# Patient Record
Sex: Male | Born: 1940 | Race: White | Hispanic: No | Marital: Married | Smoking: Never smoker
Health system: Southern US, Community
[De-identification: ages and names within clinical notes are randomized; demographics above are authoritative.]

## PROBLEM LIST (undated history)

## (undated) DIAGNOSIS — I428 Other cardiomyopathies: Secondary | ICD-10-CM

## (undated) DIAGNOSIS — C801 Malignant (primary) neoplasm, unspecified: Secondary | ICD-10-CM

## (undated) DIAGNOSIS — K429 Umbilical hernia without obstruction or gangrene: Secondary | ICD-10-CM

## (undated) DIAGNOSIS — Z923 Personal history of irradiation: Secondary | ICD-10-CM

## (undated) DIAGNOSIS — F40240 Claustrophobia: Secondary | ICD-10-CM

## (undated) DIAGNOSIS — G4733 Obstructive sleep apnea (adult) (pediatric): Secondary | ICD-10-CM

## (undated) DIAGNOSIS — I499 Cardiac arrhythmia, unspecified: Secondary | ICD-10-CM

## (undated) DIAGNOSIS — Z9289 Personal history of other medical treatment: Secondary | ICD-10-CM

## (undated) DIAGNOSIS — B029 Zoster without complications: Secondary | ICD-10-CM

## (undated) DIAGNOSIS — I509 Heart failure, unspecified: Secondary | ICD-10-CM

## (undated) DIAGNOSIS — I4891 Unspecified atrial fibrillation: Secondary | ICD-10-CM

## (undated) DIAGNOSIS — I879 Disorder of vein, unspecified: Secondary | ICD-10-CM

## (undated) DIAGNOSIS — E785 Hyperlipidemia, unspecified: Secondary | ICD-10-CM

## (undated) DIAGNOSIS — I519 Heart disease, unspecified: Secondary | ICD-10-CM

## (undated) DIAGNOSIS — R413 Other amnesia: Secondary | ICD-10-CM

## (undated) DIAGNOSIS — Z95810 Presence of automatic (implantable) cardiac defibrillator: Secondary | ICD-10-CM

## (undated) HISTORY — DX: Other cardiomyopathies: I42.8

## (undated) HISTORY — PX: CYST EXCISION: SHX5701

## (undated) HISTORY — DX: Unspecified atrial fibrillation: I48.91

## (undated) HISTORY — DX: Obstructive sleep apnea (adult) (pediatric): G47.33

## (undated) HISTORY — PX: CATARACT EXTRACTION: SUR2

## (undated) HISTORY — DX: Hyperlipidemia, unspecified: E78.5

## (undated) HISTORY — DX: Heart disease, unspecified: I51.9

## (undated) HISTORY — PX: HERNIA REPAIR: SHX51

## (undated) HISTORY — PX: EYE SURGERY: SHX253

## (undated) HISTORY — DX: Personal history of other medical treatment: Z92.89

## (undated) HISTORY — PX: MOUTH SURGERY: SHX715

---

## 1996-09-06 DIAGNOSIS — I428 Other cardiomyopathies: Secondary | ICD-10-CM

## 1996-09-06 HISTORY — DX: Other cardiomyopathies: I42.8

## 2003-09-15 LAB — HM COLONOSCOPY

## 2004-09-06 HISTORY — PX: CARDIAC DEFIBRILLATOR PLACEMENT: SHX171

## 2010-12-14 ENCOUNTER — Ambulatory Visit (INDEPENDENT_AMBULATORY_CARE_PROVIDER_SITE_OTHER): Payer: Medicare PPO | Admitting: Family Medicine

## 2010-12-14 ENCOUNTER — Encounter: Payer: Self-pay | Admitting: Family Medicine

## 2010-12-14 DIAGNOSIS — Z299 Encounter for prophylactic measures, unspecified: Secondary | ICD-10-CM

## 2010-12-14 DIAGNOSIS — I4891 Unspecified atrial fibrillation: Secondary | ICD-10-CM

## 2010-12-14 DIAGNOSIS — E782 Mixed hyperlipidemia: Secondary | ICD-10-CM | POA: Insufficient documentation

## 2010-12-14 DIAGNOSIS — I428 Other cardiomyopathies: Secondary | ICD-10-CM

## 2010-12-14 DIAGNOSIS — I4819 Other persistent atrial fibrillation: Secondary | ICD-10-CM | POA: Insufficient documentation

## 2010-12-14 DIAGNOSIS — I480 Paroxysmal atrial fibrillation: Secondary | ICD-10-CM | POA: Insufficient documentation

## 2010-12-14 DIAGNOSIS — E785 Hyperlipidemia, unspecified: Secondary | ICD-10-CM

## 2010-12-14 DIAGNOSIS — E119 Type 2 diabetes mellitus without complications: Secondary | ICD-10-CM

## 2010-12-14 DIAGNOSIS — Z23 Encounter for immunization: Secondary | ICD-10-CM

## 2010-12-14 LAB — HEPATIC FUNCTION PANEL
ALT: 34 U/L (ref 0–53)
AST: 23 U/L (ref 0–37)
Albumin: 3.8 g/dL (ref 3.5–5.2)
Alkaline Phosphatase: 88 U/L (ref 39–117)
Bilirubin, Direct: 0.1 mg/dL (ref 0.0–0.3)
Total Bilirubin: 0.9 mg/dL (ref 0.3–1.2)
Total Protein: 6.6 g/dL (ref 6.0–8.3)

## 2010-12-14 LAB — LIPID PANEL
Cholesterol: 141 mg/dL (ref 0–200)
HDL: 50.2 mg/dL (ref >39.00)
LDL Cholesterol: 60 mg/dL (ref 0–99)
Total CHOL/HDL Ratio: 3
Triglycerides: 154 mg/dL — ABNORMAL HIGH (ref 0.0–149.0)
VLDL: 30.8 mg/dL (ref 0.0–40.0)

## 2010-12-14 LAB — BASIC METABOLIC PANEL
BUN: 16 mg/dL (ref 6–23)
CO2: 29 mEq/L (ref 19–32)
Calcium: 9 mg/dL (ref 8.4–10.5)
Chloride: 99 mEq/L (ref 96–112)
Creatinine, Ser: 0.9 mg/dL (ref 0.4–1.5)
GFR: 91.04 mL/min (ref >60.00)
Glucose, Bld: 90 mg/dL (ref 70–99)
Potassium: 4.5 mEq/L (ref 3.5–5.1)
Sodium: 136 mEq/L (ref 135–145)

## 2010-12-14 LAB — POCT INR: INR: 1.5

## 2010-12-14 LAB — HEMOGLOBIN A1C: Hgb A1c MFr Bld: 6.6 % — ABNORMAL HIGH (ref 4.6–6.5)

## 2010-12-14 MED ORDER — PNEUMOCOCCAL VAC POLYVALENT 25 MCG/0.5ML IJ INJ: 0.5000 mL | INJECTION | Freq: Once | INTRAMUSCULAR | Status: DC

## 2010-12-14 MED ORDER — DIGOXIN 250 MCG PO TABS: 250.0000 ug | ORAL_TABLET | Freq: Every day | ORAL | Status: DC

## 2010-12-14 MED ORDER — METFORMIN HCL 500 MG PO TABS: 500.0000 mg | ORAL_TABLET | Freq: Two times a day (BID) | ORAL | Status: DC

## 2010-12-14 MED ORDER — CARVEDILOL 25 MG PO TABS: 25.0000 mg | ORAL_TABLET | Freq: Two times a day (BID) | ORAL | Status: DC

## 2010-12-14 MED ORDER — ATORVASTATIN CALCIUM 80 MG PO TABS: 80.0000 mg | ORAL_TABLET | Freq: Every day | ORAL | Status: DC

## 2010-12-14 MED ORDER — GEMFIBROZIL 600 MG PO TABS: 600.0000 mg | ORAL_TABLET | Freq: Two times a day (BID) | ORAL | Status: DC

## 2010-12-14 NOTE — Patient Instructions (Signed)
Coumadin dose-7.5mg . Monday and Friday. All other days 5mg .

## 2010-12-14 NOTE — Progress Notes (Signed)
Subjective:    Patient ID: Collin [name], male    DOB: 1941-05-15, 70 y.o.   MRN: [id number]  HPI The patient seen to establish care. He and wife just moved here from [place]. Past medical history reviewed. He has what sounds like idiopathic nonischemic cardiomyopathy diagnosed about 15 years ago and now well compensated on medical regimen. Also chronic atrial fibrillation rate controlled on Coumadin. According to wife he has not had INR since November of 2011. No recent bleeding complications. Medications are all reviewed and as recorded.  Had one episode a few months ago with what sounds like orthostasis. Recommendation to reduce his spirinolactone which never did. No dizziness since then. Denies any chest pains. Occasional dyspnea with exertion. No history of CAD.  Type 2 diabetes. Takes metformin. Last A1c 7.1% but not checked recently. No symptoms of hyper or hypoglycemia. No recent lab work. No history of recorded Pneumovax. Has not yet established with local cardiologist.   Review of Systems  Constitutional: Negative for fever, chills, activity change and fatigue.  Respiratory: Negative for cough, shortness of breath and wheezing.   Cardiovascular: Negative for chest pain and palpitations.  Gastrointestinal: Negative for abdominal pain.  Genitourinary: Negative for dysuria and hematuria.  Hematological: Negative for adenopathy. Does not bruise/bleed easily.  Psychiatric/Behavioral: Negative for dysphoric mood.       Objective:   Physical Exam  Constitutional: He is oriented to person, place, and time. He appears well-developed and well-nourished. No distress.  HENT:  Head: Normocephalic and atraumatic.  Right Ear: External ear normal.  Left Ear: External ear normal.  Neck: No thyromegaly present.  Cardiovascular: Normal rate, regular rhythm and normal heart sounds.  Exam reveals no gallop.   Pulmonary/Chest: Effort normal and breath sounds normal. He has no wheezes. He has  no rales.  Abdominal: Soft. Bowel sounds are normal. There is no rebound.  Musculoskeletal: He exhibits edema.  Lymphadenopathy:    He has no cervical adenopathy.  Neurological: He is alert and oriented to person, place, and time. No cranial nerve deficit.  Skin: No rash noted.  Psychiatric: He has a normal mood and affect. His behavior is normal.          Assessment & Plan:  #1 history of nonischemic cardiomyopathy symptomatically stable #2 type 2 diabetes #3 dyslipidemia #4 chronic atrial fibrillation #5 health maintenance.  No hx of PVX and after counseling he consents and given.  Patient will need to establish with local cardiologist and referral will be made. Check labs today including A1c, lipid panel, hepatic panel, basic metabolic panel, and INR. Reduce spirinolactone to one tablet daily.

## 2010-12-15 ENCOUNTER — Telehealth: Payer: Self-pay | Admitting: *Deleted

## 2010-12-15 NOTE — Telephone Encounter (Signed)
Received PC from pt wife to report the generic Lipitor was going to cost them $400.00, no income, no insurance, only medicare.  Requesting Pravastatin as it should be a lot less expensive per her research.  If OK send to [facility]

## 2010-12-16 MED ORDER — PRAVASTATIN SODIUM 40 MG PO TABS: 40.0000 mg | ORAL_TABLET | Freq: Every evening | ORAL | Status: DC

## 2010-12-16 NOTE — Progress Notes (Signed)
Quick Note:  Pt informed ______ 

## 2010-12-16 NOTE — Telephone Encounter (Signed)
Rx sent, pt wife informed of need for repeat labs in 2-3 months

## 2010-12-16 NOTE — Telephone Encounter (Signed)
We can switch to Pravastatin 40 mg po daily, but we will see less efficacy.  We would need to check lipid and liver panel in 2-3 months after change.

## 2010-12-25 ENCOUNTER — Encounter: Payer: Self-pay | Admitting: Family Medicine

## 2010-12-28 ENCOUNTER — Ambulatory Visit (INDEPENDENT_AMBULATORY_CARE_PROVIDER_SITE_OTHER): Payer: Medicare PPO | Admitting: Family Medicine

## 2010-12-28 DIAGNOSIS — I4891 Unspecified atrial fibrillation: Secondary | ICD-10-CM

## 2010-12-28 DIAGNOSIS — Z0289 Encounter for other administrative examinations: Secondary | ICD-10-CM

## 2010-12-28 LAB — POCT INR: INR: 2

## 2010-12-28 NOTE — Patient Instructions (Signed)
Same dose,7.5 mg on mondays and fridays, check in 4 weeks

## 2011-01-04 ENCOUNTER — Ambulatory Visit (INDEPENDENT_AMBULATORY_CARE_PROVIDER_SITE_OTHER): Payer: Medicare PPO | Admitting: *Deleted

## 2011-01-04 ENCOUNTER — Ambulatory Visit (INDEPENDENT_AMBULATORY_CARE_PROVIDER_SITE_OTHER): Payer: Medicare PPO | Admitting: Internal Medicine

## 2011-01-04 DIAGNOSIS — E785 Hyperlipidemia, unspecified: Secondary | ICD-10-CM

## 2011-01-04 DIAGNOSIS — I428 Other cardiomyopathies: Secondary | ICD-10-CM

## 2011-01-04 DIAGNOSIS — I509 Heart failure, unspecified: Secondary | ICD-10-CM

## 2011-01-04 DIAGNOSIS — I4891 Unspecified atrial fibrillation: Secondary | ICD-10-CM

## 2011-01-04 DIAGNOSIS — E7800 Pure hypercholesterolemia, unspecified: Secondary | ICD-10-CM

## 2011-01-04 MED ORDER — NIACIN ER (ANTIHYPERLIPIDEMIC) 500 MG PO TBCR: 500.0000 mg | EXTENDED_RELEASE_TABLET | Freq: Every day | ORAL | Status: DC

## 2011-01-04 MED ORDER — PRAVASTATIN SODIUM 40 MG PO TABS: 40.0000 mg | ORAL_TABLET | Freq: Every evening | ORAL | Status: DC

## 2011-01-04 NOTE — Progress Notes (Signed)
icd check

## 2011-01-04 NOTE — Patient Instructions (Signed)
Schedule appointment with Dr.Taylor in 3 months for AICD check  Fasting lab work in 3 months  Decrease Niaspan to 500 mg every day  Schedule appointment with Dr.Ross in 6 months

## 2011-01-05 NOTE — Progress Notes (Signed)
HPI Patient is a 70 year old who presents for continued cardiac care.  He moved to [place] in 2011 from [place]. The patient has a history of CHF.  He was diagnosed in 2002.  Catheterization at that time revealed normal coronary arteries.  LVEF was 28%.  He had in AICD place.  He was managed medically.  Per report he had normalization of his LVEF though I do not have record of this. He also has a history of sleep apnea.  He has not used CPAP. On talking to the patient he denies chest pains.  Breathing is OK.  He does have problems with his L ankle turning in when he walks.  No Known Allergies  Current Outpatient Prescriptions  Medication Sig Dispense Refill  . carvedilol (COREG) 25 MG tablet Take 1 tablet (25 mg total) by mouth 2 (two) times daily with a meal.  180 tablet  3  . digoxin (LANOXIN) 0.25 MG tablet Take 1 tablet (250 mcg total) by mouth daily.  90 tablet  3  . gemfibrozil (LOPID) 600 MG tablet Take 1 tablet (600 mg total) by mouth 2 (two) times daily before a meal.  180 tablet  3  . metFORMIN (GLUCOPHAGE) 500 MG tablet Take 1 tablet (500 mg total) by mouth 2 (two) times daily with a meal.  180 tablet  3  . pravastatin (PRAVACHOL) 40 MG tablet Take 1 tablet (40 mg total) by mouth every evening.  90 tablet  3  . ramipril (ALTACE) 5 MG capsule Take 5 mg by mouth daily.        Marland Kitchen spironolactone (ALDACTONE) 25 MG tablet Take 25 mg by mouth daily.       Marland Kitchen warfarin (COUMADIN) 5 MG tablet Take 5 mg by mouth daily.        . niacin (NIASPAN) 500 MG CR tablet Take 1 tablet (500 mg total) by mouth at bedtime.  90 tablet  3   Current Facility-Administered Medications  Medication Dose Route Frequency Provider Last Rate Last Dose  . pneumococcal 23 valent vaccine (PNU-IMMUNE) injection 0.5 mL  0.5 mL Intramuscular Once [name]        Past Medical History  Diagnosis Date  . Diabetes mellitus   . Heart disease   . Hyperlipidemia   . Cardiomyopathy, nonischemic 1998  . Atrial  fibrillation   . OSA (obstructive sleep apnea)     Past Surgical History  Procedure Date  . Cardiac defibrillator placement 2006    Family History  Problem Relation Age of Onset  . Heart disease Mother   . Sudden death Mother     under age 9  . Lupus Mother   . Hypertension Father   . Stroke Father     History   Social History  . Marital Status: Married    Spouse Name: N/A    Number of Children: N/A  . Years of Education: N/A   Occupational History  . Not on file.   Social History Main Topics  . Smoking status: Never Smoker   . Smokeless tobacco: Not on file  . Alcohol Use: Not on file  . Drug Use: Not on file  . Sexually Active: Not on file   Other Topics Concern  . Not on file   Social History Narrative  . No narrative on file    Review of Systems:  All systems reviewed.  They are negative to the above problem except as previously stated.  Vital Signs: BP 107/68  Pulse 76  Ht 6' (1.829 m)  Wt 294 lb (133.358 kg)  BMI 39.87 kg/m2 Patient is an obese 70 year old in NAD   Physical Exam  HEENT:  Normocephalic, atraumatic. EOMI, PERRLA.  Neck: JVP is normal. No thyromegaly. No bruits.  Lungs: clear to auscultation. No rales no wheezes.  Heart: Irregular rate and rhythm. Normal S1, S2. No S3.   No significant murmurs. PMI not displaced.  Abdomen:  Supple, nontender. Normal bowel sounds. No masses. No hepatomegaly.  Extremities:   Good distal pulses throughout. Tr to 1+ lower extremity edema (L > R)  Musculoskeletal :moving all extremities.  Neuro:   alert and oriented x3.  CN II-XII grossly intact.  EKG:  Atrial fibrillation   77 bpm. Pssible inf MI.   Assessment and Plan:

## 2011-01-05 NOTE — Assessment & Plan Note (Signed)
LDL is 60, HDL is 50.  Trig are 154.  I would back down on Niaspan to 500 mg per day.  F/U lipids in 4 months.

## 2011-01-05 NOTE — Assessment & Plan Note (Signed)
Volume status looks good.  I would keep him on the same regimen.  Schedule echo.

## 2011-01-05 NOTE — Assessment & Plan Note (Signed)
Continue on rate control and coumadin.  INR is checked at Dr. Mar Daring office.

## 2011-01-14 NOTE — Progress Notes (Signed)
Lipids are due in 4 months.

## 2011-01-26 ENCOUNTER — Ambulatory Visit (HOSPITAL_COMMUNITY): Payer: Medicare PPO | Attending: Internal Medicine | Admitting: Radiology

## 2011-01-26 DIAGNOSIS — I079 Rheumatic tricuspid valve disease, unspecified: Secondary | ICD-10-CM | POA: Insufficient documentation

## 2011-01-26 DIAGNOSIS — I509 Heart failure, unspecified: Secondary | ICD-10-CM | POA: Insufficient documentation

## 2011-01-26 DIAGNOSIS — E785 Hyperlipidemia, unspecified: Secondary | ICD-10-CM | POA: Insufficient documentation

## 2011-01-26 DIAGNOSIS — I059 Rheumatic mitral valve disease, unspecified: Secondary | ICD-10-CM | POA: Insufficient documentation

## 2011-01-26 DIAGNOSIS — E119 Type 2 diabetes mellitus without complications: Secondary | ICD-10-CM | POA: Insufficient documentation

## 2011-01-27 ENCOUNTER — Ambulatory Visit (INDEPENDENT_AMBULATORY_CARE_PROVIDER_SITE_OTHER): Payer: Medicare PPO | Admitting: Family Medicine

## 2011-01-27 ENCOUNTER — Other Ambulatory Visit: Payer: Self-pay | Admitting: *Deleted

## 2011-01-27 ENCOUNTER — Other Ambulatory Visit: Payer: Self-pay | Admitting: Internal Medicine

## 2011-01-27 DIAGNOSIS — I428 Other cardiomyopathies: Secondary | ICD-10-CM

## 2011-01-27 DIAGNOSIS — I4891 Unspecified atrial fibrillation: Secondary | ICD-10-CM

## 2011-01-27 DIAGNOSIS — Z0289 Encounter for other administrative examinations: Secondary | ICD-10-CM

## 2011-01-27 LAB — POCT INR: INR: 4.3

## 2011-01-27 MED ORDER — DIGOXIN 250 MCG PO TABS: 250.0000 ug | ORAL_TABLET | Freq: Every day | ORAL | Status: DC

## 2011-01-27 MED ORDER — CARVEDILOL 25 MG PO TABS: 25.0000 mg | ORAL_TABLET | Freq: Two times a day (BID) | ORAL | Status: DC

## 2011-01-27 NOTE — Patient Instructions (Signed)
Hold tomorrow only. Then take 7.5mg . Mon. Only. All other days 5mg .

## 2011-01-27 NOTE — Telephone Encounter (Signed)
Rf carvedilol 25mg  bid, digoxin .025mg  1qd to [facility].

## 2011-01-28 ENCOUNTER — Other Ambulatory Visit: Payer: Self-pay | Admitting: *Deleted

## 2011-01-28 DIAGNOSIS — E785 Hyperlipidemia, unspecified: Secondary | ICD-10-CM

## 2011-01-28 DIAGNOSIS — I428 Other cardiomyopathies: Secondary | ICD-10-CM

## 2011-01-28 MED ORDER — CARVEDILOL 25 MG PO TABS: 25.0000 mg | ORAL_TABLET | Freq: Two times a day (BID) | ORAL | Status: DC

## 2011-01-28 MED ORDER — DIGOXIN 250 MCG PO TABS: 250.0000 ug | ORAL_TABLET | Freq: Every day | ORAL | Status: DC

## 2011-01-28 NOTE — Telephone Encounter (Signed)
Opened in error

## 2011-01-28 NOTE — Telephone Encounter (Signed)
Rx filled

## 2011-02-10 ENCOUNTER — Ambulatory Visit (INDEPENDENT_AMBULATORY_CARE_PROVIDER_SITE_OTHER): Payer: Medicare PPO | Admitting: Family Medicine

## 2011-02-10 DIAGNOSIS — I4891 Unspecified atrial fibrillation: Secondary | ICD-10-CM

## 2011-02-10 LAB — POCT INR: INR: 3.7

## 2011-02-10 NOTE — Patient Instructions (Signed)
Hold today only. Then take 5mg . Every day Check 4 weeks

## 2011-03-12 ENCOUNTER — Ambulatory Visit (INDEPENDENT_AMBULATORY_CARE_PROVIDER_SITE_OTHER): Payer: Medicare PPO | Admitting: Family Medicine

## 2011-03-12 DIAGNOSIS — I4891 Unspecified atrial fibrillation: Secondary | ICD-10-CM

## 2011-03-12 LAB — POCT INR: INR: 3.3

## 2011-03-16 ENCOUNTER — Other Ambulatory Visit: Payer: Self-pay | Admitting: Family Medicine

## 2011-03-16 DIAGNOSIS — E785 Hyperlipidemia, unspecified: Secondary | ICD-10-CM

## 2011-03-16 DIAGNOSIS — E119 Type 2 diabetes mellitus without complications: Secondary | ICD-10-CM

## 2011-03-16 DIAGNOSIS — E7800 Pure hypercholesterolemia, unspecified: Secondary | ICD-10-CM

## 2011-03-16 MED ORDER — GEMFIBROZIL 600 MG PO TABS: 600.0000 mg | ORAL_TABLET | Freq: Two times a day (BID) | ORAL | Status: DC

## 2011-03-16 MED ORDER — METFORMIN HCL 500 MG PO TABS: 500.0000 mg | ORAL_TABLET | Freq: Two times a day (BID) | ORAL | Status: DC

## 2011-03-16 MED ORDER — RAMIPRIL 5 MG PO CAPS: 5.0000 mg | ORAL_CAPSULE | Freq: Every day | ORAL | Status: DC

## 2011-03-16 NOTE — Telephone Encounter (Signed)
Refill metformin 500mg  bid, gemfibrozil 600mg  bid, Ramipril 5mg  qd. Please send to [facility] mail order.

## 2011-03-30 ENCOUNTER — Other Ambulatory Visit (INDEPENDENT_AMBULATORY_CARE_PROVIDER_SITE_OTHER): Payer: Medicare PPO | Admitting: *Deleted

## 2011-03-30 ENCOUNTER — Encounter: Payer: Self-pay | Admitting: Internal Medicine

## 2011-03-30 ENCOUNTER — Ambulatory Visit (INDEPENDENT_AMBULATORY_CARE_PROVIDER_SITE_OTHER): Payer: Medicare PPO | Admitting: Internal Medicine

## 2011-03-30 DIAGNOSIS — I428 Other cardiomyopathies: Secondary | ICD-10-CM

## 2011-03-30 DIAGNOSIS — Z95810 Presence of automatic (implantable) cardiac defibrillator: Secondary | ICD-10-CM

## 2011-03-30 DIAGNOSIS — I4891 Unspecified atrial fibrillation: Secondary | ICD-10-CM

## 2011-03-30 DIAGNOSIS — E7800 Pure hypercholesterolemia, unspecified: Secondary | ICD-10-CM

## 2011-03-30 DIAGNOSIS — I509 Heart failure, unspecified: Secondary | ICD-10-CM

## 2011-03-30 DIAGNOSIS — I5032 Chronic diastolic (congestive) heart failure: Secondary | ICD-10-CM | POA: Insufficient documentation

## 2011-03-30 DIAGNOSIS — E785 Hyperlipidemia, unspecified: Secondary | ICD-10-CM

## 2011-03-30 LAB — LIPID PANEL
Cholesterol: 171 mg/dL (ref 0–200)
HDL: 44 mg/dL (ref >39.00)
Total CHOL/HDL Ratio: 4
Triglycerides: 243 mg/dL — ABNORMAL HIGH (ref 0.0–149.0)
VLDL: 48.6 mg/dL — ABNORMAL HIGH (ref 0.0–40.0)

## 2011-03-30 LAB — LDL CHOLESTEROL, DIRECT: Direct LDL: 90.2 mg/dL

## 2011-03-30 LAB — ICD DEVICE OBSERVATION
AL IMPEDENCE ICD: 3000 Ohm
ATRIAL PACING ICD: 0 pct
BATTERY VOLTAGE: 2.58 V
CHARGE TIME: 12.8 s
DEV-0020ICD: NEGATIVE
DEVICE MODEL ICD: 202844
FVT: 0
HV IMPEDENCE: 43 Ohm
LV LEAD IMPEDENCE ICD: 0 Ohm
PACEART VT: 0
RV LEAD AMPLITUDE: 12.5 mv
RV LEAD IMPEDENCE ICD: 549 Ohm
RV LEAD THRESHOLD: 0.6 V
RV LEAD THRESHOLD: 0.6 V
RV LEAD THRESHOLD: 0.8 V
RV LEAD THRESHOLD: 1.6 V
TOT-0001: 0
TOT-0002: 0
TOT-0006: 20120430000000
VENTRICULAR PACING ICD: 10 pct
VF: 0

## 2011-03-30 LAB — AST: AST: 31 U/L (ref 0–37)

## 2011-03-30 NOTE — Assessment & Plan Note (Signed)
He is currently asymptomatic. He will continue his current medications.

## 2011-03-30 NOTE — Progress Notes (Signed)
HPI Collin [name] is referred today by Dr. Tenny Craw for ongoing ICD evaluation and management. 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 At that time he underwent ICD implantation. He has a history of sleep apnea. He has a history of chronic systolic heart failure. Repeat echocardiography demonstrated near normalization of his left ventricular function. The patient denies chest pain or shortness of breath. He does note peripheral edema. He admits to drinking lots of liquids during the day. He admits to some dietary indiscretion with sodium. He denies medical noncompliance. No Known Allergies   Current Outpatient Prescriptions  Medication Sig Dispense Refill  . carvedilol (COREG) 25 MG tablet Take 1 tablet (25 mg total) by mouth 2 (two) times daily with a meal.  180 tablet  3  . digoxin (LANOXIN) 0.25 MG tablet Take 1 tablet (250 mcg total) by mouth daily.  90 tablet  3  . gemfibrozil (LOPID) 600 MG tablet Take 1 tablet (600 mg total) by mouth 2 (two) times daily before a meal.  180 tablet  3  . metFORMIN (GLUCOPHAGE) 500 MG tablet Take 1 tablet (500 mg total) by mouth 2 (two) times daily with a meal.  180 tablet  3  . niacin (NIASPAN) 500 MG CR tablet Take 1 tablet (500 mg total) by mouth at bedtime.  90 tablet  3  . pravastatin (PRAVACHOL) 40 MG tablet Take 1 tablet (40 mg total) by mouth every evening.  90 tablet  3  . ramipril (ALTACE) 5 MG capsule Take 1 capsule (5 mg total) by mouth daily.  90 capsule  3  . spironolactone (ALDACTONE) 25 MG tablet Take 25 mg by mouth daily.       Marland Kitchen warfarin (COUMADIN) 5 MG tablet Take 5 mg by mouth daily.         Current Facility-Administered Medications  Medication Dose Route Frequency Provider Last Rate Last Dose  . pneumococcal 23 valent vaccine (PNU-IMMUNE) injection 0.5 mL  0.5 mL Intramuscular Once [name]         Past Medical History  Diagnosis Date  . Diabetes mellitus   . Heart disease   . Hyperlipidemia   .  Cardiomyopathy, nonischemic 1998  . Atrial fibrillation   . OSA (obstructive sleep apnea)     ROS:   All systems reviewed and negative except as noted in the HPI.   Past Surgical History  Procedure Date  . Cardiac defibrillator placement 2006     Family History  Problem Relation Age of Onset  . Heart disease Mother   . Sudden death Mother     under age 56  . Lupus Mother   . Hypertension Father   . Stroke Father      History   Social History  . Marital Status: Married    Spouse Name: N/A    Number of Children: N/A  . Years of Education: N/A   Occupational History  . Not on file.   Social History Main Topics  . Smoking status: Never Smoker   . Smokeless tobacco: Not on file  . Alcohol Use: Not on file  . Drug Use: Not on file  . Sexually Active: Not on file   Other Topics Concern  . Not on file   Social History Narrative  . No narrative on file     BP 122/76  Pulse 76  Resp 14  Ht 6' (1.829 m)  Wt 291 lb (131.997 kg)  BMI 39.47 kg/m2  Physical Exam:  Obese, well appearing man, NAD HEENT: Unremarkable Neck:  No JVD, no thyromegally Lymphatics:  No adenopathy Back:  No CVA tenderness Lungs:  Clear, well-healed ICD incision. HEART:  Regular rate rhythm, no murmurs, no rubs, no clicks Abd:  Soft, obese, positive bowel sounds, no organomegally, no rebound, no guarding Ext:  2 plus pulses, no edema, no cyanosis, no clubbing Skin:  No rashes no nodules Neuro:  CN II through XII intact, motor grossly intact   DEVICE  Normal device function.  See PaceArt for details.    Assess/Plan:

## 2011-03-30 NOTE — Patient Instructions (Signed)
Your physician recommends that you schedule a follow-up appointment in: 3 months with device clinica nd 6 months with Dr [name]  Your physician recommends that you continue on your current medications as directed. Please refer to the Current Medication list given to you today.

## 2011-03-30 NOTE — Assessment & Plan Note (Signed)
He has class 1-2 symptoms. He will continue to improve his compliance with diet and sodium intake. He admits to drinking water liberally. I've encouraged him to drink only when he is thirsty and really maintain a low-sodium diet. He will continue his current medical therapy

## 2011-03-30 NOTE — Assessment & Plan Note (Signed)
His device is working normally built he is approaching elective replacement. With normalization or near normalization of his left ventricular function, the decision will need to be made as to whether to replace his device or not. We'll discuss more when he returns for followup.

## 2011-03-30 NOTE — Assessment & Plan Note (Signed)
He is instructed to maintain a low-fat diet and continue his cholesterol lowering medications.

## 2011-04-09 ENCOUNTER — Ambulatory Visit (INDEPENDENT_AMBULATORY_CARE_PROVIDER_SITE_OTHER): Payer: Medicare PPO | Admitting: Family Medicine

## 2011-04-09 DIAGNOSIS — I4891 Unspecified atrial fibrillation: Secondary | ICD-10-CM

## 2011-04-09 LAB — POCT INR: INR: 3.3

## 2011-04-09 NOTE — Patient Instructions (Signed)
Take 2.5 mg mondays and wednesdays Then take 5mg . every day Check in 4 weeks

## 2011-04-15 ENCOUNTER — Ambulatory Visit (INDEPENDENT_AMBULATORY_CARE_PROVIDER_SITE_OTHER): Payer: Medicare PPO | Admitting: Family Medicine

## 2011-04-15 ENCOUNTER — Encounter: Payer: Self-pay | Admitting: Family Medicine

## 2011-04-15 DIAGNOSIS — R5381 Other malaise: Secondary | ICD-10-CM

## 2011-04-15 DIAGNOSIS — G4733 Obstructive sleep apnea (adult) (pediatric): Secondary | ICD-10-CM

## 2011-04-15 DIAGNOSIS — R5383 Other fatigue: Secondary | ICD-10-CM

## 2011-04-15 DIAGNOSIS — E119 Type 2 diabetes mellitus without complications: Secondary | ICD-10-CM

## 2011-04-15 DIAGNOSIS — I4891 Unspecified atrial fibrillation: Secondary | ICD-10-CM

## 2011-04-15 DIAGNOSIS — M722 Plantar fascial fibromatosis: Secondary | ICD-10-CM

## 2011-04-15 LAB — TSH: TSH: 1.22 u[IU]/mL (ref 0.35–5.50)

## 2011-04-15 LAB — HEMOGLOBIN A1C: Hgb A1c MFr Bld: 6.6 % — ABNORMAL HIGH (ref 4.6–6.5)

## 2011-04-15 NOTE — Progress Notes (Signed)
Subjective:    Patient ID: Collin [name], male    DOB: 03-23-1941, 70 y.o.   MRN: [id number]  HPI Chronic problems of atrial fibrillation, nonischemic cardiomyopathy, type 2 diabetes, hyperlipidemia, and history of CHF.  Type 2 diabetes. Recent A1c 6.6%. Patient rarely checks blood sugars. No symptoms of hyperglycemia. No dilated eye exam about 2 years now. No history of retinopathy. No neuropathy symptoms.  New symptom of left plantar fascia pain for about 5 weeks. No injury. No change of shoe wear. Pain worse after prolonged periods of sitting. In some mild stretching and also insole support which has helped somewhat.  Chronic Coumadin. Followed by Coumadin clinic. No bleeding complications.   Review of Systems  Respiratory: Negative for cough, chest tightness and shortness of breath.   Cardiovascular: Negative for chest pain and palpitations.  Gastrointestinal: Negative for abdominal pain.  Genitourinary: Negative for dysuria and decreased urine volume.  Musculoskeletal: Negative for myalgias.  Neurological: Negative for headaches.  Hematological: Does not bruise/bleed easily.  Psychiatric/Behavioral: Negative for dysphoric mood.       Objective:   Physical Exam  Constitutional: He appears well-developed and well-nourished.  Cardiovascular: Normal rate.        Irregularly irregular rhythm  Pulmonary/Chest: Effort normal and breath sounds normal. No respiratory distress. He has no wheezes. He has no rales.  Musculoskeletal: He exhibits no edema.       Tender left plantar fascia. No Achilles tenderness. Good distal foot pulses  Feet reveal no skin lesions. Good distal foot pulses. Good capillary refill. No calluses. Normal sensation with monofilament testing           Assessment & Plan:  #1 left plantar fasciitis. Discussed judicious use of icing and stretches. Continued insole support.  Would try to avoid injections with coumadin use. #2 type 2 diabetes. Recheck A1c.  If showing good control this time we'll plan six-month follow up.

## 2011-04-15 NOTE — Patient Instructions (Signed)
Set up eye exam  Plantar Fasciitis Plantar fasciitis is a common condition that causes foot pain. It is soreness (inflammation) of the band of tough fibrous tissue on the bottom of the foot that runs from the heel bone (calcaneus) to the ball of the foot. The cause of this soreness may be from excessive standing, poor fitting shoes, running on hard surfaces, being overweight, having an abnormal walk, or overuse (this is common in runners) of the painful foot or feet. It is also common in aerobic exercise dancers and ballet dancers.  SYMPTOMS Most people with plantar fasciitis complain of:  Severe pain in the morning on the bottom of their foot especially when taking the first steps out of bed. This pain recedes after a few minutes of walking.   Severe pain is experienced also during walking following a long period of inactivity.   Pain is worse when walking barefoot or up stairs  DIAGNOSIS  Your caregiver will diagnose this condition by examining and feeling your foot.   Special tests such as x-rays of your foot, are usually not needed.  PREVENTION  Consult a sports medicine professional before beginning a new exercise program.   Walking programs offer a good workout. With walking there is a lower chance of overuse injuries common to runners. There is less impact and less jarring of the joints.   Begin all new exercise programs slowly. If problems or pain develop, decrease the amount of time or distance until you are at a comfortable level.   Wear good shoes and replace them regularly.   Stretch your foot and the heel cords at the back of the ankle (Achilles tendon) both before and after exercise.   Run or exercise on even surfaces that are not hard. For example, asphalt is better than pavement.   Do not run barefoot on hard surfaces.   If using a treadmill, vary the incline.   Do not continue to workout if you have foot or joint problems. Seek professional help if they do not  improve.  HOME CARE INSTRUCTIONS  Avoid activities that cause you pain until you recover.   Use ice or cold packs on the problem or painful areas after working out.   Only take over-the-counter or prescription medicines for pain, discomfort, or fever as directed by your caregiver.   Soft shoe inserts or athletic shoes with air or gel sole cushions may be helpful.   If problems continue or become more severe, consult a sports medicine caregiver or your own health care provider. Cortisone is a potent anti-inflammatory medication that may be injected into the painful area. You can discuss this treatment with your caregiver.  MAKE SURE YOU:  Understand these instructions.   Will watch your condition.   Will get help right away if you are not doing well or get worse.  Document Released: 05/18/2001 Document Re-Released: 11/17/2009 [facility] Patient Information 2011 [place], [place].

## 2011-04-16 NOTE — Progress Notes (Signed)
Quick Note:  Pt informed ______ 

## 2011-05-07 ENCOUNTER — Ambulatory Visit (INDEPENDENT_AMBULATORY_CARE_PROVIDER_SITE_OTHER): Payer: Medicare PPO | Admitting: Family Medicine

## 2011-05-07 DIAGNOSIS — I4891 Unspecified atrial fibrillation: Secondary | ICD-10-CM

## 2011-05-07 LAB — POCT INR: INR: 2.3

## 2011-05-07 NOTE — Patient Instructions (Signed)
Same dose 

## 2011-06-03 ENCOUNTER — Telehealth: Payer: Self-pay | Admitting: Family Medicine

## 2011-06-03 MED ORDER — SPIRONOLACTONE 25 MG PO TABS: 25.0000 mg | ORAL_TABLET | Freq: Every day | ORAL | Status: DC

## 2011-06-03 NOTE — Telephone Encounter (Signed)
Pt requesting refill on    spironolactone (ALDACTONE) 25 MG tablet    Send to [facility]

## 2011-06-04 ENCOUNTER — Ambulatory Visit: Payer: Medicare PPO

## 2011-06-04 DIAGNOSIS — I4891 Unspecified atrial fibrillation: Secondary | ICD-10-CM

## 2011-06-04 LAB — POCT INR: INR: 2.1

## 2011-06-04 NOTE — Patient Instructions (Signed)
  Latest dosing instructions   Total Sun Mon Tue Wed Thu Fri Sat   30 5 mg 2.5 mg 5 mg 2.5 mg 5 mg 5 mg 5 mg    (5 mg1) (5 mg0.5) (5 mg1) (5 mg0.5) (5 mg1) (5 mg1) (5 mg1)        

## 2011-06-30 ENCOUNTER — Encounter: Payer: Medicare PPO | Admitting: *Deleted

## 2011-07-26 ENCOUNTER — Ambulatory Visit: Payer: Medicare PPO

## 2011-07-26 DIAGNOSIS — I4891 Unspecified atrial fibrillation: Secondary | ICD-10-CM

## 2011-07-26 LAB — POCT INR: INR: 1.9

## 2011-07-26 NOTE — Patient Instructions (Signed)
  Latest dosing instructions   Total Collin [name] Tue Wed Thu Fri Sat   30 5 mg 2.5 mg 5 mg 2.5 mg 5 mg 5 mg 5 mg    (5 mg1) (5 mg0.5) (5 mg1) (5 mg0.5) (5 mg1) (5 mg1) (5 mg1)

## 2011-08-18 ENCOUNTER — Encounter: Payer: Self-pay | Admitting: Family Medicine

## 2011-08-18 ENCOUNTER — Ambulatory Visit (INDEPENDENT_AMBULATORY_CARE_PROVIDER_SITE_OTHER): Payer: Medicare PPO | Admitting: Family Medicine

## 2011-08-18 VITALS — BP 124/70 | HR 81 | Temp 98.4°F | Wt 285.0 lb

## 2011-08-18 DIAGNOSIS — J329 Chronic sinusitis, unspecified: Secondary | ICD-10-CM

## 2011-08-18 MED ORDER — AMOXICILLIN 500 MG PO CAPS: 1000.0000 mg | ORAL_CAPSULE | Freq: Two times a day (BID) | ORAL | Status: DC

## 2011-08-18 NOTE — Progress Notes (Signed)
Subjective:    Patient ID: Collin [name], male    DOB: 1941-01-14, 70 y.o.   MRN: [id number]  HPI A 70 year old-year-old white male, in with complaints of cough congestion headache sinus drainage sinus pressure and pain has been born on 4 week, and worsening. He reports have been exposure to upper respiratory infection about a week ago through his grandchildren. He denies any fever muscle aches or pain.   Review of Systems    as stated above Objective:   Physical Exam The patient is alert and oriented in no acute distress. HEENT pupils are equal and reactive the ears are clear bilaterally nares moderately rate and maxillary sinus tenderness to palpation. Neck: No lymphadenopathy. Lungs are clear. Heart is regular rate and rhythm. No murmurs rubs or gallops. Skin warm and dry. No cyanosis.       Assessment & Plan:  Assessment acute sinusitis  Plan: Over-the-counter symptomatic treatment for relief to include Delsym and Zyrtec or Claritin. Amoxicillin 500 mg 2 capsules by mouth twice a day x10 days. Call if symptoms worsen or persist. Recheck as needed

## 2011-08-18 NOTE — Patient Instructions (Signed)

## 2011-08-20 ENCOUNTER — Inpatient Hospital Stay (HOSPITAL_COMMUNITY)
Admission: EM | Admit: 2011-08-20 | Discharge: 2011-08-22 | DRG: 683 | Disposition: A | Discharge status: Home / Self Care | Payer: Medicare PPO | Attending: Internal Medicine | Admitting: Internal Medicine

## 2011-08-20 ENCOUNTER — Encounter (HOSPITAL_COMMUNITY): Payer: Self-pay | Admitting: Emergency Medicine

## 2011-08-20 ENCOUNTER — Telehealth: Payer: Self-pay | Admitting: Family Medicine

## 2011-08-20 ENCOUNTER — Other Ambulatory Visit: Payer: Self-pay

## 2011-08-20 ENCOUNTER — Emergency Department (HOSPITAL_COMMUNITY): Payer: Medicare PPO

## 2011-08-20 DIAGNOSIS — E119 Type 2 diabetes mellitus without complications: Secondary | ICD-10-CM | POA: Diagnosis present

## 2011-08-20 DIAGNOSIS — E7800 Pure hypercholesterolemia, unspecified: Secondary | ICD-10-CM

## 2011-08-20 DIAGNOSIS — J069 Acute upper respiratory infection, unspecified: Secondary | ICD-10-CM | POA: Diagnosis present

## 2011-08-20 DIAGNOSIS — I959 Hypotension, unspecified: Secondary | ICD-10-CM | POA: Diagnosis present

## 2011-08-20 DIAGNOSIS — I480 Paroxysmal atrial fibrillation: Secondary | ICD-10-CM | POA: Diagnosis present

## 2011-08-20 DIAGNOSIS — E860 Dehydration: Secondary | ICD-10-CM | POA: Diagnosis present

## 2011-08-20 DIAGNOSIS — E785 Hyperlipidemia, unspecified: Secondary | ICD-10-CM

## 2011-08-20 DIAGNOSIS — I4891 Unspecified atrial fibrillation: Secondary | ICD-10-CM | POA: Diagnosis present

## 2011-08-20 DIAGNOSIS — I4819 Other persistent atrial fibrillation: Secondary | ICD-10-CM | POA: Diagnosis present

## 2011-08-20 DIAGNOSIS — I428 Other cardiomyopathies: Secondary | ICD-10-CM

## 2011-08-20 DIAGNOSIS — N179 Acute kidney failure, unspecified: Principal | ICD-10-CM

## 2011-08-20 LAB — CBC
HCT: 42.8 % (ref 39.0–52.0)
Hemoglobin: 14.7 g/dL (ref 13.0–17.0)
MCH: 31 pg (ref 26.0–34.0)
MCHC: 34.3 g/dL (ref 30.0–36.0)
MCV: 90.3 fL (ref 78.0–100.0)
Platelets: 196 10*3/uL (ref 150–400)
RBC: 4.74 MIL/uL (ref 4.22–5.81)
RDW: 13.2 % (ref 11.5–15.5)
WBC: 5 10*3/uL (ref 4.0–10.5)

## 2011-08-20 LAB — COMPREHENSIVE METABOLIC PANEL
ALT: 24 U/L (ref 0–53)
AST: 26 U/L (ref 0–37)
Albumin: 3.8 g/dL (ref 3.5–5.2)
Alkaline Phosphatase: 82 U/L (ref 39–117)
BUN: 26 mg/dL — ABNORMAL HIGH (ref 6–23)
CO2: 28 mEq/L (ref 19–32)
Calcium: 9.9 mg/dL (ref 8.4–10.5)
Chloride: 96 mEq/L (ref 96–112)
Creatinine, Ser: 1.55 mg/dL — ABNORMAL HIGH (ref 0.50–1.35)
GFR calc Af Amer: 51 mL/min — ABNORMAL LOW (ref >90)
GFR calc non Af Amer: 44 mL/min — ABNORMAL LOW (ref >90)
Glucose, Bld: 99 mg/dL (ref 70–99)
Potassium: 4.1 mEq/L (ref 3.5–5.1)
Sodium: 135 mEq/L (ref 135–145)
Total Bilirubin: 0.3 mg/dL (ref 0.3–1.2)
Total Protein: 7.8 g/dL (ref 6.0–8.3)

## 2011-08-20 LAB — URINALYSIS, ROUTINE W REFLEX MICROSCOPIC
Bilirubin Urine: NEGATIVE
Glucose, UA: NEGATIVE mg/dL
Ketones, ur: NEGATIVE mg/dL
Leukocytes, UA: NEGATIVE
Nitrite: NEGATIVE
Protein, ur: 100 mg/dL — AB
Specific Gravity, Urine: 1.016 (ref 1.005–1.030)
Urobilinogen, UA: 0.2 mg/dL (ref 0.0–1.0)
pH: 5.5 (ref 5.0–8.0)

## 2011-08-20 LAB — DIFFERENTIAL
Basophils Absolute: 0 10*3/uL (ref 0.0–0.1)
Basophils Relative: 0 % (ref 0–1)
Eosinophils Absolute: 0.1 10*3/uL (ref 0.0–0.7)
Eosinophils Relative: 1 % (ref 0–5)
Lymphocytes Relative: 19 % (ref 12–46)
Lymphs Abs: 0.9 10*3/uL (ref 0.7–4.0)
Monocytes Absolute: 0.5 10*3/uL (ref 0.1–1.0)
Monocytes Relative: 10 % (ref 3–12)
Neutro Abs: 3.5 10*3/uL (ref 1.7–7.7)
Neutrophils Relative %: 70 % (ref 43–77)

## 2011-08-20 LAB — URINE MICROSCOPIC-ADD ON

## 2011-08-20 LAB — POCT I-STAT TROPONIN I: Troponin i, poc: 0.01 ng/mL (ref 0.00–0.08)

## 2011-08-20 LAB — PROTIME-INR
INR: 1.63 — ABNORMAL HIGH (ref 0.00–1.49)
Prothrombin Time: 19.6 seconds — ABNORMAL HIGH (ref 11.6–15.2)

## 2011-08-20 LAB — DIGOXIN LEVEL: Digoxin Level: 0.6 ng/mL — ABNORMAL LOW (ref 0.8–2.0)

## 2011-08-20 LAB — APTT: aPTT: 38 seconds — ABNORMAL HIGH (ref 24–37)

## 2011-08-20 MED ORDER — SODIUM CHLORIDE 0.9 % IV BOLUS (SEPSIS)
500.0000 mL | Freq: Once | INTRAVENOUS | Status: AC
  Administered 2011-08-20: 500 mL via INTRAVENOUS

## 2011-08-20 MED ORDER — SODIUM CHLORIDE 0.9 % IV BOLUS (SEPSIS)
1000.0000 mL | Freq: Once | INTRAVENOUS | Status: AC
  Administered 2011-08-20: 1000 mL via INTRAVENOUS

## 2011-08-20 NOTE — Telephone Encounter (Signed)
Spoke to pt's wife- pt is bp dropping to 70/48. Sleeping on and off. He is on bp medication. Unsure when his bp started dropping. No fever. Eating. No chest pains. No chest pain. Pulse 81. Looks a little pale. Nail beds pale but light pink. Per Dr. Artist Pais, have wife call 911 and get them to take him to the ED.

## 2011-08-20 NOTE — ED Notes (Signed)
-   c/o low b/p about 70 palp & elevated fsbs= 167.    Coughing as well since Wednesday but without the funky sputum.  Feeling light-headed while standing up today.   Pt has a defibrilator.   C/o generalized weakness.   Denies any pain at present.  EMS gave 0.9% approx ..   #18g in (L) hand.    12 lead  Shows afib.       B/p= 80 / 50;  Hr= 70;  Rr= 18;  Fsbs=  167.   nkda

## 2011-08-20 NOTE — ED Notes (Signed)
Patient denies pain and is resting comfortably.  

## 2011-08-20 NOTE — ED Notes (Signed)
Vital signs stable. 

## 2011-08-20 NOTE — ED Notes (Signed)
BJY:[id number]<FA> Expected date:08/20/11<BR> Expected time: 3:49 PM<BR> Means of arrival:Ambulance<BR> Comments:<BR> M10 - 70yoM flu s/s hypotensive

## 2011-08-20 NOTE — ED Provider Notes (Signed)
History     CSN: [id number] Arrival date & time: 08/20/2011  4:04 PM   First MD Initiated Contact with Patient 08/20/11 1622      Chief Complaint  Patient presents with  . Hypotension   Level V caveat patient is a poor historian (Consider location/radiation/quality/duration/timing/severity/associated sxs/prior treatment) HPI Patient relates his grandchildren have all been sick. He states about a week ago he starting had a cough with clear mucus production, stuffy nose, without nausea vomiting diarrhea or fever. He states he's been dizzy at times and been sleeping a lot. He states his wife checked his blood pressure today and called EMS. He states he doesn't know why. He states at that time he felt hungry. Per EMS patient was wobbly on arrival and his blood pressure had been obtained by his wife was 80 which is why she called EMS. EMS report his blood pressure 80/50 with a CBG of 167.  The fluid also 150 cc normal saline  Primary care physician Dr. Caryl Never Cardiologist Dr. Tenny Craw  Past Medical History  Diagnosis Date  . Diabetes mellitus   . Heart disease   . Hyperlipidemia   . Cardiomyopathy, nonischemic 1998  . Atrial fibrillation   . OSA (obstructive sleep apnea)     Past Surgical History  Procedure Date  . Cardiac defibrillator placement 2006   patient states it  has never fired  Family History  Problem Relation Age of Onset  . Heart disease Mother   . Sudden death Mother     under age 48  . Lupus Mother   . Hypertension Father   . Stroke Father     History  Substance Use Topics  . Smoking status: Never Smoker   . Smokeless tobacco: Not on file  . Alcohol Use: No   Patient lives with wife   Review of Systems  All other systems reviewed and are negative.    Allergies  Review of patient's allergies indicates no known allergies.  Home Medications   Current Outpatient Rx  Name Route Sig Dispense Refill  . AMOXICILLIN 500 MG PO CAPS Oral Take 2  capsules (1,000 mg total) by mouth 2 (two) times daily. 40 capsule 0  . CARVEDILOL 25 MG PO TABS Oral Take 1 tablet (25 mg total) by mouth 2 (two) times daily with a meal. 180 tablet 3  . DIGOXIN 0.25 MG PO TABS Oral Take 1 tablet (250 mcg total) by mouth daily. 90 tablet 3  . GEMFIBROZIL 600 MG PO TABS Oral Take 1 tablet (600 mg total) by mouth 2 (two) times daily before a meal. 180 tablet 3  . LORATADINE 10 MG PO TABS Oral Take 10 mg by mouth daily.      Marland Kitchen METFORMIN HCL 500 MG PO TABS Oral Take 1 tablet (500 mg total) by mouth 2 (two) times daily with a meal. 180 tablet 3  . NIACIN (ANTIHYPERLIPIDEMIC) 500 MG PO TBCR Oral Take 1 tablet (500 mg total) by mouth at bedtime. 90 tablet 3  . PRAVASTATIN SODIUM 40 MG PO TABS Oral Take 1 tablet (40 mg total) by mouth every evening. 90 tablet 3  . RAMIPRIL 5 MG PO CAPS Oral Take 1 capsule (5 mg total) by mouth daily. 90 capsule 3  . SPIRONOLACTONE 25 MG PO TABS Oral Take 1 tablet (25 mg total) by mouth daily. 90 tablet 3  . WARFARIN SODIUM 5 MG PO TABS Oral Take 5 mg by mouth daily.  BP 91/59  Pulse 75  Temp(Src) 99.1 F (37.3 C) (Oral)  Resp 18  SpO2 97% Hypotension without tachycardia otherwise normal  Physical Exam  Nursing note and vitals reviewed. Constitutional: He is oriented to person, place, and time. He appears well-developed and well-nourished.  Non-toxic appearance. He does not appear ill. No distress.  HENT:  Head: Normocephalic and atraumatic.  Right Ear: External ear normal.  Left Ear: External ear normal.  Nose: Nose normal. No mucosal edema or rhinorrhea.  Mouth/Throat: Oropharynx is clear and moist and mucous membranes are normal. No dental abscesses or uvula swelling.  Eyes: Conjunctivae and EOM are normal. Pupils are equal, round, and reactive to light.  Neck: Normal range of motion and full passive range of motion without pain. Neck supple.  Cardiovascular: Normal rate, regular rhythm and normal heart sounds.  Exam  reveals no gallop and no friction rub.   No murmur heard. Pulmonary/Chest: Effort normal and breath sounds normal. No respiratory distress. He has no wheezes. He has no rhonchi. He has no rales. He exhibits no tenderness and no crepitus.  Abdominal: Soft. Normal appearance and bowel sounds are normal. He exhibits no distension. There is no tenderness. There is no rebound and no guarding.  Musculoskeletal: Normal range of motion. He exhibits no edema and no tenderness.       Moves all extremities well.   Neurological: He is alert and oriented to person, place, and time. He has normal strength. No cranial nerve deficit.  Skin: Skin is warm, dry and intact. No rash noted. No erythema. No pallor.  Psychiatric: He has a normal mood and affect. His speech is normal and behavior is normal. His mood appears not anxious.    ED Course  Procedures (including critical care time)  Patient given 1 L of normal saline. At 1720 when I rechecked him his blood pressure is 117/75. He was also noted to be eating without difficulty. His wife called I was in the room. She states he's been eating normally, she denies any nausea vomiting or diarrhea. She states he's been having sinus infection.  She became hypotensive again and was given another 500 cc bolus of fluid  20:20 Dr Adela Glimpse will admit to step down team 2  Results for orders placed during the hospital encounter of 08/20/11  CBC      Component Value Range   WBC 5.0  4.0 - 10.5 (K/uL)   RBC 4.74  4.22 - 5.81 (MIL/uL)   Hemoglobin 14.7  13.0 - 17.0 (g/dL)   HCT 60.6  30.1 - 60.1 (%)   MCV 90.3  78.0 - 100.0 (fL)   MCH 31.0  26.0 - 34.0 (pg)   MCHC 34.3  30.0 - 36.0 (g/dL)   RDW 09.3  23.5 - 57.3 (%)   Platelets 196  150 - 400 (K/uL)  DIFFERENTIAL      Component Value Range   Neutrophils Relative 70  43 - 77 (%)   Neutro Abs 3.5  1.7 - 7.7 (K/uL)   Lymphocytes Relative 19  12 - 46 (%)   Lymphs Abs 0.9  0.7 - 4.0 (K/uL)   Monocytes Relative 10  3 -  12 (%)   Monocytes Absolute 0.5  0.1 - 1.0 (K/uL)   Eosinophils Relative 1  0 - 5 (%)   Eosinophils Absolute 0.1  0.0 - 0.7 (K/uL)   Basophils Relative 0  0 - 1 (%)   Basophils Absolute 0.0  0.0 - 0.1 (K/uL)  COMPREHENSIVE  METABOLIC PANEL      Component Value Range   Sodium 135  135 - 145 (mEq/L)   Potassium 4.1  3.5 - 5.1 (mEq/L)   Chloride 96  96 - 112 (mEq/L)   CO2 28  19 - 32 (mEq/L)   Glucose, Bld 99  70 - 99 (mg/dL)   BUN 26 (*) 6 - 23 (mg/dL)   Creatinine, Ser 0.86 (*) 0.50 - 1.35 (mg/dL)   Calcium 9.9  8.4 - 57.8 (mg/dL)   Total Protein 7.8  6.0 - 8.3 (g/dL)   Albumin 3.8  3.5 - 5.2 (g/dL)   AST 26  0 - 37 (U/L)   ALT 24  0 - 53 (U/L)   Alkaline Phosphatase 82  39 - 117 (U/L)   Total Bilirubin 0.3  0.3 - 1.2 (mg/dL)   GFR calc non Af Amer 44 (*) >90 (mL/min)   GFR calc Af Amer 51 (*) >90 (mL/min)  APTT      Component Value Range   aPTT 38 (*) 24 - 37 (seconds)  PROTIME-INR      Component Value Range   Prothrombin Time 19.6 (*) 11.6 - 15.2 (seconds)   INR 1.63 (*) 0.00 - 1.49   DIGOXIN LEVEL      Component Value Range   Digoxin Level 0.6 (*) 0.8 - 2.0 (ng/mL)   Laboratory interpretation subtherapeutic Coumadin, subtherapeutic digoxin level, renal insufficiency, otherwise normal  Dg Chest 2 View  08/20/2011  *RADIOLOGY REPORT*  Clinical Data: Cough and weakness.  CHEST - 2 VIEW  Comparison: None.  Findings: AICD is in place.  There is cardiomegaly but no pulmonary edema.  Lungs are clear.  No pneumothorax or effusion.  IMPRESSION: Cardiomegaly without acute disease.  Original Report Authenticated By: Bernadene Bell. Maricela Curet, M.D.      Date: 08/20/2011  Rate: 95  Rhythm: atrial fibrillation  QRS Axis: normal  Intervals: normal  ST/T Wave abnormalities: normal  Conduction Disutrbances:none  Narrative Interpretation: Q waves in inf and ant leads  Old EKG Reviewed: unchanged    Diagnoses that have been ruled out:  Diagnoses that are still under consideration:    Final diagnoses:  Hypotension     Plan admit  CRITICAL CARE Performed by: Devoria Albe L   Total critical care time:30 minutes  Critical care time was exclusive of separately billable procedures and treating other patients.  Critical care was necessary to treat or prevent imminent or life-threatening deterioration.  Critical care was time spent personally by me on the following activities: development of treatment plan with patient and/or surrogate as well as nursing, discussions with consultants, evaluation of patient's response to treatment, examination of patient, obtaining history from patient or surrogate, ordering and performing treatments and interventions, ordering and review of laboratory studies, ordering and review of radiographic studies, pulse oximetry and re-evaluation of patient's condition.  MDM          Ward Givens, MD 08/20/11 2026

## 2011-08-20 NOTE — ED Notes (Signed)
Patient transported to X-ray 

## 2011-08-20 NOTE — Telephone Encounter (Signed)
Pt was in earlier this week for a sinus infection and was given amoxicillin    Pt is still coughing sleeping a lot and is having very low blood pressure 70/48 Pt requesting to be contacted

## 2011-08-20 NOTE — ED Notes (Signed)
Patient is resting comfortably. 

## 2011-08-20 NOTE — ED Notes (Signed)
MD at bedside. 

## 2011-08-20 NOTE — H&P (Signed)
PCP:   [name], MD   Chief Complaint:  hypotensive  HPI: Collin [name] is a 70 y.o. male   has a past medical history of Diabetes mellitus; Heart disease; Hyperlipidemia; Cardiomyopathy, nonischemic (1998); Atrial fibrillation; and OSA (obstructive sleep apnea).   Presented with  Episode of hypotension down to 70's while at home. Felt slightly lightheaded, no nausea, decreased PO intake. Mild cough for past 4 days. Wife is sick with possible flu. He never had any fever. Now he is feeling much better. Had similar episode last year that thought to be secondary to viral illness.  Review of Systems:    Pertinent Positives:cough,   Pertinent Negatives: No fever. No chest pain. No SOB, no nausea vomiting or diarrhea no neurological complaints no syncope no falls Otherwise ROS are negative except for above, 10 systems were reviewed  Past Medical History: Past Medical History  Diagnosis Date  . Diabetes mellitus   . Heart disease   . Hyperlipidemia   . Cardiomyopathy, nonischemic 1998  . Atrial fibrillation   . OSA (obstructive sleep apnea)    Past Surgical History  Procedure Date  . Cardiac defibrillator placement 2006     Medications: Prior to Admission medications   Medication Sig Start Date End Date Taking? Authorizing Provider  amoxicillin (AMOXIL) 500 MG capsule Take 2 capsules (1,000 mg total) by mouth 2 (two) times daily. 08/18/11 09/08/11 Yes Janell Quiet, NP  carvedilol (COREG) 25 MG tablet Take 1 tablet (25 mg total) by mouth 2 (two) times daily with a meal. 01/28/11  Yes Bruce W Burchette  digoxin (LANOXIN) 0.25 MG tablet Take 1 tablet (250 mcg total) by mouth daily. 01/28/11  Yes Bruce W Burchette  gemfibrozil (LOPID) 600 MG tablet Take 1 tablet (600 mg total) by mouth 2 (two) times daily before a meal. 03/16/11  Yes Bruce W Burchette  loratadine (CLARITIN) 10 MG tablet Take 10 mg by mouth daily.     Yes Historical Provider, MD  metFORMIN (GLUCOPHAGE)  500 MG tablet Take 1 tablet (500 mg total) by mouth 2 (two) times daily with a meal. 03/16/11  Yes Bruce W Burchette  niacin (NIASPAN) 500 MG CR tablet Take 1 tablet (500 mg total) by mouth at bedtime. 01/04/11 01/04/12 Yes Pricilla Riffle, MD  pravastatin (PRAVACHOL) 40 MG tablet Take 1 tablet (40 mg total) by mouth every evening. 01/04/11 01/04/12 Yes Pricilla Riffle, MD  ramipril (ALTACE) 5 MG capsule Take 1 capsule (5 mg total) by mouth daily. 03/16/11  Yes Bruce W Burchette  spironolactone (ALDACTONE) 25 MG tablet Take 1 tablet (25 mg total) by mouth daily. 06/03/11  Yes Bruce W Burchette  warfarin (COUMADIN) 5 MG tablet Take 5 mg by mouth daily.     Yes Historical Provider, MD    Allergies:   Allergies  Allergen Reactions  . Niaspan (Niacin (Antihyperlipidemic)) Other (See Comments)    flushes    Social History:  reports that he has never smoked. He does not have any smokeless tobacco history on file. He reports that he does not drink alcohol.   Family History: family history includes Heart disease in his mother; Hypertension in his father; Lupus in his mother; Stroke in his father; and Sudden death in his mother.    Physical Exam: Patient Vitals for the past 24 hrs:  BP Temp Temp src Pulse Resp SpO2  08/20/11 2132 97/61 mmHg - - 94  - -  08/20/11 2132 97/57 mmHg - - 93  - -  08/20/11 2132 103/62 mmHg - - 87  - -  08/20/11 2001 95/52 mmHg 98.5 F (36.9 C) Oral 95  18  97 %  08/20/11 1853 117/75 mmHg 98.8 F (37.1 C) Oral 99  18  96 %  08/20/11 1654 91/59 mmHg 99.1 F (37.3 C) Oral 75  18  97 %     Alert and Oriented in No Acute distress Mucous Membranes and Skin: dry, diminished skin turger Head Non traumatic, neck supple Heart: Irregular irregular no murmurs appreciated Lungs: Somewhat distant but no crackles appreciated, has very mild wheezes Abdomen: Obese but nontender nondistended Lower extremities: She is out clubbing cyanosis or edema Neurologically Grossly intact  strength Skin clean Dry and intact no rash body mass index is unknown because there is no height or weight on file. 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  Labs on Admission:   [facility] 08/20/11 1651  NA 135  K 4.1  CL 96  CO2 28  GLUCOSE 99  BUN 26*  CREATININE 1.55*  CALCIUM 9.9  MG --  PHOS --    Basename 08/20/11 1651  AST 26  ALT 24  ALKPHOS 82  BILITOT 0.3  PROT 7.8  ALBUMIN 3.8   No results found for this basename: LIPASE:2,AMYLASE:2 in the last 72 hours  Basename 08/20/11 1651  WBC 5.0  NEUTROABS 3.5  HGB 14.7  HCT 42.8  MCV 90.3  PLT 196   No results found for this basename: CKTOTAL:3,CKMB:3,CKMBINDEX:3,TROPONINI:3 in the last 72 hours No results found for this basename: TSH,T4TOTAL,FREET3,T3FREE,THYROIDAB in the last 72 hours No results found for this basename: VITAMINB12:2,FOLATE:2,FERRITIN:2,TIBC:2,IRON:2,RETICCTPCT:2 in the last 72 hours Lab Results  Component Value Date   HGBA1C 6.6* 04/15/2011    The CrCl is unknown because both a height and weight (above a minimum accepted value) are required for this calculation. ABG No results found for this basename: phart, pco2, po2, hco3, tco2, acidbasedef, o2sat     No results found for this basename: DDIMER     Other results: Reviewed personally by me ED ECG REPORT  Rate: 90  Rhythm: Atrial fibrilation ST&T Change: No ischemic changes  UA no evidence of UTI Digoxin Level 0.6 ng/mL L    Blood Culture No results found for this basename: sdes, specrequest, cult, reptstatus    Echogram done in May 2005 showed EF of 50% with slight dilatation and evidence of moderate LVH   Radiological Exams on Admission: Dg Chest 2 View  08/20/2011  *RADIOLOGY REPORT*  Clinical Data: Cough and weakness.  CHEST - 2 VIEW  Comparison: None.  Findings: AICD is in place.  There is cardiomegaly but no pulmonary edema.  Lungs are clear.  No pneumothorax or effusion.  IMPRESSION:  Cardiomegaly without acute disease.  Original Report Authenticated By: Bernadene Bell. Maricela Curet, M.D.    Assessment/Plan  Present on Admission:  .Hypotension - likely multifactorial suspect he slightly dehydrated. He does not appear to be toxic nonfebrile. No evidence of infection. Will hold off on his home blood pressure medications. Will watch carefully and step down to evaluate for possible cardiac etiology. We'll cycle cardiac enzymes. Obtain serial EKG  .Atrial fibrillation - rate controlled at this point as soon as blood pressure allows would need to restart his beta blocker for now continued digoxin.  Digoxin level is non-toxic. Continue warfarin.   .Nonischemic cardiomyopathy - his last echogram was in May he had not had any worsening symptoms recently.  .Type 2  diabetes mellitus - will put on sliding scale   .Acute renal failure - likely secondary to mild dehydration will give gentle IV fluids hold ACE inhibitors for recheck creatinine in a.m. if no significant improvement consider father workup   Prophylaxis: On Coumadin  CODE STATUS: Full code as discussed with him   Selma Mink 08/20/2011, 9:06 PM

## 2011-08-21 ENCOUNTER — Other Ambulatory Visit: Payer: Self-pay

## 2011-08-21 LAB — TSH: TSH: 1.113 u[IU]/mL (ref 0.350–4.500)

## 2011-08-21 LAB — PHOSPHORUS: Phosphorus: 3.8 mg/dL (ref 2.3–4.6)

## 2011-08-21 LAB — COMPREHENSIVE METABOLIC PANEL
ALT: 23 U/L (ref 0–53)
AST: 24 U/L (ref 0–37)
Albumin: 3.6 g/dL (ref 3.5–5.2)
Alkaline Phosphatase: 81 U/L (ref 39–117)
BUN: 22 mg/dL (ref 6–23)
CO2: 26 mEq/L (ref 19–32)
Calcium: 10.1 mg/dL (ref 8.4–10.5)
Chloride: 99 mEq/L (ref 96–112)
Creatinine, Ser: 1.02 mg/dL (ref 0.50–1.35)
GFR calc Af Amer: 84 mL/min — ABNORMAL LOW (ref >90)
GFR calc non Af Amer: 72 mL/min — ABNORMAL LOW (ref >90)
Glucose, Bld: 113 mg/dL — ABNORMAL HIGH (ref 70–99)
Potassium: 3.9 mEq/L (ref 3.5–5.1)
Sodium: 135 mEq/L (ref 135–145)
Total Bilirubin: 0.2 mg/dL — ABNORMAL LOW (ref 0.3–1.2)
Total Protein: 7.5 g/dL (ref 6.0–8.3)

## 2011-08-21 LAB — CARDIAC PANEL(CRET KIN+CKTOT+MB+TROPI)
CK, MB: 1.9 ng/mL (ref 0.3–4.0)
CK, MB: 2.4 ng/mL (ref 0.3–4.0)
CK, MB: 2.6 ng/mL (ref 0.3–4.0)
Relative Index: INVALID (ref 0.0–2.5)
Relative Index: 2.3 (ref 0.0–2.5)
Relative Index: 2.2 (ref 0.0–2.5)
Total CK: 89 U/L (ref 7–232)
Total CK: 104 U/L (ref 7–232)
Total CK: 117 U/L (ref 7–232)
Troponin I: <0.3 ng/mL (ref <0.30)
Troponin I: <0.3 ng/mL (ref <0.30)
Troponin I: <0.3 ng/mL (ref <0.30)

## 2011-08-21 LAB — CBC
HCT: 41.2 % (ref 39.0–52.0)
Hemoglobin: 14 g/dL (ref 13.0–17.0)
MCH: 30.6 pg (ref 26.0–34.0)
MCHC: 34 g/dL (ref 30.0–36.0)
MCV: 90.2 fL (ref 78.0–100.0)
Platelets: 182 10*3/uL (ref 150–400)
RBC: 4.57 MIL/uL (ref 4.22–5.81)
RDW: 13.4 % (ref 11.5–15.5)
WBC: 3.2 10*3/uL — ABNORMAL LOW (ref 4.0–10.5)

## 2011-08-21 LAB — PROTIME-INR
INR: 1.73 — ABNORMAL HIGH (ref 0.00–1.49)
Prothrombin Time: 20.6 seconds — ABNORMAL HIGH (ref 11.6–15.2)

## 2011-08-21 LAB — GLUCOSE, CAPILLARY
Glucose-Capillary: 115 mg/dL — ABNORMAL HIGH (ref 70–99)
Glucose-Capillary: 113 mg/dL — ABNORMAL HIGH (ref 70–99)
Glucose-Capillary: 128 mg/dL — ABNORMAL HIGH (ref 70–99)
Glucose-Capillary: 124 mg/dL — ABNORMAL HIGH (ref 70–99)
Glucose-Capillary: 169 mg/dL — ABNORMAL HIGH (ref 70–99)

## 2011-08-21 LAB — MAGNESIUM: Magnesium: 1.7 mg/dL (ref 1.5–2.5)

## 2011-08-21 MED ORDER — LORATADINE 10 MG PO TABS
10.0000 mg | ORAL_TABLET | Freq: Every day | ORAL | Status: DC
  Administered 2011-08-21 – 2011-08-22 (×2): 10 mg via ORAL
  Filled 2011-08-21 (×3): qty 1

## 2011-08-21 MED ORDER — AMOXICILLIN-POT CLAVULANATE 875-125 MG PO TABS
1.0000 | ORAL_TABLET | Freq: Two times a day (BID) | ORAL | Status: DC
  Administered 2011-08-21 – 2011-08-22 (×3): 1 via ORAL
  Filled 2011-08-21 (×7): qty 1

## 2011-08-21 MED ORDER — WARFARIN SODIUM 7.5 MG PO TABS
7.5000 mg | ORAL_TABLET | Freq: Once | ORAL | Status: AC
  Administered 2011-08-21: 7.5 mg via ORAL
  Filled 2011-08-21: qty 1

## 2011-08-21 MED ORDER — ALBUTEROL SULFATE (5 MG/ML) 0.5% IN NEBU: 2.5000 mg | INHALATION_SOLUTION | RESPIRATORY_TRACT | Status: DC | PRN

## 2011-08-21 MED ORDER — CARVEDILOL 12.5 MG PO TABS
12.5000 mg | ORAL_TABLET | Freq: Two times a day (BID) | ORAL | Status: DC
  Administered 2011-08-21 – 2011-08-22 (×2): 12.5 mg via ORAL
  Filled 2011-08-21 (×5): qty 1

## 2011-08-21 MED ORDER — ACETAMINOPHEN 650 MG RE SUPP: 650.0000 mg | Freq: Four times a day (QID) | RECTAL | Status: DC | PRN

## 2011-08-21 MED ORDER — ACETAMINOPHEN 325 MG PO TABS: 650.0000 mg | ORAL_TABLET | Freq: Four times a day (QID) | ORAL | Status: DC | PRN

## 2011-08-21 MED ORDER — INSULIN ASPART 100 UNIT/ML ~~LOC~~ SOLN: 0.0000 [IU] | Freq: Every day | SUBCUTANEOUS | Status: DC

## 2011-08-21 MED ORDER — ASPIRIN EC 81 MG PO TBEC
81.0000 mg | DELAYED_RELEASE_TABLET | Freq: Every day | ORAL | Status: DC
  Administered 2011-08-21 – 2011-08-22 (×2): 81 mg via ORAL
  Filled 2011-08-21 (×3): qty 1

## 2011-08-21 MED ORDER — ALUM & MAG HYDROXIDE-SIMETH 200-200-20 MG/5ML PO SUSP: 30.0000 mL | Freq: Four times a day (QID) | ORAL | Status: DC | PRN

## 2011-08-21 MED ORDER — SODIUM CHLORIDE 0.9 % IV SOLN: INTRAVENOUS | Status: AC

## 2011-08-21 MED ORDER — GUAIFENESIN-DM 100-10 MG/5ML PO SYRP: 5.0000 mL | ORAL_SOLUTION | ORAL | Status: DC | PRN

## 2011-08-21 MED ORDER — SIMVASTATIN 20 MG PO TABS
20.0000 mg | ORAL_TABLET | Freq: Every day | ORAL | Status: DC
  Administered 2011-08-21: 20 mg via ORAL
  Filled 2011-08-21 (×3): qty 1

## 2011-08-21 MED ORDER — GUAIFENESIN ER 600 MG PO TB12
600.0000 mg | ORAL_TABLET | Freq: Two times a day (BID) | ORAL | Status: DC
  Administered 2011-08-21 – 2011-08-22 (×3): 600 mg via ORAL
  Filled 2011-08-21 (×6): qty 1

## 2011-08-21 MED ORDER — GEMFIBROZIL 600 MG PO TABS
600.0000 mg | ORAL_TABLET | Freq: Two times a day (BID) | ORAL | Status: DC
  Administered 2011-08-21 – 2011-08-22 (×3): 600 mg via ORAL
  Filled 2011-08-21 (×6): qty 1

## 2011-08-21 MED ORDER — INSULIN ASPART 100 UNIT/ML ~~LOC~~ SOLN
0.0000 [IU] | Freq: Three times a day (TID) | SUBCUTANEOUS | Status: DC
  Administered 2011-08-21: 3 [IU] via SUBCUTANEOUS
  Filled 2011-08-21: qty 3

## 2011-08-21 MED ORDER — ONDANSETRON HCL 4 MG/2ML IJ SOLN: 4.0000 mg | Freq: Four times a day (QID) | INTRAMUSCULAR | Status: DC | PRN

## 2011-08-21 MED ORDER — ONDANSETRON HCL 4 MG PO TABS: 4.0000 mg | ORAL_TABLET | Freq: Four times a day (QID) | ORAL | Status: DC | PRN

## 2011-08-21 MED ORDER — DIGOXIN 250 MCG PO TABS
250.0000 ug | ORAL_TABLET | Freq: Every day | ORAL | Status: DC
  Administered 2011-08-21 – 2011-08-22 (×2): 250 ug via ORAL
  Filled 2011-08-21 (×3): qty 1

## 2011-08-21 MED ORDER — NIACIN ER (ANTIHYPERLIPIDEMIC) 500 MG PO TBCR
500.0000 mg | EXTENDED_RELEASE_TABLET | Freq: Every day | ORAL | Status: DC
  Administered 2011-08-21: 500 mg via ORAL
  Filled 2011-08-21 (×3): qty 1

## 2011-08-21 MED ORDER — WARFARIN SODIUM 5 MG PO TABS
5.0000 mg | ORAL_TABLET | Freq: Every day | ORAL | Status: DC
  Filled 2011-08-21: qty 1

## 2011-08-21 NOTE — Progress Notes (Deleted)
Paged PT/OT to verify if they will be evaluating the patient today.

## 2011-08-21 NOTE — ED Notes (Signed)
Report given to TCU.  

## 2011-08-21 NOTE — Progress Notes (Signed)
ANTICOAGULATION CONSULT NOTE - Initial Consult  Pharmacy Consult for Warfarin Indication: atrial fibrillation  Allergies  Allergen Reactions  . Niaspan (Niacin (Antihyperlipidemic)) Other (See Comments)    flushes    Patient Measurements:   Adjusted Body Weight:   Vital Signs: Temp: 98.3 F (36.8 C) (12/14 2339) Temp src: Oral (12/14 2339) BP: 107/61 mmHg (12/14 2339) Pulse Rate: 82  (12/14 2339)  Labs:  Basename 08/20/11 1651  HGB 14.7  HCT 42.8  PLT 196  APTT 38*  LABPROT 19.6*  INR 1.63*  HEPARINUNFRC --  CREATININE 1.55*  CKTOTAL --  CKMB --  TROPONINI --   The CrCl is unknown because both a height and weight (above a minimum accepted value) are required for this calculation.  Medical History: Past Medical History  Diagnosis Date  . Diabetes mellitus   . Heart disease   . Hyperlipidemia   . Cardiomyopathy, nonischemic 1998  . Atrial fibrillation   . OSA (obstructive sleep apnea)       Assessment: Patient on chronic warfarin for atrial fibrillation.  INR below goal. Goal of Therapy:  INR 2-3   Plan:  Continue home dose 5mg  po warfarin daily, Daily PT/INR  Darlina Guys, Lelah Rennaker Crowford 08/21/2011,1:47 AM

## 2011-08-21 NOTE — Progress Notes (Addendum)
ANTICOAGULATION CONSULT NOTE - Follow Up  Pharmacy Consult for Warfarin Indication: atrial fibrillation  Allergies  Allergen Reactions  . Niaspan (Niacin (Antihyperlipidemic)) Other (See Comments)    flushes    Patient Measurements:   Adjusted Body Weight:   Vital Signs: Temp: 98.2 F (36.8 C) (12/15 0413) Temp src: Oral (12/15 0413) BP: 110/60 mmHg (12/15 0415) Pulse Rate: 106  (12/15 0430)  Labs:  Basename 08/21/11 0540 08/21/11 0200 08/20/11 1651  HGB 14.0 -- 14.7  HCT 41.2 -- 42.8  PLT 182 -- 196  APTT -- -- 38*  LABPROT -- -- 19.6*  INR -- -- 1.63*  HEPARINUNFRC -- -- --  CREATININE 1.02 -- 1.55*  CKTOTAL 104 89 --  CKMB 2.4 1.9 --  TROPONINI <0.30 <0.30 --   The CrCl is unknown because both a height and weight (above a minimum accepted value) are required for this calculation.  Medical History: Past Medical History  Diagnosis Date  . Diabetes mellitus   . Heart disease   . Hyperlipidemia   . Cardiomyopathy, nonischemic 1998  . Atrial fibrillation   . OSA (obstructive sleep apnea)    Assessment:  70 YOM on chronic warfarin PTA for h/o atrial fibrillation  Home dose = 5 mg po daily  Admitted with subtherapeutic INR (1.63) and coumadin 5 mg po daily was resumed - first dose to start today.  Last dose of coumadin 12/13 so pt missed 12/14 dose.  INR 1.73 this AM  No bleeding/complications reported   CBC stable  Goal of Therapy:  INR 2-3   Plan:   D/C scheduled coumadin regimen  Coumadin 7.5 mg po x 1 today per protocol, will reassess INR tomorrow and continue coumadin dosing.   Geoffry Paradise Thi 08/21/2011,8:25 AM

## 2011-08-21 NOTE — Progress Notes (Signed)
Subjective: Pt is comfortable. Denies any new complaints. Has cough.   Objective: Weight change:   Intake/Output Summary (Last 24 hours) at 08/21/11 1203 Last data filed at 08/21/11 0908  Gross per 24 hour  Intake   1480 ml  Output      0 ml  Net   1480 ml   On exam  Pt is alert afebrile and comfortable CVS S1 and S2 heard Lungs clear Abdomen: obese, small umbilical hernia, non tender bowel sounds heard Extremities: trace edema  Lab Results: Results for orders placed during the hospital encounter of 08/20/11 (from the past 24 hour(s))  CBC     Status: Normal   Collection Time   08/20/11  4:51 PM      Component Value Range   WBC 5.0  4.0 - 10.5 (K/uL)   RBC 4.74  4.22 - 5.81 (MIL/uL)   Hemoglobin 14.7  13.0 - 17.0 (g/dL)   HCT 40.9  81.1 - 91.4 (%)   MCV 90.3  78.0 - 100.0 (fL)   MCH 31.0  26.0 - 34.0 (pg)   MCHC 34.3  30.0 - 36.0 (g/dL)   RDW 78.2  95.6 - 21.3 (%)   Platelets 196  150 - 400 (K/uL)  DIFFERENTIAL     Status: Normal   Collection Time   08/20/11  4:51 PM      Component Value Range   Neutrophils Relative 70  43 - 77 (%)   Neutro Abs 3.5  1.7 - 7.7 (K/uL)   Lymphocytes Relative 19  12 - 46 (%)   Lymphs Abs 0.9  0.7 - 4.0 (K/uL)   Monocytes Relative 10  3 - 12 (%)   Monocytes Absolute 0.5  0.1 - 1.0 (K/uL)   Eosinophils Relative 1  0 - 5 (%)   Eosinophils Absolute 0.1  0.0 - 0.7 (K/uL)   Basophils Relative 0  0 - 1 (%)   Basophils Absolute 0.0  0.0 - 0.1 (K/uL)  COMPREHENSIVE METABOLIC PANEL     Status: Abnormal   Collection Time   08/20/11  4:51 PM      Component Value Range   Sodium 135  135 - 145 (mEq/L)   Potassium 4.1  3.5 - 5.1 (mEq/L)   Chloride 96  96 - 112 (mEq/L)   CO2 28  19 - 32 (mEq/L)   Glucose, Bld 99  70 - 99 (mg/dL)   BUN 26 (*) 6 - 23 (mg/dL)   Creatinine, Ser 0.86 (*) 0.50 - 1.35 (mg/dL)   Calcium 9.9  8.4 - 57.8 (mg/dL)   Total Protein 7.8  6.0 - 8.3 (g/dL)   Albumin 3.8  3.5 - 5.2 (g/dL)   AST 26  0 - 37 (U/L)   ALT 24  0  - 53 (U/L)   Alkaline Phosphatase 82  39 - 117 (U/L)   Total Bilirubin 0.3  0.3 - 1.2 (mg/dL)   GFR calc non Af Amer 44 (*) >90 (mL/min)   GFR calc Af Amer 51 (*) >90 (mL/min)  APTT     Status: Abnormal   Collection Time   08/20/11  4:51 PM      Component Value Range   aPTT 38 (*) 24 - 37 (seconds)  PROTIME-INR     Status: Abnormal   Collection Time   08/20/11  4:51 PM      Component Value Range   Prothrombin Time 19.6 (*) 11.6 - 15.2 (seconds)   INR 1.63 (*) 0.00 - 1.49  DIGOXIN LEVEL     Status: Abnormal   Collection Time   08/20/11  4:51 PM      Component Value Range   Digoxin Level 0.6 (*) 0.8 - 2.0 (ng/mL)  URINALYSIS, ROUTINE W REFLEX MICROSCOPIC     Status: Abnormal   Collection Time   08/20/11  5:11 PM      Component Value Range   Color, Urine YELLOW  YELLOW    APPearance CLOUDY (*) CLEAR    Specific Gravity, Urine 1.016  1.005 - 1.030    pH 5.5  5.0 - 8.0    Glucose, UA NEGATIVE  NEGATIVE (mg/dL)   Hgb urine dipstick SMALL (*) NEGATIVE    Bilirubin Urine NEGATIVE  NEGATIVE    Ketones, ur NEGATIVE  NEGATIVE (mg/dL)   Protein, ur 914 (*) NEGATIVE (mg/dL)   Urobilinogen, UA 0.2  0.0 - 1.0 (mg/dL)   Nitrite NEGATIVE  NEGATIVE    Leukocytes, UA NEGATIVE  NEGATIVE   URINE MICROSCOPIC-ADD ON     Status: Abnormal   Collection Time   08/20/11  5:11 PM      Component Value Range   Squamous Epithelial / LPF FEW (*) RARE    WBC, UA 0-2  <3 (WBC/hpf)   RBC / HPF 3-6  <3 (RBC/hpf)   Bacteria, UA RARE  RARE    Casts HYALINE CASTS (*) NEGATIVE   POCT I-STAT TROPONIN I     Status: Normal   Collection Time   08/20/11  8:59 PM      Component Value Range   Troponin i, poc 0.01  0.00 - 0.08 (ng/mL)   Comment 3           CARDIAC PANEL(CRET KIN+CKTOT+MB+TROPI)     Status: Normal   Collection Time   08/21/11  2:00 AM      Component Value Range   Total CK 89  7 - 232 (U/L)   CK, MB 1.9  0.3 - 4.0 (ng/mL)   Troponin I <0.30  <0.30 (ng/mL)   Relative Index RELATIVE INDEX IS  INVALID  0.0 - 2.5   MAGNESIUM     Status: Normal   Collection Time   08/21/11  5:40 AM      Component Value Range   Magnesium 1.7  1.5 - 2.5 (mg/dL)  PHOSPHORUS     Status: Normal   Collection Time   08/21/11  5:40 AM      Component Value Range   Phosphorus 3.8  2.3 - 4.6 (mg/dL)  COMPREHENSIVE METABOLIC PANEL     Status: Abnormal   Collection Time   08/21/11  5:40 AM      Component Value Range   Sodium 135  135 - 145 (mEq/L)   Potassium 3.9  3.5 - 5.1 (mEq/L)   Chloride 99  96 - 112 (mEq/L)   CO2 26  19 - 32 (mEq/L)   Glucose, Bld 113 (*) 70 - 99 (mg/dL)   BUN 22  6 - 23 (mg/dL)   Creatinine, Ser 7.82  0.50 - 1.35 (mg/dL)   Calcium 95.6  8.4 - 10.5 (mg/dL)   Total Protein 7.5  6.0 - 8.3 (g/dL)   Albumin 3.6  3.5 - 5.2 (g/dL)   AST 24  0 - 37 (U/L)   ALT 23  0 - 53 (U/L)   Alkaline Phosphatase 81  39 - 117 (U/L)   Total Bilirubin 0.2 (*) 0.3 - 1.2 (mg/dL)   GFR calc non Af Amer 72 (*) >90 (  mL/min)   GFR calc Af Amer 84 (*) >90 (mL/min)  CBC     Status: Abnormal   Collection Time   08/21/11  5:40 AM      Component Value Range   WBC 3.2 (*) 4.0 - 10.5 (K/uL)   RBC 4.57  4.22 - 5.81 (MIL/uL)   Hemoglobin 14.0  13.0 - 17.0 (g/dL)   HCT 16.1  09.6 - 04.5 (%)   MCV 90.2  78.0 - 100.0 (fL)   MCH 30.6  26.0 - 34.0 (pg)   MCHC 34.0  30.0 - 36.0 (g/dL)   RDW 40.9  81.1 - 91.4 (%)   Platelets 182  150 - 400 (K/uL)  CARDIAC PANEL(CRET KIN+CKTOT+MB+TROPI)     Status: Normal   Collection Time   08/21/11  5:40 AM      Component Value Range   Total CK 104  7 - 232 (U/L)   CK, MB 2.4  0.3 - 4.0 (ng/mL)   Troponin I <0.30  <0.30 (ng/mL)   Relative Index 2.3  0.0 - 2.5   PROTIME-INR     Status: Abnormal   Collection Time   08/21/11  5:40 AM      Component Value Range   Prothrombin Time 20.6 (*) 11.6 - 15.2 (seconds)   INR 1.73 (*) 0.00 - 1.49   GLUCOSE, CAPILLARY     Status: Abnormal   Collection Time   08/21/11  6:44 AM      Component Value Range   Glucose-Capillary 115  (*) 70 - 99 (mg/dL)  GLUCOSE, CAPILLARY     Status: Abnormal   Collection Time   08/21/11  7:55 AM      Component Value Range   Glucose-Capillary 113 (*) 70 - 99 (mg/dL)  CARDIAC PANEL(CRET KIN+CKTOT+MB+TROPI)     Status: Normal   Collection Time   08/21/11 11:30 AM      Component Value Range   Total CK 117  7 - 232 (U/L)   CK, MB 2.6  0.3 - 4.0 (ng/mL)   Troponin I <0.30  <0.30 (ng/mL)   Relative Index 2.2  0.0 - 2.5      Micro Results: No results found for this or any previous visit (from the past 240 hour(s)).  Studies/Results: Dg Chest 2 View  08/20/2011  *RADIOLOGY REPORT*  Clinical Data: Cough and weakness.  CHEST - 2 VIEW  Comparison: None.  Findings: AICD is in place.  There is cardiomegaly but no pulmonary edema.  Lungs are clear.  No pneumothorax or effusion.  IMPRESSION: Cardiomegaly without acute disease.  Original Report Authenticated By: Bernadene Bell. Maricela Curet, M.D.   Medications: Scheduled Meds:   . aspirin EC  81 mg Oral Daily  . digoxin  250 mcg Oral Daily  . gemfibrozil  600 mg Oral BID AC  . guaiFENesin  600 mg Oral BID  . insulin aspart  0-15 Units Subcutaneous TID WC  . insulin aspart  0-5 Units Subcutaneous QHS  . loratadine  10 mg Oral Daily  . niacin  500 mg Oral QHS  . pneumococcal 23 valent vaccine  0.5 mL Intramuscular Once  . simvastatin  20 mg Oral q1800  . sodium chloride  1,000 mL Intravenous Once  . sodium chloride  500 mL Intravenous Once  . warfarin  7.5 mg Oral ONCE-1800  . DISCONTD: warfarin  5 mg Oral q1800   Continuous Infusions:   . sodium chloride Stopped (08/21/11 0546)   PRN Meds:.acetaminophen, acetaminophen, albuterol, alum & mag  hydroxide-simeth, guaiFENesin-dextromethorphan, ondansetron (ZOFRAN) IV, ondansetron  Assessment/Plan: Patient Active Hospital Problem List: Hypotension (08/20/2011)   Probably secondary to dehydration in the setting of URI . Pt is appropriately hydrated and his BP improved.  His cardiac enzymes  are negative. Will repeat EKG today. Pt denies any chest pain, sob or palpitations.  Atrial fibrillation (12/14/2010) Rate controlled. Will resume his home meds.  Nonischemic cardiomyopathy (12/14/2010) Continue with his home meds.  Type 2 diabetes mellitus (12/14/2010) Metformin held for worsened creatinine.  Acute renal failure (08/20/2011) Probably  Prerenal. Will check his creatinine later today.  URI: Start the pt on augmentin.   LOS: 1 day   Myrtha Tonkovich 08/21/2011, 12:03 PM

## 2011-08-21 NOTE — Progress Notes (Deleted)
At patients request, paged PT/OT for patient evaluation.  Was told by the answering service at [phone], that PT would be up today, they are aware the patient needs to be evaluated and they will call if they weren't able to make it today.

## 2011-08-21 NOTE — ED Notes (Signed)
Pt admitted to TCU.  In no acute distress. Alert and oriented.  Resting quietly in bed watching television. No complaints at this time.

## 2011-08-21 NOTE — ED Notes (Signed)
Pt placed in hospital bed anticipating further holding for telemetry bed.

## 2011-08-21 NOTE — Progress Notes (Signed)
Patient glucose = 169.  Patient refused NovoLog coverage.

## 2011-08-22 LAB — BASIC METABOLIC PANEL
BUN: 19 mg/dL (ref 6–23)
CO2: 26 mEq/L (ref 19–32)
Calcium: 10.1 mg/dL (ref 8.4–10.5)
Chloride: 100 mEq/L (ref 96–112)
Creatinine, Ser: 0.86 mg/dL (ref 0.50–1.35)
GFR calc Af Amer: >90 mL/min (ref >90)
GFR calc non Af Amer: 86 mL/min — ABNORMAL LOW (ref >90)
Glucose, Bld: 154 mg/dL — ABNORMAL HIGH (ref 70–99)
Potassium: 4.2 mEq/L (ref 3.5–5.1)
Sodium: 137 mEq/L (ref 135–145)

## 2011-08-22 LAB — GLUCOSE, CAPILLARY
Glucose-Capillary: 99 mg/dL (ref 70–99)
Glucose-Capillary: 115 mg/dL — ABNORMAL HIGH (ref 70–99)
Glucose-Capillary: 142 mg/dL — ABNORMAL HIGH (ref 70–99)

## 2011-08-22 LAB — CBC
HCT: 45.6 % (ref 39.0–52.0)
Hemoglobin: 15.2 g/dL (ref 13.0–17.0)
MCH: 30.2 pg (ref 26.0–34.0)
MCHC: 33.3 g/dL (ref 30.0–36.0)
MCV: 90.5 fL (ref 78.0–100.0)
Platelets: 203 10*3/uL (ref 150–400)
RBC: 5.04 MIL/uL (ref 4.22–5.81)
RDW: 13.3 % (ref 11.5–15.5)
WBC: 3.6 10*3/uL — ABNORMAL LOW (ref 4.0–10.5)

## 2011-08-22 LAB — PROTIME-INR
INR: 1.38 (ref 0.00–1.49)
Prothrombin Time: 17.2 seconds — ABNORMAL HIGH (ref 11.6–15.2)

## 2011-08-22 MED ORDER — GUAIFENESIN ER 600 MG PO TB12: 600.0000 mg | ORAL_TABLET | Freq: Two times a day (BID) | ORAL | Status: DC

## 2011-08-22 MED ORDER — WARFARIN SODIUM 10 MG PO TABS
10.0000 mg | ORAL_TABLET | Freq: Once | ORAL | Status: DC
  Filled 2011-08-22: qty 1

## 2011-08-22 MED ORDER — AMOXICILLIN-POT CLAVULANATE 875-125 MG PO TABS: 1.0000 | ORAL_TABLET | Freq: Two times a day (BID) | ORAL | Status: AC

## 2011-08-22 NOTE — Discharge Summary (Signed)
DISCHARGE SUMMARY  Collin [name]  MR#: [id number]  DOB:04/08/1941  Date of Admission: 08/20/2011 Date of Discharge: 08/22/2011  Attending Physician:Chauntel Windsor  Patient's WUJ:WJXBJYNWG,NFAOZ W, MD  Consults: none  Discharge Diagnoses: Present on Admission:  .Atrial fibrillation .Nonischemic cardiomyopathy .Type 2 diabetes mellitus .Hypotension secondary to dehydration .Acute renal failure Upper respiratory Infection    Current Discharge Medication List    START taking these medications   Details  amoxicillin-clavulanate (AUGMENTIN) 875-125 MG per tablet Take 1 tablet by mouth every 12 (twelve) hours. Qty: 12 tablet, Refills: 0    guaiFENesin (MUCINEX) 600 MG 12 hr tablet Take 1 tablet (600 mg total) by mouth 2 (two) times daily. Qty: 14 tablet, Refills: 0      CONTINUE these medications which have NOT CHANGED   Details  carvedilol (COREG) 25 MG tablet Take 1 tablet (25 mg total) by mouth 2 (two) times daily with a meal. Qty: 180 tablet, Refills: 3   Associated Diagnoses: Nonischemic cardiomyopathy    digoxin (LANOXIN) 0.25 MG tablet Take 1 tablet (250 mcg total) by mouth daily. Qty: 90 tablet, Refills: 3   Associated Diagnoses: Nonischemic cardiomyopathy    gemfibrozil (LOPID) 600 MG tablet Take 1 tablet (600 mg total) by mouth 2 (two) times daily before a meal. Qty: 180 tablet, Refills: 3   Associated Diagnoses: Hyperlipidemia    loratadine (CLARITIN) 10 MG tablet Take 10 mg by mouth daily.      metFORMIN (GLUCOPHAGE) 500 MG tablet Take 1 tablet (500 mg total) by mouth 2 (two) times daily with a meal. Qty: 180 tablet, Refills: 3   Associated Diagnoses: Type 2 diabetes mellitus    niacin (NIASPAN) 500 MG CR tablet Take 1 tablet (500 mg total) by mouth at bedtime. Qty: 90 tablet, Refills: 3   Associated Diagnoses: Pure hypercholesterolemia    pravastatin (PRAVACHOL) 40 MG tablet Take 1 tablet (40 mg total) by mouth every evening. Qty: 90 tablet, Refills:  3   Associated Diagnoses: Pure hypercholesterolemia    ramipril (ALTACE) 5 MG capsule Take 1 capsule (5 mg total) by mouth daily. Qty: 90 capsule, Refills: 3    spironolactone (ALDACTONE) 25 MG tablet Take 1 tablet (25 mg total) by mouth daily. Qty: 90 tablet, Refills: 3    warfarin (COUMADIN) 5 MG tablet Take 5 mg by mouth daily.       STOP taking these medications     amoxicillin (AMOXIL) 500 MG capsule           Hospital Course: Hypotension (08/20/2011) Probably secondary to dehydration in the setting of URI . Pt is appropriately hydrated and his BP improved.  His cardiac enzymes are negative. EKG unchanged. His symptoms improved and he is discharged home .  Atrial fibrillation (12/14/2010) Rate controlled. resume his home meds.  Nonischemic cardiomyopathy (12/14/2010) Continue with his home meds.  Type 2 diabetes mellitus (12/14/2010) Initially metformin was held for worsened renal function, and was put on SSI. As his renal function improved his metformin was resumed. Acute renal failure (08/20/2011) On admission pt was found to have acute renal failure, probably secondary to dehydration. He was rehydrated appropriately and his renal function recovered to normal. URI: complete the course of antibiotics.     Day of Discharge BP 112/71  Pulse 90  Temp(Src) 98.4 F (36.9 C) (Oral)  Resp 20  Ht 6\' 1"  (1.854 m)  Wt 125.1 kg (275 lb 12.7 oz)  BMI 36.39 kg/m2  SpO2 96%  Physical Exam:  On exam  Pt is  alert afebrile and comfortable  CVS S1 and S2 heard  Lungs clear  Abdomen: obese, small umbilical hernia, non tender bowel sounds heard  Extremities: trace edema   Results for orders placed during the hospital encounter of 08/20/11 (from the past 24 hour(s))  GLUCOSE, CAPILLARY     Status: Abnormal   Collection Time   08/21/11  4:57 PM      Component Value Range   Glucose-Capillary 124 (*) 70 - 99 (mg/dL)  GLUCOSE, CAPILLARY     Status: Abnormal   Collection Time     08/21/11  7:05 PM      Component Value Range   Glucose-Capillary 169 (*) 70 - 99 (mg/dL)   Comment 1 Notify RN    GLUCOSE, CAPILLARY     Status: Normal   Collection Time   08/21/11 10:44 PM      Component Value Range   Glucose-Capillary 99  70 - 99 (mg/dL)  PROTIME-INR     Status: Abnormal   Collection Time   08/22/11  5:23 AM      Component Value Range   Prothrombin Time 17.2 (*) 11.6 - 15.2 (seconds)   INR 1.38  0.00 - 1.49   GLUCOSE, CAPILLARY     Status: Abnormal   Collection Time   08/22/11  7:17 AM      Component Value Range   Glucose-Capillary 115 (*) 70 - 99 (mg/dL)  CBC     Status: Abnormal   Collection Time   08/22/11 11:15 AM      Component Value Range   WBC 3.6 (*) 4.0 - 10.5 (K/uL)   RBC 5.04  4.22 - 5.81 (MIL/uL)   Hemoglobin 15.2  13.0 - 17.0 (g/dL)   HCT 23.5  57.3 - 22.0 (%)   MCV 90.5  78.0 - 100.0 (fL)   MCH 30.2  26.0 - 34.0 (pg)   MCHC 33.3  30.0 - 36.0 (g/dL)   RDW 25.4  27.0 - 62.3 (%)   Platelets 203  150 - 400 (K/uL)  BASIC METABOLIC PANEL     Status: Abnormal   Collection Time   08/22/11 11:15 AM      Component Value Range   Sodium 137  135 - 145 (mEq/L)   Potassium 4.2  3.5 - 5.1 (mEq/L)   Chloride 100  96 - 112 (mEq/L)   CO2 26  19 - 32 (mEq/L)   Glucose, Bld 154 (*) 70 - 99 (mg/dL)   BUN 19  6 - 23 (mg/dL)   Creatinine, Ser 7.62  0.50 - 1.35 (mg/dL)   Calcium 83.1  8.4 - 10.5 (mg/dL)   GFR calc non Af Amer 86 (*) >90 (mL/min)   GFR calc Af Amer >90  >90 (mL/min)  GLUCOSE, CAPILLARY     Status: Abnormal   Collection Time   08/22/11 11:25 AM      Component Value Range   Glucose-Capillary 142 (*) 70 - 99 (mg/dL)   Comment 1 Notify RN      Disposition: home   Follow-up Appts: Discharge Orders    Future Appointments: Provider: Department: Dept Phone: Center:   08/23/2011 2:00 PM [facility] [phone] [facility]   10/14/2011 10:00 AM Bruce W Burchette [facility] [phone] [facility]     Future Orders  Please Complete By Expires   Diet - low sodium heart healthy      Increase activity slowly      Discharge instructions      Comments:   Follow up  with cardiology in 2 weeks.      SignedKathlen Mody 08/22/2011, 2:46 PM

## 2011-08-22 NOTE — Progress Notes (Signed)
Nurse paged me to visit with pt.  Pt's daughter-in-law's father died suddenly.  Pt was mourning loss of this death and was not sure how he could help his young grandchildren, son, and daughter-in-law deal with this tragic loss.  I provided presence, emotional support, spiritual support and grief support.  We ended the visit with prayer.  Please page if needed or requested.  08/22/11 1237  Clinical Encounter Type  Visited With Patient  Visit Type Spiritual support;Psychological support  Referral From Nurse  Spiritual Encounters  Spiritual Needs Prayer;Emotional;Grief support  Stress Factors  Patient Stress Factors Family relationships;Loss;Major life changes  Family Stress Factors Loss    Lena Ruthann Angulo  [phone]

## 2011-08-22 NOTE — Progress Notes (Signed)
ANTICOAGULATION CONSULT NOTE - Follow Up  Pharmacy Consult for Warfarin Indication: atrial fibrillation  Allergies  Allergen Reactions  . Niaspan (Niacin (Antihyperlipidemic)) Other (See Comments)    flushes    Patient Measurements: Height: 6\' 1"  (185.4 cm) Weight: 275 lb 12.7 oz (125.1 kg) IBW/kg (Calculated) : 79.9  Adjusted Body Weight:   Vital Signs: Temp: 97.4 F (36.3 C) (12/16 0544) Temp src: Oral (12/16 0544) BP: 133/83 mmHg (12/16 0544) Pulse Rate: 97  (12/16 0544)  Labs:  Basename 08/22/11 0523 08/21/11 1130 08/21/11 0540 08/21/11 0200 08/20/11 1651  HGB -- -- 14.0 -- 14.7  HCT -- -- 41.2 -- 42.8  PLT -- -- 182 -- 196  APTT -- -- -- -- 38*  LABPROT 17.2* -- 20.6* -- 19.6*  INR 1.38 -- 1.73* -- 1.63*  HEPARINUNFRC -- -- -- -- --  CREATININE -- -- 1.02 -- 1.55*  CKTOTAL -- 117 104 89 --  CKMB -- 2.6 2.4 1.9 --  TROPONINI -- <0.30 <0.30 <0.30 --   Estimated Creatinine Clearance: 93.4 ml/min (by C-G formula based on Cr of 1.02).  Medical History: Past Medical History  Diagnosis Date  . Diabetes mellitus   . Heart disease   . Hyperlipidemia   . Cardiomyopathy, nonischemic 1998  . Atrial fibrillation   . OSA (obstructive sleep apnea)    Assessment:  70 YOM on chronic warfarin PTA for h/o atrial fibrillation  Home dose = 5 mg po daily  Admitted with subtherapeutic INR (1.63) and coumadin 5 mg po daily was resumed - first dose to start 12/15, dose boosted 7.5mg  due to subtherapeutic INR  Last dose of coumadin 12/13 so pt missed 12/14 dose.  INR falling, 1.38 this AM  No bleeding/complications reported   Augmentin started 12/15 - very mild potential to interact  Goal of Therapy:  INR 2-3   Plan:   Increase Coumadin 10mg  po x 1 today  MD to advise if lovenox bridge needed  Daily PT/INR   Loralee Pacas R 08/22/2011,8:48 AM Pager: [phone]

## 2011-08-23 ENCOUNTER — Ambulatory Visit: Payer: Medicare PPO

## 2011-08-23 DIAGNOSIS — I4891 Unspecified atrial fibrillation: Secondary | ICD-10-CM

## 2011-08-23 LAB — POCT INR: INR: 1.5

## 2011-08-23 NOTE — Patient Instructions (Signed)
  Latest dosing instructions   Total Sun Mon Tue Wed Thu Fri Sat   30 5 mg 2.5 mg 5 mg 2.5 mg 5 mg 5 mg 5 mg    (5 mg1) (5 mg0.5) (5 mg1) (5 mg0.5) (5 mg1) (5 mg1) (5 mg1)        

## 2011-08-25 ENCOUNTER — Ambulatory Visit (INDEPENDENT_AMBULATORY_CARE_PROVIDER_SITE_OTHER): Payer: Medicare PPO | Admitting: Family Medicine

## 2011-08-25 ENCOUNTER — Encounter: Payer: Self-pay | Admitting: Family Medicine

## 2011-08-25 DIAGNOSIS — I959 Hypotension, unspecified: Secondary | ICD-10-CM

## 2011-08-25 DIAGNOSIS — I4891 Unspecified atrial fibrillation: Secondary | ICD-10-CM

## 2011-08-25 DIAGNOSIS — I428 Other cardiomyopathies: Secondary | ICD-10-CM

## 2011-08-25 NOTE — Progress Notes (Signed)
Subjective:    Patient ID: Collin [name], male    DOB: [birth year], 70 y.o.   MRN: [id number]  HPI  Patient seen for hospital followup. He had recent upper respiratory infection and was placed on antibiotic. He been around several grandchildren who have been sick. On 08/20/2011 patient became dizzy and lightheaded. No syncope. Wife checked blood pressure and was 70 systolic. EMS was called and by their blood pressure 80/50. Capillary blood glucose 167. Patient was admitted and given IV fluids and blood pressure improved. Lab work revealed no evidence for anemia. Electrolytes significant for creatinine 1.5 with BUN of 26 and this improved with IV fluids. Normal digoxin level. Chest x-ray revealed cardiomegaly without any acute pulmonary edema.  Patient has chronic atrial fibrillation and history of nonischemic cardiomyopathy. He is maintained on Coumadin. He takes Lanoxin, Coreg 25 mg twice a day , Altace 5 mg daily, and Aldactone 25 mg daily in addition to other medications. Blood sugars have been stable. Dizziness is exacerbated by position change frequently though symptoms are inconsistent.  Drinking fluids fairly well since discharge. No recent nausea, vomiting, or diarrhea.  Recent thyroid normal. Diabetes has been well-controlled.  Past Medical History  Diagnosis Date  . Diabetes mellitus   . Heart disease   . Hyperlipidemia   . Cardiomyopathy, nonischemic 1998  . Atrial fibrillation   . OSA (obstructive sleep apnea)    Past Surgical History  Procedure Date  . Cardiac defibrillator placement 2006    reports that he has never smoked. He does not have any smokeless tobacco history on file. He reports that he does not drink alcohol. His drug history not on file. family history includes Heart disease in his mother; Hypertension in his father; Lupus in his mother; Stroke in his father; and Sudden death in his mother. Allergies  Allergen Reactions  . Niaspan (Niacin (Antihyperlipidemic))  Other (See Comments)    flushes      Review of Systems  Constitutional: Negative for fever, chills, appetite change and unexpected weight change.  Respiratory: Negative for cough and shortness of breath.   Cardiovascular: Negative for chest pain.  Gastrointestinal: Negative for abdominal pain.  Genitourinary: Negative for dysuria and frequency.  Neurological: Positive for dizziness. Negative for seizures, syncope and weakness.  Psychiatric/Behavioral: Negative for confusion.       Objective:   Physical Exam  Constitutional: He is oriented to person, place, and time. He appears well-developed and well-nourished. No distress.  HENT:  Mouth/Throat: Oropharynx is clear and moist.  Neck: Neck supple. No thyromegaly present.  Cardiovascular: Normal rate and regular rhythm.   Pulmonary/Chest: Effort normal and breath sounds normal. No respiratory distress. He has no wheezes. He has no rales.  Musculoskeletal: He exhibits no edema.  Lymphadenopathy:    He has no cervical adenopathy.  Neurological: He is alert and oriented to person, place, and time. No cranial nerve deficit.  Psychiatric: He has a normal mood and affect. His behavior is normal.          Assessment & Plan:  #1 hypotension. Patient might have had some recent dehydration contributing. Question component of autonomic instability with his diabetes. Hold Aldactone for now. Increase hydration. Continue Coreg and Ramipril.  Schedule followup with cardiology soon #2 type 2 diabetes which has been well controlled #3 atrial fibrillation rate controlled

## 2011-08-25 NOTE — Patient Instructions (Signed)
Hold spironolactone for now. Make sure you stay well hydrated. Make sure you keep follow up with cardiology as scheduled.

## 2011-08-30 ENCOUNTER — Ambulatory Visit: Payer: Medicare PPO

## 2011-09-08 ENCOUNTER — Encounter: Payer: Self-pay | Admitting: Physician Assistant

## 2011-09-09 ENCOUNTER — Encounter: Payer: Self-pay | Admitting: Physician Assistant

## 2011-09-09 ENCOUNTER — Ambulatory Visit (INDEPENDENT_AMBULATORY_CARE_PROVIDER_SITE_OTHER): Payer: Medicare PPO | Admitting: Physician Assistant

## 2011-09-09 VITALS — BP 136/78 | Resp 18 | Ht 73.0 in | Wt 295.0 lb

## 2011-09-09 DIAGNOSIS — I4891 Unspecified atrial fibrillation: Secondary | ICD-10-CM

## 2011-09-09 DIAGNOSIS — I428 Other cardiomyopathies: Secondary | ICD-10-CM

## 2011-09-09 DIAGNOSIS — Z95810 Presence of automatic (implantable) cardiac defibrillator: Secondary | ICD-10-CM

## 2011-09-09 DIAGNOSIS — I959 Hypotension, unspecified: Secondary | ICD-10-CM

## 2011-09-09 DIAGNOSIS — I5022 Chronic systolic (congestive) heart failure: Secondary | ICD-10-CM

## 2011-09-09 NOTE — Assessment & Plan Note (Signed)
Resolved.  I advised him that we do restart his spironolactone, he should hold this in the future if he develops any type of illness.  He seems to be at risk for dehydration.

## 2011-09-09 NOTE — Assessment & Plan Note (Signed)
Volume stable.  He is on good medical regimen.  Recent echocardiogram demonstrated normal LV function.  Spironolactone has been held.  I will check a basic metabolic panel today.  If his renal function and potassium are now normal, I will try to reinitiate spironolactone at one half tablet daily.  Followup with Dr. Tenny Craw in 2-3 months.

## 2011-09-09 NOTE — Progress Notes (Signed)
[address] [place], [place]  [address] Phone: [phone] Fax:  [phone]  Date:  09/09/2011   Name:  Collin [name]       DOB:  05-27-1941 MRN:  [id number]  PCP:  Dr. Caryl Never Primary Cardiologist:  [name]  Primary Electrophysiologist:  [name]    History of Present Illness: Collin [name] is a 71 y.o. male who presents for follow up.  He has a history of NICM, systolic heart failure, atrial fibrillation, diabetes, hyperlipidemia, sleep apnea.  He was established with [name] in 4/12.  He moved to [place] from [place] in 2011.  He has a history of normal coronary arteries by cardiac catheterization in 2002.  Last echo 5/12: mod LVH, EF 50-55%, moderate LAE, mild RAE.  He was recently admitted for hypotension and acute renal insufficiency.  This was in the context of a recent URI and he was felt to be dehydrated.  His medications were adjusted and he was hydrated.  His spironolactone was held.  Chest x-ray was negative.  Labs: Potassium 4.2, creatinine 0.86 (initial creatinine 1.55), cardiac markers negative x3, hemoglobin 15.2.  Doing better now.  Cough is almost resolved.  Denies chest pain, significant DOE, orthopnea, PND or significant edema.  No syncope or near syncope.    Past Medical History  Diagnosis Date  . Diabetes mellitus   . Heart disease   . Hyperlipidemia   . Cardiomyopathy, nonischemic 1998  . Campath-induced atrial fibrillation   . OSA (obstructive sleep apnea)     Current Outpatient Prescriptions  Medication Sig Dispense Refill  . carvedilol (COREG) 25 MG tablet Take 1 tablet (25 mg total) by mouth 2 (two) times daily with a meal.  180 tablet  3  . digoxin (LANOXIN) 0.25 MG tablet Take 1 tablet (250 mcg total) by mouth daily.  90 tablet  3  . gemfibrozil (LOPID) 600 MG tablet Take 1 tablet (600 mg total) by mouth 2 (two) times daily before a meal.  180 tablet  3  . metFORMIN (GLUCOPHAGE) 500 MG tablet  Take 1 tablet (500 mg total) by mouth 2 (two) times daily with a meal.  180 tablet  3  . niacin (NIASPAN) 500 MG CR tablet Take 1 tablet (500 mg total) by mouth at bedtime.  90 tablet  3  . pravastatin (PRAVACHOL) 40 MG tablet Take 1 tablet (40 mg total) by mouth every evening.  90 tablet  3  . ramipril (ALTACE) 5 MG capsule Take 1 capsule (5 mg total) by mouth daily.  90 capsule  3  . warfarin (COUMADIN) 5 MG tablet Take 5 mg by mouth daily.         Allergies: Allergies  Allergen Reactions  . Niaspan (Niacin (Antihyperlipidemic)) Other (See Comments)    flushes    History  Substance Use Topics  . Smoking status: Never Smoker   . Smokeless tobacco: Not on file  . Alcohol Use: No     ROS:  Please see the history of present illness.   All other systems reviewed and negative.   PHYSICAL EXAM: VS:  BP 136/78  Resp 18  Ht 6\' 1"  (1.854 m)  Wt 295 lb (133.811 kg)  BMI 38.92 kg/m2 Well nourished, well developed, in no acute distress HEENT: normal Neck: no JVD at 90 degrees Cardiac:  normal S1, S2; irreg irreg rhythm; no murmur Lungs:  clear to auscultation bilaterally, no wheezing, rhonchi or rales Abd: soft, nontender, no  hepatomegaly Ext: trace bilateral ankle edema Skin: warm and dry Neuro:  CNs 2-12 intact, no focal abnormalities noted  EKG:  Atrial fibrillation, heart rate 83, left axis deviation, nonspecific ST-T wave changes  ASSESSMENT AND PLAN:

## 2011-09-09 NOTE — Patient Instructions (Signed)
Your physician recommends that you have lab work today: [facility]  Your physician recommends that you schedule a follow-up appointment in: 1-2 MONTHS with Dr [name], 2-3 MONTHS with Dr Tenny Craw  Your physician recommends that you continue on your current medications as directed. Please refer to the Current Medication list given to you today.

## 2011-09-09 NOTE — Assessment & Plan Note (Signed)
Followup with Dr. [name] in one to 2 months.

## 2011-09-09 NOTE — Assessment & Plan Note (Signed)
Rate controlled.  Coumadin managed by his PCP.

## 2011-09-10 ENCOUNTER — Telehealth: Payer: Self-pay | Admitting: *Deleted

## 2011-09-10 DIAGNOSIS — I509 Heart failure, unspecified: Secondary | ICD-10-CM

## 2011-09-10 LAB — BASIC METABOLIC PANEL
BUN: 22 mg/dL (ref 6–23)
CO2: 27 mEq/L (ref 19–32)
Calcium: 9.3 mg/dL (ref 8.4–10.5)
Chloride: 106 mEq/L (ref 96–112)
Creatinine, Ser: 1 mg/dL (ref 0.4–1.5)
GFR: 83.17 mL/min (ref >60.00)
Glucose, Bld: 125 mg/dL — ABNORMAL HIGH (ref 70–99)
Potassium: 4.6 mEq/L (ref 3.5–5.1)
Sodium: 141 mEq/L (ref 135–145)

## 2011-09-10 MED ORDER — SPIRONOLACTONE 25 MG PO TABS: 0.1250 mg | ORAL_TABLET | Freq: Every day | ORAL | Status: DC

## 2011-09-10 NOTE — Telephone Encounter (Signed)
Message copied by Antony Odea on Fri Sep 10, 2011  1:39 PM ------      Message from: [place], [place] T      Created: Fri Sep 10, 2011  1:34 PM       Ok to restart Spironolactone 25 mg take 1/2 tab daily      Repeat BMET one week later.      Tereso Newcomer, PA-C  1:34 PM 09/10/2011

## 2011-09-10 NOTE — Telephone Encounter (Signed)
Labs given, told him to restart spironolactone at half dose, pt had meds avail and would cut in half. Lab draw set.

## 2011-09-17 ENCOUNTER — Ambulatory Visit (INDEPENDENT_AMBULATORY_CARE_PROVIDER_SITE_OTHER): Payer: Medicare PPO | Admitting: *Deleted

## 2011-09-17 DIAGNOSIS — I428 Other cardiomyopathies: Secondary | ICD-10-CM

## 2011-09-17 DIAGNOSIS — E785 Hyperlipidemia, unspecified: Secondary | ICD-10-CM

## 2011-09-17 DIAGNOSIS — I4891 Unspecified atrial fibrillation: Secondary | ICD-10-CM

## 2011-09-17 LAB — BASIC METABOLIC PANEL
BUN: 17 mg/dL (ref 6–23)
CO2: 26 mEq/L (ref 19–32)
Calcium: 9.2 mg/dL (ref 8.4–10.5)
Chloride: 103 mEq/L (ref 96–112)
Creatinine, Ser: 1 mg/dL (ref 0.4–1.5)
GFR: 76.61 mL/min (ref >60.00)
Glucose, Bld: 174 mg/dL — ABNORMAL HIGH (ref 70–99)
Potassium: 4.1 mEq/L (ref 3.5–5.1)
Sodium: 140 mEq/L (ref 135–145)

## 2011-09-30 ENCOUNTER — Ambulatory Visit (INDEPENDENT_AMBULATORY_CARE_PROVIDER_SITE_OTHER): Payer: Medicare PPO

## 2011-09-30 DIAGNOSIS — Z7901 Long term (current) use of anticoagulants: Secondary | ICD-10-CM

## 2011-09-30 DIAGNOSIS — I4891 Unspecified atrial fibrillation: Secondary | ICD-10-CM

## 2011-09-30 LAB — POCT INR: INR: 2.1

## 2011-09-30 NOTE — Patient Instructions (Signed)
  Latest dosing instructions   Total Sun Mon Tue Wed Thu Fri Sat   30 5 mg 2.5 mg 5 mg 2.5 mg 5 mg 5 mg 5 mg    (5 mg1) (5 mg0.5) (5 mg1) (5 mg0.5) (5 mg1) (5 mg1) (5 mg1)        

## 2011-10-14 ENCOUNTER — Ambulatory Visit (INDEPENDENT_AMBULATORY_CARE_PROVIDER_SITE_OTHER): Payer: Medicare PPO | Admitting: Family Medicine

## 2011-10-14 ENCOUNTER — Encounter: Payer: Self-pay | Admitting: Family Medicine

## 2011-10-14 VITALS — BP 100/70 | Temp 98.5°F | Wt 292.0 lb

## 2011-10-14 DIAGNOSIS — E119 Type 2 diabetes mellitus without complications: Secondary | ICD-10-CM

## 2011-10-14 DIAGNOSIS — E1149 Type 2 diabetes mellitus with other diabetic neurological complication: Secondary | ICD-10-CM

## 2011-10-14 DIAGNOSIS — G609 Hereditary and idiopathic neuropathy, unspecified: Secondary | ICD-10-CM

## 2011-10-14 DIAGNOSIS — E1142 Type 2 diabetes mellitus with diabetic polyneuropathy: Secondary | ICD-10-CM | POA: Insufficient documentation

## 2011-10-14 LAB — HEMOGLOBIN A1C: Hgb A1c MFr Bld: 6.7 % — ABNORMAL HIGH (ref 4.6–6.5)

## 2011-10-14 NOTE — Progress Notes (Signed)
Subjective:    Patient ID: Collin [name], male    DOB: April 09, 1941, 71 y.o.   MRN: [id number]  HPI  Medical followup. Patient has history of type 2 diabetes, congestive heart failure, atrial fibrillation, hyperlipidemia, and implantable cardiac defibrillator secondary to nonischemic cardiomyopathy and obstructive sleep apnea. Has hx of some problems intermittently with orthostasis. Wife held spirinolactone recently and plans to start back. He remains on Coumadin for atrial fibrillation with no recent bleeding complications. Recent INR at goal.  Diabetes not checked regularly. Fasting blood sugars fairly well controlled. Last A1c 6.6%. No eye exam in over one year. Occasional numbness great toe. Patient has declined flu vaccines. He denies any orthopnea or chest pains  Past Medical History  Diagnosis Date  . Diabetes mellitus   . Heart disease   . Hyperlipidemia   . Cardiomyopathy, nonischemic 1998  . Atrial fibrillation   . OSA (obstructive sleep apnea)    Past Surgical History  Procedure Date  . Cardiac defibrillator placement 2006    reports that he has never smoked. He does not have any smokeless tobacco history on file. He reports that he does not drink alcohol. His drug history not on file. family history includes Heart disease in his mother; Hypertension in his father; Lupus in his mother; Stroke in his father; and Sudden death in his mother. Allergies  Allergen Reactions  . Niaspan (Niacin (Antihyperlipidemic)) Other (See Comments)    flushes      Review of Systems  Constitutional: Negative for fatigue.  Eyes: Negative for visual disturbance.  Respiratory: Negative for cough, chest tightness and shortness of breath.   Cardiovascular: Negative for chest pain, palpitations and leg swelling.  Neurological: Negative for dizziness, syncope, weakness, light-headedness and headaches.       Objective:   Physical Exam  Constitutional: He is oriented to person, place, and  time. He appears well-developed and well-nourished.  HENT:  Mouth/Throat: Oropharynx is clear and moist.  Neck: Neck supple.  Cardiovascular: Normal rate and normal heart sounds.        Irregular rhythm  Pulmonary/Chest: Effort normal and breath sounds normal. No respiratory distress. He has no wheezes. He has no rales.  Musculoskeletal:       Feet reveal no skin lesions. Good distal foot pulses. Good capillary refill. No calluses. Impaired sensation right and left great toe monofilament testing.   Neurological: He is alert and oriented to person, place, and time. No cranial nerve deficit.  Psychiatric: He has a normal mood and affect. His behavior is normal.          Assessment & Plan:  #1 type 2 diabetes. History of good control. Needs to set up eye exam soon.  needs to work on weight loss. Recheck A1c  #2 chronic atrial fibrillation rate controlled. Continue close INR monitoring  #3 nonischemic cardiomyopathy currently stable. Patient will try to reinitiate low-dose spirinolactone. Recent basic metabolic panel stable

## 2011-10-14 NOTE — Patient Instructions (Signed)
Set up eye exam. Work on weight loss

## 2011-11-01 ENCOUNTER — Ambulatory Visit (INDEPENDENT_AMBULATORY_CARE_PROVIDER_SITE_OTHER): Payer: Medicare PPO | Admitting: Family Medicine

## 2011-11-01 DIAGNOSIS — I4891 Unspecified atrial fibrillation: Secondary | ICD-10-CM

## 2011-11-01 LAB — POCT INR: INR: 2.2

## 2011-11-01 NOTE — Patient Instructions (Signed)
  Latest dosing instructions   Total Sun Mon Tue Wed Thu Fri Sat   30 5 mg 2.5 mg 5 mg 2.5 mg 5 mg 5 mg 5 mg    (5 mg1) (5 mg0.5) (5 mg1) (5 mg0.5) (5 mg1) (5 mg1) (5 mg1)        

## 2011-11-10 ENCOUNTER — Encounter: Payer: Medicare PPO | Admitting: Internal Medicine

## 2011-11-17 ENCOUNTER — Encounter: Payer: Self-pay | Admitting: Family Medicine

## 2011-11-17 ENCOUNTER — Ambulatory Visit (INDEPENDENT_AMBULATORY_CARE_PROVIDER_SITE_OTHER): Payer: Medicare PPO | Admitting: Family Medicine

## 2011-11-17 ENCOUNTER — Ambulatory Visit (INDEPENDENT_AMBULATORY_CARE_PROVIDER_SITE_OTHER)
Admission: RE | Admit: 2011-11-17 | Discharge: 2011-11-17 | Disposition: A | Discharge status: Home / Self Care | Payer: Medicare PPO | Source: Ambulatory Visit | Attending: Family Medicine | Admitting: Family Medicine

## 2011-11-17 VITALS — BP 130/88 | HR 100 | Temp 102.8°F | Resp 15 | Wt 288.0 lb

## 2011-11-17 DIAGNOSIS — R0609 Other forms of dyspnea: Secondary | ICD-10-CM

## 2011-11-17 DIAGNOSIS — R509 Fever, unspecified: Secondary | ICD-10-CM

## 2011-11-17 DIAGNOSIS — R0600 Dyspnea, unspecified: Secondary | ICD-10-CM

## 2011-11-17 DIAGNOSIS — R0989 Other specified symptoms and signs involving the circulatory and respiratory systems: Secondary | ICD-10-CM

## 2011-11-17 DIAGNOSIS — R05 Cough: Secondary | ICD-10-CM

## 2011-11-17 DIAGNOSIS — E119 Type 2 diabetes mellitus without complications: Secondary | ICD-10-CM

## 2011-11-17 DIAGNOSIS — R059 Cough, unspecified: Secondary | ICD-10-CM

## 2011-11-17 DIAGNOSIS — I428 Other cardiomyopathies: Secondary | ICD-10-CM

## 2011-11-17 MED ORDER — ALBUTEROL SULFATE HFA 108 (90 BASE) MCG/ACT IN AERS: 2.0000 | INHALATION_SPRAY | RESPIRATORY_TRACT | Status: DC | PRN

## 2011-11-17 MED ORDER — METHYLPREDNISOLONE ACETATE 80 MG/ML IJ SUSP
80.0000 mg | Freq: Once | INTRAMUSCULAR | Status: AC
  Administered 2011-11-17: 80 mg via INTRAMUSCULAR

## 2011-11-17 MED ORDER — LEVOFLOXACIN 500 MG PO TABS: 500.0000 mg | ORAL_TABLET | Freq: Every day | ORAL | Status: AC

## 2011-11-17 NOTE — Progress Notes (Signed)
Subjective:    Patient ID: Collin [name], male    DOB: Nov 06, 1940, 71 y.o.   MRN: [id number]  HPI  Acute visit. Onset of cough about one week ago. Progressive over the past week. He had difficulty sleeping secondary cough and dyspnea with activity but also at rest. Cough productive of yellow sputum. Intermittent chills. Temperature not taken. Wheezing off and on. No hemoptysis. No history of asthma. No history of smoking.  Chronic problems include obesity, obstructive sleep apnea, history of nonischemic cardio myopathy, atrial fibrillation, type 2 diabetes, chronic Coumadin therapy  Past Medical History  Diagnosis Date  . Diabetes mellitus   . Heart disease   . Hyperlipidemia   . Cardiomyopathy, nonischemic 1998  . Atrial fibrillation   . OSA (obstructive sleep apnea)    Past Surgical History  Procedure Date  . Cardiac defibrillator placement 2006    reports that he has never smoked. He does not have any smokeless tobacco history on file. He reports that he does not drink alcohol. His drug history not on file. family history includes Heart disease in his mother; Hypertension in his father; Lupus in his mother; Stroke in his father; and Sudden death in his mother. Allergies  Allergen Reactions  . Niaspan (Niacin (Antihyperlipidemic)) Other (See Comments)    flushes     Review of Systems  Constitutional: Positive for fever, chills and fatigue. Negative for unexpected weight change.  HENT: Negative for congestion, sore throat and sinus pressure.   Respiratory: Positive for cough, shortness of breath and wheezing.   Gastrointestinal: Negative for abdominal pain.  Genitourinary: Negative for dysuria.  Neurological: Positive for weakness and headaches. Negative for syncope.  Hematological: Negative for adenopathy.       Objective:   Physical Exam  Constitutional:       Coughing frequently during exam. In no distress otherwise  HENT:  Right Ear: External ear normal.  Left  Ear: External ear normal.  Mouth/Throat: Oropharynx is clear and moist.  Neck: Neck supple. No thyromegaly present.  Cardiovascular:       Irregular rhythm rate 100  Pulmonary/Chest:       Patient has diffuse wheezes. Slightly decreased breath sounds left base. No rales  Abdominal: Soft. There is no tenderness.  Musculoskeletal: He exhibits no edema.  Lymphadenopathy:    He has no cervical adenopathy.  Skin: No rash noted.          Assessment & Plan:  Cough, fever, and mild dyspnea. Pulse oximetry 93% at rest. Rule out left lower lobe pneumonia. Chest x-ray obtained. Patient has reactive airway component. Proventil 2 puffs every 6 hours when necessary. Depo-Medrol 80 mg given. Start Levaquin 500 milligrams once daily for 10 days. Reassess one week and sooner as needed Chronic atrial fibrillation with rate up slightly probably related to fever.

## 2011-11-17 NOTE — Patient Instructions (Signed)
Follow up promptly for any increased shortness of breath, vomiting, or any other worsening sympoms

## 2011-11-18 NOTE — Progress Notes (Signed)
Quick Note:  Pt wife informed ______ 

## 2011-11-24 ENCOUNTER — Encounter: Payer: Self-pay | Admitting: Family Medicine

## 2011-11-24 ENCOUNTER — Ambulatory Visit (INDEPENDENT_AMBULATORY_CARE_PROVIDER_SITE_OTHER): Payer: Medicare PPO | Admitting: Family Medicine

## 2011-11-24 VITALS — BP 96/68 | HR 90 | Temp 98.0°F | Wt 290.0 lb

## 2011-11-24 DIAGNOSIS — R059 Cough, unspecified: Secondary | ICD-10-CM

## 2011-11-24 DIAGNOSIS — R05 Cough: Secondary | ICD-10-CM

## 2011-11-24 MED ORDER — HYDROCODONE-HOMATROPINE 5-1.5 MG/5ML PO SYRP: 5.0000 mL | ORAL_SOLUTION | Freq: Four times a day (QID) | ORAL | Status: AC | PRN

## 2011-11-24 NOTE — Progress Notes (Signed)
Subjective:    Patient ID: Conwell Vito, male    DOB: [birth year], 71 y.o.   MRN: [id number]  HPI  One-week followup. Patient presented with reactive airway disease and high fever. We considered possible pneumonia though chest x-ray did not confirm this. He was treated with steroids, and albuterol inhaler, and Levaquin.  Fever resolved after just one day. Overall feels much better. No wheezing. Still has nighttime cough which is bothersome. No relief with over-the-counter medications. No orthostasis. No dyspnea with exertion   Review of Systems  Constitutional: Negative for fever, chills, appetite change and unexpected weight change.  Respiratory: Positive for cough. Negative for shortness of breath and wheezing.   Cardiovascular: Negative for chest pain.       Objective:   Physical Exam  Constitutional: He appears well-developed and well-nourished.  Cardiovascular: Normal rate and regular rhythm.   Pulmonary/Chest: Effort normal and breath sounds normal. No respiratory distress. He has no wheezes. He has no rales.  Musculoskeletal: He exhibits no edema.          Assessment & Plan:  Cough with reactive airway component. Improved. Pulse oximetry 96%. Finish out antibiotic. Prescription for Hycodan cough syrup 1 teaspoon each bedtime when necessary

## 2011-11-29 ENCOUNTER — Ambulatory Visit (INDEPENDENT_AMBULATORY_CARE_PROVIDER_SITE_OTHER): Payer: Medicare PPO | Admitting: Family Medicine

## 2011-11-29 DIAGNOSIS — I4891 Unspecified atrial fibrillation: Secondary | ICD-10-CM

## 2011-11-29 LAB — POCT INR: INR: 2.1

## 2011-11-29 NOTE — Patient Instructions (Signed)
  Latest dosing instructions   Total Sun Mon Tue Wed Thu Fri Sat   30 5 mg 2.5 mg 5 mg 2.5 mg 5 mg 5 mg 5 mg    (5 mg1) (5 mg0.5) (5 mg1) (5 mg0.5) (5 mg1) (5 mg1) (5 mg1)        

## 2011-11-30 ENCOUNTER — Encounter (INDEPENDENT_AMBULATORY_CARE_PROVIDER_SITE_OTHER): Payer: Medicare PPO | Admitting: Ophthalmology

## 2011-11-30 DIAGNOSIS — H2510 Age-related nuclear cataract, unspecified eye: Secondary | ICD-10-CM

## 2011-11-30 DIAGNOSIS — H43819 Vitreous degeneration, unspecified eye: Secondary | ICD-10-CM

## 2011-11-30 DIAGNOSIS — I10 Essential (primary) hypertension: Secondary | ICD-10-CM

## 2011-11-30 DIAGNOSIS — H35039 Hypertensive retinopathy, unspecified eye: Secondary | ICD-10-CM

## 2011-11-30 DIAGNOSIS — H35379 Puckering of macula, unspecified eye: Secondary | ICD-10-CM

## 2011-12-13 ENCOUNTER — Ambulatory Visit: Payer: Medicare PPO | Admitting: Internal Medicine

## 2011-12-21 ENCOUNTER — Encounter: Payer: Self-pay | Admitting: Internal Medicine

## 2011-12-21 ENCOUNTER — Ambulatory Visit (INDEPENDENT_AMBULATORY_CARE_PROVIDER_SITE_OTHER): Payer: Medicare PPO | Admitting: Internal Medicine

## 2011-12-21 VITALS — BP 110/76 | HR 83 | Ht 72.0 in | Wt 284.0 lb

## 2011-12-21 DIAGNOSIS — Z95810 Presence of automatic (implantable) cardiac defibrillator: Secondary | ICD-10-CM

## 2011-12-21 DIAGNOSIS — I4891 Unspecified atrial fibrillation: Secondary | ICD-10-CM

## 2011-12-21 DIAGNOSIS — I509 Heart failure, unspecified: Secondary | ICD-10-CM

## 2011-12-21 DIAGNOSIS — I428 Other cardiomyopathies: Secondary | ICD-10-CM

## 2011-12-21 LAB — ICD DEVICE OBSERVATION
BATTERY VOLTAGE: 2.57 V
BRDY-0002RV: 60 {beats}/min
DEV-0020ICD: NEGATIVE
DEVICE MODEL ICD: 202844
HV IMPEDENCE: 43 Ohm
RV LEAD AMPLITUDE: 9.3 mv
RV LEAD IMPEDENCE ICD: 530 Ohm
RV LEAD THRESHOLD: 0.6 V
VENTRICULAR PACING ICD: 7 pct

## 2011-12-21 NOTE — Patient Instructions (Signed)
Your physician recommends that you schedule a follow-up appointment in: 3 months with device clinic  

## 2011-12-22 ENCOUNTER — Encounter: Payer: Self-pay | Admitting: Internal Medicine

## 2011-12-22 NOTE — Progress Notes (Signed)
HPI [name] returns today for followup. He is a pleasant 71 yo man with a h/o chronic systolic CHF, non-ischemic CM, s/p ICD implant. He denies chest pain, sob, or peripheral edema. No ICD shocks. Allergies  Allergen Reactions  . Niaspan (Niacin (Antihyperlipidemic)) Other (See Comments)    flushes     Current Outpatient Prescriptions  Medication Sig Dispense Refill  . albuterol (PROVENTIL HFA;VENTOLIN HFA) 108 (90 BASE) MCG/ACT inhaler Inhale 2 puffs into the lungs every 4 (four) hours as needed for wheezing.  1 Inhaler  2  . carvedilol (COREG) 25 MG tablet Take 1 tablet (25 mg total) by mouth 2 (two) times daily with a meal.  180 tablet  3  . digoxin (LANOXIN) 0.25 MG tablet Take 1 tablet (250 mcg total) by mouth daily.  90 tablet  3  . gemfibrozil (LOPID) 600 MG tablet Take 1 tablet (600 mg total) by mouth 2 (two) times daily before a meal.  180 tablet  3  . metFORMIN (GLUCOPHAGE) 500 MG tablet Take 1 tablet (500 mg total) by mouth 2 (two) times daily with a meal.  180 tablet  3  . niacin (NIASPAN) 500 MG CR tablet Take 1 tablet (500 mg total) by mouth at bedtime.  90 tablet  3  . pravastatin (PRAVACHOL) 40 MG tablet Take 1 tablet (40 mg total) by mouth every evening.  90 tablet  3  . ramipril (ALTACE) 5 MG capsule Take 1 capsule (5 mg total) by mouth daily.  90 capsule  3  . warfarin (COUMADIN) 5 MG tablet Take 5 mg by mouth daily.          Past Medical History  Diagnosis Date  . Diabetes mellitus   . Heart disease   . Hyperlipidemia   . Cardiomyopathy, nonischemic 1998  . Atrial fibrillation   . OSA (obstructive sleep apnea)     ROS:   All systems reviewed and negative except as noted in the HPI.   Past Surgical History  Procedure Date  . Cardiac defibrillator placement 2006     Family History  Problem Relation Age of Onset  . Heart disease Mother   . Sudden death Mother     under age 40  . Lupus Mother   . Hypertension Father   . Stroke Father       History   Social History  . Marital Status: Married    Spouse Name: N/A    Number of Children: N/A  . Years of Education: N/A   Occupational History  . Not on file.   Social History Main Topics  . Smoking status: Never Smoker   . Smokeless tobacco: Not on file  . Alcohol Use: No  . Drug Use: Not on file  . Sexually Active: Not on file   Other Topics Concern  . Not on file   Social History Narrative  . No narrative on file     BP 110/76  Pulse 83  Ht 6' (1.829 m)  Wt 128.822 kg (284 lb)  BMI 38.52 kg/m2  SpO2 96%  Physical Exam:  Well appearing 71 yo man, NAD HEENT: Unremarkable Neck:  No JVD, no thyromegally Lungs:  Clear with no wheezes, rales, or rhonchi HEART:  Regular rate rhythm, no murmurs, no rubs, no clicks Abd:  soft, positive bowel sounds, no organomegally, no rebound, no guarding Ext:  2 plus pulses, no edema, no cyanosis, no clubbing Skin:  No rashes no nodules Neuro:  CN II through XII  intact, motor grossly intact  DEVICE  Normal device function.  See PaceArt for details.   Assess/Plan:

## 2011-12-22 NOTE — Assessment & Plan Note (Signed)
His ventricular rates are well controlled. Will follow.

## 2011-12-22 NOTE — Assessment & Plan Note (Signed)
His symptoms remain class 2. He will continue his current meds and maintain a low sodium diet. 

## 2011-12-22 NOTE — Assessment & Plan Note (Signed)
His device is working normally. Will recheck in several months. 

## 2011-12-30 ENCOUNTER — Ambulatory Visit (INDEPENDENT_AMBULATORY_CARE_PROVIDER_SITE_OTHER): Payer: Medicare PPO | Admitting: Family Medicine

## 2011-12-30 DIAGNOSIS — I4891 Unspecified atrial fibrillation: Secondary | ICD-10-CM

## 2011-12-30 LAB — POCT INR: INR: 1.9

## 2011-12-30 NOTE — Patient Instructions (Signed)
  Latest dosing instructions   Total Sun Mon Tue Wed Thu Fri Sat   30 5 mg 2.5 mg 5 mg 2.5 mg 5 mg 5 mg 5 mg    (5 mg1) (5 mg0.5) (5 mg1) (5 mg0.5) (5 mg1) (5 mg1) (5 mg1)        

## 2012-01-17 ENCOUNTER — Ambulatory Visit (INDEPENDENT_AMBULATORY_CARE_PROVIDER_SITE_OTHER): Payer: Medicare PPO | Admitting: Internal Medicine

## 2012-01-17 ENCOUNTER — Encounter: Payer: Self-pay | Admitting: Internal Medicine

## 2012-01-17 VITALS — BP 123/88 | HR 81 | Ht 73.0 in | Wt 289.4 lb

## 2012-01-17 DIAGNOSIS — I428 Other cardiomyopathies: Secondary | ICD-10-CM

## 2012-01-17 DIAGNOSIS — E785 Hyperlipidemia, unspecified: Secondary | ICD-10-CM

## 2012-01-17 DIAGNOSIS — I4891 Unspecified atrial fibrillation: Secondary | ICD-10-CM

## 2012-01-17 MED ORDER — DIGOXIN 250 MCG PO TABS: 250.0000 ug | ORAL_TABLET | Freq: Every day | ORAL | Status: DC

## 2012-01-17 MED ORDER — GEMFIBROZIL 600 MG PO TABS: 600.0000 mg | ORAL_TABLET | Freq: Two times a day (BID) | ORAL | Status: DC

## 2012-01-17 MED ORDER — RAMIPRIL 5 MG PO CAPS: 5.0000 mg | ORAL_CAPSULE | Freq: Every day | ORAL | Status: DC

## 2012-01-17 MED ORDER — PRAVASTATIN SODIUM 40 MG PO TABS: 40.0000 mg | ORAL_TABLET | Freq: Every evening | ORAL | Status: DC

## 2012-01-17 NOTE — Patient Instructions (Signed)
Your physician wants you to follow-up in:  November 2013 You will receive a reminder letter in the mail two months in advance. If you don't receive a letter, please call our office to schedule the follow-up appointment.  

## 2012-01-17 NOTE — Progress Notes (Signed)
HPI Patient is a 71 year old with NICM, s/p ICD.  Also a history of afib, DM, HL and sleep apnea  I saw him once in April 2012 (Patient moved her from [place]) Echo after showed LVEF 50 to 55% Last seen in cardiology  by [name] in April 2013 for ICD check Since I saw him he has done well from a cardiac standpoint.  No edema.  No CP.  Breathing is good.   He did have several URIs this winter which he attribues to being exposed to grandchildren.  One required admit for dehydration.    Allergies  Allergen Reactions  . Niaspan (Niacin Er (Antihyperlipidemic)) Other (See Comments)    flushes    Current Outpatient Prescriptions  Medication Sig Dispense Refill  . carvedilol (COREG) 25 MG tablet Take 1 tablet (25 mg total) by mouth 2 (two) times daily with a meal.  180 tablet  3  . digoxin (LANOXIN) 0.25 MG tablet Take 1 tablet (250 mcg total) by mouth daily.  90 tablet  3  . gemfibrozil (LOPID) 600 MG tablet Take 1 tablet (600 mg total) by mouth 2 (two) times daily before a meal.  180 tablet  3  . metFORMIN (GLUCOPHAGE) 500 MG tablet Take 1 tablet (500 mg total) by mouth 2 (two) times daily with a meal.  180 tablet  3  . ramipril (ALTACE) 5 MG capsule Take 1 capsule (5 mg total) by mouth daily.  90 capsule  3  . warfarin (COUMADIN) 5 MG tablet Take 5 mg by mouth daily.       Marland Kitchen albuterol (PROVENTIL HFA;VENTOLIN HFA) 108 (90 BASE) MCG/ACT inhaler Inhale 2 puffs into the lungs every 4 (four) hours as needed for wheezing.  1 Inhaler  2  . pravastatin (PRAVACHOL) 40 MG tablet Take 1 tablet (40 mg total) by mouth every evening.  90 tablet  3    Past Medical History  Diagnosis Date  . Diabetes mellitus   . Heart disease   . Hyperlipidemia   . Cardiomyopathy, nonischemic 1998  . Atrial fibrillation   . OSA (obstructive sleep apnea)     Past Surgical History  Procedure Date  . Cardiac defibrillator placement 2006    Family History  Problem Relation Age of Onset  . Heart disease Mother   .  Sudden death Mother     under age 42  . Lupus Mother   . Hypertension Father   . Stroke Father     History   Social History  . Marital Status: Married    Spouse Name: N/A    Number of Children: N/A  . Years of Education: N/A   Occupational History  . Not on file.   Social History Main Topics  . Smoking status: Never Smoker   . Smokeless tobacco: Not on file  . Alcohol Use: No  . Drug Use: Not on file  . Sexually Active: Not on file   Other Topics Concern  . Not on file   Social History Narrative  . No narrative on file    Review of Systems:  All systems reviewed.  They are negative to the above problem except as previously stated.  Vital Signs: BP 123/88  Pulse 81  Ht 6\' 1"  (1.854 m)  Wt 289 lb 6.4 oz (131.271 kg)  BMI 38.18 kg/m2  Physical Exam Patient is in NAD HEENT:  Normocephalic, atraumatic. EOMI, PERRLA.  Neck: JVP is normal. No thyromegaly. No bruits.  Lungs: clear to  auscultation. No rales no wheezes.  Heart: Regular rate and rhythm. Normal S1, S2. No S3.   No significant murmurs. PMI not displaced.  Abdomen:  Supple, nontender. Normal bowel sounds. No masses. No hepatomegaly.  Extremities:   Good distal pulses throughout. No lower extremity edema.  Musculoskeletal :moving all extremities.  Neuro:   alert and oriented x3.  CN II-XII grossly intact.  EKG:  Afib with occasional paced beat  81 bpm.  IWMI Assessment and Plan:  1.  NICM.  LVEF WNL on echo.  Volume status looks god.  I would keep on same regimen. Continue f/u with [name]. 2.  Afib. Keep on current regmen.  COntinue coumadin. Will check DIg level and BMET 3.  Dyslipidemia  Will check lipids on pravasatin. Encouraged him to stay acitve F/U in falll  Alternate f/u with [name].

## 2012-02-01 ENCOUNTER — Ambulatory Visit: Payer: Medicare PPO

## 2012-02-01 ENCOUNTER — Ambulatory Visit (INDEPENDENT_AMBULATORY_CARE_PROVIDER_SITE_OTHER): Payer: Medicare PPO | Admitting: Family

## 2012-02-01 DIAGNOSIS — I4891 Unspecified atrial fibrillation: Secondary | ICD-10-CM

## 2012-02-01 LAB — POCT INR: INR: 1.9

## 2012-02-01 NOTE — Patient Instructions (Addendum)
  Latest dosing instructions   Total Glynis Smiles Tue Wed Thu Fri Sat   30 5 mg 2.5 mg 5 mg 2.5 mg 5 mg 5 mg 5 mg    (5 mg1) (5 mg0.5) (5 mg1) (5 mg0.5) (5 mg1) (5 mg1) (5 mg1)      Take 5 mg tomorrow, Wednesday. Then same dose - 2.5 mg Monday and Wednesday and 5 mg every other day. Recheck in 3 weeks.

## 2012-02-18 ENCOUNTER — Other Ambulatory Visit: Payer: Self-pay | Admitting: Family Medicine

## 2012-02-22 ENCOUNTER — Other Ambulatory Visit (INDEPENDENT_AMBULATORY_CARE_PROVIDER_SITE_OTHER): Payer: Medicare PPO

## 2012-02-22 DIAGNOSIS — E782 Mixed hyperlipidemia: Secondary | ICD-10-CM

## 2012-02-22 LAB — BASIC METABOLIC PANEL
BUN: 19 mg/dL (ref 6–23)
CO2: 26 mEq/L (ref 19–32)
Calcium: 8.9 mg/dL (ref 8.4–10.5)
Chloride: 105 mEq/L (ref 96–112)
Creatinine, Ser: 0.9 mg/dL (ref 0.4–1.5)
GFR: 94.43 mL/min (ref >60.00)
Glucose, Bld: 105 mg/dL — ABNORMAL HIGH (ref 70–99)
Potassium: 4.3 mEq/L (ref 3.5–5.1)
Sodium: 141 mEq/L (ref 135–145)

## 2012-02-22 LAB — LIPID PANEL
Cholesterol: 170 mg/dL (ref 0–200)
HDL: 44.5 mg/dL (ref >39.00)
LDL Cholesterol: 91 mg/dL (ref 0–99)
Total CHOL/HDL Ratio: 4
Triglycerides: 171 mg/dL — ABNORMAL HIGH (ref 0.0–149.0)
VLDL: 34.2 mg/dL (ref 0.0–40.0)

## 2012-02-22 LAB — POCT INR: INR: 1.8

## 2012-02-22 NOTE — Patient Instructions (Signed)
5mg  everyday except Wednesdays, 2.5mg . Recheck 2 weeks    Latest dosing instructions   Total Sun Mon Tue Wed Thu Fri Sat   32.5 5 mg 5 mg 5 mg 2.5 mg 5 mg 5 mg 5 mg    (5 mg1) (5 mg1) (5 mg1) (5 mg0.5) (5 mg1) (5 mg1) (5 mg1)

## 2012-02-22 NOTE — Addendum Note (Signed)
Addended by: Rita Ohara R on: 02/22/2012 12:59 PM   Modules accepted: Orders

## 2012-02-22 NOTE — Patient Instructions (Addendum)
  Latest dosing instructions   Total Sun Mon Tue Wed Thu Fri Sat   32.5 5 mg 5 mg 5 mg 2.5 mg 5 mg 5 mg 5 mg    (5 mg1) (5 mg1) (5 mg1) (5 mg0.5) (5 mg1) (5 mg1) (5 mg1)       5mg  everyday except Wednesdays, 2.5mg . Recheck 2 weeks

## 2012-02-23 LAB — DIGOXIN LEVEL: Digoxin Level: 0.6 ng/mL — ABNORMAL LOW (ref 0.8–2.0)

## 2012-02-23 NOTE — Progress Notes (Signed)
Quick Note:  Pt informed on VM ______ 

## 2012-03-06 ENCOUNTER — Other Ambulatory Visit: Payer: Self-pay | Admitting: Family Medicine

## 2012-03-07 ENCOUNTER — Ambulatory Visit (INDEPENDENT_AMBULATORY_CARE_PROVIDER_SITE_OTHER): Payer: Medicare PPO | Admitting: Family

## 2012-03-07 DIAGNOSIS — I4891 Unspecified atrial fibrillation: Secondary | ICD-10-CM

## 2012-03-07 LAB — POCT INR: INR: 1.9

## 2012-03-07 NOTE — Patient Instructions (Signed)
5mg  everyday. Recheck 3 weeks.    Latest dosing instructions   Total Sun Mon Tue Wed Thu Fri Sat   35 5 mg 5 mg 5 mg 5 mg 5 mg 5 mg 5 mg    (5 mg1) (5 mg1) (5 mg1) (5 mg1) (5 mg1) (5 mg1) (5 mg1)

## 2012-03-20 ENCOUNTER — Ambulatory Visit (INDEPENDENT_AMBULATORY_CARE_PROVIDER_SITE_OTHER): Payer: Medicare PPO | Admitting: *Deleted

## 2012-03-20 ENCOUNTER — Encounter: Payer: Self-pay | Admitting: Internal Medicine

## 2012-03-20 DIAGNOSIS — I509 Heart failure, unspecified: Secondary | ICD-10-CM

## 2012-03-20 DIAGNOSIS — I428 Other cardiomyopathies: Secondary | ICD-10-CM

## 2012-03-20 LAB — ICD DEVICE OBSERVATION
BATTERY VOLTAGE: 2.57 V
BRDY-0002RV: 60 {beats}/min
DEV-0020ICD: NEGATIVE
DEVICE MODEL ICD: 202844
HV IMPEDENCE: 41 Ohm
RV LEAD AMPLITUDE: 8.8 mv
RV LEAD IMPEDENCE ICD: 500 Ohm
RV LEAD THRESHOLD: 0.8 V
VENTRICULAR PACING ICD: 5 pct

## 2012-03-20 NOTE — Progress Notes (Signed)
ICD check 

## 2012-03-28 ENCOUNTER — Ambulatory Visit (INDEPENDENT_AMBULATORY_CARE_PROVIDER_SITE_OTHER): Payer: Medicare PPO | Admitting: Family

## 2012-03-28 DIAGNOSIS — I4891 Unspecified atrial fibrillation: Secondary | ICD-10-CM

## 2012-03-28 LAB — POCT INR: INR: 2.3

## 2012-03-28 MED ORDER — WARFARIN SODIUM 5 MG PO TABS: 5.0000 mg | ORAL_TABLET | Freq: Every day | ORAL | Status: DC

## 2012-03-28 NOTE — Patient Instructions (Addendum)
  Latest dosing instructions   Total Sun Mon Tue Wed Thu Fri Sat   35 5 mg 5 mg 5 mg 5 mg 5 mg 5 mg 5 mg    (5 mg1) (5 mg1) (5 mg1) (5 mg1) (5 mg1) (5 mg1) (5 mg1)        5mg  (1tab) DAILY. RECHECK IN 4 WEEKS

## 2012-04-12 ENCOUNTER — Ambulatory Visit (INDEPENDENT_AMBULATORY_CARE_PROVIDER_SITE_OTHER): Payer: Medicare PPO | Admitting: Family Medicine

## 2012-04-12 ENCOUNTER — Encounter: Payer: Self-pay | Admitting: Family Medicine

## 2012-04-12 VITALS — BP 108/72 | Temp 98.2°F | Wt 298.0 lb

## 2012-04-12 DIAGNOSIS — E785 Hyperlipidemia, unspecified: Secondary | ICD-10-CM

## 2012-04-12 DIAGNOSIS — E119 Type 2 diabetes mellitus without complications: Secondary | ICD-10-CM

## 2012-04-12 DIAGNOSIS — R600 Localized edema: Secondary | ICD-10-CM

## 2012-04-12 DIAGNOSIS — R609 Edema, unspecified: Secondary | ICD-10-CM

## 2012-04-12 DIAGNOSIS — N179 Acute kidney failure, unspecified: Secondary | ICD-10-CM

## 2012-04-12 LAB — HEMOGLOBIN A1C: Hgb A1c MFr Bld: 6.7 % — ABNORMAL HIGH (ref 4.6–6.5)

## 2012-04-12 MED ORDER — FUROSEMIDE 20 MG PO TABS: ORAL_TABLET | ORAL | Status: DC

## 2012-04-12 NOTE — Patient Instructions (Signed)
Furosemide 20 mg once daily for 3 days as needed for edema

## 2012-04-12 NOTE — Progress Notes (Signed)
Subjective:    Patient ID: Collin [name], male    DOB: Aug 29, 1941, 71 y.o.   MRN: [id number]  HPI  Medical followup. Patient has history of obesity, OSA, nonischemic cardiomyopathy, type 2 diabetes, hyperlipidemia, ICD implant, and history of atrial fibrillation . Not checking blood sugars. Last A1c 6.7%. No symptoms of hyperglycemia.  He's had some bilateral leg and foot edema. Poor compliance with diet times. Increased sodium intake. Edema worse late in the day. No increase in dyspnea. No orthopnea.  Recent renal profile and lipids were done and stable. Coumadin followed in the Coumadin clinic and stable. No recent bleeding complications.  Past Medical History  Diagnosis Date  . Diabetes mellitus   . Heart disease   . Hyperlipidemia   . Cardiomyopathy, nonischemic 1998  . Atrial fibrillation   . OSA (obstructive sleep apnea)    Past Surgical History  Procedure Date  . Cardiac defibrillator placement 2006    reports that he has never smoked. He does not have any smokeless tobacco history on file. He reports that he does not drink alcohol. His drug history not on file. family history includes Heart disease in his mother; Hypertension in his father; Lupus in his mother; Stroke in his father; and Sudden death in his mother. Allergies  Allergen Reactions  . Niaspan (Niacin Er) Other (See Comments)    flushes      Review of Systems  Constitutional: Negative for fatigue.  Eyes: Negative for visual disturbance.  Respiratory: Negative for cough, chest tightness and shortness of breath.   Cardiovascular: Positive for leg swelling. Negative for chest pain and palpitations.  Neurological: Negative for dizziness, syncope, weakness, light-headedness and headaches.       Objective:   Physical Exam  Constitutional: He appears well-developed and well-nourished. No distress.  Neck: Neck supple. No thyromegaly present.  Cardiovascular: Normal rate.   Pulmonary/Chest: Effort normal  and breath sounds normal. No respiratory distress. He has no wheezes. He has no rales.  Musculoskeletal: He exhibits edema.       Patient has 1+ edema feet ankles lower legs bilaterally.  Lymphadenopathy:    He has no cervical adenopathy.          Assessment & Plan:  #1 peripheral edema. Likely venous stasis. Decrease sodium. Frequent elevation. Short-term use only Lasix 20 mg daily for 3 days. #2 hypertension stable continue current medication #3 type 2 diabetes. Recheck hemoglobin A1c. Routine followup 6 months  #4 history of nonischemic cardiomyopathy symptomatically stable

## 2012-04-20 ENCOUNTER — Other Ambulatory Visit: Payer: Self-pay | Admitting: Family Medicine

## 2012-04-20 ENCOUNTER — Ambulatory Visit (INDEPENDENT_AMBULATORY_CARE_PROVIDER_SITE_OTHER): Payer: Medicare PPO | Admitting: Family

## 2012-04-20 DIAGNOSIS — I4891 Unspecified atrial fibrillation: Secondary | ICD-10-CM

## 2012-04-20 LAB — POCT INR: INR: 2.2

## 2012-04-20 NOTE — Patient Instructions (Signed)
Continue 5mg  daily. Recheck in 4 weeks.    Latest dosing instructions   Total Sun Mon Tue Wed Thu Fri Sat   35 5 mg 5 mg 5 mg 5 mg 5 mg 5 mg 5 mg    (5 mg1) (5 mg1) (5 mg1) (5 mg1) (5 mg1) (5 mg1) (5 mg1)

## 2012-04-26 ENCOUNTER — Encounter: Payer: Self-pay | Admitting: Internal Medicine

## 2012-05-12 ENCOUNTER — Ambulatory Visit (INDEPENDENT_AMBULATORY_CARE_PROVIDER_SITE_OTHER): Payer: Medicare PPO | Admitting: Family

## 2012-05-12 DIAGNOSIS — I4891 Unspecified atrial fibrillation: Secondary | ICD-10-CM

## 2012-05-12 LAB — POCT INR: INR: 2.3

## 2012-05-12 NOTE — Patient Instructions (Signed)
Continue 5mg daily. Recheck in 6 weeks.    Latest dosing instructions   Total Sun Mon Tue Wed Thu Fri Sat   35 5 mg 5 mg 5 mg 5 mg 5 mg 5 mg 5 mg    (5 mg1) (5 mg1) (5 mg1) (5 mg1) (5 mg1) (5 mg1) (5 mg1)        

## 2012-06-01 ENCOUNTER — Ambulatory Visit (INDEPENDENT_AMBULATORY_CARE_PROVIDER_SITE_OTHER): Payer: Medicare PPO | Admitting: Ophthalmology

## 2012-06-01 DIAGNOSIS — H35379 Puckering of macula, unspecified eye: Secondary | ICD-10-CM

## 2012-06-01 DIAGNOSIS — H35039 Hypertensive retinopathy, unspecified eye: Secondary | ICD-10-CM

## 2012-06-01 DIAGNOSIS — E1139 Type 2 diabetes mellitus with other diabetic ophthalmic complication: Secondary | ICD-10-CM

## 2012-06-01 DIAGNOSIS — E11319 Type 2 diabetes mellitus with unspecified diabetic retinopathy without macular edema: Secondary | ICD-10-CM

## 2012-06-01 DIAGNOSIS — E1165 Type 2 diabetes mellitus with hyperglycemia: Secondary | ICD-10-CM

## 2012-06-01 DIAGNOSIS — H43819 Vitreous degeneration, unspecified eye: Secondary | ICD-10-CM

## 2012-06-01 DIAGNOSIS — I10 Essential (primary) hypertension: Secondary | ICD-10-CM

## 2012-06-13 ENCOUNTER — Encounter: Payer: Self-pay | Admitting: *Deleted

## 2012-06-22 ENCOUNTER — Ambulatory Visit (INDEPENDENT_AMBULATORY_CARE_PROVIDER_SITE_OTHER): Payer: Medicare PPO | Admitting: *Deleted

## 2012-06-22 ENCOUNTER — Encounter: Payer: Self-pay | Admitting: Internal Medicine

## 2012-06-22 DIAGNOSIS — I509 Heart failure, unspecified: Secondary | ICD-10-CM

## 2012-06-22 DIAGNOSIS — I428 Other cardiomyopathies: Secondary | ICD-10-CM

## 2012-06-22 LAB — ICD DEVICE OBSERVATION
BATTERY VOLTAGE: 2.57 V
BRDY-0002RV: 60 {beats}/min
CHARGE TIME: 13.1 s
DEV-0020ICD: NEGATIVE
DEVICE MODEL ICD: 202844
HV IMPEDENCE: 41 Ohm
RV LEAD AMPLITUDE: 10.1 mv
RV LEAD IMPEDENCE ICD: 524 Ohm
RV LEAD THRESHOLD: 0.7 V
VENTRICULAR PACING ICD: 0 pct

## 2012-06-22 NOTE — Progress Notes (Signed)
ICD check 

## 2012-06-22 NOTE — Patient Instructions (Addendum)
Return office visit 09/11/12 @ 11:30am.

## 2012-06-23 ENCOUNTER — Ambulatory Visit (INDEPENDENT_AMBULATORY_CARE_PROVIDER_SITE_OTHER): Payer: Medicare PPO | Admitting: Family

## 2012-06-23 DIAGNOSIS — I4891 Unspecified atrial fibrillation: Secondary | ICD-10-CM

## 2012-06-23 LAB — POCT INR: INR: 2.3

## 2012-06-23 NOTE — Patient Instructions (Addendum)
Continue 5mg  daily. Recheck in 6 weeks.    Latest dosing instructions   Total Sun Mon Tue Wed Thu Fri Sat   35 5 mg 5 mg 5 mg 5 mg 5 mg 5 mg 5 mg    (5 mg1) (5 mg1) (5 mg1) (5 mg1) (5 mg1) (5 mg1) (5 mg1)

## 2012-07-03 ENCOUNTER — Telehealth: Payer: Self-pay | Admitting: Family Medicine

## 2012-07-03 MED ORDER — WARFARIN SODIUM 5 MG PO TABS: 5.0000 mg | ORAL_TABLET | Freq: Every day | ORAL | Status: DC

## 2012-07-03 NOTE — Telephone Encounter (Signed)
Pt needs 90 day rx refill of warfarin .5mg  sent to [facility] order pharmacy

## 2012-08-07 ENCOUNTER — Ambulatory Visit (INDEPENDENT_AMBULATORY_CARE_PROVIDER_SITE_OTHER): Payer: Medicare PPO | Admitting: Family

## 2012-08-07 DIAGNOSIS — I4891 Unspecified atrial fibrillation: Secondary | ICD-10-CM

## 2012-08-07 LAB — POCT INR: INR: 2.6

## 2012-08-07 NOTE — Patient Instructions (Addendum)
Continue 5mg daily. Recheck in 6 weeks.    Latest dosing instructions   Total Sun Mon Tue Wed Thu Fri Sat   35 5 mg 5 mg 5 mg 5 mg 5 mg 5 mg 5 mg    (5 mg1) (5 mg1) (5 mg1) (5 mg1) (5 mg1) (5 mg1) (5 mg1)        

## 2012-09-15 ENCOUNTER — Encounter: Payer: Self-pay | Admitting: Cardiology

## 2012-09-15 ENCOUNTER — Ambulatory Visit (INDEPENDENT_AMBULATORY_CARE_PROVIDER_SITE_OTHER): Payer: Medicare PPO | Admitting: Cardiology

## 2012-09-15 DIAGNOSIS — I428 Other cardiomyopathies: Secondary | ICD-10-CM

## 2012-09-15 DIAGNOSIS — Z95810 Presence of automatic (implantable) cardiac defibrillator: Secondary | ICD-10-CM

## 2012-09-15 LAB — ICD DEVICE OBSERVATION
BRDY-0002RV: 60 {beats}/min
DEV-0020ICD: NEGATIVE
DEVICE MODEL ICD: 202844
FVT: 0
HV IMPEDENCE: 40 Ohm
PACEART VT: 0
RV LEAD AMPLITUDE: 10.9 mv
RV LEAD IMPEDENCE ICD: 506 Ohm
RV LEAD THRESHOLD: 0.8 V
VENTRICULAR PACING ICD: 8 pct
VF: 0

## 2012-09-15 NOTE — Patient Instructions (Addendum)
Your physician wants you to follow-up in: April with Dr [name]. You will receive a reminder letter in the mail two months in advance. If you don't receive a letter, please call our office to schedule the follow-up appointment.

## 2012-09-17 ENCOUNTER — Other Ambulatory Visit: Payer: Self-pay | Admitting: Family

## 2012-09-18 ENCOUNTER — Ambulatory Visit (INDEPENDENT_AMBULATORY_CARE_PROVIDER_SITE_OTHER): Payer: Medicare PPO | Admitting: Family

## 2012-09-18 DIAGNOSIS — I4891 Unspecified atrial fibrillation: Secondary | ICD-10-CM

## 2012-09-18 LAB — POCT INR: INR: 1.9

## 2012-09-18 NOTE — Patient Instructions (Addendum)
Take an extra 1/2 tab today only. Continue 5mg  daily. Recheck in 6 weeks.    Latest dosing instructions   Total Sun Mon Tue Wed Thu Fri Sat   35 5 mg 5 mg 5 mg 5 mg 5 mg 5 mg 5 mg    (5 mg1) (5 mg1) (5 mg1) (5 mg1) (5 mg1) (5 mg1) (5 mg1)

## 2012-09-19 ENCOUNTER — Encounter: Payer: Self-pay | Admitting: Cardiology

## 2012-09-19 NOTE — Progress Notes (Signed)
ICD check/device clinic only. See PaceArt report.  

## 2012-09-25 ENCOUNTER — Ambulatory Visit (INDEPENDENT_AMBULATORY_CARE_PROVIDER_SITE_OTHER): Payer: Medicare PPO | Admitting: Internal Medicine

## 2012-09-25 ENCOUNTER — Encounter: Payer: Self-pay | Admitting: Internal Medicine

## 2012-09-25 VITALS — BP 106/72 | HR 76 | Ht 73.0 in | Wt 294.0 lb

## 2012-09-25 DIAGNOSIS — R0602 Shortness of breath: Secondary | ICD-10-CM

## 2012-09-25 DIAGNOSIS — I4891 Unspecified atrial fibrillation: Secondary | ICD-10-CM

## 2012-09-25 LAB — BRAIN NATRIURETIC PEPTIDE: Pro B Natriuretic peptide (BNP): 107 pg/mL — ABNORMAL HIGH (ref 0.0–100.0)

## 2012-09-25 LAB — CBC WITH DIFFERENTIAL/PLATELET
Basophils Absolute: 0 10*3/uL (ref 0.0–0.1)
Basophils Relative: 0.2 % (ref 0.0–3.0)
Eosinophils Absolute: 0.1 10*3/uL (ref 0.0–0.7)
Eosinophils Relative: 1.9 % (ref 0.0–5.0)
HCT: 43.7 % (ref 39.0–52.0)
Hemoglobin: 14.5 g/dL (ref 13.0–17.0)
Lymphocytes Relative: 16.5 % (ref 12.0–46.0)
Lymphs Abs: 0.7 10*3/uL (ref 0.7–4.0)
MCHC: 33.3 g/dL (ref 30.0–36.0)
MCV: 90.1 fl (ref 78.0–100.0)
Monocytes Absolute: 0.5 10*3/uL (ref 0.1–1.0)
Monocytes Relative: 10.2 % (ref 3.0–12.0)
Neutro Abs: 3.1 10*3/uL (ref 1.4–7.7)
Neutrophils Relative %: 71.2 % (ref 43.0–77.0)
Platelets: 199 10*3/uL (ref 150.0–400.0)
RBC: 4.84 Mil/uL (ref 4.22–5.81)
RDW: 14.4 % (ref 11.5–14.6)
WBC: 4.4 10*3/uL — ABNORMAL LOW (ref 4.5–10.5)

## 2012-09-25 LAB — BASIC METABOLIC PANEL
BUN: 21 mg/dL (ref 6–23)
CO2: 27 mEq/L (ref 19–32)
Calcium: 9.1 mg/dL (ref 8.4–10.5)
Chloride: 103 mEq/L (ref 96–112)
Creatinine, Ser: 0.9 mg/dL (ref 0.4–1.5)
GFR: 89.4 mL/min (ref >60.00)
Glucose, Bld: 107 mg/dL — ABNORMAL HIGH (ref 70–99)
Potassium: 4.2 mEq/L (ref 3.5–5.1)
Sodium: 139 mEq/L (ref 135–145)

## 2012-09-25 NOTE — Progress Notes (Signed)
HPI Patient is a 72 year old with NICM, s/p ICD. Also a history of afib, DM, HL and sleep apnea I saw him once in April 2012 (Patient moved her from [place]) Echo after showed LVEF 50 to 55% Last seen in cardiology by [name] in April 2013 for ICD check Lipid panel in June LDL was 91, HDL 44, Trig 171 SInce I saw him he notes some increased SOB  His weight is up from eating more.  Denies CP  No signif edema  Allergies  Allergen Reactions  . Niaspan (Niacin Er) Other (See Comments)    flushes    Current Outpatient Prescriptions  Medication Sig Dispense Refill  . carvedilol (COREG) 25 MG tablet TAKE 1 TABLET TWICE DAILY WITH MEALS  180 tablet  PRN  . digoxin (LANOXIN) 0.25 MG tablet Take 1 tablet (250 mcg total) by mouth daily.  90 tablet  3  . furosemide (LASIX) 20 MG tablet One daily as needed for leg edema  30 tablet  3  . gemfibrozil (LOPID) 600 MG tablet TAKE 1 TABLET TWICE DAILY BEFORE A MEAL  180 tablet  PRN  . metFORMIN (GLUCOPHAGE) 500 MG tablet TAKE 1 TABLET TWICE DAILY WITH MEALS  180 tablet  PRN  . niacin 500 MG tablet Take 500 mg by mouth daily.      . pravastatin (PRAVACHOL) 40 MG tablet Take 40 mg by mouth every evening.      . ramipril (ALTACE) 5 MG capsule TAKE 1 CAPSULE DAILY  90 capsule  PRN  . warfarin (COUMADIN) 5 MG tablet TAKE 1 TABLET EVERY DAY  90 tablet  PRN    Past Medical History  Diagnosis Date  . Diabetes mellitus   . Heart disease   . Hyperlipidemia   . Cardiomyopathy, nonischemic 1998  . Atrial fibrillation   . OSA (obstructive sleep apnea)     Past Surgical History  Procedure Date  . Cardiac defibrillator placement 2006    Family History  Problem Relation Age of Onset  . Heart disease Mother   . Sudden death Mother     under age 67  . Lupus Mother   . Hypertension Father   . Stroke Father     History   Social History  . Marital Status: Married    Spouse Name: N/A    Number of Children: N/A  . Years of Education: N/A   Occupational  History  . Not on file.   Social History Main Topics  . Smoking status: Never Smoker   . Smokeless tobacco: Not on file  . Alcohol Use: No  . Drug Use: Not on file  . Sexually Active: Not on file   Other Topics Concern  . Not on file   Social History Narrative  . No narrative on file    Review of Systems:  All systems reviewed.  They are negative to the above problem except as previously stated.  Vital Signs: BP 106/72  Pulse 76  Ht 6\' 1"  (1.854 m)  Wt 294 lb (133.358 kg)  BMI 38.79 kg/m2  Physical Exam Patient is in NAD HEENT:  Normocephalic, atraumatic. EOMI, PERRLA.  Neck: JVP is normal.  No bruits.  Lungs: clear to auscultation. No rales no wheezes.  Heart: Regular rate and rhythm. Normal S1, S2. No S3.   No significant murmurs. PMI not displaced.  Abdomen:  Supple, nontender. Normal bowel sounds. No masses. No hepatomegaly.  Extremities:   Good distal pulses throughout. No lower extremity  edema.  Musculoskeletal :moving all extremities.  Neuro:   alert and oriented x3.  CN II-XII grossly intact.  EKG  Atrial fibrillation 75 bpm.  Occasional paced beat. IWMI Assessment and Plan: 1.  CM  Volume status looks good.  I am not convinced that his dyspnea is related to worsening heart failure  May be related to wt gain I encouraged him to stay on same medical regimen.  Cut back on diet portions.  Stay active.  If symptoms do not respond or if they worsen he should call back.  2.  Atrial fibrillation  Continue rate control and anticoag  3.  HL  Continue on statin. LDL in 02/2012 was 91; HDL was 45.

## 2012-09-25 NOTE — Patient Instructions (Signed)
Lab work today Your physician wants you to follow-up in: NOV 2014 You will receive a reminder letter in the mail two months in advance. If you don't receive a letter, please call our office to schedule the follow-up appointment.

## 2012-09-27 ENCOUNTER — Encounter: Payer: Self-pay | Admitting: Internal Medicine

## 2012-10-13 ENCOUNTER — Ambulatory Visit: Payer: Medicare PPO | Admitting: Family Medicine

## 2012-10-19 ENCOUNTER — Ambulatory Visit: Payer: Medicare PPO | Admitting: Family Medicine

## 2012-10-26 ENCOUNTER — Ambulatory Visit (INDEPENDENT_AMBULATORY_CARE_PROVIDER_SITE_OTHER): Payer: Medicare PPO | Admitting: Family Medicine

## 2012-10-26 ENCOUNTER — Encounter: Payer: Self-pay | Admitting: Family Medicine

## 2012-10-26 VITALS — BP 104/72 | Temp 98.4°F | Wt 289.0 lb

## 2012-10-26 DIAGNOSIS — E119 Type 2 diabetes mellitus without complications: Secondary | ICD-10-CM

## 2012-10-26 DIAGNOSIS — I4891 Unspecified atrial fibrillation: Secondary | ICD-10-CM

## 2012-10-26 DIAGNOSIS — E785 Hyperlipidemia, unspecified: Secondary | ICD-10-CM

## 2012-10-26 DIAGNOSIS — G4733 Obstructive sleep apnea (adult) (pediatric): Secondary | ICD-10-CM

## 2012-10-26 NOTE — Progress Notes (Signed)
Subjective:    Patient ID: Collin [name], male    DOB: 31-Mar-1941, 72 y.o.   MRN: [id number]  HPI  Medical followup. Patient has history of obesity, obstructive sleep apnea, atrial fibrillation, nonischemic cardiomyopathy, type 2 diabetes, dyslipidemia. Recently saw cardiologist. Basic metabolic panel unremarkable. Not monitoring blood sugars. No hypoglycemia. Most recent A1c 6.7%. Remains on Coumadin for atrial fibrillation. No bleeding complications. Only rare lightheadedness when bending over. No chest pain.  Patient gets yearly eye exams.  Obstructive sleep apnea not treated with CPAP. Patient states he is very claustrophobic. He has had great difficulties in the past even with testing. He does not wish to consult with anyone regarding other options at this time. He is aware that weight loss may help his obstructive apnea  Past Medical History  Diagnosis Date  . Diabetes mellitus   . Heart disease   . Hyperlipidemia   . Cardiomyopathy, nonischemic 1998  . Atrial fibrillation   . OSA (obstructive sleep apnea)    Past Surgical History  Procedure Laterality Date  . Cardiac defibrillator placement  2006    reports that he has never smoked. He does not have any smokeless tobacco history on file. He reports that he does not drink alcohol. His drug history is not on file. family history includes Heart disease in his mother; Hypertension in his father; Lupus in his mother; Stroke in his father; and Sudden death in his mother. Allergies  Allergen Reactions  . Niaspan (Niacin Er) Other (See Comments)    flushes      Review of Systems  Constitutional: Positive for fatigue. Negative for appetite change and unexpected weight change.  Respiratory: Negative for cough and shortness of breath.   Cardiovascular: Positive for leg swelling (chronic and unchanged). Negative for chest pain and palpitations.  Gastrointestinal: Negative for abdominal pain.       Objective:   Physical Exam   Constitutional: He appears well-developed and well-nourished.  HENT:  Mouth/Throat: Oropharynx is clear and moist.  Neck: Neck supple.  Cardiovascular: Normal rate.   Irregular rhythm  Pulmonary/Chest: Effort normal and breath sounds normal. No respiratory distress. He has no wheezes. He has no rales.  Musculoskeletal: He exhibits edema.  Trace to 1+ leg edema bilaterally. Feet reveal no lesions. He has small callus left foot metatarsophalangeal joint  Neurological: He is alert.          Assessment & Plan:  #1 type 2 diabetes. History of good control. Add A1c to labs for next week and when scheduled for INR #2 obstructive sleep apnea. Work on weight loss. Intolerant to CPAP in the past  #3 hypertension stable  #4 atrial fibrillation rate controlled. Continue Coumadin.

## 2012-10-30 ENCOUNTER — Ambulatory Visit (INDEPENDENT_AMBULATORY_CARE_PROVIDER_SITE_OTHER): Payer: Medicare PPO | Admitting: Family

## 2012-10-30 DIAGNOSIS — E119 Type 2 diabetes mellitus without complications: Secondary | ICD-10-CM

## 2012-10-30 DIAGNOSIS — I4891 Unspecified atrial fibrillation: Secondary | ICD-10-CM

## 2012-10-30 LAB — POCT INR: INR: 1.8

## 2012-10-30 LAB — HEMOGLOBIN A1C: Hgb A1c MFr Bld: 6.6 % — ABNORMAL HIGH (ref 4.6–6.5)

## 2012-10-30 NOTE — Patient Instructions (Addendum)
Take 7.5mg  on mondays. Continue 5mg  daily. Recheck in 3 weeks.  Anticoagulation Dose Instructions as of 10/30/2012     Glynis Smiles Tue Wed Thu Fri Sat   New Dose 5 mg 7.5 mg 5 mg 5 mg 5 mg 5 mg 5 mg    Description       Continue 5mg  daily. Recheck in 3 weeks.

## 2012-10-31 NOTE — Progress Notes (Signed)
Quick Note:  Pt informed on home VM ______ 

## 2012-11-20 ENCOUNTER — Ambulatory Visit (INDEPENDENT_AMBULATORY_CARE_PROVIDER_SITE_OTHER): Payer: Medicare PPO | Admitting: Family

## 2012-11-20 DIAGNOSIS — I4891 Unspecified atrial fibrillation: Secondary | ICD-10-CM

## 2012-11-20 LAB — POCT INR: INR: 2.2

## 2012-11-20 NOTE — Patient Instructions (Addendum)
7.5mg  every monday. All other days Continue 5mg  daily. Recheck in 4 weeks.  Anticoagulation Dose Instructions as of 11/20/2012     Collin [name] Tue Wed Thu Fri Sat   New Dose 5 mg 7.5 mg 5 mg 5 mg 5 mg 5 mg 5 mg    Description       7.5mg  every monday. All other days Continue 5mg  daily. Recheck in 4 weeks.

## 2012-11-29 ENCOUNTER — Ambulatory Visit (INDEPENDENT_AMBULATORY_CARE_PROVIDER_SITE_OTHER): Payer: Medicare HMO | Admitting: Ophthalmology

## 2012-11-29 DIAGNOSIS — H2510 Age-related nuclear cataract, unspecified eye: Secondary | ICD-10-CM

## 2012-11-29 DIAGNOSIS — I10 Essential (primary) hypertension: Secondary | ICD-10-CM

## 2012-11-29 DIAGNOSIS — H43819 Vitreous degeneration, unspecified eye: Secondary | ICD-10-CM

## 2012-11-29 DIAGNOSIS — E11359 Type 2 diabetes mellitus with proliferative diabetic retinopathy without macular edema: Secondary | ICD-10-CM

## 2012-11-29 DIAGNOSIS — H35039 Hypertensive retinopathy, unspecified eye: Secondary | ICD-10-CM

## 2012-11-29 DIAGNOSIS — H35379 Puckering of macula, unspecified eye: Secondary | ICD-10-CM

## 2012-11-29 DIAGNOSIS — E1139 Type 2 diabetes mellitus with other diabetic ophthalmic complication: Secondary | ICD-10-CM

## 2012-12-18 ENCOUNTER — Ambulatory Visit (INDEPENDENT_AMBULATORY_CARE_PROVIDER_SITE_OTHER): Payer: Medicare PPO | Admitting: Family

## 2012-12-18 DIAGNOSIS — I4891 Unspecified atrial fibrillation: Secondary | ICD-10-CM

## 2012-12-18 LAB — PROTIME-INR: INR: 2.3 — AB (ref <=1.1)

## 2012-12-18 NOTE — Patient Instructions (Addendum)
7.5mg  every monday. All other days Continue 5mg  daily. Recheck in 4 weeks.  Anticoagulation Dose Instructions as of 12/18/2012     Glynis Smiles Tue Wed Thu Fri Sat   New Dose 5 mg 7.5 mg 5 mg 5 mg 5 mg 5 mg 5 mg    Description       7.5mg  every monday. All other days Continue 5mg  daily. Recheck in 4 weeks.

## 2012-12-30 ENCOUNTER — Other Ambulatory Visit: Payer: Self-pay | Admitting: Internal Medicine

## 2013-01-15 ENCOUNTER — Ambulatory Visit (INDEPENDENT_AMBULATORY_CARE_PROVIDER_SITE_OTHER): Payer: Medicare PPO | Admitting: Family

## 2013-01-15 DIAGNOSIS — I4891 Unspecified atrial fibrillation: Secondary | ICD-10-CM

## 2013-01-15 LAB — POCT INR: INR: 2.2

## 2013-01-15 NOTE — Patient Instructions (Addendum)
7.5mg  every monday. All other days Continue 5mg  daily. Recheck in 6 weeks.  Anticoagulation Dose Instructions as of 01/15/2013     Glynis Smiles Tue Wed Thu Fri Sat   New Dose 5 mg 7.5 mg 5 mg 5 mg 5 mg 5 mg 5 mg    Description       7.5mg  every monday. All other days Continue 5mg  daily. Recheck in 6 weeks.

## 2013-02-26 ENCOUNTER — Encounter: Payer: Medicare PPO | Admitting: Family

## 2013-02-27 ENCOUNTER — Ambulatory Visit (INDEPENDENT_AMBULATORY_CARE_PROVIDER_SITE_OTHER): Payer: Medicare PPO | Admitting: Family

## 2013-02-27 DIAGNOSIS — I4891 Unspecified atrial fibrillation: Secondary | ICD-10-CM

## 2013-02-27 LAB — POCT INR: INR: 2.2

## 2013-02-27 NOTE — Patient Instructions (Addendum)
7.5mg  every monday. All other days Continue 5mg  daily. Recheck in 6 weeks. Anticoagulation Dose Instructions as of 02/27/2013     Collin [name] Tue Wed Thu Fri Sat   New Dose 5 mg 7.5 mg 5 mg 5 mg 5 mg 5 mg 5 mg    Description       7.5mg  every monday. All other days Continue 5mg  daily. Recheck in 6 weeks.

## 2013-03-28 ENCOUNTER — Encounter: Payer: Self-pay | Admitting: *Deleted

## 2013-04-06 ENCOUNTER — Other Ambulatory Visit: Payer: Self-pay | Admitting: Family Medicine

## 2013-04-10 ENCOUNTER — Encounter: Payer: Medicare PPO | Admitting: Family

## 2013-04-11 ENCOUNTER — Encounter: Payer: Self-pay | Admitting: Internal Medicine

## 2013-04-11 ENCOUNTER — Ambulatory Visit (INDEPENDENT_AMBULATORY_CARE_PROVIDER_SITE_OTHER): Payer: Medicare HMO | Admitting: Internal Medicine

## 2013-04-11 DIAGNOSIS — I428 Other cardiomyopathies: Secondary | ICD-10-CM

## 2013-04-11 DIAGNOSIS — Z95810 Presence of automatic (implantable) cardiac defibrillator: Secondary | ICD-10-CM

## 2013-04-11 LAB — ICD DEVICE OBSERVATION
BATTERY VOLTAGE: 2.55 V
DEV-0020ICD: NEGATIVE
DEVICE MODEL ICD: 202844
VENTRICULAR PACING ICD: 9 pct

## 2013-04-11 NOTE — Patient Instructions (Signed)
Your physician has requested that you have an echocardiogram. Echocardiography is a painless test that uses sound waves to create images of your heart. It provides your doctor with information about the size and shape of your heart and how well your heart's chambers and valves are working. This procedure takes approximately one hour. There are no restrictions for this procedure.   

## 2013-04-12 ENCOUNTER — Encounter: Payer: Self-pay | Admitting: Internal Medicine

## 2013-04-12 ENCOUNTER — Ambulatory Visit (INDEPENDENT_AMBULATORY_CARE_PROVIDER_SITE_OTHER): Payer: Medicare PPO | Admitting: General Practice

## 2013-04-12 DIAGNOSIS — I4891 Unspecified atrial fibrillation: Secondary | ICD-10-CM

## 2013-04-12 DIAGNOSIS — Z7901 Long term (current) use of anticoagulants: Secondary | ICD-10-CM

## 2013-04-12 LAB — POCT INR: INR: 1.9

## 2013-04-12 NOTE — Progress Notes (Signed)
HPI [name] returns today for followup. He is a very pleasant 72 year old man with a nonischemic cardiomyopathy and chronic systolic heart failure, status post ICD implantation. He has reached elective replacement on his device. He has received no ICD shocks since he has had his device. Repeat 2-D echo 2 years ago demonstrated near normalization of his left ventricular function with an ejection fraction which previously been 20% having risen to 50%. In the interim he has had no syncope. Allergies  Allergen Reactions  . Niaspan (Niacin Er) Other (See Comments)    flushes     Current Outpatient Prescriptions  Medication Sig Dispense Refill  . carvedilol (COREG) 25 MG tablet TAKE 1 TABLET TWICE DAILY WITH MEALS  180 tablet  PRN  . digoxin (LANOXIN) 0.25 MG tablet Take 1 tablet (250 mcg total) by mouth daily.  90 tablet  3  . furosemide (LASIX) 20 MG tablet One daily as needed for leg edema  30 tablet  3  . gemfibrozil (LOPID) 600 MG tablet TAKE 1 TABLET TWICE DAILY BEFORE A MEAL  180 tablet  3  . metFORMIN (GLUCOPHAGE) 500 MG tablet TAKE 1 TABLET TWICE DAILY WITH MEALS  180 tablet  PRN  . niacin 500 MG tablet Take 500 mg by mouth daily.      . pravastatin (PRAVACHOL) 40 MG tablet TAKE 1 TABLET EVERY EVENING  90 tablet  PRN  . ramipril (ALTACE) 5 MG capsule TAKE 1 CAPSULE DAILY  90 capsule  PRN  . warfarin (COUMADIN) 5 MG tablet TAKE 1 TABLET EVERY DAY  90 tablet  PRN   No current facility-administered medications for this visit.     Past Medical History  Diagnosis Date  . Diabetes mellitus   . Heart disease   . Hyperlipidemia   . Cardiomyopathy, nonischemic 1998  . Atrial fibrillation   . OSA (obstructive sleep apnea)     ROS:   All systems reviewed and negative except as noted in the HPI.   Past Surgical History  Procedure Laterality Date  . Cardiac defibrillator placement  2006     Family History  Problem Relation Age of Onset  . Heart disease Mother   . Sudden  death Mother     under age 64  . Lupus Mother   . Hypertension Father   . Stroke Father      History   Social History  . Marital Status: Married    Spouse Name: N/A    Number of Children: N/A  . Years of Education: N/A   Occupational History  . Not on file.   Social History Main Topics  . Smoking status: Never Smoker   . Smokeless tobacco: Not on file  . Alcohol Use: No  . Drug Use: Not on file  . Sexually Active: Not on file   Other Topics Concern  . Not on file   Social History Narrative  . No narrative on file     BP 109/71  Pulse 95  Ht 6\' 1"  (1.854 m)  Wt 279 lb (126.554 kg)  BMI 36.82 kg/m2  SpO2 94%  Physical Exam:  Well appearing 72 year old man, NAD HEENT: Unremarkable Neck:  7 cm JVD, no thyromegally Lungs:  Clear with no wheezes, rales, or rhonchi. HEART:  Regular rate rhythm, no murmurs, no rubs, no clicks Abd:  soft, positive bowel sounds, no organomegally, no rebound, no guarding Ext:  2 plus pulses, no edema, no cyanosis, no clubbing Skin:  No rashes no nodules Neuro:  CN II through XII intact, motor grossly intact   DEVICE  Normal device function.  See PaceArt for details.  Device at elective replacement Assess/Plan:

## 2013-04-12 NOTE — Assessment & Plan Note (Signed)
His device is at elective replacement. It is unclear to me that he needs another device placed as his previous left ventricular function had improved. My plan is to repeat his 2-D echo. If his ejection fraction remains improved, then we would no recommend ICD generator removal and insertion of a new device. If his left ventricular function remains low, then generator change would be recommended in the setting of left ventricular dysfunction and congestive heart failure.

## 2013-04-16 ENCOUNTER — Encounter: Payer: Self-pay | Admitting: Family Medicine

## 2013-04-16 ENCOUNTER — Ambulatory Visit (INDEPENDENT_AMBULATORY_CARE_PROVIDER_SITE_OTHER): Payer: Medicare PPO | Admitting: Family Medicine

## 2013-04-16 VITALS — BP 100/68 | HR 106 | Temp 99.7°F | Wt 277.0 lb

## 2013-04-16 DIAGNOSIS — J209 Acute bronchitis, unspecified: Secondary | ICD-10-CM

## 2013-04-16 DIAGNOSIS — J069 Acute upper respiratory infection, unspecified: Secondary | ICD-10-CM

## 2013-04-16 DIAGNOSIS — Z7901 Long term (current) use of anticoagulants: Secondary | ICD-10-CM

## 2013-04-16 DIAGNOSIS — Z9229 Personal history of other drug therapy: Secondary | ICD-10-CM

## 2013-04-16 MED ORDER — DOXYCYCLINE HYCLATE 50 MG PO CAPS: 50.0000 mg | ORAL_CAPSULE | Freq: Two times a day (BID) | ORAL | Status: DC

## 2013-04-16 MED ORDER — HYDROCODONE-HOMATROPINE 5-1.5 MG/5ML PO SYRP: 5.0000 mL | ORAL_SOLUTION | Freq: Every evening | ORAL | Status: DC | PRN

## 2013-04-16 MED ORDER — ALBUTEROL SULFATE HFA 108 (90 BASE) MCG/ACT IN AERS: 2.0000 | INHALATION_SPRAY | Freq: Four times a day (QID) | RESPIRATORY_TRACT | Status: DC | PRN

## 2013-04-16 NOTE — Patient Instructions (Signed)
-  follow up with coumadin clinic in 5-7 days as the antibiotic can impact your coumadin levels  -take medications as prescribed

## 2013-04-16 NOTE — Progress Notes (Signed)
Chief Complaint  Patient presents with  . Cough    fever started this weekend;     HPI:  Collin [name] is a 72 yo M pt of Dr. Caryl Never here for acute visit for cough: -started 3 days ago and getting worse -symptoms: nasal congestion, cough, sore throat, subjective fever, wheezing -has tried nettie pot, dayquil, nightquil -denies: documented fevers, SOB, HA -grandkids sick too  ROS: See pertinent positives and negatives per HPI.  Past Medical History  Diagnosis Date  . Diabetes mellitus   . Heart disease   . Hyperlipidemia   . Cardiomyopathy, nonischemic 1998  . Atrial fibrillation   . OSA (obstructive sleep apnea)     Family History  Problem Relation Age of Onset  . Heart disease Mother   . Sudden death Mother     under age 38  . Lupus Mother   . Hypertension Father   . Stroke Father     History   Social History  . Marital Status: Married    Spouse Name: N/A    Number of Children: N/A  . Years of Education: N/A   Social History Main Topics  . Smoking status: Never Smoker   . Smokeless tobacco: None  . Alcohol Use: No  . Drug Use: None  . Sexually Active: None   Other Topics Concern  . None   Social History Narrative  . None    Current outpatient prescriptions:carvedilol (COREG) 25 MG tablet, TAKE 1 TABLET TWICE DAILY WITH MEALS, Disp: 180 tablet, Rfl: PRN;  digoxin (LANOXIN) 0.25 MG tablet, Take 1 tablet (250 mcg total) by mouth daily., Disp: 90 tablet, Rfl: 3;  furosemide (LASIX) 20 MG tablet, One daily as needed for leg edema, Disp: 30 tablet, Rfl: 3;  gemfibrozil (LOPID) 600 MG tablet, TAKE 1 TABLET TWICE DAILY BEFORE A MEAL, Disp: 180 tablet, Rfl: 3 metFORMIN (GLUCOPHAGE) 500 MG tablet, TAKE 1 TABLET TWICE DAILY WITH MEALS, Disp: 180 tablet, Rfl: PRN;  niacin 500 MG tablet, Take 500 mg by mouth daily., Disp: , Rfl: ;  pravastatin (PRAVACHOL) 40 MG tablet, TAKE 1 TABLET EVERY EVENING, Disp: 90 tablet, Rfl: PRN;  ramipril (ALTACE) 5 MG capsule, TAKE 1  CAPSULE DAILY, Disp: 90 capsule, Rfl: PRN;  warfarin (COUMADIN) 5 MG tablet, TAKE 1 TABLET EVERY DAY, Disp: 90 tablet, Rfl: PRN albuterol (PROVENTIL HFA;VENTOLIN HFA) 108 (90 BASE) MCG/ACT inhaler, Inhale 2 puffs into the lungs every 6 (six) hours as needed for wheezing., Disp: 1 Inhaler, Rfl: 0;  doxycycline (VIBRAMYCIN) 50 MG capsule, Take 1 capsule (50 mg total) by mouth 2 (two) times daily., Disp: 20 capsule, Rfl: 0;  HYDROcodone-homatropine (HYCODAN) 5-1.5 MG/5ML syrup, Take 5 mLs by mouth at bedtime as needed for cough., Disp: 120 mL, Rfl: 0  EXAM:  Filed Vitals:   04/16/13 1324  BP: 100/68  Pulse: 106  Temp: 99.7 F (37.6 C)    Body mass index is 36.55 kg/(m^2).  GENERAL: vitals reviewed and listed above, alert, oriented, appears well hydrated and in no acute distress  HEENT: atraumatic, conjunttiva clear, no obvious abnormalities on inspection of external nose and ears, normal appearance of ear canals and TMs, clear nasal congestion, mild post oropharyngeal erythema with PND, no tonsillar edema or exudate, no sinus TTP  NECK: no obvious masses on inspection  LUNGS: few scattered exp wheezes, no rales or rhonchi, good air movement  CV: HRRR, no peripheral edema  MS: moves all extremities without noticeable abnormality  PSYCH: pleasant and cooperative, no obvious  depression or anxiety  ASSESSMENT AND PLAN:  Discussed the following assessment and plan:  Acute bronchitis - Plan: HYDROcodone-homatropine (HYCODAN) 5-1.5 MG/5ML syrup, albuterol (PROVENTIL HFA;VENTOLIN HFA) 108 (90 BASE) MCG/ACT inhaler, doxycycline (VIBRAMYCIN) 50 MG capsule  Upper respiratory infection  Hx of long term use of blood thinners  -discussed risks/interactions, return precuations -Patient advised to return or notify a doctor immediately if symptoms worsen or persist or new concerns arise.  Patient Instructions  -follow up with coumadin clinic in 5-7 days as the antibiotic can impact your  coumadin levels  -take medications as prescribed     Allisha Harter R.

## 2013-04-17 ENCOUNTER — Ambulatory Visit (HOSPITAL_COMMUNITY): Payer: Medicare PPO | Attending: Cardiology | Admitting: Radiology

## 2013-04-17 DIAGNOSIS — I428 Other cardiomyopathies: Secondary | ICD-10-CM | POA: Insufficient documentation

## 2013-04-17 DIAGNOSIS — I4891 Unspecified atrial fibrillation: Secondary | ICD-10-CM | POA: Insufficient documentation

## 2013-04-17 DIAGNOSIS — I509 Heart failure, unspecified: Secondary | ICD-10-CM | POA: Insufficient documentation

## 2013-04-17 DIAGNOSIS — E669 Obesity, unspecified: Secondary | ICD-10-CM | POA: Insufficient documentation

## 2013-04-17 DIAGNOSIS — E785 Hyperlipidemia, unspecified: Secondary | ICD-10-CM | POA: Insufficient documentation

## 2013-04-17 DIAGNOSIS — N179 Acute kidney failure, unspecified: Secondary | ICD-10-CM | POA: Insufficient documentation

## 2013-04-17 DIAGNOSIS — G4733 Obstructive sleep apnea (adult) (pediatric): Secondary | ICD-10-CM | POA: Insufficient documentation

## 2013-04-17 DIAGNOSIS — E119 Type 2 diabetes mellitus without complications: Secondary | ICD-10-CM | POA: Insufficient documentation

## 2013-04-17 DIAGNOSIS — E1149 Type 2 diabetes mellitus with other diabetic neurological complication: Secondary | ICD-10-CM | POA: Insufficient documentation

## 2013-04-17 NOTE — Progress Notes (Signed)
Echocardiogram performed.  

## 2013-04-18 ENCOUNTER — Other Ambulatory Visit: Payer: Self-pay | Admitting: Internal Medicine

## 2013-04-18 ENCOUNTER — Other Ambulatory Visit: Payer: Self-pay | Admitting: Family Medicine

## 2013-04-23 ENCOUNTER — Ambulatory Visit (INDEPENDENT_AMBULATORY_CARE_PROVIDER_SITE_OTHER): Payer: Medicare PPO | Admitting: General Practice

## 2013-04-23 DIAGNOSIS — I4891 Unspecified atrial fibrillation: Secondary | ICD-10-CM

## 2013-04-23 DIAGNOSIS — Z7901 Long term (current) use of anticoagulants: Secondary | ICD-10-CM

## 2013-04-23 LAB — POCT INR: INR: 3.2

## 2013-05-03 ENCOUNTER — Ambulatory Visit (INDEPENDENT_AMBULATORY_CARE_PROVIDER_SITE_OTHER): Payer: Medicare PPO | Admitting: Internal Medicine

## 2013-05-03 ENCOUNTER — Encounter: Payer: Self-pay | Admitting: Internal Medicine

## 2013-05-03 VITALS — BP 120/82 | HR 79 | Ht 73.0 in | Wt 273.4 lb

## 2013-05-03 DIAGNOSIS — R0602 Shortness of breath: Secondary | ICD-10-CM

## 2013-05-03 DIAGNOSIS — Z79899 Other long term (current) drug therapy: Secondary | ICD-10-CM

## 2013-05-03 DIAGNOSIS — I5022 Chronic systolic (congestive) heart failure: Secondary | ICD-10-CM

## 2013-05-03 DIAGNOSIS — E785 Hyperlipidemia, unspecified: Secondary | ICD-10-CM

## 2013-05-03 DIAGNOSIS — I4891 Unspecified atrial fibrillation: Secondary | ICD-10-CM

## 2013-05-03 DIAGNOSIS — I509 Heart failure, unspecified: Secondary | ICD-10-CM

## 2013-05-03 NOTE — Progress Notes (Signed)
HPI Patient is a 72 year old with NICM, s/p ICD. Also a history of afib, DM, HL and sleep apnea I saw him once in Jan 2014  Echo at one point showed LVEF 50 to 55% Last seen in cardiology by [name] in  for ICD check earlier this month.  Had echo done after to reeval LVEF.   The patient denies CP  Breathing is stable  No PND Tr edema.  No dizziness.  Allergies  Allergen Reactions  . Niaspan [Niacin Er] Other (See Comments)    flushes    Current Outpatient Prescriptions  Medication Sig Dispense Refill  . albuterol (PROVENTIL HFA;VENTOLIN HFA) 108 (90 BASE) MCG/ACT inhaler Inhale 2 puffs into the lungs every 6 (six) hours as needed for wheezing.  1 Inhaler  0  . carvedilol (COREG) 25 MG tablet TAKE 1 TABLET TWICE DAILY WITH MEALS  180 tablet  PRN  . digoxin (LANOXIN) 0.25 MG tablet TAKE 1 TABLET DAILY  90 tablet  3  . doxycycline (VIBRAMYCIN) 50 MG capsule Take 1 capsule (50 mg total) by mouth 2 (two) times daily.  20 capsule  0  . furosemide (LASIX) 20 MG tablet One daily as needed for leg edema  30 tablet  3  . gemfibrozil (LOPID) 600 MG tablet TAKE 1 TABLET TWICE DAILY BEFORE A MEAL  180 tablet  3  . HYDROcodone-homatropine (HYCODAN) 5-1.5 MG/5ML syrup Take 5 mLs by mouth at bedtime as needed for cough.  120 mL  0  . metFORMIN (GLUCOPHAGE) 500 MG tablet TAKE 1 TABLET TWICE DAILY WITH MEALS  180 tablet  3  . niacin 500 MG tablet Take 500 mg by mouth daily.      . pravastatin (PRAVACHOL) 40 MG tablet TAKE 1 TABLET EVERY EVENING  90 tablet  PRN  . ramipril (ALTACE) 5 MG capsule TAKE 1 CAPSULE DAILY  90 capsule  3  . warfarin (COUMADIN) 5 MG tablet TAKE 1 TABLET EVERY DAY  90 tablet  PRN   No current facility-administered medications for this visit.    Past Medical History  Diagnosis Date  . Diabetes mellitus   . Heart disease   . Hyperlipidemia   . Cardiomyopathy, nonischemic 1998  . Atrial fibrillation   . OSA (obstructive sleep apnea)     Past Surgical History  Procedure  Laterality Date  . Cardiac defibrillator placement  2006    Family History  Problem Relation Age of Onset  . Heart disease Mother   . Sudden death Mother     under age 39  . Lupus Mother   . Hypertension Father   . Stroke Father     History   Social History  . Marital Status: Married    Spouse Name: N/A    Number of Children: N/A  . Years of Education: N/A   Occupational History  . Not on file.   Social History Main Topics  . Smoking status: Never Smoker   . Smokeless tobacco: Not on file  . Alcohol Use: No  . Drug Use: No  . Sexual Activity: Not on file   Other Topics Concern  . Not on file   Social History Narrative  . No narrative on file    Review of Systems:  All systems reviewed.  They are negative to the above problem except as previously stated.  Vital Signs: BP 120/82  Pulse 79  Ht 6\' 1"  (1.854 m)  Wt 273 lb 6.4 oz (124.013 kg)  BMI 36.08  kg/m2  Physical Exam  HEENT:  Normocephalic, atraumatic. EOMI, PERRLA.  Neck: JVP is normal.  No bruits.  Lungs: clear to auscultation. No rales no wheezes.  Heart: Regular rate and rhythm. Normal S1, S2. No S3.   No significant murmurs. PMI not displaced.  Abdomen:  Supple, nontender. Normal bowel sounds. No masses. No hepatomegaly.  Extremities:   Good distal pulses throughout. No lower extremity edema.  Musculoskeletal :moving all extremities.  Neuro:   alert and oriented x3.  CN II-XII grossly intact.  EKG  Atrial fibrillation.  79 bpm  Demand pacer activity.    Assessment and Plan:  1.  Atrial fibrillation  Continue rate control and coumadin  2. CM  Echo with LVEF 35 to 40%  Volume status looks goodk  Would check labs.  Will forward to [employer] re device  3.  HTN  Continue meds  4.  HL  Check labs.

## 2013-05-03 NOTE — Patient Instructions (Addendum)
Your physician recommends that you return for a FASTING lipid profile:  Return 05/21/13 for labs  Your physician wants you to follow-up in: 8 months You will receive a reminder letter in the mail two months in advance. If you don't receive a letter, please call our office to schedule the follow-up appointment.

## 2013-05-21 ENCOUNTER — Ambulatory Visit (INDEPENDENT_AMBULATORY_CARE_PROVIDER_SITE_OTHER): Payer: Medicare PPO | Admitting: General Practice

## 2013-05-21 ENCOUNTER — Other Ambulatory Visit (INDEPENDENT_AMBULATORY_CARE_PROVIDER_SITE_OTHER): Payer: Medicare PPO

## 2013-05-21 ENCOUNTER — Other Ambulatory Visit: Payer: Medicare PPO

## 2013-05-21 DIAGNOSIS — Z7901 Long term (current) use of anticoagulants: Secondary | ICD-10-CM

## 2013-05-21 DIAGNOSIS — Z79899 Other long term (current) drug therapy: Secondary | ICD-10-CM

## 2013-05-21 DIAGNOSIS — I4891 Unspecified atrial fibrillation: Secondary | ICD-10-CM

## 2013-05-21 DIAGNOSIS — E785 Hyperlipidemia, unspecified: Secondary | ICD-10-CM

## 2013-05-21 DIAGNOSIS — R0602 Shortness of breath: Secondary | ICD-10-CM

## 2013-05-21 LAB — BRAIN NATRIURETIC PEPTIDE: Pro B Natriuretic peptide (BNP): 119 pg/mL — ABNORMAL HIGH (ref 0.0–100.0)

## 2013-05-21 LAB — CBC
HCT: 43.7 % (ref 39.0–52.0)
Hemoglobin: 14.7 g/dL (ref 13.0–17.0)
MCHC: 33.7 g/dL (ref 30.0–36.0)
MCV: 90.7 fl (ref 78.0–100.0)
Platelets: 210 10*3/uL (ref 150.0–400.0)
RBC: 4.81 Mil/uL (ref 4.22–5.81)
RDW: 14.3 % (ref 11.5–14.6)
WBC: 3.8 10*3/uL — ABNORMAL LOW (ref 4.5–10.5)

## 2013-05-21 LAB — LIPID PANEL
Cholesterol: 168 mg/dL (ref 0–200)
HDL: 33.3 mg/dL — ABNORMAL LOW (ref >39.00)
LDL Cholesterol: 95 mg/dL (ref 0–99)
Total CHOL/HDL Ratio: 5
Triglycerides: 200 mg/dL — ABNORMAL HIGH (ref 0.0–149.0)
VLDL: 40 mg/dL (ref 0.0–40.0)

## 2013-05-21 LAB — BASIC METABOLIC PANEL
BUN: 18 mg/dL (ref 6–23)
CO2: 27 mEq/L (ref 19–32)
Calcium: 9 mg/dL (ref 8.4–10.5)
Chloride: 105 mEq/L (ref 96–112)
Creatinine, Ser: 0.9 mg/dL (ref 0.4–1.5)
GFR: 92.84 mL/min (ref >60.00)
Glucose, Bld: 118 mg/dL — ABNORMAL HIGH (ref 70–99)
Potassium: 4.2 mEq/L (ref 3.5–5.1)
Sodium: 139 mEq/L (ref 135–145)

## 2013-05-21 LAB — TSH: TSH: 1.17 u[IU]/mL (ref 0.35–5.50)

## 2013-05-21 LAB — POCT INR: INR: 2.6

## 2013-05-22 LAB — DIGOXIN LEVEL: Digoxin Level: 0.6 ng/mL — ABNORMAL LOW (ref 0.8–2.0)

## 2013-05-23 ENCOUNTER — Other Ambulatory Visit: Payer: Self-pay | Admitting: *Deleted

## 2013-05-23 DIAGNOSIS — E7800 Pure hypercholesterolemia, unspecified: Secondary | ICD-10-CM

## 2013-05-24 ENCOUNTER — Ambulatory Visit: Payer: Medicare PPO

## 2013-06-01 ENCOUNTER — Other Ambulatory Visit: Payer: Self-pay | Admitting: Family Medicine

## 2013-06-18 ENCOUNTER — Ambulatory Visit (INDEPENDENT_AMBULATORY_CARE_PROVIDER_SITE_OTHER): Payer: Medicare PPO | Admitting: General Practice

## 2013-06-18 DIAGNOSIS — I4891 Unspecified atrial fibrillation: Secondary | ICD-10-CM

## 2013-06-18 DIAGNOSIS — Z7901 Long term (current) use of anticoagulants: Secondary | ICD-10-CM

## 2013-06-18 LAB — POCT INR: INR: 1.9

## 2013-07-12 ENCOUNTER — Other Ambulatory Visit: Payer: Self-pay

## 2013-07-16 ENCOUNTER — Ambulatory Visit (INDEPENDENT_AMBULATORY_CARE_PROVIDER_SITE_OTHER): Payer: Commercial Managed Care - HMO | Admitting: Family

## 2013-07-16 DIAGNOSIS — Z7901 Long term (current) use of anticoagulants: Secondary | ICD-10-CM

## 2013-07-16 DIAGNOSIS — I4891 Unspecified atrial fibrillation: Secondary | ICD-10-CM

## 2013-07-16 LAB — POCT INR: INR: 2.2

## 2013-07-16 NOTE — Patient Instructions (Signed)
continue to take 1 tablet all days except 1 1/2 tablet on Monday.  Re-check in 4 weeks.  Anticoagulation Dose Instructions as of 07/16/2013     Collin [name] Tue Wed Thu Fri Sat   New Dose 5 mg 7.5 mg 5 mg 5 mg 5 mg 5 mg 5 mg    Description       continue to take 1 tablet all days except 1 1/2 tablet on Monday.  Re-check in 4 weeks.

## 2013-07-24 ENCOUNTER — Other Ambulatory Visit: Payer: Commercial Managed Care - HMO

## 2013-07-25 ENCOUNTER — Ambulatory Visit (INDEPENDENT_AMBULATORY_CARE_PROVIDER_SITE_OTHER): Payer: Medicare HMO | Admitting: *Deleted

## 2013-07-25 DIAGNOSIS — E785 Hyperlipidemia, unspecified: Secondary | ICD-10-CM

## 2013-07-25 LAB — LIPID PANEL
Cholesterol: 165 mg/dL (ref 0–200)
HDL: 33.9 mg/dL — ABNORMAL LOW (ref >39.00)
Total CHOL/HDL Ratio: 5
Triglycerides: 255 mg/dL — ABNORMAL HIGH (ref 0.0–149.0)
VLDL: 51 mg/dL — ABNORMAL HIGH (ref 0.0–40.0)

## 2013-07-25 LAB — LDL CHOLESTEROL, DIRECT: Direct LDL: 95.5 mg/dL

## 2013-08-13 ENCOUNTER — Ambulatory Visit (INDEPENDENT_AMBULATORY_CARE_PROVIDER_SITE_OTHER): Payer: Medicare HMO | Admitting: General Practice

## 2013-08-13 DIAGNOSIS — Z7901 Long term (current) use of anticoagulants: Secondary | ICD-10-CM

## 2013-08-13 LAB — POCT INR: INR: 2.1

## 2013-08-13 NOTE — Progress Notes (Signed)
Pre-visit discussion using our clinic review tool. No additional management support is needed unless otherwise documented below in the visit note.  

## 2013-09-08 ENCOUNTER — Encounter: Payer: Self-pay | Admitting: Family Medicine

## 2013-09-08 ENCOUNTER — Ambulatory Visit (INDEPENDENT_AMBULATORY_CARE_PROVIDER_SITE_OTHER): Payer: Medicare HMO | Admitting: Family Medicine

## 2013-09-08 VITALS — BP 120/72 | HR 90 | Temp 97.7°F | Resp 20 | Wt 270.0 lb

## 2013-09-08 DIAGNOSIS — J069 Acute upper respiratory infection, unspecified: Secondary | ICD-10-CM

## 2013-09-08 DIAGNOSIS — J029 Acute pharyngitis, unspecified: Secondary | ICD-10-CM

## 2013-09-08 DIAGNOSIS — I889 Nonspecific lymphadenitis, unspecified: Secondary | ICD-10-CM

## 2013-09-08 MED ORDER — HYDROCODONE-HOMATROPINE 5-1.5 MG/5ML PO SYRP: 5.0000 mL | ORAL_SOLUTION | Freq: Three times a day (TID) | ORAL | Status: DC | PRN

## 2013-09-08 NOTE — Progress Notes (Signed)
Pre-visit discussion using our clinic review tool. No additional management support is needed unless otherwise documented below in the visit note.  

## 2013-09-08 NOTE — Patient Instructions (Signed)
-  use a humidifier  -complete course of antibiotic  -follow up with your doctor in 1-2 weeks or see a doctor immediately if worsening

## 2013-09-08 NOTE — Progress Notes (Signed)
Chief Complaint  Patient presents with  . Cough  . chest congestion    HPI:  -started:1.5 weeks ago -symptoms:nasal congestion, sore throat, cough, drainage, globus sensation that was making swallowing uncomfortable initially that is now resolving, swollen glands in neck  -denies:fever, SOB, NVD, tooth pain -has tried: seen in [facility] and started on ceftin 36 hours ago and has improved over last 12 hours -sick contacts/travel/risks: denies flu exposure or Ebola risks, has had strep exposure -Hx of: allergies ROS: See pertinent positives and negatives per HPI.  Past Medical History  Diagnosis Date  . Diabetes mellitus   . Heart disease   . Hyperlipidemia   . Cardiomyopathy, nonischemic 1998  . Atrial fibrillation   . OSA (obstructive sleep apnea)     Past Surgical History  Procedure Laterality Date  . Cardiac defibrillator placement  2006    Family History  Problem Relation Age of Onset  . Heart disease Mother   . Sudden death Mother     under age 34  . Lupus Mother   . Hypertension Father   . Stroke Father     History   Social History  . Marital Status: Married    Spouse Name: N/A    Number of Children: N/A  . Years of Education: N/A   Social History Main Topics  . Smoking status: Never Smoker   . Smokeless tobacco: None  . Alcohol Use: No  . Drug Use: No  . Sexual Activity: None   Other Topics Concern  . None   Social History Narrative  . None    Current outpatient prescriptions:carvedilol (COREG) 25 MG tablet, TAKE 1 TABLET TWICE DAILY WITH MEALS, Disp: 180 tablet, Rfl: 3;  cefUROXime (CEFTIN) 250 MG tablet, Take 250 mg by mouth 2 (two) times daily with a meal. , Disp: , Rfl: ;  digoxin (LANOXIN) 0.25 MG tablet, TAKE 1 TABLET DAILY, Disp: 90 tablet, Rfl: 3;  metFORMIN (GLUCOPHAGE) 500 MG tablet, TAKE 1 TABLET TWICE DAILY WITH MEALS, Disp: 180 tablet, Rfl: 3 pravastatin (PRAVACHOL) 40 MG tablet, TAKE 1 TABLET EVERY EVENING, Disp: 90 tablet, Rfl: PRN;   ramipril (ALTACE) 5 MG capsule, TAKE 1 CAPSULE DAILY, Disp: 90 capsule, Rfl: 3;  warfarin (COUMADIN) 5 MG tablet, TAKE 1 TABLET EVERY DAY, Disp: 90 tablet, Rfl: PRN;  HYDROcodone-homatropine (HYCODAN) 5-1.5 MG/5ML syrup, Take 5 mLs by mouth every 8 (eight) hours as needed for cough., Disp: 120 mL, Rfl: 0  EXAM:  Filed Vitals:   09/08/13 0936  BP: 120/72  Pulse: 90  Temp: 97.7 F (36.5 C)  Resp: 20    Body mass index is 35.63 kg/(m^2).  GENERAL: vitals reviewed and listed above, alert, oriented, appears well hydrated and in no acute distress  HEENT: atraumatic, conjunttiva clear, no obvious abnormalities on inspection of external nose and ears, normal appearance of ear canals and TMs, clear nasal congestion, mild post oropharyngeal erythema with PND, no tonsillar edema or exudate, no sinus TTP  NECK: R LAD with swollen ant cervical nodes x 2 on R  LUNGS: clear to auscultation bilaterally, no wheezes, rales or rhonchi, good air movement  CV: HRRR, no peripheral edema  MS: moves all extremities without noticeable abnormality  PSYCH: pleasant and cooperative, no obvious depression or anxiety  ASSESSMENT AND PLAN:  Discussed the following assessment and plan:  Lymphadenitis - Plan: HYDROcodone-homatropine (HYCODAN) 5-1.5 MG/5ML syrup  Acute pharyngitis - Plan: HYDROcodone-homatropine (HYCODAN) 5-1.5 MG/5ML syrup  Acute upper respiratory infections of unspecified site - Plan:  HYDROcodone-homatropine (HYCODAN) 5-1.5 MG/5ML syrup  -we discussed possible serious and likely etiologies, workup and treatment, treatment risks and return precautions -after this discussion, since improving on antibiotic Lankford opted for continuation of abx, cough medication and close follow up -follow up advised with PCP in 1-2 weeks after completion of abx to ensure LAD resolving -of course, we advised [name]  to return or notify a doctor immediately if symptoms worsen or persist or new concerns  arise.  .     Patient Instructions  -use a humidifier  -complete course of antibiotic  -follow up with your doctor in 1-2 weeks or see a doctor immediately if worsening        Collin [name], Collin R.

## 2013-09-10 ENCOUNTER — Ambulatory Visit: Payer: Medicare HMO

## 2013-09-12 ENCOUNTER — Encounter: Payer: Self-pay | Admitting: *Deleted

## 2013-09-13 ENCOUNTER — Other Ambulatory Visit: Payer: Self-pay | Admitting: General Practice

## 2013-09-13 ENCOUNTER — Telehealth: Payer: Self-pay | Admitting: Family Medicine

## 2013-09-13 ENCOUNTER — Ambulatory Visit (INDEPENDENT_AMBULATORY_CARE_PROVIDER_SITE_OTHER): Payer: Medicare HMO | Admitting: General Practice

## 2013-09-13 DIAGNOSIS — I4891 Unspecified atrial fibrillation: Secondary | ICD-10-CM

## 2013-09-13 LAB — POCT INR: INR: 2.6

## 2013-09-13 MED ORDER — WARFARIN SODIUM 5 MG PO TABS: ORAL_TABLET | ORAL | Status: DC

## 2013-09-13 NOTE — Telephone Encounter (Signed)
[facility] requesting new script for warfarin (COUMADIN) 5 MG tablet #90

## 2013-09-13 NOTE — Progress Notes (Signed)
Pre-visit discussion using our clinic review tool. No additional management support is needed unless otherwise documented below in the visit note.  

## 2013-09-15 ENCOUNTER — Telehealth: Payer: Self-pay | Admitting: Family Medicine

## 2013-09-17 ENCOUNTER — Telehealth: Payer: Self-pay | Admitting: Family Medicine

## 2013-09-17 NOTE — Telephone Encounter (Signed)
[facility] Triage Call Report Triage Record Num: [id number] Operator: Jeanett Schlein Patient Name: Collin [name] Call Date & Time: 09/15/2013 11:46:43AM Patient Phone: [phone] PCP: Patient Gender: Male PCP Fax : Patient DOB: 11/28/1940 Practice Name: [facility] Reason for Call: Caller: Kennyth Lose; PCP: Carolann Littler (Family Practice); CB#: [phone]; Call regarding Coumadin 5mg  tabs. His mail order script has not arrived and needs medication. Order given for #7 tabs. Protocol(s) Used: Office Note Recommended Outcome per Protocol: Information Noted and Sent to Office Reason for Outcome: Caller information to office Care Advice: ~ 01/

## 2013-09-20 NOTE — Telephone Encounter (Signed)
[facility] Triage Call Report Triage Record Num: [id number] Operator: Jeanett Schlein Patient Name: Collin [name] Call Date & Time: 09/15/2013 11:46:43AM Patient Phone: [phone] PCP: Patient Gender: Male PCP Fax : Patient DOB: 1940/12/19 Practice Name: [facility]  Reason for Call: Caller: Kennyth Lose; PCP: Carolann Littler (Family Practice); CB#: [phone]; Call regarding Coumadin 5mg  tabs. His mail order script has not arrived and needs medication. Order given for #7 tabs.

## 2013-10-25 ENCOUNTER — Ambulatory Visit (INDEPENDENT_AMBULATORY_CARE_PROVIDER_SITE_OTHER): Payer: Medicare HMO | Admitting: General Practice

## 2013-10-25 DIAGNOSIS — I4891 Unspecified atrial fibrillation: Secondary | ICD-10-CM

## 2013-10-25 DIAGNOSIS — Z7901 Long term (current) use of anticoagulants: Secondary | ICD-10-CM

## 2013-10-25 DIAGNOSIS — Z5181 Encounter for therapeutic drug level monitoring: Secondary | ICD-10-CM

## 2013-10-25 LAB — POCT INR: INR: 2

## 2013-10-25 NOTE — Progress Notes (Signed)
Pre visit review using our clinic review tool, if applicable. No additional management support is needed unless otherwise documented below in the visit note. 

## 2013-11-29 ENCOUNTER — Ambulatory Visit (INDEPENDENT_AMBULATORY_CARE_PROVIDER_SITE_OTHER): Payer: Medicare HMO | Admitting: Ophthalmology

## 2013-11-29 DIAGNOSIS — H2510 Age-related nuclear cataract, unspecified eye: Secondary | ICD-10-CM

## 2013-11-29 DIAGNOSIS — E11319 Type 2 diabetes mellitus with unspecified diabetic retinopathy without macular edema: Secondary | ICD-10-CM

## 2013-11-29 DIAGNOSIS — H35039 Hypertensive retinopathy, unspecified eye: Secondary | ICD-10-CM

## 2013-11-29 DIAGNOSIS — E1139 Type 2 diabetes mellitus with other diabetic ophthalmic complication: Secondary | ICD-10-CM

## 2013-11-29 DIAGNOSIS — H35379 Puckering of macula, unspecified eye: Secondary | ICD-10-CM

## 2013-11-29 DIAGNOSIS — E1165 Type 2 diabetes mellitus with hyperglycemia: Secondary | ICD-10-CM

## 2013-11-29 DIAGNOSIS — I10 Essential (primary) hypertension: Secondary | ICD-10-CM

## 2013-11-29 DIAGNOSIS — H3530 Unspecified macular degeneration: Secondary | ICD-10-CM

## 2013-11-29 DIAGNOSIS — H43819 Vitreous degeneration, unspecified eye: Secondary | ICD-10-CM

## 2013-12-04 ENCOUNTER — Encounter: Payer: Self-pay | Admitting: Internal Medicine

## 2013-12-06 ENCOUNTER — Ambulatory Visit: Payer: Medicare HMO

## 2013-12-13 ENCOUNTER — Ambulatory Visit (INDEPENDENT_AMBULATORY_CARE_PROVIDER_SITE_OTHER): Payer: Commercial Managed Care - HMO | Admitting: General Practice

## 2013-12-13 DIAGNOSIS — I4891 Unspecified atrial fibrillation: Secondary | ICD-10-CM | POA: Diagnosis not present

## 2013-12-13 DIAGNOSIS — Z5181 Encounter for therapeutic drug level monitoring: Secondary | ICD-10-CM

## 2013-12-13 LAB — POCT INR: INR: 1.9

## 2013-12-13 NOTE — Progress Notes (Signed)
Pre visit review using our clinic review tool, if applicable. No additional management support is needed unless otherwise documented below in the visit note. 

## 2013-12-26 ENCOUNTER — Other Ambulatory Visit: Payer: Self-pay | Admitting: Internal Medicine

## 2013-12-26 ENCOUNTER — Other Ambulatory Visit: Payer: Self-pay | Admitting: Family Medicine

## 2013-12-26 ENCOUNTER — Other Ambulatory Visit: Payer: Self-pay | Admitting: General Practice

## 2013-12-26 MED ORDER — WARFARIN SODIUM 5 MG PO TABS: ORAL_TABLET | ORAL | Status: DC

## 2014-01-11 ENCOUNTER — Ambulatory Visit (INDEPENDENT_AMBULATORY_CARE_PROVIDER_SITE_OTHER): Payer: Medicare HMO | Admitting: Internal Medicine

## 2014-01-11 ENCOUNTER — Encounter: Payer: Self-pay | Admitting: Internal Medicine

## 2014-01-11 VITALS — BP 118/88 | HR 68 | Ht 72.0 in | Wt 231.0 lb

## 2014-01-11 DIAGNOSIS — I509 Heart failure, unspecified: Secondary | ICD-10-CM

## 2014-01-11 DIAGNOSIS — I4891 Unspecified atrial fibrillation: Secondary | ICD-10-CM

## 2014-01-11 DIAGNOSIS — R0602 Shortness of breath: Secondary | ICD-10-CM

## 2014-01-11 LAB — BASIC METABOLIC PANEL
BUN: 18 mg/dL (ref 6–23)
CO2: 25 mEq/L (ref 19–32)
Calcium: 9.4 mg/dL (ref 8.4–10.5)
Chloride: 105 mEq/L (ref 96–112)
Creatinine, Ser: 1 mg/dL (ref 0.4–1.5)
GFR: 80.65 mL/min (ref >60.00)
Glucose, Bld: 110 mg/dL — ABNORMAL HIGH (ref 70–99)
Potassium: 4.3 mEq/L (ref 3.5–5.1)
Sodium: 139 mEq/L (ref 135–145)

## 2014-01-11 LAB — CBC
HCT: 44.4 % (ref 39.0–52.0)
Hemoglobin: 14.8 g/dL (ref 13.0–17.0)
MCHC: 33.4 g/dL (ref 30.0–36.0)
MCV: 90.1 fl (ref 78.0–100.0)
Platelets: 183 10*3/uL (ref 150.0–400.0)
RBC: 4.93 Mil/uL (ref 4.22–5.81)
RDW: 14.8 % (ref 11.5–15.5)
WBC: 6.1 10*3/uL (ref 4.0–10.5)

## 2014-01-11 LAB — PROTIME-INR
INR: 2 ratio — ABNORMAL HIGH (ref 0.8–1.0)
Prothrombin Time: 22.2 s — ABNORMAL HIGH (ref 9.6–13.1)

## 2014-01-11 LAB — BRAIN NATRIURETIC PEPTIDE: Pro B Natriuretic peptide (BNP): 119 pg/mL — ABNORMAL HIGH (ref 0.0–100.0)

## 2014-01-11 NOTE — Patient Instructions (Signed)
Your physician recommends that you return for lab work TODAY (CBC, BMET, BNP, INR)  Your physician wants you to follow-up in: January, 2016 WITH [name].   You will receive a reminder letter in the mail two months in advance. If you don't receive a letter, please call our office to schedule the follow-up appointment.

## 2014-01-11 NOTE — Progress Notes (Signed)
HPI Patient is a 73 year old with NICM, s/p ICD. Also a history of afib, DM, HL and sleep apnea I saw him in [place] 2014.  He is also followd by Beckie Salts  Had echo done r to reeval LVEF.  LVEF was 35 to 40%  (8.2014) ICD battery has reached end of life  Sound turned off. Since seen, the patient denies CP  Breathing is stable  No PND Tr edema.  No dizziness.  Allergies  Allergen Reactions  . Niaspan [Niacin Er] Other (See Comments)    flushes    Current Outpatient Prescriptions  Medication Sig Dispense Refill  . carvedilol (COREG) 25 MG tablet TAKE 1 TABLET TWICE DAILY WITH MEALS  180 tablet  3  . digoxin (LANOXIN) 0.25 MG tablet TAKE 1 TABLET DAILY  90 tablet  3  . ibuprofen (ADVIL,MOTRIN) 200 MG tablet       . metFORMIN (GLUCOPHAGE) 500 MG tablet TAKE 1 TABLET TWICE DAILY WITH MEALS  180 tablet  3  . pravastatin (PRAVACHOL) 40 MG tablet TAKE 1 TABLET EVERY EVENING  90 tablet  0  . TYLENOL 500 MG tablet       . warfarin (COUMADIN) 5 MG tablet Take as directed by anticoagulation clinic  105 tablet  1  . ramipril (ALTACE) 5 MG capsule TAKE 1 CAPSULE DAILY  90 capsule  3   No current facility-administered medications for this visit.    Past Medical History  Diagnosis Date  . Diabetes mellitus   . Heart disease   . Hyperlipidemia   . Cardiomyopathy, nonischemic 1998  . Atrial fibrillation   . OSA (obstructive sleep apnea)     Past Surgical History  Procedure Laterality Date  . Cardiac defibrillator placement  2006    Family History  Problem Relation Age of Onset  . Heart disease Mother   . Sudden death Mother     under age 84  . Lupus Mother   . Hypertension Father   . Stroke Father     History   Social History  . Marital Status: Married    Spouse Name: N/A    Number of Children: N/A  . Years of Education: N/A   Occupational History  . Not on file.   Social History Main Topics  . Smoking status: Never Smoker   . Smokeless tobacco: Not on file  . Alcohol Use: No   . Drug Use: No  . Sexual Activity: Not on file   Other Topics Concern  . Not on file   Social History Narrative  . No narrative on file    Review of Systems:  All systems reviewed.  They are negative to the above problem except as previously stated.  Vital Signs: BP 118/88  Pulse 68  Ht 6' (1.829 m)  Wt 231 lb (104.781 kg)  BMI 31.32 kg/m2  Physical Exam Pat[place] is in AND  HEENT:  Normocephalic, atraumatic. EOMI, PERRLA.  Neck: JVP is normal.  No bruits.  Lungs: clear to auscultation. No rales no wheezes.  Heart: Regular rate and rhythm. Normal S1, S2. No S3.   No significant murmurs. PMI not displaced.  Abdomen:  Supple, nontender. Normal bowel sounds. No masses. No hepatomegaly.  Extremities:   Good distal pulses throughout. No lower extremity edema.  Musculoskeletal :moving all extremities.  Neuro:   alert and oriented x3.  CN II-XII grossly intact.   Assessment and Plan:  1.  Atrial fibrillation  Continue rate control and coumadin  Check INR  May be able to change check    2. CM  Echo with LVEF 35 to 40%  I have reviewed echo from august.  I think LVEF is better than reported.  I would keep on same regimn  BP on my check is 100/70  I would not push meds further.    3.  HTN  Continue meds  As noted above.    4.  HL  Continue meds.

## 2014-01-24 ENCOUNTER — Ambulatory Visit: Payer: Medicare HMO

## 2014-02-14 ENCOUNTER — Ambulatory Visit (INDEPENDENT_AMBULATORY_CARE_PROVIDER_SITE_OTHER): Payer: Commercial Managed Care - HMO | Admitting: General Practice

## 2014-02-14 DIAGNOSIS — Z5181 Encounter for therapeutic drug level monitoring: Secondary | ICD-10-CM

## 2014-02-14 DIAGNOSIS — I4891 Unspecified atrial fibrillation: Secondary | ICD-10-CM

## 2014-02-14 LAB — POCT INR: INR: 3

## 2014-02-14 NOTE — Progress Notes (Signed)
Pre visit review using our clinic review tool, if applicable. No additional management support is needed unless otherwise documented below in the visit note. 

## 2014-03-27 ENCOUNTER — Telehealth: Payer: Self-pay | Admitting: *Deleted

## 2014-03-27 DIAGNOSIS — E119 Type 2 diabetes mellitus without complications: Secondary | ICD-10-CM

## 2014-03-27 NOTE — Telephone Encounter (Signed)
Left message on machine for patient to schedule a  Lab/ office visit to follow up with diabetes. Message sent to mychart A1c, bmet, micro albumin Diabetic bundle

## 2014-03-28 ENCOUNTER — Ambulatory Visit (INDEPENDENT_AMBULATORY_CARE_PROVIDER_SITE_OTHER): Payer: Commercial Managed Care - HMO | Admitting: General Practice

## 2014-03-28 DIAGNOSIS — I4891 Unspecified atrial fibrillation: Secondary | ICD-10-CM

## 2014-03-28 DIAGNOSIS — Z5181 Encounter for therapeutic drug level monitoring: Secondary | ICD-10-CM

## 2014-03-28 LAB — POCT INR: INR: 2.2

## 2014-03-28 NOTE — Progress Notes (Signed)
Pre visit review using our clinic review tool, if applicable. No additional management support is needed unless otherwise documented below in the visit note. 

## 2014-04-01 ENCOUNTER — Other Ambulatory Visit: Payer: Self-pay | Admitting: Internal Medicine

## 2014-04-02 ENCOUNTER — Encounter: Payer: Self-pay | Admitting: Internal Medicine

## 2014-04-02 NOTE — Telephone Encounter (Signed)
Pt's device beeping every 4hrs. ERI notifier was turned off Aug/2014. ROV w/ device clinic 04/03/14.

## 2014-04-03 ENCOUNTER — Ambulatory Visit (INDEPENDENT_AMBULATORY_CARE_PROVIDER_SITE_OTHER): Payer: Commercial Managed Care - HMO | Admitting: *Deleted

## 2014-04-03 DIAGNOSIS — I428 Other cardiomyopathies: Secondary | ICD-10-CM

## 2014-04-03 LAB — MDC_IDC_ENUM_SESS_TYPE_INCLINIC
Battery Voltage: 2.45 V
Brady Statistic RA Percent Paced: 0 %
Brady Statistic RV Percent Paced: 9 %
Date Time Interrogation Session: 20150729040000
HighPow Impedance: 40 Ohm
Implantable Pulse Generator Serial Number: 202844
Lead Channel Impedance Value: 3000 Ohm
Lead Channel Impedance Value: 506 Ohm
Lead Channel Pacing Threshold Amplitude: 0.4 V
Lead Channel Pacing Threshold Amplitude: 0.6 V
Lead Channel Pacing Threshold Amplitude: 1.2 V
Lead Channel Pacing Threshold Amplitude: 1.6 V
Lead Channel Pacing Threshold Pulse Width: 0.5 ms
Lead Channel Pacing Threshold Pulse Width: 0.5 ms
Lead Channel Pacing Threshold Pulse Width: 0.5 ms
Lead Channel Pacing Threshold Pulse Width: 0.5 ms
Lead Channel Sensing Intrinsic Amplitude: 10.9 mV
Lead Channel Setting Pacing Amplitude: 2.4 V
Lead Channel Setting Pacing Pulse Width: 0.5 ms
Zone Setting Detection Interval: 300 ms

## 2014-04-03 NOTE — Progress Notes (Signed)
ICD check in clinic. Normal device function. 1 VF epidsode shock diverted for spontaneous conversion in March.   Histogram distribution appropriate for patient and level of activity. No changes made this session. Device programmed at appropriate safety margins. Device programmed to optimize intrinsic conduction.   Device reached ERI 7/14 and alerts were programmed off.  Per Dr. Lovena Le there was a question of whether to change the device out.  His last echo 8/14 his EF was 35-40%.  ROV 8/13 with Dr. Lovena Le to discuss change out.

## 2014-04-04 ENCOUNTER — Ambulatory Visit (INDEPENDENT_AMBULATORY_CARE_PROVIDER_SITE_OTHER): Payer: Commercial Managed Care - HMO | Admitting: Family Medicine

## 2014-04-04 ENCOUNTER — Encounter: Payer: Self-pay | Admitting: Family Medicine

## 2014-04-04 VITALS — BP 124/80 | HR 71 | Temp 97.8°F | Wt 288.0 lb

## 2014-04-04 DIAGNOSIS — I509 Heart failure, unspecified: Secondary | ICD-10-CM

## 2014-04-04 DIAGNOSIS — E119 Type 2 diabetes mellitus without complications: Secondary | ICD-10-CM

## 2014-04-04 DIAGNOSIS — I5022 Chronic systolic (congestive) heart failure: Secondary | ICD-10-CM

## 2014-04-04 DIAGNOSIS — I4891 Unspecified atrial fibrillation: Secondary | ICD-10-CM

## 2014-04-04 LAB — HEMOGLOBIN A1C: Hgb A1c MFr Bld: 6.7 % — ABNORMAL HIGH (ref 4.6–6.5)

## 2014-04-04 NOTE — Progress Notes (Signed)
Subjective:    Patient ID: Collin [name], male    DOB: 05/15/1941, 73 y.o.   MRN: [id number]  Diabetes Pertinent negatives for hypoglycemia include no dizziness or headaches. Pertinent negatives for diabetes include no chest pain, no fatigue, no polydipsia, no polyuria and no weakness.   Medical followup. Chronic problems include obesity, type 2 diabetes, systolic heart failure, history of atrial fibrillation, hyperlipidemia, obstructive sleep apnea. He has not had close followup with diabetes. Last A1c was stable. He does not monitor blood sugars regularly. No symptoms of hyperglycemia. Diabetes treated with metformin. No side effects.  History of heart failure. No orthopnea or dyspnea with exertion. He did have some mild foot edema late in day. He remains on carvedilol, Altace, and digoxin. Most recent echo reviewed August 2014 ejection fraction 35-40%.  Atrial fibrillation on Coumadin. No bleeding complications. INRs have been stable.  Last tetanus unknown. No history of shingles vaccine. Is unsure if he has coverage for these.  Past Medical History  Diagnosis Date  . Diabetes mellitus   . Heart disease   . Hyperlipidemia   . Cardiomyopathy, nonischemic 1998  . Atrial fibrillation   . OSA (obstructive sleep apnea)    Past Surgical History  Procedure Laterality Date  . Cardiac defibrillator placement  2006    reports that he has never smoked. He does not have any smokeless tobacco history on file. He reports that he does not drink alcohol or use illicit drugs. family history includes Heart disease in his mother; Hypertension in his father; Lupus in his mother; Stroke in his father; Sudden death in his mother. Allergies  Allergen Reactions  . Niaspan [Niacin Er] Other (See Comments)    flushes        Review of Systems  Constitutional: Negative for fatigue and unexpected weight change.  Eyes: Negative for visual disturbance.  Respiratory: Negative for cough, chest  tightness and shortness of breath.   Cardiovascular: Negative for chest pain and palpitations.  Endocrine: Negative for polydipsia and polyuria.  Genitourinary: Negative for dysuria.  Neurological: Negative for dizziness, syncope, weakness, light-headedness and headaches.       Objective:   Physical Exam  Constitutional: He is oriented to person, place, and time. He appears well-developed and well-nourished.  HENT:  Right Ear: External ear normal.  Left Ear: External ear normal.  Mouth/Throat: Oropharynx is clear and moist.  Eyes: Pupils are equal, round, and reactive to light.  Neck: Neck supple. No thyromegaly present.  Cardiovascular: Normal rate and regular rhythm.   Pulmonary/Chest: Effort normal and breath sounds normal. No respiratory distress. He has no wheezes. He has no rales.  Musculoskeletal: He exhibits edema.  He has trace nonpitting edema feet bilaterally  Neurological: He is alert and oriented to person, place, and time.  Skin:  He has fairly large callus left great toe otherwise no foot lesions. Feet are warm intact with good dorsalis pedis pulses. Normal sensory with monofilament testing          Assessment & Plan:  #1 type 2 diabetes. History of good control. Overdue for A1c. Repeat A1c. Continue regular eye exams #2 history of systolic heart failure symptomatically stable. Continue current medications #3 history of atrial fibrillation. Continue Coumadin. Continue followup with Coumadin clinic #4 health maintenance. Recommend yearly flu vaccine. Check on coverage for tetanus and shingles vaccine.

## 2014-04-04 NOTE — Progress Notes (Signed)
Pre visit review using our clinic review tool, if applicable. No additional management support is needed unless otherwise documented below in the visit note. 

## 2014-04-12 ENCOUNTER — Encounter: Payer: Self-pay | Admitting: Physician Assistant

## 2014-04-12 ENCOUNTER — Ambulatory Visit (INDEPENDENT_AMBULATORY_CARE_PROVIDER_SITE_OTHER): Payer: Commercial Managed Care - HMO | Admitting: Physician Assistant

## 2014-04-12 VITALS — BP 120/78 | HR 66 | Temp 98.4°F | Resp 18 | Wt 284.0 lb

## 2014-04-12 DIAGNOSIS — B029 Zoster without complications: Secondary | ICD-10-CM

## 2014-04-12 MED ORDER — VALACYCLOVIR HCL 1 G PO TABS: 1000.0000 mg | ORAL_TABLET | Freq: Three times a day (TID) | ORAL | Status: DC

## 2014-04-12 NOTE — Progress Notes (Signed)
Subjective:    Patient ID: Collin [name], male    DOB: 1940/12/24, 73 y.o.   MRN: [id number]  Rash This is a new problem. The current episode started in the past 7 days (2 days). The problem has been gradually worsening since onset. The affected locations include the chest and back. The rash is characterized by blistering, redness and itchiness. He was exposed to nothing. Pertinent negatives include no anorexia, congestion, cough, diarrhea, eye pain, facial edema, fatigue, fever, joint pain, nail changes, rhinorrhea, shortness of breath, sore throat or vomiting.      Review of Systems  Constitutional: Negative for fever, chills and fatigue.  HENT: Negative for congestion, rhinorrhea and sore throat.   Eyes: Negative for pain.  Respiratory: Negative for cough and shortness of breath.   Cardiovascular: Negative for chest pain.  Gastrointestinal: Negative for nausea, vomiting, abdominal pain, diarrhea and anorexia.  Musculoskeletal: Negative for arthralgias, back pain, gait problem, joint pain, joint swelling, myalgias, neck pain and neck stiffness.  Skin: Positive for rash. Negative for nail changes.  Neurological: Positive for weakness (he also noticed some weakness in his left leg that started at the same time, however he has no other leg symptoms. No injury.). Negative for syncope, numbness and headaches.  All other systems reviewed and are negative.    Past Medical History  Diagnosis Date  . Diabetes mellitus   . Heart disease   . Hyperlipidemia   . Cardiomyopathy, nonischemic 1998  . Atrial fibrillation   . OSA (obstructive sleep apnea)     History   Social History  . Marital Status: Married    Spouse Name: N/A    Number of Children: N/A  . Years of Education: N/A   Occupational History  . Not on file.   Social History Main Topics  . Smoking status: Never Smoker   . Smokeless tobacco: Not on file  . Alcohol Use: No  . Drug Use: No  . Sexual Activity: Not on  file   Other Topics Concern  . Not on file   Social History Narrative  . No narrative on file    Past Surgical History  Procedure Laterality Date  . Cardiac defibrillator placement  2006    Family History  Problem Relation Age of Onset  . Heart disease Mother   . Sudden death Mother     under age 33  . Lupus Mother   . Hypertension Father   . Stroke Father     Allergies  Allergen Reactions  . Niaspan [Niacin Er] Other (See Comments)    flushes    Current Outpatient Prescriptions on File Prior to Visit  Medication Sig Dispense Refill  . carvedilol (COREG) 25 MG tablet TAKE 1 TABLET TWICE DAILY WITH MEALS  180 tablet  3  . digoxin (LANOXIN) 0.25 MG tablet TAKE 1 TABLET DAILY  90 tablet  3  . metFORMIN (GLUCOPHAGE) 500 MG tablet TAKE 1 TABLET TWICE DAILY WITH MEALS  180 tablet  3  . pravastatin (PRAVACHOL) 40 MG tablet TAKE 1 TABLET EVERY EVENING  90 tablet  3  . ramipril (ALTACE) 5 MG capsule TAKE 1 CAPSULE DAILY  90 capsule  3  . TYLENOL 500 MG tablet       . warfarin (COUMADIN) 5 MG tablet Take as directed by anticoagulation clinic  105 tablet  1   No current facility-administered medications on file prior to visit.    EXAM: BP 120/78  Pulse 66  Temp(Src) 98.4 F (  36.9 C) (Oral)  Resp 18  Wt 284 lb (128.822 kg)     Objective:   Physical Exam  Nursing note and vitals reviewed. Constitutional: He is oriented to person, place, and time. He appears well-developed and well-nourished. No distress.  HENT:  Head: Normocephalic and atraumatic.  Eyes: Conjunctivae and EOM are normal. Pupils are equal, round, and reactive to light.  Cardiovascular: Normal rate, regular rhythm and intact distal pulses.   Pulmonary/Chest: Effort normal and breath sounds normal. No respiratory distress.  Musculoskeletal: Normal range of motion. He exhibits no edema and no tenderness.  Gait normal.  Neurological: He is alert and oriented to person, place, and time.  Sensation,  strength and reflexes all grossly intact.  Skin: Skin is warm and dry. Rash noted. He is not diaphoretic. No erythema. No pallor.  Vesicular rash in band from center of chest around the left chest to the back.  Psychiatric: He has a normal mood and affect. His behavior is normal. Judgment and thought content normal.     Lab Results  Component Value Date   WBC 6.1 01/11/2014   HGB 14.8 01/11/2014   HCT 44.4 01/11/2014   PLT 183.0 01/11/2014   GLUCOSE 110* 01/11/2014   CHOL 165 07/25/2013   TRIG 255.0* 07/25/2013   HDL 33.90* 07/25/2013   LDLDIRECT 95.5 07/25/2013   LDLCALC 95 05/21/2013   ALT 23 08/21/2011   AST 24 08/21/2011   NA 139 01/11/2014   K 4.3 01/11/2014   CL 105 01/11/2014   CREATININE 1.0 01/11/2014   BUN 18 01/11/2014   CO2 25 01/11/2014   TSH 1.17 05/21/2013   INR 2.2 03/28/2014   HGBA1C 6.7* 04/04/2014        Assessment & Plan:  Collin [name] was seen today for herpes zoster.  Diagnoses and associated orders for this visit:  Shingles Comments: Classic rash. Will treat with Valtrex. - valACYclovir (VALTREX) 1000 MG tablet; Take 1 tablet (1,000 mg total) by mouth 3 (three) times daily.    Uncertain as to leg weakness, non-specific complaints, will use watchful waiting. Pt amenable to this.  Return precautions provided, and patient handout on shingles.  Plan to follow up as needed, or for worsening or persistent symptoms despite treatment.  Patient Instructions  Valtrex three times daily for 7 days.  Continue to monitor your symptoms for improvement.  If emergency symptoms discussed during visit developed, seek medical attention immediately.  Followup as needed, or for worsening or persistent symptoms despite treatment.

## 2014-04-12 NOTE — Progress Notes (Signed)
Pre visit review using our clinic review tool, if applicable. No additional management support is needed unless otherwise documented below in the visit note. 

## 2014-04-12 NOTE — Patient Instructions (Addendum)
Valtrex three times daily for 7 days.  Continue to monitor your symptoms for improvement.  If emergency symptoms discussed during visit developed, seek medical attention immediately.  Followup as needed, or for worsening or persistent symptoms despite treatment.    Shingles Shingles is caused by the same virus that causes chickenpox. The first feelings may be pain or tingling. A rash will follow in a couple days. The rash may occur on any area of the body. Long-lasting pain is more likely in an elderly person. It can last months to years. There are medicines that can help prevent pain if you start taking them early. HOME CARE   Take cool baths or place cool cloths on the rash as told by your doctor.  Take medicine only as told by your doctor.  Rest as told by your doctor.  Keep your rash clean with mild soap and cool water or as told by your doctor.  Do not scratch your rash. You may use calamine lotion to relieve itchy skin as told by your doctor.  Keep your rash covered with a loose bandage (dressing).  Avoid touching:  Babies.  Pregnant women.  Children with inflamed skin (eczema).  People who have gotten organ transplants.  People with chronic illnesses, such as leukemia or AIDS.  Wear loose-fitting clothing.  If the rash is on the face, you may need to see a specialist. Keep all appointments. Shingles must be kept away from the eyes, if possible.  Keep all follow-up visits as told by your doctor. GET HELP RIGHT AWAY IF:   You have any pain on the face or eye.  You lose feeling on one side of your face.  You have ear pain or ringing in your ear.  You cannot taste as well.  Your medicines do not help the pain.  Your redness or puffiness (swelling) spreads.  You feel like you are getting worse.  You have a fever. MAKE SURE YOU:   Understand these instructions.  Will watch your condition.  Will get help right away if you are not doing well or get  worse. Document Released: 02/09/2008 Document Revised: 01/07/2014 Document Reviewed: 02/09/2008 [facility] Patient Information 2015 [place], [place]. This information is not intended to replace advice given to you by your health care provider. Make sure you discuss any questions you have with your health care provider.

## 2014-04-14 ENCOUNTER — Other Ambulatory Visit: Payer: Self-pay | Admitting: Internal Medicine

## 2014-04-14 ENCOUNTER — Other Ambulatory Visit: Payer: Self-pay | Admitting: Family Medicine

## 2014-04-18 ENCOUNTER — Encounter: Payer: Self-pay | Admitting: Internal Medicine

## 2014-04-18 ENCOUNTER — Ambulatory Visit (INDEPENDENT_AMBULATORY_CARE_PROVIDER_SITE_OTHER): Payer: Commercial Managed Care - HMO | Admitting: Internal Medicine

## 2014-04-18 VITALS — BP 131/81 | HR 71 | Ht 73.0 in | Wt 283.8 lb

## 2014-04-18 DIAGNOSIS — I509 Heart failure, unspecified: Secondary | ICD-10-CM

## 2014-04-18 DIAGNOSIS — I4891 Unspecified atrial fibrillation: Secondary | ICD-10-CM

## 2014-04-18 DIAGNOSIS — I5022 Chronic systolic (congestive) heart failure: Secondary | ICD-10-CM

## 2014-04-18 DIAGNOSIS — I4819 Other persistent atrial fibrillation: Secondary | ICD-10-CM

## 2014-04-18 NOTE — Assessment & Plan Note (Signed)
His ventricular rate appears to be well controlled. He will continue systemic anti-coagulation.

## 2014-04-18 NOTE — Assessment & Plan Note (Signed)
His [employer] ICD is at [employer]. Will plan to schedule device generator change out if his EF remains down.

## 2014-04-18 NOTE — Assessment & Plan Note (Signed)
His symptoms are class 2. I have recommended he continue his current meds. He will undergo repeat 2D echo as it has been over a year since his last echo.

## 2014-04-18 NOTE — Progress Notes (Signed)
HPI Collin [name] returns today for followup. He is a very pleasant 73 year old man with a nonischemic cardiomyopathy and chronic systolic heart failure, status post ICD implantation. He has reached elective replacement on his device. He has received no ICD shocks since he has had his device. Repeat 2D echo last year demonstrated an EF 35-40%. Previously it had been higher.  In the interim he has had no syncope. ICD interogation several months ago did demonstrate an episode of very fast VT in the VF zone which spontaneously terminated just prior to being shocked. He remains in atrial fib. Allergies  Allergen Reactions  . Niaspan [Niacin Er] Other (See Comments)    flushes     Current Outpatient Prescriptions  Medication Sig Dispense Refill  . carvedilol (COREG) 25 MG tablet TAKE 1 TABLET TWICE DAILY WITH MEALS  180 tablet  3  . digoxin (LANOXIN) 0.25 MG tablet TAKE 1 TABLET EVERY DAY  90 tablet  0  . metFORMIN (GLUCOPHAGE) 500 MG tablet TAKE 1 TABLET TWICE DAILY WITH MEALS  180 tablet  3  . pravastatin (PRAVACHOL) 40 MG tablet TAKE 1 TABLET EVERY EVENING  90 tablet  3  . ramipril (ALTACE) 5 MG capsule TAKE 1 CAPSULE DAILY  90 capsule  3  . TYLENOL 500 MG tablet Take 1,000 mg by mouth every 6 (six) hours as needed.       . valACYclovir (VALTREX) 1000 MG tablet Take 1 tablet (1,000 mg total) by mouth 3 (three) times daily.  21 tablet  0  . warfarin (COUMADIN) 5 MG tablet Take as directed by anticoagulation clinic  105 tablet  1   No current facility-administered medications for this visit.     Past Medical History  Diagnosis Date  . Diabetes mellitus   . Heart disease   . Hyperlipidemia   . Cardiomyopathy, nonischemic 1998  . Atrial fibrillation   . OSA (obstructive sleep apnea)     ROS:   All systems reviewed and negative except as noted in the HPI.   Past Surgical History  Procedure Laterality Date  . Cardiac defibrillator placement  2006     Family History  Problem  Relation Age of Onset  . Heart disease Mother   . Sudden death Mother     under age 42  . Lupus Mother   . Hypertension Father   . Stroke Father      History   Social History  . Marital Status: Married    Spouse Name: N/A    Number of Children: N/A  . Years of Education: N/A   Occupational History  . Not on file.   Social History Main Topics  . Smoking status: Never Smoker   . Smokeless tobacco: Not on file  . Alcohol Use: No  . Drug Use: No  . Sexual Activity: Not on file   Other Topics Concern  . Not on file   Social History Narrative  . No narrative on file     BP 131/81  Pulse 71  Ht 6\' 1"  (1.854 m)  Wt 283 lb 12.8 oz (128.731 kg)  BMI 37.45 kg/m2  Physical Exam:  Well appearing 73 year old man, NAD HEENT: Unremarkable Neck:  7 cm JVD, no thyromegally Lungs:  Clear with no wheezes, rales, or rhonchi. HEART:  IRegular rate rhythm, no murmurs, no rubs, no clicks Abd:  soft, positive bowel sounds, no organomegally, no rebound, no guarding Ext:  2 plus pulses, no edema, no cyanosis, no clubbing Skin:  No  rashes no nodules Neuro:  CN II through XII intact, motor grossly intact   DEVICE  Normal device function.  See PaceArt for details.  Device at elective replacement Assess/Plan:

## 2014-04-18 NOTE — Patient Instructions (Addendum)
Your physician recommends that you continue on your current medications as directed. Please refer to the Current Medication list given to you today.  Your physician has requested that you have an echocardiogram. Echocardiography is a painless test that uses sound waves to create images of your heart. It provides your doctor with information about the size and shape of your heart and how well your heart's chambers and valves are working. This procedure takes approximately one hour. There are no restrictions for this procedure.( To be scheduled next week)  Your physician recommends that you return for lab work on 05/08/14 between 7:30am-5;15pm    Your ICD generator is scheduled for 05/10/14 @ 9:30am. You have been written instructions today

## 2014-04-22 ENCOUNTER — Encounter: Payer: Self-pay | Admitting: Internal Medicine

## 2014-04-22 ENCOUNTER — Ambulatory Visit (HOSPITAL_COMMUNITY): Payer: Medicare HMO | Attending: Cardiology | Admitting: Radiology

## 2014-04-22 DIAGNOSIS — I428 Other cardiomyopathies: Secondary | ICD-10-CM

## 2014-04-22 DIAGNOSIS — I5022 Chronic systolic (congestive) heart failure: Secondary | ICD-10-CM

## 2014-04-22 NOTE — Progress Notes (Signed)
Echocardiogram performed.  

## 2014-05-01 ENCOUNTER — Telehealth: Payer: Self-pay | Admitting: Internal Medicine

## 2014-05-01 NOTE — Telephone Encounter (Signed)
New message ° ° ° ° ° °Want echo results °

## 2014-05-01 NOTE — Telephone Encounter (Signed)
lmom for patient with results of his echo.  Will show Dr Lovena Le tomorrow and call patient back with plan

## 2014-05-02 NOTE — Telephone Encounter (Signed)
Will proceed with generator change 9/4

## 2014-05-08 ENCOUNTER — Ambulatory Visit (INDEPENDENT_AMBULATORY_CARE_PROVIDER_SITE_OTHER): Payer: Commercial Managed Care - HMO

## 2014-05-08 ENCOUNTER — Encounter (HOSPITAL_COMMUNITY): Payer: Self-pay | Admitting: Pharmacy Technician

## 2014-05-08 DIAGNOSIS — I5022 Chronic systolic (congestive) heart failure: Secondary | ICD-10-CM

## 2014-05-08 LAB — CBC WITH DIFFERENTIAL/PLATELET
Basophils Absolute: 0 10*3/uL (ref 0.0–0.1)
Basophils Relative: 0.2 % (ref 0.0–3.0)
Eosinophils Absolute: 0.1 10*3/uL (ref 0.0–0.7)
Eosinophils Relative: 2.1 % (ref 0.0–5.0)
HCT: 44.3 % (ref 39.0–52.0)
Hemoglobin: 14.9 g/dL (ref 13.0–17.0)
Lymphocytes Relative: 16.8 % (ref 12.0–46.0)
Lymphs Abs: 0.7 10*3/uL (ref 0.7–4.0)
MCHC: 33.7 g/dL (ref 30.0–36.0)
MCV: 89.4 fl (ref 78.0–100.0)
Monocytes Absolute: 0.4 10*3/uL (ref 0.1–1.0)
Monocytes Relative: 8.7 % (ref 3.0–12.0)
Neutro Abs: 3.1 10*3/uL (ref 1.4–7.7)
Neutrophils Relative %: 72.2 % (ref 43.0–77.0)
Platelets: 183 10*3/uL (ref 150.0–400.0)
RBC: 4.96 Mil/uL (ref 4.22–5.81)
RDW: 15.2 % (ref 11.5–15.5)
WBC: 4.3 10*3/uL (ref 4.0–10.5)

## 2014-05-08 LAB — PROTIME-INR
INR: 3 ratio — ABNORMAL HIGH (ref 0.8–1.0)
Prothrombin Time: 31.9 s — ABNORMAL HIGH (ref 9.6–13.1)

## 2014-05-09 ENCOUNTER — Ambulatory Visit: Payer: Commercial Managed Care - HMO | Admitting: Family

## 2014-05-09 ENCOUNTER — Ambulatory Visit: Payer: Commercial Managed Care - HMO

## 2014-05-09 DIAGNOSIS — I4891 Unspecified atrial fibrillation: Secondary | ICD-10-CM | POA: Diagnosis not present

## 2014-05-09 DIAGNOSIS — I4729 Other ventricular tachycardia: Secondary | ICD-10-CM | POA: Diagnosis not present

## 2014-05-09 DIAGNOSIS — G4733 Obstructive sleep apnea (adult) (pediatric): Secondary | ICD-10-CM | POA: Diagnosis not present

## 2014-05-09 DIAGNOSIS — I428 Other cardiomyopathies: Secondary | ICD-10-CM | POA: Diagnosis not present

## 2014-05-09 DIAGNOSIS — I472 Ventricular tachycardia: Secondary | ICD-10-CM | POA: Diagnosis not present

## 2014-05-09 DIAGNOSIS — Z7901 Long term (current) use of anticoagulants: Secondary | ICD-10-CM | POA: Diagnosis not present

## 2014-05-09 DIAGNOSIS — I5022 Chronic systolic (congestive) heart failure: Secondary | ICD-10-CM | POA: Diagnosis not present

## 2014-05-09 DIAGNOSIS — E119 Type 2 diabetes mellitus without complications: Secondary | ICD-10-CM | POA: Diagnosis not present

## 2014-05-09 DIAGNOSIS — E785 Hyperlipidemia, unspecified: Secondary | ICD-10-CM | POA: Diagnosis not present

## 2014-05-09 DIAGNOSIS — Z4502 Encounter for adjustment and management of automatic implantable cardiac defibrillator: Secondary | ICD-10-CM | POA: Diagnosis present

## 2014-05-09 LAB — BASIC METABOLIC PANEL
BUN: 16 mg/dL (ref 6–23)
CO2: 26 mEq/L (ref 19–32)
Calcium: 9.2 mg/dL (ref 8.4–10.5)
Chloride: 103 mEq/L (ref 96–112)
Creatinine, Ser: 0.9 mg/dL (ref 0.4–1.5)
GFR: 87.86 mL/min (ref >60.00)
Glucose, Bld: 126 mg/dL — ABNORMAL HIGH (ref 70–99)
Potassium: 4.4 mEq/L (ref 3.5–5.1)
Sodium: 137 mEq/L (ref 135–145)

## 2014-05-09 MED ORDER — DEXTROSE 5 % IV SOLN
3.0000 g | INTRAVENOUS | Status: DC
  Filled 2014-05-09: qty 3000

## 2014-05-09 MED ORDER — SODIUM CHLORIDE 0.9 % IR SOLN
80.0000 mg | Status: DC
  Filled 2014-05-09: qty 2

## 2014-05-10 ENCOUNTER — Encounter (HOSPITAL_COMMUNITY)
Admission: RE | Disposition: A | Discharge status: Home / Self Care | Payer: Self-pay | Source: Ambulatory Visit | Attending: Internal Medicine

## 2014-05-10 ENCOUNTER — Ambulatory Visit (HOSPITAL_COMMUNITY)
Admission: RE | Admit: 2014-05-10 | Discharge: 2014-05-10 | Disposition: A | Discharge status: Home / Self Care | Payer: Medicare HMO | Source: Ambulatory Visit | Attending: Internal Medicine | Admitting: Internal Medicine

## 2014-05-10 DIAGNOSIS — I5022 Chronic systolic (congestive) heart failure: Secondary | ICD-10-CM | POA: Diagnosis not present

## 2014-05-10 DIAGNOSIS — Z4502 Encounter for adjustment and management of automatic implantable cardiac defibrillator: Secondary | ICD-10-CM | POA: Diagnosis not present

## 2014-05-10 DIAGNOSIS — E785 Hyperlipidemia, unspecified: Secondary | ICD-10-CM | POA: Insufficient documentation

## 2014-05-10 DIAGNOSIS — Z7901 Long term (current) use of anticoagulants: Secondary | ICD-10-CM | POA: Insufficient documentation

## 2014-05-10 DIAGNOSIS — I472 Ventricular tachycardia, unspecified: Secondary | ICD-10-CM | POA: Insufficient documentation

## 2014-05-10 DIAGNOSIS — I4729 Other ventricular tachycardia: Secondary | ICD-10-CM

## 2014-05-10 DIAGNOSIS — G4733 Obstructive sleep apnea (adult) (pediatric): Secondary | ICD-10-CM | POA: Insufficient documentation

## 2014-05-10 DIAGNOSIS — I4891 Unspecified atrial fibrillation: Secondary | ICD-10-CM | POA: Insufficient documentation

## 2014-05-10 DIAGNOSIS — I428 Other cardiomyopathies: Secondary | ICD-10-CM | POA: Insufficient documentation

## 2014-05-10 DIAGNOSIS — E119 Type 2 diabetes mellitus without complications: Secondary | ICD-10-CM | POA: Insufficient documentation

## 2014-05-10 HISTORY — PX: IMPLANTABLE CARDIOVERTER DEFIBRILLATOR (ICD) GENERATOR CHANGE: SHX5469

## 2014-05-10 LAB — PROTIME-INR
INR: 2.55 — ABNORMAL HIGH (ref 0.00–1.49)
Prothrombin Time: 27.4 seconds — ABNORMAL HIGH (ref 11.6–15.2)

## 2014-05-10 LAB — GLUCOSE, CAPILLARY: Glucose-Capillary: 111 mg/dL — ABNORMAL HIGH (ref 70–99)

## 2014-05-10 LAB — SURGICAL PCR SCREEN
MRSA, PCR: NEGATIVE
Staphylococcus aureus: POSITIVE — AB

## 2014-05-10 SURGERY — ICD GENERATOR CHANGE
Anesthesia: LOCAL

## 2014-05-10 MED ORDER — SODIUM CHLORIDE 0.9 % IV SOLN
INTRAVENOUS | Status: DC
  Administered 2014-05-10: 08:00:00 via INTRAVENOUS

## 2014-05-10 MED ORDER — LIDOCAINE HCL (PF) 1 % IJ SOLN
INTRAMUSCULAR | Status: AC
Start: 2014-05-10 — End: 2014-05-10
  Filled 2014-05-10: qty 60

## 2014-05-10 MED ORDER — MIDAZOLAM HCL 5 MG/5ML IJ SOLN
INTRAMUSCULAR | Status: AC
  Filled 2014-05-10: qty 5

## 2014-05-10 MED ORDER — MUPIROCIN 2 % EX OINT
TOPICAL_OINTMENT | CUTANEOUS | Status: AC
  Filled 2014-05-10: qty 22

## 2014-05-10 MED ORDER — FENTANYL CITRATE 0.05 MG/ML IJ SOLN
INTRAMUSCULAR | Status: AC
  Filled 2014-05-10: qty 2

## 2014-05-10 MED ORDER — MUPIROCIN 2 % EX OINT
TOPICAL_OINTMENT | Freq: Two times a day (BID) | CUTANEOUS | Status: DC
  Administered 2014-05-10: 08:00:00 via NASAL
  Filled 2014-05-10: qty 22

## 2014-05-10 MED ORDER — ACETAMINOPHEN 325 MG PO TABS: 325.0000 mg | ORAL_TABLET | ORAL | Status: DC | PRN

## 2014-05-10 MED ORDER — ONDANSETRON HCL 4 MG/2ML IJ SOLN: 4.0000 mg | Freq: Four times a day (QID) | INTRAMUSCULAR | Status: DC | PRN

## 2014-05-10 MED ORDER — CHLORHEXIDINE GLUCONATE 4 % EX LIQD: 60.0000 mL | Freq: Once | CUTANEOUS | Status: DC

## 2014-05-10 NOTE — H&P (Signed)
  ICD Criteria  Current LVEF:55% ;Obtained > or = 1 month ago and < or = 3 months ago.  NYHA Functional Classification: Class II  Heart Failure History:  Yes, Duration of heart failure since onset is > 9 months  Non-Ischemic Dilated Cardiomyopathy History:  No.  Atrial Fibrillation/Atrial Flutter:  Yes, A-Fib/A-Flutter type: Persistent (>7 days).  Ventricular Tachycardia History:  Yes, Hemodynamic instability present, VT Type:  SVT - Monomorphic.  Cardiac Arrest History:  no  History of Syndromes with Risk of Sudden Death:  No.  Previous ICD: yes  Electrophysiology Study: No.  Prior MI: No.  PPM: No.  OSA:  Yes   Patient Life Expectancy of >=1 year: Yes.  Anticoagulation Therapy:  Patient is on anticoagulation therapy, anticoagulation was NOT held prior to procedure.   Beta Blocker Therapy:  Yes.   Ace Inhibitor/ARB Therapy:  Yes.

## 2014-05-10 NOTE — Discharge Instructions (Signed)
Pacemaker Battery Change, Care After Refer to this sheet in the next few weeks. These instructions provide you with information on caring for yourself after your procedure. Your health care provider may also give you more specific instructions. Your treatment has been planned according to current medical practices, but problems sometimes occur. Call your health care provider if you have any problems or questions after your procedure. WHAT TO EXPECT AFTER THE PROCEDURE After your procedure, it is typical to have the following sensations:  Soreness at the pacemaker site. HOME CARE INSTRUCTIONS   Keep the incision clean and dry.  Unless advised otherwise, you may shower beginning 48 hours after your procedure.  For the first week after the replacement, avoid stretching motions that pull at the incision site, and avoid heavy exercise with the arm that is on the same side as the incision.  Take medicines only as directed by your health care provider.  Keep all follow-up visits as directed by your health care provider. SEEK MEDICAL CARE IF:   You have pain at the incision site that is not relieved by over-the-counter or prescription medicine.  There is drainage or pus from the incision site.  There is swelling larger than a lime at the incision site.  You develop red streaking that extends above or below the incision site.  You feel brief, intermittent palpitations, light-headedness, or any symptoms that you feel might be related to your heart. SEEK IMMEDIATE MEDICAL CARE IF:   You experience chest pain that is different than the pain at the pacemaker site.  You experience shortness of breath.  You have palpitations or irregular heartbeat.  You have light-headedness that does not go away quickly.  You faint.  You have pain that gets worse and is not relieved by medicine. Document Released: 06/13/2013 Document Revised: 01/07/2014 Document Reviewed: 06/13/2013 [facility] Patient  Information 2015 [place], [place]. This information is not intended to replace advice given to you by your health care provider. Make sure you discuss any questions you have with your health care provider. Angiogram, Care After Refer to this sheet in the next few weeks. These instructions provide you with information on caring for yourself after your procedure. Your health care provider may also give you more specific instructions. Your treatment has been planned according to current medical practices, but problems sometimes occur. Call your health care provider if you have any problems or questions after your procedure.  WHAT TO EXPECT AFTER THE PROCEDURE After your procedure, it is typical to have the following sensations:  Minor discomfort or tenderness and a small bump at the catheter insertion site. The bump should usually decrease in size and tenderness within 1 to 2 weeks.  Any bruising will usually fade within 2 to 4 weeks. HOME CARE INSTRUCTIONS   You may need to keep taking blood thinners if they were prescribed for you. Take medicines only as directed by your health care provider.  Do not apply powder or lotion to the site.  Do not take baths, swim, or use a hot tub until your health care provider approves.  You may shower 24 hours after the procedure. Remove the bandage (dressing) and gently wash the site with plain soap and water. Gently pat the site dry.  Inspect the site at least twice daily.  Limit your activity for the first 48 hours. Do not bend, squat, or lift anything over 20 lb (9 kg) or as directed by your health care provider.  Plan to have someone take  you home after the procedure. Follow instructions about when you can drive or return to work. SEEK MEDICAL CARE IF:  You get light-headed when standing up.  You have drainage (other than a small amount of blood on the dressing).  You have chills.  You have a fever.  You have redness, warmth, swelling, or pain at  the insertion site. SEEK IMMEDIATE MEDICAL CARE IF:   You develop chest pain or shortness of breath, feel faint, or pass out.  You have bleeding, swelling larger than a walnut, or drainage from the catheter insertion site.  You develop pain, discoloration, coldness, or severe bruising in the leg or arm that held the catheter.  You develop bleeding from any other place, such as the bowels. You may see bright red blood in your urine or stools, or your stools may appear black and tarry.  You have heavy bleeding from the site. If this happens, hold pressure on the site. MAKE SURE YOU:  Understand these instructions.  Will watch your condition.  Will get help right away if you are not doing well or get worse. Document Released: 03/11/2005 Document Revised: 01/07/2014 Document Reviewed: 01/15/2013 [facility] Patient Information 2015 [place], [place]. This information is not intended to replace advice given to you by your health care provider. Make sure you discuss any questions you have with your health care provider.

## 2014-05-10 NOTE — Interval H&P Note (Signed)
History and Physical Interval Note:  05/10/2014 8:57 AM  Collin [name]  has presented today for surgery, with the diagnosis of battery depletion  The various methods of treatment have been discussed with the patient and family. After consideration of risks, benefits and other options for treatment, the patient has consented to  Procedure(s): ICD GENERATOR CHANGE (N/A) as a surgical intervention .  The patient's history has been reviewed, patient examined, no change in status, stable for surgery.  I have reviewed the patient's chart and labs.  Questions were answered to the patient's satisfaction.     Mikle Bosworth.D.

## 2014-05-10 NOTE — Progress Notes (Signed)
Pt PCR screen positive for staph/ pt instructed on how to do mupiricin ointment at home/ verbally and on pamphlet.  Demonstrated also.  Pt given ointment to take home.  Pt verbalizes understanding.

## 2014-05-10 NOTE — CV Procedure (Signed)
EP Procedure Note  Procedure: Removal of an old ICD which reached ERI, and insertion of a new ICD  Preoperative Dx: VT, non-ischemic CM with variable EF, chronic systolic heart failure, chronic atrial fib Postoperative Dx: same as pre-procedure  Description of the procedure: after informed consent was obtained, the patient was taken to the diagnostic EP lab in the fasting state. After the usual preparation and draping, IV versed and fentanyl was given for sedation. 30 cc of lidocaine was infiltrated into the left infraclavicular region. A 6 cm incision was carried out. Electrocautery was used to dissect down to the ICD pocket and electrocautery was used to assure hemostasis. The old Guidant ICD was removed. The new [facility] ICD, Serial [id number] was connected to the old ICD lead and placed in the subcutaneous pocket. The pocket was irrigated with anti-biotic irrigation. The incision was closed with 2 layers of vicryl suture. Benzoin and sterstrips were painted on the skin, and a dressing was placed and the patient was returned to his room in satisfactory condition.  Complications: none immediately  Conclusion: successful removal of an old Guidant ICD and insertion of a new ICD without immediate complication.  Mikle Bosworth.D.

## 2014-05-10 NOTE — H&P (View-Only) (Signed)
HPI [name] returns today for followup. He is a very pleasant 73 year old man with a nonischemic cardiomyopathy and chronic systolic heart failure, status post ICD implantation. He has reached elective replacement on his device. He has received no ICD shocks since he has had his device. Repeat 2D echo last year demonstrated an EF 35-40%. Previously it had been higher.  In the interim he has had no syncope. ICD interogation several months ago did demonstrate an episode of very fast VT in the VF zone which spontaneously terminated just prior to being shocked. He remains in atrial fib. Allergies  Allergen Reactions  . Niaspan [Niacin Er] Other (See Comments)    flushes     Current Outpatient Prescriptions  Medication Sig Dispense Refill  . carvedilol (COREG) 25 MG tablet TAKE 1 TABLET TWICE DAILY WITH MEALS  180 tablet  3  . digoxin (LANOXIN) 0.25 MG tablet TAKE 1 TABLET EVERY DAY  90 tablet  0  . metFORMIN (GLUCOPHAGE) 500 MG tablet TAKE 1 TABLET TWICE DAILY WITH MEALS  180 tablet  3  . pravastatin (PRAVACHOL) 40 MG tablet TAKE 1 TABLET EVERY EVENING  90 tablet  3  . ramipril (ALTACE) 5 MG capsule TAKE 1 CAPSULE DAILY  90 capsule  3  . TYLENOL 500 MG tablet Take 1,000 mg by mouth every 6 (six) hours as needed.       . valACYclovir (VALTREX) 1000 MG tablet Take 1 tablet (1,000 mg total) by mouth 3 (three) times daily.  21 tablet  0  . warfarin (COUMADIN) 5 MG tablet Take as directed by anticoagulation clinic  105 tablet  1   No current facility-administered medications for this visit.     Past Medical History  Diagnosis Date  . Diabetes mellitus   . Heart disease   . Hyperlipidemia   . Cardiomyopathy, nonischemic 1998  . Atrial fibrillation   . OSA (obstructive sleep apnea)     ROS:   All systems reviewed and negative except as noted in the HPI.   Past Surgical History  Procedure Laterality Date  . Cardiac defibrillator placement  2006     Family History  Problem  Relation Age of Onset  . Heart disease Mother   . Sudden death Mother     under age 22  . Lupus Mother   . Hypertension Father   . Stroke Father      History   Social History  . Marital Status: Married    Spouse Name: N/A    Number of Children: N/A  . Years of Education: N/A   Occupational History  . Not on file.   Social History Main Topics  . Smoking status: Never Smoker   . Smokeless tobacco: Not on file  . Alcohol Use: No  . Drug Use: No  . Sexual Activity: Not on file   Other Topics Concern  . Not on file   Social History Narrative  . No narrative on file     BP 131/81  Pulse 71  Ht 6\' 1"  (1.854 m)  Wt 283 lb 12.8 oz (128.731 kg)  BMI 37.45 kg/m2  Physical Exam:  Well appearing 73 year old man, NAD HEENT: Unremarkable Neck:  7 cm JVD, no thyromegally Lungs:  Clear with no wheezes, rales, or rhonchi. HEART:  IRegular rate rhythm, no murmurs, no rubs, no clicks Abd:  soft, positive bowel sounds, no organomegally, no rebound, no guarding Ext:  2 plus pulses, no edema, no cyanosis, no clubbing Skin:  No  rashes no nodules Neuro:  CN II through XII intact, motor grossly intact   DEVICE  Normal device function.  See PaceArt for details.  Device at elective replacement Assess/Plan:

## 2014-05-11 ENCOUNTER — Other Ambulatory Visit: Payer: Self-pay | Admitting: Family Medicine

## 2014-05-28 ENCOUNTER — Telehealth: Payer: Self-pay | Admitting: *Deleted

## 2014-05-28 NOTE — Telephone Encounter (Signed)
Pt's spouse states their Latitude has doctor icon illuminated. After trouble shooting without success, I gave them tech svcs number to discern if unit needs replacing.

## 2014-06-08 ENCOUNTER — Other Ambulatory Visit: Payer: Self-pay | Admitting: Family Medicine

## 2014-06-10 ENCOUNTER — Ambulatory Visit (INDEPENDENT_AMBULATORY_CARE_PROVIDER_SITE_OTHER)
Admission: RE | Admit: 2014-06-10 | Discharge: 2014-06-10 | Disposition: A | Discharge status: Home / Self Care | Payer: Commercial Managed Care - HMO | Source: Ambulatory Visit | Attending: Family Medicine | Admitting: Family Medicine

## 2014-06-10 ENCOUNTER — Telehealth: Payer: Self-pay | Admitting: Family

## 2014-06-10 ENCOUNTER — Ambulatory Visit (INDEPENDENT_AMBULATORY_CARE_PROVIDER_SITE_OTHER): Payer: Commercial Managed Care - HMO | Admitting: Family

## 2014-06-10 ENCOUNTER — Encounter: Payer: Self-pay | Admitting: Family Medicine

## 2014-06-10 ENCOUNTER — Ambulatory Visit (INDEPENDENT_AMBULATORY_CARE_PROVIDER_SITE_OTHER): Payer: Commercial Managed Care - HMO | Admitting: Family Medicine

## 2014-06-10 VITALS — BP 130/80 | HR 70 | Temp 97.9°F | Wt 281.0 lb

## 2014-06-10 DIAGNOSIS — R112 Nausea with vomiting, unspecified: Secondary | ICD-10-CM

## 2014-06-10 DIAGNOSIS — K429 Umbilical hernia without obstruction or gangrene: Secondary | ICD-10-CM

## 2014-06-10 DIAGNOSIS — K5900 Constipation, unspecified: Secondary | ICD-10-CM

## 2014-06-10 DIAGNOSIS — I4891 Unspecified atrial fibrillation: Secondary | ICD-10-CM

## 2014-06-10 DIAGNOSIS — Z5181 Encounter for therapeutic drug level monitoring: Secondary | ICD-10-CM

## 2014-06-10 LAB — HEPATIC FUNCTION PANEL
ALT: 25 U/L (ref 0–53)
AST: 22 U/L (ref 0–37)
Albumin: 4.4 g/dL (ref 3.5–5.2)
Alkaline Phosphatase: 85 U/L (ref 39–117)
Bilirubin, Direct: 0.2 mg/dL (ref 0.0–0.3)
Total Bilirubin: 1.1 mg/dL (ref 0.2–1.2)
Total Protein: 8.7 g/dL — ABNORMAL HIGH (ref 6.0–8.3)

## 2014-06-10 LAB — POCT INR
INR: 1.8
INR: 1.8

## 2014-06-10 LAB — BASIC METABOLIC PANEL
BUN: 19 mg/dL (ref 6–23)
CO2: 28 mEq/L (ref 19–32)
Calcium: 9.4 mg/dL (ref 8.4–10.5)
Chloride: 96 mEq/L (ref 96–112)
Creatinine, Ser: 1 mg/dL (ref 0.4–1.5)
GFR: 81.53 mL/min (ref >60.00)
Glucose, Bld: 139 mg/dL — ABNORMAL HIGH (ref 70–99)
Potassium: 3.9 mEq/L (ref 3.5–5.1)
Sodium: 135 mEq/L (ref 135–145)

## 2014-06-10 LAB — CBC WITH DIFFERENTIAL/PLATELET
Basophils Absolute: 0 10*3/uL (ref 0.0–0.1)
Basophils Relative: 0.4 % (ref 0.0–3.0)
Eosinophils Absolute: 0.1 10*3/uL (ref 0.0–0.7)
Eosinophils Relative: 1 % (ref 0.0–5.0)
HCT: 50.4 % (ref 39.0–52.0)
Hemoglobin: 16.9 g/dL (ref 13.0–17.0)
Lymphocytes Relative: 11 % — ABNORMAL LOW (ref 12.0–46.0)
Lymphs Abs: 0.7 10*3/uL (ref 0.7–4.0)
MCHC: 33.6 g/dL (ref 30.0–36.0)
MCV: 90.9 fl (ref 78.0–100.0)
Monocytes Absolute: 0.6 10*3/uL (ref 0.1–1.0)
Monocytes Relative: 10.7 % (ref 3.0–12.0)
Neutro Abs: 4.6 10*3/uL (ref 1.4–7.7)
Neutrophils Relative %: 76.9 % (ref 43.0–77.0)
Platelets: 220 10*3/uL (ref 150.0–400.0)
RBC: 5.54 Mil/uL (ref 4.22–5.81)
RDW: 16.1 % — ABNORMAL HIGH (ref 11.5–15.5)
WBC: 6 10*3/uL (ref 4.0–10.5)

## 2014-06-10 LAB — LIPASE: Lipase: 24 U/L (ref 11.0–59.0)

## 2014-06-10 MED ORDER — ONDANSETRON 8 MG PO TBDP: 8.0000 mg | ORAL_TABLET | Freq: Three times a day (TID) | ORAL | Status: DC | PRN

## 2014-06-10 NOTE — Patient Instructions (Addendum)
Continue to take 1 tablet all days except 1 1/2 tablet on Monday.  Re-check 3 weeks.   Anticoagulation Dose Instructions as of 06/10/2014     Dorene Grebe Tue Wed Thu Fri Sat   New Dose 5 mg 7.5 mg 5 mg 5 mg 5 mg 5 mg 5 mg    Description       Continue to take 1 tablet all days except 1 1/2 tablet on Monday.  Re-check 3 weeks.

## 2014-06-10 NOTE — Addendum Note (Signed)
Addended by: Eulas Post on: 06/10/2014 05:41 PM   Modules accepted: Orders

## 2014-06-10 NOTE — Progress Notes (Addendum)
Subjective:    Patient ID: Collin [name], male    DOB: 1941-04-23, 73 y.o.   MRN: [id number]  Emesis  Pertinent negatives include no abdominal pain, chest pain, chills, dizziness or fever.   Acute visit for vomiting. Onset yesterday. About 3 episodes of vomiting since then. Green bilious emesis. No hematemesis. Patient has underlying history of constipation for at least a year. He still has bowel movements and they're very small pellet-like stools. Most recent bowel movement was the day before yesterday.  He denies any localizing abdominal pain. He has felt more distended. He has umbilical hernia which seems to be pushed out more but has been soft. He has not had any localized tenderness around this region. He denies any chest pains. No fevers or chills. No dysuria.  Kept down dinner last night but no food today. No history of small bowel obstruction.  Chronic problems include history of obesity, type 2 diabetes, CHF, heart failure, atrial fibrillation on chronic Coumadin, obstructive sleep apnea.  Past Medical History  Diagnosis Date  . Diabetes mellitus   . Heart disease   . Hyperlipidemia   . Cardiomyopathy, nonischemic 1998  . Atrial fibrillation   . OSA (obstructive sleep apnea)    Past Surgical History  Procedure Laterality Date  . Cardiac defibrillator placement  2006    reports that he has never smoked. He does not have any smokeless tobacco history on file. He reports that he does not drink alcohol or use illicit drugs. family history includes Heart disease in his mother; Hypertension in his father; Lupus in his mother; Stroke in his father; Sudden death in his mother. Allergies  Allergen Reactions  . Niaspan [Niacin Er] Other (See Comments)    flushes      Review of Systems  Constitutional: Positive for fatigue. Negative for fever and chills.  HENT: Negative for trouble swallowing.   Respiratory: Negative for shortness of breath.   Cardiovascular: Negative for  chest pain, palpitations and leg swelling.  Gastrointestinal: Positive for nausea, vomiting, constipation and abdominal distention. Negative for abdominal pain.  Endocrine: Negative for polydipsia and polyuria.  Genitourinary: Negative for dysuria.  Neurological: Negative for dizziness.  Psychiatric/Behavioral: Negative for confusion.       Objective:   Physical Exam  Constitutional: He appears well-developed and well-nourished.  Cardiovascular: Normal rate and regular rhythm.   Pulmonary/Chest: Effort normal and breath sounds normal. No respiratory distress. He has no wheezes. He has no rales.  Abdominal: Soft. He exhibits distension. He exhibits no mass. There is no tenderness. There is no rebound and no guarding.  He has prominent umbilical hernia which is soft and nontender. He does appear somewhat distended but has somewhat hyperactive bowel sounds. No localizing tenderness. No guarding.  Genitourinary:  . He does have some firm stool in rectal vault but no impaction. No mass          Assessment & Plan:  Patient has long history of constipation and presents with acute vomiting since last night. He does not have any evidence clinically to suggest small bowel obstruction with good bowel sounds. No pain to suggest likely pancreatitis. In a diabetic consider gallstones but again he has no associated pain. He has prominent umbilical hernia but this is soft and easily reducible no evidence for strangulation. Start with labs with comprehensive metabolic panel, CBC, lipase. Plain film of abdomen. Consider ultrasound abdomen to further assess. Zofran 8 mg every 8 hours as needed for nausea and vomiting. Followup promptly for  any abdominal pain, persistent vomiting, fever, or other changes  Pt eating taking clear liquids.  No vomiting. Appetite improving.

## 2014-06-10 NOTE — Telephone Encounter (Signed)
Pt's wife aware and appt scheduled

## 2014-06-10 NOTE — Patient Instructions (Signed)
Constipation  Constipation is when a person has fewer than three bowel movements a week, has difficulty having a bowel movement, or has stools that are dry, hard, or larger than normal. As people grow older, constipation is more common. If you try to fix constipation with medicines that make you have a bowel movement (laxatives), the problem may get worse. Long-term laxative use may cause the muscles of the colon to become weak. A low-fiber diet, not taking in enough fluids, and taking certain medicines may make constipation worse.   CAUSES   · Certain medicines, such as antidepressants, pain medicine, iron supplements, antacids, and water pills.    · Certain diseases, such as diabetes, irritable bowel syndrome (IBS), thyroid disease, or depression.    · Not drinking enough water.    · Not eating enough fiber-rich foods.    · Stress or travel.    · Lack of physical activity or exercise.    · Ignoring the urge to have a bowel movement.    · Using laxatives too much.    SIGNS AND SYMPTOMS   · Having fewer than three bowel movements a week.    · Straining to have a bowel movement.    · Having stools that are hard, dry, or larger than normal.    · Feeling full or bloated.    · Pain in the lower abdomen.    · Not feeling relief after having a bowel movement.    DIAGNOSIS   Your health care provider will take a medical history and perform a physical exam. Further testing may be done for severe constipation. Some tests may include:  · A barium enema X-ray to examine your rectum, colon, and, sometimes, your small intestine.    · A sigmoidoscopy to examine your lower colon.    · A colonoscopy to examine your entire colon.  TREATMENT   Treatment will depend on the severity of your constipation and what is causing it. Some dietary treatments include drinking more fluids and eating more fiber-rich foods. Lifestyle treatments may include regular exercise. If these diet and lifestyle recommendations do not help, your health care  provider may recommend taking over-the-counter laxative medicines to help you have bowel movements. Prescription medicines may be prescribed if over-the-counter medicines do not work.   HOME CARE INSTRUCTIONS   · Eat foods that have a lot of fiber, such as fruits, vegetables, whole grains, and beans.  · Limit foods high in fat and processed sugars, such as french fries, hamburgers, cookies, candies, and soda.    · A fiber supplement may be added to your diet if you cannot get enough fiber from foods.    · Drink enough fluids to keep your urine clear or pale yellow.    · Exercise regularly or as directed by your health care provider.    · Go to the restroom when you have the urge to go. Do not hold it.    · Only take over-the-counter or prescription medicines as directed by your health care provider. Do not take other medicines for constipation without talking to your health care provider first.    SEEK IMMEDIATE MEDICAL CARE IF:   · You have bright red blood in your stool.    · Your constipation lasts for more than 4 days or gets worse.    · You have abdominal or rectal pain.    · You have thin, pencil-like stools.    · You have unexplained weight loss.  MAKE SURE YOU:   · Understand these instructions.  · Will watch your condition.  · Will get help right away if you are not   you have with your health care provider.  Follow up for any fever, abdominal pain, or persistent vomiting.

## 2014-06-10 NOTE — Telephone Encounter (Signed)
Coumadin: Continue to take 1 tablet all days except 1 1/2 tablet on Monday.  Re-check 3 weeks.

## 2014-06-10 NOTE — Progress Notes (Signed)
Pre visit review using our clinic review tool, if applicable. No additional management support is needed unless otherwise documented below in the visit note. 

## 2014-07-01 ENCOUNTER — Ambulatory Visit: Payer: Commercial Managed Care - HMO | Admitting: Family

## 2014-07-04 ENCOUNTER — Ambulatory Visit (INDEPENDENT_AMBULATORY_CARE_PROVIDER_SITE_OTHER): Payer: Medicare HMO | Admitting: Family

## 2014-07-04 ENCOUNTER — Ambulatory Visit (INDEPENDENT_AMBULATORY_CARE_PROVIDER_SITE_OTHER): Payer: Medicare HMO

## 2014-07-04 DIAGNOSIS — Z5181 Encounter for therapeutic drug level monitoring: Secondary | ICD-10-CM

## 2014-07-04 DIAGNOSIS — I4819 Other persistent atrial fibrillation: Secondary | ICD-10-CM

## 2014-07-04 DIAGNOSIS — Z23 Encounter for immunization: Secondary | ICD-10-CM

## 2014-07-04 DIAGNOSIS — I481 Persistent atrial fibrillation: Secondary | ICD-10-CM

## 2014-07-04 LAB — POCT INR: INR: 2.9

## 2014-07-04 NOTE — Patient Instructions (Signed)
Continue to take 1 tablet all days except 1 1/2 tablet on Monday.  Re-check 4 weeks.   Anticoagulation Dose Instructions as of 07/04/2014     Dorene Grebe Tue Wed Thu Fri Sat   New Dose 5 mg 7.5 mg 5 mg 5 mg 5 mg 5 mg 5 mg    Description       Continue to take 1 tablet all days except 1 1/2 tablet on Monday.  Re-check 4 weeks.

## 2014-07-15 ENCOUNTER — Other Ambulatory Visit: Payer: Self-pay | Admitting: Family Medicine

## 2014-07-17 ENCOUNTER — Encounter: Payer: Self-pay | Admitting: *Deleted

## 2014-07-22 ENCOUNTER — Other Ambulatory Visit: Payer: Self-pay | Admitting: Internal Medicine

## 2014-07-31 ENCOUNTER — Ambulatory Visit (INDEPENDENT_AMBULATORY_CARE_PROVIDER_SITE_OTHER): Payer: Commercial Managed Care - HMO | Admitting: Family

## 2014-07-31 DIAGNOSIS — I4819 Other persistent atrial fibrillation: Secondary | ICD-10-CM

## 2014-07-31 DIAGNOSIS — I481 Persistent atrial fibrillation: Secondary | ICD-10-CM

## 2014-07-31 DIAGNOSIS — Z5181 Encounter for therapeutic drug level monitoring: Secondary | ICD-10-CM

## 2014-07-31 LAB — POCT INR: INR: 2.9

## 2014-07-31 NOTE — Patient Instructions (Signed)
Continue to take 1 tablet all days except 1 1/2 tablet on Monday.  Re-check 4 weeks.   Anticoagulation Dose Instructions as of 07/31/2014      Dorene Grebe Tue Wed Thu Fri Sat   New Dose 5 mg 7.5 mg 5 mg 5 mg 5 mg 5 mg 5 mg    Description        Continue to take 1 tablet all days except 1 1/2 tablet on Monday.  Re-check 4 weeks.

## 2014-08-15 ENCOUNTER — Encounter (HOSPITAL_COMMUNITY): Payer: Self-pay | Admitting: Internal Medicine

## 2014-08-22 ENCOUNTER — Ambulatory Visit (INDEPENDENT_AMBULATORY_CARE_PROVIDER_SITE_OTHER): Payer: Commercial Managed Care - HMO | Admitting: *Deleted

## 2014-08-22 DIAGNOSIS — I5022 Chronic systolic (congestive) heart failure: Secondary | ICD-10-CM

## 2014-08-22 LAB — MDC_IDC_ENUM_SESS_TYPE_INCLINIC
Battery Remaining Longevity: 144 mo
Date Time Interrogation Session: 20151217050000
HighPow Impedance: 46 Ohm
Implantable Pulse Generator Serial Number: 191056
Lead Channel Impedance Value: 480 Ohm
Lead Channel Pacing Threshold Amplitude: 0.7 V
Lead Channel Pacing Threshold Pulse Width: 0.4 ms
Lead Channel Sensing Intrinsic Amplitude: 15.5 mV
Lead Channel Setting Pacing Amplitude: 2.4 V
Lead Channel Setting Pacing Pulse Width: 0.4 ms
Lead Channel Setting Sensing Sensitivity: 0.6 mV
Zone Setting Detection Interval: 300 ms

## 2014-08-22 NOTE — Progress Notes (Signed)
ICD check in clinic. Normal device function. Thresholds and sensing consistent with previous device measurements. Impedance trends stable over time. No evidence of any ventricular arrhythmias. A-fib, + coumadin. Histogram distribution appropriate for patient and level of activity. No changes made this session. Device programmed at appropriate safety margins. Device programmed to optimize intrinsic conduction. Estimated longevity 12 years. Pt enrolled in remote follow-up. Plan to check device every 3 months remotely and in office annually. Patient education completed including shock plan. Alert tones/vibration demonstrated for patient.  [place]itude 11/21/14.

## 2014-08-28 ENCOUNTER — Ambulatory Visit (INDEPENDENT_AMBULATORY_CARE_PROVIDER_SITE_OTHER): Payer: Commercial Managed Care - HMO | Admitting: Family

## 2014-08-28 DIAGNOSIS — I482 Chronic atrial fibrillation, unspecified: Secondary | ICD-10-CM

## 2014-08-28 DIAGNOSIS — Z5181 Encounter for therapeutic drug level monitoring: Secondary | ICD-10-CM

## 2014-08-28 LAB — POCT INR: INR: 2.5

## 2014-08-28 NOTE — Patient Instructions (Signed)
Continue to take 1 tablet all days except 1 1/2 tablet on Monday.  Re-check 6 weeks.   Anticoagulation Dose Instructions as of 08/28/2014      Dorene Grebe Tue Wed Thu Fri Sat   New Dose 5 mg 7.5 mg 5 mg 5 mg 5 mg 5 mg 5 mg    Description        Continue to take 1 tablet all days except 1 1/2 tablet on Monday.  Re-check 6 weeks.

## 2014-09-10 ENCOUNTER — Encounter: Payer: Self-pay | Admitting: Internal Medicine

## 2014-09-19 NOTE — Progress Notes (Signed)
HPI    Cardiology Office Note   Date:  09/20/2014   ID:  Collin [name], DOB [birth year], MRN [id number]  PCP:  [name], MD       History of Present Illness:  Patient iNICM, s/p ICD. Also a history of afib, DM, HL and sleep apnea I saw him in AUg 2014.  He is also followd by [name]  Had echo done r to reeval LVEF.  LVEF was 35 to 40%  (8.2014 I saw the patinet in May 2015  She was seen by [name] in the fall    His previous ICD was end of life and he had it replaced in the fall    Since seen his breathing is OK  No CP  No singif palpitations.   Remembers having syncope on aldactone Still with edema in feet.  Eats some salt      Past Medical History  Diagnosis Date  . Diabetes mellitus   . Heart disease   . Hyperlipidemia   . Cardiomyopathy, nonischemic 1998  . Atrial fibrillation   . OSA (obstructive sleep apnea)     Past Surgical History  Procedure Laterality Date  . Cardiac defibrillator placement  2006  . Implantable cardioverter defibrillator (icd) generator change N/A 05/10/2014    Procedure: ICD GENERATOR CHANGE;  Surgeon: [name], MD;  Location: [facility] CATH LAB;  Service: Cardiovascular;  Laterality: N/A;     Current Outpatient Prescriptions  Medication Sig Dispense Refill  . acetaminophen (TYLENOL) 500 MG tablet Take 1,000 mg by mouth every 6 (six) hours as needed for moderate pain.    . carvedilol (COREG) 25 MG tablet TAKE 1 TABLET TWICE DAILY WITH MEALS 180 tablet 3  . digoxin (LANOXIN) 0.25 MG tablet TAKE 1 TABLET EVERY DAY. (PATIENT NEEDS TO SCHEDULE APPOINTMENT FOR FURTHER REFILLS) 90 tablet 0  . metFORMIN (GLUCOPHAGE) 500 MG tablet Take 500 mg by mouth 2 (two) times daily with a meal.    . pravastatin (PRAVACHOL) 40 MG tablet Take 40 mg by mouth daily.    . ramipril (ALTACE) 5 MG capsule TAKE 1 CAPSULE DAILY 90 capsule 2  . warfarin (COUMADIN) 5 MG tablet TAKE AS DIRECTED BY ANTICOAGULATION CLINIC 105 tablet 1   No current  facility-administered medications for this visit.    Allergies:   Niaspan    Social History:  The patient  reports that he has never smoked. He does not have any smokeless tobacco history on file. He reports that he does not drink alcohol or use illicit drugs.   Family History:  The patient's family history includes Heart disease in his mother; Hypertension in his father; Lupus in his mother; Stroke in his father; Sudden death in his mother.    ROS:  All  other systems are reviewed,  Negative to the above problem excpet as noted     PHYSICAL EXAM: VS:  BP 120/86 mmHg  Pulse 62  Ht 6\' 1"  (1.854 m)  Wt 292 lb (132.45 kg)  BMI 38.53 kg/m2 , BMI Body mass index is 38.53 kg/(m^2). GEN: Well nourished, well developed, in no acute distress HEENT: normal Neck: no JVD, carotid bruits, or masses Cardiac: RRR; no murmurs, rubs, or gallops,1-2+ edema feet Respiratory:  clear to auscultation bilaterally, normal work of breathing GI: soft, nontender, nondistended, + BS MS: no deformity or atrophy Skin: warm and dry, no rash Neuro:  Strength and sensation are intact Psych: euthymic mood, full affect     Recent Labs:  01/11/2014: Pro B Natriuretic peptide (BNP) 119.0* 06/10/2014: ALT 25; BUN 19; Creatinine 1.0; Hemoglobin 16.9; Platelets 220.0; Potassium 3.9; Sodium 135    Lipid Panel    Component Value Date/Time   CHOL 165 07/25/2013 0859   TRIG 255.0* 07/25/2013 0859   HDL 33.90* 07/25/2013 0859   CHOLHDL 5 07/25/2013 0859   VLDL 51.0* 07/25/2013 0859   LDLCALC 95 05/21/2013 1016   LDLDIRECT 95.5 07/25/2013 0859      Wt Readings from Last 3 Encounters:  09/20/14 292 lb (132.45 kg)  06/10/14 281 lb (127.461 kg)  05/10/14 285 lb (129.275 kg)        ASSESSMENT AND PLAN:  1.  Atirial fib  Denies palpitations.  Keep on same reigmen  2.  Nonischemic cardiomyopathy  Volume is up some with edema in legs  Admits to eating some salt.  I think he can take lasix a couple times per  week  Eat banana Check labs  I won't push meds furhter given syncope  3  HL  Check lpids    Signed, Dietrich Pates, MD  09/20/2014 10:52 PM    [facility] [address] [place], [place], [place]  [address] Phone: [phone]; Fax: [phone]

## 2014-09-20 ENCOUNTER — Encounter: Payer: Self-pay | Admitting: Internal Medicine

## 2014-09-20 ENCOUNTER — Ambulatory Visit (INDEPENDENT_AMBULATORY_CARE_PROVIDER_SITE_OTHER): Payer: Commercial Managed Care - HMO | Admitting: Internal Medicine

## 2014-09-20 VITALS — BP 120/86 | HR 62 | Ht 73.0 in | Wt 292.0 lb

## 2014-09-20 DIAGNOSIS — I5022 Chronic systolic (congestive) heart failure: Secondary | ICD-10-CM

## 2014-09-20 DIAGNOSIS — I4819 Other persistent atrial fibrillation: Secondary | ICD-10-CM

## 2014-09-20 DIAGNOSIS — I481 Persistent atrial fibrillation: Secondary | ICD-10-CM | POA: Diagnosis not present

## 2014-09-20 NOTE — Patient Instructions (Signed)
Your physician recommends that you continue on your current medications as directed. Please refer to the Current Medication list given to you today.  Your physician wants you to follow-up in: 10 months. You will receive a reminder letter in the mail two months in advance. If you don't receive a letter, please call our office to schedule the follow-up appointment.  When you have lab work in a few weeks please add a BMET, CBCD, BNP and Lipids (Remember not to eat or drink after midnight the night before labs are drawn) and fax to Dr Harrington Challenger at [phone].

## 2014-10-04 ENCOUNTER — Ambulatory Visit (INDEPENDENT_AMBULATORY_CARE_PROVIDER_SITE_OTHER): Payer: Commercial Managed Care - HMO | Admitting: Family Medicine

## 2014-10-04 ENCOUNTER — Encounter: Payer: Self-pay | Admitting: Internal Medicine

## 2014-10-04 ENCOUNTER — Encounter: Payer: Self-pay | Admitting: Family Medicine

## 2014-10-04 VITALS — BP 120/76 | HR 76 | Temp 97.2°F | Wt 290.0 lb

## 2014-10-04 DIAGNOSIS — E119 Type 2 diabetes mellitus without complications: Secondary | ICD-10-CM

## 2014-10-04 DIAGNOSIS — I4891 Unspecified atrial fibrillation: Secondary | ICD-10-CM | POA: Diagnosis not present

## 2014-10-04 DIAGNOSIS — I5022 Chronic systolic (congestive) heart failure: Secondary | ICD-10-CM | POA: Diagnosis not present

## 2014-10-04 DIAGNOSIS — L989 Disorder of the skin and subcutaneous tissue, unspecified: Secondary | ICD-10-CM

## 2014-10-04 DIAGNOSIS — I428 Other cardiomyopathies: Secondary | ICD-10-CM

## 2014-10-04 DIAGNOSIS — E785 Hyperlipidemia, unspecified: Secondary | ICD-10-CM | POA: Diagnosis not present

## 2014-10-04 DIAGNOSIS — I429 Cardiomyopathy, unspecified: Secondary | ICD-10-CM

## 2014-10-04 LAB — POCT INR: INR: 2.7

## 2014-10-04 LAB — HEPATIC FUNCTION PANEL
ALT: 25 U/L (ref 0–53)
AST: 20 U/L (ref 0–37)
Albumin: 4.1 g/dL (ref 3.5–5.2)
Alkaline Phosphatase: 82 U/L (ref 39–117)
Bilirubin, Direct: 0.1 mg/dL (ref 0.0–0.3)
Total Bilirubin: 0.6 mg/dL (ref 0.2–1.2)
Total Protein: 7.4 g/dL (ref 6.0–8.3)

## 2014-10-04 LAB — LIPID PANEL
Cholesterol: 166 mg/dL (ref 0–200)
HDL: 35.9 mg/dL — ABNORMAL LOW (ref >39.00)
NonHDL: 130.1
Total CHOL/HDL Ratio: 5
Triglycerides: 365 mg/dL — ABNORMAL HIGH (ref 0.0–149.0)
VLDL: 73 mg/dL — ABNORMAL HIGH (ref 0.0–40.0)

## 2014-10-04 LAB — BASIC METABOLIC PANEL
BUN: 19 mg/dL (ref 6–23)
CO2: 27 mEq/L (ref 19–32)
Calcium: 9.2 mg/dL (ref 8.4–10.5)
Chloride: 104 mEq/L (ref 96–112)
Creatinine, Ser: 0.92 mg/dL (ref 0.40–1.50)
GFR: 85.56 mL/min (ref >60.00)
Glucose, Bld: 126 mg/dL — ABNORMAL HIGH (ref 70–99)
Potassium: 4.4 mEq/L (ref 3.5–5.1)
Sodium: 139 mEq/L (ref 135–145)

## 2014-10-04 LAB — CBC WITH DIFFERENTIAL/PLATELET
Basophils Absolute: 0 10*3/uL (ref 0.0–0.1)
Basophils Relative: 0.4 % (ref 0.0–3.0)
Eosinophils Absolute: 0.1 10*3/uL (ref 0.0–0.7)
Eosinophils Relative: 1.8 % (ref 0.0–5.0)
HCT: 45.5 % (ref 39.0–52.0)
Hemoglobin: 15.6 g/dL (ref 13.0–17.0)
Lymphocytes Relative: 19.4 % (ref 12.0–46.0)
Lymphs Abs: 0.8 10*3/uL (ref 0.7–4.0)
MCHC: 34.2 g/dL (ref 30.0–36.0)
MCV: 88.6 fl (ref 78.0–100.0)
Monocytes Absolute: 0.4 10*3/uL (ref 0.1–1.0)
Monocytes Relative: 9.2 % (ref 3.0–12.0)
Neutro Abs: 2.8 10*3/uL (ref 1.4–7.7)
Neutrophils Relative %: 69.2 % (ref 43.0–77.0)
Platelets: 200 10*3/uL (ref 150.0–400.0)
RBC: 5.14 Mil/uL (ref 4.22–5.81)
RDW: 14.2 % (ref 11.5–15.5)
WBC: 4.1 10*3/uL (ref 4.0–10.5)

## 2014-10-04 LAB — LDL CHOLESTEROL, DIRECT: Direct LDL: 79 mg/dL

## 2014-10-04 LAB — HEMOGLOBIN A1C: Hgb A1c MFr Bld: 7.1 % — ABNORMAL HIGH (ref 4.6–6.5)

## 2014-10-04 LAB — BRAIN NATRIURETIC PEPTIDE: Pro B Natriuretic peptide (BNP): 108 pg/mL — ABNORMAL HIGH (ref 0.0–100.0)

## 2014-10-04 NOTE — Progress Notes (Signed)
Subjective:    Patient ID: Collin [name], male    DOB: 1941/01/13, 74 y.o.   MRN: [id number]  HPI Here for follow-up multiple issues. He has history of morbid obesity, obstructive sleep apnea, type 2 diabetes, hyperlipidemia, nonischemic cardiomyopathy, hyperlipidemia, atrial fibrillation, history of chronic systolic heart failure. He states he is claustrophobic and does not use CPAP for obstructive sleep apnea. Medications reviewed. No recent changes. No side effects. He has some chronic leg edema. Poor compliance with diet. Very sedentary. No recent chest pains. No orthopnea.  Patient complains of skin lesion scalp area. He describes what he thought was a "pimple" in this region several weeks ago. He expressed a hard core out of the center and now has a crater in this region. Nontender. No drainage.  Past Medical History  Diagnosis Date  . Diabetes mellitus   . Heart disease   . Hyperlipidemia   . Cardiomyopathy, nonischemic 1998  . Atrial fibrillation   . OSA (obstructive sleep apnea)    Past Surgical History  Procedure Laterality Date  . Cardiac defibrillator placement  2006  . Implantable cardioverter defibrillator (icd) generator change N/A 05/10/2014    Procedure: ICD GENERATOR CHANGE;  Surgeon: [name], MD;  Location: [facility] CATH LAB;  Service: Cardiovascular;  Laterality: N/A;    reports that he has never smoked. He does not have any smokeless tobacco history on file. He reports that he does not drink alcohol or use illicit drugs. family history includes Heart disease in his mother; Hypertension in his father; Lupus in his mother; Stroke in his father; Sudden death in his mother. Allergies  Allergen Reactions  . Niaspan [Niacin Er] Other (See Comments)    flushes      Review of Systems  Constitutional: Positive for fatigue.  Eyes: Negative for visual disturbance.  Respiratory: Negative for cough, chest tightness and shortness of breath.   Cardiovascular: Positive  for leg swelling. Negative for chest pain and palpitations.  Gastrointestinal: Negative for abdominal pain.  Endocrine: Negative for polydipsia and polyuria.  Genitourinary: Negative for dysuria.  Neurological: Negative for dizziness, syncope, weakness, light-headedness and headaches.       Objective:   Physical Exam  Constitutional: He appears well-developed and well-nourished.  Cardiovascular: Normal rate and regular rhythm.  Exam reveals no gallop.   Pulmonary/Chest: Effort normal and breath sounds normal. No respiratory distress. He has no wheezes. He has no rales.  Musculoskeletal: He exhibits edema.  Has trace pitting edema legs bilaterally  Skin:  Mid parietal area reveals proximally 1 cm superficial crater with hyperkeratotic type center. No purulence and no surrounding erythema          Assessment & Plan:  #1 type 2 diabetes. History of good control. Recheck A1c #2 history of chronic systolic heart failure. Symptomatically stable. Check digoxin level along with BNP and bmet. #3 hyperlipidemia. Check lipid and hepatic panel #4 probable epidermal cyst scalp. This has opened up recently and is not currently draining or infected. Set up dermatology referral with [facility].

## 2014-10-04 NOTE — Progress Notes (Signed)
Pre visit review using our clinic review tool, if applicable. No additional management support is needed unless otherwise documented below in the visit note. 

## 2014-10-05 LAB — DIGOXIN LEVEL: Digoxin Level: 0.9 ng/mL (ref 0.8–2.0)

## 2014-10-10 ENCOUNTER — Ambulatory Visit: Payer: Commercial Managed Care - HMO | Admitting: Family

## 2014-11-14 ENCOUNTER — Ambulatory Visit (INDEPENDENT_AMBULATORY_CARE_PROVIDER_SITE_OTHER): Payer: Commercial Managed Care - HMO | Admitting: Family

## 2014-11-14 ENCOUNTER — Ambulatory Visit: Payer: Commercial Managed Care - HMO

## 2014-11-14 DIAGNOSIS — I481 Persistent atrial fibrillation: Secondary | ICD-10-CM

## 2014-11-14 DIAGNOSIS — Z5181 Encounter for therapeutic drug level monitoring: Secondary | ICD-10-CM | POA: Diagnosis not present

## 2014-11-14 DIAGNOSIS — I4819 Other persistent atrial fibrillation: Secondary | ICD-10-CM

## 2014-11-14 LAB — POCT INR: INR: 3.6

## 2014-11-14 NOTE — Patient Instructions (Signed)
Hold Coumadin today. Continue to take 1 tablet all days except 1 1/2 tablet on Monday.  Re-check 4 weeks.   Anticoagulation Dose Instructions as of 11/14/2014      Dorene Grebe Tue Wed Thu Fri Sat   New Dose 5 mg 7.5 mg 5 mg 5 mg 5 mg 5 mg 5 mg    Description        Hold Coumadin today. Continue to take 1 tablet all days except 1 1/2 tablet on Monday.  Re-check 4 weeks.

## 2014-11-18 ENCOUNTER — Other Ambulatory Visit: Payer: Self-pay

## 2014-11-18 MED ORDER — DIGOXIN 250 MCG PO TABS: ORAL_TABLET | ORAL | Status: DC

## 2014-11-21 ENCOUNTER — Telehealth: Payer: Self-pay | Admitting: Cardiology

## 2014-11-21 ENCOUNTER — Other Ambulatory Visit: Payer: Self-pay

## 2014-11-21 ENCOUNTER — Telehealth: Payer: Self-pay | Admitting: Family Medicine

## 2014-11-21 ENCOUNTER — Encounter: Payer: Commercial Managed Care - HMO | Admitting: *Deleted

## 2014-11-21 MED ORDER — DIGOXIN 250 MCG PO TABS: ORAL_TABLET | ORAL | Status: DC

## 2014-11-21 MED ORDER — RAMIPRIL 5 MG PO CAPS: 5.0000 mg | ORAL_CAPSULE | Freq: Every day | ORAL | Status: DC

## 2014-11-21 NOTE — Telephone Encounter (Signed)
LMOVM reminding pt to send remote transmission.   

## 2014-11-21 NOTE — Telephone Encounter (Signed)
Pt needs refill ramipril 5 mg #90 with refills sent to  [facility]

## 2014-11-21 NOTE — Telephone Encounter (Signed)
Rx sent to mail order

## 2014-11-22 ENCOUNTER — Encounter: Payer: Self-pay | Admitting: Cardiology

## 2014-11-27 ENCOUNTER — Telehealth: Payer: Self-pay | Admitting: Internal Medicine

## 2014-11-27 MED ORDER — DIGOXIN 250 MCG PO TABS: ORAL_TABLET | ORAL | Status: DC

## 2014-11-27 NOTE — Telephone Encounter (Signed)
New message      Pt c/o medication issue:  1. Name of Medication: digoxin 2. How are you currently taking this medication (dosage and times per day)? 0.25 daily  3. Are you having a reaction (difficulty breathing--STAT)? no 4. What is your medication issue? Pt took last pill Monday.  It will still be another 5-7 days before mail order rx will arrive.  Can he stay off this medication this long or should he get a few pills from the local pharmacy?

## 2014-11-27 NOTE — Telephone Encounter (Signed)
Left message for patient to call back tomorrow. [facility], pt did not have any refills on file there, so I reordered 30 tablets to [facility]. Asked pharmacy to contact patient to pick up medicine so he is not without it.

## 2014-11-28 MED ORDER — DIGOXIN 250 MCG PO TABS: 0.2500 mg | ORAL_TABLET | Freq: Every day | ORAL | Status: DC

## 2014-11-28 NOTE — Telephone Encounter (Signed)
Patients wife called.  They are still waiting for digoxin to come from [facility] mail order.   She is going to pick up only partial prescription at [facility] due to cost.  I re ordered digoxin through [facility], since it cancelled out when I sent refill to [facility].  Advised patient's wife to contact [facility] for update on the 15 day supply coming.

## 2014-12-02 ENCOUNTER — Ambulatory Visit (INDEPENDENT_AMBULATORY_CARE_PROVIDER_SITE_OTHER): Payer: Medicare HMO | Admitting: Ophthalmology

## 2014-12-10 ENCOUNTER — Telehealth: Payer: Self-pay | Admitting: Internal Medicine

## 2014-12-10 NOTE — Telephone Encounter (Signed)
Instructed pt wife how to send manual. Pt wife verbalized understanding.

## 2014-12-10 NOTE — Telephone Encounter (Signed)
New message       1. Has your device fired? no 2. Is you device beeping? no  3. Are you experiencing draining or swelling at device site? no  4. Are you calling to see if we received your device transmission? no 5. Have you passed out? No Need help sending a transmission

## 2014-12-12 ENCOUNTER — Ambulatory Visit (INDEPENDENT_AMBULATORY_CARE_PROVIDER_SITE_OTHER): Payer: Commercial Managed Care - HMO | Admitting: General Practice

## 2014-12-12 DIAGNOSIS — Z5181 Encounter for therapeutic drug level monitoring: Secondary | ICD-10-CM

## 2014-12-12 LAB — POCT INR: INR: 2.9

## 2014-12-12 NOTE — Progress Notes (Signed)
Pre visit review using our clinic review tool, if applicable. No additional management support is needed unless otherwise documented below in the visit note. 

## 2014-12-13 DIAGNOSIS — L729 Follicular cyst of the skin and subcutaneous tissue, unspecified: Secondary | ICD-10-CM | POA: Diagnosis not present

## 2014-12-13 DIAGNOSIS — C4442 Squamous cell carcinoma of skin of scalp and neck: Secondary | ICD-10-CM | POA: Diagnosis not present

## 2014-12-16 ENCOUNTER — Ambulatory Visit (INDEPENDENT_AMBULATORY_CARE_PROVIDER_SITE_OTHER): Payer: Commercial Managed Care - HMO | Admitting: Ophthalmology

## 2014-12-16 ENCOUNTER — Telehealth: Payer: Self-pay | Admitting: *Deleted

## 2014-12-16 DIAGNOSIS — I10 Essential (primary) hypertension: Secondary | ICD-10-CM

## 2014-12-16 DIAGNOSIS — E11319 Type 2 diabetes mellitus with unspecified diabetic retinopathy without macular edema: Secondary | ICD-10-CM | POA: Diagnosis not present

## 2014-12-16 DIAGNOSIS — H35373 Puckering of macula, bilateral: Secondary | ICD-10-CM | POA: Diagnosis not present

## 2014-12-16 DIAGNOSIS — H43813 Vitreous degeneration, bilateral: Secondary | ICD-10-CM | POA: Diagnosis not present

## 2014-12-16 DIAGNOSIS — H35033 Hypertensive retinopathy, bilateral: Secondary | ICD-10-CM

## 2014-12-16 DIAGNOSIS — E11329 Type 2 diabetes mellitus with mild nonproliferative diabetic retinopathy without macular edema: Secondary | ICD-10-CM

## 2014-12-16 NOTE — Telephone Encounter (Signed)
S/w Alecia @ [phone] with [employer] for prior auth will fax over form.

## 2014-12-24 DIAGNOSIS — E11319 Type 2 diabetes mellitus with unspecified diabetic retinopathy without macular edema: Secondary | ICD-10-CM | POA: Diagnosis not present

## 2014-12-24 DIAGNOSIS — H40013 Open angle with borderline findings, low risk, bilateral: Secondary | ICD-10-CM | POA: Diagnosis not present

## 2014-12-24 DIAGNOSIS — H35033 Hypertensive retinopathy, bilateral: Secondary | ICD-10-CM | POA: Diagnosis not present

## 2014-12-24 DIAGNOSIS — H2513 Age-related nuclear cataract, bilateral: Secondary | ICD-10-CM | POA: Diagnosis not present

## 2014-12-31 ENCOUNTER — Encounter: Payer: Self-pay | Admitting: Family Medicine

## 2014-12-31 ENCOUNTER — Ambulatory Visit (INDEPENDENT_AMBULATORY_CARE_PROVIDER_SITE_OTHER): Payer: Commercial Managed Care - HMO | Admitting: Family Medicine

## 2014-12-31 VITALS — BP 120/80 | HR 81 | Temp 98.1°F | Wt 298.0 lb

## 2014-12-31 DIAGNOSIS — R220 Localized swelling, mass and lump, head: Secondary | ICD-10-CM | POA: Diagnosis not present

## 2014-12-31 NOTE — Progress Notes (Signed)
Subjective:    Patient ID: Collin [name], male    DOB: [birth year], 74 y.o.   MRN: [id number]  HPI Patient seen with right submandibular mass. Apparently first noticed couple of months ago but over the past couple weeks increasing in size and much firmer to palpation. No associated pain. He thinks this was larger yesterday. No clear correlation with eating. Did not noticed any surface redness or warmth. No recent appetite or weight changes. He had lab work back in January that was basically unremarkable.  He was recently seen by [facility] for ulcerative lesion scalp. 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 He has appointment tomorrow to have larger excision with Mohs surgery  He denies any recent cough, dyspnea, abdominal pain, nausea, vomiting fever, chills, or night sweats  Past Medical History  Diagnosis Date  . Diabetes mellitus   . Heart disease   . Hyperlipidemia   . Cardiomyopathy, nonischemic 1998  . Atrial fibrillation   . OSA (obstructive sleep apnea)    Past Surgical History  Procedure Laterality Date  . Cardiac defibrillator placement  2006  . Implantable cardioverter defibrillator (icd) generator change N/A 05/10/2014    Procedure: ICD GENERATOR CHANGE;  Surgeon: [name], MD;  Location: [facility] CATH LAB;  Service: Cardiovascular;  Laterality: N/A;    reports that he has never smoked. He does not have any smokeless tobacco history on file. He reports that he does not drink alcohol or use illicit drugs. family history includes Heart disease in his mother; Hypertension in his father; Lupus in his mother; Stroke in his father; Sudden death in his mother. Allergies  Allergen Reactions  . Niaspan [Niacin Er] Other (See Comments)    flushes      Review of Systems  Constitutional: Negative for fever, chills and unexpected weight change.  HENT: Negative for sore throat, trouble swallowing and voice change.   Respiratory: Negative for cough and  shortness of breath.   Cardiovascular: Negative for chest pain.  Gastrointestinal: Negative for abdominal pain.  Hematological: Does not bruise/bleed easily.       Objective:   Physical Exam  Constitutional: He appears well-developed and well-nourished. No distress.  HENT:  Mouth/Throat: Oropharynx is clear and moist.  Neck: Neck supple. No thyromegaly present.  Patient has large approximately 4 x 4.5 cm right submandibular mass. This is firm to palpation and mostly non-mobile. Nontender to palpation no overlying erythema or warmth  Cardiovascular: Normal rate and regular rhythm.   Pulmonary/Chest: Effort normal and breath sounds normal. No respiratory distress. He has no wheezes. He has no rales.          Assessment & Plan:  Large right submandibular mass. This is somewhat worrisome because of firmness to palpation and limited mobility. We need to rule out Lymphoma, among other things. No red flags such as appetite or weight change.  No other adenopathy noted.  He does not have any signs of submandibular gland infection. Set up ENT referral

## 2014-12-31 NOTE — Progress Notes (Signed)
Pre visit review using our clinic review tool, if applicable. No additional management support is needed unless otherwise documented below in the visit note. 

## 2014-12-31 NOTE — Patient Instructions (Signed)
We will call you regarding ENT referral.

## 2015-01-01 DIAGNOSIS — C4442 Squamous cell carcinoma of skin of scalp and neck: Secondary | ICD-10-CM | POA: Diagnosis not present

## 2015-01-01 HISTORY — PX: MOHS SURGERY: SUR867

## 2015-01-02 ENCOUNTER — Other Ambulatory Visit: Payer: Self-pay | Admitting: Otolaryngology

## 2015-01-02 ENCOUNTER — Other Ambulatory Visit (HOSPITAL_COMMUNITY)
Admission: RE | Admit: 2015-01-02 | Discharge: 2015-01-02 | Disposition: A | Discharge status: Home / Self Care | Payer: Commercial Managed Care - HMO | Source: Ambulatory Visit | Attending: Otolaryngology | Admitting: Otolaryngology

## 2015-01-02 DIAGNOSIS — R221 Localized swelling, mass and lump, neck: Secondary | ICD-10-CM | POA: Diagnosis not present

## 2015-01-02 DIAGNOSIS — D37032 Neoplasm of uncertain behavior of the submandibular salivary glands: Secondary | ICD-10-CM | POA: Diagnosis not present

## 2015-01-02 DIAGNOSIS — L729 Follicular cyst of the skin and subcutaneous tissue, unspecified: Secondary | ICD-10-CM | POA: Diagnosis not present

## 2015-01-09 ENCOUNTER — Ambulatory Visit (INDEPENDENT_AMBULATORY_CARE_PROVIDER_SITE_OTHER): Payer: Commercial Managed Care - HMO | Admitting: General Practice

## 2015-01-09 DIAGNOSIS — Z5181 Encounter for therapeutic drug level monitoring: Secondary | ICD-10-CM

## 2015-01-09 LAB — POCT INR: INR: 2

## 2015-01-09 NOTE — Progress Notes (Signed)
Pre visit review using our clinic review tool, if applicable. No additional management support is needed unless otherwise documented below in the visit note. 

## 2015-02-01 ENCOUNTER — Other Ambulatory Visit: Payer: Self-pay | Admitting: Family

## 2015-02-04 ENCOUNTER — Other Ambulatory Visit: Payer: Self-pay | Admitting: General Practice

## 2015-02-04 MED ORDER — WARFARIN SODIUM 5 MG PO TABS: ORAL_TABLET | ORAL | Status: DC

## 2015-02-13 ENCOUNTER — Ambulatory Visit (INDEPENDENT_AMBULATORY_CARE_PROVIDER_SITE_OTHER): Payer: Commercial Managed Care - HMO | Admitting: General Practice

## 2015-02-13 DIAGNOSIS — Z5181 Encounter for therapeutic drug level monitoring: Secondary | ICD-10-CM | POA: Diagnosis not present

## 2015-02-13 DIAGNOSIS — I4891 Unspecified atrial fibrillation: Secondary | ICD-10-CM

## 2015-02-13 LAB — POCT INR: INR: 2.2

## 2015-02-13 NOTE — Progress Notes (Signed)
Pre visit review using our clinic review tool, if applicable. No additional management support is needed unless otherwise documented below in the visit note. 

## 2015-02-23 ENCOUNTER — Other Ambulatory Visit: Payer: Self-pay | Admitting: Family Medicine

## 2015-03-27 ENCOUNTER — Ambulatory Visit (INDEPENDENT_AMBULATORY_CARE_PROVIDER_SITE_OTHER): Payer: Commercial Managed Care - HMO | Admitting: General Practice

## 2015-03-27 DIAGNOSIS — Z5181 Encounter for therapeutic drug level monitoring: Secondary | ICD-10-CM | POA: Diagnosis not present

## 2015-03-27 DIAGNOSIS — I4891 Unspecified atrial fibrillation: Secondary | ICD-10-CM | POA: Diagnosis not present

## 2015-03-27 LAB — POCT INR: INR: 1.8

## 2015-03-27 NOTE — Progress Notes (Signed)
Agree with assessment and plan 

## 2015-03-27 NOTE — Progress Notes (Signed)
Pre visit review using our clinic review tool, if applicable. No additional management support is needed unless otherwise documented below in the visit note. 

## 2015-04-20 ENCOUNTER — Other Ambulatory Visit: Payer: Self-pay | Admitting: Family Medicine

## 2015-04-20 ENCOUNTER — Other Ambulatory Visit: Payer: Self-pay | Admitting: Internal Medicine

## 2015-04-22 NOTE — Telephone Encounter (Signed)
I do not see where Dr Harrington Challenger has ever refilled this. Lipids checked by pcp. Ok to refill or defer to pcp? Please advise. Thanks, MI

## 2015-04-23 NOTE — Telephone Encounter (Signed)
I would go ahead a defer to PCP.- Thanks!

## 2015-04-30 ENCOUNTER — Telehealth: Payer: Self-pay | Admitting: Family Medicine

## 2015-04-30 MED ORDER — PRAVASTATIN SODIUM 40 MG PO TABS
40.0000 mg | ORAL_TABLET | Freq: Every day | ORAL | Status: DC
Start: 2015-04-30 — End: 2015-10-06

## 2015-04-30 NOTE — Telephone Encounter (Signed)
Refill for 6 months. 

## 2015-04-30 NOTE — Telephone Encounter (Signed)
Rx sent to mail order

## 2015-04-30 NOTE — Telephone Encounter (Signed)
Is it okay to refill?

## 2015-04-30 NOTE — Telephone Encounter (Signed)
Pt request refill of the following: pravastatin (PRAVACHOL) 40 MG tablet  This was rx by his cardiologist and is asking if Dr Sinclair Ship will    Phamacy: [facility] mailorder

## 2015-05-02 ENCOUNTER — Encounter: Payer: Self-pay | Admitting: *Deleted

## 2015-05-08 ENCOUNTER — Other Ambulatory Visit: Payer: Self-pay | Admitting: General Practice

## 2015-05-08 ENCOUNTER — Ambulatory Visit (INDEPENDENT_AMBULATORY_CARE_PROVIDER_SITE_OTHER): Payer: Commercial Managed Care - HMO | Admitting: General Practice

## 2015-05-08 DIAGNOSIS — I4891 Unspecified atrial fibrillation: Secondary | ICD-10-CM

## 2015-05-08 DIAGNOSIS — Z5181 Encounter for therapeutic drug level monitoring: Secondary | ICD-10-CM

## 2015-05-08 LAB — POCT INR: INR: 2.4

## 2015-05-08 MED ORDER — WARFARIN SODIUM 5 MG PO TABS: ORAL_TABLET | ORAL | Status: DC

## 2015-05-08 NOTE — Progress Notes (Signed)
Pre visit review using our clinic review tool, if applicable. No additional management support is needed unless otherwise documented below in the visit note. 

## 2015-05-08 NOTE — Progress Notes (Signed)
Agree with Coumadin management recommendations

## 2015-05-27 ENCOUNTER — Telehealth: Payer: Self-pay | Admitting: Family Medicine

## 2015-05-27 NOTE — Telephone Encounter (Signed)
Pt call to say he had a knot drained from his throat and now it has returned and need to have it drained again. Pt is requesting a referral to see Dr Radene Journey again

## 2015-05-28 NOTE — Telephone Encounter (Signed)
Okay to refer? 

## 2015-05-28 NOTE — Telephone Encounter (Signed)
OK to refer.

## 2015-05-28 NOTE — Telephone Encounter (Signed)
Dr Elease Hashimoto has to put a referral in epic

## 2015-05-29 ENCOUNTER — Other Ambulatory Visit: Payer: Self-pay | Admitting: Family Medicine

## 2015-05-29 DIAGNOSIS — R221 Localized swelling, mass and lump, neck: Principal | ICD-10-CM

## 2015-05-29 DIAGNOSIS — J392 Other diseases of pharynx: Secondary | ICD-10-CM

## 2015-05-29 NOTE — Telephone Encounter (Signed)
Referral made 

## 2015-05-30 DIAGNOSIS — K116 Mucocele of salivary gland: Secondary | ICD-10-CM | POA: Diagnosis not present

## 2015-06-10 ENCOUNTER — Encounter: Payer: Self-pay | Admitting: Internal Medicine

## 2015-06-10 ENCOUNTER — Ambulatory Visit (INDEPENDENT_AMBULATORY_CARE_PROVIDER_SITE_OTHER): Payer: Commercial Managed Care - HMO | Admitting: Internal Medicine

## 2015-06-10 VITALS — BP 138/90 | HR 72 | Ht 73.0 in | Wt 281.6 lb

## 2015-06-10 DIAGNOSIS — I482 Chronic atrial fibrillation, unspecified: Secondary | ICD-10-CM

## 2015-06-10 DIAGNOSIS — I429 Cardiomyopathy, unspecified: Secondary | ICD-10-CM

## 2015-06-10 DIAGNOSIS — I428 Other cardiomyopathies: Secondary | ICD-10-CM

## 2015-06-10 DIAGNOSIS — I5022 Chronic systolic (congestive) heart failure: Secondary | ICD-10-CM | POA: Diagnosis not present

## 2015-06-10 LAB — CUP PACEART INCLINIC DEVICE CHECK
Date Time Interrogation Session: 20161004040000
HighPow Impedance: 44 Ohm
Lead Channel Impedance Value: 478 Ohm
Lead Channel Pacing Threshold Amplitude: 0.7 V
Lead Channel Pacing Threshold Pulse Width: 0.4 ms
Lead Channel Sensing Intrinsic Amplitude: 19 mV
Lead Channel Setting Pacing Amplitude: 2.4 V
Lead Channel Setting Pacing Pulse Width: 0.4 ms
Lead Channel Setting Sensing Sensitivity: 0.6 mV
Pulse Gen Serial Number: 191056
Zone Setting Detection Interval: 300 ms

## 2015-06-10 NOTE — Assessment & Plan Note (Signed)
His [place] device is working normally. He has had several fairly long non-standing episodes of VT. No AA drug therapy at this point.

## 2015-06-10 NOTE — Progress Notes (Signed)
HPI Collin [name] returns today for followup. He is a very pleasant 74 year old man with a nonischemic cardiomyopathy and chronic systolic heart failure, status post ICD implantation with recent generator change. Repeat 2D echo last year demonstrated an EF 35-40%. Previously it had been higher.  In the interim he has had no syncope. He remains in atrial fib. Allergies  Allergen Reactions  . Niaspan [Niacin Er] Other (See Comments)    flushes     Current Outpatient Prescriptions  Medication Sig Dispense Refill  . acetaminophen (TYLENOL) 500 MG tablet Take 1,000 mg by mouth every 6 (six) hours as needed for moderate pain.    . carvedilol (COREG) 25 MG tablet Take 25 mg by mouth 2 (two) times daily with a meal.    . digoxin (LANOXIN) 0.25 MG tablet Take 1 tablet (0.25 mg total) by mouth daily. 90 tablet 3  . metFORMIN (GLUCOPHAGE) 500 MG tablet Take 500 mg by mouth 2 (two) times daily with a meal.    . pravastatin (PRAVACHOL) 40 MG tablet Take 1 tablet (40 mg total) by mouth daily. 90 tablet 1  . ramipril (ALTACE) 5 MG capsule Take 5 mg by mouth daily.    Marland Kitchen warfarin (COUMADIN) 5 MG tablet Take as directed by anticoagulation clinic. 105 tablet 3   No current facility-administered medications for this visit.     Past Medical History  Diagnosis Date  . Diabetes mellitus   . Heart disease   . Hyperlipidemia   . Cardiomyopathy, nonischemic ([place]) 1998  . Atrial fibrillation ([place])   . OSA (obstructive sleep apnea)     ROS:   All systems reviewed and negative except as noted in the HPI.   Past Surgical History  Procedure Laterality Date  . Cardiac defibrillator placement  2006  . Implantable cardioverter defibrillator (icd) generator change N/A 05/10/2014    Procedure: ICD GENERATOR CHANGE;  Surgeon: Collin Lance, MD;  Location: [facility] CATH LAB;  Service: Cardiovascular;  Laterality: N/A;     Family History  Problem Relation Age of Onset  . Heart disease Mother   . Sudden death  Mother     under age 4  . Lupus Mother   . Hypertension Father   . Stroke Father      Social History   Social History  . Marital Status: Married    Spouse Name: N/A  . Number of Children: N/A  . Years of Education: N/A   Occupational History  . Not on file.   Social History Main Topics  . Smoking status: Never Smoker   . Smokeless tobacco: Not on file  . Alcohol Use: No  . Drug Use: No  . Sexual Activity: Not on file   Other Topics Concern  . Not on file   Social History Narrative     BP 138/90 mmHg  Pulse 72  Ht 6\' 1"  (1.854 m)  Wt 281 lb 9.6 oz (127.733 kg)  BMI 37.16 kg/m2  SpO2 92%  Physical Exam:  Well appearing 74 year old man, NAD HEENT: Unremarkable Neck:  7 cm JVD, no thyromegally Lungs:  Clear with no wheezes, rales, or rhonchi. HEART:  IRegular rate rhythm, no murmurs, no rubs, no clicks Abd:  soft, positive bowel sounds, no organomegally, no rebound, no guarding Ext:  2 plus pulses, no edema, no cyanosis, no clubbing Skin:  No rashes no nodules Neuro:  CN II through XII intact, motor grossly intact   DEVICE  Normal device function.  See PaceArt for details.  Assess/Plan:

## 2015-06-10 NOTE — Patient Instructions (Signed)
Medication Instructions:  Your physician recommends that you continue on your current medications as directed. Please refer to the Current Medication list given to you today.   Labwork: None ordered   Testing/Procedures: None ordered   Follow-Up: Remote monitoring is used to monitor your ICD from home. This monitoring reduces the number of office visits required to check your device to one time per year. It allows [place] to keep an eye on the functioning of your device to ensure it is working properly. You are scheduled for a device check from home on 09/09/15. You may send your transmission at any time that day. If you have a wireless device, the transmission will be sent automatically. After your physician reviews your transmission, you will receive a postcard with your next transmission date.    Your physician wants you to follow-up in: 12 months with Dr Knox Saliva will receive a reminder letter in the mail two months in advance. If you don't receive a letter, please call our office to schedule the follow-up appointment.   Any Other Special Instructions Will Be Listed Below (If Applicable).

## 2015-06-10 NOTE — Assessment & Plan Note (Signed)
His ventricular rate is controlled. He will remain on systemic anti-coagulation.

## 2015-06-10 NOTE — Assessment & Plan Note (Signed)
His symptoms are class 2. He continues to have sodium indiscretion. I have asked him to reduce his salt intake. He has some edema but previously developed hypotension with diuretic therapy. Will hold on this for now.

## 2015-06-16 ENCOUNTER — Other Ambulatory Visit: Payer: Self-pay | Admitting: Family Medicine

## 2015-06-19 ENCOUNTER — Ambulatory Visit (INDEPENDENT_AMBULATORY_CARE_PROVIDER_SITE_OTHER): Payer: Commercial Managed Care - HMO | Admitting: General Practice

## 2015-06-19 DIAGNOSIS — Z5181 Encounter for therapeutic drug level monitoring: Secondary | ICD-10-CM | POA: Diagnosis not present

## 2015-06-19 LAB — POCT INR: INR: 2.2

## 2015-06-19 NOTE — Progress Notes (Signed)
Pre visit review using our clinic review tool, if applicable. No additional management support is needed unless otherwise documented below in the visit note. 

## 2015-06-25 DIAGNOSIS — H40013 Open angle with borderline findings, low risk, bilateral: Secondary | ICD-10-CM | POA: Diagnosis not present

## 2015-06-25 DIAGNOSIS — H40033 Anatomical narrow angle, bilateral: Secondary | ICD-10-CM | POA: Diagnosis not present

## 2015-08-07 ENCOUNTER — Ambulatory Visit (INDEPENDENT_AMBULATORY_CARE_PROVIDER_SITE_OTHER): Payer: Commercial Managed Care - HMO | Admitting: General Practice

## 2015-08-07 DIAGNOSIS — Z5181 Encounter for therapeutic drug level monitoring: Secondary | ICD-10-CM | POA: Diagnosis not present

## 2015-08-07 LAB — POCT INR: INR: 3.3

## 2015-08-07 NOTE — Progress Notes (Signed)
Agree with coumadin management. 

## 2015-08-07 NOTE — Progress Notes (Signed)
Pre visit review using our clinic review tool, if applicable. No additional management support is needed unless otherwise documented below in the visit note. 

## 2015-08-08 NOTE — Progress Notes (Signed)
I am ok with this plan  

## 2015-09-04 ENCOUNTER — Ambulatory Visit: Payer: Commercial Managed Care - HMO

## 2015-09-09 ENCOUNTER — Ambulatory Visit (INDEPENDENT_AMBULATORY_CARE_PROVIDER_SITE_OTHER): Payer: Commercial Managed Care - HMO | Admitting: *Deleted

## 2015-09-09 DIAGNOSIS — I429 Cardiomyopathy, unspecified: Secondary | ICD-10-CM

## 2015-09-09 DIAGNOSIS — I428 Other cardiomyopathies: Secondary | ICD-10-CM

## 2015-09-10 NOTE — Progress Notes (Signed)
Remote ICD transmission.   

## 2015-09-11 ENCOUNTER — Ambulatory Visit (INDEPENDENT_AMBULATORY_CARE_PROVIDER_SITE_OTHER): Payer: Commercial Managed Care - HMO | Admitting: General Practice

## 2015-09-11 DIAGNOSIS — Z5181 Encounter for therapeutic drug level monitoring: Secondary | ICD-10-CM

## 2015-09-11 LAB — POCT INR: INR: 2.9

## 2015-09-11 NOTE — Progress Notes (Signed)
Pre visit review using our clinic review tool, if applicable. No additional management support is needed unless otherwise documented below in the visit note. 

## 2015-09-15 NOTE — Progress Notes (Signed)
I have reviewed and agree with the plan. 

## 2015-09-19 LAB — CUP PACEART REMOTE DEVICE CHECK
Battery Remaining Longevity: 144 mo
Battery Remaining Percentage: 100 %
Brady Statistic RV Percent Paced: 12 %
Date Time Interrogation Session: 20170103105300
HighPow Impedance: 44 Ohm
Implantable Lead Implant Date: 20060209
Implantable Lead Location: 753860
Implantable Lead Model: 158
Implantable Lead Serial Number: 156422
Lead Channel Impedance Value: 487 Ohm
Lead Channel Pacing Threshold Amplitude: 0.7 V
Lead Channel Pacing Threshold Pulse Width: 0.4 ms
Lead Channel Setting Pacing Amplitude: 2.4 V
Lead Channel Setting Pacing Pulse Width: 0.4 ms
Lead Channel Setting Sensing Sensitivity: 0.6 mV
Pulse Gen Serial Number: 191056

## 2015-09-24 ENCOUNTER — Encounter: Payer: Self-pay | Admitting: Cardiology

## 2015-09-26 DIAGNOSIS — H40013 Open angle with borderline findings, low risk, bilateral: Secondary | ICD-10-CM | POA: Diagnosis not present

## 2015-09-26 DIAGNOSIS — H40033 Anatomical narrow angle, bilateral: Secondary | ICD-10-CM | POA: Diagnosis not present

## 2015-10-06 ENCOUNTER — Other Ambulatory Visit: Payer: Self-pay | Admitting: Family Medicine

## 2015-10-08 ENCOUNTER — Ambulatory Visit (INDEPENDENT_AMBULATORY_CARE_PROVIDER_SITE_OTHER): Payer: Commercial Managed Care - HMO | Admitting: General Practice

## 2015-10-08 DIAGNOSIS — Z5181 Encounter for therapeutic drug level monitoring: Secondary | ICD-10-CM | POA: Diagnosis not present

## 2015-10-08 DIAGNOSIS — I4891 Unspecified atrial fibrillation: Secondary | ICD-10-CM | POA: Diagnosis not present

## 2015-10-08 LAB — POCT INR: INR: 3.1

## 2015-10-08 NOTE — Progress Notes (Signed)
Pre visit review using our clinic review tool, if applicable. No additional management support is needed unless otherwise documented below in the visit note. 

## 2015-11-05 ENCOUNTER — Ambulatory Visit (INDEPENDENT_AMBULATORY_CARE_PROVIDER_SITE_OTHER): Payer: Commercial Managed Care - HMO | Admitting: General Practice

## 2015-11-05 DIAGNOSIS — I4891 Unspecified atrial fibrillation: Secondary | ICD-10-CM | POA: Diagnosis not present

## 2015-11-05 DIAGNOSIS — Z5181 Encounter for therapeutic drug level monitoring: Secondary | ICD-10-CM | POA: Diagnosis not present

## 2015-11-05 LAB — POCT INR: INR: 2.7

## 2015-11-05 NOTE — Progress Notes (Signed)
Pre visit review using our clinic review tool, if applicable. No additional management support is needed unless otherwise documented below in the visit note. 

## 2015-11-13 ENCOUNTER — Encounter: Payer: Self-pay | Admitting: Family Medicine

## 2015-11-13 ENCOUNTER — Ambulatory Visit (INDEPENDENT_AMBULATORY_CARE_PROVIDER_SITE_OTHER): Payer: Commercial Managed Care - HMO | Admitting: Family Medicine

## 2015-11-13 VITALS — BP 120/80 | HR 82 | Temp 98.6°F | Ht 73.0 in | Wt 287.1 lb

## 2015-11-13 DIAGNOSIS — Z79899 Other long term (current) drug therapy: Secondary | ICD-10-CM

## 2015-11-13 DIAGNOSIS — I5022 Chronic systolic (congestive) heart failure: Secondary | ICD-10-CM

## 2015-11-13 DIAGNOSIS — E118 Type 2 diabetes mellitus with unspecified complications: Secondary | ICD-10-CM

## 2015-11-13 DIAGNOSIS — R600 Localized edema: Secondary | ICD-10-CM

## 2015-11-13 LAB — BASIC METABOLIC PANEL
BUN: 21 mg/dL (ref 6–23)
CO2: 28 mEq/L (ref 19–32)
Calcium: 9.4 mg/dL (ref 8.4–10.5)
Chloride: 104 mEq/L (ref 96–112)
Creatinine, Ser: 0.74 mg/dL (ref 0.40–1.50)
GFR: 109.67 mL/min (ref >60.00)
Glucose, Bld: 137 mg/dL — ABNORMAL HIGH (ref 70–99)
Potassium: 4.2 mEq/L (ref 3.5–5.1)
Sodium: 142 mEq/L (ref 135–145)

## 2015-11-13 LAB — LIPID PANEL
Cholesterol: 160 mg/dL (ref 0–200)
HDL: 34.3 mg/dL — ABNORMAL LOW (ref >39.00)
Total CHOL/HDL Ratio: 5
Triglycerides: 421 mg/dL — ABNORMAL HIGH (ref 0.0–149.0)

## 2015-11-13 LAB — HEMOGLOBIN A1C: Hgb A1c MFr Bld: 6.9 % — ABNORMAL HIGH (ref 4.6–6.5)

## 2015-11-13 LAB — LDL CHOLESTEROL, DIRECT: Direct LDL: 72 mg/dL

## 2015-11-13 LAB — HEPATIC FUNCTION PANEL
ALT: 30 U/L (ref 0–53)
AST: 19 U/L (ref 0–37)
Albumin: 4.2 g/dL (ref 3.5–5.2)
Alkaline Phosphatase: 87 U/L (ref 39–117)
Bilirubin, Direct: 0.1 mg/dL (ref 0.0–0.3)
Total Bilirubin: 0.4 mg/dL (ref 0.2–1.2)
Total Protein: 7.2 g/dL (ref 6.0–8.3)

## 2015-11-13 LAB — TSH: TSH: 0.97 u[IU]/mL (ref 0.35–4.50)

## 2015-11-13 MED ORDER — FUROSEMIDE 20 MG PO TABS: 20.0000 mg | ORAL_TABLET | Freq: Every day | ORAL | Status: DC

## 2015-11-13 NOTE — Progress Notes (Signed)
Subjective:    Patient ID: Collin [name], male    DOB: 12/07/1940, 75 y.o.   MRN: [id number]  HPI Bilateral lower leg and ankle swelling. Duration past couple weeks. Denies any increased orthopnea or exertional dyspnea. Very sedentary. Wife states that he sits around much of the day with his feet dependent. He has history of type 2 diabetes, obstructive sleep apnea, nonischemic cardiomyopathy, hyperlipidemia, chronic systolic heart failure, atrial fibrillation. He is anticoagulated with Coumadin.  Other medications include carvedilol, digoxin, metformin, pravastatin, Altace Previously took spirinolactone but had hypotension. Denies any leg pain. No leg ulcers.    Past Medical History  Diagnosis Date  . Diabetes mellitus   . Heart disease   . Hyperlipidemia   . Cardiomyopathy, nonischemic (HCC) 1998  . Atrial fibrillation (HCC)   . OSA (obstructive sleep apnea)    Past Surgical History  Procedure Laterality Date  . Cardiac defibrillator placement  2006  . Implantable cardioverter defibrillator (icd) generator change N/A 05/10/2014    Procedure: ICD GENERATOR CHANGE;  Surgeon: [name], MD;  Location: [facility] CATH LAB;  Service: Cardiovascular;  Laterality: N/A;    reports that he has never smoked. He does not have any smokeless tobacco history on file. He reports that he does not drink alcohol or use illicit drugs. family history includes Heart disease in his mother; Hypertension in his father; Lupus in his mother; Stroke in his father; Sudden death in his mother. Allergies  Allergen Reactions  . Niaspan [Niacin Er] Other (See Comments)    flushes  \     Review of Systems  Constitutional: Positive for fatigue.  Eyes: Negative for visual disturbance.  Respiratory: Negative for cough, chest tightness and shortness of breath.   Cardiovascular: Positive for leg swelling. Negative for chest pain and palpitations.  Gastrointestinal: Negative for abdominal pain.    Neurological: Negative for dizziness, syncope, weakness, light-headedness and headaches.       Objective:   Physical Exam  Constitutional: He appears well-developed and well-nourished.  Cardiovascular: Normal rate.   Pulmonary/Chest: Effort normal and breath sounds normal. No respiratory distress. He has no wheezes. He has no rales.  Musculoskeletal: He exhibits edema.  Trace to 1+ pitting edema feet ankles bilaterally. Left ankle slightly more edematous than right. No warmth. No bony tenderness. No Achilles tenderness. No bruising.  Skin:  Good capillary refill in both feet. He has calluses on both feet involving great toe. No ulceration. Palpable dorsalis pedis pulse bilaterally          Assessment & Plan:  Bilateral leg and ankle edema. Suspect multifactorial. He has history of nonischemic cardiomyopathy but no evidence for overt heart failure. Suspect good part of this is venous stasis edema. He is reluctant to consider compression hose. Needs multiple labs followed up including lipid, hepatic, basic metabolic panel, digoxin level, hemoglobin A1c  Start low-dose furosemide 20 mg once daily. Elevate legs frequently. Reassess one week. Need to consider diabetic shoes

## 2015-11-13 NOTE — Patient Instructions (Signed)
Elevate legs frequently Watch your sodium intake.

## 2015-11-14 ENCOUNTER — Encounter: Payer: Self-pay | Admitting: Family Medicine

## 2015-11-14 LAB — DIGOXIN LEVEL: Digoxin Level: 0.6 ug/L — ABNORMAL LOW (ref 0.8–2.0)

## 2015-11-20 ENCOUNTER — Other Ambulatory Visit: Payer: Self-pay | Admitting: Family Medicine

## 2015-11-20 ENCOUNTER — Ambulatory Visit (INDEPENDENT_AMBULATORY_CARE_PROVIDER_SITE_OTHER): Payer: Commercial Managed Care - HMO | Admitting: Family Medicine

## 2015-11-20 ENCOUNTER — Encounter: Payer: Self-pay | Admitting: Family Medicine

## 2015-11-20 VITALS — BP 100/70 | HR 65 | Temp 97.7°F | Ht 73.0 in | Wt 280.0 lb

## 2015-11-20 DIAGNOSIS — R600 Localized edema: Secondary | ICD-10-CM | POA: Diagnosis not present

## 2015-11-20 NOTE — Progress Notes (Signed)
Pre visit review using our clinic review tool, if applicable. No additional management support is needed unless otherwise documented below in the visit note. 

## 2015-11-20 NOTE — Patient Instructions (Signed)

## 2015-11-20 NOTE — Progress Notes (Signed)
Subjective:    Patient ID: Collin [name], male    DOB: 1941-07-11, 75 y.o.   MRN: [id number]  HPI  Follow-up regarding recent increased peripheral edema. Refer to prior note. We obtained several labs which were basically unremarkable. We started low-dose furosemide 20 mg daily. His weight is down 7 pounds and his edema has essentially resolved. Feels much better overall.  Past Medical History  Diagnosis Date  . Diabetes mellitus   . Heart disease   . Hyperlipidemia   . Cardiomyopathy, nonischemic (HCC) 1998  . Atrial fibrillation (HCC)   . OSA (obstructive sleep apnea)    Past Surgical History  Procedure Laterality Date  . Cardiac defibrillator placement  2006  . Implantable cardioverter defibrillator (icd) generator change N/A 05/10/2014    Procedure: ICD GENERATOR CHANGE;  Surgeon: [name], MD;  Location: [facility] CATH LAB;  Service: Cardiovascular;  Laterality: N/A;    reports that he has never smoked. He does not have any smokeless tobacco history on file. He reports that he does not drink alcohol or use illicit drugs. family history includes Heart disease in his mother; Hypertension in his father; Lupus in his mother; Stroke in his father; Sudden death in his mother. Allergies  Allergen Reactions  . Niaspan [Niacin Er] Other (See Comments)    flushes     Review of Systems  Constitutional: Negative for appetite change and fatigue.  Eyes: Negative for visual disturbance.  Respiratory: Negative for cough, chest tightness and shortness of breath.   Cardiovascular: Negative for chest pain, palpitations and leg swelling.  Endocrine: Negative for polydipsia and polyuria.  Neurological: Negative for dizziness, syncope, weakness, light-headedness and headaches.       Objective:   Physical Exam  Constitutional: He is oriented to person, place, and time. He appears well-developed and well-nourished.  HENT:  Right Ear: External ear normal.  Left Ear: External ear normal.    Mouth/Throat: Oropharynx is clear and moist.  Eyes: Pupils are equal, round, and reactive to light.  Neck: Neck supple. No thyromegaly present.  Cardiovascular: Normal rate and regular rhythm.   Pulmonary/Chest: Effort normal and breath sounds normal. No respiratory distress. He has no wheezes. He has no rales.  Musculoskeletal: He exhibits no edema.  His pitting edema from last week has fully resolved.  Neurological: He is alert and oriented to person, place, and time.          Assessment & Plan:  Bilateral leg edema. Suspect multifactorial. Tremendously improved with low-dose furosemide. Try reducing furosemide to 20 mg every Monday, Wednesday, and Friday. Monitor daily weights. Information on high potassium diet given. Reassess 3 months and check basic metabolic panel then

## 2015-12-03 ENCOUNTER — Ambulatory Visit (INDEPENDENT_AMBULATORY_CARE_PROVIDER_SITE_OTHER): Payer: Commercial Managed Care - HMO | Admitting: General Practice

## 2015-12-03 DIAGNOSIS — Z5181 Encounter for therapeutic drug level monitoring: Secondary | ICD-10-CM | POA: Diagnosis not present

## 2015-12-03 DIAGNOSIS — I4891 Unspecified atrial fibrillation: Secondary | ICD-10-CM

## 2015-12-03 LAB — POCT INR: INR: 2.7

## 2015-12-03 NOTE — Progress Notes (Signed)
Pre visit review using our clinic review tool, if applicable. No additional management support is needed unless otherwise documented below in the visit note. 

## 2015-12-09 ENCOUNTER — Ambulatory Visit (INDEPENDENT_AMBULATORY_CARE_PROVIDER_SITE_OTHER): Payer: Commercial Managed Care - HMO | Admitting: *Deleted

## 2015-12-09 ENCOUNTER — Telehealth: Payer: Self-pay | Admitting: Cardiology

## 2015-12-09 DIAGNOSIS — I429 Cardiomyopathy, unspecified: Secondary | ICD-10-CM | POA: Diagnosis not present

## 2015-12-09 DIAGNOSIS — I428 Other cardiomyopathies: Secondary | ICD-10-CM

## 2015-12-09 NOTE — Telephone Encounter (Signed)
LMOVM reminding pt to send remote transmission.   

## 2015-12-10 ENCOUNTER — Telehealth: Payer: Self-pay | Admitting: Internal Medicine

## 2015-12-10 NOTE — Telephone Encounter (Signed)
Returned [employer].  Advised that transmission was received.  EC is appreciative of call and denies additional questions at this time.

## 2015-12-10 NOTE — Telephone Encounter (Signed)
New message   4. Are you calling to see if we received your device transmission? Yes

## 2015-12-11 NOTE — Progress Notes (Signed)
Remote ICD transmission.   

## 2015-12-17 ENCOUNTER — Ambulatory Visit (INDEPENDENT_AMBULATORY_CARE_PROVIDER_SITE_OTHER): Payer: Medicare HMO | Admitting: Ophthalmology

## 2015-12-24 ENCOUNTER — Telehealth: Payer: Self-pay | Admitting: Family Medicine

## 2015-12-24 NOTE — Telephone Encounter (Signed)
Pt is calling a referral to [facility]  [id number])

## 2015-12-26 DIAGNOSIS — H35371 Puckering of macula, right eye: Secondary | ICD-10-CM | POA: Diagnosis not present

## 2015-12-26 DIAGNOSIS — H35372 Puckering of macula, left eye: Secondary | ICD-10-CM | POA: Diagnosis not present

## 2015-12-26 DIAGNOSIS — H35033 Hypertensive retinopathy, bilateral: Secondary | ICD-10-CM | POA: Diagnosis not present

## 2015-12-26 DIAGNOSIS — H2513 Age-related nuclear cataract, bilateral: Secondary | ICD-10-CM | POA: Diagnosis not present

## 2015-12-26 DIAGNOSIS — H40013 Open angle with borderline findings, low risk, bilateral: Secondary | ICD-10-CM | POA: Diagnosis not present

## 2015-12-26 DIAGNOSIS — H40033 Anatomical narrow angle, bilateral: Secondary | ICD-10-CM | POA: Diagnosis not present

## 2015-12-26 DIAGNOSIS — H25013 Cortical age-related cataract, bilateral: Secondary | ICD-10-CM | POA: Diagnosis not present

## 2015-12-31 ENCOUNTER — Ambulatory Visit (INDEPENDENT_AMBULATORY_CARE_PROVIDER_SITE_OTHER): Payer: Commercial Managed Care - HMO | Admitting: General Practice

## 2015-12-31 DIAGNOSIS — I4891 Unspecified atrial fibrillation: Secondary | ICD-10-CM | POA: Diagnosis not present

## 2015-12-31 DIAGNOSIS — Z5181 Encounter for therapeutic drug level monitoring: Secondary | ICD-10-CM

## 2015-12-31 LAB — POCT INR: INR: 2.7

## 2015-12-31 NOTE — Progress Notes (Signed)
Pre visit review using our clinic review tool, if applicable. No additional management support is needed unless otherwise documented below in the visit note. 

## 2016-01-01 ENCOUNTER — Ambulatory Visit (INDEPENDENT_AMBULATORY_CARE_PROVIDER_SITE_OTHER): Payer: Medicare HMO | Admitting: Ophthalmology

## 2016-01-05 ENCOUNTER — Ambulatory Visit (INDEPENDENT_AMBULATORY_CARE_PROVIDER_SITE_OTHER): Payer: Commercial Managed Care - HMO | Admitting: Ophthalmology

## 2016-01-05 DIAGNOSIS — E11319 Type 2 diabetes mellitus with unspecified diabetic retinopathy without macular edema: Secondary | ICD-10-CM

## 2016-01-05 DIAGNOSIS — I10 Essential (primary) hypertension: Secondary | ICD-10-CM

## 2016-01-05 DIAGNOSIS — E113293 Type 2 diabetes mellitus with mild nonproliferative diabetic retinopathy without macular edema, bilateral: Secondary | ICD-10-CM | POA: Diagnosis not present

## 2016-01-05 DIAGNOSIS — H43813 Vitreous degeneration, bilateral: Secondary | ICD-10-CM

## 2016-01-05 DIAGNOSIS — H35033 Hypertensive retinopathy, bilateral: Secondary | ICD-10-CM | POA: Diagnosis not present

## 2016-01-07 DIAGNOSIS — H219 Unspecified disorder of iris and ciliary body: Secondary | ICD-10-CM | POA: Diagnosis not present

## 2016-01-07 DIAGNOSIS — H2512 Age-related nuclear cataract, left eye: Secondary | ICD-10-CM | POA: Diagnosis not present

## 2016-01-07 DIAGNOSIS — H5703 Miosis: Secondary | ICD-10-CM | POA: Diagnosis not present

## 2016-01-07 DIAGNOSIS — H21562 Pupillary abnormality, left eye: Secondary | ICD-10-CM | POA: Diagnosis not present

## 2016-01-14 DIAGNOSIS — H2511 Age-related nuclear cataract, right eye: Secondary | ICD-10-CM | POA: Diagnosis not present

## 2016-01-14 DIAGNOSIS — H25011 Cortical age-related cataract, right eye: Secondary | ICD-10-CM | POA: Diagnosis not present

## 2016-01-21 DIAGNOSIS — H2511 Age-related nuclear cataract, right eye: Secondary | ICD-10-CM | POA: Diagnosis not present

## 2016-01-26 ENCOUNTER — Other Ambulatory Visit: Payer: Self-pay | Admitting: Family Medicine

## 2016-01-29 ENCOUNTER — Encounter: Payer: Self-pay | Admitting: Cardiology

## 2016-01-29 LAB — CUP PACEART REMOTE DEVICE CHECK
Battery Remaining Longevity: 144 mo
Battery Remaining Percentage: 100 %
Brady Statistic RV Percent Paced: 13 %
Date Time Interrogation Session: 20170404210100
HighPow Impedance: 42 Ohm
Implantable Lead Implant Date: 20060209
Implantable Lead Location: 753860
Implantable Lead Model: 158
Implantable Lead Serial Number: 156422
Lead Channel Impedance Value: 438 Ohm
Lead Channel Pacing Threshold Amplitude: 0.7 V
Lead Channel Pacing Threshold Pulse Width: 0.4 ms
Lead Channel Setting Pacing Amplitude: 2.4 V
Lead Channel Setting Pacing Pulse Width: 0.4 ms
Lead Channel Setting Sensing Sensitivity: 0.6 mV
Pulse Gen Serial Number: 191056

## 2016-02-06 LAB — COLOGUARD: Cologuard: NEGATIVE

## 2016-02-09 ENCOUNTER — Other Ambulatory Visit: Payer: Self-pay | Admitting: Internal Medicine

## 2016-02-11 ENCOUNTER — Ambulatory Visit (INDEPENDENT_AMBULATORY_CARE_PROVIDER_SITE_OTHER): Payer: Commercial Managed Care - HMO | Admitting: General Practice

## 2016-02-11 DIAGNOSIS — I4891 Unspecified atrial fibrillation: Secondary | ICD-10-CM

## 2016-02-11 DIAGNOSIS — Z5181 Encounter for therapeutic drug level monitoring: Secondary | ICD-10-CM

## 2016-02-11 LAB — POCT INR: INR: 2.3

## 2016-02-11 NOTE — Progress Notes (Signed)
Pre visit review using our clinic review tool, if applicable. No additional management support is needed unless otherwise documented below in the visit note. 

## 2016-02-26 ENCOUNTER — Encounter: Payer: Self-pay | Admitting: Family Medicine

## 2016-02-27 ENCOUNTER — Other Ambulatory Visit: Payer: Self-pay | Admitting: Internal Medicine

## 2016-03-10 ENCOUNTER — Ambulatory Visit (INDEPENDENT_AMBULATORY_CARE_PROVIDER_SITE_OTHER): Payer: Commercial Managed Care - HMO | Admitting: *Deleted

## 2016-03-10 DIAGNOSIS — I429 Cardiomyopathy, unspecified: Secondary | ICD-10-CM | POA: Diagnosis not present

## 2016-03-10 DIAGNOSIS — I428 Other cardiomyopathies: Secondary | ICD-10-CM

## 2016-03-10 LAB — CUP PACEART REMOTE DEVICE CHECK
Battery Remaining Longevity: 144 mo
Battery Remaining Percentage: 100 %
Brady Statistic RV Percent Paced: 13 %
Date Time Interrogation Session: 20170705094200
HighPow Impedance: 43 Ohm
Implantable Lead Implant Date: 20060209
Implantable Lead Location: 753860
Implantable Lead Model: 158
Implantable Lead Serial Number: 156422
Lead Channel Impedance Value: 456 Ohm
Lead Channel Setting Pacing Amplitude: 2.4 V
Lead Channel Setting Pacing Pulse Width: 0.4 ms
Lead Channel Setting Sensing Sensitivity: 0.6 mV
Pulse Gen Serial Number: 191056

## 2016-03-10 NOTE — Progress Notes (Signed)
Remote ICD transmission.   

## 2016-03-12 ENCOUNTER — Encounter: Payer: Self-pay | Admitting: Cardiology

## 2016-03-17 ENCOUNTER — Encounter: Payer: Self-pay | Admitting: Cardiology

## 2016-03-24 ENCOUNTER — Ambulatory Visit (INDEPENDENT_AMBULATORY_CARE_PROVIDER_SITE_OTHER): Payer: Commercial Managed Care - HMO | Admitting: General Practice

## 2016-03-24 DIAGNOSIS — I4891 Unspecified atrial fibrillation: Secondary | ICD-10-CM | POA: Diagnosis not present

## 2016-03-24 DIAGNOSIS — Z5181 Encounter for therapeutic drug level monitoring: Secondary | ICD-10-CM

## 2016-03-24 LAB — POCT INR: INR: 3.4

## 2016-03-25 NOTE — Progress Notes (Signed)
Pre visit review using our clinic review tool, if applicable. No additional management support is needed unless otherwise documented below in the visit note. 

## 2016-04-12 ENCOUNTER — Other Ambulatory Visit: Payer: Self-pay | Admitting: Family Medicine

## 2016-04-12 ENCOUNTER — Telehealth: Payer: Self-pay

## 2016-04-12 DIAGNOSIS — H00026 Hordeolum internum left eye, unspecified eyelid: Secondary | ICD-10-CM | POA: Diagnosis not present

## 2016-04-12 DIAGNOSIS — R600 Localized edema: Secondary | ICD-10-CM | POA: Diagnosis not present

## 2016-04-12 NOTE — Telephone Encounter (Signed)
Spoke with Clarise Cruz at Ophthalmology and MD wants pt to be on Keflex 500mg  bid for 5 days due to surgical procedure that has been done. Okay for this since he is warfarin?

## 2016-04-12 NOTE — Telephone Encounter (Signed)
Yes- but would advise repeat INR near end of Keflex dosing.

## 2016-04-12 NOTE — Telephone Encounter (Signed)
Tried calling and left a message. "He needs to start antibiotic tomorrow 04/13/16, and come in at 10am to see Jenny Reichmann at Coumadin clinic.

## 2016-04-12 NOTE — Telephone Encounter (Signed)
After speaking with pt, he was told by the Opthalmologist not to start Antibiotic until later this week if the redness/swelling did not start to improve. So he will not be following up Thursday. I did let him know either me or you will reach out to him Monday to see if he started the medication and have him follow up then. What are your thoughts? Thanks.

## 2016-04-13 NOTE — Telephone Encounter (Signed)
That's fine

## 2016-04-15 ENCOUNTER — Ambulatory Visit: Payer: Commercial Managed Care - HMO

## 2016-04-15 NOTE — Telephone Encounter (Signed)
He will call back if he decides to take it so we can check his INR.

## 2016-04-15 NOTE — Telephone Encounter (Signed)
Spoke with pt this morning. He has not started the antibiotic and does not plan too at this time. The redness and swelling is improving.

## 2016-04-21 ENCOUNTER — Ambulatory Visit (INDEPENDENT_AMBULATORY_CARE_PROVIDER_SITE_OTHER): Payer: Commercial Managed Care - HMO | Admitting: General Practice

## 2016-04-21 DIAGNOSIS — Z5181 Encounter for therapeutic drug level monitoring: Secondary | ICD-10-CM

## 2016-04-21 DIAGNOSIS — I4891 Unspecified atrial fibrillation: Secondary | ICD-10-CM

## 2016-04-21 LAB — POCT INR: INR: 2.8

## 2016-05-13 DIAGNOSIS — H40013 Open angle with borderline findings, low risk, bilateral: Secondary | ICD-10-CM | POA: Diagnosis not present

## 2016-05-13 DIAGNOSIS — H00025 Hordeolum internum left lower eyelid: Secondary | ICD-10-CM | POA: Diagnosis not present

## 2016-05-19 ENCOUNTER — Ambulatory Visit (INDEPENDENT_AMBULATORY_CARE_PROVIDER_SITE_OTHER): Payer: Commercial Managed Care - HMO | Admitting: General Practice

## 2016-05-19 DIAGNOSIS — Z5181 Encounter for therapeutic drug level monitoring: Secondary | ICD-10-CM | POA: Diagnosis not present

## 2016-05-19 DIAGNOSIS — I4891 Unspecified atrial fibrillation: Secondary | ICD-10-CM

## 2016-05-19 LAB — POCT INR: INR: 3

## 2016-06-07 ENCOUNTER — Other Ambulatory Visit: Payer: Self-pay | Admitting: Family Medicine

## 2016-06-07 ENCOUNTER — Other Ambulatory Visit: Payer: Self-pay | Admitting: Internal Medicine

## 2016-06-08 ENCOUNTER — Other Ambulatory Visit: Payer: Self-pay | Admitting: General Practice

## 2016-06-08 MED ORDER — WARFARIN SODIUM 5 MG PO TABS: ORAL_TABLET | ORAL | 1 refills | Status: DC

## 2016-06-16 ENCOUNTER — Ambulatory Visit (INDEPENDENT_AMBULATORY_CARE_PROVIDER_SITE_OTHER): Payer: Commercial Managed Care - HMO | Admitting: General Practice

## 2016-06-16 DIAGNOSIS — I4891 Unspecified atrial fibrillation: Secondary | ICD-10-CM

## 2016-06-16 DIAGNOSIS — Z5181 Encounter for therapeutic drug level monitoring: Secondary | ICD-10-CM

## 2016-06-16 LAB — POCT INR: INR: 2.5

## 2016-06-20 ENCOUNTER — Other Ambulatory Visit: Payer: Self-pay | Admitting: Internal Medicine

## 2016-06-28 ENCOUNTER — Other Ambulatory Visit: Payer: Self-pay | Admitting: Family Medicine

## 2016-07-12 ENCOUNTER — Telehealth: Payer: Self-pay | Admitting: Internal Medicine

## 2016-07-12 MED ORDER — DIGOXIN 250 MCG PO TABS: 0.2500 mg | ORAL_TABLET | Freq: Every day | ORAL | 0 refills | Status: DC

## 2016-07-12 NOTE — Telephone Encounter (Signed)
Patient would like to receive a 90 day supply of Digoxin .25 mg instead of 30 day supply.  He only has about 10 pills left.

## 2016-07-13 ENCOUNTER — Other Ambulatory Visit: Payer: Self-pay | Admitting: *Deleted

## 2016-07-13 MED ORDER — DIGOXIN 250 MCG PO TABS: 0.2500 mg | ORAL_TABLET | Freq: Every day | ORAL | 0 refills | Status: DC

## 2016-07-28 ENCOUNTER — Ambulatory Visit: Payer: Commercial Managed Care - HMO

## 2016-08-12 DIAGNOSIS — Z961 Presence of intraocular lens: Secondary | ICD-10-CM | POA: Diagnosis not present

## 2016-08-12 DIAGNOSIS — E119 Type 2 diabetes mellitus without complications: Secondary | ICD-10-CM | POA: Diagnosis not present

## 2016-08-12 DIAGNOSIS — H35373 Puckering of macula, bilateral: Secondary | ICD-10-CM | POA: Diagnosis not present

## 2016-08-12 DIAGNOSIS — H00025 Hordeolum internum left lower eyelid: Secondary | ICD-10-CM | POA: Diagnosis not present

## 2016-08-12 LAB — HM DIABETES EYE EXAM

## 2016-08-13 ENCOUNTER — Encounter: Payer: Self-pay | Admitting: Family Medicine

## 2016-08-17 ENCOUNTER — Encounter: Payer: Commercial Managed Care - HMO | Admitting: Internal Medicine

## 2016-08-23 ENCOUNTER — Ambulatory Visit (INDEPENDENT_AMBULATORY_CARE_PROVIDER_SITE_OTHER): Payer: Commercial Managed Care - HMO | Admitting: General Practice

## 2016-08-23 DIAGNOSIS — Z5181 Encounter for therapeutic drug level monitoring: Secondary | ICD-10-CM | POA: Diagnosis not present

## 2016-08-23 DIAGNOSIS — I4891 Unspecified atrial fibrillation: Secondary | ICD-10-CM

## 2016-08-23 LAB — POCT INR: INR: 3.2

## 2016-08-23 NOTE — Patient Instructions (Signed)
Pre visit review using our clinic review tool, if applicable. No additional management support is needed unless otherwise documented below in the visit note. 

## 2016-09-07 ENCOUNTER — Ambulatory Visit (INDEPENDENT_AMBULATORY_CARE_PROVIDER_SITE_OTHER): Payer: Commercial Managed Care - HMO | Admitting: Family Medicine

## 2016-09-07 ENCOUNTER — Encounter: Payer: Self-pay | Admitting: Family Medicine

## 2016-09-07 ENCOUNTER — Ambulatory Visit: Payer: Commercial Managed Care - HMO | Admitting: Internal Medicine

## 2016-09-07 VITALS — BP 100/60 | HR 76 | Temp 97.5°F | Ht 73.0 in | Wt 276.9 lb

## 2016-09-07 DIAGNOSIS — R05 Cough: Secondary | ICD-10-CM | POA: Diagnosis not present

## 2016-09-07 DIAGNOSIS — R062 Wheezing: Secondary | ICD-10-CM | POA: Diagnosis not present

## 2016-09-07 DIAGNOSIS — R059 Cough, unspecified: Secondary | ICD-10-CM

## 2016-09-07 MED ORDER — DOXYCYCLINE HYCLATE 100 MG PO CAPS: 100.0000 mg | ORAL_CAPSULE | Freq: Two times a day (BID) | ORAL | 0 refills | Status: DC

## 2016-09-07 MED ORDER — PREDNISONE 20 MG PO TABS: ORAL_TABLET | ORAL | 0 refills | Status: DC

## 2016-09-07 NOTE — Patient Instructions (Signed)
Follow up for any fever or increased shortness of breath. 

## 2016-09-07 NOTE — Progress Notes (Signed)
Pre visit review using our clinic review tool, if applicable. No additional management support is needed unless otherwise documented below in the visit note. 

## 2016-09-07 NOTE — Progress Notes (Signed)
Subjective:     Patient ID: Collin [name], male   DOB: 1941/08/20, 76 y.o.   MRN: [id number]  HPI Acute visit for upper respiratory symptoms with cough. Onset last Wednesday. He's had cough productive of thick green sputum. No dyspnea. Possibly some mild wheezing intermittently. No fevers or chills. No nausea or vomiting. Cough is been fairly severe at times. Patient has never smoked. Does have obstructive sleep apnea history and also type 2 diabetes as well as history of heart failure and atrial fibrillation. He is on chronic Coumadin. Recent INR stable  Past Medical History:  Diagnosis Date  . Atrial fibrillation ([place])   . Cardiomyopathy, nonischemic ([place]) 1998  . Diabetes mellitus   . Heart disease   . Hyperlipidemia   . OSA (obstructive sleep apnea)    Past Surgical History:  Procedure Laterality Date  . CARDIAC DEFIBRILLATOR PLACEMENT  2006  . IMPLANTABLE CARDIOVERTER DEFIBRILLATOR (ICD) GENERATOR CHANGE N/A 05/10/2014   Procedure: ICD GENERATOR CHANGE;  Surgeon: [name], MD;  Location: [facility] CATH LAB;  Service: Cardiovascular;  Laterality: N/A;    reports that he has never smoked. He does not have any smokeless tobacco history on file. He reports that he does not drink alcohol or use drugs. family history includes Heart disease in his mother; Hypertension in his father; Lupus in his mother; Stroke in his father; Sudden death in his mother. Allergies  Allergen Reactions  . Niaspan [Niacin Er] Other (See Comments)    flushes     Review of Systems  Constitutional: Positive for fatigue. Negative for chills and fever.  HENT: Negative for sore throat.   Respiratory: Positive for cough and wheezing. Negative for shortness of breath.   Cardiovascular: Negative for chest pain.       Objective:   Physical Exam  Constitutional: He appears well-developed and well-nourished. No distress.  HENT:  Right Ear: External ear normal.  Left Ear: External ear normal.  Mouth/Throat:  Oropharynx is clear and moist.  Neck: Neck supple.  Cardiovascular: Normal rate.   Pulmonary/Chest: Effort normal. No respiratory distress. He has wheezes. He has no rales.  Mild expiratory wheezes. Normal respiratory rate. No retractions.  Musculoskeletal: He exhibits no edema.  Lymphadenopathy:    He has no cervical adenopathy.       Assessment:     Cough with mild reactive airway component. Patient is in no respiratory distress     Plan:     -Start doxycycline 100 mg twice daily for 10 days -Stay well-hydrated -Consider over-the-counter plain Mucinex twice daily -Prednisone 20 mg 2 tablets daily for 6 days -Follow-up immediately for any fever or increasing shortness of breath  Eulas Post MD [facility] at [facility]

## 2016-09-16 ENCOUNTER — Encounter: Payer: Self-pay | Admitting: Internal Medicine

## 2016-09-16 ENCOUNTER — Ambulatory Visit (INDEPENDENT_AMBULATORY_CARE_PROVIDER_SITE_OTHER): Payer: Medicare HMO | Admitting: Internal Medicine

## 2016-09-16 VITALS — BP 132/81 | HR 76 | Ht 73.0 in | Wt 285.1 lb

## 2016-09-16 DIAGNOSIS — I4891 Unspecified atrial fibrillation: Secondary | ICD-10-CM | POA: Diagnosis not present

## 2016-09-16 DIAGNOSIS — E785 Hyperlipidemia, unspecified: Secondary | ICD-10-CM

## 2016-09-16 DIAGNOSIS — I5022 Chronic systolic (congestive) heart failure: Secondary | ICD-10-CM | POA: Diagnosis not present

## 2016-09-16 NOTE — Patient Instructions (Addendum)
Your physician recommends that you continue on your current medications as directed. Please refer to the Current Medication list given to you today.  Your physician recommends that you return for lab work today (CBC, BNP, BMET, TSH, LIPIDS, DIGOXIN LEVEL)  Your physician wants you to follow-up in: [place].  You will receive a reminder letter in the mail two months in advance. If you don't receive a letter, please call our office to schedule the follow-up appointment.

## 2016-09-16 NOTE — Progress Notes (Signed)
Cardiology Office Note   Date:  09/16/2016   ID:  Collin [name], DOB [birth year], MRN [id number]  PCP:  [name], MD  Cardiologist:   Dietrich Pates, MD   F/U of CHF and atrial fib      History of Present Illness: Collin [name] is a 76 y.o. male with a history of DM, atrial fib, HL, sleep apnea and NICM   Hx of ICD  I saw him in Jan 2016  He is also followed by [name]    He is currently recovering from URI  Still with cough  Occasionally prod of sputum which has been green but is now clearing  Dnies fevers He denies CP  Chronic edema      Current Meds  Medication Sig  . acetaminophen (TYLENOL) 500 MG tablet Take 1,000 mg by mouth every 6 (six) hours as needed for moderate pain.  . carvedilol (COREG) 25 MG tablet TAKE 1 TABLET TWICE DAILY WITH MEALS  . digoxin (LANOXIN) 0.25 MG tablet Take 1 tablet (0.25 mg total) by mouth daily.  . furosemide (LASIX) 20 MG tablet Take 1 tablet (20 mg total) by mouth daily.  . metFORMIN (GLUCOPHAGE) 500 MG tablet TAKE 1 TABLET TWICE DAILY WITH MEALS  . pravastatin (PRAVACHOL) 40 MG tablet TAKE 1 TABLET EVERY DAY  . predniSONE (DELTASONE) 20 MG tablet Take two tablets once daily for 6 days.  . ramipril (ALTACE) 5 MG capsule TAKE 1 CAPSULE DAILY  . warfarin (COUMADIN) 5 MG tablet Take as directed by anticoagulation clinic.     Allergies:   Niaspan [niacin er]   Past Medical History:  Diagnosis Date  . Atrial fibrillation (HCC)   . Cardiomyopathy, nonischemic (HCC) 1998  . Diabetes mellitus   . Heart disease   . Hyperlipidemia   . OSA (obstructive sleep apnea)     Past Surgical History:  Procedure Laterality Date  . CARDIAC DEFIBRILLATOR PLACEMENT  2006  . IMPLANTABLE CARDIOVERTER DEFIBRILLATOR (ICD) GENERATOR CHANGE N/A 05/10/2014   Procedure: ICD GENERATOR CHANGE;  Surgeon: [name], MD;  Location: [facility] CATH LAB;  Service: Cardiovascular;  Laterality: N/A;     Social History:  The patient  reports that he has  never smoked. He has never used smokeless tobacco. He reports that he does not drink alcohol or use drugs.   Family History:  The patient's family history includes Heart disease in his mother; Hypertension in his father; Lupus in his mother; Stroke in his father; Sudden death in his mother.    ROS:  Please see the history of present illness. All other systems are reviewed and  Negative to the above problem except as noted.    PHYSICAL EXAM: VS:  There were no vitals taken for this visit.  GEN: Well nourished, well developed, in no acute distress  HEENT: normal  Neck: JVP is increased  , carotid bruits, or masses Cardiac: Irreg rate /rhythm  ; no murmurs, rubs, or gallops,`1+ edema  Respiratory:  clear to auscultation bilaterally, normal work of breathing GI: soft, nontender, nondistended, + BS  No hepatomegaly  MS: no deformity Moving all extremities   Skin: warm and dry, no rash Neuro:  Strength and sensation are intact Psych: euthymic mood, full affect   EKG:  EKG is ordered today.  Atrail fib  76 bpm  IWMI     Lipid Panel    Component Value Date/Time   CHOL 160 11/13/2015 1525   TRIG (H) 11/13/2015 1525  421.0 Triglyceride is over 400; calculations on Lipids are invalid.   HDL 34.30 (L) 11/13/2015 1525   CHOLHDL 5 11/13/2015 1525   VLDL 73.0 (H) 10/04/2014 0931   LDLCALC 95 05/21/2013 1016   LDLDIRECT 72.0 11/13/2015 1525      Wt Readings from Last 3 Encounters:  09/07/16 276 lb 14.4 oz (125.6 kg)  11/20/15 280 lb (127 kg)  11/13/15 287 lb 1.6 oz (130.2 kg)      ASSESSMENT AND PLAN:  1  Chronic systolic CHF   Will get labs today  Volume does appear to be up Has appt with G taylor tomorrow  2.  Permanent atrial fib   Keep on same regimen    3  HL   Will get lipids   4  Pulm  I am not convinced of active bacterial infecton  I would keep on current regimen      Current medicines are reviewed at length with the patient today.  The patient does not have  concerns regarding medicines.  Signed, Dietrich Pates, MD  09/16/2016 2:23 PM    [facility] [address] [place], [place], [place]  [address] Phone: [phone]; Fax: [phone]

## 2016-09-17 ENCOUNTER — Ambulatory Visit (INDEPENDENT_AMBULATORY_CARE_PROVIDER_SITE_OTHER): Payer: Medicare HMO | Admitting: Internal Medicine

## 2016-09-17 ENCOUNTER — Encounter: Payer: Self-pay | Admitting: Internal Medicine

## 2016-09-17 ENCOUNTER — Other Ambulatory Visit: Payer: Self-pay | Admitting: *Deleted

## 2016-09-17 VITALS — BP 100/68 | HR 82 | Ht 73.0 in | Wt 282.8 lb

## 2016-09-17 DIAGNOSIS — I428 Other cardiomyopathies: Secondary | ICD-10-CM

## 2016-09-17 DIAGNOSIS — Z95810 Presence of automatic (implantable) cardiac defibrillator: Secondary | ICD-10-CM

## 2016-09-17 LAB — CUP PACEART INCLINIC DEVICE CHECK
Brady Statistic RV Percent Paced: 14 %
Date Time Interrogation Session: 20180112050000
HighPow Impedance: 45 Ohm
Implantable Lead Implant Date: 20060209
Implantable Lead Location: 753860
Implantable Lead Model: 158
Implantable Lead Serial Number: 156422
Implantable Pulse Generator Implant Date: 20150904
Lead Channel Impedance Value: 452 Ohm
Lead Channel Pacing Threshold Amplitude: 0.7 V
Lead Channel Pacing Threshold Pulse Width: 0.4 ms
Lead Channel Sensing Intrinsic Amplitude: 17.8 mV
Lead Channel Setting Pacing Amplitude: 2.4 V
Lead Channel Setting Pacing Pulse Width: 0.4 ms
Lead Channel Setting Sensing Sensitivity: 0.6 mV
Pulse Gen Serial Number: 191056

## 2016-09-17 LAB — BASIC METABOLIC PANEL
BUN/Creatinine Ratio: 18 (ref 10–24)
BUN: 16 mg/dL (ref 8–27)
CO2: 24 mmol/L (ref 18–29)
Calcium: 9.2 mg/dL (ref 8.6–10.2)
Chloride: 100 mmol/L (ref 96–106)
Creatinine, Ser: 0.88 mg/dL (ref 0.76–1.27)
GFR calc Af Amer: 97 mL/min/{1.73_m2} (ref >59)
GFR calc non Af Amer: 84 mL/min/{1.73_m2} (ref >59)
Glucose: 124 mg/dL — ABNORMAL HIGH (ref 65–99)
Potassium: 4.7 mmol/L (ref 3.5–5.2)
Sodium: 144 mmol/L (ref 134–144)

## 2016-09-17 LAB — LIPID PANEL
Chol/HDL Ratio: 3.7 ratio units (ref 0.0–5.0)
Cholesterol, Total: 156 mg/dL (ref 100–199)
HDL: 42 mg/dL (ref >39)
LDL Calculated: 49 mg/dL (ref 0–99)
Triglycerides: 324 mg/dL — ABNORMAL HIGH (ref 0–149)
VLDL Cholesterol Cal: 65 mg/dL — ABNORMAL HIGH (ref 5–40)

## 2016-09-17 LAB — CBC
Hematocrit: 42.9 % (ref 37.5–51.0)
Hemoglobin: 14.2 g/dL (ref 13.0–17.7)
MCH: 29.2 pg (ref 26.6–33.0)
MCHC: 33.1 g/dL (ref 31.5–35.7)
MCV: 88 fL (ref 79–97)
Platelets: 175 10*3/uL (ref 150–379)
RBC: 4.86 x10E6/uL (ref 4.14–5.80)
RDW: 15.1 % (ref 12.3–15.4)
WBC: 5.3 10*3/uL (ref 3.4–10.8)

## 2016-09-17 LAB — DIGOXIN LEVEL: Digoxin, Serum: 1 ng/mL — ABNORMAL HIGH (ref 0.5–0.9)

## 2016-09-17 LAB — TSH: TSH: 1.16 u[IU]/mL (ref 0.450–4.500)

## 2016-09-17 LAB — BRAIN NATRIURETIC PEPTIDE

## 2016-09-17 MED ORDER — DIGOXIN 125 MCG PO TABS: 0.1250 mg | ORAL_TABLET | Freq: Every day | ORAL | 3 refills | Status: DC

## 2016-09-17 NOTE — Patient Instructions (Addendum)
Medication Instructions:  Your physician recommends that you continue on your current medications as directed. Please refer to the Current Medication list given to you today.   Labwork: None Ordered  Testing/Procedures: None Ordered  Follow-Up: Remote monitoring is used to monitor your  ICD from home. This monitoring reduces the number of office visits required to check your device to one time per year. It allows [place] to keep an eye on the functioning of your device to ensure it is working properly. You are scheduled for a device check from home on 12/20/16. You may send your transmission at any time that day. If you have a wireless device, the transmission will be sent automatically. After your physician reviews your transmission, you will receive a postcard with your next transmission date.   Your physician wants you to follow-up in: 1 YEAR with Dr. Lovena Le. You will receive a reminder letter in the mail two months in advance. If you don't receive a letter, please call our office to schedule the follow-up appointment.  If you need a refill on your cardiac medications before your next appointment, please call your pharmacy.

## 2016-09-17 NOTE — Progress Notes (Signed)
HPI [name] returns today for followup. He is a very pleasant 76 year old man with a nonischemic cardiomyopathy and chronic systolic heart failure, status post ICD implantation with recent generator change. Repeat 2D echo last year demonstrated an EF 35-40%. Previously it had been higher.  In the interim he has had no syncope. He remains in atrial fib. He notes some peripheral edema. Allergies  Allergen Reactions  . Niaspan [Niacin Er] Other (See Comments)    flushes     Current Outpatient Prescriptions  Medication Sig Dispense Refill  . acetaminophen (TYLENOL) 500 MG tablet Take 1,000 mg by mouth every 6 (six) hours as needed for moderate pain.    . carvedilol (COREG) 25 MG tablet TAKE 1 TABLET TWICE DAILY WITH MEALS 180 tablet 1  . digoxin (LANOXIN) 0.25 MG tablet Take 1 tablet (0.25 mg total) by mouth daily. 90 tablet 0  . furosemide (LASIX) 20 MG tablet Take 1 tablet (20 mg total) by mouth daily. 30 tablet 3  . metFORMIN (GLUCOPHAGE) 500 MG tablet TAKE 1 TABLET TWICE DAILY WITH MEALS 180 tablet 2  . pravastatin (PRAVACHOL) 40 MG tablet TAKE 1 TABLET EVERY DAY 90 tablet 1  . predniSONE (DELTASONE) 20 MG tablet Take two tablets once daily for 6 days. 12 tablet 0  . ramipril (ALTACE) 5 MG capsule TAKE 1 CAPSULE DAILY 90 capsule 2  . warfarin (COUMADIN) 5 MG tablet Take as directed by anticoagulation clinic. 105 tablet 1   No current facility-administered medications for this visit.      Past Medical History:  Diagnosis Date  . Atrial fibrillation ([place])   . Cardiomyopathy, nonischemic ([place]) 1998  . Diabetes mellitus   . Heart disease   . Hyperlipidemia   . OSA (obstructive sleep apnea)     ROS:   All systems reviewed and negative except as noted in the HPI.   Past Surgical History:  Procedure Laterality Date  . CARDIAC DEFIBRILLATOR PLACEMENT  2006  . IMPLANTABLE CARDIOVERTER DEFIBRILLATOR (ICD) GENERATOR CHANGE N/A 05/10/2014   Procedure: ICD GENERATOR CHANGE;   Surgeon: Collin Lance, MD;  Location: [facility] CATH LAB;  Service: Cardiovascular;  Laterality: N/A;     Family History  Problem Relation Age of Onset  . Heart disease Mother   . Sudden death Mother     under age 12  . Lupus Mother   . Hypertension Father   . Stroke Father      Social History   Social History  . Marital status: Married    Spouse name: N/A  . Number of children: N/A  . Years of education: N/A   Occupational History  . Not on file.   Social History Main Topics  . Smoking status: Never Smoker  . Smokeless tobacco: Never Used  . Alcohol use No  . Drug use: No  . Sexual activity: Not on file   Other Topics Concern  . Not on file   Social History Narrative  . No narrative on file     BP 100/68   Pulse 82   Ht 6\' 1"  (1.854 m)   Wt 282 lb 12.8 oz (128.3 kg)   SpO2 96%   BMI 37.31 kg/m   Physical Exam:  Well appearing 76 year old man, NAD HEENT: Unremarkable Neck:  7 cm JVD, no thyromegally Lungs:  Clear with no wheezes, rales, or rhonchi. HEART:  IRegular rate rhythm, no murmurs, no rubs, no clicks Abd:  soft, positive bowel sounds, no organomegally, no rebound, no guarding Ext:  2 plus pulses, no edema, no cyanosis, no clubbing Skin:  No rashes no nodules Neuro:  CN II through XII intact, motor grossly intact   DEVICE  Normal device function.  See PaceArt for details.    Assess/Plan: 1. Chronic systolic heart failure - I have encouraged the patient to reduce his salt intake. He will continue his current meds. 2. ICD - he has had VT and his [employer] device is working normally. Will follow. 3. VT - he has had no shocks but successful ATP. I have recommended he not drive but will hold off on adding additional AA drug therapy. 4. Obesity - he has been counseled on trying to lose weight. He will attempt to do better.  Collin [name].D.

## 2016-09-19 LAB — PRO B NATRIURETIC PEPTIDE: NT-Pro BNP: 1110 pg/mL — ABNORMAL HIGH (ref 0–486)

## 2016-09-19 LAB — SPECIMEN STATUS REPORT

## 2016-09-20 ENCOUNTER — Ambulatory Visit (INDEPENDENT_AMBULATORY_CARE_PROVIDER_SITE_OTHER): Payer: Medicare HMO | Admitting: General Practice

## 2016-09-20 ENCOUNTER — Other Ambulatory Visit: Payer: Self-pay | Admitting: Family Medicine

## 2016-09-20 DIAGNOSIS — Z5181 Encounter for therapeutic drug level monitoring: Secondary | ICD-10-CM

## 2016-09-20 DIAGNOSIS — I4891 Unspecified atrial fibrillation: Secondary | ICD-10-CM

## 2016-09-20 LAB — POCT INR: INR: 2.2

## 2016-09-20 NOTE — Patient Instructions (Signed)
Pre visit review using our clinic review tool, if applicable. No additional management support is needed unless otherwise documented below in the visit note. 

## 2016-09-21 ENCOUNTER — Ambulatory Visit: Payer: Medicare HMO

## 2016-09-21 DIAGNOSIS — Z23 Encounter for immunization: Secondary | ICD-10-CM

## 2016-09-21 NOTE — Progress Notes (Signed)
Patient got his flu injection on 09/21/2016 at 9.30am. Given by Rosealee Albee CMA

## 2016-10-18 ENCOUNTER — Ambulatory Visit (INDEPENDENT_AMBULATORY_CARE_PROVIDER_SITE_OTHER): Payer: Medicare HMO | Admitting: General Practice

## 2016-10-18 DIAGNOSIS — Z5181 Encounter for therapeutic drug level monitoring: Secondary | ICD-10-CM | POA: Diagnosis not present

## 2016-10-18 DIAGNOSIS — I4891 Unspecified atrial fibrillation: Secondary | ICD-10-CM | POA: Diagnosis not present

## 2016-10-18 LAB — POCT INR: INR: 2.9

## 2016-10-18 NOTE — Patient Instructions (Signed)
Pre visit review using our clinic review tool, if applicable. No additional management support is needed unless otherwise documented below in the visit note. 

## 2016-10-25 ENCOUNTER — Other Ambulatory Visit: Payer: Self-pay | Admitting: Family Medicine

## 2016-11-05 ENCOUNTER — Telehealth: Payer: Self-pay | Admitting: Family Medicine

## 2016-11-05 DIAGNOSIS — R221 Localized swelling, mass and lump, neck: Secondary | ICD-10-CM

## 2016-11-05 NOTE — Telephone Encounter (Signed)
Pt need a referral to [facility]  for  reoccurrence of cyst on neck. Pt has an appt on 11/08/16 at 130 pm. Pt has [employer]

## 2016-11-08 ENCOUNTER — Other Ambulatory Visit: Payer: Self-pay | Admitting: Otolaryngology

## 2016-11-08 ENCOUNTER — Other Ambulatory Visit: Payer: Self-pay

## 2016-11-08 DIAGNOSIS — D487 Neoplasm of uncertain behavior of other specified sites: Secondary | ICD-10-CM | POA: Diagnosis not present

## 2016-11-08 DIAGNOSIS — IMO0002 Reserved for concepts with insufficient information to code with codable children: Secondary | ICD-10-CM

## 2016-11-08 DIAGNOSIS — R590 Localized enlarged lymph nodes: Secondary | ICD-10-CM | POA: Diagnosis not present

## 2016-11-08 NOTE — Telephone Encounter (Signed)
Referral placed.

## 2016-11-08 NOTE — Telephone Encounter (Signed)
OK 

## 2016-11-08 NOTE — Telephone Encounter (Signed)
Okay to refer? 

## 2016-11-09 ENCOUNTER — Telehealth: Payer: Self-pay

## 2016-11-09 ENCOUNTER — Emergency Department (HOSPITAL_COMMUNITY)
Admission: EM | Admit: 2016-11-09 | Discharge: 2016-11-09 | Disposition: A | Discharge status: Home / Self Care | Payer: Medicare HMO | Source: Home / Self Care | Attending: Emergency Medicine | Admitting: Emergency Medicine

## 2016-11-09 ENCOUNTER — Emergency Department (HOSPITAL_COMMUNITY): Payer: Medicare HMO

## 2016-11-09 ENCOUNTER — Encounter (HOSPITAL_COMMUNITY): Payer: Self-pay

## 2016-11-09 DIAGNOSIS — C01 Malignant neoplasm of base of tongue: Secondary | ICD-10-CM | POA: Diagnosis present

## 2016-11-09 DIAGNOSIS — E1142 Type 2 diabetes mellitus with diabetic polyneuropathy: Secondary | ICD-10-CM | POA: Diagnosis present

## 2016-11-09 DIAGNOSIS — K029 Dental caries, unspecified: Secondary | ICD-10-CM | POA: Diagnosis present

## 2016-11-09 DIAGNOSIS — Z95810 Presence of automatic (implantable) cardiac defibrillator: Secondary | ICD-10-CM

## 2016-11-09 DIAGNOSIS — Z7984 Long term (current) use of oral hypoglycemic drugs: Secondary | ICD-10-CM

## 2016-11-09 DIAGNOSIS — K469 Unspecified abdominal hernia without obstruction or gangrene: Secondary | ICD-10-CM

## 2016-11-09 DIAGNOSIS — I480 Paroxysmal atrial fibrillation: Secondary | ICD-10-CM | POA: Diagnosis not present

## 2016-11-09 DIAGNOSIS — Z79899 Other long term (current) drug therapy: Secondary | ICD-10-CM | POA: Diagnosis not present

## 2016-11-09 DIAGNOSIS — L8990 Pressure ulcer of unspecified site, unspecified stage: Secondary | ICD-10-CM | POA: Diagnosis present

## 2016-11-09 DIAGNOSIS — K148 Other diseases of tongue: Secondary | ICD-10-CM | POA: Diagnosis not present

## 2016-11-09 DIAGNOSIS — K06020 Generalized gingival recession, unspecified: Secondary | ICD-10-CM | POA: Diagnosis present

## 2016-11-09 DIAGNOSIS — R590 Localized enlarged lymph nodes: Secondary | ICD-10-CM | POA: Diagnosis present

## 2016-11-09 DIAGNOSIS — I4891 Unspecified atrial fibrillation: Secondary | ICD-10-CM | POA: Diagnosis present

## 2016-11-09 DIAGNOSIS — K0490 Unspecified diseases of pulp and periapical tissues: Secondary | ICD-10-CM | POA: Diagnosis not present

## 2016-11-09 DIAGNOSIS — Z7901 Long term (current) use of anticoagulants: Secondary | ICD-10-CM

## 2016-11-09 DIAGNOSIS — K56609 Unspecified intestinal obstruction, unspecified as to partial versus complete obstruction: Secondary | ICD-10-CM | POA: Diagnosis not present

## 2016-11-09 DIAGNOSIS — E669 Obesity, unspecified: Secondary | ICD-10-CM | POA: Diagnosis present

## 2016-11-09 DIAGNOSIS — R1084 Generalized abdominal pain: Secondary | ICD-10-CM | POA: Diagnosis not present

## 2016-11-09 DIAGNOSIS — Z4682 Encounter for fitting and adjustment of non-vascular catheter: Secondary | ICD-10-CM | POA: Diagnosis not present

## 2016-11-09 DIAGNOSIS — R140 Abdominal distension (gaseous): Secondary | ICD-10-CM | POA: Diagnosis not present

## 2016-11-09 DIAGNOSIS — Z6837 Body mass index (BMI) 37.0-37.9, adult: Secondary | ICD-10-CM | POA: Diagnosis not present

## 2016-11-09 DIAGNOSIS — K458 Other specified abdominal hernia without obstruction or gangrene: Secondary | ICD-10-CM | POA: Diagnosis not present

## 2016-11-09 DIAGNOSIS — R221 Localized swelling, mass and lump, neck: Secondary | ICD-10-CM | POA: Diagnosis present

## 2016-11-09 DIAGNOSIS — K56600 Partial intestinal obstruction, unspecified as to cause: Secondary | ICD-10-CM | POA: Diagnosis present

## 2016-11-09 DIAGNOSIS — E118 Type 2 diabetes mellitus with unspecified complications: Secondary | ICD-10-CM | POA: Diagnosis not present

## 2016-11-09 DIAGNOSIS — R109 Unspecified abdominal pain: Secondary | ICD-10-CM | POA: Diagnosis not present

## 2016-11-09 DIAGNOSIS — E119 Type 2 diabetes mellitus without complications: Secondary | ICD-10-CM

## 2016-11-09 DIAGNOSIS — E785 Hyperlipidemia, unspecified: Secondary | ICD-10-CM | POA: Diagnosis present

## 2016-11-09 DIAGNOSIS — R112 Nausea with vomiting, unspecified: Secondary | ICD-10-CM | POA: Diagnosis not present

## 2016-11-09 DIAGNOSIS — Z01818 Encounter for other preprocedural examination: Secondary | ICD-10-CM | POA: Diagnosis not present

## 2016-11-09 DIAGNOSIS — L8992 Pressure ulcer of unspecified site, stage 2: Secondary | ICD-10-CM | POA: Diagnosis not present

## 2016-11-09 DIAGNOSIS — R220 Localized swelling, mass and lump, head: Secondary | ICD-10-CM | POA: Diagnosis not present

## 2016-11-09 DIAGNOSIS — K56699 Other intestinal obstruction unspecified as to partial versus complete obstruction: Secondary | ICD-10-CM | POA: Diagnosis not present

## 2016-11-09 DIAGNOSIS — G4733 Obstructive sleep apnea (adult) (pediatric): Secondary | ICD-10-CM | POA: Diagnosis present

## 2016-11-09 DIAGNOSIS — D170 Benign lipomatous neoplasm of skin and subcutaneous tissue of head, face and neck: Secondary | ICD-10-CM | POA: Diagnosis present

## 2016-11-09 DIAGNOSIS — I5022 Chronic systolic (congestive) heart failure: Secondary | ICD-10-CM | POA: Diagnosis present

## 2016-11-09 DIAGNOSIS — K047 Periapical abscess without sinus: Secondary | ICD-10-CM | POA: Diagnosis not present

## 2016-11-09 DIAGNOSIS — I428 Other cardiomyopathies: Secondary | ICD-10-CM | POA: Diagnosis present

## 2016-11-09 DIAGNOSIS — K045 Chronic apical periodontitis: Secondary | ICD-10-CM | POA: Diagnosis present

## 2016-11-09 DIAGNOSIS — K429 Umbilical hernia without obstruction or gangrene: Secondary | ICD-10-CM | POA: Diagnosis present

## 2016-11-09 LAB — CBC WITH DIFFERENTIAL/PLATELET
Basophils Absolute: 0 10*3/uL (ref 0.0–0.1)
Basophils Relative: 0 %
Eosinophils Absolute: 0 10*3/uL (ref 0.0–0.7)
Eosinophils Relative: 1 %
HCT: 44 % (ref 39.0–52.0)
Hemoglobin: 14.4 g/dL (ref 13.0–17.0)
Lymphocytes Relative: 10 %
Lymphs Abs: 0.5 10*3/uL — ABNORMAL LOW (ref 0.7–4.0)
MCH: 29.6 pg (ref 26.0–34.0)
MCHC: 32.7 g/dL (ref 30.0–36.0)
MCV: 90.5 fL (ref 78.0–100.0)
Monocytes Absolute: 0.3 10*3/uL (ref 0.1–1.0)
Monocytes Relative: 6 %
Neutro Abs: 4.6 10*3/uL (ref 1.7–7.7)
Neutrophils Relative %: 83 %
Platelets: 161 10*3/uL (ref 150–400)
RBC: 4.86 MIL/uL (ref 4.22–5.81)
RDW: 14.6 % (ref 11.5–15.5)
WBC: 5.5 10*3/uL (ref 4.0–10.5)

## 2016-11-09 LAB — COMPREHENSIVE METABOLIC PANEL
ALT: 25 U/L (ref 17–63)
AST: 25 U/L (ref 15–41)
Albumin: 4 g/dL (ref 3.5–5.0)
Alkaline Phosphatase: 91 U/L (ref 38–126)
Anion gap: 11 (ref 5–15)
BUN: 18 mg/dL (ref 6–20)
CO2: 23 mmol/L (ref 22–32)
Calcium: 9.4 mg/dL (ref 8.9–10.3)
Chloride: 106 mmol/L (ref 101–111)
Creatinine, Ser: 0.83 mg/dL (ref 0.61–1.24)
GFR calc Af Amer: >60 mL/min (ref >60)
GFR calc non Af Amer: >60 mL/min (ref >60)
Glucose, Bld: 161 mg/dL — ABNORMAL HIGH (ref 65–99)
Potassium: 4.4 mmol/L (ref 3.5–5.1)
Sodium: 140 mmol/L (ref 135–145)
Total Bilirubin: 0.7 mg/dL (ref 0.3–1.2)
Total Protein: 7.7 g/dL (ref 6.5–8.1)

## 2016-11-09 LAB — CBG MONITORING, ED: Glucose-Capillary: 129 mg/dL — ABNORMAL HIGH (ref 65–99)

## 2016-11-09 LAB — I-STAT CG4 LACTIC ACID, ED: Lactic Acid, Venous: 1.34 mmol/L (ref 0.5–1.9)

## 2016-11-09 LAB — PROTIME-INR
INR: 2.67
Prothrombin Time: 29 seconds — ABNORMAL HIGH (ref 11.4–15.2)

## 2016-11-09 MED ORDER — ONDANSETRON HCL 4 MG/2ML IJ SOLN
4.0000 mg | Freq: Once | INTRAMUSCULAR | Status: AC
  Administered 2016-11-09: 4 mg via INTRAVENOUS
  Filled 2016-11-09: qty 2

## 2016-11-09 MED ORDER — IOPAMIDOL (ISOVUE-300) INJECTION 61%
100.0000 mL | Freq: Once | INTRAVENOUS | Status: AC | PRN
  Administered 2016-11-09: 100 mL via INTRAVENOUS

## 2016-11-09 MED ORDER — METOCLOPRAMIDE HCL 5 MG/ML IJ SOLN
5.0000 mg | Freq: Once | INTRAMUSCULAR | Status: AC
  Administered 2016-11-09: 5 mg via INTRAVENOUS
  Filled 2016-11-09: qty 2

## 2016-11-09 MED ORDER — MORPHINE SULFATE (PF) 4 MG/ML IV SOLN
4.0000 mg | Freq: Once | INTRAVENOUS | Status: AC
  Administered 2016-11-09: 4 mg via INTRAVENOUS
  Filled 2016-11-09: qty 1

## 2016-11-09 MED ORDER — ONDANSETRON 4 MG PO TBDP: 4.0000 mg | ORAL_TABLET | Freq: Three times a day (TID) | ORAL | 0 refills | Status: DC | PRN

## 2016-11-09 NOTE — ED Notes (Signed)
Patient c/o increased pain.  Will prioritize movement.

## 2016-11-09 NOTE — ED Notes (Signed)
Pt walking in room after vomiting

## 2016-11-09 NOTE — ED Provider Notes (Signed)
[facility] DEPT Provider Note   CSN: [id number] Arrival date & time: 11/09/16  0219     History   Chief Complaint Chief Complaint  Patient presents with  . Hernia    HPI Collin [name] is a 76 y.o. male.  HPI  This is a 76 year old male with a history of atrial fibrillation, cardiomyopathy, diabetes, periumbilical hernia who presents with abdominal pain. Patient reports that his hernia has popped out and he is unable to reduce. He states that this happened after he tried to have a bowel movement. He reports recent difficulty having bowel movements and straining for hard stools. He cannot remember when his last normal bowel movement was. He reports nausea without vomiting. He states that normally his hernia is easily reducible. Pain is currently 8 out of 10. He also reports abdominal distention. No urinary symptoms or fevers.  Past Medical History:  Diagnosis Date  . Atrial fibrillation ([place])   . 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 Diabetes mellitus   . Heart disease   . Hyperlipidemia   . OSA (obstructive sleep apnea)     Patient Active Problem List   Diagnosis Date Noted  . Encounter for therapeutic drug monitoring 10/25/2013  . Diabetic peripheral neuropathy ([place]) 10/14/2011  . Hypotension 08/20/2011  . Acute renal failure ([place]) 08/20/2011  . OSA (obstructive sleep apnea) 04/15/2011  . ICD-[employer] 03/30/2011  . Chronic systolic CHF (congestive heart failure) ([place]) 03/30/2011  . Atrial fibrillation ([place]) 12/14/2010  . Nonischemic cardiomyopathy ([place]) 12/14/2010  . Type 2 diabetes mellitus ([place]) 12/14/2010  . Hyperlipidemia 12/14/2010    Past Surgical History:  Procedure Laterality Date  . CARDIAC DEFIBRILLATOR PLACEMENT  2006  . IMPLANTABLE CARDIOVERTER DEFIBRILLATOR (ICD) GENERATOR CHANGE N/A 05/10/2014   Procedure: ICD GENERATOR CHANGE;  Surgeon: [name], MD;  Location: [facility] CATH LAB;  Service: Cardiovascular;  Laterality: N/A;        Home Medications    Prior to Admission medications   Medication Sig Start Date End Date Taking? Authorizing Provider  acetaminophen (TYLENOL) 500 MG tablet Take 1,000 mg by mouth every 6 (six) hours as needed for moderate pain.    Historical Provider, MD  carvedilol (COREG) 25 MG tablet TAKE 1 TABLET TWICE DAILY WITH MEALS 06/29/16   Eulas Post, MD  digoxin (LANOXIN) 0.125 MG tablet Take 1 tablet (0.125 mg total) by mouth daily. 09/17/16   Fay Records, MD  furosemide (LASIX) 20 MG tablet Take 1 tablet (20 mg total) by mouth daily. 11/13/15   Eulas Post, MD  metFORMIN (GLUCOPHAGE) 500 MG tablet TAKE 1 TABLET TWICE DAILY WITH MEALS 01/26/16   Eulas Post, MD  pravastatin (PRAVACHOL) 40 MG tablet TAKE 1 TABLET EVERY DAY 10/26/16   Eulas Post, MD  predniSONE (DELTASONE) 20 MG tablet Take two tablets once daily for 6 days. 09/07/16   Eulas Post, MD  ramipril (ALTACE) 5 MG capsule TAKE 1 CAPSULE EVERY DAY 09/20/16   Eulas Post, MD  warfarin (COUMADIN) 5 MG tablet Take as directed by anticoagulation clinic. 06/08/16   Eulas Post, MD    Family History Family History  Problem Relation Age of Onset  . Heart disease Mother   . Sudden death Mother     under age 78  . Lupus Mother   . Hypertension Father   . Stroke Father     Social History Social History  Substance Use Topics  . Smoking status: Never Smoker  .  Smokeless tobacco: Never Used  . Alcohol use No     Allergies   Niaspan [niacin er]   Review of Systems Review of Systems  Constitutional: Negative for fever.  Respiratory: Negative for shortness of breath.   Cardiovascular: Negative for chest pain.  Gastrointestinal: Positive for abdominal pain, constipation and nausea. Negative for diarrhea and vomiting.  Genitourinary: Negative for dysuria.  All other systems reviewed and are negative.    Physical Exam Updated Vital Signs BP (!) 161/111   Pulse 77   Temp 97.5 F  (36.4 C) (Oral)   Resp 16   SpO2 96%   Physical Exam  Constitutional: He is oriented to person, place, and time. He appears well-developed and well-nourished.  HENT:  Head: Normocephalic and atraumatic.  Cardiovascular: Normal rate and normal heart sounds.   No murmur heard. Irregular rhythm  Pulmonary/Chest: Effort normal and breath sounds normal. No respiratory distress. He has no wheezes.  Abdominal: Soft. Bowel sounds are normal. He exhibits no mass. There is tenderness. There is no rebound. A hernia is present.  Palpable Periumbilical hernia, difficult to reduce, no overlying skin changes, abdomen distended, hyperactive bowel sounds  Musculoskeletal: He exhibits edema.  Neurological: He is alert and oriented to person, place, and time.  Skin: Skin is warm and dry.  Psychiatric: He has a normal mood and affect.  Nursing note and vitals reviewed.    ED Treatments / Results  Labs (all labs ordered are listed, but only abnormal results are displayed) Labs Reviewed  CBC WITH DIFFERENTIAL/PLATELET - Abnormal; Notable for the following:       Result Value   Lymphs Abs 0.5 (*)    All other components within normal limits  COMPREHENSIVE METABOLIC PANEL - Abnormal; Notable for the following:    Glucose, Bld 161 (*)    All other components within normal limits  PROTIME-INR  I-STAT CG4 LACTIC ACID, ED    EKG  EKG Interpretation None       Radiology No results found.  Procedures Procedures (including critical care time)  Medications Ordered in ED Medications  morphine 4 MG/ML injection 4 mg (not administered)  ondansetron (ZOFRAN) injection 4 mg (not administered)     Initial Impression / Assessment and Plan / ED Course  I have reviewed the triage vital signs and the nursing notes.  Pertinent labs & imaging results that were available during my care of the patient were reviewed by me and considered in my medical decision making (see chart for details).  Clinical  Course as of Nov 10 204  Tue Nov 09, 2016  0555 Patient now medicated.   Umbilical hernia reduced with firm pressure.  [CH]    Clinical Course User Index [CH] Merryl Hacker, MD    Patient presents with abdominal pain and nausea. He has a difficult to reduce umbilical hernia. There are no overlying skin changes at this time. Lab work obtained. Patient given pain and nausea medication. No obvious obstruction. After medication, hernia was reduced with firm pressure. Given difficulty with bowel movements and nausea, CT was obtained to evaluate for any bowel edema or stranding. This is pending at time of signout.  Final Clinical Impressions(s) / ED Diagnoses   Final diagnoses:  Hernia of abdominal cavity  Generalized abdominal pain    New Prescriptions New Prescriptions   No medications on file     Merryl Hacker, MD 11/10/16 0207

## 2016-11-09 NOTE — ED Notes (Signed)
Family at bedside. 

## 2016-11-09 NOTE — ED Provider Notes (Signed)
Patient awake and alert. He does have some nausea, but is tolerating oral intake fine. He, his wife and I had a lengthy conversation about the need to follow-up with general surgery for consideration of hernia repair. Patient does have other ongoing medical issues, and will pursue this, as well. With no distress, reassuring CT, labs, patient discharged in stable condition.   Carmin Muskrat, MD 11/09/16 [phone]

## 2016-11-09 NOTE — ED Notes (Signed)
ED Provider at bedside. 

## 2016-11-09 NOTE — Discharge Instructions (Signed)
As discussed, it is important that you follow up with your physician for continued management of your condition. ° °If you develop any new, or concerning changes in your condition, please return to the emergency department immediately. °

## 2016-11-09 NOTE — ED Triage Notes (Signed)
Pt states hx of hernia. Pt states increased distention and pain tonight. Pt with visible abdominal hernia at triage.

## 2016-11-09 NOTE — ED Notes (Signed)
Spoke to patient, explained reason for now oral meds, and patient updated on delays.

## 2016-11-10 ENCOUNTER — Other Ambulatory Visit: Payer: Self-pay

## 2016-11-10 ENCOUNTER — Inpatient Hospital Stay (HOSPITAL_COMMUNITY)
Admission: EM | Admit: 2016-11-10 | Discharge: 2016-11-17 | DRG: 357 | Disposition: A | Discharge status: Home / Self Care | Payer: Medicare HMO | Attending: Internal Medicine | Admitting: Internal Medicine

## 2016-11-10 ENCOUNTER — Ambulatory Visit
Admission: RE | Admit: 2016-11-10 | Discharge: 2016-11-10 | Disposition: A | Discharge status: Home / Self Care | Payer: Medicare HMO | Source: Ambulatory Visit | Attending: Otolaryngology | Admitting: Otolaryngology

## 2016-11-10 ENCOUNTER — Other Ambulatory Visit (HOSPITAL_COMMUNITY): Payer: Self-pay | Admitting: Otolaryngology

## 2016-11-10 ENCOUNTER — Other Ambulatory Visit: Payer: Self-pay | Admitting: Otolaryngology

## 2016-11-10 ENCOUNTER — Encounter (HOSPITAL_COMMUNITY): Payer: Self-pay | Admitting: Anesthesiology

## 2016-11-10 ENCOUNTER — Encounter (HOSPITAL_COMMUNITY): Payer: Self-pay | Admitting: Emergency Medicine

## 2016-11-10 ENCOUNTER — Ambulatory Visit: Payer: Self-pay | Admitting: Otolaryngology

## 2016-11-10 ENCOUNTER — Emergency Department (HOSPITAL_COMMUNITY): Payer: Medicare HMO

## 2016-11-10 DIAGNOSIS — R221 Localized swelling, mass and lump, neck: Secondary | ICD-10-CM

## 2016-11-10 DIAGNOSIS — K029 Dental caries, unspecified: Secondary | ICD-10-CM | POA: Diagnosis present

## 2016-11-10 DIAGNOSIS — K06020 Generalized gingival recession, unspecified: Secondary | ICD-10-CM | POA: Diagnosis present

## 2016-11-10 DIAGNOSIS — E785 Hyperlipidemia, unspecified: Secondary | ICD-10-CM | POA: Diagnosis present

## 2016-11-10 DIAGNOSIS — R590 Localized enlarged lymph nodes: Secondary | ICD-10-CM | POA: Diagnosis present

## 2016-11-10 DIAGNOSIS — I428 Other cardiomyopathies: Secondary | ICD-10-CM

## 2016-11-10 DIAGNOSIS — K148 Other diseases of tongue: Secondary | ICD-10-CM | POA: Diagnosis present

## 2016-11-10 DIAGNOSIS — E119 Type 2 diabetes mellitus without complications: Secondary | ICD-10-CM

## 2016-11-10 DIAGNOSIS — K56600 Partial intestinal obstruction, unspecified as to cause: Principal | ICD-10-CM | POA: Diagnosis present

## 2016-11-10 DIAGNOSIS — R220 Localized swelling, mass and lump, head: Secondary | ICD-10-CM

## 2016-11-10 DIAGNOSIS — Z95810 Presence of automatic (implantable) cardiac defibrillator: Secondary | ICD-10-CM

## 2016-11-10 DIAGNOSIS — L8990 Pressure ulcer of unspecified site, unspecified stage: Secondary | ICD-10-CM | POA: Insufficient documentation

## 2016-11-10 DIAGNOSIS — C01 Malignant neoplasm of base of tongue: Secondary | ICD-10-CM

## 2016-11-10 DIAGNOSIS — E118 Type 2 diabetes mellitus with unspecified complications: Secondary | ICD-10-CM

## 2016-11-10 DIAGNOSIS — K56609 Unspecified intestinal obstruction, unspecified as to partial versus complete obstruction: Secondary | ICD-10-CM | POA: Diagnosis present

## 2016-11-10 DIAGNOSIS — D170 Benign lipomatous neoplasm of skin and subcutaneous tissue of head, face and neck: Secondary | ICD-10-CM | POA: Diagnosis present

## 2016-11-10 DIAGNOSIS — I4891 Unspecified atrial fibrillation: Secondary | ICD-10-CM | POA: Diagnosis present

## 2016-11-10 DIAGNOSIS — K045 Chronic apical periodontitis: Secondary | ICD-10-CM | POA: Diagnosis present

## 2016-11-10 DIAGNOSIS — Z6837 Body mass index (BMI) 37.0-37.9, adult: Secondary | ICD-10-CM

## 2016-11-10 DIAGNOSIS — K429 Umbilical hernia without obstruction or gangrene: Secondary | ICD-10-CM

## 2016-11-10 DIAGNOSIS — I5022 Chronic systolic (congestive) heart failure: Secondary | ICD-10-CM | POA: Diagnosis present

## 2016-11-10 DIAGNOSIS — G4733 Obstructive sleep apnea (adult) (pediatric): Secondary | ICD-10-CM | POA: Diagnosis present

## 2016-11-10 DIAGNOSIS — E669 Obesity, unspecified: Secondary | ICD-10-CM | POA: Diagnosis present

## 2016-11-10 DIAGNOSIS — I480 Paroxysmal atrial fibrillation: Secondary | ICD-10-CM | POA: Diagnosis present

## 2016-11-10 DIAGNOSIS — E1142 Type 2 diabetes mellitus with diabetic polyneuropathy: Secondary | ICD-10-CM | POA: Diagnosis present

## 2016-11-10 DIAGNOSIS — L8992 Pressure ulcer of unspecified site, stage 2: Secondary | ICD-10-CM | POA: Diagnosis not present

## 2016-11-10 DIAGNOSIS — I4819 Other persistent atrial fibrillation: Secondary | ICD-10-CM | POA: Diagnosis present

## 2016-11-10 DIAGNOSIS — Z7984 Long term (current) use of oral hypoglycemic drugs: Secondary | ICD-10-CM

## 2016-11-10 DIAGNOSIS — Z0189 Encounter for other specified special examinations: Secondary | ICD-10-CM

## 2016-11-10 DIAGNOSIS — K089 Disorder of teeth and supporting structures, unspecified: Secondary | ICD-10-CM

## 2016-11-10 DIAGNOSIS — Z79899 Other long term (current) drug therapy: Secondary | ICD-10-CM

## 2016-11-10 DIAGNOSIS — Z7901 Long term (current) use of anticoagulants: Secondary | ICD-10-CM

## 2016-11-10 LAB — CBC WITH DIFFERENTIAL/PLATELET
Basophils Absolute: 0 10*3/uL (ref 0.0–0.1)
Basophils Relative: 0 %
Eosinophils Absolute: 0 10*3/uL (ref 0.0–0.7)
Eosinophils Relative: 1 %
HCT: 48 % (ref 39.0–52.0)
Hemoglobin: 15.6 g/dL (ref 13.0–17.0)
Lymphocytes Relative: 14 %
Lymphs Abs: 0.5 10*3/uL — ABNORMAL LOW (ref 0.7–4.0)
MCH: 29.3 pg (ref 26.0–34.0)
MCHC: 32.5 g/dL (ref 30.0–36.0)
MCV: 90.2 fL (ref 78.0–100.0)
Monocytes Absolute: 0.4 10*3/uL (ref 0.1–1.0)
Monocytes Relative: 11 %
Neutro Abs: 2.6 10*3/uL (ref 1.7–7.7)
Neutrophils Relative %: 74 %
Platelets: 177 10*3/uL (ref 150–400)
RBC: 5.32 MIL/uL (ref 4.22–5.81)
RDW: 14.7 % (ref 11.5–15.5)
WBC: 3.5 10*3/uL — ABNORMAL LOW (ref 4.0–10.5)

## 2016-11-10 LAB — URINALYSIS, ROUTINE W REFLEX MICROSCOPIC
Bacteria, UA: NONE SEEN
Bilirubin Urine: NEGATIVE
Glucose, UA: NEGATIVE mg/dL
Ketones, ur: 20 mg/dL — AB
Leukocytes, UA: NEGATIVE
Nitrite: NEGATIVE
Protein, ur: 100 mg/dL — AB
Specific Gravity, Urine: >1.046 — ABNORMAL HIGH (ref 1.005–1.030)
pH: 5 (ref 5.0–8.0)

## 2016-11-10 LAB — COMPREHENSIVE METABOLIC PANEL
ALT: 23 U/L (ref 17–63)
AST: 20 U/L (ref 15–41)
Albumin: 4 g/dL (ref 3.5–5.0)
Alkaline Phosphatase: 84 U/L (ref 38–126)
Anion gap: 10 (ref 5–15)
BUN: 25 mg/dL — ABNORMAL HIGH (ref 6–20)
CO2: 27 mmol/L (ref 22–32)
Calcium: 9 mg/dL (ref 8.9–10.3)
Chloride: 101 mmol/L (ref 101–111)
Creatinine, Ser: 0.97 mg/dL (ref 0.61–1.24)
GFR calc Af Amer: >60 mL/min (ref >60)
GFR calc non Af Amer: >60 mL/min (ref >60)
Glucose, Bld: 165 mg/dL — ABNORMAL HIGH (ref 65–99)
Potassium: 3.9 mmol/L (ref 3.5–5.1)
Sodium: 138 mmol/L (ref 135–145)
Total Bilirubin: 1.1 mg/dL (ref 0.3–1.2)
Total Protein: 8.3 g/dL — ABNORMAL HIGH (ref 6.5–8.1)

## 2016-11-10 LAB — I-STAT TROPONIN, ED: Troponin i, poc: 0.01 ng/mL (ref 0.00–0.08)

## 2016-11-10 LAB — LIPASE, BLOOD: Lipase: 24 U/L (ref 11–51)

## 2016-11-10 LAB — PROTIME-INR
INR: 2.95
Prothrombin Time: 31.4 seconds — ABNORMAL HIGH (ref 11.4–15.2)

## 2016-11-10 LAB — I-STAT CG4 LACTIC ACID, ED: Lactic Acid, Venous: 1.27 mmol/L (ref 0.5–1.9)

## 2016-11-10 MED ORDER — SODIUM CHLORIDE 0.9 % IV BOLUS (SEPSIS)
500.0000 mL | Freq: Once | INTRAVENOUS | Status: AC
  Administered 2016-11-10: 500 mL via INTRAVENOUS

## 2016-11-10 MED ORDER — MILK AND MOLASSES ENEMA
1.0000 | Freq: Once | RECTAL | Status: DC
  Filled 2016-11-10: qty 250

## 2016-11-10 MED ORDER — DILTIAZEM HCL 25 MG/5ML IV SOLN
15.0000 mg | Freq: Once | INTRAVENOUS | Status: AC
  Administered 2016-11-11: 15 mg via INTRAVENOUS
  Filled 2016-11-10: qty 5

## 2016-11-10 MED ORDER — IOPAMIDOL (ISOVUE-300) INJECTION 61%
75.0000 mL | Freq: Once | INTRAVENOUS | Status: AC | PRN
  Administered 2016-11-10: 75 mL via INTRAVENOUS

## 2016-11-10 MED ORDER — PROMETHAZINE HCL 25 MG/ML IJ SOLN
12.5000 mg | Freq: Once | INTRAMUSCULAR | Status: AC
  Administered 2016-11-10: 12.5 mg via INTRAVENOUS
  Filled 2016-11-10: qty 1

## 2016-11-10 MED ORDER — SODIUM CHLORIDE 0.9 % IV SOLN
Freq: Once | INTRAVENOUS | Status: AC
  Administered 2016-11-11: 01:00:00 via INTRAVENOUS

## 2016-11-10 NOTE — Telephone Encounter (Signed)
errror

## 2016-11-10 NOTE — H&P (Signed)
PREOPERATIVE H&P  Chief Complaint: right neck mass  HPI: Collin [name] is a 76 y.o. male who presents for evaluation of enlarging right neck mass. On exam patient has several enlarged right neck nodes as well as a large right shoulder lipoma. 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 He's taken to the OR for DL/bx and excision of right shoulder Lipoma.  Past Medical History:  Diagnosis Date  . Atrial fibrillation ([place])   . 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 Diabetes mellitus   . Heart disease   . Hyperlipidemia   . OSA (obstructive sleep apnea)    Past Surgical History:  Procedure Laterality Date  . CARDIAC DEFIBRILLATOR PLACEMENT  2006  . IMPLANTABLE CARDIOVERTER DEFIBRILLATOR (ICD) GENERATOR CHANGE N/A 05/10/2014   Procedure: ICD GENERATOR CHANGE;  Surgeon: [name], MD;  Location: [facility] CATH LAB;  Service: Cardiovascular;  Laterality: N/A;   Social History   Social History  . Marital status: Married    Spouse name: N/A  . Number of children: N/A  . Years of education: N/A   Social History Main Topics  . Smoking status: Never Smoker  . Smokeless tobacco: Never Used  . Alcohol use No  . Drug use: No  . Sexual activity: Not on file   Other Topics Concern  . Not on file   Social History Narrative  . No narrative on file   Family History  Problem Relation Age of Onset  . Heart disease Mother   . Sudden death Mother     under age 90  . Lupus Mother   . Hypertension Father   . Stroke Father    Allergies  Allergen Reactions  . Niaspan [Niacin Er] Other (See Comments)    flushes   Prior to Admission medications   Medication Sig Start Date End Date Taking? Authorizing Provider  acetaminophen (TYLENOL) 500 MG tablet Take 1,000 mg by mouth every 6 (six) hours as needed for moderate pain.    Historical Provider, MD  carvedilol (COREG) 25 MG tablet TAKE 1 TABLET TWICE DAILY WITH MEALS 06/29/16   Eulas Post, MD  digoxin  (LANOXIN) 0.125 MG tablet Take 1 tablet (0.125 mg total) by mouth daily. 09/17/16   Fay Records, MD  furosemide (LASIX) 20 MG tablet Take 1 tablet (20 mg total) by mouth daily. Patient taking differently: Take 20 mg by mouth daily as needed for fluid.  11/13/15   Eulas Post, MD  metFORMIN (GLUCOPHAGE) 500 MG tablet TAKE 1 TABLET TWICE DAILY WITH MEALS 01/26/16   Eulas Post, MD  ondansetron (ZOFRAN ODT) 4 MG disintegrating tablet Take 1 tablet (4 mg total) by mouth every 8 (eight) hours as needed for nausea or vomiting. 11/09/16   Carmin Muskrat, MD  pravastatin (PRAVACHOL) 40 MG tablet TAKE 1 TABLET EVERY DAY 10/26/16   Eulas Post, MD  ramipril (ALTACE) 5 MG capsule TAKE 1 CAPSULE EVERY DAY 09/20/16   Eulas Post, MD  warfarin (COUMADIN) 5 MG tablet Take as directed by anticoagulation clinic. Patient taking differently: Take 5-7.5 mg by mouth daily. Take 5mg  daily except take 7.5mg  on Monday. 06/08/16   Eulas Post, MD     Positive ROS: mild sore throat  All other systems have been reviewed and were otherwise negative with the exception of those mentioned in the HPI and as above.  Physical Exam: There were no vitals filed for this visit.  General:  Alert, no acute distress Oral: Normal oral mucosa and tonsils IDL lesion on mid BOT just above the epiglottis Nasal: Clear nasal passages Neck: Large 4 cm right submandibular mass with several firm hard mid jugular nodes,  8 cm right shoulder lipoma Ear: Ear canal is clear with normal appearing TMs Cardiovascular: IRR, no murmur. Pacemaker/defibrilator in place Respiratory: Clear to auscultation Neurologic: Alert and oriented x 3   Assessment/Plan: NEOPLASM OF NECK Plan for Procedure(s): DIRECT LARYNGOSCOPY AND BIOPSY EXCISION RIGHT SHOULDER LIPOMA POSSIBLE EXCISION RIGHT NECK NODE   Melony Overly, MD 11/10/2016 9:36 AM

## 2016-11-10 NOTE — ED Notes (Signed)
Patient transported to X-ray 

## 2016-11-10 NOTE — ED Provider Notes (Signed)
[facility] DEPT Provider Note   CSN: [id number] Arrival date & time: 11/10/16  2118     History   Chief Complaint Chief Complaint  Patient presents with  . Emesis    HPI Collin [name] is a 76 y.o. male.  HPI Collin [name] is a 76 y.o. male with history of A. fib, diabetes, coronary disease, recently diagnosed tongue and neck mass, presents to emergency department complaining of nausea and vomiting. Patient states his symptoms began 2 days ago. She was seen in emergency department at time for possible incarcerated hernia. At that time her knee was reduced, he had unremarkable CT abdomen and pelvis. He states he felt better the next day, and today was scheduled for CT of his neck. Further evaluation of recently diagnosed mass. He states after the CT scan and receiving intravenous contrast, he developed recurrent nausea and vomiting. He states he did drink a large amount of fluid after the contrast as directed by the staff. He states since then he hasn't been able to keep anything down. He states he is vomiting green dark emesis. He denies any abdominal pain but states he feels like his abdomen is distended and states he feels constipated. He reports he has not had a normal bowel movement in 2 weeks. He has tried Dulcolax for constipation yesterday and this morning with no relief of his symptoms. He denies any blood in his emesis. He denies any rectal pain or pressure. Nasal fever or chills. Denies any chest pain or shortness of breath. He states he was able to take his morning medications.   Past Medical History:  Diagnosis Date  . Atrial fibrillation ([place])   . 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 Diabetes mellitus   . Heart disease   . Hyperlipidemia   . OSA (obstructive sleep apnea)     Patient Active Problem List   Diagnosis Date Noted  . Encounter for therapeutic drug monitoring 10/25/2013  . Diabetic peripheral neuropathy ([place]) 10/14/2011  . Hypotension  08/20/2011  . Acute renal failure ([place]) 08/20/2011  . OSA (obstructive sleep apnea) 04/15/2011  . ICD-[employer] 03/30/2011  . Chronic systolic CHF (congestive heart failure) ([place]) 03/30/2011  . Atrial fibrillation ([place]) 12/14/2010  . Nonischemic cardiomyopathy ([employer]) 12/14/2010  . Type 2 diabetes mellitus ([place]) 12/14/2010  . Hyperlipidemia 12/14/2010    Past Surgical History:  Procedure Laterality Date  . CARDIAC DEFIBRILLATOR PLACEMENT  2006  . IMPLANTABLE CARDIOVERTER DEFIBRILLATOR (ICD) GENERATOR CHANGE N/A 05/10/2014   Procedure: ICD GENERATOR CHANGE;  Surgeon: [name], MD;  Location: [facility] CATH LAB;  Service: Cardiovascular;  Laterality: N/A;       Home Medications    Prior to Admission medications   Medication Sig Start Date End Date Taking? Authorizing Provider  acetaminophen (TYLENOL) 500 MG tablet Take 1,000 mg by mouth every 6 (six) hours as needed for moderate pain.    Historical Provider, MD  carvedilol (COREG) 25 MG tablet TAKE 1 TABLET TWICE DAILY WITH MEALS 06/29/16   Eulas Post, MD  digoxin (LANOXIN) 0.125 MG tablet Take 1 tablet (0.125 mg total) by mouth daily. 09/17/16   Fay Records, MD  furosemide (LASIX) 20 MG tablet Take 1 tablet (20 mg total) by mouth daily. Patient taking differently: Take 20 mg by mouth daily as needed for fluid.  11/13/15   Eulas Post, MD  metFORMIN (GLUCOPHAGE) 500 MG tablet TAKE 1 TABLET TWICE DAILY WITH MEALS 01/26/16   Eulas Post, MD  ondansetron (ZOFRAN ODT) 4 MG disintegrating tablet Take 1 tablet (4 mg total) by mouth every 8 (eight) hours as needed for nausea or vomiting. 11/09/16   Carmin Muskrat, MD  pravastatin (PRAVACHOL) 40 MG tablet TAKE 1 TABLET EVERY DAY 10/26/16   Eulas Post, MD  ramipril (ALTACE) 5 MG capsule TAKE 1 CAPSULE EVERY DAY 09/20/16   Eulas Post, MD  warfarin (COUMADIN) 5 MG tablet Take as directed by anticoagulation clinic. Patient taking differently: Take 5-7.5 mg by mouth  daily. Take 5mg  daily except take 7.5mg  on Monday. 06/08/16   Eulas Post, MD    Family History Family History  Problem Relation Age of Onset  . Heart disease Mother   . Sudden death Mother     under age 98  . Lupus Mother   . Hypertension Father   . Stroke Father     Social History Social History  Substance Use Topics  . Smoking status: Never Smoker  . Smokeless tobacco: Never Used  . Alcohol use No     Allergies   Niaspan [niacin er]   Review of Systems Review of Systems  Constitutional: Negative for chills and fever.  Respiratory: Negative for cough, chest tightness and shortness of breath.   Cardiovascular: Negative for chest pain, palpitations and leg swelling.  Gastrointestinal: Positive for abdominal distention, abdominal pain, constipation, nausea and vomiting. Negative for blood in stool and diarrhea.  Genitourinary: Negative for dysuria, frequency, hematuria and urgency.  Musculoskeletal: Negative for arthralgias, myalgias, neck pain and neck stiffness.  Skin: Negative for rash.  Allergic/Immunologic: Negative for immunocompromised state.  Neurological: Negative for dizziness, weakness, light-headedness, numbness and headaches.  All other systems reviewed and are negative.    Physical Exam Updated Vital Signs BP 148/97 (BP Location: Right Arm)   Pulse 71   Temp 97.8 F (36.6 C) (Oral)   Resp 20   Ht 6' (1.829 m)   Wt 131.5 kg   SpO2 98%   BMI 39.33 kg/m   Physical Exam  Constitutional: He is oriented to person, place, and time. He appears well-developed and well-nourished. No distress.  HENT:  Head: Normocephalic and atraumatic.  Eyes: Conjunctivae are normal.  Neck: Neck supple.  Cardiovascular: Regular rhythm and normal heart sounds.   tachycardic  Pulmonary/Chest: Effort normal. No respiratory distress. He has no wheezes. He has no rales.  Abdominal: Soft. Bowel sounds are normal. He exhibits distension. He exhibits no mass. There is  tenderness. There is no rebound and no guarding.  Diffuse tenderness  Musculoskeletal: He exhibits no edema.  Neurological: He is alert and oriented to person, place, and time.  Skin: Skin is warm and dry.  Nursing note and vitals reviewed.    ED Treatments / Results  Labs (all labs ordered are listed, but only abnormal results are displayed) Labs Reviewed  CBC WITH DIFFERENTIAL/PLATELET - Abnormal; Notable for the following:       Result Value   WBC 3.5 (*)    Lymphs Abs 0.5 (*)    All other components within normal limits  URINALYSIS, ROUTINE W REFLEX MICROSCOPIC - Abnormal; Notable for the following:    Color, Urine AMBER (*)    Specific Gravity, Urine >1.046 (*)    Hgb urine dipstick SMALL (*)    Ketones, ur 20 (*)    Protein, ur 100 (*)    Squamous Epithelial / LPF 0-5 (*)    All other components within normal limits  COMPREHENSIVE METABOLIC PANEL  LIPASE, BLOOD  OCCULT BLOOD GASTRIC / DUODENUM (SPECIMEN CUP)  DIGOXIN LEVEL  PROTIME-INR  I-STAT TROPOININ, ED  I-STAT CG4 LACTIC ACID, ED    EKG  EKG Interpretation None       Radiology Ct Soft Tissue Neck W Contrast  Result Date: 11/10/2016 CLINICAL DATA:  76 y/o  M; right neck mass. EXAM: CT NECK WITH CONTRAST TECHNIQUE: Multidetector CT imaging of the neck was performed using the standard protocol following the bolus administration of intravenous contrast. CONTRAST:  35mL ISOVUE-300 IOPAMIDOL (ISOVUE-300) INJECTION 61% COMPARISON:  None. FINDINGS: Pharynx and larynx: Right base of tongue mass measuring 36 x 32 x 37 mm (AP x ML x CC series 3, image 37 and series 200, image 56). The mass effaces the right vallecula glossopharyngeal sulcus and inferiorly abuts the hyoid bone. The mass invades the tongue 27 mm and extends approximately 9 mm into the contralateral base of tongue. Salivary glands: No inflammation, mass, or stone. Thyroid: Subcentimeter low hypodense foci probably represent nodules. Lymph nodes: Necrotic  lymphadenopathy in the right level 2, 3, 4, and 5a/b stations. Left-sided cervical necrotic lymphadenopathy at the level 2 station. The largest lymph node in the right level 2 station measures 41 x 39 x 49 mm (AP x ML x CC). There is stranding surrounding the right upper cervical lymph nodes with irregular margins for example (series 200, image 39) and an indistinct border between the right submandibular gland and adjacent adenopathy (series 3, image 47) indicating probable extra nodal extension. Vascular: Patent carotid and vertebral arteries of the neck. Patent internal jugular veins. Moderate calcific atherosclerosis of carotid bifurcations without high-grade stenosis. Brief submucosal retropharyngeal course of proximal ICA bilaterally. Limited intracranial: Negative. Visualized orbits: Not within the field of view. Mastoids and visualized paranasal sinuses: Right greater than left maxillary sinus mucosal thickening. Skeleton: No acute fracture or suspicious osseous lesion. Moderate cervical spondylosis with predominantly discogenic degenerative changes. No high-grade bony canal stenosis. Upper chest: Aortic atherosclerosis with moderate calcification. Other: None. IMPRESSION: 1. 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 The mass measures up to 37 mm. Visible mass invades base of tongue 27 mm and extends 9 mm to contralateral base of tongue. 2. Right level 2, 3, 4, 5a/b necrotic lymphadenopathy with findings of extra nodal extension. Left level 2 lymphadenopathy. These results will be called to the ordering clinician or representative by the Radiologist Assistant, and communication documented in the PACS or zVision Dashboard. Electronically Signed   By: Kristine Garbe M.D.   On: 11/10/2016 15:22   Ct Abdomen Pelvis W Contrast  Result Date: 11/09/2016 CLINICAL DATA:  Abdominal pain.  Recurrent umbilical hernia. EXAM: CT ABDOMEN AND PELVIS WITH CONTRAST TECHNIQUE: Multidetector CT  imaging of the abdomen and pelvis was performed using the standard protocol following bolus administration of intravenous contrast. 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 Hepatobiliary: The liver parenchyma is normal. Single 17 mm calcified gallstone. Gallbladder wall is not thickened. No dilated bile ducts. 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 Pancreas: Unremarkable. No pancreatic ductal dilatation or surrounding inflammatory changes. Spleen: Normal in size without focal abnormality other than a single calcified granuloma. Adrenals/Urinary Tract: Adrenal glands are unremarkable. Kidneys are normal, without renal calculi, focal lesion, or  hydronephrosis. Bladder is unremarkable. Stomach/Bowel: Stomach is within normal limits. Appendix appears normal. No evidence of bowel wall thickening, distention, or inflammatory changes. Vascular/Lymphatic: Extensive aortic atherosclerosis. No adenopathy. 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 Reproductive: Prostate is unremarkable. Other: There is a 7.4 cm umbilical hernia containing only fat. No bowel within the hernia. Musculoskeletal: No acute or significant osseous findings. IMPRESSION: 1. Umbilical hernia containing only peritoneal fat. No bowel obstruction. 2. Chronic cholelithiasis. A gallstone is present on the prior study of 06/10/2014. 3. 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 Electronically Signed   By: Lorriane Shire M.D.   On: 11/09/2016 08:10    Procedures Procedures (including critical care time)  Medications Ordered in ED Medications  diltiazem (CARDIZEM) 100 mg in dextrose 5 % 100 mL (1 mg/mL)  infusion (10 mg/hr Intravenous New Bag/Given 11/11/16 2106)  hydrALAZINE (APRESOLINE) injection 10 mg (not administered)  acetaminophen (TYLENOL) tablet 650 mg (not administered)    Or  acetaminophen (TYLENOL) suppository 650 mg (not administered)  insulin aspart (novoLOG) injection 0-9 Units (0 Units Subcutaneous Not Given 11/11/16 2316)  0.9 %  sodium chloride infusion ( Intravenous New Bag/Given 11/11/16 2106)  chlorhexidine (PERIDEX) 0.12 % solution 15 mL (15 mLs Mouth Rinse Given 11/11/16 2105)  MEDLINE mouth rinse (15 mLs Mouth Rinse Given 11/11/16 1646)  sodium chloride 0.9 % bolus 500 mL (0 mLs Intravenous Stopped 11/11/16 0050)  promethazine (PHENERGAN) injection 12.5 mg (12.5 mg Intravenous Given 11/10/16 2233)  diltiazem (CARDIZEM) injection 15 mg (15 mg Intravenous Given 11/11/16 0009)  0.9 %  sodium chloride infusion ( Intravenous Stopped 11/11/16 0301)  pantoprazole (PROTONIX) 80 mg in sodium chloride 0.9 % 100 mL IVPB (0 mg Intravenous Stopped 11/11/16 0210)  promethazine (PHENERGAN) injection 12.5 mg (12.5 mg Intravenous Given 11/11/16 0135)  diltiazem (CARDIZEM) injection 10 mg (10 mg Intravenous Given 11/11/16 0145)  diatrizoate meglumine-sodium (GASTROGRAFIN) 66-10 % solution 90 mL (90 mLs Per NG tube Given 11/11/16 1200)     Initial Impression / Assessment and Plan / ED Course  I have reviewed the triage vital signs and the nursing notes.  Pertinent labs & imaging results that were available during my care of the patient were reviewed by me and considered in my medical decision making (see chart for details).     Pt with abdominal pressure, distention, nausea, vomiting. Pt had unremarkable CT two days ago. WIl repeat labs to evaluate for electrolyte disturbances, renal failure in setting of IV contrast two days in a row, get acute abdomen. Enema ordered. Pt is found to be tachycardic in afib, will give small fluid bolus, get ecg and trop.    Xray consistent with SBO. Will place NG tube Spoke  with medicine will admit, asked for surgery consult.    12:52 AM Spoke with Dr. Darrol Poke, advised to hold coumadin, place NG tube, admit, they will consult.   NG tube put out 1700cc of dark green secretions. Pt feels better. Pt's heart rate gradually increasing, [id number]. No improvement with IV fluid bolus and cardizem 15cc, discussed with Dr. Claudine Mouton, will give another bolus of cardizem and if not improving will start on drip.   Discussed with Dr. Cherylann Banas, will place on drip, change bed to step down.   Final Clinical Impressions(s) / ED Diagnoses   Final diagnoses:  Small bowel obstruction    New Prescriptions Current Discharge Medication List       Jeannett Senior, PA-C 11/12/16 0127    Isla Pence,  MD 11/13/16 [phone]

## 2016-11-10 NOTE — ED Triage Notes (Addendum)
Pt to ED with c/o nausea and vomiting since after having CT earlier today.  Pt also c/o no BM iin approx 1 week.  Unable to keep anything down.  Abd distended and firm.  Pt has taken Zofran without relief

## 2016-11-11 ENCOUNTER — Encounter (HOSPITAL_COMMUNITY): Payer: Self-pay | Admitting: Internal Medicine

## 2016-11-11 ENCOUNTER — Inpatient Hospital Stay (HOSPITAL_COMMUNITY): Payer: Medicare HMO

## 2016-11-11 ENCOUNTER — Emergency Department (HOSPITAL_COMMUNITY): Payer: Medicare HMO

## 2016-11-11 DIAGNOSIS — G4733 Obstructive sleep apnea (adult) (pediatric): Secondary | ICD-10-CM | POA: Diagnosis present

## 2016-11-11 DIAGNOSIS — R221 Localized swelling, mass and lump, neck: Secondary | ICD-10-CM | POA: Diagnosis present

## 2016-11-11 DIAGNOSIS — D170 Benign lipomatous neoplasm of skin and subcutaneous tissue of head, face and neck: Secondary | ICD-10-CM | POA: Diagnosis present

## 2016-11-11 DIAGNOSIS — R112 Nausea with vomiting, unspecified: Secondary | ICD-10-CM | POA: Diagnosis present

## 2016-11-11 DIAGNOSIS — I5022 Chronic systolic (congestive) heart failure: Secondary | ICD-10-CM | POA: Diagnosis present

## 2016-11-11 DIAGNOSIS — Z7984 Long term (current) use of oral hypoglycemic drugs: Secondary | ICD-10-CM | POA: Diagnosis not present

## 2016-11-11 DIAGNOSIS — Z95810 Presence of automatic (implantable) cardiac defibrillator: Secondary | ICD-10-CM | POA: Diagnosis not present

## 2016-11-11 DIAGNOSIS — E1142 Type 2 diabetes mellitus with diabetic polyneuropathy: Secondary | ICD-10-CM | POA: Diagnosis present

## 2016-11-11 DIAGNOSIS — K56600 Partial intestinal obstruction, unspecified as to cause: Secondary | ICD-10-CM | POA: Diagnosis present

## 2016-11-11 DIAGNOSIS — R590 Localized enlarged lymph nodes: Secondary | ICD-10-CM | POA: Diagnosis present

## 2016-11-11 DIAGNOSIS — L8990 Pressure ulcer of unspecified site, unspecified stage: Secondary | ICD-10-CM | POA: Diagnosis present

## 2016-11-11 DIAGNOSIS — K429 Umbilical hernia without obstruction or gangrene: Secondary | ICD-10-CM | POA: Diagnosis present

## 2016-11-11 DIAGNOSIS — R220 Localized swelling, mass and lump, head: Secondary | ICD-10-CM | POA: Diagnosis not present

## 2016-11-11 DIAGNOSIS — E785 Hyperlipidemia, unspecified: Secondary | ICD-10-CM | POA: Diagnosis present

## 2016-11-11 DIAGNOSIS — Z79899 Other long term (current) drug therapy: Secondary | ICD-10-CM | POA: Diagnosis not present

## 2016-11-11 DIAGNOSIS — K06020 Generalized gingival recession, unspecified: Secondary | ICD-10-CM | POA: Diagnosis present

## 2016-11-11 DIAGNOSIS — K029 Dental caries, unspecified: Secondary | ICD-10-CM | POA: Diagnosis present

## 2016-11-11 DIAGNOSIS — K045 Chronic apical periodontitis: Secondary | ICD-10-CM | POA: Diagnosis present

## 2016-11-11 DIAGNOSIS — L8992 Pressure ulcer of unspecified site, stage 2: Secondary | ICD-10-CM | POA: Diagnosis not present

## 2016-11-11 DIAGNOSIS — C01 Malignant neoplasm of base of tongue: Secondary | ICD-10-CM | POA: Diagnosis present

## 2016-11-11 DIAGNOSIS — I4891 Unspecified atrial fibrillation: Secondary | ICD-10-CM | POA: Diagnosis present

## 2016-11-11 DIAGNOSIS — Z6837 Body mass index (BMI) 37.0-37.9, adult: Secondary | ICD-10-CM | POA: Diagnosis not present

## 2016-11-11 DIAGNOSIS — I428 Other cardiomyopathies: Secondary | ICD-10-CM | POA: Diagnosis present

## 2016-11-11 DIAGNOSIS — Z7901 Long term (current) use of anticoagulants: Secondary | ICD-10-CM | POA: Diagnosis not present

## 2016-11-11 DIAGNOSIS — E118 Type 2 diabetes mellitus with unspecified complications: Secondary | ICD-10-CM | POA: Diagnosis not present

## 2016-11-11 DIAGNOSIS — K56609 Unspecified intestinal obstruction, unspecified as to partial versus complete obstruction: Secondary | ICD-10-CM | POA: Diagnosis present

## 2016-11-11 DIAGNOSIS — Z01818 Encounter for other preprocedural examination: Secondary | ICD-10-CM | POA: Diagnosis not present

## 2016-11-11 DIAGNOSIS — E669 Obesity, unspecified: Secondary | ICD-10-CM | POA: Diagnosis present

## 2016-11-11 LAB — CBC WITH DIFFERENTIAL/PLATELET
Basophils Absolute: 0 10*3/uL (ref 0.0–0.1)
Basophils Relative: 0 %
Eosinophils Absolute: 0 10*3/uL (ref 0.0–0.7)
Eosinophils Relative: 1 %
HCT: 43.9 % (ref 39.0–52.0)
Hemoglobin: 14.2 g/dL (ref 13.0–17.0)
Lymphocytes Relative: 20 %
Lymphs Abs: 0.5 10*3/uL — ABNORMAL LOW (ref 0.7–4.0)
MCH: 29.3 pg (ref 26.0–34.0)
MCHC: 32.3 g/dL (ref 30.0–36.0)
MCV: 90.7 fL (ref 78.0–100.0)
Monocytes Absolute: 0.4 10*3/uL (ref 0.1–1.0)
Monocytes Relative: 18 %
Neutro Abs: 1.5 10*3/uL — ABNORMAL LOW (ref 1.7–7.7)
Neutrophils Relative %: 61 %
Platelets: 164 10*3/uL (ref 150–400)
RBC: 4.84 MIL/uL (ref 4.22–5.81)
RDW: 14.8 % (ref 11.5–15.5)
WBC: 2.5 10*3/uL — ABNORMAL LOW (ref 4.0–10.5)

## 2016-11-11 LAB — CBG MONITORING, ED
Glucose-Capillary: 129 mg/dL — ABNORMAL HIGH (ref 65–99)
Glucose-Capillary: 135 mg/dL — ABNORMAL HIGH (ref 65–99)
Glucose-Capillary: 127 mg/dL — ABNORMAL HIGH (ref 65–99)

## 2016-11-11 LAB — COMPREHENSIVE METABOLIC PANEL
ALT: 20 U/L (ref 17–63)
AST: 18 U/L (ref 15–41)
Albumin: 3.6 g/dL (ref 3.5–5.0)
Alkaline Phosphatase: 72 U/L (ref 38–126)
Anion gap: 10 (ref 5–15)
BUN: 22 mg/dL — ABNORMAL HIGH (ref 6–20)
CO2: 25 mmol/L (ref 22–32)
Calcium: 8 mg/dL — ABNORMAL LOW (ref 8.9–10.3)
Chloride: 105 mmol/L (ref 101–111)
Creatinine, Ser: 0.88 mg/dL (ref 0.61–1.24)
GFR calc Af Amer: >60 mL/min (ref >60)
GFR calc non Af Amer: >60 mL/min (ref >60)
Glucose, Bld: 134 mg/dL — ABNORMAL HIGH (ref 65–99)
Potassium: 3.7 mmol/L (ref 3.5–5.1)
Sodium: 140 mmol/L (ref 135–145)
Total Bilirubin: 0.9 mg/dL (ref 0.3–1.2)
Total Protein: 7 g/dL (ref 6.5–8.1)

## 2016-11-11 LAB — PROTIME-INR
INR: 3
Prothrombin Time: 31.8 seconds — ABNORMAL HIGH (ref 11.4–15.2)

## 2016-11-11 LAB — MRSA PCR SCREENING: MRSA by PCR: NEGATIVE

## 2016-11-11 LAB — TSH: TSH: 0.415 u[IU]/mL (ref 0.350–4.500)

## 2016-11-11 LAB — GLUCOSE, CAPILLARY
Glucose-Capillary: 121 mg/dL — ABNORMAL HIGH (ref 65–99)
Glucose-Capillary: 118 mg/dL — ABNORMAL HIGH (ref 65–99)
Glucose-Capillary: 118 mg/dL — ABNORMAL HIGH (ref 65–99)

## 2016-11-11 LAB — OCCULT BLOOD GASTRIC / DUODENUM (SPECIMEN CUP): Occult Blood, Gastric: POSITIVE — AB

## 2016-11-11 LAB — DIGOXIN LEVEL: Digoxin Level: <0.2 ng/mL — ABNORMAL LOW (ref 0.8–2.0)

## 2016-11-11 LAB — I-STAT CG4 LACTIC ACID, ED: Lactic Acid, Venous: 1.12 mmol/L (ref 0.5–1.9)

## 2016-11-11 MED ORDER — DILTIAZEM HCL 25 MG/5ML IV SOLN
10.0000 mg | Freq: Once | INTRAVENOUS | Status: AC
  Administered 2016-11-11: 10 mg via INTRAVENOUS
  Filled 2016-11-11: qty 5

## 2016-11-11 MED ORDER — DEXTROSE 5 % IV SOLN
3.0000 g | INTRAVENOUS | Status: DC
  Filled 2016-11-11: qty 3000

## 2016-11-11 MED ORDER — CHLORHEXIDINE GLUCONATE 0.12 % MT SOLN
15.0000 mL | Freq: Two times a day (BID) | OROMUCOSAL | Status: DC
  Administered 2016-11-11 – 2016-11-15 (×9): 15 mL via OROMUCOSAL
  Filled 2016-11-11 (×5): qty 15

## 2016-11-11 MED ORDER — SODIUM CHLORIDE 0.9 % IV SOLN
INTRAVENOUS | Status: AC
  Administered 2016-11-11 (×2): via INTRAVENOUS

## 2016-11-11 MED ORDER — ACETAMINOPHEN 325 MG PO TABS: 650.0000 mg | ORAL_TABLET | Freq: Four times a day (QID) | ORAL | Status: DC | PRN

## 2016-11-11 MED ORDER — SODIUM CHLORIDE 0.9 % IV SOLN
80.0000 mg | Freq: Once | INTRAVENOUS | Status: AC
  Administered 2016-11-11: 80 mg via INTRAVENOUS
  Filled 2016-11-11: qty 80

## 2016-11-11 MED ORDER — INSULIN ASPART 100 UNIT/ML ~~LOC~~ SOLN
0.0000 [IU] | SUBCUTANEOUS | Status: DC
  Administered 2016-11-11 – 2016-11-12 (×5): 1 [IU] via SUBCUTANEOUS
  Filled 2016-11-11 (×2): qty 1

## 2016-11-11 MED ORDER — PROMETHAZINE HCL 25 MG/ML IJ SOLN
12.5000 mg | Freq: Once | INTRAMUSCULAR | Status: AC
  Administered 2016-11-11: 12.5 mg via INTRAVENOUS
  Filled 2016-11-11: qty 1

## 2016-11-11 MED ORDER — DILTIAZEM HCL 100 MG IV SOLR
5.0000 mg/h | INTRAVENOUS | Status: DC
  Administered 2016-11-11 (×2): 10 mg/h via INTRAVENOUS
  Administered 2016-11-11: 5 mg/h via INTRAVENOUS
  Administered 2016-11-12: 10 mg/h via INTRAVENOUS
  Administered 2016-11-12: 5 mg/h via INTRAVENOUS
  Administered 2016-11-13: 7.5 mg/h via INTRAVENOUS
  Filled 2016-11-11 (×8): qty 100

## 2016-11-11 MED ORDER — DIATRIZOATE MEGLUMINE & SODIUM 66-10 % PO SOLN
90.0000 mL | Freq: Once | ORAL | Status: AC
  Administered 2016-11-11: 90 mL via NASOGASTRIC
  Filled 2016-11-11 (×2): qty 90

## 2016-11-11 MED ORDER — ACETAMINOPHEN 650 MG RE SUPP
650.0000 mg | Freq: Four times a day (QID) | RECTAL | Status: DC | PRN
Start: 2016-11-11 — End: 2016-11-16

## 2016-11-11 MED ORDER — HYDRALAZINE HCL 20 MG/ML IJ SOLN: 10.0000 mg | INTRAMUSCULAR | Status: DC | PRN

## 2016-11-11 MED ORDER — DEXTROSE 5 % IV SOLN
5.0000 mg | Freq: Once | INTRAVENOUS | Status: DC
  Filled 2016-11-11: qty 0.5

## 2016-11-11 MED ORDER — ORAL CARE MOUTH RINSE
15.0000 mL | Freq: Two times a day (BID) | OROMUCOSAL | Status: DC
  Administered 2016-11-11 – 2016-11-16 (×11): 15 mL via OROMUCOSAL

## 2016-11-11 NOTE — H&P (Signed)
History and Physical    Collin [name]:[id number] DOB: Jul 09, 1941 DOA: 11/10/2016  PCP: [name], MD  Patient coming from: Home.  Chief Complaint: Nausea vomiting.  HPI: Collin [name] is a 76 y.o. male with atrial fibrillation diabetes mellitus type 2, nonischemic cardiomyopathy and hyperlipidemia presents to the ER with complaints of persistent nausea vomiting. Patient has had a neck CT yesterday and was told to have good fluid intake. Following which patient started developing multiple episodes of vomiting. Last bowel movement was 3 days ago. Denies any abdominal pain. Patient did come with abdominal pain to the ER 3 days ago and at that time CT of the abdomen did not show any obstruction. Patient was originally scheduled for surgery of the neck mass by Dr. Ezzard Standing on Friday, March 9.  ED Course: Acute abdominal series shows possible small bowel obstruction. Patient had NG tube placed and had 1700 mL of fluid suctioned. On-call general surgeon Dr. Lindie Spruce was consulted. Patient is being admitted for small bowel obstruction. Patient started developing A. fib with RVR while waiting in the ER.  Review of Systems: As per HPI, rest all negative.   Past Medical History:  Diagnosis Date  . Atrial fibrillation (HCC)   . Cardiomyopathy, nonischemic (HCC) 1998  . Diabetes mellitus   . Heart disease   . Hyperlipidemia   . OSA (obstructive sleep apnea)     Past Surgical History:  Procedure Laterality Date  . CARDIAC DEFIBRILLATOR PLACEMENT  2006  . IMPLANTABLE CARDIOVERTER DEFIBRILLATOR (ICD) GENERATOR CHANGE N/A 05/10/2014   Procedure: ICD GENERATOR CHANGE;  Surgeon: [name], MD;  Location: [facility] CATH LAB;  Service: Cardiovascular;  Laterality: N/A;     reports that he has never smoked. He has never used smokeless tobacco. He reports that he does not drink alcohol or use drugs.  Allergies  Allergen Reactions  . Niaspan [Niacin Er] Other (See Comments)    flushes     Family History  Problem Relation Age of Onset  . Heart disease Mother   . Sudden death Mother     under age 71  . Lupus Mother   . Hypertension Father   . Stroke Father     Prior to Admission medications   Medication Sig Start Date End Date Taking? Authorizing Provider  acetaminophen (TYLENOL) 500 MG tablet Take 1,000 mg by mouth every 6 (six) hours as needed for moderate pain.   Yes Historical Provider, MD  bisacodyl (BISACODYL) 5 MG EC tablet Take 5 mg by mouth daily as needed for moderate constipation.   Yes Historical Provider, MD  carvedilol (COREG) 25 MG tablet TAKE 1 TABLET TWICE DAILY WITH MEALS 06/29/16  Yes [name], MD  digoxin (LANOXIN) 0.125 MG tablet Take 1 tablet (0.125 mg total) by mouth daily. 09/17/16  Yes Pricilla Riffle, MD  furosemide (LASIX) 20 MG tablet Take 1 tablet (20 mg total) by mouth daily. Patient taking differently: Take 20 mg by mouth daily as needed for fluid.  11/13/15  Yes [name], MD  metFORMIN (GLUCOPHAGE) 500 MG tablet TAKE 1 TABLET TWICE DAILY WITH MEALS 01/26/16  Yes [name], MD  ondansetron (ZOFRAN ODT) 4 MG disintegrating tablet Take 1 tablet (4 mg total) by mouth every 8 (eight) hours as needed for nausea or vomiting. 11/09/16  Yes Gerhard Munch, MD  pravastatin (PRAVACHOL) 40 MG tablet TAKE 1 TABLET EVERY DAY 10/26/16  Yes [name], MD  ramipril (ALTACE) 5 MG capsule  TAKE 1 CAPSULE EVERY DAY 09/20/16  Yes [name], MD  warfarin (COUMADIN) 5 MG tablet Take as directed by anticoagulation clinic. Patient taking differently: Take 5-7.5 mg by mouth daily. Take 5mg  daily except take 7.5mg  on Monday. 06/08/16  Yes [name], MD    Physical Exam: Vitals:   11/11/16 0015 11/11/16 0100 11/11/16 0115 11/11/16 0215  BP: 140/97 134/95 130/95 136/79  Pulse: 115 113 (!) 55 (!) 150  Resp: 14 14 21 15   Temp:      TempSrc:      SpO2: 92% 91% 91% 95%  Weight:      Height:          Constitutional:  Moderately built and nourished. Vitals:   11/11/16 0015 11/11/16 0100 11/11/16 0115 11/11/16 0215  BP: 140/97 134/95 130/95 136/79  Pulse: 115 113 (!) 55 (!) 150  Resp: 14 14 21 15   Temp:      TempSrc:      SpO2: 92% 91% 91% 95%  Weight:      Height:       Eyes: Anicteric no pallor. ENMT: No discharge from the ears eyes nose and mouth. Neck: No mass felt. No neck rigidity. Respiratory: No rhonchi or crepitations. Cardiovascular: S1 and S2 heard no murmurs appreciated. Abdomen: Distended bowel sounds present no guarding or rigidity. Musculoskeletal: No edema. Skin: No rash. Neurologic: Alert awake oriented to time place and person. Moves all extremities. Psychiatric: Appears normal. Normal affect.   Labs on Admission: I have personally reviewed following labs and imaging studies  CBC:  Recent Labs Lab 11/09/16 0350 11/10/16 2148  WBC 5.5 3.5*  NEUTROABS 4.6 2.6  HGB 14.4 15.6  HCT 44.0 48.0  MCV 90.5 90.2  PLT 161 177   Basic Metabolic Panel:  Recent Labs Lab 11/09/16 0350 11/10/16 2148  NA 140 138  K 4.4 3.9  CL 106 101  CO2 23 27  GLUCOSE 161* 165*  BUN 18 25*  CREATININE 0.83 0.97  CALCIUM 9.4 9.0   GFR: Estimated Creatinine Clearance: 92.3 mL/min (by C-G formula based on SCr of 0.97 mg/dL). Liver Function Tests:  Recent Labs Lab 11/09/16 0350 11/10/16 2148  AST 25 20  ALT 25 23  ALKPHOS 91 84  BILITOT 0.7 1.1  PROT 7.7 8.3*  ALBUMIN 4.0 4.0    Recent Labs Lab 11/10/16 2148  LIPASE 24   No results for input(s): AMMONIA in the last 168 hours. Coagulation Profile:  Recent Labs Lab 11/09/16 0350 11/10/16 2225  INR 2.67 2.95   Cardiac Enzymes: No results for input(s): CKTOTAL, CKMB, CKMBINDEX, TROPONINI in the last 168 hours. BNP (last 3 results)  Recent Labs  09/16/16 1444  PROBNP 1,110*   HbA1C: No results for input(s): HGBA1C in the last 72 hours. CBG:  Recent Labs Lab 11/09/16 0839  GLUCAP 129*   Lipid  Profile: No results for input(s): CHOL, HDL, LDLCALC, TRIG, CHOLHDL, LDLDIRECT in the last 72 hours. Thyroid Function Tests: No results for input(s): TSH, T4TOTAL, FREET4, T3FREE, THYROIDAB in the last 72 hours. Anemia Panel: No results for input(s): VITAMINB12, FOLATE, FERRITIN, TIBC, IRON, RETICCTPCT in the last 72 hours. Urine analysis:    Component Value Date/Time   COLORURINE AMBER (A) 11/10/2016 2205   APPEARANCEUR CLEAR 11/10/2016 2205   LABSPEC >1.046 (H) 11/10/2016 2205   PHURINE 5.0 11/10/2016 2205   GLUCOSEU NEGATIVE 11/10/2016 2205   HGBUR SMALL (A) 11/10/2016 2205   BILIRUBINUR NEGATIVE 11/10/2016 2205   KETONESUR 20 (  A) 11/10/2016 2205   PROTEINUR 100 (A) 11/10/2016 2205   UROBILINOGEN 0.2 08/20/2011 1711   NITRITE NEGATIVE 11/10/2016 2205   LEUKOCYTESUR NEGATIVE 11/10/2016 2205   Sepsis Labs: @LABRCNTIP (procalcitonin:4,lacticidven:4) )No results found for this or any previous visit (from the past 240 hour(s)).   Radiological Exams on Admission: Ct Soft Tissue Neck W Contrast  Result Date: 11/10/2016 CLINICAL DATA:  76 y/o  M; right neck mass. EXAM: CT NECK WITH CONTRAST TECHNIQUE: Multidetector CT imaging of the neck was performed using the standard protocol following the bolus administration of intravenous contrast. CONTRAST:  75mL ISOVUE-300 IOPAMIDOL (ISOVUE-300) INJECTION 61% COMPARISON:  None. FINDINGS: Pharynx and larynx: Right base of tongue mass measuring 36 x 32 x 37 mm (AP x ML x CC series 3, image 37 and series 200, image 56). The mass effaces the right vallecula glossopharyngeal sulcus and inferiorly abuts the hyoid bone. The mass invades the tongue 27 mm and extends approximately 9 mm into the contralateral base of tongue. Salivary glands: No inflammation, mass, or stone. Thyroid: Subcentimeter low hypodense foci probably represent nodules. Lymph nodes: Necrotic lymphadenopathy in the right level 2, 3, 4, and 5a/b stations. Left-sided cervical necrotic  lymphadenopathy at the level 2 station. The largest lymph node in the right level 2 station measures 41 x 39 x 49 mm (AP x ML x CC). There is stranding surrounding the right upper cervical lymph nodes with irregular margins for example (series 200, image 39) and an indistinct border between the right submandibular gland and adjacent adenopathy (series 3, image 47) indicating probable extra nodal extension. Vascular: Patent carotid and vertebral arteries of the neck. Patent internal jugular veins. Moderate calcific atherosclerosis of carotid bifurcations without high-grade stenosis. Brief submucosal retropharyngeal course of proximal ICA bilaterally. Limited intracranial: Negative. Visualized orbits: Not within the field of view. Mastoids and visualized paranasal sinuses: Right greater than left maxillary sinus mucosal thickening. Skeleton: No acute fracture or suspicious osseous lesion. Moderate cervical spondylosis with predominantly discogenic degenerative changes. No high-grade bony canal stenosis. Upper chest: Aortic atherosclerosis with moderate calcification. Other: None. IMPRESSION: 1. 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 The mass measures up to 37 mm. Visible mass invades base of tongue 27 mm and extends 9 mm to contralateral base of tongue. 2. Right level 2, 3, 4, 5a/b necrotic lymphadenopathy with findings of extra nodal extension. Left level 2 lymphadenopathy. These results will be called to the ordering clinician or representative by the Radiologist Assistant, and communication documented in the PACS or zVision Dashboard. Electronically Signed   By: Mitzi Hansen M.D.   On: 11/10/2016 15:22   Ct Abdomen Pelvis W Contrast  Result Date: 11/09/2016 CLINICAL DATA:  Abdominal pain.  Recurrent umbilical hernia. EXAM: CT ABDOMEN AND PELVIS WITH CONTRAST TECHNIQUE: Multidetector CT imaging of the abdomen and pelvis was performed using the standard protocol following bolus  administration of intravenous contrast. 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 Hepatobiliary: The liver parenchyma is normal. Single 17 mm calcified gallstone. Gallbladder wall is not thickened. No dilated bile ducts. 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 Pancreas: Unremarkable. No pancreatic ductal dilatation or surrounding inflammatory changes. Spleen: Normal in size without focal abnormality other than a single calcified  granuloma. Adrenals/Urinary Tract: Adrenal glands are unremarkable. Kidneys are normal, without renal calculi, focal lesion, or hydronephrosis. Bladder is unremarkable. Stomach/Bowel: Stomach is within normal limits. Appendix appears normal. No evidence of bowel wall thickening, distention, or inflammatory changes. Vascular/Lymphatic: Extensive aortic atherosclerosis. No adenopathy. 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 Reproductive: Prostate is unremarkable. Other: There is a 7.4 cm umbilical hernia containing only fat. No bowel within the hernia. Musculoskeletal: No acute or significant osseous findings. IMPRESSION: 1. Umbilical hernia containing only peritoneal fat. No bowel obstruction. 2. Chronic cholelithiasis. A gallstone is present on the prior study of 06/10/2014. 3. 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 Electronically Signed   By: Francene Boyers M.D.   On: 11/09/2016 08:10   Dg Abd Acute W/chest  Result Date: 11/10/2016 CLINICAL DATA:  Initial evaluation for acute nausea, vomiting, abdominal pain status post CT earlier today. EXAM: DG ABDOMEN ACUTE W/ 1V CHEST COMPARISON:  Prior CT from 11/09/2016. FINDINGS: Left-sided single  lead transvenous pacemaker/ AICD in place. Moderate cardiomegaly. Mediastinal silhouette within normal limits. Aortic atherosclerosis. No focal infiltrates. Mild elevation of the left hemidiaphragm. Mild subsegmental atelectasis within the bilateral lung bases. No focal infiltrates. Mild central perihilar vascular congestion without overt pulmonary edema. No pleural effusion. No pneumothorax. Scattered gas-filled loops of small bowel present throughout the abdomen, measuring up to 6.3 cm in diameter. These may be more prominent as compared to prior CT, although the direct comparison somewhat difficult. Few scattered air-fluid levels on upright projection. Finding may reflect sequela of a new and/or developing obstructive process. Secreted contrast material within the bladder. Previously noted gallstone noted within the right upper quadrant. No other soft tissue mass or abnormal calcification. No free air. Level degenerative changes noted within the visualized spine. IMPRESSION: 1. Scattered prominent loops of gas-filled small bowel within the abdomen, somewhat more prominent as compared to recent CT from 11/09/2016. Findings are suspicious for possible new and/or developing obstruction. Further evaluation with repeat cross-sectional imaging could be performed as indicated. 2. Mild bibasilar subsegmental atelectasis. No other active cardiopulmonary disease. 3. Cardiomegaly. 4. Aortic atherosclerosis. Electronically Signed   By: Rise Mu M.D.   On: 11/10/2016 23:36   Dg Abd Portable 1 View  Result Date: 11/11/2016 CLINICAL DATA:  Initial evaluation for NG tube placement. EXAM: PORTABLE ABDOMEN - 1 VIEW COMPARISON:  Prior radiograph from 11/10/2016. FINDINGS: NG tube in place, with tip at or just above the GE junction, side hole in the distal esophagus. Advancement by approximately 7-8 cm is recommended. Again, several mildly prominent gas-filled loops of bowel noted within the upper abdomen, better  evaluated on prior study. Mild bibasilar subsegmental atelectasis. IMPRESSION: Tip of enteric tube at the level the GE junction, side hole in the distal esophagus. Advancement by 7-8 cm suggested to insure adequate placement side hole within the stomach. Electronically Signed   By: Rise Mu M.D.   On: 11/11/2016 01:08    EKG: Independently reviewed. A. fib with RVR.  Assessment/Plan Principal Problem:   SBO (small bowel obstruction) Active Problems:   Atrial fibrillation with RVR (HCC)   Nonischemic cardiomyopathy (HCC)   Type 2 diabetes mellitus (HCC)    1. Small bowel obstruction - patient had NG tube placed. Will keep patient nothing by mouth. General surgery has been consulted. Serial KUBs and further recommendations per general surgery. 2. A. fib with RVR - since patient may require surgical intervention and I have placed patient on vitamin K 5 mg IV  to reverse Coumadin. Patient on Cardizem infusion for A. fib with RVR. 3. Diabetes mellitus type 2 - place patient on sliding scale coverage. 4. Nonischemic cardiomyopathy - patient is receiving IV fluids at this time.  5. Neck mass to be seen by Dr. Ezzard Standing and is planning to have surgery.   DVT prophylaxis: SCDs. Code Status: Full code.  Family Communication: Patient's wife.  Disposition Plan: Home.  Consults called: General surgery.  Admission status: Inpatient.    Eduard Clos MD [facility] Pager [phone].  If 7PM-7AM, please contact night-coverage [email or web address] Password [email or web address]  11/11/2016, 2:51 AM

## 2016-11-11 NOTE — ED Notes (Signed)
Admitting at bedside 

## 2016-11-11 NOTE — Progress Notes (Signed)
Pt stated he felt that the NG tube was "choking" him.  Xray ordered which confirmed the tube looped in the pts neck.  I pulled back the NG tube and reinserted it back to original cm marking.  Pt resting comfortably and states the tube feels better.  Will continue to monitor.

## 2016-11-11 NOTE — ED Notes (Signed)
Cardizem not started at present, palpable pulse is 76. Next nurse is aware and will continue to monitor pulse. Palpable pulse and monitor do not match up

## 2016-11-11 NOTE — Consult Note (Signed)
Reason for Consult:Small bowel obstruction Referring Physician: [name] is an 76 y.o. male.  HPI: Seen in ED 3 days ago with incarcerated umbilical hernia which was reduced with SBO, got better initially , then got worse again without recurrently of the incarcerated hernia.  Past Medical History:  Diagnosis Date  . Atrial fibrillation (HCC)   . Cardiomyopathy, nonischemic (HCC) 1998  . Diabetes mellitus   . Heart disease   . Hyperlipidemia   . OSA (obstructive sleep apnea)     Past Surgical History:  Procedure Laterality Date  . CARDIAC DEFIBRILLATOR PLACEMENT  2006  . IMPLANTABLE CARDIOVERTER DEFIBRILLATOR (ICD) GENERATOR CHANGE N/A 05/10/2014   Procedure: ICD GENERATOR CHANGE;  Surgeon: [name], MD;  Location: [facility] CATH LAB;  Service: Cardiovascular;  Laterality: N/A;    Family History  Problem Relation Age of Onset  . Heart disease Mother   . Sudden death Mother     under age 74  . Lupus Mother   . Hypertension Father   . Stroke Father     Social History:  reports that he has never smoked. He has never used smokeless tobacco. He reports that he does not drink alcohol or use drugs.  Allergies:  Allergies  Allergen Reactions  . Niaspan [Niacin Er] Other (See Comments)    flushes    Medications: I have reviewed the patient's current medications.  Results for orders placed or performed during the hospital encounter of 11/10/16 (from the past 48 hour(s))  CBC with Differential     Status: Abnormal   Collection Time: 11/10/16  9:48 PM  Result Value Ref Range   WBC 3.5 (L) 4.0 - 10.5 K/uL   RBC 5.32 4.22 - 5.81 MIL/uL   Hemoglobin 15.6 13.0 - 17.0 g/dL   HCT 27.7 82.4 - 23.5 %   MCV 90.2 78.0 - 100.0 fL   MCH 29.3 26.0 - 34.0 pg   MCHC 32.5 30.0 - 36.0 g/dL   RDW 36.1 44.3 - 15.4 %   Platelets 177 150 - 400 K/uL   Neutrophils Relative % 74 %   Neutro Abs 2.6 1.7 - 7.7 K/uL   Lymphocytes Relative 14 %   Lymphs Abs 0.5 (L) 0.7 - 4.0 K/uL    Monocytes Relative 11 %   Monocytes Absolute 0.4 0.1 - 1.0 K/uL   Eosinophils Relative 1 %   Eosinophils Absolute 0.0 0.0 - 0.7 K/uL   Basophils Relative 0 %   Basophils Absolute 0.0 0.0 - 0.1 K/uL  Comprehensive metabolic panel     Status: Abnormal   Collection Time: 11/10/16  9:48 PM  Result Value Ref Range   Sodium 138 135 - 145 mmol/L   Potassium 3.9 3.5 - 5.1 mmol/L   Chloride 101 101 - 111 mmol/L   CO2 27 22 - 32 mmol/L   Glucose, Bld 165 (H) 65 - 99 mg/dL   BUN 25 (H) 6 - 20 mg/dL   Creatinine, Ser 0.08 0.61 - 1.24 mg/dL   Calcium 9.0 8.9 - 67.6 mg/dL   Total Protein 8.3 (H) 6.5 - 8.1 g/dL   Albumin 4.0 3.5 - 5.0 g/dL   AST 20 15 - 41 U/L   ALT 23 17 - 63 U/L   Alkaline Phosphatase 84 38 - 126 U/L   Total Bilirubin 1.1 0.3 - 1.2 mg/dL   GFR calc non Af Amer >60 >60 mL/min   GFR calc Af Amer >60 >60 mL/min    Comment: (NOTE)  The eGFR has been calculated using the CKD EPI equation. This calculation has not been validated in all clinical situations. eGFR's persistently <60 mL/min signify possible Chronic Kidney Disease.    Anion gap 10 5 - 15  Lipase, blood     Status: None   Collection Time: 11/10/16  9:48 PM  Result Value Ref Range   Lipase 24 11 - 51 U/L  Urinalysis, Routine w reflex microscopic     Status: Abnormal   Collection Time: 11/10/16 10:05 PM  Result Value Ref Range   Color, Urine AMBER (A) YELLOW    Comment: BIOCHEMICALS MAY BE AFFECTED BY COLOR   APPearance CLEAR CLEAR   Specific Gravity, Urine >1.046 (H) 1.005 - 1.030   pH 5.0 5.0 - 8.0   Glucose, UA NEGATIVE NEGATIVE mg/dL   Hgb urine dipstick SMALL (A) NEGATIVE   Bilirubin Urine NEGATIVE NEGATIVE   Ketones, ur 20 (A) NEGATIVE mg/dL   Protein, ur 100 (A) NEGATIVE mg/dL   Nitrite NEGATIVE NEGATIVE   Leukocytes, UA NEGATIVE NEGATIVE   RBC / HPF 0-5 0 - 5 RBC/hpf   WBC, UA 0-5 0 - 5 WBC/hpf   Bacteria, UA NONE SEEN NONE SEEN   Squamous Epithelial / LPF 0-5 (A) NONE SEEN   Mucous PRESENT    Occult bld gastric/duodenum (cup to lab)     Status: Abnormal   Collection Time: 11/10/16 10:25 PM  Result Value Ref Range   pH, Gastric NOT DONE    Occult Blood, Gastric POSITIVE (A) NEGATIVE  Protime-INR     Status: Abnormal   Collection Time: 11/10/16 10:25 PM  Result Value Ref Range   Prothrombin Time 31.4 (H) 11.4 - 15.2 seconds   INR 2.95   I-Stat Troponin, ED (not at [facility])     Status: None   Collection Time: 11/10/16 11:33 PM  Result Value Ref Range   Troponin i, poc 0.01 0.00 - 0.08 ng/mL   Comment 3            Comment: Due to the release kinetics of cTnI, a negative result within the first hours of the onset of symptoms does not rule out myocardial infarction with certainty. If myocardial infarction is still suspected, repeat the test at appropriate intervals.   I-Stat CG4 Lactic Acid, ED     Status: None   Collection Time: 11/10/16 11:35 PM  Result Value Ref Range   Lactic Acid, Venous 1.27 0.5 - 1.9 mmol/L  Digoxin level     Status: Abnormal   Collection Time: 11/10/16 11:50 PM  Result Value Ref Range   Digoxin Level <0.2 (L) 0.8 - 2.0 ng/mL    Comment: RESULTS CONFIRMED BY MANUAL DILUTION  I-Stat CG4 Lactic Acid, ED     Status: None   Collection Time: 11/11/16  2:52 AM  Result Value Ref Range   Lactic Acid, Venous 1.12 0.5 - 1.9 mmol/L  CBG monitoring, ED     Status: Abnormal   Collection Time: 11/11/16  3:04 AM  Result Value Ref Range   Glucose-Capillary 129 (H) 65 - 99 mg/dL  Comprehensive metabolic panel     Status: Abnormal   Collection Time: 11/11/16  3:56 AM  Result Value Ref Range   Sodium 140 135 - 145 mmol/L   Potassium 3.7 3.5 - 5.1 mmol/L   Chloride 105 101 - 111 mmol/L   CO2 25 22 - 32 mmol/L   Glucose, Bld 134 (H) 65 - 99 mg/dL   BUN 22 (H) 6 -  20 mg/dL   Creatinine, Ser 0.88 0.61 - 1.24 mg/dL   Calcium 8.0 (L) 8.9 - 10.3 mg/dL   Total Protein 7.0 6.5 - 8.1 g/dL   Albumin 3.6 3.5 - 5.0 g/dL   AST 18 15 - 41 U/L   ALT 20 17 - 63 U/L    Alkaline Phosphatase 72 38 - 126 U/L   Total Bilirubin 0.9 0.3 - 1.2 mg/dL   GFR calc non Af Amer >60 >60 mL/min   GFR calc Af Amer >60 >60 mL/min    Comment: (NOTE) The eGFR has been calculated using the CKD EPI equation. This calculation has not been validated in all clinical situations. eGFR's persistently <60 mL/min signify possible Chronic Kidney Disease.    Anion gap 10 5 - 15  CBC with Differential/Platelet     Status: Abnormal   Collection Time: 11/11/16  3:56 AM  Result Value Ref Range   WBC 2.5 (L) 4.0 - 10.5 K/uL   RBC 4.84 4.22 - 5.81 MIL/uL   Hemoglobin 14.2 13.0 - 17.0 g/dL   HCT 43.9 39.0 - 52.0 %   MCV 90.7 78.0 - 100.0 fL   MCH 29.3 26.0 - 34.0 pg   MCHC 32.3 30.0 - 36.0 g/dL   RDW 14.8 11.5 - 15.5 %   Platelets 164 150 - 400 K/uL   Neutrophils Relative % 61 %   Neutro Abs 1.5 (L) 1.7 - 7.7 K/uL   Lymphocytes Relative 20 %   Lymphs Abs 0.5 (L) 0.7 - 4.0 K/uL   Monocytes Relative 18 %   Monocytes Absolute 0.4 0.1 - 1.0 K/uL   Eosinophils Relative 1 %   Eosinophils Absolute 0.0 0.0 - 0.7 K/uL   Basophils Relative 0 %   Basophils Absolute 0.0 0.0 - 0.1 K/uL  TSH     Status: None   Collection Time: 11/11/16  3:56 AM  Result Value Ref Range   TSH 0.415 0.350 - 4.500 uIU/mL    Comment: Performed by a 3rd Generation assay with a functional sensitivity of <=0.01 uIU/mL.  CBG monitoring, ED     Status: Abnormal   Collection Time: 11/11/16  3:58 AM  Result Value Ref Range   Glucose-Capillary 135 (H) 65 - 99 mg/dL    Ct Soft Tissue Neck W Contrast  Result Date: 11/10/2016 CLINICAL DATA:  77 y/o  M; right neck mass. EXAM: CT NECK WITH CONTRAST TECHNIQUE: Multidetector CT imaging of the neck was performed using the standard protocol following the bolus administration of intravenous contrast. CONTRAST:  9m ISOVUE-300 IOPAMIDOL (ISOVUE-300) INJECTION 61% COMPARISON:  None. FINDINGS: Pharynx and larynx: Right base of tongue mass measuring 36 x 32 x 37 mm (AP x ML x CC  series 3, image 37 and series 200, image 56). The mass effaces the right vallecula glossopharyngeal sulcus and inferiorly abuts the hyoid bone. The mass invades the tongue 27 mm and extends approximately 9 mm into the contralateral base of tongue. Salivary glands: No inflammation, mass, or stone. Thyroid: Subcentimeter low hypodense foci probably represent nodules. Lymph nodes: Necrotic lymphadenopathy in the right level 2, 3, 4, and 5a/b stations. Left-sided cervical necrotic lymphadenopathy at the level 2 station. The largest lymph node in the right level 2 station measures 41 x 39 x 49 mm (AP x ML x CC). There is stranding surrounding the right upper cervical lymph nodes with irregular margins for example (series 200, image 39) and an indistinct border between the right submandibular gland and adjacent adenopathy (series  3, image 47) indicating probable extra nodal extension. Vascular: Patent carotid and vertebral arteries of the neck. Patent internal jugular veins. Moderate calcific atherosclerosis of carotid bifurcations without high-grade stenosis. Brief submucosal retropharyngeal course of proximal ICA bilaterally. Limited intracranial: Negative. Visualized orbits: Not within the field of view. Mastoids and visualized paranasal sinuses: Right greater than left maxillary sinus mucosal thickening. Skeleton: No acute fracture or suspicious osseous lesion. Moderate cervical spondylosis with predominantly discogenic degenerative changes. No high-grade bony canal stenosis. Upper chest: Aortic atherosclerosis with moderate calcification. Other: None. IMPRESSION: 1. 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 The mass measures up to 37 mm. Visible mass invades base of tongue 27 mm and extends 9 mm to contralateral base of tongue. 2. Right level 2, 3, 4, 5a/b necrotic lymphadenopathy with findings of extra nodal extension. Left level 2 lymphadenopathy. These results will be called to the ordering  clinician or representative by the Radiologist Assistant, and communication documented in the PACS or zVision Dashboard. Electronically Signed   By: Mitzi Hansen M.D.   On: 11/10/2016 15:22   Ct Abdomen Pelvis W Contrast  Result Date: 11/09/2016 CLINICAL DATA:  Abdominal pain.  Recurrent umbilical hernia. EXAM: CT ABDOMEN AND PELVIS WITH CONTRAST TECHNIQUE: Multidetector CT imaging of the abdomen and pelvis was performed using the standard protocol following bolus administration of intravenous contrast. 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 Hepatobiliary: The liver parenchyma is normal. Single 17 mm calcified gallstone. Gallbladder wall is not thickened. No dilated bile ducts. 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 Pancreas: Unremarkable. No pancreatic ductal dilatation or surrounding inflammatory changes. Spleen: Normal in size without focal abnormality other than a single calcified granuloma. Adrenals/Urinary Tract: Adrenal glands are unremarkable. Kidneys are normal, without renal calculi, focal lesion, or hydronephrosis. Bladder is unremarkable. Stomach/Bowel: Stomach is within normal limits. Appendix appears normal. No evidence of bowel wall thickening, distention, or inflammatory changes. Vascular/Lymphatic: Extensive aortic atherosclerosis. No adenopathy. 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 Reproductive: Prostate is unremarkable. Other: There is a 7.4 cm umbilical hernia containing only fat. No bowel within the hernia. Musculoskeletal: No acute or significant osseous findings. IMPRESSION: 1. Umbilical hernia containing only peritoneal fat. No bowel obstruction. 2.  Chronic cholelithiasis. A gallstone is present on the prior study of 06/10/2014. 3. 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 Electronically Signed   By: Francene Boyers M.D.   On: 11/09/2016 08:10   Dg Abd Acute W/chest  Result Date: 11/10/2016 CLINICAL DATA:  Initial evaluation for acute nausea, vomiting, abdominal pain status post CT earlier today. EXAM: DG ABDOMEN ACUTE W/ 1V CHEST COMPARISON:  Prior CT from 11/09/2016. FINDINGS: Left-sided single lead transvenous pacemaker/ AICD in place. Moderate cardiomegaly. Mediastinal silhouette within normal limits. Aortic atherosclerosis. No focal infiltrates. Mild elevation of the left hemidiaphragm. Mild subsegmental atelectasis within the bilateral lung bases. No focal infiltrates. Mild central perihilar vascular congestion without overt pulmonary edema. No pleural effusion. No pneumothorax. Scattered gas-filled loops of small bowel present throughout the abdomen, measuring up to 6.3 cm in diameter. These may be more prominent as compared to prior CT, although the direct comparison somewhat difficult. Few scattered air-fluid levels on upright projection. Finding may reflect  sequela of a new and/or developing obstructive process. Secreted contrast material within the bladder. Previously noted gallstone noted within the right upper quadrant. No other soft tissue mass or abnormal calcification. No free air. Level degenerative changes noted within the visualized spine. IMPRESSION: 1. Scattered prominent loops of gas-filled small bowel within the abdomen, somewhat more prominent as compared to recent CT from 11/09/2016. Findings are suspicious for possible new and/or developing obstruction. Further evaluation with repeat cross-sectional imaging could be performed as indicated. 2. Mild bibasilar subsegmental atelectasis. No other active cardiopulmonary disease. 3. Cardiomegaly. 4. Aortic atherosclerosis. Electronically Signed   By: Jeannine Boga M.D.   On: 11/10/2016 23:36   Dg Abd Portable 1 View  Result Date: 11/11/2016 CLINICAL DATA:  Initial evaluation for NG tube placement. EXAM: PORTABLE ABDOMEN - 1 VIEW COMPARISON:  Prior radiograph from 11/10/2016. FINDINGS: NG tube in place, with tip at or just above the GE junction, side hole in the distal esophagus. Advancement by approximately 7-8 cm is recommended. Again, several mildly prominent gas-filled loops of bowel noted within the upper abdomen, better evaluated on prior study. Mild bibasilar subsegmental atelectasis. IMPRESSION: Tip of enteric tube at the level the GE junction, side hole in the distal esophagus. Advancement by 7-8 cm suggested to insure adequate placement side hole within the stomach. Electronically Signed   By: Jeannine Boga M.D.   On: 11/11/2016 01:08    Review of Systems  Constitutional: Positive for fever. Negative for chills.  Gastrointestinal: Positive for abdominal pain, nausea and vomiting.  All other systems reviewed and are negative.  Blood pressure 119/84, pulse 106, temperature 97.8 F (36.6 C), temperature source Oral, resp. rate 21, height 6' (1.829 m), weight 131.5 kg (290 lb), SpO2 95 %. Physical Exam  Vitals reviewed. Constitutional: He is oriented to person, place, and time.  Morbidly obese  HENT:  Head: Normocephalic and atraumatic.  Has NGT in place  Eyes: Conjunctivae and EOM are normal. Pupils are equal, round, and reactive to light.  Neck: Normal range of motion. Neck supple.  Cardiovascular: Intact distal pulses.  An irregularly irregular rhythm present. Tachycardia present.  Exam reveals no friction rub.   Respiratory: Effort normal and breath sounds normal.  GI: Soft. He exhibits distension. Bowel sounds are decreased. There is no tenderness.  2.6 liters of NGT output since placed yesterday evening  Musculoskeletal: Normal range of motion.  Neurological: He is alert and oriented to person, place, and time. He has  normal reflexes.  Skin: Skin is warm and dry.  Psychiatric: He has a normal mood and affect. His behavior is normal. Judgment and thought content normal.    Assessment/Plan: SBO, patient with MMP including significant cardiomyopathy, AICD in place, Previous incarcerated umbilical hernia reduced a few days ago, now with SBO.  Started yesterday early with black emesis, marked distension, and abdominal pain.  Pain is significantly improved with NGT placement and drainage.  Patient is on coumadin with an INR of 2.95.  Of course this would need to be corrected if operative intervention is being considered.  Right now I would just hold the coumadin and not actively reverse it.    We will consider the SBO protocol  Andras Grunewald 11/11/2016, 6:09 AM

## 2016-11-11 NOTE — Progress Notes (Signed)
I have seen and assessed patient and agree with Dr.Kakrakandy's assessment and plan. Patient 75 year general history of A. fib, diabetes, nonischemic cardiomyopathy, hyperlipidemia presented with nausea vomiting. Patient noted to have a small bowel obstruction. Patient was seen in consultation by general surgery. NG tube has been placed. Patient's anticoagulation held. Follow INR. Patient also noted to have a neck mass and had been seen by Dr. Lucia Gaskins, ENT he was planning on a biopsy of the base of patient's tongue as well as removal of right shoulder lipoma. Dr. Lucia Gaskins of the ENT stating that if patient has to be taken for surgery for small bowel obstruction he will like to be notified so he could perform biopsy at the same time. IV fluids. Supportive care.  No charge.

## 2016-11-11 NOTE — ED Notes (Signed)
NG placed prior to x-ray

## 2016-11-11 NOTE — ED Notes (Signed)
Hulen Skains MD at beside

## 2016-11-12 ENCOUNTER — Ambulatory Visit (HOSPITAL_BASED_OUTPATIENT_CLINIC_OR_DEPARTMENT_OTHER): Admission: RE | Admit: 2016-11-12 | Payer: Medicare HMO | Source: Ambulatory Visit | Admitting: Otolaryngology

## 2016-11-12 ENCOUNTER — Encounter (HOSPITAL_BASED_OUTPATIENT_CLINIC_OR_DEPARTMENT_OTHER): Admission: RE | Payer: Self-pay | Source: Ambulatory Visit

## 2016-11-12 DIAGNOSIS — K148 Other diseases of tongue: Secondary | ICD-10-CM | POA: Diagnosis present

## 2016-11-12 DIAGNOSIS — R220 Localized swelling, mass and lump, head: Secondary | ICD-10-CM

## 2016-11-12 LAB — BASIC METABOLIC PANEL
Anion gap: 7 (ref 5–15)
BUN: 14 mg/dL (ref 6–20)
CO2: 28 mmol/L (ref 22–32)
Calcium: 8.3 mg/dL — ABNORMAL LOW (ref 8.9–10.3)
Chloride: 106 mmol/L (ref 101–111)
Creatinine, Ser: 0.82 mg/dL (ref 0.61–1.24)
GFR calc Af Amer: >60 mL/min (ref >60)
GFR calc non Af Amer: >60 mL/min (ref >60)
Glucose, Bld: 136 mg/dL — ABNORMAL HIGH (ref 65–99)
Potassium: 3.6 mmol/L (ref 3.5–5.1)
Sodium: 141 mmol/L (ref 135–145)

## 2016-11-12 LAB — MAGNESIUM: Magnesium: 1.8 mg/dL (ref 1.7–2.4)

## 2016-11-12 LAB — PROTIME-INR
INR: 2.69
Prothrombin Time: 29.2 seconds — ABNORMAL HIGH (ref 11.4–15.2)

## 2016-11-12 LAB — GLUCOSE, CAPILLARY
Glucose-Capillary: 135 mg/dL — ABNORMAL HIGH (ref 65–99)
Glucose-Capillary: 128 mg/dL — ABNORMAL HIGH (ref 65–99)
Glucose-Capillary: 159 mg/dL — ABNORMAL HIGH (ref 65–99)
Glucose-Capillary: 154 mg/dL — ABNORMAL HIGH (ref 65–99)
Glucose-Capillary: 130 mg/dL — ABNORMAL HIGH (ref 65–99)

## 2016-11-12 LAB — CBC
HCT: 42 % (ref 39.0–52.0)
Hemoglobin: 13.2 g/dL (ref 13.0–17.0)
MCH: 28.9 pg (ref 26.0–34.0)
MCHC: 31.4 g/dL (ref 30.0–36.0)
MCV: 92.1 fL (ref 78.0–100.0)
Platelets: 153 10*3/uL (ref 150–400)
RBC: 4.56 MIL/uL (ref 4.22–5.81)
RDW: 14.9 % (ref 11.5–15.5)
WBC: 3.9 10*3/uL — ABNORMAL LOW (ref 4.0–10.5)

## 2016-11-12 SURGERY — LARYNGOSCOPY, DIRECT
Anesthesia: General | Laterality: Right

## 2016-11-12 MED ORDER — MAGNESIUM SULFATE 4 GM/100ML IV SOLN
4.0000 g | Freq: Once | INTRAVENOUS | Status: AC
  Administered 2016-11-12: 4 g via INTRAVENOUS
  Filled 2016-11-12: qty 100

## 2016-11-12 MED ORDER — SODIUM CHLORIDE 0.9 % IV SOLN
30.0000 meq | Freq: Once | INTRAVENOUS | Status: AC
  Administered 2016-11-12: 30 meq via INTRAVENOUS
  Filled 2016-11-12: qty 15

## 2016-11-12 MED ORDER — INSULIN ASPART 100 UNIT/ML ~~LOC~~ SOLN
0.0000 [IU] | Freq: Three times a day (TID) | SUBCUTANEOUS | Status: DC
  Administered 2016-11-12 (×2): 3 [IU] via SUBCUTANEOUS
  Administered 2016-11-13 – 2016-11-14 (×5): 2 [IU] via SUBCUTANEOUS
  Administered 2016-11-15: 3 [IU] via SUBCUTANEOUS
  Administered 2016-11-15: 2 [IU] via SUBCUTANEOUS
  Administered 2016-11-16: 5 [IU] via SUBCUTANEOUS
  Administered 2016-11-16 – 2016-11-17 (×2): 2 [IU] via SUBCUTANEOUS

## 2016-11-12 NOTE — Progress Notes (Signed)
PROGRESS NOTE    Collin [name]  ONG:[id number] DOB: 1941-05-14 DOA: 11/10/2016 PCP: [name], MD    Brief Narrative:   Collin [name] is a 76 y.o. male with atrial fibrillation diabetes mellitus type 2, nonischemic cardiomyopathy and hyperlipidemia presents to the ER with complaints of persistent nausea vomiting. Patient has had a neck CT yesterday and was told to have good fluid intake. Following which patient started developing multiple episodes of vomiting. Last bowel movement was 3 days ago. Denies any abdominal pain. Patient did come with abdominal pain to the ER 3 days ago and at that time CT of the abdomen did not show any obstruction. Patient was originally scheduled for surgery of the neck mass by Dr. Ezzard Standing on Friday, March 9.  ED Course: Acute abdominal series shows possible small bowel obstruction. Patient had NG tube placed and had 1700 mL of fluid suctioned. On-call general surgeon Dr. Lindie Spruce was consulted. Patient is being admitted for small bowel obstruction. Patient started developing A. fib with RVR while waiting in the ER.   Assessment & Plan:   Principal Problem:   SBO (small bowel obstruction) Active Problems:   Atrial fibrillation with RVR (HCC)   Nonischemic cardiomyopathy (HCC)   Type 2 diabetes mellitus (HCC)   Tongue mass: Per Ct neck 11/10/2016  #1 small bowel obstruction Resolving per abdominal films. Patient noted to have contrast throughout the colon. Moderate gaseous distention of the small bowel per abdominal films. Patient passing flatus. No bowel movement. Abdomen is soft and nontender to palpation. No further nausea or emesis. NG tube has been clamped for general surgery and patient started on clears. Replete electrolytes. Per general surgery.  #2 A. fib with RVR Patient currently on Cardizem drip. Patient in A. fib with RVR likely secondary to problem #1. Chest x-ray negative for any acute infiltrates. INR is therapeutic. Coumadin on hold in  anticipation of tongue biopsy in the outpatient setting. Continue to hold anticoagulation. Once small bowel obstruction has resolved and patient tolerating oral intake will transition to his home regimen of Coreg.  #3 diabetes mellitus type 2 Hemoglobin A1c was 6.9 on 11/13/2015. Repeat hemoglobin A1c. Hold oral hypoglycemic agents. Patient has been placed on clear liquids and as such will place on sliding scale insulin. Follow.  #4 hyperlipidemia Statin on hold. Resume once patient tolerating oral intake and small bowel obstruction has resolved.  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 Patient was supposed to have a biopsy done and elective removal of shoulder lipoma per Dr.Newman, ENT today however this will need to be postponed secondary to acute illness and hospitalization. We'll continue to hold patient's anticoagulation. Case was discussed with Dr. Ezzard Standing on 11/11/2016.    DVT prophylaxis: INR therapeutic. Code Status: Full Family Communication: (Specify name, relationship & date discussed. NO "discussed with patient") Disposition Plan: Remain the step down unit while on Cardizem drip for A. fib with RVR. Hopefully home when small bowel obstruction has resolved and tolerating oral intake and A. fib with RVR controlled.   Consultants:   General surgery: Dr. Lindie Spruce 11/11/2016  Procedures:   CT soft tissue neck 11/10/2016  Acute abdominal series 11/10/2016  Chest x-ray 11/11/2016  Abdominal films 11/11/2016  Antimicrobials:   None   Subjective: Patient states he feels well. Denies any shortness of breath. No chest pain. No abdominal pain. No nausea. No vomiting. NG tube has been clamped. Patient  with clear liquids in front of him. Patient states he's passing flatus. No bowel movement.  Objective: Vitals:   11/11/16 2253 11/12/16 0327 11/12/16 0758 11/12/16 0800  BP: (!)  142/91 (!) 142/82 (!) 137/91   Pulse: (!) 101 (!) 101 86 100  Resp: 18 (!) 27 19 19   Temp: 99.6 F (37.6 C) 98.7 F (37.1 C) 98.4 F (36.9 C)   TempSrc: Oral Oral Oral   SpO2: 95% 92% 95% 94%  Weight:      Height:        Intake/Output Summary (Last 24 hours) at 11/12/16 0927 Last data filed at 11/12/16 0800  Gross per 24 hour  Intake             2463 ml  Output             1275 ml  Net             1188 ml   Filed Weights   11/10/16 2142 11/11/16 1300  Weight: 131.5 kg (290 lb) 128.2 kg (282 lb 10.1 oz)    Examination:  General exam: Appears calm and comfortable  Respiratory system: Clear to auscultation. Respiratory effort normal. Cardiovascular system: Irregularly irregular. No JVD, murmurs, rubs, gallops or clicks. Trace-1+ BLE edema. Gastrointestinal system: Abdomen is obese, mildly distended, softer and nontender. No organomegaly or masses felt. Normal bowel sounds heard. Central nervous system: Alert and oriented. No focal neurological deficits. Extremities: Symmetric 5 x 5 power. Skin: No rashes, lesions or ulcers Psychiatry: Judgement and insight appear normal. Mood & affect appropriate.     Data Reviewed: I have personally reviewed following labs and imaging studies  CBC:  Recent Labs Lab 11/09/16 0350 11/10/16 2148 11/11/16 0356 11/12/16 0326  WBC 5.5 3.5* 2.5* 3.9*  NEUTROABS 4.6 2.6 1.5*  --   HGB 14.4 15.6 14.2 13.2  HCT 44.0 48.0 43.9 42.0  MCV 90.5 90.2 90.7 92.1  PLT 161 177 164 153   Basic Metabolic Panel:  Recent Labs Lab 11/09/16 0350 11/10/16 2148 11/11/16 0356 11/12/16 0326  NA 140 138 140 141  K 4.4 3.9 3.7 3.6  CL 106 101 105 106  CO2 23 27 25 28   GLUCOSE 161* 165* 134* 136*  BUN 18 25* 22* 14  CREATININE 0.83 0.97 0.88 0.82  CALCIUM 9.4 9.0 8.0* 8.3*  MG  --   --   --  1.8   GFR: Estimated Creatinine Clearance: 109.2 mL/min (by C-G formula based on SCr of 0.82 mg/dL). Liver Function Tests:  Recent Labs Lab  11/09/16 0350 11/10/16 2148 11/11/16 0356  AST 25 20 18   ALT 25 23 20   ALKPHOS 91 84 72  BILITOT 0.7 1.1 0.9  PROT 7.7 8.3* 7.0  ALBUMIN 4.0 4.0 3.6    Recent Labs Lab 11/10/16 2148  LIPASE 24   No results for input(s): AMMONIA in the last 168 hours. Coagulation Profile:  Recent Labs Lab 11/09/16 0350 11/10/16 2225 11/11/16 0902 11/12/16 0326  INR 2.67 2.95 3.00 2.69   Cardiac Enzymes: No results for input(s): CKTOTAL, CKMB, CKMBINDEX, TROPONINI in the last 168 hours. BNP (last 3 results)  Recent Labs  09/16/16 1444  PROBNP 1,110*   HbA1C: No results for input(s): HGBA1C in the last 72 hours. CBG:  Recent Labs Lab 11/11/16 1612 11/11/16 1929 11/11/16 2254 11/12/16 0328 11/12/16 0759  GLUCAP 121* 118* 118* 135* 128*   Lipid Profile: No results for input(s): CHOL, HDL, LDLCALC, TRIG, CHOLHDL, LDLDIRECT in the last 72  hours. Thyroid Function Tests:  Recent Labs  11/11/16 0356  TSH 0.415   Anemia Panel: No results for input(s): VITAMINB12, FOLATE, FERRITIN, TIBC, IRON, RETICCTPCT in the last 72 hours. Sepsis Labs:  Recent Labs Lab 11/09/16 0404 11/10/16 2335 11/11/16 0252  LATICACIDVEN 1.34 1.27 1.12    Recent Results (from the past 240 hour(s))  MRSA PCR Screening     Status: None   Collection Time: 11/11/16  1:43 PM  Result Value Ref Range Status   MRSA by PCR NEGATIVE NEGATIVE Final    Comment:        The GeneXpert MRSA Assay (FDA approved for NASAL specimens only), is one component of a comprehensive MRSA colonization surveillance program. It is not intended to diagnose MRSA infection nor to guide or monitor treatment for MRSA infections.          Radiology Studies: Ct Soft Tissue Neck W Contrast  Result Date: 11/10/2016 CLINICAL DATA:  76 y/o  M; right neck mass. EXAM: CT NECK WITH CONTRAST TECHNIQUE: Multidetector CT imaging of the neck was performed using the standard protocol following the bolus administration of  intravenous contrast. CONTRAST:  75mL ISOVUE-300 IOPAMIDOL (ISOVUE-300) INJECTION 61% COMPARISON:  None. FINDINGS: Pharynx and larynx: Right base of tongue mass measuring 36 x 32 x 37 mm (AP x ML x CC series 3, image 37 and series 200, image 56). The mass effaces the right vallecula glossopharyngeal sulcus and inferiorly abuts the hyoid bone. The mass invades the tongue 27 mm and extends approximately 9 mm into the contralateral base of tongue. Salivary glands: No inflammation, mass, or stone. Thyroid: Subcentimeter low hypodense foci probably represent nodules. Lymph nodes: Necrotic lymphadenopathy in the right level 2, 3, 4, and 5a/b stations. Left-sided cervical necrotic lymphadenopathy at the level 2 station. The largest lymph node in the right level 2 station measures 41 x 39 x 49 mm (AP x ML x CC). There is stranding surrounding the right upper cervical lymph nodes with irregular margins for example (series 200, image 39) and an indistinct border between the right submandibular gland and adjacent adenopathy (series 3, image 47) indicating probable extra nodal extension. Vascular: Patent carotid and vertebral arteries of the neck. Patent internal jugular veins. Moderate calcific atherosclerosis of carotid bifurcations without high-grade stenosis. Brief submucosal retropharyngeal course of proximal ICA bilaterally. Limited intracranial: Negative. Visualized orbits: Not within the field of view. Mastoids and visualized paranasal sinuses: Right greater than left maxillary sinus mucosal thickening. Skeleton: No acute fracture or suspicious osseous lesion. Moderate cervical spondylosis with predominantly discogenic degenerative changes. No high-grade bony canal stenosis. Upper chest: Aortic atherosclerosis with moderate calcification. Other: None. IMPRESSION: 1. 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 The mass measures up to 37 mm. Visible mass invades base of tongue 27 mm and extends 9 mm to  contralateral base of tongue. 2. Right level 2, 3, 4, 5a/b necrotic lymphadenopathy with findings of extra nodal extension. Left level 2 lymphadenopathy. These results will be called to the ordering clinician or representative by the Radiologist Assistant, and communication documented in the PACS or zVision Dashboard. Electronically Signed   By: Mitzi Hansen M.D.   On: 11/10/2016 15:22   Dg Chest Port 1 View  Result Date: 11/11/2016 CLINICAL DATA:  NG tube placement. EXAM: PORTABLE CHEST 1 VIEW COMPARISON:  11/10/2016 FINDINGS: An enteric tube has been placed and demonstrates a single loop in the neck before coursing through the chest with tip not visualized on this study (see separate abdominal  radiograph for tip and side hole position). A single lead ICD remains in place. The cardiac silhouette remains enlarged. The costophrenic angles were incompletely imaged. No sizable pleural effusion is identified. No confluent airspace opacity, overt pulmonary edema, or pneumothorax is seen. No acute osseous abnormality is identified. IMPRESSION: 1. Enteric tube with single loop in the neck. Tip not imaged on this study, see separate abdominal radiograph report. 2. No evidence of active cardiopulmonary disease. Electronically Signed   By: Sebastian Ache M.D.   On: 11/11/2016 17:09   Dg Abd Acute W/chest  Result Date: 11/10/2016 CLINICAL DATA:  Initial evaluation for acute nausea, vomiting, abdominal pain status post CT earlier today. EXAM: DG ABDOMEN ACUTE W/ 1V CHEST COMPARISON:  Prior CT from 11/09/2016. FINDINGS: Left-sided single lead transvenous pacemaker/ AICD in place. Moderate cardiomegaly. Mediastinal silhouette within normal limits. Aortic atherosclerosis. No focal infiltrates. Mild elevation of the left hemidiaphragm. Mild subsegmental atelectasis within the bilateral lung bases. No focal infiltrates. Mild central perihilar vascular congestion without overt pulmonary edema. No pleural effusion. No  pneumothorax. Scattered gas-filled loops of small bowel present throughout the abdomen, measuring up to 6.3 cm in diameter. These may be more prominent as compared to prior CT, although the direct comparison somewhat difficult. Few scattered air-fluid levels on upright projection. Finding may reflect sequela of a new and/or developing obstructive process. Secreted contrast material within the bladder. Previously noted gallstone noted within the right upper quadrant. No other soft tissue mass or abnormal calcification. No free air. Level degenerative changes noted within the visualized spine. IMPRESSION: 1. Scattered prominent loops of gas-filled small bowel within the abdomen, somewhat more prominent as compared to recent CT from 11/09/2016. Findings are suspicious for possible new and/or developing obstruction. Further evaluation with repeat cross-sectional imaging could be performed as indicated. 2. Mild bibasilar subsegmental atelectasis. No other active cardiopulmonary disease. 3. Cardiomegaly. 4. Aortic atherosclerosis. Electronically Signed   By: Rise Mu M.D.   On: 11/10/2016 23:36   Dg Abd Portable 1v-small Bowel Obstruction Protocol-initial, 8 Hr Delay  Result Date: 11/11/2016 CLINICAL DATA:  Small bowel obstruction EXAM: PORTABLE ABDOMEN - 1 VIEW COMPARISON:  11/11/2016, 11/09/2016. FINDINGS: Generous volume of air throughout small bowel. Enteric contrast has reached the splenic flexure. Enteric tube is just visible at the top of the image, probably within the stomach. IMPRESSION: Enteric contrast throughout the colon. Moderate gaseous distention of small bowel. Electronically Signed   By: Ellery Plunk M.D.   On: 11/11/2016 21:11   Dg Abd Portable 1 View  Result Date: 11/11/2016 CLINICAL DATA:  Initial evaluation for NG tube placement. EXAM: PORTABLE ABDOMEN - 1 VIEW COMPARISON:  Prior radiograph from 11/10/2016. FINDINGS: NG tube in place, with tip at or just above the GE junction,  side hole in the distal esophagus. Advancement by approximately 7-8 cm is recommended. Again, several mildly prominent gas-filled loops of bowel noted within the upper abdomen, better evaluated on prior study. Mild bibasilar subsegmental atelectasis. IMPRESSION: Tip of enteric tube at the level the GE junction, side hole in the distal esophagus. Advancement by 7-8 cm suggested to insure adequate placement side hole within the stomach. Electronically Signed   By: Rise Mu M.D.   On: 11/11/2016 01:08        Scheduled Meds: . chlorhexidine  15 mL Mouth Rinse BID  . insulin aspart  0-9 Units Subcutaneous Q4H  . magnesium sulfate 1 - 4 g bolus IVPB  4 g Intravenous Once  . mouth rinse  15  mL Mouth Rinse q12n4p  . potassium chloride (KCL MULTIRUN) 30 mEq in 265 mL IVPB  30 mEq Intravenous Once   Continuous Infusions: . diltiazem (CARDIZEM) infusion 10 mg/hr (11/12/16 0800)     LOS: 1 day    Time spent: 35 mins    Leonila Speranza, MD [facility] Pager [phone]  If 7PM-7AM, please contact night-coverage [email or web address] Password [facility] 11/12/2016, 9:27 AM

## 2016-11-12 NOTE — Progress Notes (Signed)
Subjective: Patient reports several episodes of flatus Abdomen much softer Follow-up film, contrast in colon  Objective: Vital signs in last 24 hours: Temp:  [98.6 F (37 C)-99.6 F (37.6 C)] 98.7 F (37.1 C) (03/09 0327) Pulse Rate:  [76-151] 101 (03/09 0327) Resp:  [12-27] 27 (03/09 0327) BP: (110-142)/(72-104) 142/82 (03/09 0327) SpO2:  [90 %-95 %] 92 % (03/09 0327) Weight:  [128.2 kg (282 lb 10.1 oz)] 128.2 kg (282 lb 10.1 oz) (03/08 1300) Last BM Date:  (PTA - pt states a couple weeks ago)  Intake/Output from previous day: 03/08 0701 - 03/09 0700 In: 2203 [I.V.:2203] Out: 1275 [Urine:1000; Emesis/NG output:275] Intake/Output this shift: No intake/output data recorded.  General appearance: alert, cooperative and no distress GI: obese, soft, non-tender Umbilical hernia - soft, partially reducible  Lab Results:   Recent Labs  11/11/16 0356 11/12/16 0326  WBC 2.5* 3.9*  HGB 14.2 13.2  HCT 43.9 42.0  PLT 164 153   BMET  Recent Labs  11/11/16 0356 11/12/16 0326  NA 140 141  K 3.7 3.6  CL 105 106  CO2 25 28  GLUCOSE 134* 136*  BUN 22* 14  CREATININE 0.88 0.82  CALCIUM 8.0* 8.3*   PT/INR  Recent Labs  11/11/16 0902 11/12/16 0326  LABPROT 31.8* 29.2*  INR 3.00 2.69   ABG No results for input(s): PHART, HCO3 in the last 72 hours.  Invalid input(s): PCO2, PO2  Studies/Results: Ct Soft Tissue Neck W Contrast  Result Date: 11/10/2016 CLINICAL DATA:  76 y/o  M; right neck mass. EXAM: CT NECK WITH CONTRAST TECHNIQUE: Multidetector CT imaging of the neck was performed using the standard protocol following the bolus administration of intravenous contrast. CONTRAST:  46mL ISOVUE-300 IOPAMIDOL (ISOVUE-300) INJECTION 61% COMPARISON:  None. FINDINGS: Pharynx and larynx: Right base of tongue mass measuring 36 x 32 x 37 mm (AP x ML x CC series 3, image 37 and series 200, image 56). The mass effaces the right vallecula glossopharyngeal sulcus and inferiorly  abuts the hyoid bone. The mass invades the tongue 27 mm and extends approximately 9 mm into the contralateral base of tongue. Salivary glands: No inflammation, mass, or stone. Thyroid: Subcentimeter low hypodense foci probably represent nodules. Lymph nodes: Necrotic lymphadenopathy in the right level 2, 3, 4, and 5a/b stations. Left-sided cervical necrotic lymphadenopathy at the level 2 station. The largest lymph node in the right level 2 station measures 41 x 39 x 49 mm (AP x ML x CC). There is stranding surrounding the right upper cervical lymph nodes with irregular margins for example (series 200, image 39) and an indistinct border between the right submandibular gland and adjacent adenopathy (series 3, image 47) indicating probable extra nodal extension. Vascular: Patent carotid and vertebral arteries of the neck. Patent internal jugular veins. Moderate calcific atherosclerosis of carotid bifurcations without high-grade stenosis. Brief submucosal retropharyngeal course of proximal ICA bilaterally. Limited intracranial: Negative. Visualized orbits: Not within the field of view. Mastoids and visualized paranasal sinuses: Right greater than left maxillary sinus mucosal thickening. Skeleton: No acute fracture or suspicious osseous lesion. Moderate cervical spondylosis with predominantly discogenic degenerative changes. No high-grade bony canal stenosis. Upper chest: Aortic atherosclerosis with moderate calcification. Other: None. IMPRESSION: 1. 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 The mass measures up to 37 mm. Visible mass invades base of tongue 27 mm and extends 9 mm to contralateral base of tongue. 2. Right level 2, 3, 4, 5a/b necrotic lymphadenopathy with findings of extra nodal extension. Left  level 2 lymphadenopathy. These results will be called to the ordering clinician or representative by the Radiologist Assistant, and communication documented in the PACS or zVision Dashboard.  Electronically Signed   By: Kristine Garbe M.D.   On: 11/10/2016 15:22   Dg Chest Port 1 View  Result Date: 11/11/2016 CLINICAL DATA:  NG tube placement. EXAM: PORTABLE CHEST 1 VIEW COMPARISON:  11/10/2016 FINDINGS: An enteric tube has been placed and demonstrates a single loop in the neck before coursing through the chest with tip not visualized on this study (see separate abdominal radiograph for tip and side hole position). A single lead ICD remains in place. The cardiac silhouette remains enlarged. The costophrenic angles were incompletely imaged. No sizable pleural effusion is identified. No confluent airspace opacity, overt pulmonary edema, or pneumothorax is seen. No acute osseous abnormality is identified. IMPRESSION: 1. Enteric tube with single loop in the neck. Tip not imaged on this study, see separate abdominal radiograph report. 2. No evidence of active cardiopulmonary disease. Electronically Signed   By: Logan Bores M.D.   On: 11/11/2016 17:09   Dg Abd Acute W/chest  Result Date: 11/10/2016 CLINICAL DATA:  Initial evaluation for acute nausea, vomiting, abdominal pain status post CT earlier today. EXAM: DG ABDOMEN ACUTE W/ 1V CHEST COMPARISON:  Prior CT from 11/09/2016. FINDINGS: Left-sided single lead transvenous pacemaker/ AICD in place. Moderate cardiomegaly. Mediastinal silhouette within normal limits. Aortic atherosclerosis. No focal infiltrates. Mild elevation of the left hemidiaphragm. Mild subsegmental atelectasis within the bilateral lung bases. No focal infiltrates. Mild central perihilar vascular congestion without overt pulmonary edema. No pleural effusion. No pneumothorax. Scattered gas-filled loops of small bowel present throughout the abdomen, measuring up to 6.3 cm in diameter. These may be more prominent as compared to prior CT, although the direct comparison somewhat difficult. Few scattered air-fluid levels on upright projection. Finding may reflect sequela of a new  and/or developing obstructive process. Secreted contrast material within the bladder. Previously noted gallstone noted within the right upper quadrant. No other soft tissue mass or abnormal calcification. No free air. Level degenerative changes noted within the visualized spine. IMPRESSION: 1. Scattered prominent loops of gas-filled small bowel within the abdomen, somewhat more prominent as compared to recent CT from 11/09/2016. Findings are suspicious for possible new and/or developing obstruction. Further evaluation with repeat cross-sectional imaging could be performed as indicated. 2. Mild bibasilar subsegmental atelectasis. No other active cardiopulmonary disease. 3. Cardiomegaly. 4. Aortic atherosclerosis. Electronically Signed   By: Jeannine Boga M.D.   On: 11/10/2016 23:36   Dg Abd Portable 1v-small Bowel Obstruction Protocol-initial, 8 Hr Delay  Result Date: 11/11/2016 CLINICAL DATA:  Small bowel obstruction EXAM: PORTABLE ABDOMEN - 1 VIEW COMPARISON:  11/11/2016, 11/09/2016. FINDINGS: Generous volume of air throughout small bowel. Enteric contrast has reached the splenic flexure. Enteric tube is just visible at the top of the image, probably within the stomach. IMPRESSION: Enteric contrast throughout the colon. Moderate gaseous distention of small bowel. Electronically Signed   By: Andreas Newport M.D.   On: 11/11/2016 21:11   Dg Abd Portable 1 View  Result Date: 11/11/2016 CLINICAL DATA:  Initial evaluation for NG tube placement. EXAM: PORTABLE ABDOMEN - 1 VIEW COMPARISON:  Prior radiograph from 11/10/2016. FINDINGS: NG tube in place, with tip at or just above the GE junction, side hole in the distal esophagus. Advancement by approximately 7-8 cm is recommended. Again, several mildly prominent gas-filled loops of bowel noted within the upper abdomen, better evaluated on prior study.  Mild bibasilar subsegmental atelectasis. IMPRESSION: Tip of enteric tube at the level the GE junction, side  hole in the distal esophagus. Advancement by 7-8 cm suggested to insure adequate placement side hole within the stomach. Electronically Signed   By: Jeannine Boga M.D.   On: 11/11/2016 01:08    Anti-infectives: Anti-infectives    None      Assessment/Plan: Small bowel obstruction - resolving Clamp NG tube Clear liquids  Patient will need to have umbilical hernia repaired electively.  He was scheduled to have DL and biopsy of a tongue mass by Dr. Melony Overly, as well as excision of a right shoulder lipoma.  That will need to be rescheduled.  The umbilical hernia repair is not urgent.  Imogene Burn. Georgette Dover, MD, [facility] Surgery  General/ Trauma Surgery  11/12/2016 7:49 AM   LOS: 1 day    Emiyah Spraggins K. 11/12/2016

## 2016-11-13 ENCOUNTER — Inpatient Hospital Stay (HOSPITAL_COMMUNITY): Payer: Medicare HMO

## 2016-11-13 LAB — BASIC METABOLIC PANEL
Anion gap: 12 (ref 5–15)
BUN: 12 mg/dL (ref 6–20)
CO2: 22 mmol/L (ref 22–32)
Calcium: 8.4 mg/dL — ABNORMAL LOW (ref 8.9–10.3)
Chloride: 103 mmol/L (ref 101–111)
Creatinine, Ser: 0.73 mg/dL (ref 0.61–1.24)
GFR calc Af Amer: >60 mL/min (ref >60)
GFR calc non Af Amer: >60 mL/min (ref >60)
Glucose, Bld: 138 mg/dL — ABNORMAL HIGH (ref 65–99)
Potassium: 4 mmol/L (ref 3.5–5.1)
Sodium: 137 mmol/L (ref 135–145)

## 2016-11-13 LAB — MAGNESIUM: Magnesium: 2.1 mg/dL (ref 1.7–2.4)

## 2016-11-13 LAB — GLUCOSE, CAPILLARY
Glucose-Capillary: 130 mg/dL — ABNORMAL HIGH (ref 65–99)
Glucose-Capillary: 142 mg/dL — ABNORMAL HIGH (ref 65–99)
Glucose-Capillary: 127 mg/dL — ABNORMAL HIGH (ref 65–99)
Glucose-Capillary: 124 mg/dL — ABNORMAL HIGH (ref 65–99)

## 2016-11-13 LAB — CBC
HCT: 43.9 % (ref 39.0–52.0)
Hemoglobin: 13.9 g/dL (ref 13.0–17.0)
MCH: 29 pg (ref 26.0–34.0)
MCHC: 31.7 g/dL (ref 30.0–36.0)
MCV: 91.5 fL (ref 78.0–100.0)
Platelets: 159 10*3/uL (ref 150–400)
RBC: 4.8 MIL/uL (ref 4.22–5.81)
RDW: 14.8 % (ref 11.5–15.5)
WBC: 5 10*3/uL (ref 4.0–10.5)

## 2016-11-13 LAB — PROTIME-INR
INR: 2.4
Prothrombin Time: 26.6 seconds — ABNORMAL HIGH (ref 11.4–15.2)

## 2016-11-13 LAB — HEMOGLOBIN A1C
Hgb A1c MFr Bld: 6.7 % — ABNORMAL HIGH (ref 4.8–5.6)
Mean Plasma Glucose: 146 mg/dL

## 2016-11-13 MED ORDER — DILTIAZEM HCL-DEXTROSE 100-5 MG/100ML-% IV SOLN (PREMIX)
5.0000 mg/h | INTRAVENOUS | Status: DC
  Administered 2016-11-13: 7.5 mg/h via INTRAVENOUS

## 2016-11-13 NOTE — Progress Notes (Signed)
Subjective:pt with BM he states NGT minimal   No pain or NV  Objective: Vital signs in last 24 hours: Temp:  [98 F (36.7 C)-99 F (37.2 C)] 98 F (36.7 C) (03/10 0717) Pulse Rate:  [99-110] 108 (03/10 0717) Resp:  [12-21] 19 (03/10 0717) BP: (129-143)/(83-106) 143/97 (03/10 0717) SpO2:  [92 %-94 %] 92 % (03/10 0717) Last BM Date: 11/12/16  Intake/Output from previous day: 03/09 0701 - 03/10 0700 In: 800 [P.O.:240; I.V.:195; IV Piggyback:365] Out: 745 [Urine:745] Intake/Output this shift: No intake/output data recorded.  GI: OBESE SOFT nt REDUCIBLE UMBILICAL HERNIA   Lab Results:   Recent Labs  11/12/16 0326 11/13/16 0209  WBC 3.9* 5.0  HGB 13.2 13.9  HCT 42.0 43.9  PLT 153 159   BMET  Recent Labs  11/12/16 0326 11/13/16 0209  NA 141 137  K 3.6 4.0  CL 106 103  CO2 28 22  GLUCOSE 136* 138*  BUN 14 12  CREATININE 0.82 0.73  CALCIUM 8.3* 8.4*   PT/INR  Recent Labs  11/12/16 0326 11/13/16 0209  LABPROT 29.2* 26.6*  INR 2.69 2.40   ABG No results for input(s): PHART, HCO3 in the last 72 hours.  Invalid input(s): PCO2, PO2  Studies/Results: Dg Chest Port 1 View  Result Date: 11/11/2016 CLINICAL DATA:  NG tube placement. EXAM: PORTABLE CHEST 1 VIEW COMPARISON:  11/10/2016 FINDINGS: An enteric tube has been placed and demonstrates a single loop in the neck before coursing through the chest with tip not visualized on this study (see separate abdominal radiograph for tip and side hole position). A single lead ICD remains in place. The cardiac silhouette remains enlarged. The costophrenic angles were incompletely imaged. No sizable pleural effusion is identified. No confluent airspace opacity, overt pulmonary edema, or pneumothorax is seen. No acute osseous abnormality is identified. IMPRESSION: 1. Enteric tube with single loop in the neck. Tip not imaged on this study, see separate abdominal radiograph report. 2. No evidence of active cardiopulmonary  disease. Electronically Signed   By: Logan Bores M.D.   On: 11/11/2016 17:09   Dg Abd Portable 1v  Result Date: 11/13/2016 CLINICAL DATA:  Small bowel obstruction. EXAM: PORTABLE ABDOMEN - 1 VIEW COMPARISON:  None. FINDINGS: There is oral contrast material in the colon. There is a nasogastric tube with the tip projecting over the stomach with the proximal port just beyond the gastroesophageal junction. There are mildly dilated loops of small bowel in the left side of the abdomen measuring up to 4 cm. There is no evidence of pneumoperitoneum, portal venous gas or pneumatosis. There are no pathologic calcifications along the expected course of the ureters. The osseous structures are unremarkable. IMPRESSION: 1. Persistent mildly dilated loops of small bowel in the left side of the abdomen measuring up to 4 cm with oral contrast material within the colon. This may reflect a partial small bowel obstruction. No significant interval change compared with 11/11/2016. 2. nasogastric tube with the tip projecting over the stomach with the proximal port just beyond the gastroesophageal junction. Recommend advancing the nasogastric tube 10 cm. Electronically Signed   By: Kathreen Devoid   On: 11/13/2016 08:56   Dg Abd Portable 1v-small Bowel Obstruction Protocol-initial, 8 Hr Delay  Result Date: 11/11/2016 CLINICAL DATA:  Small bowel obstruction EXAM: PORTABLE ABDOMEN - 1 VIEW COMPARISON:  11/11/2016, 11/09/2016. FINDINGS: Generous volume of air throughout small bowel. Enteric contrast has reached the splenic flexure. Enteric tube is just visible at the top of the image,  probably within the stomach. IMPRESSION: Enteric contrast throughout the colon. Moderate gaseous distention of small bowel. Electronically Signed   By: Andreas Newport M.D.   On: 11/11/2016 21:11    Anti-infectives: Anti-infectives    None      Assessment/Plan: Patient Active Problem List   Diagnosis Date Noted  . Tongue mass: Per Ct neck  11/10/2016 11/12/2016  . SBO (small bowel obstruction) 11/11/2016  . Encounter for therapeutic drug monitoring 10/25/2013  . Diabetic peripheral neuropathy ([place]) 10/14/2011  . Hypotension 08/20/2011  . Acute renal failure ([place]) 08/20/2011  . OSA (obstructive sleep apnea) 04/15/2011  . ICD-[employer] 03/30/2011  . Chronic systolic CHF (congestive heart failure) ([place]) 03/30/2011  . Atrial fibrillation with RVR ([place]) 12/14/2010  . Nonischemic cardiomyopathy ([place]) 12/14/2010  . Type 2 diabetes mellitus ([place]) 12/14/2010  . Hyperlipidemia 12/14/2010   D/C  NGT  Advance diet   Films better   LOS: 2 days    Yanky Vanderburg A. 11/13/2016

## 2016-11-13 NOTE — Progress Notes (Addendum)
PROGRESS NOTE    Collin [name]  EXB:[id number] DOB: August 02, 1941 DOA: 11/10/2016 PCP: [name], MD    Brief Narrative:   Collin [name] is a 76 y.o. male with atrial fibrillation diabetes mellitus type 2, nonischemic cardiomyopathy and hyperlipidemia presents to the ER with complaints of persistent nausea vomiting. Patient has had a neck CT yesterday and was told to have good fluid intake. Following which patient started developing multiple episodes of vomiting. Last bowel movement was 3 days ago. Denies any abdominal pain. Patient did come with abdominal pain to the ER 3 days ago and at that time CT of the abdomen did not show any obstruction. Patient was originally scheduled for surgery of the neck mass by Dr. Ezzard Standing on Friday, March 9.  ED Course: Acute abdominal series shows possible small bowel obstruction. Patient had NG tube placed and had 1700 mL of fluid suctioned. On-call general surgeon Dr. Lindie Spruce was consulted. Patient is being admitted for small bowel obstruction. Patient started developing A. fib with RVR while waiting in the ER.   Assessment & Plan:   Principal Problem:   SBO (small bowel obstruction) Active Problems:   Atrial fibrillation with RVR (HCC)   Nonischemic cardiomyopathy (HCC)   Type 2 diabetes mellitus (HCC)   Tongue mass: Per Ct neck 11/10/2016  #1 small bowel obstruction Resolving per abdominal films. Patient noted to have contrast throughout the colon. Moderate gaseous distention of the small bowel per abdominal films of 11/11/2016. Repeat abdominal films on 11/13/2016 with persistent mildly dilated loops of bowel in the left side of the abdomen measuring up to 4 cm with oral contrast material within the colon. This may reflect partial small bowel obstruction. No significant change compared to 11/11/2016. Patient passing flatus. Patient states had 2 bowel movements yesterday. Abdomen is soft and nontender to palpation. No further nausea or emesis. NG  tube has been clamped and patient on clears per general surgery. Patient tolerating clear liquids. Replete electrolytes. Mobilize. Per general surgery.  #2 A. fib with RVR Patient currently on Cardizem drip. Patient in A. fib with RVR likely secondary to problem #1. Chest x-ray negative for any acute infiltrates. INR is therapeutic. Heart rate improving. Coumadin on hold in anticipation of tongue biopsy to possibly be done on Monday or Tuesday versus outpatient setting. Continue to hold anticoagulation. Once small bowel obstruction has resolved and patient tolerating oral intake will transition to his home regimen of Coreg.  #3 diabetes mellitus type 2 Hemoglobin A1c was 6.9 on 11/13/2015. Repeat hemoglobin A1c = 6.7. Hold oral hypoglycemic agents. Patient has been placed on clear liquids and as such will continue sliding scale insulin. Follow.  #4 hyperlipidemia Statin on hold. Resume once patient tolerating oral intake and small bowel obstruction has resolved.  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 Patient was supposed to have a biopsy done and elective removal of shoulder lipoma per Dr.Newman, ENT on 11/12/2016, however this will need to be postponed secondary to acute illness and hospitalization. Will continue to hold patient's anticoagulation. Case was discussed with Dr. Ezzard Standing on 11/12/2016, and if patient continues to improve and small bowel obstruction has resolved Dr. Ezzard Standing planning on biopsy of tongue mass on Monday, 11/15/2016 or Tuesday, 11/16/2016. Per ENT.    DVT prophylaxis: INR therapeutic. Code Status: Full Family Communication: (Specify name, relationship & date discussed. NO "discussed with patient") Disposition Plan: Remain the step down  unit while on Cardizem drip for A. fib with RVR. Hopefully home when small bowel obstruction has resolved and tolerating oral intake and A. fib  with RVR controlled.   Consultants:   General surgery: Dr. Lindie Spruce 11/11/2016  ENT via telephone Dr. Ezzard Standing 11/11/2016  Procedures:   CT soft tissue neck 11/10/2016  Acute abdominal series 11/10/2016  Chest x-ray 11/11/2016  Abdominal films 11/11/2016, 11/13/2016  Antimicrobials:   None   Subjective: Patient states he feels well. Denies any shortness of breath. No chest pain. No abdominal pain. No nausea. No vomiting. NG tube has been clamped. Patient tolerating clears. Patient states he's passing flatus. Patient states had 2 BM yesterday. C/o sore throat where NGT is.  Objective: Vitals:   11/12/16 1938 11/12/16 2320 11/13/16 0332 11/13/16 0717  BP: (!) 139/96 132/83 135/89 (!) 143/97  Pulse: (!) 108 (!) 110 99 (!) 108  Resp: 18 (!) 21 15 19   Temp: 98.7 F (37.1 C) 98.3 F (36.8 C) 98 F (36.7 C) 98 F (36.7 C)  TempSrc: Oral Oral Oral Oral  SpO2: 94% 92% 92% 92%  Weight:      Height:        Intake/Output Summary (Last 24 hours) at 11/13/16 0922 Last data filed at 11/13/16 0700  Gross per 24 hour  Intake           539.97 ml  Output              745 ml  Net          -205.03 ml   Filed Weights   11/10/16 2142 11/11/16 1300  Weight: 131.5 kg (290 lb) 128.2 kg (282 lb 10.1 oz)    Examination:  General exam: Appears calm and comfortable  Respiratory system: Clear to auscultation. Respiratory effort normal. Cardiovascular system: Irregularly irregular. No JVD, murmurs, rubs, gallops or clicks. Trace-1+ BLE edema. Gastrointestinal system: Abdomen is obese, less distended, softer and nontender. No organomegaly or masses felt. Normal bowel sounds heard. Central nervous system: Alert and oriented. No focal neurological deficits. Extremities: Symmetric 5 x 5 power. Skin: No rashes, lesions or ulcers Psychiatry: Judgement and insight appear normal. Mood & affect appropriate.     Data Reviewed: I have personally reviewed following labs and imaging  studies  CBC:  Recent Labs Lab 11/09/16 0350 11/10/16 2148 11/11/16 0356 11/12/16 0326 11/13/16 0209  WBC 5.5 3.5* 2.5* 3.9* 5.0  NEUTROABS 4.6 2.6 1.5*  --   --   HGB 14.4 15.6 14.2 13.2 13.9  HCT 44.0 48.0 43.9 42.0 43.9  MCV 90.5 90.2 90.7 92.1 91.5  PLT 161 177 164 153 159   Basic Metabolic Panel:  Recent Labs Lab 11/09/16 0350 11/10/16 2148 11/11/16 0356 11/12/16 0326 11/13/16 0209  NA 140 138 140 141 137  K 4.4 3.9 3.7 3.6 4.0  CL 106 101 105 106 103  CO2 23 27 25 28 22   GLUCOSE 161* 165* 134* 136* 138*  BUN 18 25* 22* 14 12  CREATININE 0.83 0.97 0.88 0.82 0.73  CALCIUM 9.4 9.0 8.0* 8.3* 8.4*  MG  --   --   --  1.8 2.1   GFR: Estimated Creatinine Clearance: 111.9 mL/min (by C-G formula based on SCr of 0.73 mg/dL). Liver Function Tests:  Recent Labs Lab 11/09/16 0350 11/10/16 2148 11/11/16 0356  AST 25 20 18   ALT 25 23 20   ALKPHOS 91 84 72  BILITOT 0.7 1.1 0.9  PROT 7.7 8.3* 7.0  ALBUMIN 4.0 4.0 3.6  Recent Labs Lab 11/10/16 2148  LIPASE 24   No results for input(s): AMMONIA in the last 168 hours. Coagulation Profile:  Recent Labs Lab 11/09/16 0350 11/10/16 2225 11/11/16 0902 11/12/16 0326 11/13/16 0209  INR 2.67 2.95 3.00 2.69 2.40   Cardiac Enzymes: No results for input(s): CKTOTAL, CKMB, CKMBINDEX, TROPONINI in the last 168 hours. BNP (last 3 results)  Recent Labs  09/16/16 1444  PROBNP 1,110*   HbA1C:  Recent Labs  11/12/16 1006  HGBA1C 6.7*   CBG:  Recent Labs Lab 11/12/16 0759 11/12/16 1201 11/12/16 1745 11/12/16 2205 11/13/16 0753  GLUCAP 128* 159* 154* 130* 130*   Lipid Profile: No results for input(s): CHOL, HDL, LDLCALC, TRIG, CHOLHDL, LDLDIRECT in the last 72 hours. Thyroid Function Tests:  Recent Labs  11/11/16 0356  TSH 0.415   Anemia Panel: No results for input(s): VITAMINB12, FOLATE, FERRITIN, TIBC, IRON, RETICCTPCT in the last 72 hours. Sepsis Labs:  Recent Labs Lab 11/09/16 0404  11/10/16 2335 11/11/16 0252  LATICACIDVEN 1.34 1.27 1.12    Recent Results (from the past 240 hour(s))  MRSA PCR Screening     Status: None   Collection Time: 11/11/16  1:43 PM  Result Value Ref Range Status   MRSA by PCR NEGATIVE NEGATIVE Final    Comment:        The GeneXpert MRSA Assay (FDA approved for NASAL specimens only), is one component of a comprehensive MRSA colonization surveillance program. It is not intended to diagnose MRSA infection nor to guide or monitor treatment for MRSA infections.          Radiology Studies: Dg Chest Port 1 View  Result Date: 11/11/2016 CLINICAL DATA:  NG tube placement. EXAM: PORTABLE CHEST 1 VIEW COMPARISON:  11/10/2016 FINDINGS: An enteric tube has been placed and demonstrates a single loop in the neck before coursing through the chest with tip not visualized on this study (see separate abdominal radiograph for tip and side hole position). A single lead ICD remains in place. The cardiac silhouette remains enlarged. The costophrenic angles were incompletely imaged. No sizable pleural effusion is identified. No confluent airspace opacity, overt pulmonary edema, or pneumothorax is seen. No acute osseous abnormality is identified. IMPRESSION: 1. Enteric tube with single loop in the neck. Tip not imaged on this study, see separate abdominal radiograph report. 2. No evidence of active cardiopulmonary disease. Electronically Signed   By: Sebastian Ache M.D.   On: 11/11/2016 17:09   Dg Abd Portable 1v  Result Date: 11/13/2016 CLINICAL DATA:  Small bowel obstruction. EXAM: PORTABLE ABDOMEN - 1 VIEW COMPARISON:  None. FINDINGS: There is oral contrast material in the colon. There is a nasogastric tube with the tip projecting over the stomach with the proximal port just beyond the gastroesophageal junction. There are mildly dilated loops of small bowel in the left side of the abdomen measuring up to 4 cm. There is no evidence of pneumoperitoneum, portal  venous gas or pneumatosis. There are no pathologic calcifications along the expected course of the ureters. The osseous structures are unremarkable. IMPRESSION: 1. Persistent mildly dilated loops of small bowel in the left side of the abdomen measuring up to 4 cm with oral contrast material within the colon. This may reflect a partial small bowel obstruction. No significant interval change compared with 11/11/2016. 2. nasogastric tube with the tip projecting over the stomach with the proximal port just beyond the gastroesophageal junction. Recommend advancing the nasogastric tube 10 cm. Electronically Signed   By: Alan Ripper  Patel   On: 11/13/2016 08:56   Dg Abd Portable 1v-small Bowel Obstruction Protocol-initial, 8 Hr Delay  Result Date: 11/11/2016 CLINICAL DATA:  Small bowel obstruction EXAM: PORTABLE ABDOMEN - 1 VIEW COMPARISON:  11/11/2016, 11/09/2016. FINDINGS: Generous volume of air throughout small bowel. Enteric contrast has reached the splenic flexure. Enteric tube is just visible at the top of the image, probably within the stomach. IMPRESSION: Enteric contrast throughout the colon. Moderate gaseous distention of small bowel. Electronically Signed   By: Ellery Plunk M.D.   On: 11/11/2016 21:11        Scheduled Meds: . chlorhexidine  15 mL Mouth Rinse BID  . insulin aspart  0-15 Units Subcutaneous TID WC  . mouth rinse  15 mL Mouth Rinse q12n4p   Continuous Infusions: . diltiazem (CARDIZEM) infusion 7.5 mg/hr (11/12/16 2331)     LOS: 2 days    Time spent: 35 mins    Hephzibah Strehle, MD [facility] Pager [phone]  If 7PM-7AM, please contact night-coverage [email or web address] Password [email or web address] 11/13/2016, 9:22 AM

## 2016-11-14 ENCOUNTER — Ambulatory Visit: Payer: Self-pay | Admitting: Otolaryngology

## 2016-11-14 DIAGNOSIS — L8990 Pressure ulcer of unspecified site, unspecified stage: Secondary | ICD-10-CM | POA: Insufficient documentation

## 2016-11-14 LAB — GLUCOSE, CAPILLARY
Glucose-Capillary: 141 mg/dL — ABNORMAL HIGH (ref 65–99)
Glucose-Capillary: 124 mg/dL — ABNORMAL HIGH (ref 65–99)
Glucose-Capillary: 102 mg/dL — ABNORMAL HIGH (ref 65–99)
Glucose-Capillary: 152 mg/dL — ABNORMAL HIGH (ref 65–99)

## 2016-11-14 LAB — BASIC METABOLIC PANEL
Anion gap: 8 (ref 5–15)
BUN: 9 mg/dL (ref 6–20)
CO2: 29 mmol/L (ref 22–32)
Calcium: 8.8 mg/dL — ABNORMAL LOW (ref 8.9–10.3)
Chloride: 100 mmol/L — ABNORMAL LOW (ref 101–111)
Creatinine, Ser: 0.68 mg/dL (ref 0.61–1.24)
GFR calc Af Amer: >60 mL/min (ref >60)
GFR calc non Af Amer: >60 mL/min (ref >60)
Glucose, Bld: 148 mg/dL — ABNORMAL HIGH (ref 65–99)
Potassium: 3.4 mmol/L — ABNORMAL LOW (ref 3.5–5.1)
Sodium: 137 mmol/L (ref 135–145)

## 2016-11-14 LAB — CBC
HCT: 44.5 % (ref 39.0–52.0)
Hemoglobin: 14.2 g/dL (ref 13.0–17.0)
MCH: 29 pg (ref 26.0–34.0)
MCHC: 31.9 g/dL (ref 30.0–36.0)
MCV: 90.8 fL (ref 78.0–100.0)
Platelets: 155 10*3/uL (ref 150–400)
RBC: 4.9 MIL/uL (ref 4.22–5.81)
RDW: 14.5 % (ref 11.5–15.5)
WBC: 5.1 10*3/uL (ref 4.0–10.5)

## 2016-11-14 LAB — PROTIME-INR
INR: 1.91
Prothrombin Time: 22.2 seconds — ABNORMAL HIGH (ref 11.4–15.2)

## 2016-11-14 LAB — MAGNESIUM: Magnesium: 2.1 mg/dL (ref 1.7–2.4)

## 2016-11-14 MED ORDER — BISACODYL 5 MG PO TBEC
5.0000 mg | DELAYED_RELEASE_TABLET | Freq: Once | ORAL | Status: AC
  Administered 2016-11-14: 5 mg via ORAL
  Filled 2016-11-14: qty 1

## 2016-11-14 MED ORDER — POTASSIUM CHLORIDE CRYS ER 20 MEQ PO TBCR
40.0000 meq | EXTENDED_RELEASE_TABLET | Freq: Once | ORAL | Status: AC
  Administered 2016-11-14: 40 meq via ORAL
  Filled 2016-11-14: qty 2

## 2016-11-14 MED ORDER — ENOXAPARIN SODIUM 80 MG/0.8ML ~~LOC~~ SOLN
65.0000 mg | SUBCUTANEOUS | Status: DC
  Administered 2016-11-14 – 2016-11-15 (×2): 65 mg via SUBCUTANEOUS
  Filled 2016-11-14 (×2): qty 0.8

## 2016-11-14 MED ORDER — CARVEDILOL 25 MG PO TABS
25.0000 mg | ORAL_TABLET | Freq: Two times a day (BID) | ORAL | Status: DC
  Administered 2016-11-14 – 2016-11-17 (×7): 25 mg via ORAL
  Filled 2016-11-14 (×7): qty 1

## 2016-11-14 MED ORDER — WHITE PETROLATUM GEL
Status: AC
  Administered 2016-11-14: 1
  Filled 2016-11-14: qty 1

## 2016-11-14 MED ORDER — PRAVASTATIN SODIUM 40 MG PO TABS
40.0000 mg | ORAL_TABLET | Freq: Every day | ORAL | Status: DC
  Administered 2016-11-14 – 2016-11-17 (×3): 40 mg via ORAL
  Filled 2016-11-14 (×3): qty 1

## 2016-11-14 MED ORDER — DIGOXIN 125 MCG PO TABS
0.1250 mg | ORAL_TABLET | Freq: Every day | ORAL | Status: DC
  Administered 2016-11-14 – 2016-11-17 (×4): 0.125 mg via ORAL
  Filled 2016-11-14 (×4): qty 1

## 2016-11-14 MED ORDER — ONDANSETRON 4 MG PO TBDP: 4.0000 mg | ORAL_TABLET | Freq: Three times a day (TID) | ORAL | Status: DC | PRN

## 2016-11-14 MED ORDER — BISACODYL 5 MG PO TBEC: 5.0000 mg | DELAYED_RELEASE_TABLET | Freq: Every day | ORAL | Status: DC | PRN

## 2016-11-14 NOTE — Evaluation (Signed)
Physical Therapy Evaluation Patient Details Name: Collin [name] MRN: [id number] DOB: 10/22/1940 Today's Date: 11/14/2016   History of Present Illness  Patient is a 76 yo male admitted 11/10/16 with N/V.  Patient with incarcerated hernia with SBO.  In ED developed Afib with RVR.     PMH:  Afib, DM, obesity, NICM, HLD, OSA, ICD, Rt tongue mass scheduled for surgery Tues 11/16/16.  Clinical Impression  Patient presents with problems listed below.  Will benefit from acute PT to maximize functional independence prior to discharge home with wife.  Feel patient will progress well with mobility - do not anticipate need for f/u PT at discharge.  Patient declined RW for home.    Follow Up Recommendations No PT follow up;Supervision for mobility/OOB    Equipment Recommendations  None recommended by PT (Patient declined RW)    Recommendations for Other Services       Precautions / Restrictions Precautions Precautions: None Restrictions Weight Bearing Restrictions: No      Mobility  Bed Mobility               General bed mobility comments: Patient OOB as PT entered room  Transfers Overall transfer level: Needs assistance Equipment used: Rolling walker (2 wheeled) Transfers: Sit to/from Stand Sit to Stand: Min assist         General transfer comment: Verbal cues for hand placement.  Assist to control descent into recliner.  Ambulation/Gait Ambulation/Gait assistance: Supervision Ambulation Distance (Feet): 220 Feet Assistive device: Rolling walker (2 wheeled) Gait Pattern/deviations: Step-through pattern;Decreased stride length;Trunk flexed;Wide base of support Gait velocity: decreased Gait velocity interpretation: Below normal speed for age/gender General Gait Details: Verbal cues for safe use of RW.  Patient with steady gait with RW.  Stairs            Wheelchair Mobility    Modified Rankin (Stroke Patients Only)       Balance Overall balance assessment:  Needs assistance         Standing balance support: No upper extremity supported;During functional activity Standing balance-Leahy Scale: Fair                               Pertinent Vitals/Pain Pain Assessment: No/denies pain    Home Living Family/patient expects to be discharged to:: Private residence Living Arrangements: Spouse/significant other Available Help at Discharge: Family;Available 24 hours/day Type of Home: House Home Access: Stairs to enter Entrance Stairs-Rails: Doctor, general practice of Steps: 4 Home Layout: Multi-level;Laundry or work area in basement;Bed/bath upstairs Home Equipment: Gilmer Mor - single point;Grab bars - tub/shower      Prior Function Level of Independence: Independent;Needs assistance   Gait / Transfers Assistance Needed: Ambulates independently with no assistive device  ADL's / Homemaking Assistance Needed: Independent with bathing, dressing.  Wife prepares meals and does housekeeping.        Hand Dominance        Extremity/Trunk Assessment   Upper Extremity Assessment Upper Extremity Assessment: Overall WFL for tasks assessed    Lower Extremity Assessment Lower Extremity Assessment: Generalized weakness       Communication   Communication: No difficulties  Cognition Arousal/Alertness: Awake/alert Behavior During Therapy: WFL for tasks assessed/performed Overall Cognitive Status: Within Functional Limits for tasks assessed                      General Comments      Exercises  Assessment/Plan    PT Assessment Patient needs continued PT services  PT Problem List Decreased strength;Decreased balance;Decreased mobility;Obesity       PT Treatment Interventions DME instruction;Gait training;Stair training;Functional mobility training;Therapeutic activities;Patient/family education    PT Goals (Current goals can be found in the Care Plan section)  Acute Rehab PT Goals Patient Stated Goal:  "To get through all this surgery" PT Goal Formulation: With patient/family Time For Goal Achievement: 11/21/16 Potential to Achieve Goals: Good    Frequency Min 3X/week   Barriers to discharge        Co-evaluation               End of Session Equipment Utilized During Treatment: Gait belt Activity Tolerance: Patient tolerated treatment well Patient left: in chair;with call bell/phone within reach;with family/visitor present Nurse Communication: Mobility status PT Visit Diagnosis: Unsteadiness on feet (R26.81);Other abnormalities of gait and mobility (R26.89);Muscle weakness (generalized) (M62.81)         Time: 9528-4132 PT Time Calculation (min) (ACUTE ONLY): 32 min   Charges:   PT Evaluation $PT Eval Moderate Complexity: 1 Procedure PT Treatments $Gait Training: 8-22 mins   PT G Codes:         Vena Austria 15-Nov-2016, 5:09 PM Durenda Hurt. Renaldo Fiddler, [facility] Acute Rehab Services Pager [phone]

## 2016-11-14 NOTE — Consult Note (Signed)
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 He is scheduled for direct laryngoscopy and biopsy in [facility] OR on Tuesday at 9:30

## 2016-11-14 NOTE — Progress Notes (Signed)
PROGRESS NOTE    Collin [name]  GEX:[id number] DOB: [birth year] DOA: 11/10/2016 PCP: [name], MD    Brief Narrative:   Collin [name] is a 76 y.o. male with atrial fibrillation diabetes mellitus type 2, nonischemic cardiomyopathy and hyperlipidemia presents to the ER with complaints of persistent nausea vomiting. Patient has had a neck CT yesterday and was told to have good fluid intake. Following which patient started developing multiple episodes of vomiting. Last bowel movement was 3 days ago. Denies any abdominal pain. Patient did come with abdominal pain to the ER 3 days ago and at that time CT of the abdomen did not show any obstruction. Patient was originally scheduled for surgery of the neck mass by Dr. Ezzard Standing on Friday, March 9.  ED Course: Acute abdominal series shows possible small bowel obstruction. Patient had NG tube placed and had 1700 mL of fluid suctioned. On-call general surgeon Dr. Lindie Spruce was consulted. Patient is being admitted for small bowel obstruction. Patient started developing A. fib with RVR while waiting in the ER.   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 Patient noted to have contrast throughout the colon. Moderate gaseous distention of the small bowel per abdominal films of 11/11/2016. Repeat abdominal films on 11/13/2016 with persistent mildly dilated loops of bowel in the left side of the abdomen measuring up to 4 cm with oral contrast material within the colon. This may reflect partial small bowel obstruction. No significant change compared to 11/11/2016. Patient passing flatus. Patient states had bowel movements yesterday. Abdomen is soft and nontender to palpation. No  further nausea or emesis. NG tube has been discontinued per general surgery. Patient tolerating clear liquids. Replete electrolytes. Mobilize. Advance to full liquid diet. Per general surgery.  #2 A. fib with RVR/NICM Patient currently on Cardizem drip. Patient in A. fib with RVR likely secondary to problem #1. Chest x-ray negative for any acute infiltrates. INR is therapeutic. Heart rate improving. Coumadin on hold in anticipation of tongue biopsy to possibly be done on Monday or Tuesday versus outpatient setting. Continue to hold anticoagulation. Will transition patient back to home regimen of Coreg and digoxin and discontinue Cardizem drip 2 hours after the Coreg and digoxin have been given. Follow heart rate.  #3 diabetes mellitus type 2 Hemoglobin A1c was 6.9 on 11/13/2015. Repeat hemoglobin A1c = 6.7. Hold oral hypoglycemic agents. Continue sliding scale insulin. Follow.  #4 hyperlipidemia Statin on hold. Resume once patient tolerating oral intake and small bowel obstruction has resolved.  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 Patient was supposed to have a biopsy done and elective removal of shoulder lipoma per Dr.Newman, ENT on 11/12/2016, however this will need to be postponed secondary to acute illness and hospitalization. Will continue to hold patient's anticoagulation. INR currently at 1.91. Case was discussed with Dr. Ezzard Standing on 11/12/2016, and if patient continues to improve and small bowel obstruction has resolved Dr. Ezzard Standing planning on biopsy of tongue mass on Monday, 11/15/2016 or Tuesday, 11/16/2016. Per ENT. Will place on incentive spirometry to optimize pulmonary function.  #6 pressure injury of skin Frequent turns.  DVT prophylaxis: Lovenox. Code Status: Full Family Communication: Updated patient. No family at bedside. Disposition Plan: Remain the step down unit while on  Cardizem drip for A. fib with RVR. Hopefully home when small bowel obstruction has resolved and tolerating oral intake and A. fib with RVR controlled.   Consultants:   General surgery: Dr. Lindie Spruce 11/11/2016  ENT via telephone Dr. Ezzard Standing 11/11/2016  Procedures:   CT soft tissue neck 11/10/2016  Acute abdominal series 11/10/2016  Chest x-ray 11/11/2016  Abdominal films 11/11/2016, 11/13/2016  Antimicrobials:   None   Subjective: Patient states he feels well. Denies any shortness of breath. No chest pain. No abdominal pain. No nausea. No vomiting. NG tube has been removed. Patient tolerating clears. Patient states he's passing flatus. Patient states had BM yesterday that was a small ball.   Objective: Vitals:   11/13/16 1943 11/13/16 2332 11/14/16 0331 11/14/16 0803  BP: (!) 138/95 139/85 115/89 (!) 137/98  Pulse: (!) 114 90 99 87  Resp: 20 19 19    Temp: 98.1 F (36.7 C) 98 F (36.7 C) 97.7 F (36.5 C) 98.8 F (37.1 C)  TempSrc: Oral Oral Oral Oral  SpO2: 97% 95% 94% 93%  Weight:      Height:        Intake/Output Summary (Last 24 hours) at 11/14/16 0913 Last data filed at 11/14/16 0640  Gross per 24 hour  Intake              300 ml  Output             1750 ml  Net            -1450 ml   Filed Weights   11/10/16 2142 11/11/16 1300  Weight: 131.5 kg (290 lb) 128.2 kg (282 lb 10.1 oz)    Examination:  General exam: Appears calm and comfortable  Respiratory system: Clear to auscultation. Respiratory effort normal. Cardiovascular system: Irregularly irregular. No JVD, murmurs, rubs, gallops or clicks. Trace-1+ BLE edema. Gastrointestinal system: Abdomen is obese, less distended, softer and nontender. No organomegaly or masses felt. Normal bowel sounds heard. Central nervous system: Alert and oriented. No focal neurological deficits. Extremities: Symmetric 5 x 5 power. Skin: No rashes, lesions or ulcers Psychiatry: Judgement and insight appear normal. Mood &  affect appropriate.     Data Reviewed: I have personally reviewed following labs and imaging studies  CBC:  Recent Labs Lab 11/09/16 0350 11/10/16 2148 11/11/16 0356 11/12/16 0326 11/13/16 0209 11/14/16 0417  WBC 5.5 3.5* 2.5* 3.9* 5.0 5.1  NEUTROABS 4.6 2.6 1.5*  --   --   --   HGB 14.4 15.6 14.2 13.2 13.9 14.2  HCT 44.0 48.0 43.9 42.0 43.9 44.5  MCV 90.5 90.2 90.7 92.1 91.5 90.8  PLT 161 177 164 153 159 155   Basic Metabolic Panel:  Recent Labs Lab 11/10/16 2148 11/11/16 0356 11/12/16 0326 11/13/16 0209 11/14/16 0417  NA 138 140 141 137 137  K 3.9 3.7 3.6 4.0 3.4*  CL 101 105 106 103 100*  CO2 27 25 28 22 29   GLUCOSE 165* 134* 136* 138* 148*  BUN 25* 22* 14 12 9   CREATININE 0.97 0.88 0.82 0.73 0.68  CALCIUM 9.0 8.0* 8.3* 8.4* 8.8*  MG  --   --  1.8 2.1 2.1   GFR: Estimated Creatinine Clearance: 111.9 mL/min (by C-G formula based on SCr of 0.68 mg/dL). Liver Function Tests:  Recent Labs Lab 11/09/16 0350 11/10/16 2148 11/11/16 0356  AST  25 20 18   ALT 25 23 20   ALKPHOS 91 84 72  BILITOT 0.7 1.1 0.9  PROT 7.7 8.3* 7.0  ALBUMIN 4.0 4.0 3.6    Recent Labs Lab 11/10/16 2148  LIPASE 24   No results for input(s): AMMONIA in the last 168 hours. Coagulation Profile:  Recent Labs Lab 11/10/16 2225 11/11/16 0902 11/12/16 0326 11/13/16 0209 11/14/16 0417  INR 2.95 3.00 2.69 2.40 1.91   Cardiac Enzymes: No results for input(s): CKTOTAL, CKMB, CKMBINDEX, TROPONINI in the last 168 hours. BNP (last 3 results)  Recent Labs  09/16/16 1444  PROBNP 1,110*   HbA1C:  Recent Labs  11/12/16 1006  HGBA1C 6.7*   CBG:  Recent Labs Lab 11/13/16 0753 11/13/16 1153 11/13/16 1645 11/13/16 2137 11/14/16 0759  GLUCAP 130* 142* 127* 124* 141*   Lipid Profile: No results for input(s): CHOL, HDL, LDLCALC, TRIG, CHOLHDL, LDLDIRECT in the last 72 hours. Thyroid Function Tests: No results for input(s): TSH, T4TOTAL, FREET4, T3FREE, THYROIDAB in the  last 72 hours. Anemia Panel: No results for input(s): VITAMINB12, FOLATE, FERRITIN, TIBC, IRON, RETICCTPCT in the last 72 hours. Sepsis Labs:  Recent Labs Lab 11/09/16 0404 11/10/16 2335 11/11/16 0252  LATICACIDVEN 1.34 1.27 1.12    Recent Results (from the past 240 hour(s))  MRSA PCR Screening     Status: None   Collection Time: 11/11/16  1:43 PM  Result Value Ref Range Status   MRSA by PCR NEGATIVE NEGATIVE Final    Comment:        The GeneXpert MRSA Assay (FDA approved for NASAL specimens only), is one component of a comprehensive MRSA colonization surveillance program. It is not intended to diagnose MRSA infection nor to guide or monitor treatment for MRSA infections.          Radiology Studies: Dg Abd Portable 1v  Result Date: 11/13/2016 CLINICAL DATA:  Small bowel obstruction. EXAM: PORTABLE ABDOMEN - 1 VIEW COMPARISON:  None. FINDINGS: There is oral contrast material in the colon. There is a nasogastric tube with the tip projecting over the stomach with the proximal port just beyond the gastroesophageal junction. There are mildly dilated loops of small bowel in the left side of the abdomen measuring up to 4 cm. There is no evidence of pneumoperitoneum, portal venous gas or pneumatosis. There are no pathologic calcifications along the expected course of the ureters. The osseous structures are unremarkable. IMPRESSION: 1. Persistent mildly dilated loops of small bowel in the left side of the abdomen measuring up to 4 cm with oral contrast material within the colon. This may reflect a partial small bowel obstruction. No significant interval change compared with 11/11/2016. 2. nasogastric tube with the tip projecting over the stomach with the proximal port just beyond the gastroesophageal junction. Recommend advancing the nasogastric tube 10 cm. Electronically Signed   By: Elige Ko   On: 11/13/2016 08:56        Scheduled Meds: . chlorhexidine  15 mL Mouth Rinse  BID  . enoxaparin (LOVENOX) injection  65 mg Subcutaneous Q24H  . insulin aspart  0-15 Units Subcutaneous TID WC  . mouth rinse  15 mL Mouth Rinse q12n4p  . potassium chloride  40 mEq Oral Once   Continuous Infusions: . dilTIAZem HCl-Dextrose 7.5 mg/hr (11/13/16 2304)     LOS: 3 days    Time spent: 35 mins    THOMPSON,DANIEL, MD [facility] Pager [phone]  If 7PM-7AM, please contact night-coverage [email or web address] Password [facility] 11/14/2016, 9:13 AM

## 2016-11-14 NOTE — Progress Notes (Signed)
Subjective: He has been on clears and has had 3 Bm's this AM.. Diet has been increased to Full liquids.    Objective: Vital signs in last 24 hours: Temp:  [97.7 F (36.5 C)-99.4 F (37.4 C)] 98.8 F (37.1 C) (03/11 0803) Pulse Rate:  [87-121] 87 (03/11 0803) Resp:  [19-23] 19 (03/11 0331) BP: (115-152)/(85-98) 137/98 (03/11 0803) SpO2:  [93 %-97 %] 93 % (03/11 0803) Last BM Date: 11/13/16 120 PO recorded IV 180 recorded Urine 1750 Stool x 1 recorded Afebrile, VSS, some tachycardia and high BP K+ 3.4 INR 1.91  Intake/Output from previous day: 03/10 0701 - 03/11 0700 In: 300 [P.O.:120; I.V.:180] Out: 1750 [Urine:1750] Intake/Output this shift: No intake/output data recorded.  General appearance: alert, cooperative and no distress GI: soft, nontender. + BS, and BM.  umbilical hernia soft and easily reducible.   Lab Results:   Recent Labs  11/13/16 0209 11/14/16 0417  WBC 5.0 5.1  HGB 13.9 14.2  HCT 43.9 44.5  PLT 159 155    BMET  Recent Labs  11/13/16 0209 11/14/16 0417  NA 137 137  K 4.0 3.4*  CL 103 100*  CO2 22 29  GLUCOSE 138* 148*  BUN 12 9  CREATININE 0.73 0.68  CALCIUM 8.4* 8.8*   PT/INR  Recent Labs  11/13/16 0209 11/14/16 0417  LABPROT 26.6* 22.2*  INR 2.40 1.91     Recent Labs Lab 11/09/16 0350 11/10/16 2148 11/11/16 0356  AST 25 20 18   ALT 25 23 20   ALKPHOS 91 84 72  BILITOT 0.7 1.1 0.9  PROT 7.7 8.3* 7.0  ALBUMIN 4.0 4.0 3.6     Lipase     Component Value Date/Time   LIPASE 24 11/10/2016 2148     Studies/Results: Dg Abd Portable 1v  Result Date: 11/13/2016 CLINICAL DATA:  Small bowel obstruction. EXAM: PORTABLE ABDOMEN - 1 VIEW COMPARISON:  None. FINDINGS: There is oral contrast material in the colon. There is a nasogastric tube with the tip projecting over the stomach with the proximal port just beyond the gastroesophageal junction. There are mildly dilated loops of small bowel in the left side of the abdomen  measuring up to 4 cm. There is no evidence of pneumoperitoneum, portal venous gas or pneumatosis. There are no pathologic calcifications along the expected course of the ureters. The osseous structures are unremarkable. IMPRESSION: 1. Persistent mildly dilated loops of small bowel in the left side of the abdomen measuring up to 4 cm with oral contrast material within the colon. This may reflect a partial small bowel obstruction. No significant interval change compared with 11/11/2016. 2. nasogastric tube with the tip projecting over the stomach with the proximal port just beyond the gastroesophageal junction. Recommend advancing the nasogastric tube 10 cm. Electronically Signed   By: Kathreen Devoid   On: 11/13/2016 08:56    Medications: . carvedilol  25 mg Oral BID WC  . chlorhexidine  15 mL Mouth Rinse BID  . digoxin  0.125 mg Oral Daily  . enoxaparin (LOVENOX) injection  65 mg Subcutaneous Q24H  . insulin aspart  0-15 Units Subcutaneous TID WC  . mouth rinse  15 mL Mouth Rinse q12n4p  . pravastatin  40 mg Oral Daily   . dilTIAZem HCl-Dextrose 7.5 mg/hr (11/13/16 2304)   Assessment/Plan Incarcerated hernia with SBO SB protocol with contrast in colon 11/12/16 NICM/ AF with hx of RVR Type II diabetes Right tongue mass Body mass index is 37 FEN:  Full liquids ID: No  abx DVT:  Lovenox    Plan:  Advancing diet. Nothing new to add at this time.      LOS: 3 days    Collin [name] 11/14/2016 [phone]

## 2016-11-15 ENCOUNTER — Ambulatory Visit: Payer: Medicare HMO

## 2016-11-15 LAB — GLUCOSE, CAPILLARY
Glucose-Capillary: 116 mg/dL — ABNORMAL HIGH (ref 65–99)
Glucose-Capillary: 194 mg/dL — ABNORMAL HIGH (ref 65–99)
Glucose-Capillary: 137 mg/dL — ABNORMAL HIGH (ref 65–99)
Glucose-Capillary: 153 mg/dL — ABNORMAL HIGH (ref 65–99)

## 2016-11-15 LAB — BASIC METABOLIC PANEL
Anion gap: 9 (ref 5–15)
BUN: 11 mg/dL (ref 6–20)
CO2: 26 mmol/L (ref 22–32)
Calcium: 8.9 mg/dL (ref 8.9–10.3)
Chloride: 102 mmol/L (ref 101–111)
Creatinine, Ser: 0.71 mg/dL (ref 0.61–1.24)
GFR calc Af Amer: >60 mL/min (ref >60)
GFR calc non Af Amer: >60 mL/min (ref >60)
Glucose, Bld: 119 mg/dL — ABNORMAL HIGH (ref 65–99)
Potassium: 4.1 mmol/L (ref 3.5–5.1)
Sodium: 137 mmol/L (ref 135–145)

## 2016-11-15 LAB — CBC WITH DIFFERENTIAL/PLATELET
Basophils Absolute: 0 10*3/uL (ref 0.0–0.1)
Basophils Relative: 0 %
Eosinophils Absolute: 0.1 10*3/uL (ref 0.0–0.7)
Eosinophils Relative: 2 %
HCT: 43.9 % (ref 39.0–52.0)
Hemoglobin: 14.5 g/dL (ref 13.0–17.0)
Lymphocytes Relative: 14 %
Lymphs Abs: 0.7 10*3/uL (ref 0.7–4.0)
MCH: 29.6 pg (ref 26.0–34.0)
MCHC: 33 g/dL (ref 30.0–36.0)
MCV: 89.6 fL (ref 78.0–100.0)
Monocytes Absolute: 0.4 10*3/uL (ref 0.1–1.0)
Monocytes Relative: 8 %
Neutro Abs: 3.9 10*3/uL (ref 1.7–7.7)
Neutrophils Relative %: 76 %
Platelets: 175 10*3/uL (ref 150–400)
RBC: 4.9 MIL/uL (ref 4.22–5.81)
RDW: 14.5 % (ref 11.5–15.5)
WBC: 5.1 10*3/uL (ref 4.0–10.5)

## 2016-11-15 LAB — PROTIME-INR
INR: 1.64
Prothrombin Time: 19.6 seconds — ABNORMAL HIGH (ref 11.4–15.2)

## 2016-11-15 LAB — MAGNESIUM: Magnesium: 2.1 mg/dL (ref 1.7–2.4)

## 2016-11-15 MED ORDER — DILTIAZEM HCL 30 MG PO TABS
30.0000 mg | ORAL_TABLET | Freq: Three times a day (TID) | ORAL | Status: DC
  Administered 2016-11-15 – 2016-11-16 (×4): 30 mg via ORAL
  Filled 2016-11-15 (×5): qty 1

## 2016-11-15 MED ORDER — POLYETHYLENE GLYCOL 3350 17 G PO PACK
17.0000 g | PACK | Freq: Once | ORAL | Status: AC
  Administered 2016-11-15: 17 g via ORAL
  Filled 2016-11-15: qty 1

## 2016-11-15 MED ORDER — DEXTROSE 5 % IV SOLN
3.0000 g | INTRAVENOUS | Status: AC
  Administered 2016-11-16: 3 g via INTRAVENOUS
  Filled 2016-11-15: qty 3000

## 2016-11-15 NOTE — Care Management Note (Signed)
Case Management Note  Patient Details  Name: Collin [name] MRN: [id number] Date of Birth: [birth year]  Subjective/Objective:  Presents from home with SBO, afib with rvr , tongue mass. Was started on cardizem drip, for tongue bx on Tuesday.  NCM will cont to follow for dc needs.                  Action/Plan:   Expected Discharge Date:                  Expected Discharge Plan:  Home/Self Care  In-House Referral:     Discharge planning Services  CM Consult  Post Acute Care Choice:    Choice offered to:     DME Arranged:    DME Agency:     HH Arranged:    HH Agency:     Status of Service:  In process, will continue to follow  If discussed at Long Length of Stay Meetings, dates discussed:    Additional Comments:  Zenon Mayo, RN 11/15/2016, 9:04 AM

## 2016-11-15 NOTE — Care Management Important Message (Signed)
Important Message  Patient Details  Name: Collin [name] MRN: [id number] Date of Birth: 08-12-1941   Medicare Important Message Given:  Yes    Nathen May 11/15/2016, 1:46 PM

## 2016-11-15 NOTE — Progress Notes (Signed)
Physical Therapy Treatment Patient Details Name: Collin [name] MRN: [id number] DOB: 15-Jun-1941 Today's Date: 11/15/2016    History of Present Illness Patient is a 76 yo male admitted 11/10/16 with N/V.  Patient with incarcerated hernia with SBO.  In ED developed Afib with RVR.     PMH:  Afib, DM, obesity, NICM, HLD, OSA, ICD, Rt tongue mass scheduled for surgery Tues 11/16/16.    PT Comments    Pt ambulated 400' with RW and supervision. HR fluctuated 113-125 bpm. Pt unsteady without UE support, continue to encourage him to use RW. PT will continue to follow.     Follow Up Recommendations  No PT follow up;Supervision for mobility/OOB     Equipment Recommendations  Rolling walker with 5" wheels    Recommendations for Other Services       Precautions / Restrictions Precautions Precautions: Other (comment) Precaution Comments: watch HR Restrictions Weight Bearing Restrictions: No    Mobility  Bed Mobility               General bed mobility comments: pt inchair  Transfers Overall transfer level: Modified independent Equipment used: Rolling walker (2 wheeled) Transfers: Sit to/from Stand Sit to Stand: Modified independent (Device/Increase time)         General transfer comment: pt stood and sat safely from chair with proper hand placement  Ambulation/Gait Ambulation/Gait assistance: Supervision Ambulation Distance (Feet): 400 Feet Assistive device: Rolling walker (2 wheeled) Gait Pattern/deviations: Step-through pattern;Decreased stride length;Trunk flexed;Wide base of support Gait velocity: decreased Gait velocity interpretation: <1.8 ft/sec, indicative of risk for recurrent falls General Gait Details: left ankle deformity noted with left foot eversion and plantar flexion, pt reports he wears supportive shoes at home, discussed possibility of extra support with orthotics. HR 113-125 bpm during ambulation. Able to converse throughout.    Stairs             Wheelchair Mobility    Modified Rankin (Stroke Patients Only)       Balance Overall balance assessment: Needs assistance Sitting-balance support: No upper extremity supported Sitting balance-Leahy Scale: Good     Standing balance support: No upper extremity supported;During functional activity Standing balance-Leahy Scale: Fair Standing balance comment: able to maintain static standing without support, but unsafe with dynamic activity                    Cognition Arousal/Alertness: Awake/alert Behavior During Therapy: WFL for tasks assessed/performed Overall Cognitive Status: Within Functional Limits for tasks assessed                      Exercises      General Comments        Pertinent Vitals/Pain Pain Assessment: No/denies pain    Home Living                      Prior Function            PT Goals (current goals can now be found in the care plan section) Acute Rehab PT Goals Patient Stated Goal: "To get through all this surgery" PT Goal Formulation: With patient/family Time For Goal Achievement: 11/21/16 Potential to Achieve Goals: Good Progress towards PT goals: Progressing toward goals    Frequency    Min 3X/week      PT Plan Current plan remains appropriate    Co-evaluation             End of Session Equipment Utilized During Treatment:  Gait belt Activity Tolerance: Patient tolerated treatment well Patient left: in chair;with call bell/phone within reach Nurse Communication: Mobility status PT Visit Diagnosis: Unsteadiness on feet (R26.81);Other abnormalities of gait and mobility (R26.89);Muscle weakness (generalized) (M62.81)     Time: 2542-7062 PT Time Calculation (min) (ACUTE ONLY): 28 min  Charges:  $Gait Training: 23-37 mins                    G Codes:     Lyanne Co, PT  Acute Rehab Services  [phone]   Garrison L Nykira Reddix 11/15/2016, 10:04 AM

## 2016-11-15 NOTE — Progress Notes (Signed)
PROGRESS NOTE    Collin [name]  WUJ:[id number] DOB: Nov 22, 1940 DOA: 11/10/2016 PCP: [name], MD    Brief Narrative:   Collin [name] is a 76 y.o. male with atrial fibrillation diabetes mellitus type 2, nonischemic cardiomyopathy and hyperlipidemia presents to the ER with complaints of persistent nausea vomiting. Patient has had a neck CT yesterday and was told to have good fluid intake. Following which patient started developing multiple episodes of vomiting. Last bowel movement was 3 days ago. Denies any abdominal pain. Patient did come with abdominal pain to the ER 3 days ago and at that time CT of the abdomen did not show any obstruction. Patient was originally scheduled for surgery of the neck mass by Dr. Ezzard Standing on Friday, March 9.  ED Course: Acute abdominal series shows possible small bowel obstruction. Patient had NG tube placed and had 1700 mL of fluid suctioned. On-call general surgeon Dr. Lindie Spruce was consulted. Patient is being admitted for small bowel obstruction. Patient started developing A. fib with RVR while waiting in the ER.  Patient admitted NG tube placed patient placed on bowel rest and supportive care. Patient improved with conservative treatment area NG tube was discontinued. Diet was advanced and patient currently on a soft diet. Patient was on IV Cardizem and has been transitioned to oral Coreg and digoxin which was his home regimen. Diltiazem 30 mg every 6 hours has been added for better rate control in the perioperative period as patient in preparation for tongue mass biopsy. ENT on Tuesday, 11/16/2016.   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 Patient noted to have contrast  throughout the colon. Moderate gaseous distention of the small bowel per abdominal films of 11/11/2016. Repeat abdominal films on 11/13/2016 with persistent mildly dilated loops of bowel in the left side of the abdomen measuring up to 4 cm with oral contrast material within the colon. This may reflect partial small bowel obstruction. No significant change compared to 11/11/2016. Patient passing flatus. Patient states had bowel movements yesterday. Abdomen is soft and nontender to palpation. No further nausea or emesis. NG tube has been discontinued per general surgery. Patient tolerating full liquids and diet has been advanced to a soft diet. Replete electrolytes. Mobilize. Per general surgery.  #2 A. fib with RVR/NICM Patient currently on Cardizem drip. Patient in A. fib with RVR likely secondary to problem #1. Chest x-ray negative for any acute infiltrates. INR is now subtherapeutic in anticipation of surgery/biopsy of right base tongue mass tomorrow 11/16/2016. INR now 1.64.Marland Kitchen Heart rate improving however in the low 100s to the 130s per PT on ambulation. Continue home regimen of Coreg and digitoxin. Will add Cardizem 30 mg by mouth every 8 hours preoperatively and perioperatively. Coumadin on hold in anticipation of tongue biopsy to possibly be done on Tuesday 11/16/2016. Continue to hold anticoagulation. Follow.  #3 diabetes mellitus type 2 Hemoglobin A1c was 6.9 on 11/13/2015. Repeat hemoglobin A1c = 6.7. Hold oral hypoglycemic agents. Continue sliding scale insulin. Follow.  #4 hyperlipidemia Statin on hold. Resume once patient tolerating oral intake and small bowel obstruction has resolved.  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 Patient was supposed to have a biopsy done and elective removal of shoulder lipoma per Dr.Newman, ENT on 11/12/2016, however this will need to be postponed secondary to acute  illness and hospitalization. Will continue to hold patient's anticoagulation. INR currently at 1.64. Case was discussed with Dr. Ezzard Standing on 11/12/2016, and if patient continues to improve and small bowel obstruction has resolved Dr. Ezzard Standing planning on biopsy of tongue mass on Tuesday, 11/16/2016. Per ENT. Will place on incentive spirometry to optimize pulmonary function.  #6 pressure injury of skin Frequent turns.    DVT prophylaxis: Lovenox. Code Status: Full Family Communication: Updated patient. No family at bedside. Disposition Plan: Remain the step down unit today. Hopefully home when small bowel obstruction has resolved and tolerating oral intake and A. fib with RVR controlled and postop.   Consultants:   General surgery: Dr. Lindie Spruce 11/11/2016  ENT via telephone Dr. Ezzard Standing 11/11/2016  Procedures:   CT soft tissue neck 11/10/2016  Acute abdominal series 11/10/2016  Chest x-ray 11/11/2016  Abdominal films 11/11/2016, 11/13/2016  Antimicrobials:   None   Subjective: Patient states he feels well. Denies any shortness of breath. No chest pain. No abdominal pain. No nausea. No vomiting. NG tube has been removed. Patient tolerating full liquids. Patient states he's passing flatus. Patient states had BM yesterday that was a small ball.   Objective: Vitals:   11/14/16 1655 11/14/16 2239 11/15/16 0405 11/15/16 0727  BP: 138/89 127/76 124/90 120/79  Pulse: 77 82 (!) 110 (!) 105  Resp: 19 (!) 24 20 18   Temp: 98 F (36.7 C) 98.3 F (36.8 C) 97.8 F (36.6 C) 98 F (36.7 C)  TempSrc: Oral Oral Oral Oral  SpO2: 93% 94% 95% 91%  Weight:      Height:        Intake/Output Summary (Last 24 hours) at 11/15/16 0915 Last data filed at 11/15/16 0826  Gross per 24 hour  Intake            95.63 ml  Output             1550 ml  Net         -1454.37 ml   Filed Weights   11/10/16 2142 11/11/16 1300  Weight: 131.5 kg (290 lb) 128.2 kg (282 lb 10.1 oz)    Examination:  General  exam: Appears calm and comfortable  Respiratory system: Clear to auscultation. Respiratory effort normal. Cardiovascular system: Irregularly irregular. No JVD, murmurs, rubs, gallops or clicks. Trace-1+ BLE edema. Gastrointestinal system: Abdomen is obese, less distended, softer and nontender. No organomegaly or masses felt. Normal bowel sounds heard. Central nervous system: Alert and oriented. No focal neurological deficits. Extremities: Symmetric 5 x 5 power. Skin: No rashes, lesions or ulcers Psychiatry: Judgement and insight appear normal. Mood & affect appropriate.     Data Reviewed: I have personally reviewed following labs and imaging studies  CBC:  Recent Labs Lab 11/09/16 0350 11/10/16 2148 11/11/16 0356 11/12/16 0326 11/13/16 0209 11/14/16 0417 11/15/16 0304  WBC 5.5 3.5* 2.5* 3.9* 5.0 5.1 5.1  NEUTROABS 4.6 2.6 1.5*  --   --   --  3.9  HGB 14.4 15.6 14.2 13.2 13.9 14.2 14.5  HCT 44.0 48.0 43.9 42.0 43.9 44.5 43.9  MCV 90.5 90.2 90.7 92.1 91.5 90.8 89.6  PLT 161 177 164 153 159 155 175   Basic Metabolic Panel:  Recent Labs Lab 11/11/16 0356 11/12/16 0326 11/13/16 0209 11/14/16 0417 11/15/16 0304  NA  140 141 137 137 137  K 3.7 3.6 4.0 3.4* 4.1  CL 105 106 103 100* 102  CO2 25 28 22 29 26   GLUCOSE 134* 136* 138* 148* 119*  BUN 22* 14 12 9 11   CREATININE 0.88 0.82 0.73 0.68 0.71  CALCIUM 8.0* 8.3* 8.4* 8.8* 8.9  MG  --  1.8 2.1 2.1 2.1   GFR: Estimated Creatinine Clearance: 111.9 mL/min (by C-G formula based on SCr of 0.71 mg/dL). Liver Function Tests:  Recent Labs Lab 11/09/16 0350 11/10/16 2148 11/11/16 0356  AST 25 20 18   ALT 25 23 20   ALKPHOS 91 84 72  BILITOT 0.7 1.1 0.9  PROT 7.7 8.3* 7.0  ALBUMIN 4.0 4.0 3.6    Recent Labs Lab 11/10/16 2148  LIPASE 24   No results for input(s): AMMONIA in the last 168 hours. Coagulation Profile:  Recent Labs Lab 11/11/16 0902 11/12/16 0326 11/13/16 0209 11/14/16 0417 11/15/16 0304  INR  3.00 2.69 2.40 1.91 1.64   Cardiac Enzymes: No results for input(s): CKTOTAL, CKMB, CKMBINDEX, TROPONINI in the last 168 hours. BNP (last 3 results)  Recent Labs  09/16/16 1444  PROBNP 1,110*   HbA1C:  Recent Labs  11/12/16 1006  HGBA1C 6.7*   CBG:  Recent Labs Lab 11/14/16 0759 11/14/16 1241 11/14/16 1654 11/14/16 2104 11/15/16 0734  GLUCAP 141* 124* 102* 152* 116*   Lipid Profile: No results for input(s): CHOL, HDL, LDLCALC, TRIG, CHOLHDL, LDLDIRECT in the last 72 hours. Thyroid Function Tests: No results for input(s): TSH, T4TOTAL, FREET4, T3FREE, THYROIDAB in the last 72 hours. Anemia Panel: No results for input(s): VITAMINB12, FOLATE, FERRITIN, TIBC, IRON, RETICCTPCT in the last 72 hours. Sepsis Labs:  Recent Labs Lab 11/09/16 0404 11/10/16 2335 11/11/16 0252  LATICACIDVEN 1.34 1.27 1.12    Recent Results (from the past 240 hour(s))  MRSA PCR Screening     Status: None   Collection Time: 11/11/16  1:43 PM  Result Value Ref Range Status   MRSA by PCR NEGATIVE NEGATIVE Final    Comment:        The GeneXpert MRSA Assay (FDA approved for NASAL specimens only), is one component of a comprehensive MRSA colonization surveillance program. It is not intended to diagnose MRSA infection nor to guide or monitor treatment for MRSA infections.          Radiology Studies: No results found.      Scheduled Meds: . carvedilol  25 mg Oral BID WC  . chlorhexidine  15 mL Mouth Rinse BID  . digoxin  0.125 mg Oral Daily  . diltiazem  30 mg Oral Q8H  . enoxaparin (LOVENOX) injection  65 mg Subcutaneous Q24H  . insulin aspart  0-15 Units Subcutaneous TID WC  . mouth rinse  15 mL Mouth Rinse q12n4p  . pravastatin  40 mg Oral Daily   Continuous Infusions:    LOS: 4 days    Time spent: 40 mins    THOMPSON,DANIEL, MD [facility] Pager [phone]  If 7PM-7AM, please contact night-coverage [email or web address] Password [email or web address] 11/15/2016, 9:15  AM

## 2016-11-15 NOTE — Progress Notes (Signed)
Occupational Therapy Evaluation Patient Details Name: Collin [name] MRN: [id number] DOB: 04-Jun-1941 Today's Date: 11/15/2016    History of Present Illness Patient is a 76 yo male admitted 11/10/16 with N/V.  Patient with incarcerated hernia with SBO.  In ED developed Afib with RVR.     PMH:  Afib, DM, obesity, NICM, HLD, OSA, ICD, Rt tongue mass scheduled for surgery Tues 11/16/16.   Clinical Impression   PTA, pt independent with ADL and mobility. Enjoyed doing Presenter, broadcasting. VSS throughout session. Pt close to baseline level of functioning with ADL. Discussed recommendation of using a showerchair to reduce risk of falls. Pt verbalized understanding. Will follow up after surgery and make any further recommendations to facilitate safe DC home.     Follow Up Recommendations  No OT follow up;Supervision - Intermittent    Equipment Recommendations  Tub/shower seat    Recommendations for Other Services       Precautions / Restrictions Precautions Precaution Comments: watch HR      Mobility Bed Mobility                  Transfers Overall transfer level: Modified independent Equipment used: Rolling walker (2 wheeled) Transfers: Sit to/from Stand Sit to Stand: Modified independent (Device/Increase time)         General transfer comment: good safety awareness during mobility    Balance     Sitting balance-Leahy Scale: Good       Standing balance-Leahy Scale: Fair                              ADL Overall ADL's : Needs assistance/impaired     Grooming: Modified independent   Upper Body Bathing: Set up;Sitting   Lower Body Bathing: Set up;Sit to/from stand   Upper Body Dressing : Set up;Sitting   Lower Body Dressing: Set up;Sit to/from stand   Toilet Transfer: Supervision/safety;Ambulation;RW   Toileting- Clothing Manipulation and Hygiene: Modified independent       Functional mobility during ADLs: Supervision/safety;Rolling walker General ADL  Comments: Pt close to baseline regarding his ADL. Discussed recommendation of using a showerseat in tub to reduce risk of falls. Pt verbalized understanding.      Vision Baseline Vision/History: Wears glasses       Perception     Praxis      Pertinent Vitals/Pain Pain Assessment: No/denies pain     Hand Dominance Right   Extremity/Trunk Assessment Upper Extremity Assessment Upper Extremity Assessment: Overall WFL for tasks assessed   Lower Extremity Assessment Lower Extremity Assessment: Defer to PT evaluation   Cervical / Trunk Assessment Cervical / Trunk Assessment: Normal   Communication Communication Communication: No difficulties   Cognition Arousal/Alertness: Awake/alert Behavior During Therapy: WFL for tasks assessed/performed Overall Cognitive Status: Within Functional Limits for tasks assessed                     General Comments       Exercises       Shoulder Instructions      Home Living Family/patient expects to be discharged to:: Private residence Living Arrangements: Spouse/significant other Available Help at Discharge: Family;Available 24 hours/day Type of Home: House Home Access: Stairs to enter [employer] of Steps: 4 Entrance Stairs-Rails: Right;Left Home Layout: Multi-level;Laundry or work area in basement;Bed/bath upstairs Alternate Teacher, music of Steps: 7 Alternate Level Stairs-Rails: Right;Left;Can reach both Bathroom Shower/Tub: Tub/shower unit;Walk-in shower Shower/tub characteristics: Engineer, building services: Standard  Bathroom Accessibility: Yes How Accessible: Accessible via walker Home Equipment: Cane - single point;Grab bars - tub/shower          Prior Functioning/Environment Level of Independence: Independent;Needs assistance  Gait / Transfers Assistance Needed: Ambulates independently with no assistive device ADL's / Homemaking Assistance Needed: Independent with bathing, dressing.  Wife  prepares meals and does housekeeping.   Comments: Pt drives        OT Problem List: Decreased activity tolerance;Decreased knowledge of use of DME or AE;Obesity      OT Treatment/Interventions: Self-care/ADL training;Energy conservation;DME and/or AE instruction;Patient/family education;Therapeutic activities    OT Goals(Current goals can be found in the care plan section) Acute Rehab OT Goals Patient Stated Goal: "To get through all this surgery" OT Goal Formulation: With patient Time For Goal Achievement: 11/29/16 Potential to Achieve Goals: Good  OT Frequency: Min 2X/week   Barriers to D/C:            Co-evaluation              End of Session Equipment Utilized During Treatment: Gait belt;Rolling walker Nurse Communication: Mobility status  Activity Tolerance: Patient tolerated treatment well Patient left: in chair;with call bell/phone within reach  OT Visit Diagnosis: Muscle weakness (generalized) (M62.81)                ADL either performed or assessed with clinical judgement  Time: 1211-1236 OT Time Calculation (min): 25 min Charges:  OT General Charges $OT Visit: 1 Procedure OT Evaluation $OT Eval Low Complexity: 1 Procedure OT Treatments $Self Care/Home Management : 8-22 mins G-Codes:     [facility], OT/L  [phone] 11/15/2016  [place]ake[place] Perris,HILLARY 11/15/2016, 2:10 PM

## 2016-11-15 NOTE — Progress Notes (Signed)
  Subjective: tol diet, having bms/flatus, for ent procedure in am  Objective: Vital signs in last 24 hours: Temp:  [97.8 F (36.6 C)-98.7 F (37.1 C)] 98.7 F (37.1 C) (03/12 1133) Pulse Rate:  [77-110] 94 (03/12 1133) Resp:  [18-24] 23 (03/12 1133) BP: (96-138)/(74-90) 96/74 (03/12 1133) SpO2:  [91 %-95 %] 91 % (03/12 1133) Last BM Date: 11/15/16  Intake/Output from previous day: 03/11 0701 - 03/12 0700 In: 95.6 [I.V.:95.6] Out: 1300 [Urine:1300] Intake/Output this shift: Total I/O In: -  Out: 250 [Urine:250]  GI: soft reducible umbo hernia bs present  Lab Results:   Recent Labs  11/14/16 0417 11/15/16 0304  WBC 5.1 5.1  HGB 14.2 14.5  HCT 44.5 43.9  PLT 155 175   BMET  Recent Labs  11/14/16 0417 11/15/16 0304  NA 137 137  K 3.4* 4.1  CL 100* 102  CO2 29 26  GLUCOSE 148* 119*  BUN 9 11  CREATININE 0.68 0.71  CALCIUM 8.8* 8.9   PT/INR  Recent Labs  11/14/16 0417 11/15/16 0304  LABPROT 22.2* 19.6*  INR 1.91 1.64   ABG No results for input(s): PHART, HCO3 in the last 72 hours.  Invalid input(s): PCO2, PO2  Studies/Results: No results found.  Anti-infectives: Anti-infectives    Start     Dose/Rate Route Frequency Ordered Stop   11/16/16 0800  ceFAZolin (ANCEF) 3 g in dextrose 5 % 50 mL IVPB     3 g 130 mL/hr over 30 Minutes Intravenous To ShortStay Surgical 11/15/16 1122 11/17/16 0800      Assessment/Plan: Sbo, uh Tongue mass  No indication for surgery on UH now, can return to our office to consider repair once figure out plan for tongue mass. Understands at some risk of incarceration.   [facility] 11/15/2016

## 2016-11-16 ENCOUNTER — Encounter (HOSPITAL_COMMUNITY): Payer: Self-pay | Admitting: Anesthesiology

## 2016-11-16 ENCOUNTER — Encounter (HOSPITAL_COMMUNITY)
Admission: EM | Disposition: A | Discharge status: Home / Self Care | Payer: Self-pay | Source: Home / Self Care | Attending: Internal Medicine

## 2016-11-16 ENCOUNTER — Inpatient Hospital Stay (HOSPITAL_COMMUNITY): Payer: Medicare HMO | Admitting: Anesthesiology

## 2016-11-16 DIAGNOSIS — K429 Umbilical hernia without obstruction or gangrene: Secondary | ICD-10-CM

## 2016-11-16 HISTORY — PX: DIRECT LARYNGOSCOPY: SHX5326

## 2016-11-16 HISTORY — PX: MASS EXCISION: SHX2000

## 2016-11-16 LAB — CBC WITH DIFFERENTIAL/PLATELET
Basophils Absolute: 0 10*3/uL (ref 0.0–0.1)
Basophils Relative: 0 %
Eosinophils Absolute: 0.1 10*3/uL (ref 0.0–0.7)
Eosinophils Relative: 3 %
HCT: 42.8 % (ref 39.0–52.0)
Hemoglobin: 13.8 g/dL (ref 13.0–17.0)
Lymphocytes Relative: 23 %
Lymphs Abs: 0.9 10*3/uL (ref 0.7–4.0)
MCH: 28.9 pg (ref 26.0–34.0)
MCHC: 32.2 g/dL (ref 30.0–36.0)
MCV: 89.7 fL (ref 78.0–100.0)
Monocytes Absolute: 0.3 10*3/uL (ref 0.1–1.0)
Monocytes Relative: 8 %
Neutro Abs: 2.8 10*3/uL (ref 1.7–7.7)
Neutrophils Relative %: 66 %
Platelets: 170 10*3/uL (ref 150–400)
RBC: 4.77 MIL/uL (ref 4.22–5.81)
RDW: 14.2 % (ref 11.5–15.5)
WBC: 4.1 10*3/uL (ref 4.0–10.5)

## 2016-11-16 LAB — BASIC METABOLIC PANEL
Anion gap: 10 (ref 5–15)
BUN: 16 mg/dL (ref 6–20)
CO2: 27 mmol/L (ref 22–32)
Calcium: 9 mg/dL (ref 8.9–10.3)
Chloride: 101 mmol/L (ref 101–111)
Creatinine, Ser: 0.8 mg/dL (ref 0.61–1.24)
GFR calc Af Amer: >60 mL/min (ref >60)
GFR calc non Af Amer: >60 mL/min (ref >60)
Glucose, Bld: 115 mg/dL — ABNORMAL HIGH (ref 65–99)
Potassium: 3.9 mmol/L (ref 3.5–5.1)
Sodium: 138 mmol/L (ref 135–145)

## 2016-11-16 LAB — GLUCOSE, CAPILLARY
Glucose-Capillary: 113 mg/dL — ABNORMAL HIGH (ref 65–99)
Glucose-Capillary: 136 mg/dL — ABNORMAL HIGH (ref 65–99)
Glucose-Capillary: 149 mg/dL — ABNORMAL HIGH (ref 65–99)
Glucose-Capillary: 201 mg/dL — ABNORMAL HIGH (ref 65–99)
Glucose-Capillary: 203 mg/dL — ABNORMAL HIGH (ref 65–99)

## 2016-11-16 LAB — PROTIME-INR
INR: 1.38
Prothrombin Time: 17 seconds — ABNORMAL HIGH (ref 11.4–15.2)

## 2016-11-16 SURGERY — LARYNGOSCOPY, DIRECT
Anesthesia: General | Laterality: Right

## 2016-11-16 MED ORDER — CHLORHEXIDINE GLUCONATE 4 % EX LIQD
60.0000 mL | Freq: Once | CUTANEOUS | Status: DC
  Filled 2016-11-16: qty 60

## 2016-11-16 MED ORDER — DEXAMETHASONE SODIUM PHOSPHATE 10 MG/ML IJ SOLN
INTRAMUSCULAR | Status: DC | PRN
  Administered 2016-11-16: 10 mg via INTRAVENOUS

## 2016-11-16 MED ORDER — FENTANYL CITRATE (PF) 100 MCG/2ML IJ SOLN
INTRAMUSCULAR | Status: DC | PRN
Start: 2016-11-16 — End: 2016-11-16
  Administered 2016-11-16: 50 ug via INTRAVENOUS
  Administered 2016-11-16: 150 ug via INTRAVENOUS
  Administered 2016-11-16: 50 ug via INTRAVENOUS

## 2016-11-16 MED ORDER — LIDOCAINE-EPINEPHRINE (PF) 1 %-1:200000 IJ SOLN
INTRAMUSCULAR | Status: DC | PRN
  Administered 2016-11-16: 8 mL

## 2016-11-16 MED ORDER — BACITRACIN ZINC 500 UNIT/GM EX OINT
TOPICAL_OINTMENT | CUTANEOUS | Status: DC | PRN
  Administered 2016-11-16: 1 via TOPICAL

## 2016-11-16 MED ORDER — DILTIAZEM HCL 60 MG PO TABS
90.0000 mg | ORAL_TABLET | Freq: Every day | ORAL | Status: DC
  Administered 2016-11-17: 90 mg via ORAL
  Filled 2016-11-16: qty 1

## 2016-11-16 MED ORDER — SUGAMMADEX SODIUM 200 MG/2ML IV SOLN
INTRAVENOUS | Status: AC
  Filled 2016-11-16: qty 2

## 2016-11-16 MED ORDER — MEPERIDINE HCL 25 MG/ML IJ SOLN: 6.2500 mg | INTRAMUSCULAR | Status: DC | PRN

## 2016-11-16 MED ORDER — FUROSEMIDE 40 MG PO TABS
20.0000 mg | ORAL_TABLET | Freq: Every day | ORAL | Status: DC
Start: 2016-11-17 — End: 2016-11-17
  Administered 2016-11-17: 20 mg via ORAL
  Filled 2016-11-16: qty 1

## 2016-11-16 MED ORDER — BACITRACIN ZINC 500 UNIT/GM EX OINT
TOPICAL_OINTMENT | CUTANEOUS | Status: AC
  Filled 2016-11-16: qty 28.35

## 2016-11-16 MED ORDER — ONDANSETRON HCL 4 MG/2ML IJ SOLN: 4.0000 mg | Freq: Once | INTRAMUSCULAR | Status: DC | PRN

## 2016-11-16 MED ORDER — 0.9 % SODIUM CHLORIDE (POUR BTL) OPTIME
TOPICAL | Status: DC | PRN
  Administered 2016-11-16: 1000 mL

## 2016-11-16 MED ORDER — WARFARIN - PHARMACIST DOSING INPATIENT: Freq: Every day | Status: DC

## 2016-11-16 MED ORDER — CHLORHEXIDINE GLUCONATE 4 % EX LIQD
60.0000 mL | Freq: Once | CUTANEOUS | Status: AC
  Filled 2016-11-16: qty 60

## 2016-11-16 MED ORDER — OXYMETAZOLINE HCL 0.05 % NA SOLN
NASAL | Status: AC
  Filled 2016-11-16: qty 15

## 2016-11-16 MED ORDER — ROCURONIUM BROMIDE 100 MG/10ML IV SOLN
INTRAVENOUS | Status: DC | PRN
  Administered 2016-11-16: 30 mg via INTRAVENOUS

## 2016-11-16 MED ORDER — LACTATED RINGERS IV SOLN
INTRAVENOUS | Status: DC | PRN
  Administered 2016-11-16: 09:00:00 via INTRAVENOUS

## 2016-11-16 MED ORDER — CEFAZOLIN SODIUM-DEXTROSE 2-4 GM/100ML-% IV SOLN
2.0000 g | Freq: Three times a day (TID) | INTRAVENOUS | Status: DC
  Administered 2016-11-16 – 2016-11-17 (×3): 2 g via INTRAVENOUS
  Filled 2016-11-16 (×4): qty 100

## 2016-11-16 MED ORDER — LIDOCAINE HCL (CARDIAC) 20 MG/ML IV SOLN
INTRAVENOUS | Status: DC | PRN
  Administered 2016-11-16: 100 mg via INTRAVENOUS

## 2016-11-16 MED ORDER — MIDAZOLAM HCL 2 MG/2ML IJ SOLN
INTRAMUSCULAR | Status: AC
  Filled 2016-11-16: qty 2

## 2016-11-16 MED ORDER — ARTIFICIAL TEARS OP OINT
TOPICAL_OINTMENT | OPHTHALMIC | Status: DC | PRN
  Administered 2016-11-16: 1 via OPHTHALMIC

## 2016-11-16 MED ORDER — SUGAMMADEX SODIUM 200 MG/2ML IV SOLN
INTRAVENOUS | Status: DC | PRN
  Administered 2016-11-16: 200 mg via INTRAVENOUS

## 2016-11-16 MED ORDER — DILTIAZEM HCL ER COATED BEADS 120 MG PO CP24: 120.0000 mg | ORAL_CAPSULE | Freq: Every day | ORAL | Status: DC

## 2016-11-16 MED ORDER — PROPOFOL 10 MG/ML IV BOLUS
INTRAVENOUS | Status: AC
  Filled 2016-11-16: qty 20

## 2016-11-16 MED ORDER — SUCCINYLCHOLINE CHLORIDE 20 MG/ML IJ SOLN
INTRAMUSCULAR | Status: DC | PRN
  Administered 2016-11-16: 140 mg via INTRAVENOUS

## 2016-11-16 MED ORDER — EPINEPHRINE HCL (NASAL) 0.1 % NA SOLN
NASAL | Status: AC
  Filled 2016-11-16: qty 30

## 2016-11-16 MED ORDER — WARFARIN SODIUM 5 MG PO TABS
7.5000 mg | ORAL_TABLET | Freq: Once | ORAL | Status: AC
  Administered 2016-11-16: 7.5 mg via ORAL
  Filled 2016-11-16: qty 2

## 2016-11-16 MED ORDER — LIDOCAINE-EPINEPHRINE (PF) 1 %-1:200000 IJ SOLN
INTRAMUSCULAR | Status: AC
  Filled 2016-11-16: qty 30

## 2016-11-16 MED ORDER — ONDANSETRON HCL 4 MG/2ML IJ SOLN
INTRAMUSCULAR | Status: DC | PRN
  Administered 2016-11-16: 4 mg via INTRAVENOUS

## 2016-11-16 MED ORDER — MIDAZOLAM HCL 5 MG/5ML IJ SOLN
INTRAMUSCULAR | Status: DC | PRN
  Administered 2016-11-16: 2 mg via INTRAVENOUS

## 2016-11-16 MED ORDER — FUROSEMIDE 10 MG/ML IJ SOLN
40.0000 mg | Freq: Once | INTRAMUSCULAR | Status: AC
  Administered 2016-11-16: 40 mg via INTRAVENOUS
  Filled 2016-11-16: qty 4

## 2016-11-16 MED ORDER — DEXTROSE 5 % IV SOLN
INTRAVENOUS | Status: DC | PRN
  Administered 2016-11-16: 20 ug/min via INTRAVENOUS

## 2016-11-16 MED ORDER — FENTANYL CITRATE (PF) 250 MCG/5ML IJ SOLN
INTRAMUSCULAR | Status: AC
  Filled 2016-11-16: qty 5

## 2016-11-16 MED ORDER — DEXAMETHASONE SODIUM PHOSPHATE 10 MG/ML IJ SOLN
INTRAMUSCULAR | Status: AC
  Filled 2016-11-16: qty 1

## 2016-11-16 MED ORDER — ONDANSETRON HCL 4 MG/2ML IJ SOLN
INTRAMUSCULAR | Status: AC
  Filled 2016-11-16: qty 2

## 2016-11-16 MED ORDER — PROPOFOL 10 MG/ML IV BOLUS
INTRAVENOUS | Status: DC | PRN
  Administered 2016-11-16: 110 mg via INTRAVENOUS

## 2016-11-16 MED ORDER — HYDROMORPHONE HCL 1 MG/ML IJ SOLN: 0.2500 mg | INTRAMUSCULAR | Status: DC | PRN

## 2016-11-16 SURGICAL SUPPLY — 68 items
AIRSTRIP 4 3/4X3 1/4 7185 (GAUZE/BANDAGES/DRESSINGS) IMPLANT
ATTRACTOMAT 16X20 MAGNETIC DRP (DRAPES) IMPLANT
BALLN PULM 15 16.5 18X75 (BALLOONS)
BALLOON PULM 15 16.5 18X75 (BALLOONS) IMPLANT
BLADE 10 SAFETY STRL DISP (BLADE) ×3 IMPLANT
BLADE SURG 10 STRL SS (BLADE) ×3 IMPLANT
BLADE SURG 15 STRL LF DISP TIS (BLADE) IMPLANT
BLADE SURG 15 STRL SS (BLADE)
BNDG CONFORM 2 STRL LF (GAUZE/BANDAGES/DRESSINGS) IMPLANT
BNDG GAUZE ELAST 4 BULKY (GAUZE/BANDAGES/DRESSINGS) IMPLANT
CANISTER SUCT 3000ML PPV (MISCELLANEOUS) ×3 IMPLANT
CATH ROBINSON RED A/P 16FR (CATHETERS) IMPLANT
CLEANER TIP ELECTROSURG 2X2 (MISCELLANEOUS) ×3 IMPLANT
CONT SPEC 4OZ CLIKSEAL STRL BL (MISCELLANEOUS) ×3 IMPLANT
COVER BACK TABLE 60X90IN (DRAPES) ×6 IMPLANT
COVER MAYO STAND STRL (DRAPES) ×3 IMPLANT
COVER SURGICAL LIGHT HANDLE (MISCELLANEOUS) ×3 IMPLANT
CRADLE DONUT ADULT HEAD (MISCELLANEOUS) IMPLANT
DERMABOND ADVANCED (GAUZE/BANDAGES/DRESSINGS) ×1
DERMABOND ADVANCED .7 DNX12 (GAUZE/BANDAGES/DRESSINGS) ×2 IMPLANT
DRAIN PENROSE 1/4X12 LTX STRL (WOUND CARE) IMPLANT
DRAIN SNY 10 ROU (WOUND CARE) IMPLANT
DRAIN SNY 7 FPER (WOUND CARE) IMPLANT
DRAPE PROXIMA HALF (DRAPES) ×3 IMPLANT
DRSG EMULSION OIL 3X3 NADH (GAUZE/BANDAGES/DRESSINGS) IMPLANT
ELECT COATED BLADE 2.86 ST (ELECTRODE) ×6 IMPLANT
ELECT NEEDLE TIP 2.8 STRL (NEEDLE) ×3 IMPLANT
ELECT REM PT RETURN 9FT ADLT (ELECTROSURGICAL) ×3
ELECTRODE REM PT RTRN 9FT ADLT (ELECTROSURGICAL) ×2 IMPLANT
GAUZE SPONGE 4X4 12PLY STRL (GAUZE/BANDAGES/DRESSINGS) ×3 IMPLANT
GAUZE SPONGE 4X4 16PLY XRAY LF (GAUZE/BANDAGES/DRESSINGS) ×3 IMPLANT
GLOVE ECLIPSE 7.5 STRL STRAW (GLOVE) ×3 IMPLANT
GLOVE SS BIOGEL STRL SZ 7.5 (GLOVE) ×2 IMPLANT
GLOVE SUPERSENSE BIOGEL SZ 7.5 (GLOVE) ×1
GOWN STRL REUS W/ TWL LRG LVL3 (GOWN DISPOSABLE) ×4 IMPLANT
GOWN STRL REUS W/ TWL XL LVL3 (GOWN DISPOSABLE) ×2 IMPLANT
GOWN STRL REUS W/TWL LRG LVL3 (GOWN DISPOSABLE) ×2
GOWN STRL REUS W/TWL XL LVL3 (GOWN DISPOSABLE) ×1
GUARD TEETH (MISCELLANEOUS) IMPLANT
KIT BASIN OR (CUSTOM PROCEDURE TRAY) ×3 IMPLANT
KIT ROOM TURNOVER OR (KITS) ×3 IMPLANT
NEEDLE 25GX 5/8IN NON SAFETY (NEEDLE) IMPLANT
NEEDLE HYPO 25GX1X1/2 BEV (NEEDLE) IMPLANT
NS IRRIG 1000ML POUR BTL (IV SOLUTION) ×3 IMPLANT
PAD ARMBOARD 7.5X6 YLW CONV (MISCELLANEOUS) ×6 IMPLANT
PATTIES SURGICAL .5 X3 (DISPOSABLE) IMPLANT
PENCIL FOOT CONTROL (ELECTRODE) ×3 IMPLANT
POUCH STERILIZING 3 X22 (STERILIZATION PRODUCTS) IMPLANT
SOLUTION ANTI FOG 6CC (MISCELLANEOUS) IMPLANT
SPECIMEN JAR SMALL (MISCELLANEOUS) ×3 IMPLANT
SURGILUBE 2OZ TUBE FLIPTOP (MISCELLANEOUS) IMPLANT
SUT CHROMIC 3 0 SH 27 (SUTURE) ×3 IMPLANT
SUT CHROMIC 4 0 P 3 18 (SUTURE) ×3 IMPLANT
SUT ETHILON 4 0 PS 2 18 (SUTURE) ×3 IMPLANT
SUT ETHILON 5 0 P 3 18 (SUTURE) ×2
SUT NYLON ETHILON 5-0 P-3 1X18 (SUTURE) ×4 IMPLANT
SUT SILK 4 0 (SUTURE) ×1
SUT SILK 4-0 18XBRD TIE 12 (SUTURE) ×2 IMPLANT
SWAB COLLECTION DEVICE MRSA (MISCELLANEOUS) IMPLANT
SYR BULB IRRIGATION 50ML (SYRINGE) IMPLANT
SYR TB 1ML LUER SLIP (SYRINGE) IMPLANT
TAPE CLOTH SURG 4X10 WHT LF (GAUZE/BANDAGES/DRESSINGS) ×3 IMPLANT
TOWEL OR 17X24 6PK STRL BLUE (TOWEL DISPOSABLE) ×6 IMPLANT
TRAY ENT MC OR (CUSTOM PROCEDURE TRAY) ×3 IMPLANT
TUBE ANAEROBIC SPECIMEN COL (MISCELLANEOUS) IMPLANT
TUBE CONNECTING 12X1/4 (SUCTIONS) ×3 IMPLANT
WATER STERILE IRR 1000ML POUR (IV SOLUTION) ×3 IMPLANT
YANKAUER SUCT BULB TIP NO VENT (SUCTIONS) IMPLANT

## 2016-11-16 NOTE — Plan of Care (Signed)
Problem: Bowel/Gastric: Goal: Will not experience complications related to bowel motility Outcome: Progressing Patient has experienced bowel movement and has active bowel sounds

## 2016-11-16 NOTE — Progress Notes (Signed)
PROGRESS NOTE    Collin [name]  OVF:[id number] DOB: August 26, 1941 DOA: 11/10/2016 PCP: [name], MD    Brief Narrative:   Collin [name] is a 76 y.o. male with atrial fibrillation diabetes mellitus type 2, nonischemic cardiomyopathy and hyperlipidemia presents to the ER with complaints of persistent nausea vomiting. Patient has had a neck CT yesterday and was told to have good fluid intake. Following which patient started developing multiple episodes of vomiting. Last bowel movement was 3 days ago. Denies any abdominal pain. Patient did come with abdominal pain to the ER 3 days ago and at that time CT of the abdomen did not show any obstruction. Patient was originally scheduled for surgery of the neck mass by Dr. Ezzard Standing on Friday, March 9.  ED Course: Acute abdominal series shows possible small bowel obstruction. Patient had NG tube placed and had 1700 mL of fluid suctioned. On-call general surgeon Dr. Lindie Spruce was consulted. Patient is being admitted for small bowel obstruction. Patient started developing A. fib with RVR while waiting in the ER.  Patient admitted NG tube placed patient placed on bowel rest and supportive care. Patient improved with conservative treatment area NG tube was discontinued. Diet was advanced and patient currently on a soft diet. Patient was on IV Cardizem and has been transitioned to oral Coreg and digoxin which was his home regimen. Diltiazem 30 mg every 6 hours has been added for better rate control in the perioperative period as patient in preparation for tongue mass biopsy. ENT on Tuesday, 11/16/2016.   Assessment & Plan:   Principal Problem:   SBO (small bowel obstruction) Active Problems:   Atrial fibrillation with RVR (HCC)   Nonischemic cardiomyopathy (HCC)   Type 2 diabetes mellitus (HCC)   Tongue mass: Per Ct neck 11/10/2016   Pressure injury of skin   Umbilical hernia without obstruction and without gangrene  #1 small bowel  obstruction/umbilical hernia Resolving per abdominal films. Patient noted to have contrast throughout the colon. Moderate gaseous distention of the small bowel per abdominal films of 11/11/2016. Repeat abdominal films on 11/13/2016 with persistent mildly dilated loops of bowel in the left side of the abdomen measuring up to 4 cm with oral contrast material within the colon. This may reflect partial small bowel obstruction. No significant change compared to 11/11/2016. Patient passing flatus. Patient states had large bowel movement yesterday. Abdomen is soft and nontender to palpation. No further nausea or emesis. Patient tolerated soft diet yesterday. NG tube has been discontinued per general surgery. Replete electrolytes. Mobilize. Patient will need on the local hernia repaired electively. Per general surgery.  #2 A. fib with RVR/NICM Patient currently on Cardizem drip. Patient in A. fib with RVR likely secondary to problem #1. Chest x-ray negative for any acute infiltrates. INR is now subtherapeutic in anticipation of surgery/biopsy of right base tongue mass today, 11/16/2016. INR now 1.38.. Heart rate improving however in the 80s to low 100s with the addition of oral Cardizem. Continue home regimen of Coreg and digitoxin and Cardizem 30 mg by mouth every 8 hours preoperatively and perioperatively. Coumadin on hold in anticipation of tongue biopsy to possibly be done on Tuesday 11/16/2016. Continue to hold anticoagulation. Postoperatively may consolidate Cardizem to Cardizem CD 90-120 mg daily and give inbetween doses of Coreg for better rate control.   ENT to advice when anticoagulation may be resumed postoperatively. Follow.  #3 diabetes mellitus type 2 Hemoglobin A1c was 6.9 on 11/13/2015. Repeat hemoglobin A1c = 6.7. Hold oral hypoglycemic  agents. Continue sliding scale insulin. Follow.  #4 hyperlipidemia Statin on hold. Resume once patient tolerating oral intake and small bowel obstruction has  resolved.  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 Patient was supposed to have a biopsy done and elective removal of shoulder lipoma per Dr.Newman, ENT on 11/12/2016, however this will need to be postponed secondary to acute illness and hospitalization. Will continue to hold patient's anticoagulation. INR currently at 1.38. Case was discussed with Dr. Ezzard Standing on 11/12/2016, and if patient continues to improve and small bowel obstruction has resolved Dr. Ezzard Standing planning on biopsy of tongue mass today, Tuesday, 11/16/2016. Per ENT. Continue incentive spirometry to optimize pulmonary function.  #6 pressure injury of skin Frequent turns.  #7 lower extremity edema Lungs are clear on examination. Patient was on diuretics at home that up and held during the hospitalization. Patient with no respiratory symptoms. Will give Lasix 40 mg IV 1 and resume Lasix home dose 20 mg daily orally tomorrow.    DVT prophylaxis: Lovenox. Code Status: Full Family Communication: Updated patient. No family at bedside. Disposition Plan: Hopefully home when small bowel obstruction has resolved and tolerating oral intake and A. fib with RVR controlled and postop.   Consultants:   General surgery: Dr. Lindie Spruce 11/11/2016  ENT via telephone Dr. Ezzard Standing 11/11/2016  Procedures:   CT soft tissue neck 11/10/2016  Acute abdominal series 11/10/2016  Chest x-ray 11/11/2016  Abdominal films 11/11/2016, 11/13/2016  Antimicrobials:   None   Subjective: Patient states he feels well. Patient using incentive spirometry at bedside. Denies any shortness of breath. No chest pain. No abdominal pain. No nausea. No vomiting. Patient tolerated soft diet yesterday. Patient stated had a large bowel movement yesterday. Passing flatus.   Objective: Vitals:   11/16/16 0309 11/16/16 0520 11/16/16 0744 11/16/16 0810  BP: 132/85 (!)  98/59 138/84   Pulse: 78  85   Resp: 14  (!) 22   Temp: 98.4 F (36.9 C)  98.4 F (36.9 C)   TempSrc: Oral  Oral   SpO2: 96%  94%   Weight:    122.9 kg (270 lb 15.1 oz)  Height:    6' (1.829 m)    Intake/Output Summary (Last 24 hours) at 11/16/16 0902 Last data filed at 11/16/16 0748  Gross per 24 hour  Intake              720 ml  Output             1800 ml  Net            -1080 ml   Filed Weights   11/10/16 2142 11/11/16 1300 11/16/16 0810  Weight: 131.5 kg (290 lb) 128.2 kg (282 lb 10.1 oz) 122.9 kg (270 lb 15.1 oz)    Examination:  General exam: Appears calm and comfortable  Respiratory system: Clear to auscultation. Respiratory effort normal. Cardiovascular system: Irregularly irregular. No JVD, murmurs, rubs, gallops or clicks. 2+ BLE edema. Gastrointestinal system: Abdomen is obese, less distended, softer and nontender. No organomegaly or masses felt. Normal bowel sounds heard. umbilical hernia easily reducible. Central nervous system: Alert and oriented. No focal neurological deficits. Extremities: Symmetric 5 x 5 power. Skin: No rashes, lesions or ulcers Psychiatry: Judgement and insight appear normal. Mood & affect appropriate.     Data Reviewed: I have personally reviewed following labs and imaging studies  CBC:  Recent Labs Lab 11/10/16  2148 11/11/16 0356 11/12/16 0326 11/13/16 0209 11/14/16 0417 11/15/16 0304 11/16/16 0212  WBC 3.5* 2.5* 3.9* 5.0 5.1 5.1 4.1  NEUTROABS 2.6 1.5*  --   --   --  3.9 2.8  HGB 15.6 14.2 13.2 13.9 14.2 14.5 13.8  HCT 48.0 43.9 42.0 43.9 44.5 43.9 42.8  MCV 90.2 90.7 92.1 91.5 90.8 89.6 89.7  PLT 177 164 153 159 155 175 170   Basic Metabolic Panel:  Recent Labs Lab 11/12/16 0326 11/13/16 0209 11/14/16 0417 11/15/16 0304 11/16/16 0212  NA 141 137 137 137 138  K 3.6 4.0 3.4* 4.1 3.9  CL 106 103 100* 102 101  CO2 28 22 29 26 27   GLUCOSE 136* 138* 148* 119* 115*  BUN 14 12 9 11 16   CREATININE 0.82 0.73 0.68  0.71 0.80  CALCIUM 8.3* 8.4* 8.8* 8.9 9.0  MG 1.8 2.1 2.1 2.1  --    GFR: Estimated Creatinine Clearance: 108 mL/min (by C-G formula based on SCr of 0.8 mg/dL). Liver Function Tests:  Recent Labs Lab 11/10/16 2148 11/11/16 0356  AST 20 18  ALT 23 20  ALKPHOS 84 72  BILITOT 1.1 0.9  PROT 8.3* 7.0  ALBUMIN 4.0 3.6    Recent Labs Lab 11/10/16 2148  LIPASE 24   No results for input(s): AMMONIA in the last 168 hours. Coagulation Profile:  Recent Labs Lab 11/12/16 0326 11/13/16 0209 11/14/16 0417 11/15/16 0304 11/16/16 0212  INR 2.69 2.40 1.91 1.64 1.38   Cardiac Enzymes: No results for input(s): CKTOTAL, CKMB, CKMBINDEX, TROPONINI in the last 168 hours. BNP (last 3 results)  Recent Labs  09/16/16 1444  PROBNP 1,110*   HbA1C: No results for input(s): HGBA1C in the last 72 hours. CBG:  Recent Labs Lab 11/15/16 0734 11/15/16 1132 11/15/16 1701 11/15/16 2115 11/16/16 0745  GLUCAP 116* 194* 137* 153* 113*   Lipid Profile: No results for input(s): CHOL, HDL, LDLCALC, TRIG, CHOLHDL, LDLDIRECT in the last 72 hours. Thyroid Function Tests: No results for input(s): TSH, T4TOTAL, FREET4, T3FREE, THYROIDAB in the last 72 hours. Anemia Panel: No results for input(s): VITAMINB12, FOLATE, FERRITIN, TIBC, IRON, RETICCTPCT in the last 72 hours. Sepsis Labs:  Recent Labs Lab 11/10/16 2335 11/11/16 0252  LATICACIDVEN 1.27 1.12    Recent Results (from the past 240 hour(s))  MRSA PCR Screening     Status: None   Collection Time: 11/11/16  1:43 PM  Result Value Ref Range Status   MRSA by PCR NEGATIVE NEGATIVE Final    Comment:        The GeneXpert MRSA Assay (FDA approved for NASAL specimens only), is one component of a comprehensive MRSA colonization surveillance program. It is not intended to diagnose MRSA infection nor to guide or monitor treatment for MRSA infections.          Radiology Studies: No results found.      Scheduled Meds: .  [MAR Hold] carvedilol  25 mg Oral BID WC  .  ceFAZolin (ANCEF) IV  3 g Intravenous To SS-Surg  . [MAR Hold] chlorhexidine  15 mL Mouth Rinse BID  . [MAR Hold] digoxin  0.125 mg Oral Daily  . [MAR Hold] diltiazem  30 mg Oral Q8H  . [MAR Hold] enoxaparin (LOVENOX) injection  65 mg Subcutaneous Q24H  . [MAR Hold] furosemide  20 mg Oral Daily  . [MAR Hold] insulin aspart  0-15 Units Subcutaneous TID WC  . [MAR Hold] mouth rinse  15 mL Mouth Rinse q12n4p  . [  MAR Hold] pravastatin  40 mg Oral Daily   Continuous Infusions:    LOS: 5 days    Time spent: 40 mins    THOMPSON,DANIEL, MD [facility] Pager [phone]  If 7PM-7AM, please contact night-coverage [email or web address] Password [email or web address] 11/16/2016, 9:02 AM

## 2016-11-16 NOTE — Progress Notes (Signed)
ANTICOAGULATION CONSULT NOTE - Initial Consult  Pharmacy Consult for coumadin  Indication: atrial fibrillation  Allergies  Allergen Reactions  . Niaspan [Niacin Er] Other (See Comments)    REACTION NOT AN ALLERGY >  FLUSHED, EXPECTED W/NIACIN    Patient Measurements: Height: 6' (182.9 cm) Weight: 270 lb 15.1 oz (122.9 kg) IBW/kg (Calculated) : 77.6  Vital Signs: Temp: 97.7 F (36.5 C) (03/13 1251) Temp Source: Oral (03/13 1251) BP: 98/66 (03/13 1251) Pulse Rate: 85 (03/13 1251)  Labs:  Recent Labs  11/14/16 0417 11/15/16 0304 11/16/16 0212  HGB 14.2 14.5 13.8  HCT 44.5 43.9 42.8  PLT 155 175 170  LABPROT 22.2* 19.6* 17.0*  INR 1.91 1.64 1.38  CREATININE 0.68 0.71 0.80    Estimated Creatinine Clearance: 108 mL/min (by C-G formula based on SCr of 0.8 mg/dL).   Medical History: Past Medical History:  Diagnosis Date  . Atrial fibrillation ([place])   . Cardiomyopathy, nonischemic ([place]) 1998  . Diabetes mellitus   . Heart disease   . Hyperlipidemia   . OSA (obstructive sleep apnea)      Assessment: 76 yo male on coumadin prior to arrival for atrial fibrillation. Coumadin on hold since admission for tongue biopsy for evaluation of tongue mass. S/p biopsy earlier today and to resume coumadin this evening. INR 1.38 and CBC stable. Prior to arrival dose of coumadin is 7.5 mg on Monday and 5 mg all other days.    Goal of Therapy:  INR 2-3 Monitor platelets by anticoagulation protocol: Yes   Plan:  1. Coumadin 7.5 mg x 1 this evening  2. Daily INR  Vincenza Hews, PharmD, BCPS 11/16/2016, 5:08 PM

## 2016-11-16 NOTE — Transfer of Care (Signed)
Immediate Anesthesia Transfer of Care Note  Patient: Collin [name]  Procedure(s) Performed: Procedure(s): DIRECT LARYNGOSCOPY AND BIOPSY (N/A) EXCISION RIGHT NECK LIPOMA (Right)  Patient Location: PACU  Anesthesia Type:General  Level of Consciousness: awake, oriented, sedated, patient cooperative and responds to stimulation  Airway & Oxygen Therapy: Patient Spontanous Breathing and Patient connected to nasal cannula oxygen  Post-op Assessment: Report given to RN, Post -op Vital signs reviewed and stable, Patient moving all extremities and Patient moving all extremities X 4  Post vital signs: Reviewed and stable  Last Vitals:  Vitals:   11/16/16 0520 11/16/16 0744  BP: (!) 98/59 138/84  Pulse:  85  Resp:  (!) 22  Temp:  36.9 C    Last Pain:  Vitals:   11/16/16 0744  TempSrc: Oral  PainSc: 0-No pain         Complications: No apparent anesthesia complications

## 2016-11-16 NOTE — Anesthesia Postprocedure Evaluation (Signed)
Anesthesia Post Note  Patient: Collin [name]  Procedure(s) Performed: Procedure(s) (LRB): DIRECT LARYNGOSCOPY AND BIOPSY (N/A) EXCISION RIGHT NECK LIPOMA (Right)  Patient location during evaluation: PACU Anesthesia Type: General Level of consciousness: awake and alert Pain management: pain level controlled Vital Signs Assessment: post-procedure vital signs reviewed and stable Respiratory status: spontaneous breathing, nonlabored ventilation, respiratory function stable and patient connected to nasal cannula oxygen Cardiovascular status: blood pressure returned to baseline and stable Postop Assessment: no signs of nausea or vomiting Anesthetic complications: no       Last Vitals:  Vitals:   11/16/16 1245 11/16/16 1251  BP: 110/75 98/66  Pulse: 80 85  Resp: 16 15  Temp: 36.6 C 36.5 C    Last Pain:  Vitals:   11/16/16 1251  TempSrc: Oral  PainSc: 0-No pain                 Chanell Nadeau DAVID

## 2016-11-16 NOTE — Brief Op Note (Signed)
11/10/2016 - 11/16/2016  11:38 AM  PATIENT:  Collin [name]  76 y.o. male  PRE-OPERATIVE DIAGNOSIS:  TONQUE MASS , RIGHT Neck Lipoma  POST-OPERATIVE DIAGNOSIS:  base of tongue mass, large right neck lipoma   Bx + SCCa  PROCEDURE:  Procedure(s): DIRECT LARYNGOSCOPY AND BIOPSY (N/A) EXCISION RIGHT NECK LIPOMA (Right)  SURGEON:  Surgeon(s) and Role:    * Rozetta Nunnery, MD - Primary  PHYSICIAN ASSISTANT:   ASSISTANTS: none   ANESTHESIA:   general  EBL:  Total I/O In: 500 [I.V.:500] Out: 410 [Urine:400; Blood:10]  BLOOD ADMINISTERED:none  DRAINS: none   LOCAL MEDICATIONS USED:  LIDOCAINE with EPI 8 cc  SPECIMEN:  Source of Specimen:  right neck lipoma, and base of tongue tissue  DISPOSITION OF SPECIMEN:  PATHOLOGY  COUNTS:  YES  TOURNIQUET:  * No tourniquets in log *  DICTATION: Viviann Spare Dictation  [phone]  PLAN OF CARE: Admit for overnight observation  PATIENT DISPOSITION:  PACU - hemodynamically stable.   Delay start of Pharmacological VTE agent (>24hrs) due to surgical blood loss or risk of bleeding: yes

## 2016-11-16 NOTE — Interval H&P Note (Signed)
History and Physical Interval Note:  11/16/2016 9:39 AM  Collin [name]  has presented today for surgery, with the diagnosis of TONQUE MASS  The various methods of treatment have been discussed with the patient and family. After consideration of risks, benefits and other options for treatment, the patient has consented to  Procedure(s): DIRECT LARYNGOSCOPY AND BIOPSY (N/A) EXCISION RIGHT SHOULDER LIPOMA POSSIBLE EXCISION RIGHT NECK NODE (Right) as a surgical intervention .  The patient's history has been reviewed, patient examined, no change in status, stable for surgery.  I have reviewed the patient's chart and labs.  Questions were answered to the patient's satisfaction.     Collin [name]

## 2016-11-16 NOTE — Discharge Instructions (Addendum)
° °  Atrial Fibrillation Atrial fibrillation is a type of heartbeat that is irregular or fast (rapid). If you have this condition, your heart keeps quivering in a weird (chaotic) way. This condition can make it so your heart cannot pump blood normally. Having this condition gives a person more risk for stroke, heart failure, and other heart problems. There are different types of atrial fibrillation. Talk with your doctor to learn about the type that you have. Follow these instructions at home:  Take over-the-counter and prescription medicines only as told by your doctor.  If your doctor prescribed a blood-thinning medicine, take it exactly as told. Taking too much of it can cause bleeding. If you do not take enough of it, you will not have the protection that you need against stroke and other problems.  Do not use any tobacco products. These include cigarettes, chewing tobacco, and e-cigarettes. If you need help quitting, ask your doctor.  If you have apnea (obstructive sleep apnea), manage it as told by your doctor.  Do not drink alcohol.  Do not drink beverages that have caffeine. These include coffee, soda, and tea.  Maintain a healthy weight. Do not use diet pills unless your doctor says they are safe for you. Diet pills may make heart problems worse.  Follow diet instructions as told by your doctor.  Exercise regularly as told by your doctor.  Keep all follow-up visits as told by your doctor. This is important. Contact a doctor if:  You notice a change in the speed, rhythm, or strength of your heartbeat.  You are taking a blood-thinning medicine and you notice more bruising.  You get tired more easily when you move or exercise. Get help right away if:  You have pain in your chest or your belly (abdomen).  You have sweating or weakness.  You feel sick to your stomach (nauseous).  You notice blood in your throw up (vomit), poop (stool), or pee (urine).  You are short of  breath.  You suddenly have swollen feet and ankles.  You feel dizzy.  Your suddenly get weak or numb in your face, arms, or legs, especially if it happens on one side of your body.  You have trouble talking, trouble understanding, or both.  Your face or your eyelid droops on one side. These symptoms may be an emergency. Do not wait to see if the symptoms will go away. Get medical help right away. Call your local emergency services (911 in the [place] Do not drive yourself to the hospital. This information is not intended to replace advice given to you by your health care provider. Make sure you discuss any questions you have with your health care provider. Document Released: 06/01/2008 Document Revised: 01/29/2016 Document Reviewed: 12/18/2014 Elsevier Interactive Patient Education  2017 [place]. Tylenol or motrin prn pain Can remove shoulder dressing and get incision site wet in 24 hrs Call Dr Pollie Friar office for follow up appt. In 1 week to get sutures removed from right neck    [phone] Can apply antibiotic ointment to incision site when dressing removed

## 2016-11-16 NOTE — Progress Notes (Signed)
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

## 2016-11-16 NOTE — Anesthesia Procedure Notes (Signed)
Procedure Name: Intubation Date/Time: 11/16/2016 9:57 AM Performed by: Jacquiline Doe A Pre-anesthesia Checklist: Patient identified, Emergency Drugs available, Suction available and Patient being monitored Patient Re-evaluated:Patient Re-evaluated prior to inductionOxygen Delivery Method: Circle System Utilized and Circle system utilized Preoxygenation: Pre-oxygenation with 100% oxygen Intubation Type: IV induction Ventilation: Mask ventilation without difficulty and Oral airway inserted - appropriate to patient size Laryngoscope Size: Mac and 4 Grade View: Grade I Tube type: Oral Tube size: 7.0 mm Number of attempts: 1 Airway Equipment and Method: Stylet Placement Confirmation: ETT inserted through vocal cords under direct vision,  positive ETCO2 and breath sounds checked- equal and bilateral Secured at: 22 cm Tube secured with: Tape Dental Injury: Teeth and Oropharynx as per pre-operative assessment

## 2016-11-16 NOTE — Anesthesia Preprocedure Evaluation (Addendum)
Anesthesia Evaluation  Patient identified by MRN, date of birth, ID band Patient awake    Reviewed: Allergy & Precautions, NPO status , Patient's Chart, lab work & pertinent test results  Airway Mallampati: II  TM Distance: >3 FB Neck ROM: Full    Dental   Pulmonary sleep apnea ,    Pulmonary exam normal        Cardiovascular Normal cardiovascular exam+ Cardiac Defibrillator   ECHO 8/15 - Left ventricle: The cavity size was mildly dilated. There was moderate concentric hypertrophy. Systolic function was normal. The estimated ejection fraction was in the range of 55% to 60%. Wall motion was normal; there were no regional wall motion abnormalities. - Aorta: Aortic root dimension: 40 mm (ED). Ascending aortic diameter: 37 mm (S). - Ascending aorta: The ascending aorta was mildly dilated. - Left atrium: The atrium was severely dilated. - Right ventricle: The cavity size was mildly dilated. Wall thickness was normal. - Right atrium: The atrium was severely dilated. - Atrial septum: There was increased thickness of the septum, consistent with lipomatous hypertrophy. - Tricuspid valve: There was mild regurgitation.   Neuro/Psych    GI/Hepatic   Endo/Other  diabetes, Type 2, Oral Hypoglycemic Agents  Renal/GU      Musculoskeletal   Abdominal   Peds  Hematology   Anesthesia Other Findings   Reproductive/Obstetrics                            Anesthesia Physical Anesthesia Plan  ASA: III  Anesthesia Plan: General   Post-op Pain Management:    Induction: Intravenous  Airway Management Planned: Oral ETT  Additional Equipment:   Intra-op Plan:   Post-operative Plan: Extubation in OR  Informed Consent: I have reviewed the patients History and Physical, chart, labs and discussed the procedure including the risks, benefits and alternatives for the proposed anesthesia with the  patient or authorized representative who has indicated his/her understanding and acceptance.     Plan Discussed with: CRNA and Surgeon  Anesthesia Plan Comments:         Anesthesia Quick Evaluation

## 2016-11-16 NOTE — Op Note (Signed)
[name], [name]             ACCOUNT NO.:  [id number]  MEDICAL RECORD NO.:  [id number]  LOCATION:                               FACILITY:  [facility]  PHYSICIAN:  Kristine Garbe. 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  OPERATION PERFORMED:  Direct laryngoscopy and biopsy of base of tongue mass (frozen section positive for squamous cell carcinoma).  Excision of large right neck lipoma, 10 x 12 cm in size with intermediate closure.  SURGEON:  Kristine Garbe. Ezzard Standing, MD.  ANESTHESIA:  General endotracheal.  COMPLICATIONS:  None.  BRIEF CLINICAL NOTE:  Collin [name] is a 76 year old gentleman who has had an adenopathy in the right neck for over a year.  On recent exam in the office, he has a base of tongue mass and CT scan demonstrated a large right base of tongue mass with right neck adenopathy suspicious for metastatic carcinoma.  In addition, the patient had very large right neck lipoma, he has had for years.  He is requiring direct laryngoscopy and biopsy for diagnosis of a base of tongue mass and at the same time would like to have a large right neck lipoma removed since he will be under general anesthetic for the direct laryngoscopy and biopsy.  DESCRIPTION OF PROCEDURE:  After adequate endotracheal anesthesia, with a #7 endotracheal tube, direct laryngoscopy was performed.  Oral cavity was clear.  Base of tongue had a partially exophytic mass extending down to the vallecula.  The epiglottis was uninvolved.  On direct laryngoscopy of the laryngeal surface, epiglottis was clear.  AE folds were clear.  Vocal cords were clear.  Piriform sinuses were  clear. Next, biopsies were obtained from the base of tongue and sent for frozen section.  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  While waiting for the frozen section, the patient was turned up on his left shoulder and the right neck was prepped with Betadine solution and draped in a sterile towels.  The lipoma was marked and the proposed incision site was injected with 80 mL of Xylocaine with epinephrine for local hemostasis.  A horizontal incision was made directly over the lipoma.  Dissection was carried down through the subcutaneous tissues.  A large lipoma was encountered.  The lipoma was removed with a cautery and blunt dissection and sent as a separate specimen.  Hemostasis was obtained with cautery.  The wound was then irrigated with 30-40 mL of saline and the defect was closed with 3-0 chromic sutures subcutaneously and 5-0 nylon to reapproximate skin edges.  Pressure dressing was applied along with bacitracin ointment. This completed the procedure.  [name] was subsequently awoken from anesthesia and transferred to recovery room, postop doing well.  DISPOSITION:  He will most likely be discharged from the floor tomorrow. He is scheduled for a PET scan next Monday.  We will have him follow up in my office in  1 week to have the sutures removed from his lipoma excision.          ______________________________ Kristine Garbe Ezzard Standing, M.D.     CEN/MEDQ  D:  11/16/2016  T:  11/16/2016  Job:  [id number]

## 2016-11-16 NOTE — H&P (View-Only) (Signed)
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 He is scheduled for direct laryngoscopy and biopsy in [facility] OR on Tuesday at 9:30

## 2016-11-16 NOTE — Op Note (Deleted)
  The note originally documented on this encounter has been moved the the encounter in which it belongs.  

## 2016-11-17 ENCOUNTER — Telehealth: Payer: Self-pay | Admitting: *Deleted

## 2016-11-17 ENCOUNTER — Encounter (HOSPITAL_COMMUNITY): Payer: Self-pay | Admitting: Otolaryngology

## 2016-11-17 ENCOUNTER — Inpatient Hospital Stay (HOSPITAL_COMMUNITY): Payer: Medicare HMO

## 2016-11-17 DIAGNOSIS — C01 Malignant neoplasm of base of tongue: Secondary | ICD-10-CM

## 2016-11-17 DIAGNOSIS — Z01818 Encounter for other preprocedural examination: Secondary | ICD-10-CM

## 2016-11-17 LAB — BASIC METABOLIC PANEL
Anion gap: 7 (ref 5–15)
BUN: 19 mg/dL (ref 6–20)
CO2: 30 mmol/L (ref 22–32)
Calcium: 9.1 mg/dL (ref 8.9–10.3)
Chloride: 99 mmol/L — ABNORMAL LOW (ref 101–111)
Creatinine, Ser: 0.96 mg/dL (ref 0.61–1.24)
GFR calc Af Amer: >60 mL/min (ref >60)
GFR calc non Af Amer: >60 mL/min (ref >60)
Glucose, Bld: 159 mg/dL — ABNORMAL HIGH (ref 65–99)
Potassium: 4.4 mmol/L (ref 3.5–5.1)
Sodium: 136 mmol/L (ref 135–145)

## 2016-11-17 LAB — CBC WITH DIFFERENTIAL/PLATELET
Basophils Absolute: 0 10*3/uL (ref 0.0–0.1)
Basophils Relative: 0 %
Eosinophils Absolute: 0 10*3/uL (ref 0.0–0.7)
Eosinophils Relative: 0 %
HCT: 42.6 % (ref 39.0–52.0)
Hemoglobin: 13.8 g/dL (ref 13.0–17.0)
Lymphocytes Relative: 8 %
Lymphs Abs: 0.5 10*3/uL — ABNORMAL LOW (ref 0.7–4.0)
MCH: 29.2 pg (ref 26.0–34.0)
MCHC: 32.4 g/dL (ref 30.0–36.0)
MCV: 90.3 fL (ref 78.0–100.0)
Monocytes Absolute: 0.2 10*3/uL (ref 0.1–1.0)
Monocytes Relative: 4 %
Neutro Abs: 5.1 10*3/uL (ref 1.7–7.7)
Neutrophils Relative %: 88 %
Platelets: 214 10*3/uL (ref 150–400)
RBC: 4.72 MIL/uL (ref 4.22–5.81)
RDW: 14.2 % (ref 11.5–15.5)
WBC: 5.8 10*3/uL (ref 4.0–10.5)

## 2016-11-17 LAB — GLUCOSE, CAPILLARY: Glucose-Capillary: 133 mg/dL — ABNORMAL HIGH (ref 65–99)

## 2016-11-17 LAB — PROTIME-INR
INR: 1.28
Prothrombin Time: 16.1 seconds — ABNORMAL HIGH (ref 11.4–15.2)

## 2016-11-17 MED ORDER — DILTIAZEM HCL 90 MG PO TABS: 90.0000 mg | ORAL_TABLET | Freq: Every day | ORAL | 3 refills | Status: DC

## 2016-11-17 MED ORDER — WARFARIN SODIUM 5 MG PO TABS: 7.5000 mg | ORAL_TABLET | Freq: Once | ORAL | Status: DC

## 2016-11-17 MED ORDER — SENNOSIDES-DOCUSATE SODIUM 8.6-50 MG PO TABS: 1.0000 | ORAL_TABLET | Freq: Every day | ORAL | 3 refills | Status: DC

## 2016-11-17 MED ORDER — FUROSEMIDE 20 MG PO TABS: 20.0000 mg | ORAL_TABLET | Freq: Every day | ORAL | 3 refills | Status: DC

## 2016-11-17 MED ORDER — ONDANSETRON 4 MG PO TBDP: 4.0000 mg | ORAL_TABLET | Freq: Three times a day (TID) | ORAL | 0 refills | Status: DC | PRN

## 2016-11-17 MED ORDER — POLYETHYLENE GLYCOL 3350 17 G PO PACK: 17.0000 g | PACK | Freq: Every day | ORAL | 0 refills | Status: DC | PRN

## 2016-11-17 NOTE — Telephone Encounter (Signed)
Oncology Nurse Navigator Documentation  Spoke with patient wife.

## 2016-11-17 NOTE — Progress Notes (Signed)
Discussed and explained discharge instructions, f/u appt. And prescriptions given to wife. Pt going home with wife with belongings via w/c

## 2016-11-17 NOTE — Progress Notes (Signed)
Head and Neck Cancer Location of Tumor / Histology:  Base of Tongue demonstrates squamous cell carcinoma.   Patient presented  months ago with symptoms of: Enlarging right neck mass.   Biopsies of revealed:  11/16/16 Diagnosis 1. Tongue, biopsy, Base - SQUAMOUS CELL CARCINOMA. - SEE MICROSCOPIC DESCRIPTION 2. Soft tissue, lipoma, Right Neck - BENIGN ADIPOSE TISSUE CONSISTENT WITH LIPOMA. - NO EVIDENCE OF MALIGNANCY.  Nutrition Status Yes No Comments  Weight changes? [x]  []  He has lost about 20 lbs in one month  Swallowing concerns? []  [x]  He denies  PEG? []  [x]     Referrals Yes No Comments  Social Work? []  [x]    Dentistry? [x]  []  Dr. Enrique Sack 11/17/16 & Dr. Loyal Gambler 11/24/16  Swallowing therapy? []  [x]    Nutrition? []  [x]    Med/Onc? [x]  []  Dr. Alvy Bimler 11/24/16   Safety Issues Yes No Comments  Prior radiation? []  [x]    Pacemaker/ICD? [x]  []  ICD  Possible current pregnancy? []  [x]    Is the patient on methotrexate? []  [x]     Tobacco/Marijuana/Snuff/ETOH use: Never a smoker. No alcohol use.   Past/Anticipated interventions by otolaryngology, if any: PROCEDURE:  Procedure(s): DIRECT LARYNGOSCOPY AND BIOPSY (N/A) EXCISION RIGHT NECK LIPOMA (Right)  SURGEON:  Surgeon(s) and Role:    * Rozetta Nunnery, MD - Primary  Past/Anticipated interventions by medical oncology, if any:  Dr. Alvy Bimler 11/24/16     Current Complaints / other details:   11/22/16 PET IMPRESSION: 1. Hypermetabolic base of tongue mass compatible with known neoplasm. 2. 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 3. No evidence for distant metastatic disease 4. Aortic atherosclerosis and coronary artery calcification 5. Gallstones  11/10/16 IMPRESSION: 1. 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 The mass measures up to 37 mm. Visible mass invades base of tongue 27 mm and extends 9 mm to contralateral base of tongue. 2. Right level 2, 3, 4, 5a/b  necrotic lymphadenopathy with findings of extra nodal extension. Left level 2 lymphadenopathy. These results will be called to the ordering clinician or representative by the Radiologist Assistant, and communication documented in the PACS or zVision Dashboard.  He was admitted recently and discharged on 11/17/16. He had a small bowel obstruction which resolved with conservative treatment.   BP 124/79   Pulse 62   Temp 97.9 F (36.6 C)   Ht 6' (1.829 m)   Wt 271 lb 6.4 oz (123.1 kg)   SpO2 97% Comment: room air  BMI 36.81 kg/m    Wt Readings from Last 3 Encounters:  11/24/16 271 lb 6.4 oz (123.1 kg)  11/16/16 270 lb 15.1 oz (122.9 kg)  09/17/16 282 lb 12.8 oz (128.3 kg)

## 2016-11-17 NOTE — Progress Notes (Signed)
Pt transported to xray via w/c

## 2016-11-17 NOTE — Discharge Summary (Addendum)
Physician Discharge Summary   Patient ID: Collin [name] MRN: [id number] DOB/AGE: June 23, 1941 76 y.o.  Admit date: 11/10/2016 Discharge date: 11/17/2016  Primary Care Physician:  Collin Covey, MD  Discharge Diagnoses:    . SBO (small bowel obstruction) . Atrial fibrillation with RVR (HCC) . Tongue mass: Per Ct neck 11/10/2016    Diabetes mellitus type 2    Hyperlipidemia      Consults:   Gen. surgery, Dr. Lindie Spruce ENT, Dr. Ezzard Standing  Recommendations for Outpatient Follow-up:  1. Outpatient PET scan is scheduled on 11/22/16 at [facility] nuclear medicine Department 2. Please repeat CBC/BMET at next visit   DIET: Heart healthy    Allergies:   Allergies  Allergen Reactions  . Niaspan [Niacin Er] Other (See Comments)    REACTION NOT AN ALLERGY >  FLUSHED, EXPECTED W/NIACIN     DISCHARGE MEDICATIONS: Current Discharge Medication List    START taking these medications   Details  diltiazem (CARDIZEM) 90 MG tablet Take 1 tablet (90 mg total) by mouth daily. Qty: 30 tablet, Refills: 3    polyethylene glycol (MIRALAX) packet Take 17 g by mouth daily as needed. Available OTC Qty: 30 each, Refills: 0    senna-docusate (SENOKOT S) 8.6-50 MG tablet Take 1 tablet by mouth at bedtime. Qty: 30 tablet, Refills: 3      CONTINUE these medications which have CHANGED   Details  furosemide (LASIX) 20 MG tablet Take 1 tablet (20 mg total) by mouth daily. Qty: 30 tablet, Refills: 3    ondansetron (ZOFRAN ODT) 4 MG disintegrating tablet Take 1 tablet (4 mg total) by mouth every 8 (eight) hours as needed for nausea or vomiting. Qty: 20 tablet, Refills: 0      CONTINUE these medications which have NOT CHANGED   Details  acetaminophen (TYLENOL) 500 MG tablet Take 1,000 mg by mouth every 6 (six) hours as needed for moderate pain.    bisacodyl (BISACODYL) 5 MG EC tablet Take 5 mg by mouth daily as needed for moderate constipation.    carvedilol (COREG) 25 MG tablet TAKE 1 TABLET  TWICE DAILY WITH MEALS Qty: 180 tablet, Refills: 1    digoxin (LANOXIN) 0.125 MG tablet Take 1 tablet (0.125 mg total) by mouth daily. Qty: 90 tablet, Refills: 3    metFORMIN (GLUCOPHAGE) 500 MG tablet TAKE 1 TABLET TWICE DAILY WITH MEALS Qty: 180 tablet, Refills: 2    pravastatin (PRAVACHOL) 40 MG tablet TAKE 1 TABLET EVERY DAY Qty: 90 tablet, Refills: 2    warfarin (COUMADIN) 5 MG tablet Take as directed by anticoagulation clinic. Qty: 105 tablet, Refills: 1      STOP taking these medications     ramipril (ALTACE) 5 MG capsule          Brief H and P: For complete details please refer to admission H and P, but in briefRobert D Johnstonis a 75 y.o.malewith atrial fibrillation diabetes mellitus type 2, nonischemic cardiomyopathy and hyperlipidemia presents to the ER with complaints of persistent nausea vomiting. Patient has had a neck CT yesterday and was told to have good fluid intake. Following which patient started developing multiple episodes of vomiting. Last bowel movement was 3 days ago. Denies any abdominal pain. Patient did come with abdominal pain to the ER 3 days ago and at that time CT of the abdomen did not show any obstruction. Patient was originally scheduled for surgery of the neck mass by Dr. Ezzard Standing on Friday, March 9.  ED Course:Acute abdominal series shows  possible small bowel obstruction. Patient had NG tube placed and had 1700 mL of fluid suctioned. On-call general surgeon Dr. Lindie Spruce was consulted. Patient is being admitted for small bowel obstruction.Patient started developing A. fib with RVR while waiting in the ER. Patient admitted NG tube placed patient placed on bowel rest and supportive care. Patient improved with conservative treatment area NG tube was discontinued. Diet was advanced and patient currently on a soft diet. Patient was on IV Cardizem and has been transitioned to oral Coreg and digoxin which was his home regimen. Diltiazem 30 mg every 6 hours  has been added for better rate control in the perioperative period as patient in preparation for tongue mass biopsy. ENT on Tuesday, 11/16/2016.  Hospital Course:  Partial small bowel obstruction/umbilical hernia - Resolved per abdominal films. Patient noted to have contrast throughout the colon. Moderate gaseous distention of the small bowel per abdominal films of 11/11/2016. Repeat abdominal films on 11/13/2016 with persistent mildly dilated loops of bowel in the left side of the abdomen measuring up to 4 cm with oral contrast material within the colon. This may reflect partial small bowel obstruction. No significant change compared to 11/11/2016. Patient passing flatus. Patient reported large BM on 11/15/16  - Currently tolerating diet, abdomen soft and nontender, no further nausea or vomiting. NG tube has been discontinued.  - Patient will need on the local hernia repaired electively per general surgery.  Atrial fibrillation with RVR/NICM - The patient was placed on Cardizem drip.A. fib with RVR likely secondary to partial small bowel obstruction. -  Chest x-ray negative for any acute infiltrates.  - Heart rate now improved with addition of Cardizem, continue Coreg and digoxin  - INR subtherapeutic, Coumadin has been restarted, postoperatively after the tongue biopsy once cleared by ENT.   diabetes mellitus type 2 Hemoglobin A1c was 6.9 on 11/13/2015. Repeat hemoglobin A1c = 6.7.  Patient was placed on sliding scale insulin while inpatient  hyperlipidemia Resume statin.   right base of the tongue mass/necrotic lymphadenopathy on the right neck with findings of extranodal extension - Per CT soft tissue neck. Patient was supposed to have a biopsy done and elective removal of shoulder lipoma per Dr.Newman, ENT on 11/12/2016 -  Case was discussed with Dr. Ezzard Standing on 11/12/2016. Patient underwent tongue biopsy on 11/16/16. - Per Dr. 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 Orthopantogram showed multiple upper teeth had apical lucencies dental disease, cleared to be discharged home   pressure injury of skin Frequent turns.   lower extremity edema Lungs are clear on examination. Patient was on diuretics at home that up and held during the hospitalization. Patient with no respiratory symptoms. - Patient was given Lasix 40 mg IV 1 and resumed Lasix home dose 20 mg daily orally     Day of Discharge BP 118/85 (BP Location: Right Arm)   Pulse 95   Temp 98.6 F (37 C) (Oral)   Resp 18   Ht 6' (1.829 m)   Wt 122.9 kg (270 lb 15.1 oz)   SpO2 97%   BMI 36.75 kg/m   Physical Exam: General: Alert and awake oriented x3 not in any acute distress. HEENT: anicteric sclera, pupils reactive to light and accommodation CVS: S1-S2 clear no murmur rubs or gallops Chest: clear to auscultation bilaterally, no wheezing rales or rhonchi Abdomen: soft nontender, nondistended, normal bowel sounds Extremities: no cyanosis, clubbing or edema noted bilaterally Neuro: Cranial  nerves II-XII intact, no focal neurological deficits   The results of significant diagnostics from this hospitalization (including imaging, microbiology, ancillary and laboratory) are listed below for reference.    LAB RESULTS: Basic Metabolic Panel:  Recent Labs Lab 11/15/16 0304 11/16/16 0212 11/17/16 0211  NA 137 138 136  K 4.1 3.9 4.4  CL 102 101 99*  CO2 26 27 30   GLUCOSE 119* 115* 159*  BUN 11 16 19   CREATININE 0.71 0.80 0.96  CALCIUM 8.9 9.0 9.1  MG 2.1  --   --    Liver Function Tests:  Recent Labs Lab 11/10/16 2148 11/11/16 0356  AST 20 18  ALT 23 20  ALKPHOS 84 72  BILITOT 1.1 0.9  PROT 8.3* 7.0  ALBUMIN 4.0 3.6    Recent Labs Lab 11/10/16 2148  LIPASE 24   No results for input(s): AMMONIA in the last 168 hours. CBC:  Recent Labs Lab 11/16/16 0212 11/17/16 0211  WBC 4.1 5.8  NEUTROABS 2.8 5.1  HGB 13.8  13.8  HCT 42.8 42.6  MCV 89.7 90.3  PLT 170 214   Cardiac Enzymes: No results for input(s): CKTOTAL, CKMB, CKMBINDEX, TROPONINI in the last 168 hours. BNP: Invalid input(s): POCBNP CBG:  Recent Labs Lab 11/16/16 2055 11/17/16 0751  GLUCAP 203* 133*    Significant Diagnostic Studies:  Ct Soft Tissue Neck W Contrast  Result Date: 11/10/2016 CLINICAL DATA:  76 y/o  M; right neck mass. EXAM: CT NECK WITH CONTRAST TECHNIQUE: Multidetector CT imaging of the neck was performed using the standard protocol following the bolus administration of intravenous contrast. CONTRAST:  75mL ISOVUE-300 IOPAMIDOL (ISOVUE-300) INJECTION 61% COMPARISON:  None. FINDINGS: Pharynx and larynx: Right base of tongue mass measuring 36 x 32 x 37 mm (AP x ML x CC series 3, image 37 and series 200, image 56). The mass effaces the right vallecula glossopharyngeal sulcus and inferiorly abuts the hyoid bone. The mass invades the tongue 27 mm and extends approximately 9 mm into the contralateral base of tongue. Salivary glands: No inflammation, mass, or stone. Thyroid: Subcentimeter low hypodense foci probably represent nodules. Lymph nodes: Necrotic lymphadenopathy in the right level 2, 3, 4, and 5a/b stations. Left-sided cervical necrotic lymphadenopathy at the level 2 station. The largest lymph node in the right level 2 station measures 41 x 39 x 49 mm (AP x ML x CC). There is stranding surrounding the right upper cervical lymph nodes with irregular margins for example (series 200, image 39) and an indistinct border between the right submandibular gland and adjacent adenopathy (series 3, image 47) indicating probable extra nodal extension. Vascular: Patent carotid and vertebral arteries of the neck. Patent internal jugular veins. Moderate calcific atherosclerosis of carotid bifurcations without high-grade stenosis. Brief submucosal retropharyngeal course of proximal ICA bilaterally. Limited intracranial: Negative. Visualized  orbits: Not within the field of view. Mastoids and visualized paranasal sinuses: Right greater than left maxillary sinus mucosal thickening. Skeleton: No acute fracture or suspicious osseous lesion. Moderate cervical spondylosis with predominantly discogenic degenerative changes. No high-grade bony canal stenosis. Upper chest: Aortic atherosclerosis with moderate calcification. Other: None. IMPRESSION: 1. 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 The mass measures up to 37 mm. Visible mass invades base of tongue 27 mm and extends 9 mm to contralateral base of tongue. 2. Right level 2, 3, 4, 5a/b necrotic lymphadenopathy with findings of extra nodal extension. Left level 2 lymphadenopathy. These results will be called to the ordering clinician or representative by the Radiologist  Assistant, and communication documented in the PACS or zVision Dashboard. Electronically Signed   By: Mitzi Hansen M.D.   On: 11/10/2016 15:22   Dg Chest Port 1 View  Result Date: 11/11/2016 CLINICAL DATA:  NG tube placement. EXAM: PORTABLE CHEST 1 VIEW COMPARISON:  11/10/2016 FINDINGS: An enteric tube has been placed and demonstrates a single loop in the neck before coursing through the chest with tip not visualized on this study (see separate abdominal radiograph for tip and side hole position). A single lead ICD remains in place. The cardiac silhouette remains enlarged. The costophrenic angles were incompletely imaged. No sizable pleural effusion is identified. No confluent airspace opacity, overt pulmonary edema, or pneumothorax is seen. No acute osseous abnormality is identified. IMPRESSION: 1. Enteric tube with single loop in the neck. Tip not imaged on this study, see separate abdominal radiograph report. 2. No evidence of active cardiopulmonary disease. Electronically Signed   By: Sebastian Ache M.D.   On: 11/11/2016 17:09   Dg Abd Acute W/chest  Result Date: 11/10/2016 CLINICAL DATA:  Initial  evaluation for acute nausea, vomiting, abdominal pain status post CT earlier today. EXAM: DG ABDOMEN ACUTE W/ 1V CHEST COMPARISON:  Prior CT from 11/09/2016. FINDINGS: Left-sided single lead transvenous pacemaker/ AICD in place. Moderate cardiomegaly. Mediastinal silhouette within normal limits. Aortic atherosclerosis. No focal infiltrates. Mild elevation of the left hemidiaphragm. Mild subsegmental atelectasis within the bilateral lung bases. No focal infiltrates. Mild central perihilar vascular congestion without overt pulmonary edema. No pleural effusion. No pneumothorax. Scattered gas-filled loops of small bowel present throughout the abdomen, measuring up to 6.3 cm in diameter. These may be more prominent as compared to prior CT, although the direct comparison somewhat difficult. Few scattered air-fluid levels on upright projection. Finding may reflect sequela of a new and/or developing obstructive process. Secreted contrast material within the bladder. Previously noted gallstone noted within the right upper quadrant. No other soft tissue mass or abnormal calcification. No free air. Level degenerative changes noted within the visualized spine. IMPRESSION: 1. Scattered prominent loops of gas-filled small bowel within the abdomen, somewhat more prominent as compared to recent CT from 11/09/2016. Findings are suspicious for possible new and/or developing obstruction. Further evaluation with repeat cross-sectional imaging could be performed as indicated. 2. Mild bibasilar subsegmental atelectasis. No other active cardiopulmonary disease. 3. Cardiomegaly. 4. Aortic atherosclerosis. Electronically Signed   By: Rise Mu M.D.   On: 11/10/2016 23:36   Dg Abd Portable 1v-small Bowel Obstruction Protocol-initial, 8 Hr Delay  Result Date: 11/11/2016 CLINICAL DATA:  Small bowel obstruction EXAM: PORTABLE ABDOMEN - 1 VIEW COMPARISON:  11/11/2016, 11/09/2016. FINDINGS: Generous volume of air throughout small  bowel. Enteric contrast has reached the splenic flexure. Enteric tube is just visible at the top of the image, probably within the stomach. IMPRESSION: Enteric contrast throughout the colon. Moderate gaseous distention of small bowel. Electronically Signed   By: Ellery Plunk M.D.   On: 11/11/2016 21:11   Dg Abd Portable 1 View  Result Date: 11/11/2016 CLINICAL DATA:  Initial evaluation for NG tube placement. EXAM: PORTABLE ABDOMEN - 1 VIEW COMPARISON:  Prior radiograph from 11/10/2016. FINDINGS: NG tube in place, with tip at or just above the GE junction, side hole in the distal esophagus. Advancement by approximately 7-8 cm is recommended. Again, several mildly prominent gas-filled loops of bowel noted within the upper abdomen, better evaluated on prior study. Mild bibasilar subsegmental atelectasis. IMPRESSION: Tip of enteric tube at the level the GE junction, side hole  in the distal esophagus. Advancement by 7-8 cm suggested to insure adequate placement side hole within the stomach. Electronically Signed   By: Rise Mu M.D.   On: 11/11/2016 01:08    2D ECHO:   Disposition and Follow-up: Discharge Instructions    Diet Carb Modified    Complete by:  As directed    Discharge instructions    Complete by:  As directed    Please note PET scan is scheduled on 11/22/16 at [facility] Nuclear Medicine Department. Please arrive at 8:30AM, nothing to eat after midnight on 3/19.   Increase activity slowly    Complete by:  As directed        DISPOSITION: home    DISCHARGE FOLLOW-UP Follow-up Information    WAKEFIELD,MATTHEW, MD. Schedule an appointment as soon as possible for a visit in 3 week(s).   Specialty:  General Surgery Why:  Dr. Doreen Salvage office will be calling you to schedule your follow-up appointment. Contact information: [address] [place] [address] [phone]        Dillard Cannon, MD. Go on 11/24/2016.   Specialty:   Otolaryngology Why:  Please see Dr. Ezzard Standing on March 21st, 2018 at 10:00am for suture removal. Contact information: [address] [place] [address] [phone]        Collin Covey, MD. Go on 12/01/2016.   Specialty:  Family Medicine Why:  Please follow-up with Dr. Caryl Never on March 28th at 9:00am. Arrive atleast 15 minutes early. Contact information: [address] [place] [address] [phone]            Time spent on Discharge: 31 mins   Signed:   RAI,RIPUDEEP M.D. [facility] 11/17/2016, 12:28 PM Pager: [phone]   Addendum for the coding query  I am unable to review any flow sheets after the patient has been discharged. So unable to provide any information about staging of the pressure injury of the skin.     RAI,RIPUDEEP M.D. [facility] 12/01/2016, 9:22 PM  Pager: [phone]

## 2016-11-17 NOTE — Progress Notes (Signed)
Physical Therapy Treatment Patient Details Name: Collin [name] MRN: [id number] DOB: [birth year] Today's Date: 11/17/2016    History of Present Illness Patient is a 76 yo male admitted 11/10/16 with N/V.  Patient with incarcerated hernia with SBO.  In ED developed Afib with RVR.     PMH:  Afib, DM, obesity, NICM, HLD, OSA, ICD, Rt tongue mass scheduled for surgery Tues 11/16/16.    PT Comments    Pt practiced ambulation without AD today and was safe with supervision but did not tolerate as far distance and maintain slower pace than when using RW. Encouraged to use RW for community distances and walking stick within home. Pt agreeable.     Follow Up Recommendations  No PT follow up;Supervision for mobility/OOB     Equipment Recommendations  Rolling walker with 5" wheels    Recommendations for Other Services       Precautions / Restrictions Precautions Precautions: Other (comment) Precaution Comments: watch HR Restrictions Weight Bearing Restrictions: No    Mobility  Bed Mobility               General bed mobility comments: pt inchair  Transfers Overall transfer level: Modified independent Equipment used: Rolling walker (2 wheeled) Transfers: Sit to/from Stand Sit to Stand: Modified independent (Device/Increase time)         General transfer comment: good safety awareness during mobility  Ambulation/Gait Ambulation/Gait assistance: Supervision Ambulation Distance (Feet): 250 Feet Assistive device: None Gait Pattern/deviations: Step-through pattern;Decreased stride length;Wide base of support Gait velocity: decreased Gait velocity interpretation: Below normal speed for age/gender General Gait Details: practiced walking without RW and pt able to do so safely with supervision but pace slower and fatigued more quickly than when using RW. Pt agreeable that it would be helpful to have RW at home for longer distances.    Stairs            Wheelchair  Mobility    Modified Rankin (Stroke Patients Only)       Balance Overall balance assessment: Needs assistance Sitting-balance support: No upper extremity supported Sitting balance-Leahy Scale: Good     Standing balance support: No upper extremity supported;During functional activity Standing balance-Leahy Scale: Fair Standing balance comment: performed dynamic activities at sink with UE support of one hand for safety                    Cognition Arousal/Alertness: Awake/alert Behavior During Therapy: WFL for tasks assessed/performed Overall Cognitive Status: Within Functional Limits for tasks assessed                      Exercises General Exercises - Lower Extremity Hip Flexion/Marching: 10 reps;Standing Heel Raises: 10 reps;Standing Mini-Sqauts: 10 reps;Standing    General Comments        Pertinent Vitals/Pain Pain Assessment: No/denies pain    Home Living                      Prior Function            PT Goals (current goals can now be found in the care plan section) Acute Rehab PT Goals Patient Stated Goal: "To get through all this surgery" PT Goal Formulation: With patient/family Time For Goal Achievement: 11/21/16 Potential to Achieve Goals: Good Progress towards PT goals: Progressing toward goals    Frequency    Min 3X/week      PT Plan Current plan remains appropriate    Co-evaluation  End of Session   Activity Tolerance: Patient tolerated treatment well Patient left: in chair;with call bell/phone within reach Nurse Communication: Mobility status PT Visit Diagnosis: Unsteadiness on feet (R26.81);Other abnormalities of gait and mobility (R26.89);Muscle weakness (generalized) (M62.81)     Time: 1202-1222 PT Time Calculation (min) (ACUTE ONLY): 20 min  Charges:  $Gait Training: 8-22 mins                    G Codes:     Lyanne Co, PT  Acute Rehab Services  [phone]   Lawana Chambers  Yuvraj Pfeifer 11/17/2016, 2:29 PM

## 2016-11-17 NOTE — Progress Notes (Signed)
ANTICOAGULATION CONSULT NOTE - Initial Consult  Pharmacy Consult for coumadin  Indication: atrial fibrillation  Allergies  Allergen Reactions  . Niaspan [Niacin Er] Other (See Comments)    REACTION NOT AN ALLERGY >  FLUSHED, EXPECTED W/NIACIN    Patient Measurements: Height: 6' (182.9 cm) Weight: 270 lb 15.1 oz (122.9 kg) IBW/kg (Calculated) : 77.6  Vital Signs: Temp: 98.6 F (37 C) (03/14 0748) Temp Source: Oral (03/14 0748) BP: 118/85 (03/14 0748) Pulse Rate: 95 (03/14 0748)   Assessment: 76 yo male on Coumadin 5mg  daily exc for 7.5mg  on Mon PTA for Afib. Was on hold for OR. Now s/p tongue biopsy on 3/13 to restart Coumadin. Hgb and plts stable. INR today down to 1.28  Goal of Therapy:  INR 2-3 Monitor platelets by anticoagulation protocol: Yes   Plan:  Give Coumadin 7.5mg  PO x 1 Monitor daily INR, CBC, s/s of bleed  Elenor Quinones, PharmD, [facility] Clinical Pharmacist Pager [phone] 11/17/2016 8:43 AM

## 2016-11-17 NOTE — Consult Note (Signed)
DENTAL CONSULTATION  Date of Consultation:  11/17/2016 Patient Name:   Collin [name] Date of Birth:   1941/02/12 Medical Record Number: [id number]  VITALS: BP 118/85 (BP Location: Right Arm)   Pulse 95   Temp 98.6 F (37 C) (Oral)   Resp 18   Ht 6' (1.829 m)   Wt 270 lb 15.1 oz (122.9 kg)   SpO2 97%   BMI 36.75 kg/m   CHIEF COMPLAINT: Patient referred by Dr. Basilio Cairo and Dr. Ezzard Standing for a dental consultation.  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 Ezzard Standing. Patient with anticipated radiation therapy and possible chemotherapy. Patient now seen as part of a medically necessary pre-chemradiation therapy dental protocol examination.  The patient currently denies having acute toothaches, swellings, or abscesses. Patient has not been seen by a dentist for 5-10 years by his report. "It's been quite a while."  The patient denies having a primary dentist. Patient denies having any partial dentures. Patient denies having dental phobia.  PROBLEM LIST: Patient Active Problem List   Diagnosis Date Noted  . Umbilical hernia without obstruction and without gangrene   . Pressure injury of skin 11/14/2016  . Tongue mass: Per Ct neck 11/10/2016 11/12/2016  . SBO (small bowel obstruction) 11/11/2016  . Encounter for therapeutic drug monitoring 10/25/2013  . Diabetic peripheral neuropathy (HCC) 10/14/2011  . Hypotension 08/20/2011  . Acute renal failure (HCC) 08/20/2011  . OSA (obstructive sleep apnea) 04/15/2011  . ICD-[employer] 03/30/2011  . Chronic systolic CHF (congestive heart failure) (HCC) 03/30/2011  . Atrial fibrillation with RVR (HCC) 12/14/2010  . Nonischemic cardiomyopathy (HCC) 12/14/2010  . Type 2 diabetes mellitus (HCC) 12/14/2010  . Hyperlipidemia 12/14/2010    PMH: Past Medical History:  Diagnosis Date  . Atrial fibrillation (HCC)   .  Cardiomyopathy, nonischemic (HCC) 1998  . Diabetes mellitus   . Heart disease   . Hyperlipidemia   . OSA (obstructive sleep apnea)     PSH: Past Surgical History:  Procedure Laterality Date  . CARDIAC DEFIBRILLATOR PLACEMENT  2006  . DIRECT LARYNGOSCOPY N/A 11/16/2016   Procedure: DIRECT LARYNGOSCOPY AND BIOPSY;  Surgeon: [name], MD;  Location: [facility] OR;  Service: ENT;  Laterality: N/A;  . IMPLANTABLE CARDIOVERTER DEFIBRILLATOR (ICD) GENERATOR CHANGE N/A 05/10/2014   Procedure: ICD GENERATOR CHANGE;  Surgeon: [name], MD;  Location: [facility] CATH LAB;  Service: Cardiovascular;  Laterality: N/A;  . MASS EXCISION Right 11/16/2016   Procedure: EXCISION RIGHT NECK LIPOMA;  Surgeon: [name], MD;  Location: [facility] OR;  Service: ENT;  Laterality: Right;    ALLERGIES: Allergies  Allergen Reactions  . Niaspan [Niacin Er] Other (See Comments)    REACTION NOT AN ALLERGY >  FLUSHED, EXPECTED W/NIACIN    MEDICATIONS: Current Facility-Administered Medications  Medication Dose Route Frequency Provider Last Rate Last Dose  . acetaminophen (TYLENOL) tablet 650 mg  650 mg Oral Q6H PRN Eduard Clos, MD      . bisacodyl (DULCOLAX) EC tablet 5 mg  5 mg Oral Daily PRN Ramiro Harvest V, MD      . carvedilol (COREG) tablet 25 mg  25 mg Oral BID WC Rodolph Bong, MD   25 mg at 11/17/16 0837  . ceFAZolin (ANCEF) IVPB 2g/100 mL premix  2 g Intravenous Q8H [name], MD   2 g at 11/17/16 0839  . chlorhexidine (HIBICLENS)  4 % liquid 4 application  60 mL Topical Once [name], MD      . digoxin Margit Banda) tablet 0.125 mg  0.125 mg Oral Daily Rodolph Bong, MD   0.125 mg at 11/17/16 0837  . diltiazem (CARDIZEM) tablet 90 mg  90 mg Oral Q1200 Ramiro Harvest V, MD      . furosemide (LASIX) tablet 20 mg  20 mg Oral Daily Rodolph Bong, MD   20 mg at 11/17/16 0837  . hydrALAZINE (APRESOLINE) injection 10 mg  10 mg Intravenous Q4H PRN Eduard Clos,  MD      . insulin aspart (novoLOG) injection 0-15 Units  0-15 Units Subcutaneous TID WC Rodolph Bong, MD   2 Units at 11/17/16 [phone]  . MEDLINE mouth rinse  15 mL Mouth Rinse q12n4p Rodolph Bong, MD   15 mL at 11/16/16 1423  . ondansetron (ZOFRAN-ODT) disintegrating tablet 4 mg  4 mg Oral Q8H PRN Rodolph Bong, MD      . pravastatin (PRAVACHOL) tablet 40 mg  40 mg Oral Daily Rodolph Bong, MD   40 mg at 11/17/16 0837  . warfarin (COUMADIN) tablet 7.5 mg  7.5 mg Oral ONCE-1800 Para March [place], RPH      . Warfarin - Pharmacist Dosing Inpatient   Does not apply q1800 Rodolph Bong, MD        LABS: Lab Results  Component Value Date   WBC 5.8 11/17/2016   HGB 13.8 11/17/2016   HCT 42.6 11/17/2016   MCV 90.3 11/17/2016   PLT 214 11/17/2016      Component Value Date/Time   NA 136 11/17/2016 0211   NA 144 09/16/2016 1444   K 4.4 11/17/2016 0211   CL 99 (L) 11/17/2016 0211   CO2 30 11/17/2016 0211   GLUCOSE 159 (H) 11/17/2016 0211   BUN 19 11/17/2016 0211   BUN 16 09/16/2016 1444   CREATININE 0.96 11/17/2016 0211   CALCIUM 9.1 11/17/2016 0211   GFRNONAA >60 11/17/2016 0211   GFRAA >60 11/17/2016 0211   Lab Results  Component Value Date   INR 1.28 11/17/2016   INR 1.38 11/16/2016   INR 1.64 11/15/2016   No results found for: PTT  SOCIAL HISTORY: Social History   Social History  . Marital status: Married    Spouse name: N/A  . Number of children: N/A  . Years of education: N/A   Occupational History  . Not on file.   Social History Main Topics  . Smoking status: Never Smoker  . Smokeless tobacco: Never Used  . Alcohol use No  . Drug use: No  . Sexual activity: Not on file   Other Topics Concern  . Not on file   Social History Narrative  . No narrative on file    FAMILY HISTORY: Family History  Problem Relation Age of Onset  . Heart disease Mother   . Sudden death Mother     under age 78  . Lupus Mother   . Hypertension Father    . Stroke Father     REVIEW OF SYSTEMS: Reviewed With the patient as per history of present illness.  Psych: Patient denies having dental phobia.  DENTAL HISTORY: CHIEF COMPLAINT: Patient referred by Dr. Basilio Cairo and Dr. Ezzard Standing for a dental consultation.  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 Ezzard Standing. Patient with  anticipated radiation therapy and possible chemotherapy. Patient now seen as part of a medically necessary pre-chemradiation therapy dental protocol examination.  The patient currently denies having acute toothaches, swellings, or abscesses. Patient has not been seen by a dentist for 5-10 years by his report. "It's been quite a while."  The patient denies having a primary dentist. Patient denies having any partial dentures. Patient denies having dental phobia.  DENTAL EXAMINATION: GENERAL:The patient is a tall, well-developed, well-nourished male in no acute distress.  HEAD AND NECK: There is right neck lymphadenopathy. Patient denies acute TMJ symptoms. Patient has a bandage on his right neck/shoulder area consistent with lipoma removal.  INTRAORAL EXAM:The patient has normal saliva. Patient has large, multilobular mandibular lingual tori. There is no evidence of oral abscess formation.  DENTITION:Patient is missing tooth numbers 2, 4, 5, 16, 17, 30, and 32. There are retained root segments in the area tooth numbers 14 and 20. PERIODONTAL:Patient has chronic periodontitis with plaque and calculus accumulations, generalized gingival recession, and mandibular anterior incipient tooth mobility. There is incipient to moderate bone loss.  DENTAL CARIES/SUBOPTIMAL RESTORATIONS:Multiple dental caries are noted.  ENDODONTIC: Patient currently denies acute pulpitis symptoms. Patient does have multiple areas of periapical pathology and radiolucency associated with tooth  numbers 14, 19, and 20.  CROWN AND BRIDGE:There are no crown or bridge restorations noted.  PROSTHODONTIC: Patient denies having any partial dentures.  OCCLUSION: Patient has a poor occlusal scheme secondary to multiple missing teeth, multiple diastemas, supra-eruption and drifting of the unopposed teeth into the edentulous areas, and lack of replacement of missing teeth with dental prostheses.  RADIOGRAPHIC INTERPRETATION: An orthopantogram was taken on 11/17/2016. The patient could ideally benefit from additional full series of dental radiographs. There are multiple missing teeth. Diastemas are noted. There are multiple retained root segments. Multiple dental caries are noted. There is supra-eruption and drifting of the unopposed teeth into the edentulous areas. There is incipient to moderate bone loss noted. There are radiopacities of the lower jaw consistent with presence of bilateral mandibular lingual tori. Multiple dental restorations are noted.   ASSESSMENTS: 1. Squamous cell carcinoma the base of tongue 2. Pre-chemoradiation therapy dental protocol examination 3. Chronic apical periodontitis 4. Multiple retained root segments 5. Multiple dental caries.  6. Chronic periodontitis with bone loss 7. Gingival Recession 8. Accretions 9. Multiple missing teeth 10. Multiple diastemas 11. Supra-eruption and drifting of the unopposed teeth into the edentulous areas 12. Poor occlusal scheme and malocclusion 13. Risk for bleeding with invasive dental procedures due to Coumadin therapy 14. Risk for complications up to and including death due to overall cardiovascular and respiratory compromise   PLAN/RECOMMENDATIONS: 1. I discussed the risks, benefits, and complications of various treatment options with the patient in relationship to his medical and dental conditions, anticipated radiation therapy and possible chemotherapy, and chemoradiation therapy side effects to include xerostomia,  radiation caries, trismus, mucositis, taste changes, gum and jawbone changes, and risk for infection and osteoradionecrosis. We discussed various treatment options to include no treatment, multiple extractions with alveoloplasty, pre-prosthetic surgery as indicated, periodontal therapy, dental restorations, root canal therapy, crown and bridge therapy, implant therapy, and replacement of missing teeth as indicated.  ideally, I would like to obtain a full series of dental radiographs as an outpatient and discuss further the plan of care for the patient. I would also like to have the anticipated ports and doses from Dr. Basilio Cairo with radiation oncology at that time. We will consider referral to an oral surgeon as  indicated due to the overall complexity of treatment for this patient.    2. Discussion of findings with medical team and coordination of future medical and dental care as needed.      Charlynne Pander, DDS

## 2016-11-18 ENCOUNTER — Encounter: Payer: Self-pay | Admitting: Radiation Oncology

## 2016-11-19 ENCOUNTER — Telehealth: Payer: Self-pay

## 2016-11-19 NOTE — Telephone Encounter (Signed)
LMTCB

## 2016-11-22 ENCOUNTER — Ambulatory Visit (HOSPITAL_COMMUNITY): Payer: Self-pay | Admitting: Dentistry

## 2016-11-22 ENCOUNTER — Encounter (HOSPITAL_COMMUNITY): Payer: Self-pay | Admitting: Dentistry

## 2016-11-22 ENCOUNTER — Telehealth: Payer: Self-pay | Admitting: *Deleted

## 2016-11-22 ENCOUNTER — Encounter (HOSPITAL_COMMUNITY)
Admission: RE | Admit: 2016-11-22 | Discharge: 2016-11-22 | Disposition: A | Discharge status: Home / Self Care | Payer: Medicare HMO | Source: Ambulatory Visit | Attending: Otolaryngology | Admitting: Otolaryngology

## 2016-11-22 VITALS — BP 143/89 | HR 80 | Temp 97.6°F

## 2016-11-22 DIAGNOSIS — Z9189 Other specified personal risk factors, not elsewhere classified: Secondary | ICD-10-CM

## 2016-11-22 DIAGNOSIS — C01 Malignant neoplasm of base of tongue: Secondary | ICD-10-CM

## 2016-11-22 DIAGNOSIS — K036 Deposits [accretions] on teeth: Secondary | ICD-10-CM

## 2016-11-22 DIAGNOSIS — M2632 Excessive spacing of fully erupted teeth: Secondary | ICD-10-CM

## 2016-11-22 DIAGNOSIS — M270 Developmental disorders of jaws: Secondary | ICD-10-CM

## 2016-11-22 DIAGNOSIS — M264 Malocclusion, unspecified: Secondary | ICD-10-CM

## 2016-11-22 DIAGNOSIS — K083 Retained dental root: Secondary | ICD-10-CM

## 2016-11-22 DIAGNOSIS — K08409 Partial loss of teeth, unspecified cause, unspecified class: Secondary | ICD-10-CM

## 2016-11-22 DIAGNOSIS — Z01818 Encounter for other preprocedural examination: Secondary | ICD-10-CM | POA: Diagnosis not present

## 2016-11-22 DIAGNOSIS — K0530 Chronic periodontitis, unspecified: Secondary | ICD-10-CM

## 2016-11-22 DIAGNOSIS — K045 Chronic apical periodontitis: Secondary | ICD-10-CM

## 2016-11-22 DIAGNOSIS — K001 Supernumerary teeth: Secondary | ICD-10-CM

## 2016-11-22 DIAGNOSIS — I700 Atherosclerosis of aorta: Secondary | ICD-10-CM | POA: Diagnosis not present

## 2016-11-22 DIAGNOSIS — K06020 Generalized gingival recession, unspecified: Secondary | ICD-10-CM

## 2016-11-22 DIAGNOSIS — K029 Dental caries, unspecified: Secondary | ICD-10-CM

## 2016-11-22 LAB — GLUCOSE, CAPILLARY: Glucose-Capillary: 130 mg/dL — ABNORMAL HIGH (ref 65–99)

## 2016-11-22 MED ORDER — FLUDEOXYGLUCOSE F - 18 (FDG) INJECTION
13.6000 | Freq: Once | INTRAVENOUS | Status: AC | PRN
  Administered 2016-11-22: 13.6 via INTRAVENOUS

## 2016-11-22 NOTE — Telephone Encounter (Signed)
D/C 11/17/16 To: home  Spoke with pt and he states that he is doing well. He commented that he was very impressed with the level of care he received while hospitalized. He has no questions or concerns about his discharge instructions or medications. Reviewed all new medications and instructions.   Appt scheduled with Dr. Elease Hashimoto 12/01/16, pt aware.    Transition Care Management Follow-up Telephone Call  How have you been since you were released from the hospital? good   Do you understand why you were in the hospital? yes   Do you understand the discharge instrcutions? yes  Items Reviewed:  Medications reviewed: yes  Allergies reviewed: yes  Dietary changes reviewed: yes  Referrals reviewed: yes   Functional Questionnaire:   Activities of Daily Living (ADLs):   He states they are independent in the following: ambulation, bathing and hygiene, feeding, continence, grooming, toileting and dressing States they require assistance with the following: none   Any transportation issues/concerns?: no   Any patient concerns? no   Confirmed importance and date/time of follow-up visits scheduled: yes   Confirmed with patient if condition begins to worsen call PCP or go to the ER.  Patient was given the Call-a-Nurse line [phone]: yes

## 2016-11-22 NOTE — Progress Notes (Signed)
11/22/2016  Patient:            Collin [name] Date of Birth:  September 07, 1940 MRN:                [id number]   BP (!) 143/89 (BP Location: Left Arm)   Pulse 80   Temp 97.6 F (36.4 C) (Oral)   FLAVIAN MCILWAIN is a 76 year old male diagnosed with squamous cell carcinoma of the base of tongue. Patient with anticipated radiation therapy and possible chemotherapy. The patient was initially seen as part of a pre-chemoradiation therapy dental protocol examination on 11/17/2016 in the hospital.  Patient subsequently scheduled for additional radiographs, examination, and discussion of treatment options as an Outpatient.  Patient Active Problem List   Diagnosis Date Noted  . Umbilical hernia without obstruction and without gangrene   . Pressure injury of skin 11/14/2016  . Tongue mass: Per Ct neck 11/10/2016 11/12/2016  . SBO (small bowel obstruction) 11/11/2016  . Encounter for therapeutic drug monitoring 10/25/2013  . Diabetic peripheral neuropathy (HCC) 10/14/2011  . Hypotension 08/20/2011  . Acute renal failure (HCC) 08/20/2011  . OSA (obstructive sleep apnea) 04/15/2011  . ICD-[employer] 03/30/2011  . Chronic systolic CHF (congestive heart failure) (HCC) 03/30/2011  . Atrial fibrillation with RVR (HCC) 12/14/2010  . Nonischemic cardiomyopathy (HCC) 12/14/2010  . Type 2 diabetes mellitus (HCC) 12/14/2010  . Hyperlipidemia 12/14/2010   Past Medical History:  Diagnosis Date  . Atrial fibrillation (HCC)   . Cardiomyopathy, nonischemic (HCC) 1998  . Diabetes mellitus   . Heart disease   . Hyperlipidemia   . OSA (obstructive sleep apnea)    Past Surgical History:  Procedure Laterality Date  . CARDIAC DEFIBRILLATOR PLACEMENT  2006  . DIRECT LARYNGOSCOPY N/A 11/16/2016   Procedure: DIRECT LARYNGOSCOPY AND BIOPSY;  Surgeon: [name], MD;  Location: [facility] OR;  Service: ENT;  Laterality: N/A;  . IMPLANTABLE CARDIOVERTER DEFIBRILLATOR (ICD) GENERATOR CHANGE N/A 05/10/2014    Procedure: ICD GENERATOR CHANGE;  Surgeon: [name], MD;  Location: [facility] CATH LAB;  Service: Cardiovascular;  Laterality: N/A;  . MASS EXCISION Right 11/16/2016   Procedure: EXCISION RIGHT NECK LIPOMA;  Surgeon: [name], MD;  Location: [facility] OR;  Service: ENT;  Laterality: Right;   Allergies  Allergen Reactions  . Niaspan [Niacin Er] Other (See Comments)    REACTION NOT AN ALLERGY >  FLUSHED, EXPECTED W/NIACIN   Current Outpatient Prescriptions  Medication Sig Dispense Refill  . acetaminophen (TYLENOL) 500 MG tablet Take 1,000 mg by mouth every 6 (six) hours as needed for moderate pain.    . carvedilol (COREG) 25 MG tablet TAKE 1 TABLET TWICE DAILY WITH MEALS 180 tablet 1  . digoxin (LANOXIN) 0.125 MG tablet Take 1 tablet (0.125 mg total) by mouth daily. 90 tablet 3  . diltiazem (CARDIZEM) 90 MG tablet Take 1 tablet (90 mg total) by mouth daily. 30 tablet 3  . furosemide (LASIX) 20 MG tablet Take 1 tablet (20 mg total) by mouth daily. 30 tablet 3  . metFORMIN (GLUCOPHAGE) 500 MG tablet TAKE 1 TABLET TWICE DAILY WITH MEALS 180 tablet 2  . ondansetron (ZOFRAN ODT) 4 MG disintegrating tablet Take 1 tablet (4 mg total) by mouth every 8 (eight) hours as needed for nausea or vomiting. 20 tablet 0  . polyethylene glycol (MIRALAX) packet Take 17 g by mouth daily as needed. Available OTC 30 each 0  . pravastatin (PRAVACHOL) 40 MG tablet TAKE 1 TABLET EVERY DAY 90  tablet 2  . warfarin (COUMADIN) 5 MG tablet Take as directed by anticoagulation clinic. (Patient taking differently: Take 5-7.5 mg by mouth daily. Take 5mg  daily except take 7.5mg  on Monday.) 105 tablet 1  . bisacodyl (BISACODYL) 5 MG EC tablet Take 5 mg by mouth daily as needed for moderate constipation.    . senna-docusate (SENOKOT S) 8.6-50 MG tablet Take 1 tablet by mouth at bedtime. (Patient not taking: Reported on 11/22/2016) 30 tablet 3   No current facility-administered medications for this visit.      Procedures: 1.  Orthopantogram retaken secondary to suboptimal nature of initial orthopantogram in the hospital. 2. Multiple periapical radiographs taken (11 PA's) 3. Dental re-examination in the dental clinic.  DENTAL EXAMINATION: GENERAL:The patient is a tall, well-developed, well-nourished male in no acute distress.  HEAD AND NECK: There is right neck lymphadenopathy, approximately 4 cm. There is Left submandibular lymphadenopathy. Patient denies acute TMJ symptoms.  The patient has maximum interincisal opening of 55 mm. Patient has a bandage on his right neck/shoulder area consistent with lipoma removal.  INTRAORAL EXAM:The patient has normal saliva. Patient has large, multilobular mandibular lingual tori. There is no evidence of oral abscess formation.  DENTITION:Patient is missing tooth numbers 2, 4, 5, 16, 17, 30, and 32. There are retained root segments in the area tooth numbers 14 and 20. Patient has an impacted supernumerary tooth in the area of #1.  PERIODONTAL:Patient has chronic periodontitis with plaque and calculus accumulations, generalized gingival recession, and mandibular anterior incipient tooth mobility. There is incipient to moderate bone loss.  DENTAL CARIES/SUBOPTIMAL RESTORATIONS:Multiple dental caries are noted. Extensive dental caries associated with tooth numbers 10, 12, and 19 with probable pulpal involvement. ENDODONTIC: Patient currently denies acute pulpitis symptoms. Patient does have multiple areas of periapical pathology and radiolucency associated with tooth numbers 14, 19, and 20.  CROWN AND BRIDGE:There are no crown or bridge restorations noted.  PROSTHODONTIC: Patient denies having any partial dentures.  OCCLUSION: Patient has a poor occlusal scheme secondary to multiple missing teeth, multiple diastemas, supra-eruption and drifting of the unopposed teeth into the edentulous areas, and lack of replacement of missing teeth with dental prostheses.  RADIOGRAPHIC  INTERPRETATION: An orthopantogram was retaken on 11/22/2016 and supplemented with 11 periapical radiographs. There are multiple missing teeth. There is an impacted supernumerary tooth in the area of #1. Multiple diastemas are noted. There are multiple retained root segments. Multiple dental caries are noted. There are extensive dental caries on tooth numbers 10, 12, and 19 with probable pulpal involvement. There is supra-eruption and drifting of the unopposed teeth into the edentulous areas. There is incipient to moderate bone loss noted. There are radiopacities of the lower jaw consistent with presence of bilateral mandibular lingual tori. Multiple dental restorations are noted.   ASSESSMENTS: 1. Squamous cell carcinoma of the base of tongue 2. Pre-chemoradiation therapy dental protocol examination 3. Chronic apical periodontitis 4. Multiple retained root segments 5. Multiple dental caries.  6. Bilateral mandibular lingual tori 7. 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 8. Chronic periodontitis with bone loss 9. Gingival Recession 10. Accretions 11. Multiple missing teeth 12. Multiple diastemas 13. Supra-eruption and drifting of the unopposed teeth into the edentulous areas 14. Poor occlusal scheme and malocclusion 15. Risk for bleeding with invasive dental procedures due to Coumadin therapy 16. Risk for complications up to and including death due to overall cardiovascular and respiratory compromise   PLAN/RECOMMENDATIONS: 1. I discussed the risks, benefits, and complications of various treatment  options with the patient in relationship to his medical and dental conditions, anticipated radiation therapy and possible chemotherapy, and chemoradiation therapy side effects to include xerostomia, radiation caries, trismus, mucositis, taste changes, gum and jawbone changes, and risk for infection and osteoradionecrosis. We discussed various treatment options to include no treatment,  multiple extractions with alveoloplasty, pre-prosthetic surgery as indicated, periodontal therapy, dental restorations, root canal therapy, crown and bridge therapy, implant therapy, and replacement of missing teeth as indicated. We also discussed referral to an oral surgeon for the dental extraction procedures.  The patient currently wishes to be referred to an oral surgeon for evaluation for extraction of tooth numbers 1, impacted supernumerary tooth area 1`, 10, 12, 14, 18, 19, 20, and 31 with alveoloplasty and bilateral mandibular lingual tori. Patient has been referred to Dr. Cherly Anderson for consultation tomorrow at 11:30 AM. Patient will need to be evaluated for discontinuation of Coumadin therapy prior to the oral surgery. Dr. Retta Mac will also need to discuss anticipated ports and doses with Dr. Basilio Cairo once they are available. Dr. Basilio Cairo is scheduled to see the patient for initial consultation on Wednesday, 11/24/16.  Patient also agrees to proceed with initial periodontal therapy, and multiple dental restorations with Dental Medicine as well as fabrication of upper and lower fluoride trays and scatter protection devices. Impressions were made today for the fabrication of fluoride trays and scatter protection devices.     2. Discussion of findings with medical team and coordination of future medical and dental care as needed.     Charlynne Pander, DDS

## 2016-11-22 NOTE — Telephone Encounter (Signed)
Oncology Nurse Navigator Documentation  Returned call to patient wife, provided clarification re 3/21 appts.  Gayleen Orem, RN, BSN, [place] Neck Oncology Nurse [place] at [place] [phone]

## 2016-11-22 NOTE — Patient Instructions (Signed)

## 2016-11-24 ENCOUNTER — Ambulatory Visit
Admission: RE | Admit: 2016-11-24 | Discharge: 2016-11-24 | Disposition: A | Discharge status: Home / Self Care | Payer: Medicare HMO | Source: Ambulatory Visit | Attending: Radiation Oncology | Admitting: Radiation Oncology

## 2016-11-24 ENCOUNTER — Ambulatory Visit (HOSPITAL_BASED_OUTPATIENT_CLINIC_OR_DEPARTMENT_OTHER): Payer: Medicare HMO | Admitting: Hematology and Oncology

## 2016-11-24 ENCOUNTER — Encounter: Payer: Self-pay | Admitting: Hematology and Oncology

## 2016-11-24 ENCOUNTER — Encounter: Payer: Self-pay | Admitting: *Deleted

## 2016-11-24 ENCOUNTER — Encounter: Payer: Self-pay | Admitting: Radiation Oncology

## 2016-11-24 VITALS — BP 124/79 | HR 62 | Temp 97.9°F | Ht 72.0 in | Wt 271.4 lb

## 2016-11-24 DIAGNOSIS — R634 Abnormal weight loss: Secondary | ICD-10-CM | POA: Insufficient documentation

## 2016-11-24 DIAGNOSIS — Z8249 Family history of ischemic heart disease and other diseases of the circulatory system: Secondary | ICD-10-CM | POA: Diagnosis not present

## 2016-11-24 DIAGNOSIS — I428 Other cardiomyopathies: Secondary | ICD-10-CM | POA: Diagnosis not present

## 2016-11-24 DIAGNOSIS — Z6836 Body mass index (BMI) 36.0-36.9, adult: Secondary | ICD-10-CM | POA: Diagnosis not present

## 2016-11-24 DIAGNOSIS — E119 Type 2 diabetes mellitus without complications: Secondary | ICD-10-CM | POA: Diagnosis not present

## 2016-11-24 DIAGNOSIS — Z510 Encounter for antineoplastic radiation therapy: Secondary | ICD-10-CM | POA: Insufficient documentation

## 2016-11-24 DIAGNOSIS — K429 Umbilical hernia without obstruction or gangrene: Secondary | ICD-10-CM

## 2016-11-24 DIAGNOSIS — I519 Heart disease, unspecified: Secondary | ICD-10-CM | POA: Diagnosis not present

## 2016-11-24 DIAGNOSIS — C01 Malignant neoplasm of base of tongue: Secondary | ICD-10-CM | POA: Diagnosis not present

## 2016-11-24 DIAGNOSIS — G4733 Obstructive sleep apnea (adult) (pediatric): Secondary | ICD-10-CM | POA: Diagnosis not present

## 2016-11-24 DIAGNOSIS — Z7901 Long term (current) use of anticoagulants: Secondary | ICD-10-CM | POA: Insufficient documentation

## 2016-11-24 DIAGNOSIS — E785 Hyperlipidemia, unspecified: Secondary | ICD-10-CM | POA: Insufficient documentation

## 2016-11-24 DIAGNOSIS — Z823 Family history of stroke: Secondary | ICD-10-CM | POA: Diagnosis not present

## 2016-11-24 DIAGNOSIS — I4891 Unspecified atrial fibrillation: Secondary | ICD-10-CM | POA: Insufficient documentation

## 2016-11-24 DIAGNOSIS — Z95810 Presence of automatic (implantable) cardiac defibrillator: Secondary | ICD-10-CM | POA: Insufficient documentation

## 2016-11-24 DIAGNOSIS — E1142 Type 2 diabetes mellitus with diabetic polyneuropathy: Secondary | ICD-10-CM

## 2016-11-24 DIAGNOSIS — Z79899 Other long term (current) drug therapy: Secondary | ICD-10-CM | POA: Diagnosis not present

## 2016-11-24 DIAGNOSIS — Z7984 Long term (current) use of oral hypoglycemic drugs: Secondary | ICD-10-CM | POA: Insufficient documentation

## 2016-11-24 DIAGNOSIS — C029 Malignant neoplasm of tongue, unspecified: Secondary | ICD-10-CM

## 2016-11-24 HISTORY — DX: Disorder of vein, unspecified: I87.9

## 2016-11-24 HISTORY — DX: Zoster without complications: B02.9

## 2016-11-24 NOTE — Progress Notes (Signed)
Oncology Nurse Navigator Documentation  Met with [name] and his wife during initial consult with Dr. Alvy Bimler.   They verbalized understanding chemotherapy not to be part of treatment plan d/t his health issues.  I informed them of 10:00 appt with Dr. Enrique Sack tomorrow for fitting of scatter guards and fluoride trays.  I encouraged them to call with questions/concerns, they verbalized understanding.  Gayleen Orem, RN, BSN, [place] Neck Oncology Nurse [place] at [place] [phone]

## 2016-11-24 NOTE — Progress Notes (Signed)
Oncology Nurse Navigator Documentation  Met with Mr. Pennella during initial consult with Dr. Isidore Moos.  He was accompanied by his wife.  1. Further introduced myself as his Navigator, explained my role as a member of the Care Team.   2. Provided New Patient Information packet, discussed contents:  Contact information for physician(s), myself, other members of the Care Team.  Advance Directive information ([place] blue pamphlet with LCSW contact info).  Provided [facility] Advance Directives.  Fall Prevention Patient Safety Plan  Appointment Guideline  Financial Assistance Information sheet  [place] with highlight of [place] 3. Provided introductory explanation of radiation treatment including SIM planning and purpose of Aquaplast head and shoulder mask, showed them example.  Also showed example of open-faced mask per his indication of severe claustrophobia. 4. Provided and discussed education handout for PEG.  5. Provided a tour of SIM and [place] areas, explained treatment and arrival procedures. 6. Arranged CT SIM for this Friday with 1:15 IV Start, 2:00 SIM.  They understand I will join them during appt with Dr. Alvy Bimler later this afternoon.  Gayleen Orem, RN, BSN, [facility] Head & Neck Oncology Nurse [place] at [place] [phone]   They verbalized understanding of information provided.

## 2016-11-24 NOTE — Progress Notes (Signed)
Radiation Oncology         [phone] ________________________________  Initial Outpatient Consultation  Name: Collin [name] MRN: [id number]  Date: 11/24/2016  DOB: 1941/08/22  XL:[id number],ZDGUY W, MD  [name], *   REFERRING PHYSICIAN: [name], *  DIAGNOSIS:    ICD-9-CM ICD-10-CM   1. Malignant neoplasm of base of tongue (HCC) 141.0 C01 TSH     Ambulatory referral to Social Work     Ambulatory referral to Physical Therapy     Referral to Neuro Rehab     Amb Referral to Nutrition and Diabetic E     Ambulatory referral to General Surgery     Ambulatory referral to Home Health  2. Tongue cancer (HCC) 141.9 C02.9   3. Loss of weight 783.21 R63.4 TSH  4. 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   CHIEF COMPLAINT: Here to discuss management of tongue cancer.  HISTORY OF PRESENT ILLNESS::Collin [name] is a 76 y.o. male who presented with an enlarging right neck mass over the past several months. Patient reports this mass was drained twice by Dr. Ezzard Standing. The patient reported he scheduled an elective removal of the shoulder lipoma instead of draining the mass for a third time. Patient underwent a CT scan of the neck with contrast on 11/10/16. This showed a right base tongue mass likely representing carcinoma. This mass measured 37 mm; it invaded the base of tongue by 27 mm and extended 9 mm to the contralateral base of tongue. There was also right level 2, 3, 4, 5 a/b necrotic lymphadenopathy with findings of extra nodal extension. Patient was expected to have a biopsy and an elective removal of  shoulder lipoma with Dr. Ezzard Standing on 11/12/16 but this was canceled due to admittance to the ED for small bowel obstruction. Biopsy on 11/16/16 revealed squamous cell carcinoma at the base of tongue, and benign adipose tissue consistent with lipoma at the right neck with no evidence of malignancy. Patient underwent a PET scan on 11/22/16. This showed hypermetabolic base of tongue mass compatible with known neoplasm. 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 Patient was seen by Dr. Lynnell Catalan on 11/22/16 who referred him to Dr. Retta Mac for possible extractions. Dr. Retta Mac recommended a simulation and treatment plan to determine the need for extractions.  Of note, patient presented to the emergency department on 11/10/16 for 8/10 abdominal pain and a hernia that popped out after a strained bowel movement. CT of abdomen and pelvis with contrast showed an umbilical hernia and no evidence of bowel obstruction. Abdominal films on 11/13/16 reflected a partial small bowel obstruction. The patient improved with conservative treatment. He was discharged on 11/17/16.  Patient denies a history of smoking or alcohol. Patient's wife reports social drinking 35-40 years ago. Patient notes a 20 pound weight loss in one month. He denies difficulty swallowing. Patient notes extreme claustrophobia. Patient reports thick sputum since visit to ED. Patient notes shortness of breath with walking. He has experienced several episodes of atrial fibrillation with an ICD in place. Patient is positive for constipation that is managed with Miralax.  PREVIOUS RADIATION THERAPY: No  PAST MEDICAL HISTORY:  has a past medical history of Atrial fibrillation (HCC); Cardiomyopathy, nonischemic (HCC) (1998); Diabetes mellitus; Heart disease; Hyperlipidemia; OSA (obstructive sleep apnea); Shingles; and Vein disorder.    PAST SURGICAL HISTORY: Past  Surgical History:  Procedure Laterality Date  . CARDIAC DEFIBRILLATOR PLACEMENT  2006  . CATARACT EXTRACTION Bilateral    01/07/2016. 01/22/2016  . CYST EXCISION     multiple drainage to a cyst in his neck.   Marland Kitchen DIRECT LARYNGOSCOPY N/A 11/16/2016   Procedure: DIRECT LARYNGOSCOPY AND BIOPSY;  Surgeon: [name], MD;  Location[facility] OR;  Service: ENT;  Laterality: N/A;  . HERNIA REPAIR     1950's  . IMPLANTABLE CARDIOVERTER DEFIBRILLATOR (ICD) GENERATOR CHANGE N/A 05/10/2014   Procedure: ICD GENERATOR CHANGE;  Surgeon: [name], MD;  Location[facility] CATH LAB;  Service: Cardiovascular;  Laterality: N/A;  . MASS EXCISION Right 11/16/2016   Procedure: EXCISION RIGHT NECK LIPOMA;  Surgeon: [name], MD;  Location: [facility] OR;  Service: ENT;  Laterality: Right;  . MOHS SURGERY  01/01/2015   to top of head  . MOUTH SURGERY     teeth removed 4 teeth extracted 02/2010    FAMILY HISTORY: family history includes Heart disease in his mother; Hypertension in his father; Lupus in his mother; Stroke in his father; Sudden death in his mother.  SOCIAL HISTORY:  reports that he has never smoked. He has never used smokeless tobacco. He reports that he drinks alcohol. He reports that he does not use drugs.  ALLERGIES: Niaspan [niacin er]  MEDICATIONS:  Current Outpatient Prescriptions  Medication Sig Dispense Refill  . acetaminophen (TYLENOL) 500 MG tablet Take 1,000 mg by mouth every 6 (six) hours as needed for moderate pain.    . carvedilol (COREG) 25 MG tablet TAKE 1 TABLET TWICE DAILY WITH MEALS 180 tablet 1  . digoxin (LANOXIN) 0.125 MG tablet Take 1 tablet (0.125 mg total) by mouth daily. 90 tablet 3  . diltiazem (CARDIZEM) 90 MG tablet Take 1 tablet (90 mg total) by mouth daily. 30 tablet 3  . furosemide (LASIX) 20 MG tablet Take 1 tablet (20 mg total) by mouth daily. 30 tablet 3  . metFORMIN (GLUCOPHAGE) 500 MG tablet TAKE 1 TABLET TWICE DAILY WITH MEALS 180 tablet 2  . polyethylene  glycol (MIRALAX) packet Take 17 g by mouth daily as needed. Available OTC 30 each 0  . pravastatin (PRAVACHOL) 40 MG tablet TAKE 1 TABLET EVERY DAY 90 tablet 2  . warfarin (COUMADIN) 5 MG tablet Take as directed by anticoagulation clinic. (Patient taking differently: Take 5-7.5 mg by mouth daily. Take 5mg  daily except take 7.5mg  on Monday.) 105 tablet 1  . bisacodyl (BISACODYL) 5 MG EC tablet Take 5 mg by mouth daily as needed for moderate constipation.    . ondansetron (ZOFRAN ODT) 4 MG disintegrating tablet Take 1 tablet (4 mg total) by mouth every 8 (eight) hours as needed for nausea or vomiting. (Patient not taking: Reported on 11/24/2016) 20 tablet 0  . senna-docusate (SENOKOT S) 8.6-50 MG tablet Take 1 tablet by mouth at bedtime. (Patient not taking: Reported on 11/22/2016) 30 tablet 3   No current facility-administered medications for this encounter.     REVIEW OF SYSTEMS:  A 10+ POINT REVIEW OF SYSTEMS WAS OBTAINED including neurology, dermatology, psychiatry, cardiac, respiratory, lymph, extremities, GI, GU, Musculoskeletal, constitutional, HEENT.  All pertinent positives are noted in the HPI.  All others are negative.  PHYSICAL EXAM:  height is 6' (1.829 m) and weight is 271 lb 6.4 oz (123.1 kg). His temperature is 97.9 F (36.6 C). His blood pressure is 124/79 and his pulse is 62. His oxygen saturation is 97%.   General: Alert and oriented, in no acute distress HEENT: Head is normocephalic. Extraocular movements are intact. Oropharynx is clear. No obvious oropharyngeal lesions. Metallic work noted on some of his teeth. Some teeth are missing. Neck: Large mass in level 1B/2 region of the right neck approximately 5 cm in greatest dimension. Level 3 mass in right neck posterior to dominant mass, 3 cm in greatest dimension. Level 2 of the left neck there is at least one mass approximately 2 cm in greatest dimension.  Heart: Heart sounds are distant. Chest: Clear to auscultation bilaterally,  with no rhonchi, wheezes, or rales. Abdomen: Soft, nontender, nondistended, with no rigidity or guarding. Elastic girdle around his abdomen from recent hernia. Extremities:  Swelling in bilateral ankles, more so on the left. "chronic" per wife Lymphatics: see Neck Exam Skin: No concerning lesions. Musculoskeletal: symmetric strength and muscle tone throughout. Neurologic: Cranial nerves II through XII are grossly intact. No obvious focalities. Speech is fluent. Coordination is intact. Psychiatric: Judgment and insight are intact. Affect is appropriate.   ECOG = 2  0 - Asymptomatic (Fully active, able to carry on all predisease activities without restriction)  1 - Symptomatic but completely ambulatory (Restricted in physically strenuous activity but ambulatory and able to carry out work of a light or sedentary nature. For example, light housework, office work)  2 - Symptomatic, <50% in bed during the day (Ambulatory and capable of all self care but unable to carry out any work activities. Up and about more than 50% of waking hours)  3 - Symptomatic, >50% in bed, but not bedbound (Capable of only limited self-care, confined to bed or chair 50% or more of waking hours)  4 - Bedbound (Completely disabled. Cannot carry on any self-care. Totally confined to bed or chair)  5 - Death   Santiago Glad MM, Creech RH, Tormey DC, et al. [phone]). "Toxicity and response criteria of the [facility]". Am. Evlyn Clines. Oncol. 5 (6): 649-55   LABORATORY DATA:  Lab Results  Component Value Date   WBC 5.8 11/17/2016   HGB 13.8 11/17/2016   HCT 42.6 11/17/2016   MCV 90.3 11/17/2016   PLT 214 11/17/2016   CMP     Component Value Date/Time   NA 136 11/17/2016 0211   NA 144 09/16/2016 1444   K 4.4 11/17/2016 0211   CL 99 (L) 11/17/2016 0211   CO2 30 11/17/2016 0211   GLUCOSE 159 (H) 11/17/2016 0211   BUN 19 11/17/2016 0211   BUN 16 09/16/2016 1444   CREATININE 0.96 11/17/2016 0211    CALCIUM 9.1 11/17/2016 0211   PROT 7.0 11/11/2016 0356   ALBUMIN 3.6 11/11/2016 0356   AST 18 11/11/2016 0356   ALT 20 11/11/2016 0356   ALKPHOS 72 11/11/2016 0356   BILITOT 0.9 11/11/2016 0356   GFRNONAA >60 11/17/2016 0211   GFRAA >60 11/17/2016 0211         RADIOGRAPHY: Dg Orthopantogram  Result Date: 11/17/2016 CLINICAL DATA:  Cortication. EXAM: ORTHOPANTOGRAM/PANORAMIC COMPARISON:  CT 11/10/2016. FINDINGS: Limited exam due to patient's body habitus. Periapical lucencies about multiple upper teeth bilaterally cannot be excluded. No evidence of fracture or dislocation. IMPRESSION: Limited exam due to patient's body habitus. Periapical lucencies about multiple upper teeth bilaterally cannot  be excluded. This suggest dental disease. Dental evaluation may prove useful. Electronically Signed   By: Maisie Fus  Register   On: 11/17/2016 11:00   Ct Soft Tissue Neck W Contrast  Result Date: 11/10/2016 CLINICAL DATA:  76 y/o  M; right neck mass. EXAM: CT NECK WITH CONTRAST TECHNIQUE: Multidetector CT imaging of the neck was performed using the standard protocol following the bolus administration of intravenous contrast. CONTRAST:  75mL ISOVUE-300 IOPAMIDOL (ISOVUE-300) INJECTION 61% COMPARISON:  None. FINDINGS: Pharynx and larynx: Right base of tongue mass measuring 36 x 32 x 37 mm (AP x ML x CC series 3, image 37 and series 200, image 56). The mass effaces the right vallecula glossopharyngeal sulcus and inferiorly abuts the hyoid bone. The mass invades the tongue 27 mm and extends approximately 9 mm into the contralateral base of tongue. Salivary glands: No inflammation, mass, or stone. Thyroid: Subcentimeter low hypodense foci probably represent nodules. Lymph nodes: Necrotic lymphadenopathy in the right level 2, 3, 4, and 5a/b stations. Left-sided cervical necrotic lymphadenopathy at the level 2 station. The largest lymph node in the right level 2 station measures 41 x 39 x 49 mm (AP x ML x CC). There is  stranding surrounding the right upper cervical lymph nodes with irregular margins for example (series 200, image 39) and an indistinct border between the right submandibular gland and adjacent adenopathy (series 3, image 47) indicating probable extra nodal extension. Vascular: Patent carotid and vertebral arteries of the neck. Patent internal jugular veins. Moderate calcific atherosclerosis of carotid bifurcations without high-grade stenosis. Brief submucosal retropharyngeal course of proximal ICA bilaterally. Limited intracranial: Negative. Visualized orbits: Not within the field of view. Mastoids and visualized paranasal sinuses: Right greater than left maxillary sinus mucosal thickening. Skeleton: No acute fracture or suspicious osseous lesion. Moderate cervical spondylosis with predominantly discogenic degenerative changes. No high-grade bony canal stenosis. Upper chest: Aortic atherosclerosis with moderate calcification. Other: None. IMPRESSION: 1. 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 The mass measures up to 37 mm. Visible mass invades base of tongue 27 mm and extends 9 mm to contralateral base of tongue. 2. Right level 2, 3, 4, 5a/b necrotic lymphadenopathy with findings of extra nodal extension. Left level 2 lymphadenopathy. These results will be called to the ordering clinician or representative by the Radiologist Assistant, and communication documented in the PACS or zVision Dashboard. Electronically Signed   By: Mitzi Hansen M.D.   On: 11/10/2016 15:22   Ct Abdomen Pelvis W Contrast  Result Date: 11/09/2016 CLINICAL DATA:  Abdominal pain.  Recurrent umbilical hernia. EXAM: CT ABDOMEN AND PELVIS WITH CONTRAST TECHNIQUE: Multidetector CT imaging of the abdomen and pelvis was performed using the standard protocol following bolus administration of intravenous contrast. 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 Hepatobiliary: The liver parenchyma is normal. Single 17 mm calcified gallstone. Gallbladder wall is not thickened. No dilated bile ducts. 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 Pancreas: Unremarkable. No pancreatic ductal dilatation or surrounding inflammatory changes. Spleen: Normal in size without focal abnormality other than a single calcified granuloma. Adrenals/Urinary Tract: Adrenal glands are unremarkable. Kidneys are normal, without renal calculi, focal lesion, or hydronephrosis. Bladder is unremarkable. Stomach/Bowel: Stomach is within normal limits. Appendix  appears normal. No evidence of bowel wall thickening, distention, or inflammatory changes. Vascular/Lymphatic: Extensive aortic atherosclerosis. No adenopathy. 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 Reproductive: Prostate is unremarkable. Other: There is a 7.4 cm umbilical hernia containing only fat. No bowel within the hernia. Musculoskeletal: No acute or significant osseous findings. IMPRESSION: 1. Umbilical hernia containing only peritoneal fat. No bowel obstruction. 2. Chronic cholelithiasis. A gallstone is present on the prior study of 06/10/2014. 3. 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 Electronically Signed   By: Francene Boyers M.D.   On: 11/09/2016 08:10   Nm Pet Image Initial (pi) Skull Base To Thigh  Result Date: 11/22/2016 CLINICAL DATA:  Initial treatment strategy for base of tongue cancer. EXAM: NUCLEAR MEDICINE PET SKULL BASE TO THIGH TECHNIQUE: 13.6 mCi F-18 FDG was injected intravenously. Full-ring PET imaging was performed from the skull base to thigh after the radiotracer. CT data was obtained and used for attenuation correction and anatomic  localization. FASTING BLOOD GLUCOSE:  Value: 130 mg/dl COMPARISON:  None FINDINGS: NECK Mass within the tongue base slightly right of the midline measures 3 cm and has an SUV max equal to 11.5. Solitary left level-II lymph node measures 1.6 cm and has an SUV max equal to 9.7. Hypermetabolic right level 4 node Measures 5.5 cm and has an SUV max equal to 6.85. Hypermetabolic right level 3 node measures 2.7 cm and has an SUV max equal to 10.10. Hypermetabolic right level-II lymph node measures 2.4 cm and has an SUV max equal to 10.5.No hypermetabolic lymph nodes in the neck. CHEST The heart size is moderately enlarged. There is aortic atherosclerosis. 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 Calcifications within the RCA, LAD and left circumflex coronary artery noted. No hypermetabolic supraclavicular, axillary, mediastinal or hilar lymph nodes. ABDOMEN/PELVIS No abnormal hypermetabolic activity within the liver, pancreas, adrenal glands, or spleen. Gallstone identified. Aortic atherosclerosis identified. Ventral abdominal wall hernia is identified at the level of the umbilicus. This contains fat only. No hypermetabolic lymph nodes in the abdomen or pelvis. SKELETON No focal hypermetabolic activity to suggest skeletal metastasis. IMPRESSION: 1. Hypermetabolic base of tongue mass compatible with known neoplasm. 2. 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 3. No evidence for distant metastatic disease 4. Aortic atherosclerosis and coronary artery calcification 5. Gallstones. Electronically Signed   By: Signa Kell M.D.   On: 11/22/2016 12:07   Dg Chest Port 1 View  Result Date: 11/11/2016 CLINICAL DATA:  NG tube placement. EXAM: PORTABLE CHEST 1 VIEW COMPARISON:  11/10/2016 FINDINGS: An enteric tube has been placed and demonstrates a single loop in the neck before coursing through the chest with tip not visualized on this study (see separate  abdominal radiograph for tip and side hole position). A single lead ICD remains in place. The cardiac silhouette remains enlarged. The costophrenic angles were incompletely imaged. No sizable pleural effusion is identified. No confluent airspace opacity, overt pulmonary edema, or pneumothorax is seen. No acute osseous abnormality is identified. IMPRESSION: 1. Enteric tube with single loop in the neck. Tip not imaged on this study, see separate abdominal radiograph report. 2. No evidence of active cardiopulmonary disease. Electronically Signed   By: Sebastian Ache M.D.   On: 11/11/2016 17:09   Dg Abd Acute W/chest  Result Date: 11/10/2016 CLINICAL DATA:  Initial evaluation for acute nausea, vomiting, abdominal pain status post CT earlier today. EXAM: DG ABDOMEN ACUTE W/ 1V CHEST COMPARISON:  Prior  CT from 11/09/2016. FINDINGS: Left-sided single lead transvenous pacemaker/ AICD in place. Moderate cardiomegaly. Mediastinal silhouette within normal limits. Aortic atherosclerosis. No focal infiltrates. Mild elevation of the left hemidiaphragm. Mild subsegmental atelectasis within the bilateral lung bases. No focal infiltrates. Mild central perihilar vascular congestion without overt pulmonary edema. No pleural effusion. No pneumothorax. Scattered gas-filled loops of small bowel present throughout the abdomen, measuring up to 6.3 cm in diameter. These may be more prominent as compared to prior CT, although the direct comparison somewhat difficult. Few scattered air-fluid levels on upright projection. Finding may reflect sequela of a new and/or developing obstructive process. Secreted contrast material within the bladder. Previously noted gallstone noted within the right upper quadrant. No other soft tissue mass or abnormal calcification. No free air. Level degenerative changes noted within the visualized spine. IMPRESSION: 1. Scattered prominent loops of gas-filled small bowel within the abdomen, somewhat more prominent  as compared to recent CT from 11/09/2016. Findings are suspicious for possible new and/or developing obstruction. Further evaluation with repeat cross-sectional imaging could be performed as indicated. 2. Mild bibasilar subsegmental atelectasis. No other active cardiopulmonary disease. 3. Cardiomegaly. 4. Aortic atherosclerosis. Electronically Signed   By: Rise Mu M.D.   On: 11/10/2016 23:36   Dg Abd Portable 1v  Result Date: 11/13/2016 CLINICAL DATA:  Small bowel obstruction. EXAM: PORTABLE ABDOMEN - 1 VIEW COMPARISON:  None. FINDINGS: There is oral contrast material in the colon. There is a nasogastric tube with the tip projecting over the stomach with the proximal port just beyond the gastroesophageal junction. There are mildly dilated loops of small bowel in the left side of the abdomen measuring up to 4 cm. There is no evidence of pneumoperitoneum, portal venous gas or pneumatosis. There are no pathologic calcifications along the expected course of the ureters. The osseous structures are unremarkable. IMPRESSION: 1. Persistent mildly dilated loops of small bowel in the left side of the abdomen measuring up to 4 cm with oral contrast material within the colon. This may reflect a partial small bowel obstruction. No significant interval change compared with 11/11/2016. 2. nasogastric tube with the tip projecting over the stomach with the proximal port just beyond the gastroesophageal junction. Recommend advancing the nasogastric tube 10 cm. Electronically Signed   By: Elige Ko   On: 11/13/2016 08:56   Dg Abd Portable 1v-small Bowel Obstruction Protocol-initial, 8 Hr Delay  Result Date: 11/11/2016 CLINICAL DATA:  Small bowel obstruction EXAM: PORTABLE ABDOMEN - 1 VIEW COMPARISON:  11/11/2016, 11/09/2016. FINDINGS: Generous volume of air throughout small bowel. Enteric contrast has reached the splenic flexure. Enteric tube is just visible at the top of the image, probably within the stomach.  IMPRESSION: Enteric contrast throughout the colon. Moderate gaseous distention of small bowel. Electronically Signed   By: Ellery Plunk M.D.   On: 11/11/2016 21:11   Dg Abd Portable 1 View  Result Date: 11/11/2016 CLINICAL DATA:  Initial evaluation for NG tube placement. EXAM: PORTABLE ABDOMEN - 1 VIEW COMPARISON:  Prior radiograph from 11/10/2016. FINDINGS: NG tube in place, with tip at or just above the GE junction, side hole in the distal esophagus. Advancement by approximately 7-8 cm is recommended. Again, several mildly prominent gas-filled loops of bowel noted within the upper abdomen, better evaluated on prior study. Mild bibasilar subsegmental atelectasis. IMPRESSION: Tip of enteric tube at the level the GE junction, side hole in the distal esophagus. Advancement by 7-8 cm suggested to insure adequate placement side hole within the stomach. Electronically Signed  By: Rise Mu M.D.   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   Patient will be seen by Dr. Bertis Ruddy of oncology later today. Anticipate no chemotherapy per tumor board. Anticipate 7 weeks of radiation therapy. CT simulation and treatment planning will be scheduled for 11/26/16. Order open face mask due to claustrophobia and treatment to take place on a linac. Advised patient to trim his beard prior to his appointment to ensure the mask fits well. Order yankauer suction for thick sputum. Refer patient to swallowing therapist, physical therapist, nutritionist, and social work for survivorship and support during radiotherapy.    Refer patient to Dr. Lindie Spruce for consultation in regards to hernia and possibility of placing a PEG feeding tube to get him through oncologic therapy.  I am not sure if hernia surgery can be delayed - I hope it can to allow urgent radiotherapy for his potentially fatal and aggressive cancer.  It was a pleasure meeting the  patient today. We discussed the risks, benefits, and side effects of radiotherapy. No guarantees of treatment were given. A consent form was signed and placed in the patient's medical record. The patient is enthusiastic about proceeding with treatment. I look forward to participating in the patient's care.  Order TSH - weight loss and risk of hypothyroidism from radiotherapy to neck.  __________________________________________   Lonie Peak, MD  This document serves as a record of services personally performed by Lonie Peak, MD. It was created on her behalf by Tawni Levy, a trained medical scribe. The creation of this record is based on the scribe's personal observations and the provider's statements to them. This document has been checked and approved by the attending provider.

## 2016-11-25 ENCOUNTER — Encounter: Payer: Self-pay | Admitting: Hematology and Oncology

## 2016-11-25 ENCOUNTER — Encounter (HOSPITAL_COMMUNITY): Payer: Self-pay | Admitting: Dentistry

## 2016-11-25 ENCOUNTER — Ambulatory Visit (HOSPITAL_COMMUNITY): Payer: Medicare HMO | Admitting: Dentistry

## 2016-11-25 VITALS — BP 110/72 | HR 74

## 2016-11-25 DIAGNOSIS — Z463 Encounter for fitting and adjustment of dental prosthetic device: Secondary | ICD-10-CM

## 2016-11-25 DIAGNOSIS — Z01818 Encounter for other preprocedural examination: Secondary | ICD-10-CM | POA: Diagnosis not present

## 2016-11-25 DIAGNOSIS — C01 Malignant neoplasm of base of tongue: Secondary | ICD-10-CM | POA: Diagnosis not present

## 2016-11-25 NOTE — Assessment & Plan Note (Signed)
His case was presented at the ENT tumor board. 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 I do not recommend concurrent treatment with systemic chemotherapy. I  explained my role as medical oncologist. I recommend close follow-up while he is receiving radiation treatment for supportive care.

## 2016-11-25 NOTE — Assessment & Plan Note (Signed)
Clinically, he has no signs or symptoms of congestive heart failure apart from bilateral leg edema which is chronic.  He will continue maximum medication as directed by his cardiologist

## 2016-11-25 NOTE — Patient Instructions (Addendum)
FLUORIDE TRAYS PATIENT INSTRUCTIONS    Obtain prescription from the pharmacy.  Don't be surprised if it needs to be ordered.  Be sure to let the pharmacy know when you are close to needing a new refill for them to have it ready for you without interruption of Fluoride use.  The best time to use your Fluoride is before bed time.  You must brush your teeth very well and floss before using the Fluoride in order to get the best use out of the Fluoride treatments.  Place 1 drop of Fluoride gel per tooth in the tray.  Place the tray on your lower teeth and your upper teeth.  Make sure the trays are seated all the way.  Remember, they only fit one way on your teeth.  Insert for 5 full minutes.  At the end of the 5 minutes, take the trays out.  SPIT OUT excess. .  Do NOT rinse your mouth!  Do NOT eat or drink after treatments for at least 30 minutes.  This is why the best time for your treatments is before bedtime.  Clean the inside of your Fluoride trays using COLD WATER and a toothbrush.  In order to keep your Trays from discoloring and free from odors, soak them overnight in denture cleaners such as Efferdent.  Do not use bleach or non denture products.  Store the trays in a safe dry place AWAY from any heat until your next treatment.  If anything happens to your Fluoride trays, or they don't fit as well after any dental work, please let us know as soon as possible. 

## 2016-11-25 NOTE — Progress Notes (Signed)
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  The patient likes to be called Collin [name].  HISTORY OF PRESENTING ILLNESS:  Collin [name] 76 y.o. male is here because of newly diagnosed tongue cancer  According to the patient, the first initial presentation was due to palpable neck lump that has been present for 2-3 years.  He has been seeing Dr. Lucia Gaskins since around April 2016.  He had multiple procedures whereby fluid was drained from the right neck lump. Recently, he developed new lump on the contralateral side of the neck. He subsequently underwent further evaluation as summarized below:   Malignant neoplasm of base of tongue ([place])   11/08/2016 Pathology Results    INSUFFICIENT FOR DIAGNOSIS. The Specimen Is Processed And Examined Microscopically, But Found To Be UNSATISFACTORY For Evaluation. Diagnosis NONDIAGNOSTIC MATERIAL      11/09/2016 Imaging    CT abdomen: Umbilical hernia containing only peritoneal fat. No bowel obstruction. Chronic cholelithiasis. A gallstone is present on the prior study of 06/10/2014. 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      11/10/2016 Imaging    CT neck: Right base of tongue mass likely representing carcinoma. The mass measures up to 37 mm. Visible mass  invades base of tongue 27 mm and extends 9 mm to contralateral base of tongue. 2. Right level 2, 3, 4, 5a/b necrotic lymphadenopathy with findings of extra nodal extension. Left level 2 lymphadenopathy      11/10/2016 - 11/17/2016 Hospital Admission    He was admitted for management of bowel obstruction, managed conservatively      11/16/2016 Pathology Results    1. Tongue, biopsy, Base - SQUAMOUS CELL CARCINOMA. - SEE MICROSCOPIC DESCRIPTION 2. Soft tissue, lipoma, Right Neck - BENIGN ADIPOSE TISSUE CONSISTENT WITH LIPOMA. - NO EVIDENCE OF MALIGNANCY. Microscopic Comment 1. Immunohistochemistry for p16 will be performed and reported as an addendum. Addendum: Immunohistochemistry for p16 (HPV) shows diffuse strong positivity in the tumor.       11/16/2016 Surgery    OPERATION PERFORMED:  Direct laryngoscopy and biopsy of base of tongue mass (frozen section positive for squamous cell carcinoma).  Excision of large right neck lipoma, 10 x 12 cm in size.  SURGEON:  Leonides Sake. Lucia Gaskins, MD.  DESCRIPTION OF PROCEDURE:  Oral cavity was clear.  Base of tongue had a partially exophytic mass extending down to the vallecula.  The epiglottis was uninvolved.  On direct laryngoscopy of the laryngeal surface, epiglottis was clear.  AE folds were clear.  Vocal cords were clear.  Piriform sinuses were clear. Next, biopsies were obtained from the base of tongue and sent for frozen section.  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        11/22/2016 PET scan    Hypermetabolic base of tongue mass compatible with known neoplasm. 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 No evidence for  distant metastatic disease. Aortic atherosclerosis and coronary artery calcification. Gallstones.       he denies any hearing deficit, difficulties with chewing food, swallowing difficulties, painful swallowing, changes in the quality of his voice or  abnormal weight loss. He denies pain from the neck lumps  MEDICAL HISTORY:  Past Medical History:  Diagnosis Date  . Atrial fibrillation ([place])   . Cardiomyopathy, nonischemic ([place]) 1998  . Diabetes mellitus   . Heart disease   . Hyperlipidemia   . OSA (obstructive sleep apnea)   . Shingles   . 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 It sometimes shows as a shadow on scans.     SURGICAL HISTORY: Past Surgical History:  Procedure Laterality Date  . CARDIAC DEFIBRILLATOR PLACEMENT  2006  . CATARACT EXTRACTION Bilateral    01/07/2016. 01/22/2016  . CYST EXCISION     multiple drainage to a cyst in his neck.   Marland Kitchen DIRECT LARYNGOSCOPY N/A 11/16/2016   Procedure: DIRECT LARYNGOSCOPY AND BIOPSY;  Surgeon: Rozetta Nunnery, MD;  Location: [facility];  Service: ENT;  Laterality: N/A;  . HERNIA REPAIR     1950's  . IMPLANTABLE CARDIOVERTER DEFIBRILLATOR (ICD) GENERATOR CHANGE N/A 05/10/2014   Procedure: ICD GENERATOR CHANGE;  Surgeon: [name], MD;  [facility] C[facility] LAB;  Service: Cardiovascular;  Laterality: N/A;  . MASS EXCISION Right 11/16/2016   Procedure: EXCISION RIGHT NECK LIPOMA;  Surgeon: Rozetta Nunnery, MD;  Location: [facility];  Service: ENT;  Laterality: Right;  . MOHS SURGERY  01/01/2015   to top of head  . MOUTH SURGERY     teeth removed 4 teeth extracted 02/2010    SOCIAL HISTORY: Social History   Social History  . Marital status: Married    Spouse name: [name]  . Number of children: 3  . Years of education: N/A   Occupational History  . Retired Animal nutritionist    Social History Main Topics  . Smoking status: Never Smoker  . Smokeless tobacco: Never Used  . Alcohol use Yes     Comment: he has a past history of social drinking  . Drug use: No  . Sexual activity: Not on file   Other Topics Concern  . Not on file   Social History Narrative  . No narrative on file    FAMILY HISTORY: Family History   Problem Relation Age of Onset  . Heart disease Mother   . Sudden death Mother     under age 1  . Lupus Mother   . Hypertension Father   . Stroke Father     ALLERGIES:  is allergic to niaspan [niacin er].  MEDICATIONS:  Current Outpatient Prescriptions  Medication Sig Dispense Refill  . acetaminophen (TYLENOL) 500 MG tablet Take 1,000 mg by mouth every 6 (six) hours as needed for moderate pain.    . carvedilol (COREG) 25 MG tablet TAKE 1 TABLET TWICE DAILY WITH MEALS 180 tablet 1  . digoxin (LANOXIN) 0.125 MG tablet Take 1 tablet (0.125 mg total) by mouth daily. 90 tablet 3  . diltiazem (CARDIZEM) 90 MG tablet Take 1 tablet (90 mg total) by mouth daily. 30 tablet 3  . furosemide (LASIX) 20 MG tablet Take 1 tablet (20 mg total) by mouth daily. 30 tablet 3  . metFORMIN (GLUCOPHAGE) 500 MG tablet TAKE 1 TABLET TWICE DAILY WITH MEALS 180 tablet 2  . polyethylene glycol (MIRALAX) packet  Take 17 g by mouth daily as needed. Available OTC 30 each 0  . pravastatin (PRAVACHOL) 40 MG tablet TAKE 1 TABLET EVERY DAY 90 tablet 2  . warfarin (COUMADIN) 5 MG tablet Take as directed by anticoagulation clinic. (Patient taking differently: Take 5-7.5 mg by mouth daily. Take 5mg  daily except take 7.5mg  on Monday.) 105 tablet 1  . bisacodyl (BISACODYL) 5 MG EC tablet Take 5 mg by mouth daily as needed for moderate constipation.    . ondansetron (ZOFRAN ODT) 4 MG disintegrating tablet Take 1 tablet (4 mg total) by mouth every 8 (eight) hours as needed for nausea or vomiting. (Patient not taking: Reported on 11/24/2016) 20 tablet 0  . senna-docusate (SENOKOT S) 8.6-50 MG tablet Take 1 tablet by mouth at bedtime. (Patient not taking: Reported on 11/22/2016) 30 tablet 3   No current facility-administered medications for this visit.     REVIEW OF SYSTEMS:   Constitutional: Denies fevers, chills or abnormal night sweats Eyes: Denies blurriness of vision, double vision or watery eyes Ears, nose, mouth, throat,  and face: Denies mucositis or sore throat Respiratory: Denies cough, dyspnea or wheezes Cardiovascular: Denies palpitation, chest discomfort  Gastrointestinal:  Denies nausea, heartburn or change in bowel habits Skin: Denies abnormal skin rashes Neurological:Denies numbness, tingling or new weaknesses Behavioral/Psych: Mood is stable, no new changes  All other systems were reviewed with the patient and are negative.  PHYSICAL EXAMINATION: ECOG PERFORMANCE STATUS: 2 - Symptomatic, <50% confined to bed  Vitals:   11/24/16 1412  BP: 111/73  Pulse: 76  Resp: 18  Temp: 98.1 F (36.7 C)   Filed Weights   11/24/16 1412  Weight: 273 lb 6.4 oz (124 kg)    GENERAL:alert, no distress and comfortable.  He is morbidly obese SKIN: skin color, texture, turgor are normal, no rashes or significant lesions EYES: normal, conjunctiva are pink and non-injected, sclera clear OROPHARYNX:no exudate, no erythema and lips, buccal mucosa, and tongue normal  NECK: supple, thyroid normal size, non-tender, without nodularity LYMPH: Noted significant bilateral neck lymphadenopathy LUNGS: clear to auscultation and percussion with normal breathing effort HEART: Irregular rate and rhythm, no murmurs with moderate bilateral lower extremity edema ABDOMEN:abdomen soft, non-tender and normal bowel sounds.  Abdominal binder in situ Musculoskeletal:no cyanosis of digits and no clubbing  PSYCH: alert & oriented x 3 with fluent speech NEURO: no focal motor/sensory deficits  LABORATORY DATA:  I have reviewed the data as listed Lab Results  Component Value Date   WBC 5.8 11/17/2016   HGB 13.8 11/17/2016   HCT 42.6 11/17/2016   MCV 90.3 11/17/2016   PLT 214 11/17/2016   Lab Results  Component Value Date   NA 136 11/17/2016   K 4.4 11/17/2016   CL 99 (L) 11/17/2016   CO2 30 11/17/2016    RADIOGRAPHIC STUDIES: I have personally reviewed the radiological images as listed and agreed with the findings in the  report. Dg Orthopantogram  Result Date: 11/17/2016 CLINICAL DATA:  Cortication. EXAM: ORTHOPANTOGRAM/PANORAMIC COMPARISON:  CT 11/10/2016. FINDINGS: Limited exam due to patient's body habitus. Periapical lucencies about multiple upper teeth bilaterally cannot be excluded. No evidence of fracture or dislocation. IMPRESSION: Limited exam due to patient's body habitus. Periapical lucencies about multiple upper teeth bilaterally cannot be excluded. This suggest dental disease. Dental evaluation may prove useful. Electronically Signed   By: Marcello Moores  Register   On: 11/17/2016 11:00   Ct Soft Tissue Neck W Contrast  Result Date: 11/10/2016 CLINICAL DATA:  76 y/o  M; right neck mass. EXAM: CT NECK WITH CONTRAST TECHNIQUE: Multidetector CT imaging of the neck was performed using the standard protocol following the bolus administration of intravenous contrast. CONTRAST:  49mL ISOVUE-300 IOPAMIDOL (ISOVUE-300) INJECTION 61% COMPARISON:  None. FINDINGS: Pharynx and larynx: Right base of tongue mass measuring 36 x 32 x 37 mm (AP x ML x CC series 3, image 37 and series 200, image 56). The mass effaces the right vallecula glossopharyngeal sulcus and inferiorly abuts the hyoid bone. The mass invades the tongue 27 mm and extends approximately 9 mm into the contralateral base of tongue. Salivary glands: No inflammation, mass, or stone. Thyroid: Subcentimeter low hypodense foci probably represent nodules. Lymph nodes: Necrotic lymphadenopathy in the right level 2, 3, 4, and 5a/b stations. Left-sided cervical necrotic lymphadenopathy at the level 2 station. The largest lymph node in the right level 2 station measures 41 x 39 x 49 mm (AP x ML x CC). There is stranding surrounding the right upper cervical lymph nodes with irregular margins for example (series 200, image 39) and an indistinct border between the right submandibular gland and adjacent adenopathy (series 3, image 47) indicating probable extra nodal extension. Vascular:  Patent carotid and vertebral arteries of the neck. Patent internal jugular veins. Moderate calcific atherosclerosis of carotid bifurcations without high-grade stenosis. Brief submucosal retropharyngeal course of proximal ICA bilaterally. Limited intracranial: Negative. Visualized orbits: Not within the field of view. Mastoids and visualized paranasal sinuses: Right greater than left maxillary sinus mucosal thickening. Skeleton: No acute fracture or suspicious osseous lesion. Moderate cervical spondylosis with predominantly discogenic degenerative changes. No high-grade bony canal stenosis. Upper chest: Aortic atherosclerosis with moderate calcification. Other: None. IMPRESSION: 1. 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 The mass measures up to 37 mm. Visible mass invades base of tongue 27 mm and extends 9 mm to contralateral base of tongue. 2. Right level 2, 3, 4, 5a/b necrotic lymphadenopathy with findings of extra nodal extension. Left level 2 lymphadenopathy. These results will be called to the ordering clinician or representative by the Radiologist Assistant, and communication documented in the PACS or zVision Dashboard. Electronically Signed   By: Kristine Garbe M.D.   On: 11/10/2016 15:22   Ct Abdomen Pelvis W Contrast  Result Date: 11/09/2016 CLINICAL DATA:  Abdominal pain.  Recurrent umbilical hernia. EXAM: CT ABDOMEN AND PELVIS WITH CONTRAST TECHNIQUE: Multidetector CT imaging of the abdomen and pelvis was performed using the standard protocol following bolus administration of intravenous contrast. 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 Hepatobiliary: The liver parenchyma is normal.  Single 17 mm calcified gallstone. Gallbladder wall is not thickened. No dilated bile ducts. 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 Pancreas: Unremarkable. No pancreatic ductal dilatation or surrounding inflammatory changes. Spleen: Normal in size without focal abnormality other than a single calcified granuloma. Adrenals/Urinary Tract: Adrenal glands are unremarkable. Kidneys are normal, without renal calculi, focal lesion, or hydronephrosis. Bladder is unremarkable. Stomach/Bowel: Stomach is within normal limits. Appendix appears normal. No evidence of bowel wall thickening, distention, or inflammatory changes. Vascular/Lymphatic: Extensive aortic atherosclerosis. No adenopathy. 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 Reproductive: Prostate is unremarkable. Other: There is a 7.4 cm umbilical hernia containing only fat. No  bowel within the hernia. Musculoskeletal: No acute or significant osseous findings. IMPRESSION: 1. Umbilical hernia containing only peritoneal fat. No bowel obstruction. 2. Chronic cholelithiasis. A gallstone is present on the prior study of 06/10/2014. 3. 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 Electronically Signed   By: Lorriane Shire M.D.   On: 11/09/2016 08:10   Nm Pet Image Initial (pi) Skull Base To Thigh  Result Date: 11/22/2016 CLINICAL DATA:  Initial treatment strategy for base of tongue cancer. EXAM: NUCLEAR MEDICINE PET SKULL BASE TO THIGH TECHNIQUE: 13.6 mCi F-18 FDG was injected intravenously. Full-ring PET imaging was performed from the skull base to thigh after the radiotracer. CT data was obtained and used for attenuation correction and anatomic localization. FASTING BLOOD GLUCOSE:  Value: 130 mg/dl COMPARISON:  None FINDINGS: NECK Mass within the tongue base slightly right of the midline measures 3 cm and has an SUV max equal to 11.5. Solitary left level-II lymph node measures 1.6 cm and has an SUV max equal to  9.7. Hypermetabolic right level 4 node Measures 5.5 cm and has an SUV max equal to 6.85. Hypermetabolic right level 3 node measures 2.7 cm and has an SUV max equal to 10.10. Hypermetabolic right level-II lymph node measures 2.4 cm and has an SUV max equal to 10.5.No hypermetabolic lymph nodes in the neck. CHEST The heart size is moderately enlarged. There is aortic atherosclerosis. 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 Calcifications within the RCA, LAD and left circumflex coronary artery noted. No hypermetabolic supraclavicular, axillary, mediastinal or hilar lymph nodes. ABDOMEN/PELVIS No abnormal hypermetabolic activity within the liver, pancreas, adrenal glands, or spleen. Gallstone identified. Aortic atherosclerosis identified. Ventral abdominal wall hernia is identified at the level of the umbilicus. This contains fat only. No hypermetabolic lymph nodes in the abdomen or pelvis. SKELETON No focal hypermetabolic activity to suggest skeletal metastasis. IMPRESSION: 1. Hypermetabolic base of tongue mass compatible with known neoplasm. 2. 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 3. No evidence for distant metastatic disease 4. Aortic atherosclerosis and coronary artery calcification 5. Gallstones. Electronically Signed   By: Kerby Moors M.D.   On: 11/22/2016 12:07   Dg Chest Port 1 View  Result Date: 11/11/2016 CLINICAL DATA:  NG tube placement. EXAM: PORTABLE CHEST 1 VIEW COMPARISON:  11/10/2016 FINDINGS: An enteric tube has been placed and demonstrates a single loop in the neck before coursing through the chest with tip not visualized on this study (see separate abdominal radiograph for tip and side hole position). A single lead ICD remains in place. The cardiac silhouette remains enlarged. The costophrenic angles were incompletely imaged. No sizable pleural effusion is identified. No confluent airspace opacity, overt pulmonary  edema, or pneumothorax is seen. No acute osseous abnormality is identified. IMPRESSION: 1. Enteric tube with single loop in the neck. Tip not imaged on this study, see separate abdominal radiograph report. 2. No evidence of active cardiopulmonary disease. Electronically Signed   By: Logan Bores M.D.   On: 11/11/2016 17:09   Dg Abd Acute W/chest  Result Date: 11/10/2016 CLINICAL DATA:  Initial evaluation for acute nausea, vomiting, abdominal pain status post CT earlier today. EXAM: DG ABDOMEN ACUTE W/ 1V CHEST COMPARISON:  Prior CT from 11/09/2016. FINDINGS: Left-sided single lead transvenous pacemaker/ AICD in place. Moderate cardiomegaly. Mediastinal silhouette within normal limits. Aortic atherosclerosis. No focal infiltrates. Mild elevation of the left hemidiaphragm. Mild subsegmental atelectasis within the bilateral lung bases. No focal infiltrates. Mild  central perihilar vascular congestion without overt pulmonary edema. No pleural effusion. No pneumothorax. Scattered gas-filled loops of small bowel present throughout the abdomen, measuring up to 6.3 cm in diameter. These may be more prominent as compared to prior CT, although the direct comparison somewhat difficult. Few scattered air-fluid levels on upright projection. Finding may reflect sequela of a new and/or developing obstructive process. Secreted contrast material within the bladder. Previously noted gallstone noted within the right upper quadrant. No other soft tissue mass or abnormal calcification. No free air. Level degenerative changes noted within the visualized spine. IMPRESSION: 1. Scattered prominent loops of gas-filled small bowel within the abdomen, somewhat more prominent as compared to recent CT from 11/09/2016. Findings are suspicious for possible new and/or developing obstruction. Further evaluation with repeat cross-sectional imaging could be performed as indicated. 2. Mild bibasilar subsegmental atelectasis. No other active  cardiopulmonary disease. 3. Cardiomegaly. 4. Aortic atherosclerosis. Electronically Signed   By: Jeannine Boga M.D.   On: 11/10/2016 23:36   Dg Abd Portable 1v  Result Date: 11/13/2016 CLINICAL DATA:  Small bowel obstruction. EXAM: PORTABLE ABDOMEN - 1 VIEW COMPARISON:  None. FINDINGS: There is oral contrast material in the colon. There is a nasogastric tube with the tip projecting over the stomach with the proximal port just beyond the gastroesophageal junction. There are mildly dilated loops of small bowel in the left side of the abdomen measuring up to 4 cm. There is no evidence of pneumoperitoneum, portal venous gas or pneumatosis. There are no pathologic calcifications along the expected course of the ureters. The osseous structures are unremarkable. IMPRESSION: 1. Persistent mildly dilated loops of small bowel in the left side of the abdomen measuring up to 4 cm with oral contrast material within the colon. This may reflect a partial small bowel obstruction. No significant interval change compared with 11/11/2016. 2. nasogastric tube with the tip projecting over the stomach with the proximal port just beyond the gastroesophageal junction. Recommend advancing the nasogastric tube 10 cm. Electronically Signed   By: Kathreen Devoid   On: 11/13/2016 08:56   Dg Abd Portable 1v-small Bowel Obstruction Protocol-initial, 8 Hr Delay  Result Date: 11/11/2016 CLINICAL DATA:  Small bowel obstruction EXAM: PORTABLE ABDOMEN - 1 VIEW COMPARISON:  11/11/2016, 11/09/2016. FINDINGS: Generous volume of air throughout small bowel. Enteric contrast has reached the splenic flexure. Enteric tube is just visible at the top of the image, probably within the stomach. IMPRESSION: Enteric contrast throughout the colon. Moderate gaseous distention of small bowel. Electronically Signed   By: Andreas Newport M.D.   On: 11/11/2016 21:11   Dg Abd Portable 1 View  Result Date: 11/11/2016 CLINICAL DATA:  Initial evaluation for  NG tube placement. EXAM: PORTABLE ABDOMEN - 1 VIEW COMPARISON:  Prior radiograph from 11/10/2016. FINDINGS: NG tube in place, with tip at or just above the GE junction, side hole in the distal esophagus. Advancement by approximately 7-8 cm is recommended. Again, several mildly prominent gas-filled loops of bowel noted within the upper abdomen, better evaluated on prior study. Mild bibasilar subsegmental atelectasis. IMPRESSION: Tip of enteric tube at the level the GE junction, side hole in the distal esophagus. Advancement by 7-8 cm suggested to insure adequate placement side hole within the stomach. Electronically Signed   By: Jeannine Boga M.D.   On: 11/11/2016 01:08    ASSESSMENT:  Newly diagnosed squamous cell carcinoma of the Head & Neck, HPV Positive  PLAN:  Malignant neoplasm of base of tongue ([place]) His case was  presented at the ENT tumor board. 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 I do not recommend concurrent treatment with systemic chemotherapy. I  explained my role as medical oncologist. I recommend close follow-up while he is receiving radiation treatment for supportive care.   Atrial fibrillation with RVR ([place]) Today, he appears to be rate control. He will continue his cardiac medications and anticoagulation as directed by his cardiologist.  Nonischemic cardiomyopathy ([place]) Clinically, he has no signs or symptoms of congestive heart failure apart from bilateral leg edema which is chronic.  He will continue maximum medication as directed by his cardiologist  Diabetic peripheral neuropathy ([place]) The patient have type 2 diabetes with significant peripheral neuropathy. He will continue medical management  Umbilical hernia without obstruction and without gangrene He had recent small bowel obstruction managed conservatively. The plan is to defer surgery until he has completed primary  treatment for his cancer    All questions were answered. The patient knows to call the clinic with any problems, questions or concerns. I spent 55 minutes counseling the patient face to face. The total time spent in the appointment was 60 minutes and more than 50% was on counseling.     Heath Lark, MD 11/25/16 3:29 PM

## 2016-11-25 NOTE — Progress Notes (Signed)
11/25/2016  Patient Name:   Collin [name] Date of Birth:   [birth year] Medical Record Number: [id number]  BP 110/72   Pulse 74   Collin [name] now presents for insertion of upper and lower scatter protection devices.  PROCEDURE: Appliances were tried in and adjusted as needed. [place] Trismus device was previously fabricated at 50 mm using 30 sticks. Postop instructions were provided and a written and verbal format concerning the use and care of appliances. All questions were answered. Patient to return to clinic for periodontal therapy and dental restorations as indicated. Patient to call if questions or problems arise before then.  Lenn Cal, DDS

## 2016-11-25 NOTE — Assessment & Plan Note (Signed)
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 He will continue medical management

## 2016-11-25 NOTE — Assessment & Plan Note (Signed)
He had recent small bowel obstruction managed conservatively. The plan is to defer surgery until he has completed primary treatment for his cancer

## 2016-11-25 NOTE — Assessment & Plan Note (Signed)
Today, he appears to be rate control. He will continue his cardiac medications and anticoagulation as directed by his cardiologist.

## 2016-11-26 ENCOUNTER — Encounter: Payer: Self-pay | Admitting: *Deleted

## 2016-11-26 ENCOUNTER — Other Ambulatory Visit: Payer: Self-pay | Admitting: Family Medicine

## 2016-11-26 ENCOUNTER — Ambulatory Visit
Admission: RE | Admit: 2016-11-26 | Discharge: 2016-11-26 | Disposition: A | Discharge status: Home / Self Care | Payer: Medicare HMO | Source: Ambulatory Visit | Attending: Radiation Oncology | Admitting: Radiation Oncology

## 2016-11-26 DIAGNOSIS — I4891 Unspecified atrial fibrillation: Secondary | ICD-10-CM | POA: Diagnosis not present

## 2016-11-26 DIAGNOSIS — Z7901 Long term (current) use of anticoagulants: Secondary | ICD-10-CM | POA: Diagnosis not present

## 2016-11-26 DIAGNOSIS — C01 Malignant neoplasm of base of tongue: Secondary | ICD-10-CM | POA: Diagnosis not present

## 2016-11-26 DIAGNOSIS — R634 Abnormal weight loss: Secondary | ICD-10-CM | POA: Diagnosis not present

## 2016-11-26 DIAGNOSIS — I428 Other cardiomyopathies: Secondary | ICD-10-CM | POA: Diagnosis not present

## 2016-11-26 DIAGNOSIS — E119 Type 2 diabetes mellitus without complications: Secondary | ICD-10-CM | POA: Diagnosis not present

## 2016-11-26 DIAGNOSIS — Z79899 Other long term (current) drug therapy: Secondary | ICD-10-CM | POA: Diagnosis not present

## 2016-11-26 DIAGNOSIS — Z95810 Presence of automatic (implantable) cardiac defibrillator: Secondary | ICD-10-CM | POA: Diagnosis not present

## 2016-11-26 DIAGNOSIS — Z510 Encounter for antineoplastic radiation therapy: Secondary | ICD-10-CM | POA: Diagnosis not present

## 2016-11-26 NOTE — Progress Notes (Signed)
Head and Neck Cancer Simulation, IMRT treatment planning note   Outpatient  Diagnosis:    ICD-9-CM ICD-10-CM   1. 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 An Aquaplast head and shoulder mask was custom fitted to the patient's anatomy. High-resolution CT axial imaging was obtained of the head and neck with contrast. I verified that the quality of the imaging is good for treatment planning. 1 Medically Necessary Treatment Device was fabricated and supervised by me: Aquaplast mask.   Treatment planning note I plan to treat the patient with IMRT. I plan to treat the patient's tumor and bilateral neck nodes. I plan to treat to a total dose of 70 Gray in 35  fractions. Dose calculation was ordered from dosimetry.  IMRT planning Note  IMRT is medically necessary and an important modality to deliver adequate dose to the patient's at risk tissues while sparing the patient's normal structures, including the: esophagus, parotid tissue, mandible, brain stem, spinal cord, oral cavity, brachial plexus.  This justifies the use of IMRT in the patient's treatment.    -----------------------------------  Eppie Gibson, MD

## 2016-11-26 NOTE — Progress Notes (Signed)
Oncology Nurse Navigator Documentation  To provide support, encouragement and care continuity, met with [name] for his CT [facility].  He was accompanied by his wife. As planned, he was fitted with open-faced mask d/t severe claustrophobia. He tolerated procedure without difficulty, denied concerns. I showed them LINAC 2 treatment area, explained registration, arrival, and preparation procedures. They understand start-of-treatment will depend on healing from dental extractions to be scheduled. They understand to call me with needs/concerns.  Gayleen Orem, RN, BSN, [place] Neck Oncology Nurse [place] at [place] [phone]

## 2016-11-29 ENCOUNTER — Telehealth: Payer: Self-pay | Admitting: *Deleted

## 2016-11-29 NOTE — Telephone Encounter (Signed)
Oncology Nurse Navigator Documentation  In follow-up to wife's call this morning and her indication she has not heard from surgical office re appt, I contacted [facility], determined Mr. Rinehart has 3/28 appt with Dr. Hulen Skains.  I notified wife, encouraged her to call for appt specifics.  She voiced understanding.  Gayleen Orem, RN, BSN, [place] Neck Oncology Nurse [place] at [place] [phone]

## 2016-11-30 ENCOUNTER — Encounter: Payer: Self-pay | Admitting: Family Medicine

## 2016-11-30 ENCOUNTER — Ambulatory Visit (INDEPENDENT_AMBULATORY_CARE_PROVIDER_SITE_OTHER): Payer: Medicare HMO | Admitting: Family Medicine

## 2016-11-30 ENCOUNTER — Ambulatory Visit: Payer: Medicare HMO | Admitting: Radiation Oncology

## 2016-11-30 VITALS — BP 102/78 | HR 120 | Temp 98.0°F | Wt 268.5 lb

## 2016-11-30 DIAGNOSIS — I4891 Unspecified atrial fibrillation: Secondary | ICD-10-CM

## 2016-11-30 DIAGNOSIS — C01 Malignant neoplasm of base of tongue: Secondary | ICD-10-CM | POA: Diagnosis not present

## 2016-11-30 DIAGNOSIS — I5022 Chronic systolic (congestive) heart failure: Secondary | ICD-10-CM

## 2016-11-30 DIAGNOSIS — K56609 Unspecified intestinal obstruction, unspecified as to partial versus complete obstruction: Secondary | ICD-10-CM

## 2016-11-30 DIAGNOSIS — E118 Type 2 diabetes mellitus with unspecified complications: Secondary | ICD-10-CM

## 2016-11-30 DIAGNOSIS — K429 Umbilical hernia without obstruction or gangrene: Secondary | ICD-10-CM | POA: Diagnosis not present

## 2016-11-30 NOTE — Progress Notes (Signed)
Pre visit review using our clinic review tool, if applicable. No additional management support is needed unless otherwise documented below in the visit note. 

## 2016-11-30 NOTE — Progress Notes (Signed)
Subjective:     Patient ID: Collin [name], male   DOB: 1941-07-03, 76 y.o.   MRN: [id number]  HPI Patient seen for hospital follow-up. He has chronic problems including history of obesity, type 2 diabetes, obstructive sleep apnea, nonischemic cardiomyopathy, dyslipidemia, and atrial fibrillation. He has history of right neck lipoma.  Went to see ENT and they noted a mass at the base of his right side of tongue. CT scan confirmed right base of tongue mass felt to represent carcinoma and also some lymphadenopathy on the right side. PET scan confirmed hypermetabolic nodes in the anterior neck. Patient has seen radiation oncology and also dental and plans to get some teeth extractions followed by radiation therapy. 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  Patient was in the process of being worked up for this tongue mass when he developed some nausea and vomiting and abdominal distention and was diagnosed with small bowel obstruction. This improved with NG tube and supportive care. He has not had any prior history of small bowel structure. Has done well since discharge.  Atrial fibrillation with rapid ventricular response initially treated with IV Cardizem and then transitioned to oral medications. He is now on carvedilol, low-dose Lanoxin, and diltiazem. No chest pain. No dizziness. No palpitations.  Type 2 diabetes with history of good control. Recent A1c 6.7%. He has hyperlipidemia on statin.  Past Medical History:  Diagnosis Date  . Atrial fibrillation (HCC)   . Cardiomyopathy, nonischemic (HCC) 1998  . Diabetes mellitus   . Heart disease   . Hyperlipidemia   . OSA (obstructive sleep apnea)   . Shingles   . 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 It sometimes shows as a shadow on scans.    Past Surgical History:  Procedure Laterality Date  .  CARDIAC DEFIBRILLATOR PLACEMENT  2006  . CATARACT EXTRACTION Bilateral    01/07/2016. 01/22/2016  . CYST EXCISION     multiple drainage to a cyst in his neck.   Marland Kitchen DIRECT LARYNGOSCOPY N/A 11/16/2016   Procedure: DIRECT LARYNGOSCOPY AND BIOPSY;  Surgeon: [name], MD;  Location: [facility] OR;  Service: ENT;  Laterality: N/A;  . HERNIA REPAIR     1950's  . IMPLANTABLE CARDIOVERTER DEFIBRILLATOR (ICD) GENERATOR CHANGE N/A 05/10/2014   Procedure: ICD GENERATOR CHANGE;  Surgeon: [name], MD;  Location[facility] C[facility] LAB;  Service: Cardiovascular;  Laterality: N/A;  . MASS EXCISION Right 11/16/2016   Procedure: EXCISION RIGHT NECK LIPOMA;  Surgeon: [name], MD;  Location: [facility] OR;  Service: ENT;  Laterality: Right;  . MOHS SURGERY  01/01/2015   to top of head  . MOUTH SURGERY     teeth removed 4 teeth extracted 02/2010    reports that he has never smoked. He has never used smokeless tobacco. He reports that he drinks alcohol. He reports that he does not use drugs. family history includes Heart disease in his mother; Hypertension in his father; Lupus in his mother; Stroke in his father; Sudden death in his mother. Allergies  Allergen Reactions  . Niaspan [Niacin Er] Other (See Comments)    REACTION NOT AN ALLERGY >  FLUSHED, EXPECTED W/NIACIN       Review of Systems  Constitutional: Negative for chills, fatigue, fever and unexpected weight change.  HENT: Negative for trouble swallowing.  Eyes: Negative for visual disturbance.  Respiratory: Negative for cough, chest tightness and shortness of breath.   Cardiovascular: Negative for chest pain, palpitations and leg swelling.  Gastrointestinal: Negative for abdominal pain, nausea and vomiting.  Genitourinary: Negative for dysuria.  Neurological: Negative for dizziness, syncope, weakness, light-headedness and headaches.       Objective:   Physical Exam  Constitutional: He is oriented to person, place, and time. He appears  well-developed and well-nourished.  HENT:  Right Ear: External ear normal.  Left Ear: External ear normal.  Mouth/Throat: Oropharynx is clear and moist.  Eyes: Pupils are equal, round, and reactive to light.  Neck: Neck supple. No thyromegaly present.  Cardiovascular: Normal rate.   Regular rhythm consistent with his atrial fibrillation  Pulmonary/Chest: Effort normal and breath sounds normal. No respiratory distress. He has no wheezes. He has no rales.  Abdominal: Soft. He exhibits no distension. There is no tenderness. There is no rebound and no guarding.  Umbilical hernia which is soft and nontender  Musculoskeletal: He exhibits no edema.  Neurological: He is alert and oriented to person, place, and time.  Psychiatric: He has a normal mood and affect. His behavior is normal.       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 Currently rate controlled back on anticoagulation  #4 type 2 diabetes with history of good control  #5 hyperlipidemia    Plan:     -Continue close follow-up with radiation oncology as scheduled -Continue current medications -Continue to get INR monitored closely through Coumadin clinic -We'll plan routine follow-up in 2 months and repeat hemoglobin A1c then.  [name] MD [facility] at [facility]

## 2016-11-30 NOTE — Patient Instructions (Signed)
Small Bowel Obstruction °A small bowel obstruction is a blockage in the small bowel. The small bowel, which is also called the small intestine, is a long, slender tube that connects the stomach to the colon. When a person eats and drinks, food and fluids go from the stomach to the small bowel. This is where most of the nutrients in the food and fluids are absorbed. °A small bowel obstruction will prevent food and fluids from passing through the small bowel as they normally do during digestion. The small bowel can become partially or completely blocked. This can cause symptoms such as abdominal pain, vomiting, and bloating. If this condition is not treated, it can be dangerous because the small bowel could rupture. °What are the causes? °Common causes of this condition include: °· Scar tissue from previous surgery or radiation treatment. °· Recent surgery. This may cause the movements of the bowel to slow down and cause food to block the intestine. °· Hernias. °· Inflammatory bowel disease (colitis). °· Twisting of the bowel (volvulus). °· Tumors. °· A foreign body. °· Slipping of a part of the bowel into another part (intussusception). °What are the signs or symptoms? °Symptoms of this condition include: °· Abdominal pain. This may be dull cramps or sharp pain. It may occur in one area, or it may be present in the entire abdomen. Pain can range from mild to severe, depending on the degree of obstruction. °· Nausea and vomiting. Vomit may be greenish or a yellow bile color. °· Abdominal bloating. °· Constipation. °· Lack of passing gas. °· Frequent belching. °· Diarrhea. This may occur if the obstruction is partial and runny stool is able to leak around the obstruction. °How is this diagnosed? °This condition may be diagnosed based on a physical exam, medical history, and X-rays of the abdomen. You may also have other tests, such as a CT scan of the abdomen and pelvis. °How is this treated? °Treatment for this  condition depends on the cause and severity of the problem. Treatment options may include: °· Bed rest along with fluids and pain medicines that are given through an IV tube inserted into one of your veins. Sometimes, this is all that is needed for the obstruction to improve. °· Following a simple diet. In some cases, a clear liquid diet may be required for several days. This allows the bowel to rest. °· Placement of a small tube (nasogastric tube) into the stomach. When the bowel is blocked, it usually swells up like a balloon that is filled with air and fluids. The air and fluids may be removed by suction through the nasogastric tube. This can help with pain, discomfort, and nausea. It can also help the obstruction to clear up faster. °· Surgery. This may be required if other treatments do not work. Bowel obstruction from a hernia may require early surgery and can be an emergency procedure. Surgery may also be required for scar tissue that causes frequent or severe obstructions. °Follow these instructions at home: °· Get plenty of rest. °· Follow instructions from your health care provider about eating restrictions. You may need to avoid solid foods and consume only clear liquids until your condition improves. °· Take over-the-counter and prescription medicines only as told by your health care provider. °· Keep all follow-up visits as told by your health care provider. This is important. °Contact a health care provider if: °· You have a fever. °· You have chills. °Get help right away if: °· You have increased   pain or cramping. °· You vomit blood. °· You have uncontrolled vomiting or nausea. °· You cannot drink fluids because of vomiting or pain. °· You develop confusion. °· You begin feeling very dry or thirsty (dehydrated). °· You have severe bloating. °· You feel extremely weak or you faint. °This information is not intended to replace advice given to you by your health care provider. Make sure you discuss any  questions you have with your health care provider. °Document Released: 11/09/2005 Document Revised: 04/19/2016 Document Reviewed: 10/17/2014 °Elsevier Interactive Patient Education © 2017 [employer] Inc[employer] ° °

## 2016-12-01 ENCOUNTER — Ambulatory Visit: Payer: Medicare HMO

## 2016-12-01 ENCOUNTER — Encounter: Payer: Self-pay | Admitting: *Deleted

## 2016-12-01 ENCOUNTER — Ambulatory Visit: Payer: Medicare HMO | Admitting: Family Medicine

## 2016-12-01 NOTE — Progress Notes (Addendum)
A user error has taken place: charting done on wrong patient and has been corrected.

## 2016-12-02 ENCOUNTER — Ambulatory Visit: Payer: Medicare HMO

## 2016-12-02 ENCOUNTER — Telehealth: Payer: Self-pay | Admitting: *Deleted

## 2016-12-02 DIAGNOSIS — C01 Malignant neoplasm of base of tongue: Secondary | ICD-10-CM | POA: Diagnosis not present

## 2016-12-02 NOTE — Telephone Encounter (Signed)
Oncology Nurse Navigator Documentation  In follow-up to patient wife's call yesterday afternoon indicating there was no discussion of PEG placement last week when they met with Dr. Wyatt, called [facility]. Spoke with Armen, Dr. Wyatt's RN, explained referral note indicated consultation was for 1) assessment of abdominal hernia repair in context of upcoming RT, 2) placement of PEG. Per wife, there was no discussion of PEG. Armen indicated Dr. Wyatt is out of office until Monday but she will contact him for direction. I provided my phone number for needed follow-up.  I called patient wife, shared above information.  Rick Diehl, RN, BSN, CHPN Head & Neck Oncology Nurse Navigator [facility] at [place] [phone]  

## 2016-12-03 ENCOUNTER — Ambulatory Visit: Payer: Medicare HMO

## 2016-12-03 DIAGNOSIS — I428 Other cardiomyopathies: Secondary | ICD-10-CM | POA: Diagnosis not present

## 2016-12-03 DIAGNOSIS — C01 Malignant neoplasm of base of tongue: Secondary | ICD-10-CM | POA: Diagnosis not present

## 2016-12-03 DIAGNOSIS — Z79899 Other long term (current) drug therapy: Secondary | ICD-10-CM | POA: Diagnosis not present

## 2016-12-03 DIAGNOSIS — E119 Type 2 diabetes mellitus without complications: Secondary | ICD-10-CM | POA: Diagnosis not present

## 2016-12-03 DIAGNOSIS — R634 Abnormal weight loss: Secondary | ICD-10-CM | POA: Diagnosis not present

## 2016-12-03 DIAGNOSIS — Z7901 Long term (current) use of anticoagulants: Secondary | ICD-10-CM | POA: Diagnosis not present

## 2016-12-03 DIAGNOSIS — Z510 Encounter for antineoplastic radiation therapy: Secondary | ICD-10-CM | POA: Diagnosis not present

## 2016-12-03 DIAGNOSIS — Z95810 Presence of automatic (implantable) cardiac defibrillator: Secondary | ICD-10-CM | POA: Diagnosis not present

## 2016-12-03 DIAGNOSIS — I4891 Unspecified atrial fibrillation: Secondary | ICD-10-CM | POA: Diagnosis not present

## 2016-12-06 ENCOUNTER — Ambulatory Visit: Payer: Medicare HMO

## 2016-12-07 ENCOUNTER — Ambulatory Visit: Payer: Medicare HMO

## 2016-12-07 ENCOUNTER — Ambulatory Visit
Admission: RE | Admit: 2016-12-07 | Discharge: 2016-12-07 | Disposition: A | Discharge status: Home / Self Care | Payer: Medicare HMO | Source: Ambulatory Visit | Attending: Radiation Oncology | Admitting: Radiation Oncology

## 2016-12-07 ENCOUNTER — Ambulatory Visit: Payer: Medicare HMO | Admitting: Radiation Oncology

## 2016-12-07 DIAGNOSIS — Z79899 Other long term (current) drug therapy: Secondary | ICD-10-CM | POA: Diagnosis not present

## 2016-12-07 DIAGNOSIS — E119 Type 2 diabetes mellitus without complications: Secondary | ICD-10-CM | POA: Diagnosis not present

## 2016-12-07 DIAGNOSIS — R634 Abnormal weight loss: Secondary | ICD-10-CM | POA: Diagnosis not present

## 2016-12-07 DIAGNOSIS — Z7901 Long term (current) use of anticoagulants: Secondary | ICD-10-CM | POA: Diagnosis not present

## 2016-12-07 DIAGNOSIS — I4891 Unspecified atrial fibrillation: Secondary | ICD-10-CM | POA: Diagnosis not present

## 2016-12-07 DIAGNOSIS — Z510 Encounter for antineoplastic radiation therapy: Secondary | ICD-10-CM | POA: Diagnosis not present

## 2016-12-07 DIAGNOSIS — I428 Other cardiomyopathies: Secondary | ICD-10-CM | POA: Diagnosis not present

## 2016-12-07 DIAGNOSIS — Z95810 Presence of automatic (implantable) cardiac defibrillator: Secondary | ICD-10-CM | POA: Diagnosis not present

## 2016-12-07 DIAGNOSIS — C01 Malignant neoplasm of base of tongue: Secondary | ICD-10-CM | POA: Diagnosis not present

## 2016-12-08 ENCOUNTER — Ambulatory Visit
Admission: RE | Admit: 2016-12-08 | Discharge: 2016-12-08 | Disposition: A | Discharge status: Home / Self Care | Payer: Medicare HMO | Source: Ambulatory Visit | Attending: Radiation Oncology | Admitting: Radiation Oncology

## 2016-12-08 ENCOUNTER — Telehealth: Payer: Self-pay | Admitting: Hematology and Oncology

## 2016-12-08 DIAGNOSIS — C01 Malignant neoplasm of base of tongue: Secondary | ICD-10-CM | POA: Diagnosis not present

## 2016-12-08 DIAGNOSIS — I4891 Unspecified atrial fibrillation: Secondary | ICD-10-CM | POA: Diagnosis not present

## 2016-12-08 DIAGNOSIS — Z7901 Long term (current) use of anticoagulants: Secondary | ICD-10-CM | POA: Diagnosis not present

## 2016-12-08 DIAGNOSIS — Z79899 Other long term (current) drug therapy: Secondary | ICD-10-CM | POA: Diagnosis not present

## 2016-12-08 DIAGNOSIS — I428 Other cardiomyopathies: Secondary | ICD-10-CM | POA: Diagnosis not present

## 2016-12-08 DIAGNOSIS — Z510 Encounter for antineoplastic radiation therapy: Secondary | ICD-10-CM | POA: Diagnosis not present

## 2016-12-08 DIAGNOSIS — E119 Type 2 diabetes mellitus without complications: Secondary | ICD-10-CM | POA: Diagnosis not present

## 2016-12-08 DIAGNOSIS — Z95810 Presence of automatic (implantable) cardiac defibrillator: Secondary | ICD-10-CM | POA: Diagnosis not present

## 2016-12-08 DIAGNOSIS — R634 Abnormal weight loss: Secondary | ICD-10-CM | POA: Diagnosis not present

## 2016-12-08 NOTE — Telephone Encounter (Signed)
Appointments scheduled per in-basket message.   Patient notified °

## 2016-12-09 ENCOUNTER — Ambulatory Visit
Admission: RE | Admit: 2016-12-09 | Discharge: 2016-12-09 | Disposition: A | Discharge status: Home / Self Care | Payer: Medicare HMO | Source: Ambulatory Visit | Attending: Radiation Oncology | Admitting: Radiation Oncology

## 2016-12-09 DIAGNOSIS — E119 Type 2 diabetes mellitus without complications: Secondary | ICD-10-CM | POA: Diagnosis not present

## 2016-12-09 DIAGNOSIS — I428 Other cardiomyopathies: Secondary | ICD-10-CM | POA: Diagnosis not present

## 2016-12-09 DIAGNOSIS — I4891 Unspecified atrial fibrillation: Secondary | ICD-10-CM | POA: Diagnosis not present

## 2016-12-09 DIAGNOSIS — C01 Malignant neoplasm of base of tongue: Secondary | ICD-10-CM | POA: Diagnosis not present

## 2016-12-09 DIAGNOSIS — Z95810 Presence of automatic (implantable) cardiac defibrillator: Secondary | ICD-10-CM | POA: Diagnosis not present

## 2016-12-09 DIAGNOSIS — R634 Abnormal weight loss: Secondary | ICD-10-CM | POA: Diagnosis not present

## 2016-12-09 DIAGNOSIS — Z79899 Other long term (current) drug therapy: Secondary | ICD-10-CM | POA: Diagnosis not present

## 2016-12-09 DIAGNOSIS — Z7901 Long term (current) use of anticoagulants: Secondary | ICD-10-CM | POA: Diagnosis not present

## 2016-12-09 DIAGNOSIS — Z510 Encounter for antineoplastic radiation therapy: Secondary | ICD-10-CM | POA: Diagnosis not present

## 2016-12-10 ENCOUNTER — Telehealth: Payer: Self-pay | Admitting: *Deleted

## 2016-12-10 ENCOUNTER — Ambulatory Visit
Admission: RE | Admit: 2016-12-10 | Discharge: 2016-12-10 | Disposition: A | Discharge status: Home / Self Care | Payer: Medicare HMO | Source: Ambulatory Visit | Attending: Radiation Oncology | Admitting: Radiation Oncology

## 2016-12-10 DIAGNOSIS — Z79899 Other long term (current) drug therapy: Secondary | ICD-10-CM | POA: Diagnosis not present

## 2016-12-10 DIAGNOSIS — C01 Malignant neoplasm of base of tongue: Secondary | ICD-10-CM | POA: Diagnosis not present

## 2016-12-10 DIAGNOSIS — R634 Abnormal weight loss: Secondary | ICD-10-CM | POA: Diagnosis not present

## 2016-12-10 DIAGNOSIS — Z7901 Long term (current) use of anticoagulants: Secondary | ICD-10-CM | POA: Diagnosis not present

## 2016-12-10 DIAGNOSIS — I428 Other cardiomyopathies: Secondary | ICD-10-CM | POA: Diagnosis not present

## 2016-12-10 DIAGNOSIS — Z510 Encounter for antineoplastic radiation therapy: Secondary | ICD-10-CM | POA: Diagnosis not present

## 2016-12-10 DIAGNOSIS — I4891 Unspecified atrial fibrillation: Secondary | ICD-10-CM | POA: Diagnosis not present

## 2016-12-10 DIAGNOSIS — E119 Type 2 diabetes mellitus without complications: Secondary | ICD-10-CM | POA: Diagnosis not present

## 2016-12-10 DIAGNOSIS — Z95810 Presence of automatic (implantable) cardiac defibrillator: Secondary | ICD-10-CM | POA: Diagnosis not present

## 2016-12-10 NOTE — Telephone Encounter (Signed)
Oncology Nurse Navigator Documentation  Spoke with patient wife to check on well-being since Tuesday's RT start. She reported he is tolerating well, no concerns/needs. She indicated they have not heard back from [facility] Dr. Chesley Mires office re PEG evaluation/placement subsequent to my 3/29 call.  I indicated I will follow-up next week. I confirmed understanding of arrival to Radiation Waiting by 0800 following lobby registration for Tuesday morning's H&N [facility].  Gayleen Orem, RN, BSN, [place] Neck Oncology Nurse [place] at [place] [phone]

## 2016-12-11 ENCOUNTER — Ambulatory Visit: Payer: Medicare HMO

## 2016-12-13 ENCOUNTER — Other Ambulatory Visit: Payer: Self-pay | Admitting: Radiation Oncology

## 2016-12-13 ENCOUNTER — Ambulatory Visit
Admission: RE | Admit: 2016-12-13 | Discharge: 2016-12-13 | Disposition: A | Discharge status: Home / Self Care | Payer: Medicare HMO | Source: Ambulatory Visit | Attending: Radiation Oncology | Admitting: Radiation Oncology

## 2016-12-13 ENCOUNTER — Encounter: Payer: Self-pay | Admitting: *Deleted

## 2016-12-13 DIAGNOSIS — C01 Malignant neoplasm of base of tongue: Secondary | ICD-10-CM

## 2016-12-13 DIAGNOSIS — Z7901 Long term (current) use of anticoagulants: Secondary | ICD-10-CM | POA: Diagnosis not present

## 2016-12-13 DIAGNOSIS — E119 Type 2 diabetes mellitus without complications: Secondary | ICD-10-CM | POA: Diagnosis not present

## 2016-12-13 DIAGNOSIS — I428 Other cardiomyopathies: Secondary | ICD-10-CM | POA: Diagnosis not present

## 2016-12-13 DIAGNOSIS — Z95810 Presence of automatic (implantable) cardiac defibrillator: Secondary | ICD-10-CM | POA: Diagnosis not present

## 2016-12-13 DIAGNOSIS — I4891 Unspecified atrial fibrillation: Secondary | ICD-10-CM | POA: Diagnosis not present

## 2016-12-13 DIAGNOSIS — R634 Abnormal weight loss: Secondary | ICD-10-CM | POA: Diagnosis not present

## 2016-12-13 DIAGNOSIS — Z79899 Other long term (current) drug therapy: Secondary | ICD-10-CM | POA: Diagnosis not present

## 2016-12-13 DIAGNOSIS — Z510 Encounter for antineoplastic radiation therapy: Secondary | ICD-10-CM | POA: Diagnosis not present

## 2016-12-13 MED ORDER — BIAFINE EX EMUL
CUTANEOUS | Status: DC | PRN
  Administered 2016-12-13: 15:00:00 via TOPICAL

## 2016-12-13 MED ORDER — LIDOCAINE VISCOUS 2 % MT SOLN: OROMUCOSAL | 5 refills | Status: DC

## 2016-12-13 NOTE — Progress Notes (Signed)

## 2016-12-13 NOTE — Progress Notes (Signed)
Oncology Nurse Navigator Documentation  To provide support, encouragement and care continuity, met with patient and his wife following RT. He indicated he is tolerating treatments without difficulty, concern. I addressed their questions re attendance at tomorrow morning's H&N [place], reviewed arrival procedure. They understand to contact me with questions/concerns.  Gayleen Orem, RN, BSN, [place] Neck Oncology Nurse [place] at [place] [phone]

## 2016-12-14 ENCOUNTER — Ambulatory Visit: Payer: Medicare HMO | Attending: Radiation Oncology

## 2016-12-14 ENCOUNTER — Ambulatory Visit
Admission: RE | Admit: 2016-12-14 | Discharge: 2016-12-14 | Disposition: A | Discharge status: Home / Self Care | Payer: Medicare HMO | Source: Ambulatory Visit | Attending: Radiation Oncology | Admitting: Radiation Oncology

## 2016-12-14 ENCOUNTER — Encounter: Payer: Self-pay | Admitting: *Deleted

## 2016-12-14 ENCOUNTER — Ambulatory Visit: Payer: Medicare HMO | Admitting: Physical Therapy

## 2016-12-14 ENCOUNTER — Ambulatory Visit: Payer: Medicare HMO | Admitting: Nutrition

## 2016-12-14 DIAGNOSIS — E119 Type 2 diabetes mellitus without complications: Secondary | ICD-10-CM | POA: Diagnosis not present

## 2016-12-14 DIAGNOSIS — R634 Abnormal weight loss: Secondary | ICD-10-CM | POA: Diagnosis not present

## 2016-12-14 DIAGNOSIS — R29898 Other symptoms and signs involving the musculoskeletal system: Secondary | ICD-10-CM | POA: Diagnosis not present

## 2016-12-14 DIAGNOSIS — Z95810 Presence of automatic (implantable) cardiac defibrillator: Secondary | ICD-10-CM | POA: Diagnosis not present

## 2016-12-14 DIAGNOSIS — Z510 Encounter for antineoplastic radiation therapy: Secondary | ICD-10-CM | POA: Diagnosis not present

## 2016-12-14 DIAGNOSIS — R293 Abnormal posture: Secondary | ICD-10-CM | POA: Diagnosis not present

## 2016-12-14 DIAGNOSIS — R1310 Dysphagia, unspecified: Secondary | ICD-10-CM | POA: Diagnosis not present

## 2016-12-14 DIAGNOSIS — I428 Other cardiomyopathies: Secondary | ICD-10-CM | POA: Diagnosis not present

## 2016-12-14 DIAGNOSIS — Z7901 Long term (current) use of anticoagulants: Secondary | ICD-10-CM | POA: Diagnosis not present

## 2016-12-14 DIAGNOSIS — I4891 Unspecified atrial fibrillation: Secondary | ICD-10-CM | POA: Diagnosis not present

## 2016-12-14 DIAGNOSIS — Z79899 Other long term (current) drug therapy: Secondary | ICD-10-CM | POA: Diagnosis not present

## 2016-12-14 DIAGNOSIS — C01 Malignant neoplasm of base of tongue: Secondary | ICD-10-CM | POA: Diagnosis not present

## 2016-12-14 NOTE — Patient Instructions (Signed)
Head & Neck Multidisciplinary Clinic Clinical Social Work  Clinical Social Work met with patient/family at head & neck multidisciplinary clinic to offer support and assess for psychosocial needs.  Collin [name] was accompanied by his spouse.  The patient has two children and seven grandchildren.  CSW and patient discussed common emotional responses to cancer treatment and how patient plans to cope with cancer experience.  Clinical Social Work briefly discussed Clinical Social Work role and [employer] support programs/services.  Clinical Social Work encouraged patient to call with any additional questions or concerns.   Maryjean Morn, MSW, LCSW, OSW-C Clinical Social Worker [facility] [phone]

## 2016-12-14 NOTE — Progress Notes (Signed)
Patient was seen during Head and Neck Clinic.  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  He is a patient of Dr. Isidore Moos to receive radiation therapy.  Past medical history includes shingles, hyperlipidemia, heart disease, diabetes type 2, atrial fibrillation and acute renal failure.  Medications include Lasix, Glucophage, Zofran, MiraLAX, and Coumadin.  Labs were reviewed.  Height: 6 feet 0 inches. Weight: 268.5 pounds on March 27. Usual body weight: 285 pounds. BMI: 36.42.  Noted recent small bowel obstruction secondary to hernia. Patient denies difficulty eating at this time. He enjoys water reports and he drinks a lot of fluids. He began radiation therapy on April 3. There are plans for patient to receive a PEG by surgery. Patient does not check blood sugars.  Nutrition diagnosis:  Predicted suboptimal energy intake related to tongue cancer and associated treatments as evidenced by the history or presence of a condition for which research shows an increased incidence of suboptimal energy intake.  Intervention: Patient educated to consume high-calorie high-protein foods to promote weight maintenance or slow safe weight loss. Encouraged patient to check blood sugars on a daily basis. Encouraged frequent small meals and snacks. Brief education provided on feeding tube. Questions were answered.  Teach back method used.  Fact sheets provided and contact information given.  Monitoring, evaluation, goals: Patient will tolerate adequate calories and protein to minimize weight loss.  Next visit: To be scheduled with radiation weekly.  **Disclaimer: This note was dictated with voice recognition software. Similar sounding words can inadvertently be transcribed and this note may contain transcription errors which may not have been corrected upon publication of note.**

## 2016-12-14 NOTE — Therapy (Signed)
posture, and walking.    Status Achieved     Patient will be able to verbalize understanding of proper sitting and standing posture.    Status Achieved     Patient will be able to verbalize understanding of lymphedema risk and availability of treatment for this condition.    Status Achieved           Plan - 12/14/16 1749    Clinical Impression Statement This is a pleasant gentleman accompanied by his wife today; both seem to indicate that he may have some memory and/or reading limitations. He is diagnosed with right base of tongue cancer and has started XRT, which will be his only treatment.  He has an easily visible mass at the right side of his neck which limits his A/ROM.  He uses a walking stick for safe gait, and for "style," per his report.  He had slightly below average repetitions in 30 second sit to standand reported tired quads.  He doesn't do regular exercise.  Right shoulder motion was slightly limited.  Eval is moderate complexity due to multiple comorbidities including cardiac (A-fib with implanted defibrillator) and diabetes, as well as possible memory/reading problems. He has significant forward head posture.   Rehab Potential Good   PT Frequency One time visit   PT Treatment/Interventions Patient/family education   PT Next Visit Plan None at this time; may need therapy going forward should lymphedema develop   PT Home Exercise Plan neck ROM, walking   Consulted and Agree with Plan of Care Patient;Family member/caregiver      Patient will benefit from skilled therapeutic  intervention in order to improve the following deficits and impairments:  Decreased range of motion, Decreased mobility  Visit Diagnosis: Abnormal posture - Plan: PT plan of care cert/re-cert  Other symptoms and signs involving the musculoskeletal system - Plan: PT plan of care cert/re-cert      G-Codes - 09/38/18 1756    Functional Assessment Tool Used (Outpatient Only) clinical judgement   Functional Limitation Changing and maintaining body position  posture, neck ROM   Changing and Maintaining Body Position Current Status (E9937) At least 20 percent but less than 40 percent impaired, limited or restricted   Changing and Maintaining Body Position Goal Status (J6967) At least 20 percent but less than 40 percent impaired, limited or restricted   Changing and Maintaining Body Position Discharge Status (E9381) At least 20 percent but less than 40 percent impaired, limited or restricted       Problem List Patient Active Problem List   Diagnosis Date Noted  . Malignant neoplasm of base of tongue ([place]) 11/24/2016  . Umbilical hernia without obstruction and without gangrene   . Pressure injury of skin 11/14/2016  . Tongue mass: Per Ct neck 11/10/2016 11/12/2016  . SBO (small bowel obstruction) 11/11/2016  . Encounter for therapeutic drug monitoring 10/25/2013  . Diabetic peripheral neuropathy ([place]) 10/14/2011  . Hypotension 08/20/2011  . Acute renal failure ([place]) 08/20/2011  . OSA (obstructive sleep apnea) 04/15/2011  . ICD-[employer] Scientific 03/30/2011  . Chronic systolic CHF (congestive heart failure) ([place]) 03/30/2011  . Atrial fibrillation with RVR ([place]) 12/14/2010  . Nonischemic cardiomyopathy ([place]) 12/14/2010  . Type 2 diabetes mellitus ([place]) 12/14/2010  . Hyperlipidemia 12/14/2010    [name],[name] 12/14/2016, 6:00 PM  [place], [place], [address] Phone: [phone]   Fax:  [phone]  Name:  Collin [name] MRN: [id number] Date of Birth: March 03, 1941  Collin [name]  posture, and walking.    Status Achieved     Patient will be able to verbalize understanding of proper sitting and standing posture.    Status Achieved     Patient will be able to verbalize understanding of lymphedema risk and availability of treatment for this condition.    Status Achieved           Plan - 12/14/16 1749    Clinical Impression Statement This is a pleasant gentleman accompanied by his wife today; both seem to indicate that he may have some memory and/or reading limitations. He is diagnosed with right base of tongue cancer and has started XRT, which will be his only treatment.  He has an easily visible mass at the right side of his neck which limits his A/ROM.  He uses a walking stick for safe gait, and for "style," per his report.  He had slightly below average repetitions in 30 second sit to standand reported tired quads.  He doesn't do regular exercise.  Right shoulder motion was slightly limited.  Eval is moderate complexity due to multiple comorbidities including cardiac (A-fib with implanted defibrillator) and diabetes, as well as possible memory/reading problems. He has significant forward head posture.   Rehab Potential Good   PT Frequency One time visit   PT Treatment/Interventions Patient/family education   PT Next Visit Plan None at this time; may need therapy going forward should lymphedema develop   PT Home Exercise Plan neck ROM, walking   Consulted and Agree with Plan of Care Patient;Family member/caregiver      Patient will benefit from skilled therapeutic  intervention in order to improve the following deficits and impairments:  Decreased range of motion, Decreased mobility  Visit Diagnosis: Abnormal posture - Plan: PT plan of care cert/re-cert  Other symptoms and signs involving the musculoskeletal system - Plan: PT plan of care cert/re-cert      G-Codes - 09/38/18 1756    Functional Assessment Tool Used (Outpatient Only) clinical judgement   Functional Limitation Changing and maintaining body position  posture, neck ROM   Changing and Maintaining Body Position Current Status (E9937) At least 20 percent but less than 40 percent impaired, limited or restricted   Changing and Maintaining Body Position Goal Status (J6967) At least 20 percent but less than 40 percent impaired, limited or restricted   Changing and Maintaining Body Position Discharge Status (E9381) At least 20 percent but less than 40 percent impaired, limited or restricted       Problem List Patient Active Problem List   Diagnosis Date Noted  . Malignant neoplasm of base of tongue ([place]) 11/24/2016  . Umbilical hernia without obstruction and without gangrene   . Pressure injury of skin 11/14/2016  . Tongue mass: Per Ct neck 11/10/2016 11/12/2016  . SBO (small bowel obstruction) 11/11/2016  . Encounter for therapeutic drug monitoring 10/25/2013  . Diabetic peripheral neuropathy ([place]) 10/14/2011  . Hypotension 08/20/2011  . Acute renal failure ([place]) 08/20/2011  . OSA (obstructive sleep apnea) 04/15/2011  . ICD-[employer] 03/30/2011  . Chronic systolic CHF (congestive heart failure) ([place]) 03/30/2011  . Atrial fibrillation with RVR ([place]) 12/14/2010  . Nonischemic cardiomyopathy ([place]) 12/14/2010  . Type 2 diabetes mellitus ([place]) 12/14/2010  . Hyperlipidemia 12/14/2010    [name],[name] 12/14/2016, 6:00 PM  [place], [place], [address] Phone: [phone]   Fax:  [phone]  Name:  Collin [name] MRN: [id number] Date of Birth: March 03, 1941  Collin [name]  [place], [place], [address] Phone: [phone]   Fax:  [phone]  Physical Therapy Evaluation  Patient Details  Name: Collin [name] MRN: [id number] Date of Birth: [birth year] Referring Provider: Dr. Eppie Gibson  Encounter Date: 12/14/2016      PT End of Session - 12/14/16 1748    Visit Number 1   Number of Visits 1   PT Start Time 0930   PT Stop Time 1000   PT Time Calculation (min) 30 min   Activity Tolerance Patient tolerated treatment well   Behavior During Therapy [facility] for tasks assessed/performed      Past Medical History:  Diagnosis Date  . Atrial fibrillation ([place])   . Cardiomyopathy, nonischemic ([place]) 1998  . Diabetes mellitus   . Heart disease   . Hyperlipidemia   . OSA (obstructive sleep apnea)   . Shingles   . 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 It sometimes shows as a shadow on scans.     Past Surgical History:  Procedure Laterality Date  . CARDIAC DEFIBRILLATOR PLACEMENT  2006  . CATARACT EXTRACTION Bilateral    01/07/2016. 01/22/2016  . CYST EXCISION     multiple drainage to a cyst in his neck.   Marland Kitchen DIRECT LARYNGOSCOPY N/A 11/16/2016   Procedure: DIRECT LARYNGOSCOPY AND BIOPSY;  Surgeon: Rozetta Nunnery, MD;  Location: [facility];  Service: ENT;  Laterality: N/A;  . HERNIA REPAIR     1950's  . IMPLANTABLE CARDIOVERTER DEFIBRILLATOR (ICD) GENERATOR CHANGE N/A 05/10/2014   Procedure: ICD GENERATOR CHANGE;  Surgeon: [name], MD;  Location: [facility] CATH LAB;  Service: Cardiovascular;  Laterality: N/A;  . MASS EXCISION Right 11/16/2016   Procedure: EXCISION RIGHT NECK LIPOMA;  Surgeon: Rozetta Nunnery, MD;  Location: [facility];  Service: ENT;  Laterality: Right;  . MOHS SURGERY  01/01/2015   to top of head  . MOUTH SURGERY     teeth removed 4 teeth extracted 02/2010    There were no vitals filed for this  visit.       Subjective Assessment - 12/14/16 1725    Subjective Uses a walking stick for stability and style; he makes them from pool cues.   Patient is accompained by: Family member  wife   Pertinent History Biopsy on 11/16/16 revealed squamous cell carcinoma at the base of tongue, and benign adipose tissue consistent with lipoma at the right neck with no evidence of malignancy. Patient underwent a PET scan on 11/22/16. This showed hypermetabolic base of tongue mass compatible with known neoplasm. 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  He will lhave XRT only, due to comorbidities of renal failure, type 2 DM, cardiac history, started 12/07/16.   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 Eppie Gibson   Onset Date/Surgical Date 11/16/16   Prior Therapy none     Precautions   Precautions Other (comment)   Precaution Comments has implanted cardiac defibrillator; cancer precautions     Restrictions   Weight Bearing Restrictions No     Balance Screen  [place], [place], [address] Phone: [phone]   Fax:  [phone]  Physical Therapy Evaluation  Patient Details  Name: Collin [name] MRN: [id number] Date of Birth: [birth year] Referring Provider: Dr. Eppie Gibson  Encounter Date: 12/14/2016      PT End of Session - 12/14/16 1748    Visit Number 1   Number of Visits 1   PT Start Time 0930   PT Stop Time 1000   PT Time Calculation (min) 30 min   Activity Tolerance Patient tolerated treatment well   Behavior During Therapy [facility] for tasks assessed/performed      Past Medical History:  Diagnosis Date  . Atrial fibrillation ([place])   . Cardiomyopathy, nonischemic ([place]) 1998  . Diabetes mellitus   . Heart disease   . Hyperlipidemia   . OSA (obstructive sleep apnea)   . Shingles   . 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 It sometimes shows as a shadow on scans.     Past Surgical History:  Procedure Laterality Date  . CARDIAC DEFIBRILLATOR PLACEMENT  2006  . CATARACT EXTRACTION Bilateral    01/07/2016. 01/22/2016  . CYST EXCISION     multiple drainage to a cyst in his neck.   Marland Kitchen DIRECT LARYNGOSCOPY N/A 11/16/2016   Procedure: DIRECT LARYNGOSCOPY AND BIOPSY;  Surgeon: Rozetta Nunnery, MD;  Location: [facility];  Service: ENT;  Laterality: N/A;  . HERNIA REPAIR     1950's  . IMPLANTABLE CARDIOVERTER DEFIBRILLATOR (ICD) GENERATOR CHANGE N/A 05/10/2014   Procedure: ICD GENERATOR CHANGE;  Surgeon: [name], MD;  Location: [facility] CATH LAB;  Service: Cardiovascular;  Laterality: N/A;  . MASS EXCISION Right 11/16/2016   Procedure: EXCISION RIGHT NECK LIPOMA;  Surgeon: Rozetta Nunnery, MD;  Location: [facility];  Service: ENT;  Laterality: Right;  . MOHS SURGERY  01/01/2015   to top of head  . MOUTH SURGERY     teeth removed 4 teeth extracted 02/2010    There were no vitals filed for this  visit.       Subjective Assessment - 12/14/16 1725    Subjective Uses a walking stick for stability and style; he makes them from pool cues.   Patient is accompained by: Family member  wife   Pertinent History Biopsy on 11/16/16 revealed squamous cell carcinoma at the base of tongue, and benign adipose tissue consistent with lipoma at the right neck with no evidence of malignancy. Patient underwent a PET scan on 11/22/16. This showed hypermetabolic base of tongue mass compatible with known neoplasm. 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  He will lhave XRT only, due to comorbidities of renal failure, type 2 DM, cardiac history, started 12/07/16.   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 Eppie Gibson   Onset Date/Surgical Date 11/16/16   Prior Therapy none     Precautions   Precautions Other (comment)   Precaution Comments has implanted cardiac defibrillator; cancer precautions     Restrictions   Weight Bearing Restrictions No     Balance Screen

## 2016-12-14 NOTE — Progress Notes (Signed)
Head & Neck Multidisciplinary Clinic Clinical Social Work  Clinical Social Work met with patient/family at head & neck multidisciplinary clinic to offer support and assess for psychosocial needs.  Mr. Coia was accompanied by his spouse.  The patient shared his support system including his spouse, children, and grandchildren.  CSW and patient discussed coping throughout treatment and patient's current coping mechanisms.  Mr. Dolce reported he has a postive outlook on life, and also understands there will be significant side effects.  Patient was interested in [place] class and patient's spouse was interested in the [facility].  Clinical Social Work briefly discussed Clinical Social Work role and [employer] support programs/services.  Clinical Social Work encouraged patient to call with any additional questions or concerns.   Maryjean Morn, MSW, LCSW, OSW-C Clinical Social Worker [facility] [phone]

## 2016-12-15 ENCOUNTER — Ambulatory Visit
Admission: RE | Admit: 2016-12-15 | Discharge: 2016-12-15 | Disposition: A | Discharge status: Home / Self Care | Payer: Medicare HMO | Source: Ambulatory Visit | Attending: Radiation Oncology | Admitting: Radiation Oncology

## 2016-12-15 DIAGNOSIS — Z79899 Other long term (current) drug therapy: Secondary | ICD-10-CM | POA: Diagnosis not present

## 2016-12-15 DIAGNOSIS — I428 Other cardiomyopathies: Secondary | ICD-10-CM | POA: Diagnosis not present

## 2016-12-15 DIAGNOSIS — Z7901 Long term (current) use of anticoagulants: Secondary | ICD-10-CM | POA: Diagnosis not present

## 2016-12-15 DIAGNOSIS — E119 Type 2 diabetes mellitus without complications: Secondary | ICD-10-CM | POA: Diagnosis not present

## 2016-12-15 DIAGNOSIS — C01 Malignant neoplasm of base of tongue: Secondary | ICD-10-CM | POA: Diagnosis not present

## 2016-12-15 DIAGNOSIS — Z95810 Presence of automatic (implantable) cardiac defibrillator: Secondary | ICD-10-CM | POA: Diagnosis not present

## 2016-12-15 DIAGNOSIS — I4891 Unspecified atrial fibrillation: Secondary | ICD-10-CM | POA: Diagnosis not present

## 2016-12-15 DIAGNOSIS — R634 Abnormal weight loss: Secondary | ICD-10-CM | POA: Diagnosis not present

## 2016-12-15 DIAGNOSIS — R293 Abnormal posture: Secondary | ICD-10-CM | POA: Diagnosis not present

## 2016-12-15 DIAGNOSIS — R29898 Other symptoms and signs involving the musculoskeletal system: Secondary | ICD-10-CM | POA: Diagnosis not present

## 2016-12-15 DIAGNOSIS — R1310 Dysphagia, unspecified: Secondary | ICD-10-CM | POA: Diagnosis not present

## 2016-12-15 DIAGNOSIS — Z510 Encounter for antineoplastic radiation therapy: Secondary | ICD-10-CM | POA: Diagnosis not present

## 2016-12-15 NOTE — Patient Instructions (Signed)
SWALLOWING EXERCISES Do these 6 of the 7 days per week until 6 months after your last day of radiation, then 2 times per week afterwards  1. Effortful Swallows - Press your tongue against the roof of your mouth for 3 seconds, then squeeze          the muscles in your neck while you swallow your saliva or a sip of water - Repeat 20 times, 2-3 times a day, and use whenever you eat or drink  2. Masako Swallow - swallow with your tongue sticking out - Stick tongue out past your teeth and gently bite tongue with your teeth - Swallow, while holding your tongue with your teeth - Repeat 20 times, 2-3 times a day *use a wet spoon if your mouth gets dry*  3. Shaker Exercise - head lift - Lie flat on your back in your bed or on a couch without pillows - Raise your head and look at your feet - KEEP YOUR SHOULDERS DOWN - HOLD FOR 45-60 SECONDS, then lower your head back down - Repeat 3 times, 2-3 times a day  4. Mendelsohn Maneuver - "half swallow" exercise - Start to swallow, and keep your Adam's apple up by squeezing hard with the            muscles of the throat - Hold the squeeze for 5-7 seconds and then relax - Repeat 20 times, 2-3 times a day *use a wet spoon if your mouth gets dry*  5. Breath Hold - Say "HUH!" loudly, then hold your breath for 3 seconds at your voice box - Repeat 20 times, 2-3 times a day  6. Chin pushback - Open your mouth  - Place your fist UNDER your chin near your neck, and push back with your fist for 5 seconds - Repeat 10 times, 2-3 times a day

## 2016-12-15 NOTE — Therapy (Signed)
improve the following deficits and impairments:   Dysphagia, unspecified type      G-Codes - December 31, 2016 1733    Functional Assessment Tool Used NOMS   Functional Limitations Swallowing   Swallow Current Status (M0375) 0 percent impaired, limited or restricted   Swallow Goal Status (O3606) At least 1 percent but less than 20 percent impaired, limited or restricted      Problem List Patient Active Problem List   Diagnosis Date Noted  . Malignant neoplasm of base of tongue ([place]) 11/24/2016  . Umbilical hernia without obstruction and without gangrene   . Pressure injury of skin 11/14/2016  . Tongue mass: Per Ct neck 11/10/2016 11/12/2016  . SBO (small bowel obstruction) 11/11/2016  . Encounter for therapeutic drug monitoring 10/25/2013  . Diabetic peripheral neuropathy ([place]) 10/14/2011  . Hypotension 08/20/2011  . Acute renal failure ([place]) 08/20/2011  . OSA (obstructive sleep apnea) 04/15/2011  . ICD-[employer] 03/30/2011  . Chronic systolic CHF (congestive heart failure) ([place]) 03/30/2011  . Atrial fibrillation with RVR ([place]) 12/14/2010  . Nonischemic cardiomyopathy ([place]) 12/14/2010  . Type 2 diabetes mellitus ([place]) 12/14/2010  . Hyperlipidemia 12/14/2010    [facility] ,[place], CCC-SLP  12-31-2016, 5:33 PM  [place] [address] [place], [place], [address] Phone: [phone]   Fax:  [phone]  Name: Collin [name] MRN: [id number] Date of Birth: 1941/06/23  [place] [address] [place], [place], [address] Phone: [phone]   Fax:  [phone]  Speech Language Pathology Evaluation  Patient Details  Name: Collin [name] MRN: [id number] Date of Birth: 17-Dec-1940 Referring Provider: Eppie Gibson MD  Encounter Date: 12/14/2016      End of Session - 12/15/16 1722    Visit Number 1   Number of Visits 3   Date for SLP Re-Evaluation 02/25/17   SLP Start Time 7680   SLP Stop Time  1100   SLP Time Calculation (min) 46 min   Activity Tolerance Patient tolerated treatment well      Past Medical History:  Diagnosis Date  . Atrial fibrillation ([place])   . Cardiomyopathy, nonischemic ([place]) 1998  . Diabetes mellitus   . Heart disease   . Hyperlipidemia   . OSA (obstructive sleep apnea)   . Shingles   . 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 It sometimes shows as a shadow on scans.     Past Surgical History:  Procedure Laterality Date  . CARDIAC DEFIBRILLATOR PLACEMENT  2006  . CATARACT EXTRACTION Bilateral    01/07/2016. 01/22/2016  . CYST EXCISION     multiple drainage to a cyst in his neck.   Marland Kitchen DIRECT LARYNGOSCOPY N/A 11/16/2016   Procedure: DIRECT LARYNGOSCOPY AND BIOPSY;  Surgeon: Rozetta Nunnery, MD;  Location: [facility];  Service: ENT;  Laterality: N/A;  . HERNIA REPAIR     1950's  . IMPLANTABLE CARDIOVERTER DEFIBRILLATOR (ICD) GENERATOR CHANGE N/A 05/10/2014   Procedure: ICD GENERATOR CHANGE;  Surgeon: [name], MD;  Location: [facility] CATH LAB;  Service: Cardiovascular;  Laterality: N/A;  . MASS EXCISION Right 11/16/2016   Procedure: EXCISION RIGHT NECK LIPOMA;  Surgeon: Rozetta Nunnery, MD;  Location: [facility];  Service: ENT;  Laterality: Right;  . MOHS SURGERY  01/01/2015   to top of head  . MOUTH SURGERY     teeth removed 4 teeth extracted 02/2010    There were no vitals filed for this  visit.          SLP Evaluation [facility] - 12/15/16 0001      SLP Visit Information   SLP Received On 12/14/16   Referring Provider Eppie Gibson MD   Medical Diagnosis Base of Tongue CA     General Information   HPI Pt 76 y.o. with base of tongue CA with RT began 12-07-16 to tumor and bil neck nodes.     Prior Functional Status   Cognitive/Linguistic Baseline Baseline deficits   Baseline deficit details memory   Vocation Retired     Associate Professor   Overall Cognitive Status Impaired/Different from baseline   Area of Impairment Memory   Memory Decreased short-term memory   Memory Comments asking wife re: recent and salient medical history      Auditory Comprehension   Overall Auditory Comprehension Appears within functional limits for tasks assessed     Verbal Expression   Overall Verbal Expression Appears within functional limits for tasks assessed     Oral Motor/Sensory Function   Overall Oral Motor/Sensory Function Appears within functional limits for tasks assessed     Motor Speech   Overall Motor Speech Appears within functional limits for tasks assessed      Pt currently tolerates regular/soft diet with thin liquids.  POs: Pt ate [place] sandwich and drank H2O without overt s/s  improve the following deficits and impairments:   Dysphagia, unspecified type      G-Codes - December 31, 2016 1733    Functional Assessment Tool Used NOMS   Functional Limitations Swallowing   Swallow Current Status (M0375) 0 percent impaired, limited or restricted   Swallow Goal Status (O3606) At least 1 percent but less than 20 percent impaired, limited or restricted      Problem List Patient Active Problem List   Diagnosis Date Noted  . Malignant neoplasm of base of tongue ([place]) 11/24/2016  . Umbilical hernia without obstruction and without gangrene   . Pressure injury of skin 11/14/2016  . Tongue mass: Per Ct neck 11/10/2016 11/12/2016  . SBO (small bowel obstruction) 11/11/2016  . Encounter for therapeutic drug monitoring 10/25/2013  . Diabetic peripheral neuropathy ([place]) 10/14/2011  . Hypotension 08/20/2011  . Acute renal failure ([place]) 08/20/2011  . OSA (obstructive sleep apnea) 04/15/2011  . ICD-[employer] 03/30/2011  . Chronic systolic CHF (congestive heart failure) ([place]) 03/30/2011  . Atrial fibrillation with RVR ([place]) 12/14/2010  . Nonischemic cardiomyopathy ([place]) 12/14/2010  . Type 2 diabetes mellitus ([place]) 12/14/2010  . Hyperlipidemia 12/14/2010    [facility] ,[place], CCC-SLP  12-31-2016, 5:33 PM  [place] [address] [place], [place], [address] Phone: [phone]   Fax:  [phone]  Name: Collin [name] MRN: [id number] Date of Birth: 1941/06/23

## 2016-12-16 ENCOUNTER — Encounter (HOSPITAL_COMMUNITY): Payer: Self-pay | Admitting: Dentistry

## 2016-12-16 ENCOUNTER — Ambulatory Visit
Admission: RE | Admit: 2016-12-16 | Discharge: 2016-12-16 | Disposition: A | Discharge status: Home / Self Care | Payer: Medicare HMO | Source: Ambulatory Visit | Attending: Radiation Oncology | Admitting: Radiation Oncology

## 2016-12-16 ENCOUNTER — Ambulatory Visit (HOSPITAL_COMMUNITY): Payer: Self-pay | Admitting: Dentistry

## 2016-12-16 VITALS — BP 122/72 | HR 78 | Temp 98.0°F

## 2016-12-16 DIAGNOSIS — K036 Deposits [accretions] on teeth: Secondary | ICD-10-CM

## 2016-12-16 DIAGNOSIS — K029 Dental caries, unspecified: Secondary | ICD-10-CM

## 2016-12-16 DIAGNOSIS — Z510 Encounter for antineoplastic radiation therapy: Secondary | ICD-10-CM | POA: Diagnosis not present

## 2016-12-16 DIAGNOSIS — I4891 Unspecified atrial fibrillation: Secondary | ICD-10-CM | POA: Diagnosis not present

## 2016-12-16 DIAGNOSIS — Z029 Encounter for administrative examinations, unspecified: Secondary | ICD-10-CM

## 2016-12-16 DIAGNOSIS — C01 Malignant neoplasm of base of tongue: Secondary | ICD-10-CM | POA: Diagnosis not present

## 2016-12-16 DIAGNOSIS — K0530 Chronic periodontitis, unspecified: Secondary | ICD-10-CM

## 2016-12-16 DIAGNOSIS — R634 Abnormal weight loss: Secondary | ICD-10-CM | POA: Diagnosis not present

## 2016-12-16 DIAGNOSIS — I428 Other cardiomyopathies: Secondary | ICD-10-CM | POA: Diagnosis not present

## 2016-12-16 DIAGNOSIS — E119 Type 2 diabetes mellitus without complications: Secondary | ICD-10-CM | POA: Diagnosis not present

## 2016-12-16 DIAGNOSIS — Z95810 Presence of automatic (implantable) cardiac defibrillator: Secondary | ICD-10-CM | POA: Diagnosis not present

## 2016-12-16 DIAGNOSIS — Z79899 Other long term (current) drug therapy: Secondary | ICD-10-CM | POA: Diagnosis not present

## 2016-12-16 DIAGNOSIS — Z7901 Long term (current) use of anticoagulants: Secondary | ICD-10-CM | POA: Diagnosis not present

## 2016-12-16 MED ORDER — SODIUM FLUORIDE 1.1 % DT CREA: TOPICAL_CREAM | DENTAL | 99 refills | Status: DC

## 2016-12-16 NOTE — Progress Notes (Signed)
Oncology Nurse Navigator Documentation  Met with Collin [name] during [facility] [place].  He was accompanied by his wife.  Arrived him to Nursing, provided verbal and written overview of [place], the clinicians who will be seeing him, encouraged him to ask questions during his time with them.  He was seen by Nutrition, SLP, PT, SW and [place].  Spoke with him at end of [facility], addressed questions.  He proceeded to [facility] for treatment. He understands I can be contacted with needs/concerns.  Gayleen Orem, RN, BSN, [place] at [facility] [phone]

## 2016-12-16 NOTE — Patient Instructions (Signed)
Plan/recommendations: 1. Brush after meals and at bedtime. Floss at bedtime. 2. Use the PreviDent 5000 toothpaste at bedtime as per directions provided to the patient today.  3. Use salt water/baking soda rinses or Biotene rinses as needed. 4. Multiple sips of water as needed to maintain hydration.  Return to dental medicine clinic as scheduled. Patient to be seen approximately one month after the radiation therapy is complete. Patient to call if problems arise before then.  The patient was dismissed to the care of his wife in stable condition. Patient to have next dose of radiation therapy later this afternoon.   Lenn Cal, DDS

## 2016-12-16 NOTE — Progress Notes (Signed)
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 The patient was referred to an oral surgeon, Dr. Roney Jaffe, for second opinion concerning dental extractions. After review of anticipated ports and doses, a decision was made to defer dental extractions at this time until after radiation therapy. Dr. Loyal Gambler felt tooth #'s 18 and 31 could be kept below 5000 cGy and restored in the future. The patient is currently undergoing radiation therapy. The patient now presents for evaluation for periodontal therapy and several dental restorations. I discussed the risks, benefits, potential complications for the proposed treatment. The patient wishes to proceed with dental restoration on tooth #'s 7,28, and 29 with initial periodontal therapy as time and space and patient condition will allow.  Procedure: 36 mg Xylocaine with .018 mg epi via infiltration area #7. 18mg  Xylocaine with .009 mg epi via PDL injection area #28/29.  B3532 #7 DL/Resin AE,P, DBA, Tetric Ceram EVO [place]ade A 3.5. Contour and polish. D9242 #28 Do/amalgam Gluma, Tytin alloy. Burnish. A8341 #29 MO/Amalgam Gluma, Tytin alloy. Burnish. Occlusion was checked and adjusted as needed.  D1110 Adult prophylaxis with KaVo Sonic scaler. Selective hand curettes used as needed. [place]). Oral hygiene instructions provided. Rx: Prevident 5000 Plus Brush at bedtime as instructed. One tube. PRN refills escript [place]. (Fluoride trays will not be fabricated until after the dental extractions occur) Patient accepts results. Post op BP: 135/79  P:84 Patient tolerated the procedure well.  I then had an extensive discussion with the patient and his  wife concerning the future plan of care for the patient. The patient has additional dental caries involving the distal facial of #8, occlusal caries of #18 and occlusal caries of #31. In light of the current active radiation therapy, additional dental restorations will not be performed at this time. I also discussed the future dental extractions by Dr. Roney Jaffe, oral surgeon, after the radiation therapy has been completed.  Plan/recommendations: 1. Brush after meals and at bedtime. Floss at bedtime. 2. Use the PreviDent 5000 toothpaste at bedtime as per directions provided to the patient today.  3. Use salt water/baking soda rinses or Biotene rinses as needed. 4. Multiple sips of water as needed to maintain hydration.  Return to dental medicine clinic as scheduled. Patient to be seen approximately one month after the radiation therapy is complete. Patient to call if problems arise before then.  The patient was dismissed to the care of his wife in stable condition. Patient to have next dose of radiation therapy later this afternoon.   Lenn Cal, DDS

## 2016-12-17 ENCOUNTER — Ambulatory Visit
Admission: RE | Admit: 2016-12-17 | Discharge: 2016-12-17 | Disposition: A | Discharge status: Home / Self Care | Payer: Medicare HMO | Source: Ambulatory Visit | Attending: Radiation Oncology | Admitting: Radiation Oncology

## 2016-12-17 DIAGNOSIS — Z7901 Long term (current) use of anticoagulants: Secondary | ICD-10-CM | POA: Diagnosis not present

## 2016-12-17 DIAGNOSIS — E119 Type 2 diabetes mellitus without complications: Secondary | ICD-10-CM | POA: Diagnosis not present

## 2016-12-17 DIAGNOSIS — Z510 Encounter for antineoplastic radiation therapy: Secondary | ICD-10-CM | POA: Diagnosis not present

## 2016-12-17 DIAGNOSIS — I4891 Unspecified atrial fibrillation: Secondary | ICD-10-CM | POA: Diagnosis not present

## 2016-12-17 DIAGNOSIS — Z95810 Presence of automatic (implantable) cardiac defibrillator: Secondary | ICD-10-CM | POA: Diagnosis not present

## 2016-12-17 DIAGNOSIS — C01 Malignant neoplasm of base of tongue: Secondary | ICD-10-CM | POA: Diagnosis not present

## 2016-12-17 DIAGNOSIS — Z79899 Other long term (current) drug therapy: Secondary | ICD-10-CM | POA: Diagnosis not present

## 2016-12-17 DIAGNOSIS — R634 Abnormal weight loss: Secondary | ICD-10-CM | POA: Diagnosis not present

## 2016-12-17 DIAGNOSIS — I428 Other cardiomyopathies: Secondary | ICD-10-CM | POA: Diagnosis not present

## 2016-12-17 NOTE — Progress Notes (Signed)
Oncology Nurse Navigator Documentation  Met with [name] following RT.  His wife accompanied him. He reported tolerating tmts without difficulty, denied concerns. I escorted him to [facility] with Dr. Isidore Moos. They understand to call me needs/concerns.  Gayleen Orem, RN, BSN, [place] [facility] Oncology Nurse [place] at [place] [phone]

## 2016-12-18 ENCOUNTER — Ambulatory Visit: Payer: Medicare HMO

## 2016-12-19 ENCOUNTER — Ambulatory Visit: Payer: Medicare HMO

## 2016-12-20 ENCOUNTER — Ambulatory Visit
Admission: RE | Admit: 2016-12-20 | Discharge: 2016-12-20 | Disposition: A | Discharge status: Home / Self Care | Payer: Medicare HMO | Source: Ambulatory Visit | Attending: Radiation Oncology | Admitting: Radiation Oncology

## 2016-12-20 ENCOUNTER — Ambulatory Visit (INDEPENDENT_AMBULATORY_CARE_PROVIDER_SITE_OTHER): Payer: Medicare HMO | Admitting: *Deleted

## 2016-12-20 ENCOUNTER — Other Ambulatory Visit: Payer: Self-pay | Admitting: Radiation Oncology

## 2016-12-20 DIAGNOSIS — Z79899 Other long term (current) drug therapy: Secondary | ICD-10-CM | POA: Diagnosis not present

## 2016-12-20 DIAGNOSIS — Z510 Encounter for antineoplastic radiation therapy: Secondary | ICD-10-CM | POA: Diagnosis not present

## 2016-12-20 DIAGNOSIS — C01 Malignant neoplasm of base of tongue: Secondary | ICD-10-CM | POA: Diagnosis not present

## 2016-12-20 DIAGNOSIS — E119 Type 2 diabetes mellitus without complications: Secondary | ICD-10-CM | POA: Diagnosis not present

## 2016-12-20 DIAGNOSIS — Z7901 Long term (current) use of anticoagulants: Secondary | ICD-10-CM | POA: Diagnosis not present

## 2016-12-20 DIAGNOSIS — R634 Abnormal weight loss: Secondary | ICD-10-CM | POA: Diagnosis not present

## 2016-12-20 DIAGNOSIS — I428 Other cardiomyopathies: Secondary | ICD-10-CM

## 2016-12-20 DIAGNOSIS — Z95810 Presence of automatic (implantable) cardiac defibrillator: Secondary | ICD-10-CM | POA: Diagnosis not present

## 2016-12-20 DIAGNOSIS — I4891 Unspecified atrial fibrillation: Secondary | ICD-10-CM | POA: Diagnosis not present

## 2016-12-20 MED ORDER — NYSTATIN 100000 UNIT/ML MT SUSP: 5.0000 mL | Freq: Four times a day (QID) | OROMUCOSAL | 0 refills | Status: DC

## 2016-12-20 NOTE — Progress Notes (Signed)
Remote ICD transmission.   

## 2016-12-21 ENCOUNTER — Telehealth: Payer: Self-pay | Admitting: Hematology and Oncology

## 2016-12-21 ENCOUNTER — Ambulatory Visit
Admission: RE | Admit: 2016-12-21 | Discharge: 2016-12-21 | Disposition: A | Discharge status: Home / Self Care | Payer: Medicare HMO | Source: Ambulatory Visit | Attending: Radiation Oncology | Admitting: Radiation Oncology

## 2016-12-21 ENCOUNTER — Ambulatory Visit (HOSPITAL_BASED_OUTPATIENT_CLINIC_OR_DEPARTMENT_OTHER): Payer: Medicare HMO

## 2016-12-21 ENCOUNTER — Encounter: Payer: Self-pay | Admitting: Hematology and Oncology

## 2016-12-21 ENCOUNTER — Telehealth: Payer: Self-pay

## 2016-12-21 ENCOUNTER — Ambulatory Visit (HOSPITAL_BASED_OUTPATIENT_CLINIC_OR_DEPARTMENT_OTHER): Payer: Medicare HMO | Admitting: Hematology and Oncology

## 2016-12-21 VITALS — BP 122/75 | HR 87 | Temp 99.2°F | Resp 18 | Ht 72.0 in | Wt 256.2 lb

## 2016-12-21 DIAGNOSIS — C01 Malignant neoplasm of base of tongue: Secondary | ICD-10-CM

## 2016-12-21 DIAGNOSIS — I5022 Chronic systolic (congestive) heart failure: Secondary | ICD-10-CM | POA: Diagnosis not present

## 2016-12-21 DIAGNOSIS — R634 Abnormal weight loss: Secondary | ICD-10-CM | POA: Diagnosis not present

## 2016-12-21 DIAGNOSIS — I4891 Unspecified atrial fibrillation: Secondary | ICD-10-CM | POA: Diagnosis not present

## 2016-12-21 DIAGNOSIS — B370 Candidal stomatitis: Secondary | ICD-10-CM | POA: Insufficient documentation

## 2016-12-21 DIAGNOSIS — Z79899 Other long term (current) drug therapy: Secondary | ICD-10-CM | POA: Diagnosis not present

## 2016-12-21 DIAGNOSIS — E118 Type 2 diabetes mellitus with unspecified complications: Secondary | ICD-10-CM

## 2016-12-21 DIAGNOSIS — I428 Other cardiomyopathies: Secondary | ICD-10-CM | POA: Diagnosis not present

## 2016-12-21 DIAGNOSIS — Z510 Encounter for antineoplastic radiation therapy: Secondary | ICD-10-CM | POA: Diagnosis not present

## 2016-12-21 DIAGNOSIS — E119 Type 2 diabetes mellitus without complications: Secondary | ICD-10-CM | POA: Diagnosis not present

## 2016-12-21 DIAGNOSIS — Z7901 Long term (current) use of anticoagulants: Secondary | ICD-10-CM | POA: Diagnosis not present

## 2016-12-21 DIAGNOSIS — Z95810 Presence of automatic (implantable) cardiac defibrillator: Secondary | ICD-10-CM | POA: Diagnosis not present

## 2016-12-21 LAB — CBC WITH DIFFERENTIAL/PLATELET
BASO%: 0.5 % (ref 0.0–2.0)
Basophils Absolute: 0 10*3/uL (ref 0.0–0.1)
EOS%: 1.2 % (ref 0.0–7.0)
Eosinophils Absolute: 0.1 10*3/uL (ref 0.0–0.5)
HCT: 43.7 % (ref 38.4–49.9)
HGB: 14.5 g/dL (ref 13.0–17.1)
LYMPH%: 4.4 % — ABNORMAL LOW (ref 14.0–49.0)
MCH: 29.1 pg (ref 27.2–33.4)
MCHC: 33.1 g/dL (ref 32.0–36.0)
MCV: 87.9 fL (ref 79.3–98.0)
MONO#: 0.6 10*3/uL (ref 0.1–0.9)
MONO%: 11.3 % (ref 0.0–14.0)
NEUT#: 4.4 10*3/uL (ref 1.5–6.5)
NEUT%: 82.6 % — ABNORMAL HIGH (ref 39.0–75.0)
Platelets: 198 10*3/uL (ref 140–400)
RBC: 4.97 10*6/uL (ref 4.20–5.82)
RDW: 14.6 % (ref 11.0–14.6)
WBC: 5.3 10*3/uL (ref 4.0–10.3)
lymph#: 0.2 10*3/uL — ABNORMAL LOW (ref 0.9–3.3)

## 2016-12-21 LAB — COMPREHENSIVE METABOLIC PANEL
ALT: 23 U/L (ref 0–55)
AST: 19 U/L (ref 5–34)
Albumin: 3.4 g/dL — ABNORMAL LOW (ref 3.5–5.0)
Alkaline Phosphatase: 119 U/L (ref 40–150)
Anion Gap: 13 mEq/L — ABNORMAL HIGH (ref 3–11)
BUN: 17 mg/dL (ref 7.0–26.0)
CO2: 25 mEq/L (ref 22–29)
Calcium: 9.9 mg/dL (ref 8.4–10.4)
Chloride: 101 mEq/L (ref 98–109)
Creatinine: 0.7 mg/dL (ref 0.7–1.3)
EGFR: 90 mL/min/{1.73_m2} — ABNORMAL LOW (ref >90)
Glucose: 106 mg/dl (ref 70–140)
Potassium: 4.3 mEq/L (ref 3.5–5.1)
Sodium: 139 mEq/L (ref 136–145)
Total Bilirubin: 0.8 mg/dL (ref 0.20–1.20)
Total Protein: 7.9 g/dL (ref 6.4–8.3)

## 2016-12-21 LAB — TSH: TSH: 0.147 m(IU)/L — ABNORMAL LOW (ref 0.320–4.118)

## 2016-12-21 LAB — PROTIME-INR
INR: 2.5 (ref 2.00–3.50)
Protime: 30 Seconds — ABNORMAL HIGH (ref 10.6–13.4)

## 2016-12-21 NOTE — Assessment & Plan Note (Signed)
He has lost a great deal of weight due to altered taste sensation We spent a lot of time discussing options such as soft ache that would be easy for him to swallow I also wrote the prescription to help pay for his nutritional supplement I warned him about risk of hypoglycemia with poor oral intake while he is on metformin If he cannot eat consistently, I recommend he hold his metformin treatment The patient is also at risk of dehydration His oral fluid intake is minimum I recommend consideration to hold Lasix if he cannot drink adequate amount of oral liquids I also warned him about risk of warfarin toxicity in the setting of poor oral intake Thankfully, PT/INR monitoring today is stable

## 2016-12-21 NOTE — Telephone Encounter (Signed)
-----   Message from Heath Lark, MD sent at 12/21/2016  4:14 PM EDT ----- Regarding: labs Pls let him know most of his labs are OK INR stable at 2.5

## 2016-12-21 NOTE — Assessment & Plan Note (Signed)
He has some residual oral thrush According to his wife, the nystatin swish and swallow was helping If it does not improve over the next 2 days, I have instructed his wife to call me and we might need to prescribe fluconazole

## 2016-12-21 NOTE — Assessment & Plan Note (Signed)
The patient is at risk of dehydration, in the setting of poor oral intake and Lasix treatment I will monitor him closely Today, he has no significant signs of fluid overload

## 2016-12-21 NOTE — Telephone Encounter (Signed)
LM with note below 

## 2016-12-21 NOTE — Assessment & Plan Note (Signed)
The patient is at risk of hypoglycemia due to poor oral intake As above, I have instructed his wife to hold metformin if he is not eating consistently

## 2016-12-21 NOTE — Telephone Encounter (Signed)
Spoke with pt regarding ATP episode from 4/3 pt doesn't remember any significant that day, except that was his first day of radiation treatment . Informed pt that episode occurred at 9:30pm, so it was unrelated to him receiving radiation.  Pt aware that driving restrictions of 6 months from last date of therapy. Informed pt that Dr. Lovena Le would review episode and he would recieve a call if any further follow-up is needed.

## 2016-12-21 NOTE — Progress Notes (Signed)
Collin [name] OFFICE PROGRESS NOTE  Patient Care Team: Eulas Post, MD as PCP - General (Family Medicine) Eppie Gibson, MD as Attending Physician (Radiation Oncology) Heath Lark, MD as Consulting Physician (Hematology and Oncology) Leota Sauers, RN as Oncology Nurse [place], [place] as Dietitian (Nutrition) Jomarie Longs, PT as Physical Therapist (Physical Therapy) Sharen Counter, CCC-SLP as Speech Language Pathologist (Speech Pathology) Kennith Center, LCSW as Social Worker Rozetta Nunnery, MD as Consulting Physician (Otolaryngology)  SUMMARY OF ONCOLOGIC HISTORY:   Malignant neoplasm of base of tongue ([place])   11/08/2016 Pathology Results    INSUFFICIENT FOR DIAGNOSIS. The Specimen Is Processed And Examined Microscopically, But Found To Be UNSATISFACTORY For Evaluation. Diagnosis NONDIAGNOSTIC MATERIAL      11/09/2016 Imaging    CT abdomen: Umbilical hernia containing only peritoneal fat. No bowel obstruction. Chronic cholelithiasis. A gallstone is present on the prior study of 06/10/2014. 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      11/10/2016 Imaging    CT neck: Right base of tongue mass likely representing carcinoma. The mass measures up to 37 mm. Visible mass invades base of tongue 27 mm and extends 9 mm to contralateral base of tongue. 2. Right level 2, 3, 4, 5a/b necrotic lymphadenopathy with findings of extra nodal extension. Left level 2 lymphadenopathy      11/10/2016 - 11/17/2016 Hospital Admission    He was admitted for management of bowel obstruction, managed conservatively      11/16/2016 Pathology Results    1. Tongue, biopsy, Base - SQUAMOUS CELL CARCINOMA. - SEE MICROSCOPIC DESCRIPTION 2. Soft tissue, lipoma, Right Neck - BENIGN ADIPOSE TISSUE CONSISTENT WITH LIPOMA. - NO EVIDENCE OF MALIGNANCY. Microscopic Comment 1. Immunohistochemistry for p16 will be performed and reported as an  addendum. Addendum: Immunohistochemistry for p16 (HPV) shows diffuse strong positivity in the tumor.       11/16/2016 Surgery    OPERATION PERFORMED:  Direct laryngoscopy and biopsy of base of tongue mass (frozen section positive for squamous cell carcinoma).  Excision of large right neck lipoma, 10 x 12 cm in size.  SURGEON:  Leonides Sake. Lucia Gaskins, MD.  DESCRIPTION OF PROCEDURE:  Oral cavity was clear.  Base of tongue had a partially exophytic mass extending down to the vallecula.  The epiglottis was uninvolved.  On direct laryngoscopy of the laryngeal surface, epiglottis was clear.  AE folds were clear.  Vocal cords were clear.  Piriform sinuses were clear. Next, biopsies were obtained from the base of tongue and sent for frozen section.  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        11/22/2016 PET scan    Hypermetabolic base of tongue mass compatible with known neoplasm. 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 No evidence for distant metastatic disease. Aortic atherosclerosis and coronary artery calcification. Gallstones.      12/07/2016 -  Radiation Therapy    The patient has received radiation treatment        INTERVAL HISTORY: Please see below for problem oriented charting. He is seen today as part of supportive care visit He develop mucositis and thrush recently, started on nystatin swish and swallow According to his wife, this has somewhat improved He has altered taste sensation limiting his ability to eat He has lost significant amount of weight over the last few days His intake yesterday consists of one ensure, approximately a liter and a half of water  intake, a small pudding and eggs with cheese He denies nausea or vomiting No constipation he felt that the right neck mass is enlarging somewhat but he denies dysphagia The patient denies any recent signs or symptoms of bleeding such as spontaneous  epistaxis, hematuria or hematochezia.   REVIEW OF SYSTEMS:   Constitutional: Denies fevers, chills  Eyes: Denies blurriness of vision Respiratory: Denies cough, dyspnea or wheezes Cardiovascular: Denies palpitation, chest discomfort or lower extremity swelling Gastrointestinal:  Denies nausea, heartburn or change in bowel habits Skin: Denies abnormal skin rashes Neurological:Denies numbness, tingling or new weaknesses Behavioral/Psych: Mood is stable, no new changes  All other systems were reviewed with the patient and are negative.  I have reviewed the past medical history, past surgical history, social history and family history with the patient and they are unchanged from previous note.  ALLERGIES:  is allergic to niaspan [niacin er].  MEDICATIONS:  Current Outpatient Prescriptions  Medication Sig Dispense Refill  . acetaminophen (TYLENOL) 500 MG tablet Take 1,000 mg by mouth every 6 (six) hours as needed for moderate pain.    . carvedilol (COREG) 25 MG tablet TAKE 1 TABLET TWICE DAILY WITH MEALS 180 tablet 1  . digoxin (LANOXIN) 0.125 MG tablet Take 1 tablet (0.125 mg total) by mouth daily. 90 tablet 3  . diltiazem (CARDIZEM) 90 MG tablet Take 1 tablet (90 mg total) by mouth daily. 30 tablet 3  . furosemide (LASIX) 20 MG tablet Take 1 tablet (20 mg total) by mouth daily. 30 tablet 3  . lidocaine (XYLOCAINE) 2 % solution Patient: Mix 1part 2% viscous lidocaine, 1part H20. Swish and swallow 37mL of this mixture, 45min before meals and at bedtime, up to QID 100 mL 5  . metFORMIN (GLUCOPHAGE) 500 MG tablet TAKE 1 TABLET TWICE DAILY WITH MEALS 180 tablet 0  . nystatin (MYCOSTATIN) 100000 UNIT/ML suspension Take 5 mLs (500,000 Units total) by mouth 4 (four) times daily. Swish in mouth for a minute, then swallow. Do this for a total of 3 weeks. 420 mL 0  . ondansetron (ZOFRAN ODT) 4 MG disintegrating tablet Take 1 tablet (4 mg total) by mouth every 8 (eight) hours as needed for nausea or  vomiting. 20 tablet 0  . polyethylene glycol (MIRALAX) packet Take 17 g by mouth daily as needed. Available OTC 30 each 0  . pravastatin (PRAVACHOL) 40 MG tablet TAKE 1 TABLET EVERY DAY 90 tablet 2  . sodium fluoride (PREVIDENT 5000 PLUS) 1.1 % CREA dental cream Apply gel to tooth brush. Brush teeth for 2 minutes. Spit out excess-DO NOT swallow. Repeat nightly. 1 Tube prn  . warfarin (COUMADIN) 5 MG tablet Take as directed by anticoagulation clinic. (Patient taking differently: Take 5-7.5 mg by mouth daily. Take 5mg  daily except take 7.5mg  on Monday.) 105 tablet 1   No current facility-administered medications for this visit.     PHYSICAL EXAMINATION: ECOG PERFORMANCE STATUS: 1 - Symptomatic but completely ambulatory  Vitals:   12/21/16 1228  BP: 122/75  Pulse: 87  Resp: 18  Temp: 99.2 F (37.3 C)   Filed Weights   12/21/16 1228  Weight: 256 lb 3.2 oz (116.2 kg)    GENERAL:alert, no distress and comfortable SKIN: skin color, texture, turgor are normal, no rashes or significant lesions EYES: normal, Conjunctiva are pink and non-injected, sclera clear OROPHARYNX: Noticed signs of mucositis and thrush NECK: supple, thyroid normal size, non-tender, without nodularity LYMPH: He has a large right cervical lymph node, slightly worse than prior visit  LUNGS: clear to auscultation and percussion with normal breathing effort HEART: regular rate & rhythm and no murmurs with moderate bilateral and no lower extremity edema ABDOMEN:abdomen soft, non-tender and normal bowel sounds Musculoskeletal:no cyanosis of digits and no clubbing  NEURO: alert & oriented x 3 with fluent speech, no focal motor/sensory deficits  LABORATORY DATA:  I have reviewed the data as listed    Component Value Date/Time   NA 139 12/21/2016 1423   K 4.3 12/21/2016 1423   CL 99 (L) 11/17/2016 0211   CO2 25 12/21/2016 1423   GLUCOSE 106 12/21/2016 1423   BUN 17.0 12/21/2016 1423   CREATININE 0.7 12/21/2016 1423    CALCIUM 9.9 12/21/2016 1423   PROT 7.9 12/21/2016 1423   ALBUMIN 3.4 (L) 12/21/2016 1423   AST 19 12/21/2016 1423   ALT 23 12/21/2016 1423   ALKPHOS 119 12/21/2016 1423   BILITOT 0.80 12/21/2016 1423   GFRNONAA >60 11/17/2016 0211   GFRAA >60 11/17/2016 0211    No results found for: SPEP, UPEP  Lab Results  Component Value Date   WBC 5.3 12/21/2016   NEUTROABS 4.4 12/21/2016   HGB 14.5 12/21/2016   HCT 43.7 12/21/2016   MCV 87.9 12/21/2016   PLT 198 12/21/2016      Chemistry      Component Value Date/Time   NA 139 12/21/2016 1423   K 4.3 12/21/2016 1423   CL 99 (L) 11/17/2016 0211   CO2 25 12/21/2016 1423   BUN 17.0 12/21/2016 1423   CREATININE 0.7 12/21/2016 1423      Component Value Date/Time   CALCIUM 9.9 12/21/2016 1423   ALKPHOS 119 12/21/2016 1423   AST 19 12/21/2016 1423   ALT 23 12/21/2016 1423   BILITOT 0.80 12/21/2016 1423       RADIOGRAPHIC STUDIES: I have personally reviewed the radiological images as listed and agreed with the findings in the report. Nm Pet Image Initial (pi) Skull Base To Thigh  Result Date: 11/22/2016 CLINICAL DATA:  Initial treatment strategy for base of tongue cancer. EXAM: NUCLEAR MEDICINE PET SKULL BASE TO THIGH TECHNIQUE: 13.6 mCi F-18 FDG was injected intravenously. Full-ring PET imaging was performed from the skull base to thigh after the radiotracer. CT data was obtained and used for attenuation correction and anatomic localization. FASTING BLOOD GLUCOSE:  Value: 130 mg/dl COMPARISON:  None FINDINGS: NECK Mass within the tongue base slightly right of the midline measures 3 cm and has an SUV max equal to 11.5. Solitary left level-II lymph node measures 1.6 cm and has an SUV max equal to 9.7. Hypermetabolic right level 4 node Measures 5.5 cm and has an SUV max equal to 6.85. Hypermetabolic right level 3 node measures 2.7 cm and has an SUV max equal to 10.10. Hypermetabolic right level-II lymph node measures 2.4 cm and has an SUV  max equal to 10.5.No hypermetabolic lymph nodes in the neck. CHEST The heart size is moderately enlarged. There is aortic atherosclerosis. 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 Calcifications within the RCA, LAD and left circumflex coronary artery noted. No hypermetabolic supraclavicular, axillary, mediastinal or hilar lymph nodes. ABDOMEN/PELVIS No abnormal hypermetabolic activity within the liver, pancreas, adrenal glands, or spleen. Gallstone identified. Aortic atherosclerosis identified. Ventral abdominal wall hernia is identified at the level of the umbilicus. This contains fat only. No hypermetabolic lymph nodes in the abdomen or pelvis. SKELETON No focal hypermetabolic activity to suggest skeletal metastasis. IMPRESSION: 1. Hypermetabolic base of tongue  mass compatible with known neoplasm. 2. 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 3. No evidence for distant metastatic disease 4. Aortic atherosclerosis and coronary artery calcification 5. Gallstones. Electronically Signed   By: Kerby Moors M.D.   On: 11/22/2016 12:07    ASSESSMENT & PLAN:  Malignant neoplasm of base of tongue ([facility]) The patient has expressed concern that the right neck lymphadenopathy has enlarged in size It is possible that sometimes swelling from radiation can contribute to perceived enlarging neck mass I will monitor that carefully Continue to provide supportive care  Thrush, oral He has some residual oral thrush According to his wife, the nystatin swish and swallow was helping If it does not improve over the next 2 days, I have instructed his wife to call me and we might need to prescribe fluconazole  Weight loss, abnormal He has lost a great deal of weight due to altered taste sensation We spent a lot of time discussing options such as soft ache that would be easy for him to swallow I also wrote the prescription to help pay for his nutritional  supplement I warned him about risk of hypoglycemia with poor oral intake while he is on metformin If he cannot eat consistently, I recommend he hold his metformin treatment The patient is also at risk of dehydration His oral fluid intake is minimum I recommend consideration to hold Lasix if he cannot drink adequate amount of oral liquids I also warned him about risk of warfarin toxicity in the setting of poor oral intake Thankfully, PT/INR monitoring today is stable  Chronic systolic CHF (congestive heart failure) ([place]) The patient is at risk of dehydration, in the setting of poor oral intake and Lasix treatment I will monitor him closely Today, he has no significant signs of fluid overload  Type 2 diabetes mellitus ([place]) The patient is at risk of hypoglycemia due to poor oral intake As above, I have instructed his wife to hold metformin if he is not eating consistently   Orders Placed This Encounter  Procedures  . CBC with Differential/Platelet    Standing Status:   Future    Number of Occurrences:   1    Standing Expiration Date:   01/25/2018  . Comprehensive metabolic panel    Standing Status:   Future    Number of Occurrences:   1    Standing Expiration Date:   01/25/2018  . Protime-INR    Standing Status:   Future    Number of Occurrences:   1    Standing Expiration Date:   01/25/2018   All questions were answered. The patient knows to call the clinic with any problems, questions or concerns. No barriers to learning was detected. I spent 25 minutes counseling the patient face to face. The total time spent in the appointment was 30 minutes and more than 50% was on counseling and review of test results     Heath Lark, MD 12/21/2016 4:19 PM

## 2016-12-21 NOTE — Telephone Encounter (Signed)
Appointments scheduled per 4.17.18 LOS. Patient given AVS report and calendars with future scheduled appointments. °

## 2016-12-21 NOTE — Assessment & Plan Note (Signed)
The patient has expressed concern that the right neck lymphadenopathy has enlarged in size It is possible that sometimes swelling from radiation can contribute to perceived enlarging neck mass I will monitor that carefully Continue to provide supportive care

## 2016-12-22 ENCOUNTER — Encounter: Payer: Self-pay | Admitting: Cardiology

## 2016-12-22 ENCOUNTER — Encounter: Payer: Self-pay | Admitting: Nutrition

## 2016-12-22 ENCOUNTER — Ambulatory Visit
Admission: RE | Admit: 2016-12-22 | Discharge: 2016-12-22 | Disposition: A | Discharge status: Home / Self Care | Payer: Medicare HMO | Source: Ambulatory Visit | Attending: Radiation Oncology | Admitting: Radiation Oncology

## 2016-12-22 DIAGNOSIS — I428 Other cardiomyopathies: Secondary | ICD-10-CM | POA: Diagnosis not present

## 2016-12-22 DIAGNOSIS — E119 Type 2 diabetes mellitus without complications: Secondary | ICD-10-CM | POA: Diagnosis not present

## 2016-12-22 DIAGNOSIS — R634 Abnormal weight loss: Secondary | ICD-10-CM | POA: Diagnosis not present

## 2016-12-22 DIAGNOSIS — C01 Malignant neoplasm of base of tongue: Secondary | ICD-10-CM | POA: Diagnosis not present

## 2016-12-22 DIAGNOSIS — Z95810 Presence of automatic (implantable) cardiac defibrillator: Secondary | ICD-10-CM | POA: Diagnosis not present

## 2016-12-22 DIAGNOSIS — I4891 Unspecified atrial fibrillation: Secondary | ICD-10-CM | POA: Diagnosis not present

## 2016-12-22 DIAGNOSIS — Z7901 Long term (current) use of anticoagulants: Secondary | ICD-10-CM | POA: Diagnosis not present

## 2016-12-22 DIAGNOSIS — Z79899 Other long term (current) drug therapy: Secondary | ICD-10-CM | POA: Diagnosis not present

## 2016-12-22 DIAGNOSIS — Z510 Encounter for antineoplastic radiation therapy: Secondary | ICD-10-CM | POA: Diagnosis not present

## 2016-12-23 ENCOUNTER — Ambulatory Visit
Admission: RE | Admit: 2016-12-23 | Discharge: 2016-12-23 | Disposition: A | Discharge status: Home / Self Care | Payer: Medicare HMO | Source: Ambulatory Visit | Attending: Radiation Oncology | Admitting: Radiation Oncology

## 2016-12-23 ENCOUNTER — Other Ambulatory Visit: Payer: Self-pay | Admitting: Radiology

## 2016-12-23 DIAGNOSIS — C01 Malignant neoplasm of base of tongue: Secondary | ICD-10-CM | POA: Diagnosis not present

## 2016-12-23 DIAGNOSIS — E119 Type 2 diabetes mellitus without complications: Secondary | ICD-10-CM | POA: Diagnosis not present

## 2016-12-23 DIAGNOSIS — Z95810 Presence of automatic (implantable) cardiac defibrillator: Secondary | ICD-10-CM | POA: Diagnosis not present

## 2016-12-23 DIAGNOSIS — Z7901 Long term (current) use of anticoagulants: Secondary | ICD-10-CM | POA: Diagnosis not present

## 2016-12-23 DIAGNOSIS — Z510 Encounter for antineoplastic radiation therapy: Secondary | ICD-10-CM | POA: Diagnosis not present

## 2016-12-23 DIAGNOSIS — Z79899 Other long term (current) drug therapy: Secondary | ICD-10-CM | POA: Diagnosis not present

## 2016-12-23 DIAGNOSIS — I428 Other cardiomyopathies: Secondary | ICD-10-CM | POA: Diagnosis not present

## 2016-12-23 DIAGNOSIS — R634 Abnormal weight loss: Secondary | ICD-10-CM | POA: Diagnosis not present

## 2016-12-23 DIAGNOSIS — I4891 Unspecified atrial fibrillation: Secondary | ICD-10-CM | POA: Diagnosis not present

## 2016-12-23 LAB — CUP PACEART REMOTE DEVICE CHECK
Battery Remaining Longevity: 144 mo
Battery Remaining Percentage: 100 %
Brady Statistic RV Percent Paced: 8 %
Date Time Interrogation Session: 20180416060100
HighPow Impedance: 45 Ohm
Implantable Lead Implant Date: 20060209
Implantable Lead Location: 753860
Implantable Lead Model: 158
Implantable Lead Serial Number: 156422
Implantable Pulse Generator Implant Date: 20150904
Lead Channel Impedance Value: 437 Ohm
Lead Channel Pacing Threshold Amplitude: 0.7 V
Lead Channel Pacing Threshold Pulse Width: 0.4 ms
Lead Channel Setting Pacing Amplitude: 2.4 V
Lead Channel Setting Pacing Pulse Width: 0.4 ms
Lead Channel Setting Sensing Sensitivity: 0.6 mV
Pulse Gen Serial Number: 191056

## 2016-12-24 ENCOUNTER — Ambulatory Visit
Admission: RE | Admit: 2016-12-24 | Discharge: 2016-12-24 | Disposition: A | Discharge status: Home / Self Care | Payer: Medicare HMO | Source: Ambulatory Visit | Attending: Radiation Oncology | Admitting: Radiation Oncology

## 2016-12-24 DIAGNOSIS — I4891 Unspecified atrial fibrillation: Secondary | ICD-10-CM | POA: Diagnosis not present

## 2016-12-24 DIAGNOSIS — Z95810 Presence of automatic (implantable) cardiac defibrillator: Secondary | ICD-10-CM | POA: Diagnosis not present

## 2016-12-24 DIAGNOSIS — Z79899 Other long term (current) drug therapy: Secondary | ICD-10-CM | POA: Diagnosis not present

## 2016-12-24 DIAGNOSIS — I428 Other cardiomyopathies: Secondary | ICD-10-CM | POA: Diagnosis not present

## 2016-12-24 DIAGNOSIS — C01 Malignant neoplasm of base of tongue: Secondary | ICD-10-CM | POA: Diagnosis not present

## 2016-12-24 DIAGNOSIS — R634 Abnormal weight loss: Secondary | ICD-10-CM | POA: Diagnosis not present

## 2016-12-24 DIAGNOSIS — Z7901 Long term (current) use of anticoagulants: Secondary | ICD-10-CM | POA: Diagnosis not present

## 2016-12-24 DIAGNOSIS — Z510 Encounter for antineoplastic radiation therapy: Secondary | ICD-10-CM | POA: Diagnosis not present

## 2016-12-24 DIAGNOSIS — E119 Type 2 diabetes mellitus without complications: Secondary | ICD-10-CM | POA: Diagnosis not present

## 2016-12-27 ENCOUNTER — Ambulatory Visit (HOSPITAL_COMMUNITY)
Admission: RE | Admit: 2016-12-27 | Discharge: 2016-12-27 | Disposition: A | Discharge status: Home / Self Care | Payer: Medicare HMO | Source: Ambulatory Visit | Attending: Radiation Oncology | Admitting: Radiation Oncology

## 2016-12-27 ENCOUNTER — Encounter (HOSPITAL_COMMUNITY): Payer: Self-pay

## 2016-12-27 ENCOUNTER — Ambulatory Visit
Admission: RE | Admit: 2016-12-27 | Discharge: 2016-12-27 | Disposition: A | Discharge status: Home / Self Care | Payer: Medicare HMO | Source: Ambulatory Visit | Attending: Radiation Oncology | Admitting: Radiation Oncology

## 2016-12-27 ENCOUNTER — Encounter: Payer: Self-pay | Admitting: *Deleted

## 2016-12-27 ENCOUNTER — Other Ambulatory Visit: Payer: Self-pay | Admitting: Radiation Oncology

## 2016-12-27 ENCOUNTER — Encounter (HOSPITAL_COMMUNITY)
Admission: RE | Admit: 2016-12-27 | Discharge: 2016-12-27 | Disposition: A | Discharge status: Home / Self Care | Payer: Medicare HMO | Source: Ambulatory Visit | Attending: Radiation Oncology | Admitting: Radiation Oncology

## 2016-12-27 DIAGNOSIS — E119 Type 2 diabetes mellitus without complications: Secondary | ICD-10-CM | POA: Insufficient documentation

## 2016-12-27 DIAGNOSIS — R634 Abnormal weight loss: Secondary | ICD-10-CM | POA: Diagnosis not present

## 2016-12-27 DIAGNOSIS — E785 Hyperlipidemia, unspecified: Secondary | ICD-10-CM | POA: Insufficient documentation

## 2016-12-27 DIAGNOSIS — C01 Malignant neoplasm of base of tongue: Secondary | ICD-10-CM

## 2016-12-27 DIAGNOSIS — I4891 Unspecified atrial fibrillation: Secondary | ICD-10-CM | POA: Diagnosis not present

## 2016-12-27 DIAGNOSIS — Z7901 Long term (current) use of anticoagulants: Secondary | ICD-10-CM | POA: Diagnosis not present

## 2016-12-27 DIAGNOSIS — I428 Other cardiomyopathies: Secondary | ICD-10-CM | POA: Diagnosis not present

## 2016-12-27 DIAGNOSIS — G4733 Obstructive sleep apnea (adult) (pediatric): Secondary | ICD-10-CM | POA: Insufficient documentation

## 2016-12-27 DIAGNOSIS — C140 Malignant neoplasm of pharynx, unspecified: Secondary | ICD-10-CM | POA: Insufficient documentation

## 2016-12-27 DIAGNOSIS — Z7984 Long term (current) use of oral hypoglycemic drugs: Secondary | ICD-10-CM | POA: Insufficient documentation

## 2016-12-27 DIAGNOSIS — I429 Cardiomyopathy, unspecified: Secondary | ICD-10-CM | POA: Insufficient documentation

## 2016-12-27 DIAGNOSIS — Z95810 Presence of automatic (implantable) cardiac defibrillator: Secondary | ICD-10-CM | POA: Diagnosis not present

## 2016-12-27 DIAGNOSIS — C029 Malignant neoplasm of tongue, unspecified: Secondary | ICD-10-CM | POA: Diagnosis not present

## 2016-12-27 DIAGNOSIS — Z510 Encounter for antineoplastic radiation therapy: Secondary | ICD-10-CM | POA: Diagnosis not present

## 2016-12-27 DIAGNOSIS — Z79899 Other long term (current) drug therapy: Secondary | ICD-10-CM | POA: Diagnosis not present

## 2016-12-27 HISTORY — PX: IR GASTROSTOMY TUBE MOD SED: IMG625

## 2016-12-27 LAB — CBC WITH DIFFERENTIAL/PLATELET
Basophils Absolute: 0 10*3/uL (ref 0.0–0.1)
Basophils Relative: 0 %
Eosinophils Absolute: 0 10*3/uL (ref 0.0–0.7)
Eosinophils Relative: 1 %
HCT: 39.8 % (ref 39.0–52.0)
Hemoglobin: 13.3 g/dL (ref 13.0–17.0)
Lymphocytes Relative: 11 %
Lymphs Abs: 0.5 10*3/uL — ABNORMAL LOW (ref 0.7–4.0)
MCH: 28.9 pg (ref 26.0–34.0)
MCHC: 33.4 g/dL (ref 30.0–36.0)
MCV: 86.3 fL (ref 78.0–100.0)
Monocytes Absolute: 0.3 10*3/uL (ref 0.1–1.0)
Monocytes Relative: 6 %
Neutro Abs: 3.6 10*3/uL (ref 1.7–7.7)
Neutrophils Relative %: 82 %
Platelets: 227 10*3/uL (ref 150–400)
RBC: 4.61 MIL/uL (ref 4.22–5.81)
RDW: 13.8 % (ref 11.5–15.5)
WBC: 4.3 10*3/uL (ref 4.0–10.5)

## 2016-12-27 LAB — BASIC METABOLIC PANEL
Anion gap: 10 (ref 5–15)
BUN: 15 mg/dL (ref 6–20)
CO2: 28 mmol/L (ref 22–32)
Calcium: 9.3 mg/dL (ref 8.9–10.3)
Chloride: 99 mmol/L — ABNORMAL LOW (ref 101–111)
Creatinine, Ser: 0.71 mg/dL (ref 0.61–1.24)
GFR calc Af Amer: >60 mL/min (ref >60)
GFR calc non Af Amer: >60 mL/min (ref >60)
Glucose, Bld: 129 mg/dL — ABNORMAL HIGH (ref 65–99)
Potassium: 4.1 mmol/L (ref 3.5–5.1)
Sodium: 137 mmol/L (ref 135–145)

## 2016-12-27 LAB — PROTIME-INR
INR: 1.7
Prothrombin Time: 20.2 seconds — ABNORMAL HIGH (ref 11.4–15.2)

## 2016-12-27 LAB — GLUCOSE, CAPILLARY: Glucose-Capillary: 136 mg/dL — ABNORMAL HIGH (ref 65–99)

## 2016-12-27 MED ORDER — FENTANYL CITRATE (PF) 100 MCG/2ML IJ SOLN
INTRAMUSCULAR | Status: AC | PRN
  Administered 2016-12-27 (×2): 50 ug via INTRAVENOUS

## 2016-12-27 MED ORDER — IOPAMIDOL (ISOVUE-300) INJECTION 61%
50.0000 mL | Freq: Once | INTRAVENOUS | Status: AC | PRN
  Administered 2016-12-27: 10 mL

## 2016-12-27 MED ORDER — CEFAZOLIN SODIUM-DEXTROSE 2-4 GM/100ML-% IV SOLN
2.0000 g | INTRAVENOUS | Status: AC
  Administered 2016-12-27: 2 g via INTRAVENOUS

## 2016-12-27 MED ORDER — LIDOCAINE HCL 1 % IJ SOLN
INTRAMUSCULAR | Status: AC
  Filled 2016-12-27: qty 20

## 2016-12-27 MED ORDER — CEFAZOLIN SODIUM-DEXTROSE 2-4 GM/100ML-% IV SOLN
INTRAVENOUS | Status: AC
  Filled 2016-12-27: qty 100

## 2016-12-27 MED ORDER — IOPAMIDOL (ISOVUE-300) INJECTION 61%
INTRAVENOUS | Status: AC
  Administered 2016-12-27: 10 mL
  Filled 2016-12-27: qty 50

## 2016-12-27 MED ORDER — MIDAZOLAM HCL 2 MG/2ML IJ SOLN
INTRAMUSCULAR | Status: AC | PRN
  Administered 2016-12-27 (×2): 1 mg via INTRAVENOUS

## 2016-12-27 MED ORDER — SODIUM CHLORIDE 0.9 % IV SOLN
INTRAVENOUS | Status: DC
  Administered 2016-12-27: 12:00:00 via INTRAVENOUS

## 2016-12-27 MED ORDER — GLUCAGON HCL RDNA (DIAGNOSTIC) 1 MG IJ SOLR
INTRAMUSCULAR | Status: AC
  Filled 2016-12-27: qty 1

## 2016-12-27 MED ORDER — FENTANYL CITRATE (PF) 100 MCG/2ML IJ SOLN
INTRAMUSCULAR | Status: AC
  Filled 2016-12-27: qty 2

## 2016-12-27 MED ORDER — LIDOCAINE HCL 1 % IJ SOLN
INTRAMUSCULAR | Status: AC | PRN
  Administered 2016-12-27: 10 mL via INTRADERMAL

## 2016-12-27 MED ORDER — GLUCAGON HCL (RDNA) 1 MG IJ SOLR
INTRAMUSCULAR | Status: AC | PRN
  Administered 2016-12-27: 1 mg via INTRAVENOUS

## 2016-12-27 MED ORDER — MIDAZOLAM HCL 2 MG/2ML IJ SOLN
INTRAMUSCULAR | Status: AC
  Filled 2016-12-27: qty 2

## 2016-12-27 NOTE — Progress Notes (Signed)
Oncology Nurse Navigator Documentation Met with Mr. Brentlinger and his wife following today's RT and PUT to provide PEG education.  He is having PEG placed later this morning.  Using  PEG teaching device   and Teach Back, provided education for PEG use and care, including: hand hygiene, gravity bolus administration of daily water flushes, nutritional supplement, fluids and medications; care of tube insertion site including daily dressing change and cleaning; S&S of infection.  They correctly verbalized dressing change and cleaning procedures, provided correct return demonstration of dressing change and gravity administration of water.  I provided written instructions for PEG flushing/dressing change in support of verbal instruction.  They understand I will be available for ongoing PEG educational support.  I described contents of PEG starter kit provided by [facility] (6 syringes, ca 20 packets 4x4 split gauze, 3 rolls paper tape, bag of gloves, case of Osmolite 1.5). They understand kit will be delivered to [facility] this afternoon or [place] in which case I will direct to them in conjunction with an RT appt.  They understand to call me with questions/concerns.  Gayleen Orem, RN, BSN, [place] [facility] Oncology Nurse [place] at [place] [phone]

## 2016-12-27 NOTE — Sedation Documentation (Signed)
Patient denies pain and is resting comfortably.  

## 2016-12-27 NOTE — Consult Note (Signed)
Chief Complaint: Patient was seen in consultation today for odynophagia  Referring Physician(s):  Squire,Sarah  Supervising Physician: Marybelle Killings  Patient Status: [facility] - Out-pt  History of Present Illness: Collin [name] is a 76 y.o. 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  Patient has had difficulty eating and drinking at home with weight loss.   IR consulted for percutaneous gastrostomy tube placement at the request of Dr. Isidore Moos.  Most recent CT Abd/Pelvis was on 11/09/16 and was obtained in the setting of small bowel obstruction.   Patient drank barium last evening around 6pm.  He has been NPO.  He does take coumadin for a fib, but has held for the past 4-5 days.  He has been in his usual state of health. He has been taking Miralax since discharge.    Past Medical History:  Diagnosis Date  . Atrial fibrillation ([place])   . Cardiomyopathy, nonischemic ([place]) 1998  . Diabetes mellitus   . Heart disease   . Hyperlipidemia   . OSA (obstructive sleep apnea)   . Shingles   . 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 It sometimes shows as a shadow on scans.     Past Surgical History:  Procedure Laterality Date  . CARDIAC DEFIBRILLATOR PLACEMENT  2006  . CATARACT EXTRACTION Bilateral    01/07/2016. 01/22/2016  . CYST EXCISION     multiple drainage to a cyst in his neck.   Marland Kitchen DIRECT LARYNGOSCOPY N/A 11/16/2016   Procedure: DIRECT LARYNGOSCOPY AND BIOPSY;  Surgeon: Rozetta Nunnery, MD;  Location: [place];  Service: ENT;  Laterality: N/A;  . HERNIA REPAIR     1950's  . IMPLANTABLE CARDIOVERTER DEFIBRILLATOR (ICD) GENERATOR CHANGE N/A 05/10/2014   Procedure: ICD GENERATOR CHANGE;  Surgeon: [name], MD;  Location: [facility] CATH LAB;  Service: Cardiovascular;  Laterality: N/A;  . MASS EXCISION Right 11/16/2016   Procedure:  EXCISION RIGHT NECK LIPOMA;  Surgeon: Rozetta Nunnery, MD;  Location: [place];  Service: ENT;  Laterality: Right;  . MOHS SURGERY  01/01/2015   to top of head  . MOUTH SURGERY     teeth removed 4 teeth extracted 02/2010    Allergies: Niaspan [niacin er]  Medications: Prior to Admission medications   Medication Sig Start Date End Date Taking? Authorizing Provider  carvedilol (COREG) 25 MG tablet TAKE 1 TABLET TWICE DAILY WITH MEALS 06/29/16  Yes Eulas Post, MD  digoxin (LANOXIN) 0.125 MG tablet Take 1 tablet (0.125 mg total) by mouth daily. 09/17/16  Yes Fay Records, MD  diltiazem (CARDIZEM) 90 MG tablet Take 1 tablet (90 mg total) by mouth daily. 11/17/16  Yes Ripudeep Krystal Eaton, MD  nystatin (MYCOSTATIN) 100000 UNIT/ML suspension Take 5 mLs (500,000 Units total) by mouth 4 (four) times daily. Swish in mouth for a minute, then swallow. Do this for a total of 3 weeks. 12/20/16  Yes Eppie Gibson, MD  polyethylene glycol [facility]) packet Take 17 g by mouth daily as needed. Available OTC 11/17/16  Yes Ripudeep Krystal Eaton, MD  pravastatin (PRAVACHOL) 40 MG tablet TAKE 1 TABLET EVERY DAY 10/26/16  Yes Eulas Post, MD  sodium fluoride (PREVIDENT 5000 PLUS) 1.1 % CREA dental cream Apply gel to tooth brush. Brush teeth for 2 minutes. Spit out excess-DO NOT swallow. Repeat nightly. 12/16/16  Yes Lenn Cal, DDS  warfarin (COUMADIN) 5 MG tablet Take as directed by anticoagulation clinic. Patient taking differently: Take 5-7.5 mg by mouth daily. Take 5mg  daily except take 7.5mg  on Monday. 06/08/16  Yes Eulas Post, MD  acetaminophen (TYLENOL) 500 MG tablet Take 1,000 mg by mouth every 6 (six) hours as needed for moderate pain.    Historical Provider, MD  furosemide (LASIX) 20 MG tablet Take 1 tablet (20 mg total) by mouth daily. 11/17/16   Ripudeep Krystal Eaton, MD  lidocaine (XYLOCAINE) 2 % solution Patient: Mix 1part 2% viscous lidocaine, 1part H20. Swish and swallow 75mL of this mixture, 24min  before meals and at bedtime, up to QID 12/13/16   Eppie Gibson, MD  metFORMIN (GLUCOPHAGE) 500 MG tablet TAKE 1 TABLET TWICE DAILY WITH MEALS 11/29/16   Eulas Post, MD  ondansetron (ZOFRAN ODT) 4 MG disintegrating tablet Take 1 tablet (4 mg total) by mouth every 8 (eight) hours as needed for nausea or vomiting. 11/17/16   Ripudeep Krystal Eaton, MD     Family History  Problem Relation Age of Onset  . Heart disease Mother   . Sudden death Mother     under age 30  . Lupus Mother   . Hypertension Father   . Stroke Father     Social History   Social History  . Marital status: Married    Spouse name: [name]  . Number of children: 3  . Years of education: N/A   Occupational History  . Retired Animal nutritionist    Social History Main Topics  . Smoking status: Never Smoker  . Smokeless tobacco: Never Used  . Alcohol use Yes     Comment: he has a past history of social drinking  . Drug use: No  . Sexual activity: Not Asked   Other Topics Concern  . None   Social History Narrative  . None   Review of Systems  Constitutional: Negative for fatigue and fever.  Respiratory: Negative for cough and shortness of breath.   Cardiovascular: Negative for chest pain.  Gastrointestinal: Negative for abdominal pain.  Psychiatric/Behavioral: Negative for behavioral problems and confusion.    Vital Signs: BP 117/75 (BP Location: Left Arm)   Pulse 92   Temp 98 F (36.7 C) (Oral)   Resp 18   SpO2 99%   Physical Exam  Constitutional: He is oriented to person, place, and time. He appears well-developed.  Cardiovascular: Normal rate and regular rhythm.   Pulmonary/Chest: Effort normal and breath sounds normal.  Abdominal: Soft. A hernia (umbilical) is present.  Neurological: He is alert and oriented to person, place, and time.  Skin: Skin is warm and dry.  Psychiatric: He has a normal mood and affect. His behavior is normal. Judgment and thought content normal.  Nursing note and vitals  reviewed.   Mallampati Score:  MD Evaluation Airway: WNL Heart: WNL Abdomen: WNL Chest/ Lungs: WNL ASA  Classification: 3 Mallampati/Airway Score: Two  Imaging: No results found.  Labs:  CBC:  Recent Labs  11/16/16 0212 11/17/16 0211 12/21/16 1422 12/27/16 1137  WBC 4.1 5.8 5.3 4.3  HGB 13.8 13.8 14.5 13.3  HCT 42.8 42.6 43.7 39.8  PLT 170 214 198 227    COAGS:  Recent Labs  11/16/16 0212 11/17/16 0211 12/21/16 1422 12/27/16 1137  INR 1.38 1.28 2.50 1.70    BMP:  Recent Labs  11/15/16 0304 11/16/16 0212 11/17/16 0211 12/21/16 1423 12/27/16 1137  NA 137 138 136  139 137  K 4.1 3.9 4.4 4.3 4.1  CL 102 101 99*  --  99*  CO2 26 27 30 25 28   GLUCOSE 119* 115* 159* 106 129*  BUN 11 16 19  17.0 15  CALCIUM 8.9 9.0 9.1 9.9 9.3  CREATININE 0.71 0.80 0.96 0.7 0.71  GFRNONAA >60 >60 >60  --  >60  GFRAA >60 >60 >60  --  >60    LIVER FUNCTION TESTS:  Recent Labs  11/09/16 0350 11/10/16 2148 11/11/16 0356 12/21/16 1423  BILITOT 0.7 1.1 0.9 0.80  AST 25 20 18 19   ALT 25 23 20 23   ALKPHOS 91 84 72 119  PROT 7.7 8.3* 7.0 7.9  ALBUMIN 4.0 4.0 3.6 3.4*    TUMOR MARKERS: No results for input(s): AFPTM, CEA, CA199, CHROMGRNA in the last 8760 hours.  Assessment and Plan: Patient with history of malignant neoplasm of the tongue presents with complain of odynophagia since starting radiation treatment.  Patient has not been able to eat adequately PO and has been losing weight.  Patient presents for gastrostomy tube placement.   He took barium last evening at 6pm.  He has otherwise been NPO.  He has been in his usual state of health.  He did stop taking coumadin 4-5 days ago, but his INR is still slightly elevated at 1.7.  Risks and benefits discussed with the patient including, but not limited to the need for a barium enema during the procedure, bleeding, infection, peritonitis, or damage to adjacent structures. All of the patient's questions were  answered, patient is agreeable to proceed. Consent signed and in chart.  Thank you for this interesting consult.  I greatly enjoyed meeting CYLAN BORUM and look forward to participating in their care.  A copy of this report was sent to the requesting provider on this date.  Electronically Signed: Docia Barrier 12/27/2016, 1:23 PM   I spent a total of  30 Minutes   in face to face in clinical consultation, greater than 50% of which was counseling/coordinating care for odynophagia.

## 2016-12-27 NOTE — Discharge Instructions (Signed)
Moderate Conscious Sedation, Adult, Care After These instructions provide you with information about caring for yourself after your procedure. Your health care provider may also give you more specific instructions. Your treatment has been planned according to current medical practices, but problems sometimes occur. Call your health care provider if you have any problems or questions after your procedure. What can I expect after the procedure? After your procedure, it is common:  To feel sleepy for several hours.  To feel clumsy and have poor balance for several hours.  To have poor judgment for several hours.  To vomit if you eat too soon. Follow these instructions at home: For at least 24 hours after the procedure:    Do not:  Participate in activities where you could fall or become injured.  Drive.  Use heavy machinery.  Drink alcohol.  Take sleeping pills or medicines that cause drowsiness.  Make important decisions or sign legal documents.  Take care of children on your own.  Rest. Eating and drinking   Follow the diet recommended by your health care provider.  If you vomit:  Drink water, juice, or soup when you can drink without vomiting.  Make sure you have little or no nausea before eating solid foods. General instructions   Have a responsible adult stay with you until you are awake and alert.  Take over-the-counter and prescription medicines only as told by your health care provider.  If you smoke, do not smoke without supervision.  Keep all follow-up visits as told by your health care provider. This is important. Contact a health care provider if:  You keep feeling nauseous or you keep vomiting.  You feel light-headed.  You develop a rash.  You have a fever. Get help right away if:  You have trouble breathing. This information is not intended to replace advice given to you by your health care provider. Make sure you discuss any questions you  have with your health care provider. Document Released: 06/13/2013 Document Revised: 01/26/2016 Document Reviewed: 12/13/2015 Elsevier Interactive Patient Education  2017 [employer].   Gastrostomy Tube Home Guide, Adult A gastrostomy tube is a tube that is surgically placed into the stomach. It is also called a G-tube. G-tubes are used when a person is unable to eat and drink enough on their own to stay healthy. The tube is inserted into the stomach through a small cut (incision) in the skin. This tube is used for:  Feeding.  Giving medication. Gastrostomy tube care  Wash your hands with soap and water.  Remove the old dressing (if any). Some styles of G-tubes may need a dressing inserted between the skin and the G-tube. Other types of G-tubes do not require a dressing. Ask your health care provider if a dressing is needed.  Check the area where the tube enters the skin (insertion site) for redness, swelling, or pus-like (purulent) drainage. A small amount of clear or tan liquid drainage is normal. Check to make sure scar tissue (skin) is not growing around the insertion site. This could have a raised, bumpy appearance.  A cotton swab can be used to clean the skin around the tube:  When the G-tube is first put in, a normal saline solution or water can be used to clean the skin.  Mild soap and warm water can be used when the skin around the G-tube site has healed.  Roll the cotton swab around the G-tube insertion site to remove any drainage or crusting at the insertion  site. Stomach residuals Feeding tube residuals are the amount of liquids that are in the stomach at any given time. Residuals may be checked before giving feedings, medications, or as instructed by your health care provider.  Ask your health care provider if there are instances when you would not start tube feedings depending on the amount or type of contents withdrawn from the stomach.  Check residuals by attaching  a syringe to the G-tube and pulling back on the syringe plunger. Note the amount, and return the residual back into the stomach. Flushing the G-tube  The G-tube should be periodically flushed with clean warm water to keep it from clogging.  Flush the G-tube after feedings or medications. Draw up 30 mL of warm water in a syringe. Connect the syringe to the G-tube and slowly push the water into the tube.  Do not push feedings, medications, or flushes rapidly. Flush the G-tube gently and slowly.  Only use syringes made for G-tubes to flush medications or feedings.  Your health care provider may want the G-tube flushed more often or with more water. If this is the case, follow your health care provider's instructions. Feedings Your health care provider will determine whether feedings are given as a bolus (a certain amount given at one time and at scheduled times) or whether feedings will be given continuously on a feeding pump.  Formulas should be given at room temperature.  If feedings are continuous, no more than 4 hours worth of feedings should be placed in the feeding bag. This helps prevent spoilage or accidental excess infusion.  Cover and place unused formula in the refrigerator.  If feedings are continuous, stop the feedings when medications or flushes are given. Be sure to restart the feedings.  Feeding bags and syringes should be replaced as instructed by your health care provider. Giving medication  In general, it is best if all medications are in a liquid form for G-tube administration. Liquid medications are less likely to clog the G-tube.  Mix the liquid medication with 30 mL (or amount recommended by your health care provider) of warm water.  Draw up the medication into the syringe.  Attach the syringe to the G-tube and slowly push the mixture into the G-tube.  After giving the medication, draw up 30 mL of warm water in the syringe and slowly flush the G-tube.  For  pills or capsules, check with your health care provider first before crushing medications. Some pills are not effective if they are crushed. Some capsules are sustained-release medications.  If appropriate, crush the pill or capsule and mix with 30 mL of warm water. Using the syringe, slowly push the medication through the tube, then flush the tube with another 30 mL of tap water. G-tube problems G-tube was pulled out.  Cause: May have been pulled out accidentally.  Solutions: Cover the opening with clean dressing and tape. Call your health care provider right away. The G-tube should be put in as soon as possible (within 4 hours) so the G-tube opening (tract) does not close. The G-tube needs to be put in at a health care setting. An X-ray needs to be done to confirm placement before the G-tube can be used again. Redness, irritation, soreness, or foul odor around the gastrostomy site.  Cause: May be caused by leakage or infection.  Solutions: Call your health care provider right away. Large amount of leakage of fluid or mucus-like liquid present (a large amount means it soaks clothing).  Cause: Many  reasons could cause the G-tube to leak.  Solutions: Call your health care provider to discuss the amount of leakage. Skin or scar tissue appears to be growing where tube enters skin.  Cause: Tissue growth may develop around the insertion site if the G-tube is moved or pulled on excessively.  Solutions: Secure tube with tape so that excess movement does not occur. Call your health care provider. G-tube is clogged.  Cause: Thick formula or medication.  Solutions: Try to slowly push warm water into the tube with a large syringe. Never try to push any object into the tube to unclog it. Do not force fluid into the G-tube. If you are unable to unclog the tube, call your health care provider right away. Tips  Head of bed (HOB) position refers to the upright position of a person's upper  body.  When giving medications or a feeding bolus, keep the [facility] up as told by your health care provider. Do this during the feeding and for 1 hour after the feeding or medication administration.  If continuous feedings are being given, it is best to keep the [facility] up as told by your health care provider. When ADLs (activities of daily living) are performed and the [facility] needs to be flat, be sure to turn the feeding pump off. Restart the feeding pump when the [facility] is returned to the recommended height.  Do not pull or put tension on the tube.  To prevent fluid backflow, kink the G-tube before removing the cap or disconnecting a syringe.  Check the G-tube length every day. Measure from the insertion site to the end of the G-tube. If the length is longer than previous measurements, the tube may be coming out. Call your health care provider if you notice increasing G-tube length.  Oral care, such as brushing teeth, must be continued.  You may need to remove excess air (vent) from the G-tube. Your health care provider will tell you if this is needed.  Always call your health care provider if you have questions or problems with the G-tube. Get help right away if:  You have severe abdominal pain, tenderness, or abdominal bloating (distension).  You have nausea or vomiting.  You are constipated or have problems moving your bowels.  The G-tube insertion site is red, swollen, has a foul smell, or has yellow or brown drainage.  You have difficulty breathing or shortness of breath.  You have a fever.  You have a large amount of feeding tube residuals.  The G-tube is clogged and cannot be flushed. This information is not intended to replace advice given to you by your health care provider. Make sure you discuss any questions you have with your health care provider. Document Released: 11/01/2001 Document Revised: 01/29/2016 Document Reviewed: 04/30/2013 Elsevier Interactive Patient Education  2017  [employer]   DIET:  Only ice chips by mouth until 7 pm tonight then may begin clear thin liquid diet after 7 pm tonight.   Clear Liquid Diet A clear liquid diet means that you only have liquids that you can see through. You do not eat any food on this diet. Most people need to follow this diet for only a short time. What do I need to know about this diet?  A clear liquid is a liquid that you can see through when you hold it up to a light.  This diet does not give you all the nutrients that you need. Choose a variety of the liquids that your  doctor says you can drink on this diet. That way, you will get as many nutrients as possible.  If you are not sure whether you can have certain items, ask your doctor. What can I have?  Water and flavored water.  Fruit juices that do not have pulp, such as cranberry juice and apple juice.  Tea and coffee without milk or cream.  Clear bouillon or broth.  Broth-based soups that have been strained.  Flavored gelatins.  Honey.  Sugar water.  Frozen ice or frozen ice pops that do not have any milk, yogurt, fruit pieces, or fruit pulp in them.  Clear sodas.  Clear sports drinks. The items listed above may not be a complete list of recommended liquids. Contact your food and nutrition expert (dietitian) for more options.  What can I not have?  Juices that have pulp.  Milk.  Cream or cream-based soups.  Yogurt. The items listed above may not be a complete list of liquids to avoid. Contact your food and nutrition expert for more information.  Summary  A clear liquid diet is a diet that includes only liquids that you can see through.  The goal of this diet is to help you recover.  Make sure to avoid liquids with milk, cream, or pulp while you are on this diet. This information is not intended to replace advice given to you by your health care provider. Make sure you discuss any questions you have with your health care  provider. Document Released: 08/05/2008 Document Revised: 04/05/2016 Document Reviewed: 07/20/2013 Elsevier Interactive Patient Education  2017 [employer].

## 2016-12-27 NOTE — Procedures (Signed)
20 Fr pull through G tube EBL 0 Comp 0 

## 2016-12-28 ENCOUNTER — Encounter: Payer: Self-pay | Admitting: Hematology and Oncology

## 2016-12-28 ENCOUNTER — Ambulatory Visit (HOSPITAL_BASED_OUTPATIENT_CLINIC_OR_DEPARTMENT_OTHER): Payer: Medicare HMO | Admitting: Hematology and Oncology

## 2016-12-28 ENCOUNTER — Ambulatory Visit: Payer: Medicare HMO

## 2016-12-28 ENCOUNTER — Ambulatory Visit
Admission: RE | Admit: 2016-12-28 | Discharge: 2016-12-28 | Disposition: A | Discharge status: Home / Self Care | Payer: Medicare HMO | Source: Ambulatory Visit | Attending: Radiation Oncology | Admitting: Radiation Oncology

## 2016-12-28 DIAGNOSIS — E119 Type 2 diabetes mellitus without complications: Secondary | ICD-10-CM | POA: Diagnosis not present

## 2016-12-28 DIAGNOSIS — R634 Abnormal weight loss: Secondary | ICD-10-CM

## 2016-12-28 DIAGNOSIS — C01 Malignant neoplasm of base of tongue: Secondary | ICD-10-CM | POA: Diagnosis not present

## 2016-12-28 DIAGNOSIS — E118 Type 2 diabetes mellitus with unspecified complications: Secondary | ICD-10-CM | POA: Diagnosis not present

## 2016-12-28 DIAGNOSIS — I5022 Chronic systolic (congestive) heart failure: Secondary | ICD-10-CM

## 2016-12-28 DIAGNOSIS — Z79899 Other long term (current) drug therapy: Secondary | ICD-10-CM | POA: Diagnosis not present

## 2016-12-28 DIAGNOSIS — Z95810 Presence of automatic (implantable) cardiac defibrillator: Secondary | ICD-10-CM | POA: Diagnosis not present

## 2016-12-28 DIAGNOSIS — I428 Other cardiomyopathies: Secondary | ICD-10-CM | POA: Diagnosis not present

## 2016-12-28 DIAGNOSIS — C140 Malignant neoplasm of pharynx, unspecified: Secondary | ICD-10-CM | POA: Diagnosis not present

## 2016-12-28 DIAGNOSIS — Z7901 Long term (current) use of anticoagulants: Secondary | ICD-10-CM | POA: Diagnosis not present

## 2016-12-28 DIAGNOSIS — I4891 Unspecified atrial fibrillation: Secondary | ICD-10-CM | POA: Diagnosis not present

## 2016-12-28 DIAGNOSIS — Z510 Encounter for antineoplastic radiation therapy: Secondary | ICD-10-CM | POA: Diagnosis not present

## 2016-12-28 MED ORDER — PROMOD PO LIQD: ORAL | Status: DC

## 2016-12-28 MED ORDER — OSMOLITE 1.5 CAL PO LIQD: ORAL | 0 refills | Status: DC

## 2016-12-28 NOTE — Progress Notes (Signed)
[facility] OFFICE PROGRESS NOTE  Patient Care Team: Eulas Post, MD as PCP - General (Family Medicine) Eppie Gibson, MD as Attending Physician (Radiation Oncology) Heath Lark, MD as Consulting Physician (Hematology and Oncology) Leota Sauers, RN as Oncology Nurse [place], [place] as Dietitian (Nutrition) Jomarie Longs, PT as Physical Therapist (Physical Therapy) Sharen Counter, CCC-SLP as Speech Language Pathologist (Speech Pathology) Kennith Center, LCSW as Social Worker Rozetta Nunnery, MD as Consulting Physician (Otolaryngology)  SUMMARY OF ONCOLOGIC HISTORY:   Malignant neoplasm of base of tongue ([place])   11/08/2016 Pathology Results    INSUFFICIENT FOR DIAGNOSIS. The Specimen Is Processed And Examined Microscopically, But Found To Be UNSATISFACTORY For Evaluation. Diagnosis NONDIAGNOSTIC MATERIAL      11/09/2016 Imaging    CT abdomen: Umbilical hernia containing only peritoneal fat. No bowel obstruction. Chronic cholelithiasis. A gallstone is present on the prior study of 06/10/2014. 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      11/10/2016 Imaging    CT neck: Right base of tongue mass likely representing carcinoma. The mass measures up to 37 mm. Visible mass invades base of tongue 27 mm and extends 9 mm to contralateral base of tongue. 2. Right level 2, 3, 4, 5a/b necrotic lymphadenopathy with findings of extra nodal extension. Left level 2 lymphadenopathy      11/10/2016 - 11/17/2016 Hospital Admission    He was admitted for management of bowel obstruction, managed conservatively      11/16/2016 Pathology Results    1. Tongue, biopsy, Base - SQUAMOUS CELL CARCINOMA. - SEE MICROSCOPIC DESCRIPTION 2. Soft tissue, lipoma, Right Neck - BENIGN ADIPOSE TISSUE CONSISTENT WITH LIPOMA. - NO EVIDENCE OF MALIGNANCY. Microscopic Comment 1. Immunohistochemistry for p16 will be performed and reported as an  addendum. Addendum: Immunohistochemistry for p16 (HPV) shows diffuse strong positivity in the tumor.       11/16/2016 Surgery    OPERATION PERFORMED:  Direct laryngoscopy and biopsy of base of tongue mass (frozen section positive for squamous cell carcinoma).  Excision of large right neck lipoma, 10 x 12 cm in size.  SURGEON:  Leonides Sake. Lucia Gaskins, MD.  DESCRIPTION OF PROCEDURE:  Oral cavity was clear.  Base of tongue had a partially exophytic mass extending down to the vallecula.  The epiglottis was uninvolved.  On direct laryngoscopy of the laryngeal surface, epiglottis was clear.  AE folds were clear.  Vocal cords were clear.  Piriform sinuses were clear. Next, biopsies were obtained from the base of tongue and sent for frozen section.  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        11/22/2016 PET scan    Hypermetabolic base of tongue mass compatible with known neoplasm. 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 No evidence for distant metastatic disease. Aortic atherosclerosis and coronary artery calcification. Gallstones.      12/07/2016 -  Radiation Therapy    The patient has received radiation treatment        INTERVAL HISTORY: Please see below for problem oriented charting. He is seen with his wife He has lost some weight However, he is eating better He denies pain. He is able to tolerate soft diet No recent dysphagia. No recent chest pain or shortness of breath The patient denies any recent signs or symptoms of bleeding such as spontaneous epistaxis, hematuria or hematochezia. No recent hypoglycemic episodes He had successful placement of PEG tube.  No  complications.  REVIEW OF SYSTEMS:   Eyes: Denies blurriness of vision Ears, nose, mouth, throat, and face: Denies mucositis or sore throat Respiratory: Denies cough, dyspnea or wheezes Cardiovascular: Denies palpitation, chest discomfort or lower  extremity swelling Gastrointestinal:  Denies nausea, heartburn or change in bowel habits Skin: Denies abnormal skin rashes Lymphatics: Denies new lymphadenopathy or easy bruising Neurological:Denies numbness, tingling or new weaknesses Behavioral/Psych: Mood is stable, no new changes  All other systems were reviewed with the patient and are negative.  I have reviewed the past medical history, past surgical history, social history and family history with the patient and they are unchanged from previous note.  ALLERGIES:  is allergic to niaspan [niacin er].  MEDICATIONS:  Current Outpatient Prescriptions  Medication Sig Dispense Refill  . acetaminophen (TYLENOL) 500 MG tablet Take 1,000 mg by mouth every 6 (six) hours as needed for moderate pain.    . carvedilol (COREG) 25 MG tablet TAKE 1 TABLET TWICE DAILY WITH MEALS 180 tablet 1  . digoxin (LANOXIN) 0.125 MG tablet Take 1 tablet (0.125 mg total) by mouth daily. 90 tablet 3  . diltiazem (CARDIZEM) 90 MG tablet Take 1 tablet (90 mg total) by mouth daily. 30 tablet 3  . nystatin (MYCOSTATIN) 100000 UNIT/ML suspension Take 5 mLs (500,000 Units total) by mouth 4 (four) times daily. Swish in mouth for a minute, then swallow. Do this for a total of 3 weeks. 420 mL 0  . polyethylene glycol (MIRALAX) packet Take 17 g by mouth daily as needed. Available OTC 30 each 0  . sodium fluoride (PREVIDENT 5000 PLUS) 1.1 % CREA dental cream Apply gel to tooth brush. Brush teeth for 2 minutes. Spit out excess-DO NOT swallow. Repeat nightly. 1 Tube prn  . furosemide (LASIX) 20 MG tablet Take 1 tablet (20 mg total) by mouth daily. (Patient not taking: Reported on 12/28/2016) 30 tablet 3  . lidocaine (XYLOCAINE) 2 % solution Patient: Mix 1part 2% viscous lidocaine, 1part H20. Swish and swallow 59mL of this mixture, 30min before meals and at bedtime, up to QID (Patient not taking: Reported on 12/28/2016) 100 mL 5  . metFORMIN (GLUCOPHAGE) 500 MG tablet TAKE 1 TABLET  TWICE DAILY WITH MEALS (Patient not taking: Reported on 12/28/2016) 180 tablet 0  . ondansetron (ZOFRAN ODT) 4 MG disintegrating tablet Take 1 tablet (4 mg total) by mouth every 8 (eight) hours as needed for nausea or vomiting. (Patient not taking: Reported on 12/28/2016) 20 tablet 0  . pravastatin (PRAVACHOL) 40 MG tablet TAKE 1 TABLET EVERY DAY (Patient not taking: Reported on 12/28/2016) 90 tablet 2  . warfarin (COUMADIN) 5 MG tablet Take as directed by anticoagulation clinic. (Patient not taking: Reported on 12/28/2016) 105 tablet 1   No current facility-administered medications for this visit.     PHYSICAL EXAMINATION: ECOG PERFORMANCE STATUS: 1 - Symptomatic but completely ambulatory  Vitals:   12/28/16 0908  BP: 106/70  Pulse: 63  Resp: 18  Temp: 97.8 F (36.6 C)   Filed Weights   12/28/16 0908  Weight: 249 lb 9.6 oz (113.2 kg)    GENERAL:alert, no distress and comfortable SKIN: skin color, texture, turgor are normal, no rashes or significant lesions EYES: normal, Conjunctiva are pink and non-injected, sclera clear OROPHARYNX:no exudate, no erythema and lips, buccal mucosa, and tongue normal  NECK: supple, thyroid normal size, non-tender, without nodularity LYMPH: Lymphadenopathy in his neck is regressed in size LUNGS: clear to auscultation and percussion with normal breathing effort HEART: regular rate &  rhythm and no murmurs with moderate bilateral lower extremity edema ABDOMEN:abdomen soft, non-tender and normal bowel sounds.  Feeding tube site looks okay Musculoskeletal:no cyanosis of digits and no clubbing  NEURO: alert & oriented x 3 with fluent speech, no focal motor/sensory deficits  LABORATORY DATA:  I have reviewed the data as listed    Component Value Date/Time   NA 137 12/27/2016 1137   NA 139 12/21/2016 1423   K 4.1 12/27/2016 1137   K 4.3 12/21/2016 1423   CL 99 (L) 12/27/2016 1137   CO2 28 12/27/2016 1137   CO2 25 12/21/2016 1423   GLUCOSE 129 (H)  12/27/2016 1137   GLUCOSE 106 12/21/2016 1423   BUN 15 12/27/2016 1137   BUN 17.0 12/21/2016 1423   CREATININE 0.71 12/27/2016 1137   CREATININE 0.7 12/21/2016 1423   CALCIUM 9.3 12/27/2016 1137   CALCIUM 9.9 12/21/2016 1423   PROT 7.9 12/21/2016 1423   ALBUMIN 3.4 (L) 12/21/2016 1423   AST 19 12/21/2016 1423   ALT 23 12/21/2016 1423   ALKPHOS 119 12/21/2016 1423   BILITOT 0.80 12/21/2016 1423   GFRNONAA >60 12/27/2016 1137   GFRAA >60 12/27/2016 1137    No results found for: SPEP, UPEP  Lab Results  Component Value Date   WBC 4.3 12/27/2016   NEUTROABS 3.6 12/27/2016   HGB 13.3 12/27/2016   HCT 39.8 12/27/2016   MCV 86.3 12/27/2016   PLT 227 12/27/2016      Chemistry      Component Value Date/Time   NA 137 12/27/2016 1137   NA 139 12/21/2016 1423   K 4.1 12/27/2016 1137   K 4.3 12/21/2016 1423   CL 99 (L) 12/27/2016 1137   CO2 28 12/27/2016 1137   CO2 25 12/21/2016 1423   BUN 15 12/27/2016 1137   BUN 17.0 12/21/2016 1423   CREATININE 0.71 12/27/2016 1137   CREATININE 0.7 12/21/2016 1423      Component Value Date/Time   CALCIUM 9.3 12/27/2016 1137   CALCIUM 9.9 12/21/2016 1423   ALKPHOS 119 12/21/2016 1423   AST 19 12/21/2016 1423   ALT 23 12/21/2016 1423   BILITOT 0.80 12/21/2016 1423       RADIOGRAPHIC STUDIES: I have personally reviewed the radiological images as listed and agreed with the findings in the report. Ir Gastrostomy Tube Mod Sed  Result Date: 12/27/2016 INDICATION: Tongue cancer EXAM: PERC PLACEMENT GASTROSTOMY MEDICATIONS: Ancef 2 g; Antibiotics were administered within 1 hour of the procedure. Glucagon 1 mg IV ANESTHESIA/SEDATION: Versed 2 mg IV; Fentanyl 100 mcg IV Moderate Sedation Time:  20 The patient was continuously monitored during the procedure by the interventional radiology nurse under my direct supervision. CONTRAST:  24mL ISOVUE-300 IOPAMIDOL (ISOVUE-300) INJECTION 61%, 13mL ISOVUE-300 IOPAMIDOL (ISOVUE-300) INJECTION 61% -  administered into the gastric lumen. FLUOROSCOPY TIME:  Fluoroscopy Time: 3 minutes 30 seconds (107 mGy). COMPLICATIONS: None immediate. PROCEDURE: The procedure, risks, benefits, and alternatives were explained to the patient. Questions regarding the procedure were encouraged and answered. The patient understands and consents to the procedure. The epigastrium was prepped with Betadine in a sterile fashion, and a sterile drape was applied covering the operative field. A sterile gown and sterile gloves were used for the procedure. A 5-French orogastric tube is placed under fluoroscopic guidance. Scout imaging of the abdomen confirms barium within the transverse colon. The stomach was distended with gas. Under fluoroscopic guidance, an 18 gauge needle was utilized to puncture the anterior wall of the body  of the stomach. An Amplatz wire was advanced through the needle passing a T fastener into the lumen of the stomach. The T fastener was secured for gastropexy. A 9-French sheath was inserted. A snare was advanced through the 9-French sheath. A Britta Mccreedy was advanced through the orogastric tube. It was snared then pulled out the oral cavity, pulling the snare, as well. The leading edge of the gastrostomy was attached to the snare. It was then pulled down the esophagus and out the percutaneous site. It was secured in place. Contrast was injected. The image demonstrates placement of a 20-French pull-through type gastrostomy tube into the body of the stomach. IMPRESSION: Successful 20 French pull-through gastrostomy. Electronically Signed   By: Marybelle Killings M.D.   On: 12/27/2016 15:21    ASSESSMENT & PLAN:  Malignant neoplasm of base of tongue ([facility]) The neck mass has reduced in size. Despite weight loss, overall, she tolerated treatment well Continue close surveillance for toxicity review  Chronic systolic CHF (congestive heart failure) ([facility]) Clinically, he has no signs or symptoms of congestive heart failure apart  from bilateral leg edema which is chronic.  He will continue maximum medication as directed by his cardiologist Lasix was recently discontinued due to risk of dehydration His blood pressure is borderline low but he is not symptomatic Continue close monitoring  Type 2 diabetes mellitus ([place]) The patient had recent weight loss and I warned him about risk of hypoglycemia His recent blood sugar monitoring is stable over 100  Weight loss, abnormal Since our last visit, he is doing better with oral intake His wife has written down food that he has eaten He is able to tolerate soft foods such as aches and Ensure He has close monitoring with dietitian.   No orders of the defined types were placed in this encounter.  All questions were answered. The patient knows to call the clinic with any problems, questions or concerns. No barriers to learning was detected. I spent 15 minutes counseling the patient face to face. The total time spent in the appointment was 20 minutes and more than 50% was on counseling and review of test results     Heath Lark, MD 12/28/2016 9:25 AM

## 2016-12-28 NOTE — Assessment & Plan Note (Addendum)
Clinically, he has no signs or symptoms of congestive heart failure apart from bilateral leg edema which is chronic.  He will continue maximum medication as directed by his cardiologist Lasix was recently discontinued due to risk of dehydration His blood pressure is borderline low but he is not symptomatic Continue close monitoring

## 2016-12-28 NOTE — Progress Notes (Signed)
A user error has taken place: encounter opened in error, closed for administrative reasons.

## 2016-12-28 NOTE — Assessment & Plan Note (Signed)
Since our last visit, he is doing better with oral intake His wife has written down food that he has eaten He is able to tolerate soft foods such as aches and Ensure He has close monitoring with dietitian.

## 2016-12-28 NOTE — Assessment & Plan Note (Signed)
The patient had recent weight loss and I warned him about risk of hypoglycemia His recent blood sugar monitoring is stable over 100

## 2016-12-28 NOTE — Progress Notes (Signed)
Nutrition Follow-up:  Patient with P16 tongue cancer and is a patient of Dr. Isidore Moos. Patient receiving radiation.   Patient received PEG tube yesterday in IR.    Wife joins him for clinic visit this am for tube feeding teaching.   Patient reports that he has no taste.  He reports that food taste like sawdust or silly putty.  He is eating blenderized foods mostly.  Reports no trouble swallowing water of liquids like ensure enlive.  Reports he drinks 2-3 per day (700 calories, 40 g of protein -1050 calories 60 g of protein). Also reports he drinks milkshakes, fried egg.    No other nutrition symptoms reported today.  Medications: reviewed and currently taking all medications via mouth  Labs: reviewed  Anthropometrics:   Height: 72 inches Weight: 256.2 lb on 4/17 UBW: 285 pounds BMI: 33   Estimated Energy Needs  Kcals: 2425-2600 calories/d Protein: 122-162 g/d (using IBW of 81 kg) Fluid: >2.4 L/d  NUTRITION DIAGNOSIS: Predicted suboptimal energy intake continues with placement of PEG tube    INTERVENTION:   Recommend goal tube feeding regimen of osmolite 1.5, 7 cans per day with promod 52ml BID for added protein.  Provides 2688 calories, 124 g of protein, 1762ml of water (with 49ml water flush before and after 4 times per day with feedings) Patient will need to drink additional 1 liter of water orally to provide 2746ml per day.   Patient able to repeat back to me tube feeding regimen.  Written instructions given to wife.  [facility] notified of order.  Spoke with Liliane Channel, RN navigator and patient and wife will pick up tube feeding supplies tomorrow at radiation appointment.   Patient to continue to eat orally as tolerated.    MONITORING, EVALUATION, GOAL: Patient will utilize PEG for calories and protein to maintain lean muscle mass   NEXT VISIT: April 27th with Jerrell Belfast B. Zenia Resides, [place], [place] Registered Dietitian [phone] (pager)

## 2016-12-28 NOTE — Assessment & Plan Note (Signed)
The neck mass has reduced in size. Despite weight loss, overall, she tolerated treatment well Continue close surveillance for toxicity review

## 2016-12-29 ENCOUNTER — Ambulatory Visit
Admission: RE | Admit: 2016-12-29 | Discharge: 2016-12-29 | Disposition: A | Discharge status: Home / Self Care | Payer: Medicare HMO | Source: Ambulatory Visit | Attending: Radiation Oncology | Admitting: Radiation Oncology

## 2016-12-29 ENCOUNTER — Encounter: Payer: Self-pay | Admitting: *Deleted

## 2016-12-29 DIAGNOSIS — Z7901 Long term (current) use of anticoagulants: Secondary | ICD-10-CM | POA: Diagnosis not present

## 2016-12-29 DIAGNOSIS — I428 Other cardiomyopathies: Secondary | ICD-10-CM | POA: Diagnosis not present

## 2016-12-29 DIAGNOSIS — C01 Malignant neoplasm of base of tongue: Secondary | ICD-10-CM | POA: Diagnosis not present

## 2016-12-29 DIAGNOSIS — R634 Abnormal weight loss: Secondary | ICD-10-CM | POA: Diagnosis not present

## 2016-12-29 DIAGNOSIS — Z510 Encounter for antineoplastic radiation therapy: Secondary | ICD-10-CM | POA: Diagnosis not present

## 2016-12-29 DIAGNOSIS — E119 Type 2 diabetes mellitus without complications: Secondary | ICD-10-CM | POA: Diagnosis not present

## 2016-12-29 DIAGNOSIS — I4891 Unspecified atrial fibrillation: Secondary | ICD-10-CM | POA: Diagnosis not present

## 2016-12-29 DIAGNOSIS — Z95810 Presence of automatic (implantable) cardiac defibrillator: Secondary | ICD-10-CM | POA: Diagnosis not present

## 2016-12-29 DIAGNOSIS — Z79899 Other long term (current) drug therapy: Secondary | ICD-10-CM | POA: Diagnosis not present

## 2016-12-30 ENCOUNTER — Ambulatory Visit
Admission: RE | Admit: 2016-12-30 | Discharge: 2016-12-30 | Disposition: A | Discharge status: Home / Self Care | Payer: Medicare HMO | Source: Ambulatory Visit | Attending: Radiation Oncology | Admitting: Radiation Oncology

## 2016-12-30 DIAGNOSIS — I4891 Unspecified atrial fibrillation: Secondary | ICD-10-CM | POA: Diagnosis not present

## 2016-12-30 DIAGNOSIS — C140 Malignant neoplasm of pharynx, unspecified: Secondary | ICD-10-CM | POA: Diagnosis not present

## 2016-12-30 DIAGNOSIS — Z79899 Other long term (current) drug therapy: Secondary | ICD-10-CM | POA: Diagnosis not present

## 2016-12-30 DIAGNOSIS — E119 Type 2 diabetes mellitus without complications: Secondary | ICD-10-CM | POA: Diagnosis not present

## 2016-12-30 DIAGNOSIS — Z510 Encounter for antineoplastic radiation therapy: Secondary | ICD-10-CM | POA: Diagnosis not present

## 2016-12-30 DIAGNOSIS — I428 Other cardiomyopathies: Secondary | ICD-10-CM | POA: Diagnosis not present

## 2016-12-30 DIAGNOSIS — R634 Abnormal weight loss: Secondary | ICD-10-CM | POA: Diagnosis not present

## 2016-12-30 DIAGNOSIS — C01 Malignant neoplasm of base of tongue: Secondary | ICD-10-CM | POA: Diagnosis not present

## 2016-12-30 DIAGNOSIS — Z95810 Presence of automatic (implantable) cardiac defibrillator: Secondary | ICD-10-CM | POA: Diagnosis not present

## 2016-12-30 DIAGNOSIS — Z7901 Long term (current) use of anticoagulants: Secondary | ICD-10-CM | POA: Diagnosis not present

## 2016-12-30 NOTE — Progress Notes (Signed)
Oncology Nurse Navigator Documentation  Met with Mr. Levingston and his wife following RT. He denied issues/concerns with PEG, using without difficulty. They understand I can be contacted.  Gayleen Orem, RN, BSN, [place]ck Oncology Nurse [place] [facility] [phone]

## 2016-12-31 ENCOUNTER — Ambulatory Visit: Payer: Medicare HMO | Admitting: Nutrition

## 2016-12-31 ENCOUNTER — Ambulatory Visit
Admission: RE | Admit: 2016-12-31 | Discharge: 2016-12-31 | Disposition: A | Discharge status: Home / Self Care | Payer: Medicare HMO | Source: Ambulatory Visit | Attending: Radiation Oncology | Admitting: Radiation Oncology

## 2016-12-31 DIAGNOSIS — Z95810 Presence of automatic (implantable) cardiac defibrillator: Secondary | ICD-10-CM | POA: Diagnosis not present

## 2016-12-31 DIAGNOSIS — Z79899 Other long term (current) drug therapy: Secondary | ICD-10-CM | POA: Diagnosis not present

## 2016-12-31 DIAGNOSIS — Z510 Encounter for antineoplastic radiation therapy: Secondary | ICD-10-CM | POA: Diagnosis not present

## 2016-12-31 DIAGNOSIS — I4891 Unspecified atrial fibrillation: Secondary | ICD-10-CM | POA: Diagnosis not present

## 2016-12-31 DIAGNOSIS — C01 Malignant neoplasm of base of tongue: Secondary | ICD-10-CM | POA: Diagnosis not present

## 2016-12-31 DIAGNOSIS — Z7901 Long term (current) use of anticoagulants: Secondary | ICD-10-CM | POA: Diagnosis not present

## 2016-12-31 DIAGNOSIS — I428 Other cardiomyopathies: Secondary | ICD-10-CM | POA: Diagnosis not present

## 2016-12-31 DIAGNOSIS — R634 Abnormal weight loss: Secondary | ICD-10-CM | POA: Diagnosis not present

## 2016-12-31 DIAGNOSIS — E119 Type 2 diabetes mellitus without complications: Secondary | ICD-10-CM | POA: Diagnosis not present

## 2016-12-31 NOTE — Progress Notes (Signed)
Nutrition follow-up completed with patient status post radiation therapy for tongue cancer. Patient's weight has improved and was documented as 252.4 pounds more on April 27 increased from 249.6 pounds April 24. Patient reports he is consuming 2-3 bottles Ensure Plus daily. Patient's oral intake has improved slightly. Reports last bowel movement was sometime last week  Estimated nutrition needs:  2425-2600 calories, 122-140 grams, 2.4 L fluid daily.  Nutrition diagnosis:  Predicted suboptimal energy intake has continued.  Intervention: Patient was educated to continue consuming 3 bottles Ensure Plus daily with increased oral intake as tolerated to maintain weight. Enforced importance of adding tube feeding.  If patient is unable to swallow Ensure Plus. Provided one complementary case of Ensure Plus. Recommended patient increase fluid intake. Recommended patient improve constipation by incorporating a regular bowel regimen. Questions were answered.  Teach back method used.  Contact information has been provided.  Monitoring, evaluation, goals:  Patient will increase oral intake to minimize weight loss throughout treatment.  Next visit:  Tuesday, May 1.  **Disclaimer: This note was dictated with voice recognition software. Similar sounding words can inadvertently be transcribed and this note may contain transcription errors which may not have been corrected upon publication of note.**

## 2017-01-01 ENCOUNTER — Ambulatory Visit: Payer: Medicare HMO

## 2017-01-03 ENCOUNTER — Other Ambulatory Visit: Payer: Self-pay | Admitting: Radiation Oncology

## 2017-01-03 ENCOUNTER — Ambulatory Visit
Admission: RE | Admit: 2017-01-03 | Discharge: 2017-01-03 | Disposition: A | Discharge status: Home / Self Care | Payer: Medicare HMO | Source: Ambulatory Visit | Attending: Radiation Oncology | Admitting: Radiation Oncology

## 2017-01-03 DIAGNOSIS — Z95810 Presence of automatic (implantable) cardiac defibrillator: Secondary | ICD-10-CM | POA: Diagnosis not present

## 2017-01-03 DIAGNOSIS — Z510 Encounter for antineoplastic radiation therapy: Secondary | ICD-10-CM | POA: Diagnosis not present

## 2017-01-03 DIAGNOSIS — C01 Malignant neoplasm of base of tongue: Secondary | ICD-10-CM | POA: Diagnosis not present

## 2017-01-03 DIAGNOSIS — Z79899 Other long term (current) drug therapy: Secondary | ICD-10-CM | POA: Diagnosis not present

## 2017-01-03 DIAGNOSIS — E119 Type 2 diabetes mellitus without complications: Secondary | ICD-10-CM | POA: Diagnosis not present

## 2017-01-03 DIAGNOSIS — I4891 Unspecified atrial fibrillation: Secondary | ICD-10-CM | POA: Diagnosis not present

## 2017-01-03 DIAGNOSIS — Z7901 Long term (current) use of anticoagulants: Secondary | ICD-10-CM | POA: Diagnosis not present

## 2017-01-03 DIAGNOSIS — R634 Abnormal weight loss: Secondary | ICD-10-CM | POA: Diagnosis not present

## 2017-01-03 DIAGNOSIS — I428 Other cardiomyopathies: Secondary | ICD-10-CM | POA: Diagnosis not present

## 2017-01-03 MED ORDER — HYDROCODONE-ACETAMINOPHEN 7.5-325 MG/15ML PO SOLN: ORAL | 0 refills | Status: DC

## 2017-01-04 ENCOUNTER — Ambulatory Visit: Payer: Medicare HMO | Admitting: Nutrition

## 2017-01-04 ENCOUNTER — Encounter: Payer: Self-pay | Admitting: Hematology and Oncology

## 2017-01-04 ENCOUNTER — Ambulatory Visit
Admission: RE | Admit: 2017-01-04 | Discharge: 2017-01-04 | Disposition: A | Discharge status: Home / Self Care | Payer: Medicare HMO | Source: Ambulatory Visit | Attending: Radiation Oncology | Admitting: Radiation Oncology

## 2017-01-04 ENCOUNTER — Ambulatory Visit (HOSPITAL_BASED_OUTPATIENT_CLINIC_OR_DEPARTMENT_OTHER): Payer: Medicare HMO | Admitting: Hematology and Oncology

## 2017-01-04 VITALS — BP 136/85 | HR 84 | Temp 98.2°F | Resp 17 | Ht 72.0 in | Wt 254.2 lb

## 2017-01-04 DIAGNOSIS — I428 Other cardiomyopathies: Secondary | ICD-10-CM

## 2017-01-04 DIAGNOSIS — R634 Abnormal weight loss: Secondary | ICD-10-CM | POA: Diagnosis not present

## 2017-01-04 DIAGNOSIS — C01 Malignant neoplasm of base of tongue: Secondary | ICD-10-CM

## 2017-01-04 DIAGNOSIS — Z79899 Other long term (current) drug therapy: Secondary | ICD-10-CM | POA: Diagnosis not present

## 2017-01-04 DIAGNOSIS — E119 Type 2 diabetes mellitus without complications: Secondary | ICD-10-CM | POA: Diagnosis not present

## 2017-01-04 DIAGNOSIS — B370 Candidal stomatitis: Secondary | ICD-10-CM

## 2017-01-04 DIAGNOSIS — Z510 Encounter for antineoplastic radiation therapy: Secondary | ICD-10-CM | POA: Diagnosis not present

## 2017-01-04 DIAGNOSIS — I4891 Unspecified atrial fibrillation: Secondary | ICD-10-CM | POA: Diagnosis not present

## 2017-01-04 DIAGNOSIS — E118 Type 2 diabetes mellitus with unspecified complications: Secondary | ICD-10-CM

## 2017-01-04 DIAGNOSIS — Z95810 Presence of automatic (implantable) cardiac defibrillator: Secondary | ICD-10-CM | POA: Diagnosis not present

## 2017-01-04 DIAGNOSIS — Z7901 Long term (current) use of anticoagulants: Secondary | ICD-10-CM | POA: Diagnosis not present

## 2017-01-04 MED ORDER — FLUCONAZOLE 100 MG PO TABS: 100.0000 mg | ORAL_TABLET | Freq: Every day | ORAL | 0 refills | Status: DC

## 2017-01-04 NOTE — Assessment & Plan Note (Signed)
The patient had recent weight loss and but since then his weight has improved Recommend close blood sugar monitoring

## 2017-01-04 NOTE — Progress Notes (Signed)
[facility] OFFICE PROGRESS NOTE  Patient Care Team: Eulas Post, MD as PCP - General (Family Medicine) Eppie Gibson, MD as Attending Physician (Radiation Oncology) Heath Lark, MD as Consulting Physician (Hematology and Oncology) Leota Sauers, RN as Oncology Nurse [place], [place] as Dietitian (Nutrition) Jomarie Longs, PT as Physical Therapist (Physical Therapy) Sharen Counter, CCC-SLP as Speech Language Pathologist (Speech Pathology) Kennith Center, LCSW as Social Worker Rozetta Nunnery, MD as Consulting Physician (Otolaryngology)  SUMMARY OF ONCOLOGIC HISTORY:   Malignant neoplasm of base of tongue ([place])   11/08/2016 Pathology Results    INSUFFICIENT FOR DIAGNOSIS. The Specimen Is Processed And Examined Microscopically, But Found To Be UNSATISFACTORY For Evaluation. Diagnosis NONDIAGNOSTIC MATERIAL      11/09/2016 Imaging    CT abdomen: Umbilical hernia containing only peritoneal fat. No bowel obstruction. Chronic cholelithiasis. A gallstone is present on the prior study of 06/10/2014. 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      11/10/2016 Imaging    CT neck: Right base of tongue mass likely representing carcinoma. The mass measures up to 37 mm. Visible mass invades base of tongue 27 mm and extends 9 mm to contralateral base of tongue. 2. Right level 2, 3, 4, 5a/b necrotic lymphadenopathy with findings of extra nodal extension. Left level 2 lymphadenopathy      11/10/2016 - 11/17/2016 Hospital Admission    He was admitted for management of bowel obstruction, managed conservatively      11/16/2016 Pathology Results    1. Tongue, biopsy, Base - SQUAMOUS CELL CARCINOMA. - SEE MICROSCOPIC DESCRIPTION 2. Soft tissue, lipoma, Right Neck - BENIGN ADIPOSE TISSUE CONSISTENT WITH LIPOMA. - NO EVIDENCE OF MALIGNANCY. Microscopic Comment 1. Immunohistochemistry for p16 will be performed and reported as an  addendum. Addendum: Immunohistochemistry for p16 (HPV) shows diffuse strong positivity in the tumor.       11/16/2016 Surgery    OPERATION PERFORMED:  Direct laryngoscopy and biopsy of base of tongue mass (frozen section positive for squamous cell carcinoma).  Excision of large right neck lipoma, 10 x 12 cm in size.  SURGEON:  Leonides Sake. Lucia Gaskins, MD.  DESCRIPTION OF PROCEDURE:  Oral cavity was clear.  Base of tongue had a partially exophytic mass extending down to the vallecula.  The epiglottis was uninvolved.  On direct laryngoscopy of the laryngeal surface, epiglottis was clear.  AE folds were clear.  Vocal cords were clear.  Piriform sinuses were clear. Next, biopsies were obtained from the base of tongue and sent for frozen section.  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        11/22/2016 PET scan    Hypermetabolic base of tongue mass compatible with known neoplasm. 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 No evidence for distant metastatic disease. Aortic atherosclerosis and coronary artery calcification. Gallstones.      12/07/2016 -  Radiation Therapy    The patient has received radiation treatment       12/27/2016 Procedure    Successful 20 French pull-through gastrostomy       INTERVAL HISTORY: Please see below for problem oriented charting. He returns with his wife today He is doing well He is using some nutritional supplement through the feeding tube as well as attempting to eat soft diet by mouth He has not lost any weight since I saw him Oral thrush/sore throat is improved He denies recent chest pain or shortness  of breath The patient denies any recent signs or symptoms of bleeding such as spontaneous epistaxis, hematuria or hematochezia.   REVIEW OF SYSTEMS:   Constitutional: Denies fevers, chills or abnormal weight loss Eyes: Denies blurriness of vision Respiratory: Denies cough, dyspnea  or wheezes Cardiovascular: Denies palpitation, chest discomfort or lower extremity swelling Gastrointestinal:  Denies nausea, heartburn or change in bowel habits Skin: Denies abnormal skin rashes Lymphatics: Denies new lymphadenopathy or easy bruising Neurological:Denies numbness, tingling or new weaknesses Behavioral/Psych: Mood is stable, no new changes  All other systems were reviewed with the patient and are negative.  I have reviewed the past medical history, past surgical history, social history and family history with the patient and they are unchanged from previous note.  ALLERGIES:  is allergic to niaspan [niacin er].  MEDICATIONS:  Current Outpatient Prescriptions  Medication Sig Dispense Refill  . acetaminophen (TYLENOL) 500 MG tablet Take 1,000 mg by mouth every 6 (six) hours as needed for moderate pain.    . carvedilol (COREG) 25 MG tablet TAKE 1 TABLET TWICE DAILY WITH MEALS 180 tablet 1  . digoxin (LANOXIN) 0.125 MG tablet Take 1 tablet (0.125 mg total) by mouth daily. 90 tablet 3  . diltiazem (CARDIZEM) 90 MG tablet Take 1 tablet (90 mg total) by mouth daily. 30 tablet 3  . Nutritional Supplements (FEEDING SUPPLEMENT, OSMOLITE 1.5 CAL,) LIQD Give 2 cans of formula at 8am and 12 noon. Give additional 1 1/2 cans at 4pm and 8pm feeding.   Flush with 35ml of water before and after feeding 4 times per day 1659 mL 0  . Nutritional Supplements (PROMOD) LIQD Give 77ml (1 oz) 2 times per day via tube. Flush with 63ml of water before and after given. 946 mL   . nystatin (MYCOSTATIN) 100000 UNIT/ML suspension Take 5 mLs (500,000 Units total) by mouth 4 (four) times daily. Swish in mouth for a minute, then swallow. Do this for a total of 3 weeks. 420 mL 0  . polyethylene glycol (MIRALAX) packet Take 17 g by mouth daily as needed. Available OTC 30 each 0  . sodium fluoride (PREVIDENT 5000 PLUS) 1.1 % CREA dental cream Apply gel to tooth brush. Brush teeth for 2 minutes. Spit out excess-DO  NOT swallow. Repeat nightly. 1 Tube prn  . warfarin (COUMADIN) 5 MG tablet Take as directed by anticoagulation clinic. 105 tablet 1  . fluconazole (DIFLUCAN) 100 MG tablet Take 1 tablet (100 mg total) by mouth daily. 7 tablet 0  . furosemide (LASIX) 20 MG tablet Take 1 tablet (20 mg total) by mouth daily. (Patient not taking: Reported on 12/28/2016) 30 tablet 3  . HYDROcodone-acetaminophen (HYCET) 7.5-325 mg/15 ml solution Take 10-77mL with food Q 4hrs prn pain. (Patient not taking: Reported on 01/04/2017) 500 mL 0  . lidocaine (XYLOCAINE) 2 % solution Patient: Mix 1part 2% viscous lidocaine, 1part H20. Swish and swallow 53mL of this mixture, 13min before meals and at bedtime, up to QID (Patient not taking: Reported on 12/28/2016) 100 mL 5  . metFORMIN (GLUCOPHAGE) 500 MG tablet TAKE 1 TABLET TWICE DAILY WITH MEALS (Patient not taking: Reported on 12/28/2016) 180 tablet 0  . ondansetron (ZOFRAN ODT) 4 MG disintegrating tablet Take 1 tablet (4 mg total) by mouth every 8 (eight) hours as needed for nausea or vomiting. (Patient not taking: Reported on 12/28/2016) 20 tablet 0  . pravastatin (PRAVACHOL) 40 MG tablet TAKE 1 TABLET EVERY DAY (Patient not taking: Reported on 12/28/2016) 90 tablet 2   No  current facility-administered medications for this visit.     PHYSICAL EXAMINATION: ECOG PERFORMANCE STATUS: 1 - Symptomatic but completely ambulatory  Vitals:   01/04/17 0842  BP: 136/85  Pulse: 84  Resp: 17  Temp: 98.2 F (36.8 C)   Filed Weights   01/04/17 0842  Weight: 254 lb 3.2 oz (115.3 kg)    GENERAL:alert, no distress and comfortable SKIN: skin color, texture, turgor are normal, no rashes or significant lesions EYES: normal, Conjunctiva are pink and non-injected, sclera clear OROPHARYNX: Thrush is resolving NECK: supple, thyroid normal size, non-tender, without nodularity LYMPH: He has large palpable neck mass, reduce in size compared to previous exam  LUNGS: clear to auscultation and  percussion with normal breathing effort HEART: regular rate & rhythm and no murmurs with chronic lower extremity edema ABDOMEN:abdomen soft, non-tender and normal bowel sounds Musculoskeletal:no cyanosis of digits and no clubbing  NEURO: alert & oriented x 3 with fluent speech, no focal motor/sensory deficits  LABORATORY DATA:  I have reviewed the data as listed    Component Value Date/Time   NA 137 12/27/2016 1137   NA 139 12/21/2016 1423   K 4.1 12/27/2016 1137   K 4.3 12/21/2016 1423   CL 99 (L) 12/27/2016 1137   CO2 28 12/27/2016 1137   CO2 25 12/21/2016 1423   GLUCOSE 129 (H) 12/27/2016 1137   GLUCOSE 106 12/21/2016 1423   BUN 15 12/27/2016 1137   BUN 17.0 12/21/2016 1423   CREATININE 0.71 12/27/2016 1137   CREATININE 0.7 12/21/2016 1423   CALCIUM 9.3 12/27/2016 1137   CALCIUM 9.9 12/21/2016 1423   PROT 7.9 12/21/2016 1423   ALBUMIN 3.4 (L) 12/21/2016 1423   AST 19 12/21/2016 1423   ALT 23 12/21/2016 1423   ALKPHOS 119 12/21/2016 1423   BILITOT 0.80 12/21/2016 1423   GFRNONAA >60 12/27/2016 1137   GFRAA >60 12/27/2016 1137    No results found for: SPEP, UPEP  Lab Results  Component Value Date   WBC 4.3 12/27/2016   NEUTROABS 3.6 12/27/2016   HGB 13.3 12/27/2016   HCT 39.8 12/27/2016   MCV 86.3 12/27/2016   PLT 227 12/27/2016      Chemistry      Component Value Date/Time   NA 137 12/27/2016 1137   NA 139 12/21/2016 1423   K 4.1 12/27/2016 1137   K 4.3 12/21/2016 1423   CL 99 (L) 12/27/2016 1137   CO2 28 12/27/2016 1137   CO2 25 12/21/2016 1423   BUN 15 12/27/2016 1137   BUN 17.0 12/21/2016 1423   CREATININE 0.71 12/27/2016 1137   CREATININE 0.7 12/21/2016 1423      Component Value Date/Time   CALCIUM 9.3 12/27/2016 1137   CALCIUM 9.9 12/21/2016 1423   ALKPHOS 119 12/21/2016 1423   AST 19 12/21/2016 1423   ALT 23 12/21/2016 1423   BILITOT 0.80 12/21/2016 1423       RADIOGRAPHIC STUDIES: I have personally reviewed the radiological images as  listed and agreed with the findings in the report. Ir Gastrostomy Tube Mod Sed  Result Date: 12/27/2016 INDICATION: Tongue cancer EXAM: PERC PLACEMENT GASTROSTOMY MEDICATIONS: Ancef 2 g; Antibiotics were administered within 1 hour of the procedure. Glucagon 1 mg IV ANESTHESIA/SEDATION: Versed 2 mg IV; Fentanyl 100 mcg IV Moderate Sedation Time:  20 The patient was continuously monitored during the procedure by the interventional radiology nurse under my direct supervision. CONTRAST:  14mL ISOVUE-300 IOPAMIDOL (ISOVUE-300) INJECTION 61%, 68mL ISOVUE-300 IOPAMIDOL (ISOVUE-300) INJECTION 61% -  administered into the gastric lumen. FLUOROSCOPY TIME:  Fluoroscopy Time: 3 minutes 30 seconds (107 mGy). COMPLICATIONS: None immediate. PROCEDURE: The procedure, risks, benefits, and alternatives were explained to the patient. Questions regarding the procedure were encouraged and answered. The patient understands and consents to the procedure. The epigastrium was prepped with Betadine in a sterile fashion, and a sterile drape was applied covering the operative field. A sterile gown and sterile gloves were used for the procedure. A 5-French orogastric tube is placed under fluoroscopic guidance. Scout imaging of the abdomen confirms barium within the transverse colon. The stomach was distended with gas. Under fluoroscopic guidance, an 18 gauge needle was utilized to puncture the anterior wall of the body of the stomach. An Amplatz wire was advanced through the needle passing a T fastener into the lumen of the stomach. The T fastener was secured for gastropexy. A 9-French sheath was inserted. A snare was advanced through the 9-French sheath. A Britta Mccreedy was advanced through the orogastric tube. It was snared then pulled out the oral cavity, pulling the snare, as well. The leading edge of the gastrostomy was attached to the snare. It was then pulled down the esophagus and out the percutaneous site. It was secured in place. Contrast  was injected. The image demonstrates placement of a 20-French pull-through type gastrostomy tube into the body of the stomach. IMPRESSION: Successful 20 French pull-through gastrostomy. Electronically Signed   By: Marybelle Killings M.D.   On: 12/27/2016 15:21    ASSESSMENT & PLAN:  Malignant neoplasm of base of tongue ([facility]) The neck mass has reduced in size. He is doing well  Continue close surveillance for toxicity review  Nonischemic cardiomyopathy ([place]) Clinically, he has no signs or symptoms of congestive heart failure apart from bilateral leg edema which is chronic.  He will continue maximum medication as directed by his cardiologist  Thrush, oral Recently, I prescribed nystatin swish and swallow The thrush overall is improving but not completely resolved Recommend 1 week course of fluconazole I recommend his wife to get an appointment for him to get his INR level checked soon just in case of interaction with warfarin  Type 2 diabetes mellitus ([place]) The patient had recent weight loss and but since then his weight has improved Recommend close blood sugar monitoring   No orders of the defined types were placed in this encounter.  All questions were answered. The patient knows to call the clinic with any problems, questions or concerns. No barriers to learning was detected. I spent 15 minutes counseling the patient face to face. The total time spent in the appointment was 20 minutes and more than 50% was on counseling and review of test results     Heath Lark, MD 01/04/2017 9:36 AM

## 2017-01-04 NOTE — Assessment & Plan Note (Signed)
Clinically, he has no signs or symptoms of congestive heart failure apart from bilateral leg edema which is chronic.  He will continue maximum medication as directed by his cardiologist

## 2017-01-04 NOTE — Assessment & Plan Note (Signed)
The neck mass has reduced in size. He is doing well  Continue close surveillance for toxicity review

## 2017-01-04 NOTE — Assessment & Plan Note (Signed)
Recently, I prescribed nystatin swish and swallow The thrush overall is improving but not completely resolved Recommend 1 week course of fluconazole I recommend his wife to get an appointment for him to get his INR level checked soon just in case of interaction with warfarin

## 2017-01-04 NOTE — Progress Notes (Signed)
Nutrition follow-up completed with patient status post radiation therapy for tongue cancer. Patient's weight has improved and was documented as 254.2 pounds on May 1 increased from 252.4 pounds April 27. Patient is consuming approximately 4 Ensure Plus daily.  In addition, he is using his feeding tube and has been instilling 3 bottles of Osmolite 1.5 via PEG. He continues to try some blenderized foods. He is flushing his feeding tube before and after bolus feedings with 60 mL of water. Patient is consuming approximately 48 ounces of water by mouth. Patient reports constipation has resolved.  Nutrition diagnosis:  Predicted suboptimal energy intake has improved.  Intervention: Educated patient to continue strategies for increased oral intake. Encouraged to continue to consume Ensure Plus by mouth and utilize feeding tube if unable to obtain adequate nutrition by mouth. Encouraged patient to continue MiraLAX to avoid constipation. Teach back method used.  Monitoring, evaluation, goals: Patient will tolerate adequate calories and protein to minimize weight loss.  Next visit: Wednesday May 9 after radiation therapy.  **Disclaimer: This note was dictated with voice recognition software. Similar sounding words can inadvertently be transcribed and this note may contain transcription errors which may not have been corrected upon publication of note.**

## 2017-01-05 ENCOUNTER — Ambulatory Visit
Admission: RE | Admit: 2017-01-05 | Discharge: 2017-01-05 | Disposition: A | Discharge status: Home / Self Care | Payer: Medicare HMO | Source: Ambulatory Visit | Attending: Radiation Oncology | Admitting: Radiation Oncology

## 2017-01-05 ENCOUNTER — Ambulatory Visit: Payer: Medicare HMO

## 2017-01-05 ENCOUNTER — Ambulatory Visit (INDEPENDENT_AMBULATORY_CARE_PROVIDER_SITE_OTHER): Payer: Medicare HMO | Admitting: Ophthalmology

## 2017-01-05 DIAGNOSIS — C01 Malignant neoplasm of base of tongue: Secondary | ICD-10-CM | POA: Diagnosis not present

## 2017-01-05 DIAGNOSIS — I428 Other cardiomyopathies: Secondary | ICD-10-CM | POA: Diagnosis not present

## 2017-01-05 DIAGNOSIS — H43813 Vitreous degeneration, bilateral: Secondary | ICD-10-CM

## 2017-01-05 DIAGNOSIS — I10 Essential (primary) hypertension: Secondary | ICD-10-CM

## 2017-01-05 DIAGNOSIS — E113213 Type 2 diabetes mellitus with mild nonproliferative diabetic retinopathy with macular edema, bilateral: Secondary | ICD-10-CM

## 2017-01-05 DIAGNOSIS — H35033 Hypertensive retinopathy, bilateral: Secondary | ICD-10-CM | POA: Diagnosis not present

## 2017-01-05 DIAGNOSIS — Z510 Encounter for antineoplastic radiation therapy: Secondary | ICD-10-CM | POA: Diagnosis not present

## 2017-01-05 DIAGNOSIS — E119 Type 2 diabetes mellitus without complications: Secondary | ICD-10-CM | POA: Diagnosis not present

## 2017-01-05 DIAGNOSIS — Z95810 Presence of automatic (implantable) cardiac defibrillator: Secondary | ICD-10-CM | POA: Diagnosis not present

## 2017-01-05 DIAGNOSIS — H35373 Puckering of macula, bilateral: Secondary | ICD-10-CM

## 2017-01-05 DIAGNOSIS — E11319 Type 2 diabetes mellitus with unspecified diabetic retinopathy without macular edema: Secondary | ICD-10-CM | POA: Diagnosis not present

## 2017-01-05 DIAGNOSIS — Z7901 Long term (current) use of anticoagulants: Secondary | ICD-10-CM | POA: Diagnosis not present

## 2017-01-05 DIAGNOSIS — I4891 Unspecified atrial fibrillation: Secondary | ICD-10-CM | POA: Diagnosis not present

## 2017-01-05 DIAGNOSIS — R634 Abnormal weight loss: Secondary | ICD-10-CM | POA: Diagnosis not present

## 2017-01-05 DIAGNOSIS — Z79899 Other long term (current) drug therapy: Secondary | ICD-10-CM | POA: Diagnosis not present

## 2017-01-06 ENCOUNTER — Ambulatory Visit
Admission: RE | Admit: 2017-01-06 | Discharge: 2017-01-06 | Disposition: A | Discharge status: Home / Self Care | Payer: Medicare HMO | Source: Ambulatory Visit | Attending: Radiation Oncology | Admitting: Radiation Oncology

## 2017-01-06 DIAGNOSIS — Z7901 Long term (current) use of anticoagulants: Secondary | ICD-10-CM | POA: Diagnosis not present

## 2017-01-06 DIAGNOSIS — C01 Malignant neoplasm of base of tongue: Secondary | ICD-10-CM | POA: Diagnosis not present

## 2017-01-06 DIAGNOSIS — E119 Type 2 diabetes mellitus without complications: Secondary | ICD-10-CM | POA: Diagnosis not present

## 2017-01-06 DIAGNOSIS — I4891 Unspecified atrial fibrillation: Secondary | ICD-10-CM | POA: Diagnosis not present

## 2017-01-06 DIAGNOSIS — R634 Abnormal weight loss: Secondary | ICD-10-CM | POA: Diagnosis not present

## 2017-01-06 DIAGNOSIS — Z79899 Other long term (current) drug therapy: Secondary | ICD-10-CM | POA: Diagnosis not present

## 2017-01-06 DIAGNOSIS — I428 Other cardiomyopathies: Secondary | ICD-10-CM | POA: Diagnosis not present

## 2017-01-06 DIAGNOSIS — Z95810 Presence of automatic (implantable) cardiac defibrillator: Secondary | ICD-10-CM | POA: Diagnosis not present

## 2017-01-06 DIAGNOSIS — Z510 Encounter for antineoplastic radiation therapy: Secondary | ICD-10-CM | POA: Diagnosis not present

## 2017-01-07 ENCOUNTER — Ambulatory Visit
Admission: RE | Admit: 2017-01-07 | Discharge: 2017-01-07 | Disposition: A | Discharge status: Home / Self Care | Payer: Medicare HMO | Source: Ambulatory Visit | Attending: Radiation Oncology | Admitting: Radiation Oncology

## 2017-01-07 ENCOUNTER — Encounter: Payer: Self-pay | Admitting: *Deleted

## 2017-01-07 DIAGNOSIS — Z79899 Other long term (current) drug therapy: Secondary | ICD-10-CM | POA: Diagnosis not present

## 2017-01-07 DIAGNOSIS — Z95810 Presence of automatic (implantable) cardiac defibrillator: Secondary | ICD-10-CM | POA: Diagnosis not present

## 2017-01-07 DIAGNOSIS — I4891 Unspecified atrial fibrillation: Secondary | ICD-10-CM | POA: Diagnosis not present

## 2017-01-07 DIAGNOSIS — Z7901 Long term (current) use of anticoagulants: Secondary | ICD-10-CM | POA: Diagnosis not present

## 2017-01-07 DIAGNOSIS — Z510 Encounter for antineoplastic radiation therapy: Secondary | ICD-10-CM | POA: Diagnosis not present

## 2017-01-07 DIAGNOSIS — I428 Other cardiomyopathies: Secondary | ICD-10-CM | POA: Diagnosis not present

## 2017-01-07 DIAGNOSIS — E119 Type 2 diabetes mellitus without complications: Secondary | ICD-10-CM | POA: Diagnosis not present

## 2017-01-07 DIAGNOSIS — R634 Abnormal weight loss: Secondary | ICD-10-CM | POA: Diagnosis not present

## 2017-01-07 DIAGNOSIS — C01 Malignant neoplasm of base of tongue: Secondary | ICD-10-CM | POA: Diagnosis not present

## 2017-01-07 NOTE — Progress Notes (Signed)
Oncology Nurse Navigator Documentation  To provide support, encouragement and care continuity, met with Collin [name] and his wife following his RT. He reported "all is well", denied issues with PEG. I provided pair of mesh briefs for securing PEG tube as alternative to tape. I provided Epic calendar for remaining appts. They understand to contact me with needs/concerns.  Gayleen Orem, RN, BSN, [place] [facility] Oncology Nurse [facility] [phone]

## 2017-01-10 ENCOUNTER — Ambulatory Visit
Admission: RE | Admit: 2017-01-10 | Discharge: 2017-01-10 | Disposition: A | Discharge status: Home / Self Care | Payer: Medicare HMO | Source: Ambulatory Visit | Attending: Radiation Oncology | Admitting: Radiation Oncology

## 2017-01-10 ENCOUNTER — Ambulatory Visit (INDEPENDENT_AMBULATORY_CARE_PROVIDER_SITE_OTHER): Payer: Medicare HMO | Admitting: General Practice

## 2017-01-10 ENCOUNTER — Other Ambulatory Visit: Payer: Self-pay | Admitting: Radiation Oncology

## 2017-01-10 ENCOUNTER — Ambulatory Visit: Payer: Medicare HMO | Attending: Radiation Oncology

## 2017-01-10 DIAGNOSIS — I5022 Chronic systolic (congestive) heart failure: Secondary | ICD-10-CM

## 2017-01-10 DIAGNOSIS — C01 Malignant neoplasm of base of tongue: Secondary | ICD-10-CM | POA: Diagnosis not present

## 2017-01-10 DIAGNOSIS — I428 Other cardiomyopathies: Secondary | ICD-10-CM | POA: Diagnosis not present

## 2017-01-10 DIAGNOSIS — Z7901 Long term (current) use of anticoagulants: Secondary | ICD-10-CM | POA: Diagnosis not present

## 2017-01-10 DIAGNOSIS — E119 Type 2 diabetes mellitus without complications: Secondary | ICD-10-CM | POA: Diagnosis not present

## 2017-01-10 DIAGNOSIS — R634 Abnormal weight loss: Secondary | ICD-10-CM | POA: Diagnosis not present

## 2017-01-10 DIAGNOSIS — I4891 Unspecified atrial fibrillation: Secondary | ICD-10-CM

## 2017-01-10 DIAGNOSIS — Z79899 Other long term (current) drug therapy: Secondary | ICD-10-CM | POA: Diagnosis not present

## 2017-01-10 DIAGNOSIS — Z510 Encounter for antineoplastic radiation therapy: Secondary | ICD-10-CM | POA: Diagnosis not present

## 2017-01-10 DIAGNOSIS — Z5181 Encounter for therapeutic drug level monitoring: Secondary | ICD-10-CM

## 2017-01-10 DIAGNOSIS — R1310 Dysphagia, unspecified: Secondary | ICD-10-CM | POA: Diagnosis not present

## 2017-01-10 DIAGNOSIS — Z95810 Presence of automatic (implantable) cardiac defibrillator: Secondary | ICD-10-CM | POA: Diagnosis not present

## 2017-01-10 LAB — BASIC METABOLIC PANEL
Anion Gap: 10 mEq/L (ref 3–11)
BUN: 15 mg/dL (ref 7.0–26.0)
CO2: 28 mEq/L (ref 22–29)
Calcium: 9.7 mg/dL (ref 8.4–10.4)
Chloride: 102 mEq/L (ref 98–109)
Creatinine: 0.8 mg/dL (ref 0.7–1.3)
EGFR: 89 mL/min/{1.73_m2} — ABNORMAL LOW (ref >90)
Glucose: 108 mg/dl (ref 70–140)
Potassium: 4.5 mEq/L (ref 3.5–5.1)
Sodium: 140 mEq/L (ref 136–145)

## 2017-01-10 LAB — PROTIME-INR
INR: 3.4 (ref 2.00–3.50)
Protime: 40.8 Seconds — ABNORMAL HIGH (ref 10.6–13.4)

## 2017-01-10 MED ORDER — BIAFINE EX EMUL
CUTANEOUS | Status: DC | PRN
  Administered 2017-01-10: 09:00:00 via TOPICAL

## 2017-01-10 MED ORDER — EMOLLIENT BASE EX CREA: TOPICAL_CREAM | CUTANEOUS | 0 refills | Status: DC | PRN

## 2017-01-10 NOTE — Patient Instructions (Signed)
Pre visit review using our clinic review tool, if applicable. No additional management support is needed unless otherwise documented below in the visit note. 

## 2017-01-10 NOTE — Therapy (Signed)
[facility] [address] [place], [place], [address] Phone: [phone]   Fax:  [phone]  Speech Language Pathology Treatment  Patient Details  Name: Collin [name] MRN: [id number] Date of Birth: [birth year] Referring Provider: Eppie Gibson MD  Encounter Date: 01/10/2017      End of Session - 01/10/17 1406    Visit Number 2   Number of Visits 3   Date for SLP Re-Evaluation 02/25/17   SLP Start Time 30   SLP Stop Time  1102   SLP Time Calculation (min) 40 min   Activity Tolerance Patient tolerated treatment well      Past Medical History:  Diagnosis Date  . Atrial fibrillation ([place])   . Cardiomyopathy, nonischemic ([place]) 1998  . Diabetes mellitus   . Heart disease   . Hyperlipidemia   . OSA (obstructive sleep apnea)   . Shingles   . 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 It sometimes shows as a shadow on scans.     Past Surgical History:  Procedure Laterality Date  . CARDIAC DEFIBRILLATOR PLACEMENT  2006  . CATARACT EXTRACTION Bilateral    01/07/2016. 01/22/2016  . CYST EXCISION     multiple drainage to a cyst in his neck.   [facility]land Kitchen DIRECT LARYNGOSCOPY N/A 11/16/2016   Procedure: DIRECT LARYNGOSCOPY AND BIOPSY;  Surgeon: Rozetta Nunnery, MD;  Location: [facility];  Service: ENT;  Laterality: N/A;  . HERNIA REPAIR     1950's  . IMPLANTABLE CARDIOVERTER DEFIBRILLATOR (ICD) GENERATOR CHANGE N/A 05/10/2014   Procedure: ICD GENERATOR CHANGE;  Surgeon: [name], MD;  Location: [facility] CATH LAB;  Service: Cardiovascular;  Laterality: N/A;  . IR GASTROSTOMY TUBE MOD SED  12/27/2016  . MASS EXCISION Right 11/16/2016   Procedure: EXCISION RIGHT NECK LIPOMA;  Surgeon: Rozetta Nunnery, MD;  Location: [facility];  Service: ENT;  Laterality: Right;  . MOHS SURGERY  01/01/2015   to top of head  . MOUTH SURGERY     teeth removed 4 teeth extracted 02/2010    There were  no vitals filed for this visit.      Subjective Assessment - 01/10/17 1032    Subjective Pt with PO solids three times last week - pureed.   Patient is accompained by: --  wife               ADULT SLP TREATMENT - 01/10/17 1034      General Information   Behavior/Cognition Alert;Cooperative;Pleasant mood     Treatment Provided   Treatment provided Dysphagia     Dysphagia Treatment   Temperature Spikes Noted No   Treatment Methods Skilled observation;Therapeutic exercise;Compensation strategy training;Patient/caregiver education   Patient observed directly with PO's Yes   Type of PO's observed Thin liquids   Pharyngeal Phase Signs & Symptoms Immediate cough   Other treatment/comments Pt reports he is drinking liquified solids (e.g., chicken and dumplings-liquified) and having Ensure. SLP encouraged Ensure as pt dos not like the liquified pureeds. Pt has not only minimally compliant with the HEP ("I don't remember how to do any of them"), Pt's wife told SLP why pt needs to complete HEP, pt did so with total A from wife, Pt wife stated pt refuses help with HEP. SLP reiterated to pt the need to complete HEP as prescribed. Pt did not recall he only drinks with straw at home. Pt completed HEP with  Malignant neoplasm of base of tongue ([place]) 11/24/2016  . Umbilical hernia without obstruction and without gangrene   . Pressure injury of skin 11/14/2016  . Tongue mass: Per Ct neck 11/10/2016 11/12/2016  . SBO (small bowel obstruction) ([place]) 11/11/2016  . Encounter for therapeutic drug monitoring 10/25/2013  . Diabetic peripheral neuropathy ([place]) 10/14/2011  . Hypotension 08/20/2011  . Acute renal failure ([place]) 08/20/2011  . OSA (obstructive sleep apnea) 04/15/2011  . ICD-[employer] 03/30/2011  . Chronic systolic CHF (congestive heart failure) ([place]) 03/30/2011  . Atrial fibrillation with RVR ([place]) 12/14/2010  . Nonischemic cardiomyopathy ([place]) 12/14/2010  . Type 2 diabetes mellitus ([place]) 12/14/2010  . Hyperlipidemia 12/14/2010    [place] ,[place], [place]  01/10/2017, 2:09 PM  [facility] [address] [place], [place], [address] Phone: [phone]   Fax:  [phone]   Name: Collin [name] MRN: [id number] Date of Birth: 11/13/1940  Malignant neoplasm of base of tongue ([place]) 11/24/2016  . Umbilical hernia without obstruction and without gangrene   . Pressure injury of skin 11/14/2016  . Tongue mass: Per Ct neck 11/10/2016 11/12/2016  . SBO (small bowel obstruction) ([place]) 11/11/2016  . Encounter for therapeutic drug monitoring 10/25/2013  . Diabetic peripheral neuropathy ([place]) 10/14/2011  . Hypotension 08/20/2011  . Acute renal failure ([place]) 08/20/2011  . OSA (obstructive sleep apnea) 04/15/2011  . ICD-[employer] 03/30/2011  . Chronic systolic CHF (congestive heart failure) ([place]) 03/30/2011  . Atrial fibrillation with RVR ([place]) 12/14/2010  . Nonischemic cardiomyopathy ([place]) 12/14/2010  . Type 2 diabetes mellitus ([place]) 12/14/2010  . Hyperlipidemia 12/14/2010    [place] ,[place], [place]  01/10/2017, 2:09 PM  [facility] [address] [place], [place], [address] Phone: [phone]   Fax:  [phone]   Name: Collin [name] MRN: [id number] Date of Birth: 11/13/1940

## 2017-01-11 ENCOUNTER — Ambulatory Visit
Admission: RE | Admit: 2017-01-11 | Discharge: 2017-01-11 | Disposition: A | Discharge status: Home / Self Care | Payer: Medicare HMO | Source: Ambulatory Visit | Attending: Radiation Oncology | Admitting: Radiation Oncology

## 2017-01-11 DIAGNOSIS — C01 Malignant neoplasm of base of tongue: Secondary | ICD-10-CM | POA: Diagnosis not present

## 2017-01-11 DIAGNOSIS — I4891 Unspecified atrial fibrillation: Secondary | ICD-10-CM | POA: Diagnosis not present

## 2017-01-11 DIAGNOSIS — Z7901 Long term (current) use of anticoagulants: Secondary | ICD-10-CM | POA: Diagnosis not present

## 2017-01-11 DIAGNOSIS — R634 Abnormal weight loss: Secondary | ICD-10-CM | POA: Diagnosis not present

## 2017-01-11 DIAGNOSIS — I428 Other cardiomyopathies: Secondary | ICD-10-CM | POA: Diagnosis not present

## 2017-01-11 DIAGNOSIS — Z79899 Other long term (current) drug therapy: Secondary | ICD-10-CM | POA: Diagnosis not present

## 2017-01-11 DIAGNOSIS — Z510 Encounter for antineoplastic radiation therapy: Secondary | ICD-10-CM | POA: Diagnosis not present

## 2017-01-11 DIAGNOSIS — E119 Type 2 diabetes mellitus without complications: Secondary | ICD-10-CM | POA: Diagnosis not present

## 2017-01-11 DIAGNOSIS — Z95810 Presence of automatic (implantable) cardiac defibrillator: Secondary | ICD-10-CM | POA: Diagnosis not present

## 2017-01-12 ENCOUNTER — Ambulatory Visit: Payer: Medicare HMO | Admitting: Nutrition

## 2017-01-12 ENCOUNTER — Ambulatory Visit
Admission: RE | Admit: 2017-01-12 | Discharge: 2017-01-12 | Disposition: A | Discharge status: Home / Self Care | Payer: Medicare HMO | Source: Ambulatory Visit | Attending: Radiation Oncology | Admitting: Radiation Oncology

## 2017-01-12 DIAGNOSIS — E119 Type 2 diabetes mellitus without complications: Secondary | ICD-10-CM | POA: Diagnosis not present

## 2017-01-12 DIAGNOSIS — I428 Other cardiomyopathies: Secondary | ICD-10-CM | POA: Diagnosis not present

## 2017-01-12 DIAGNOSIS — Z79899 Other long term (current) drug therapy: Secondary | ICD-10-CM | POA: Diagnosis not present

## 2017-01-12 DIAGNOSIS — I4891 Unspecified atrial fibrillation: Secondary | ICD-10-CM | POA: Diagnosis not present

## 2017-01-12 DIAGNOSIS — C01 Malignant neoplasm of base of tongue: Secondary | ICD-10-CM | POA: Diagnosis not present

## 2017-01-12 DIAGNOSIS — Z95810 Presence of automatic (implantable) cardiac defibrillator: Secondary | ICD-10-CM | POA: Diagnosis not present

## 2017-01-12 DIAGNOSIS — R634 Abnormal weight loss: Secondary | ICD-10-CM | POA: Diagnosis not present

## 2017-01-12 DIAGNOSIS — Z510 Encounter for antineoplastic radiation therapy: Secondary | ICD-10-CM | POA: Diagnosis not present

## 2017-01-12 DIAGNOSIS — Z7901 Long term (current) use of anticoagulants: Secondary | ICD-10-CM | POA: Diagnosis not present

## 2017-01-12 NOTE — Progress Notes (Signed)
Nutrition follow-up completed with patient status post radiation therapy for tongue cancer. Weight decreased and documented as 247 pounds, May ninth down from 254.2 pounds May first. Patient reports he is only tolerating 3 Ensure Plus by mouth daily.  In addition, he is using his feeding tube and has been instilling 3 bottles of Osmolite 1.5 via PEG. He reports difficulty tolerating blenderized food by mouth. He is flushing his feeding tube before and after bolus feedings with 60 mL of water Patient denies nausea, vomiting, constipation, diarrhea.  Nutrition diagnosis: Predicted suboptimal energy intake has evolved into inadequate oral intake related to tongue cancer and associated treatments as evidenced by 7 pound weight loss in one week.  Intervention: Patient educated to increase Osmolite 1.5-1-1/2 bottles, 3 times a day at mealtimes with 60 mL free water before and after bolus feedings. In addition, patient will consume at least 3 bottles of Ensure Plus by mouth. Encouraged patient to continue to drink water by mouth,  reminding him he needs approximately 48 ounces daily. Questions were answered.  Teach back method used.  Monitoring, evaluation, goals: Patient will tolerate increased oral intake to minimize further weight loss.  Next visit: Wednesday, May 16 after radiation therapy.  **Disclaimer: This note was dictated with voice recognition software. Similar sounding words can inadvertently be transcribed and this note may contain transcription errors which may not have been corrected upon publication of note.**

## 2017-01-13 ENCOUNTER — Ambulatory Visit
Admission: RE | Admit: 2017-01-13 | Discharge: 2017-01-13 | Disposition: A | Discharge status: Home / Self Care | Payer: Medicare HMO | Source: Ambulatory Visit | Attending: Radiation Oncology | Admitting: Radiation Oncology

## 2017-01-13 DIAGNOSIS — R634 Abnormal weight loss: Secondary | ICD-10-CM | POA: Diagnosis not present

## 2017-01-13 DIAGNOSIS — Z79899 Other long term (current) drug therapy: Secondary | ICD-10-CM | POA: Diagnosis not present

## 2017-01-13 DIAGNOSIS — I428 Other cardiomyopathies: Secondary | ICD-10-CM | POA: Diagnosis not present

## 2017-01-13 DIAGNOSIS — I4891 Unspecified atrial fibrillation: Secondary | ICD-10-CM | POA: Diagnosis not present

## 2017-01-13 DIAGNOSIS — Z7901 Long term (current) use of anticoagulants: Secondary | ICD-10-CM | POA: Diagnosis not present

## 2017-01-13 DIAGNOSIS — Z95810 Presence of automatic (implantable) cardiac defibrillator: Secondary | ICD-10-CM | POA: Diagnosis not present

## 2017-01-13 DIAGNOSIS — E119 Type 2 diabetes mellitus without complications: Secondary | ICD-10-CM | POA: Diagnosis not present

## 2017-01-13 DIAGNOSIS — Z510 Encounter for antineoplastic radiation therapy: Secondary | ICD-10-CM | POA: Diagnosis not present

## 2017-01-13 DIAGNOSIS — C01 Malignant neoplasm of base of tongue: Secondary | ICD-10-CM | POA: Diagnosis not present

## 2017-01-14 ENCOUNTER — Ambulatory Visit
Admission: RE | Admit: 2017-01-14 | Discharge: 2017-01-14 | Disposition: A | Discharge status: Home / Self Care | Payer: Medicare HMO | Source: Ambulatory Visit | Attending: Radiation Oncology | Admitting: Radiation Oncology

## 2017-01-14 DIAGNOSIS — Z79899 Other long term (current) drug therapy: Secondary | ICD-10-CM | POA: Diagnosis not present

## 2017-01-14 DIAGNOSIS — Z95810 Presence of automatic (implantable) cardiac defibrillator: Secondary | ICD-10-CM | POA: Diagnosis not present

## 2017-01-14 DIAGNOSIS — Z510 Encounter for antineoplastic radiation therapy: Secondary | ICD-10-CM | POA: Diagnosis not present

## 2017-01-14 DIAGNOSIS — I4891 Unspecified atrial fibrillation: Secondary | ICD-10-CM | POA: Diagnosis not present

## 2017-01-14 DIAGNOSIS — C01 Malignant neoplasm of base of tongue: Secondary | ICD-10-CM | POA: Diagnosis not present

## 2017-01-14 DIAGNOSIS — R634 Abnormal weight loss: Secondary | ICD-10-CM | POA: Diagnosis not present

## 2017-01-14 DIAGNOSIS — I428 Other cardiomyopathies: Secondary | ICD-10-CM | POA: Diagnosis not present

## 2017-01-14 DIAGNOSIS — E119 Type 2 diabetes mellitus without complications: Secondary | ICD-10-CM | POA: Diagnosis not present

## 2017-01-14 DIAGNOSIS — Z7901 Long term (current) use of anticoagulants: Secondary | ICD-10-CM | POA: Diagnosis not present

## 2017-01-17 ENCOUNTER — Ambulatory Visit
Admission: RE | Admit: 2017-01-17 | Discharge: 2017-01-17 | Disposition: A | Discharge status: Home / Self Care | Payer: Medicare HMO | Source: Ambulatory Visit | Attending: Radiation Oncology | Admitting: Radiation Oncology

## 2017-01-17 ENCOUNTER — Other Ambulatory Visit: Payer: Self-pay | Admitting: Radiation Oncology

## 2017-01-17 ENCOUNTER — Encounter (HOSPITAL_COMMUNITY): Payer: Self-pay | Admitting: *Deleted

## 2017-01-17 ENCOUNTER — Ambulatory Visit (HOSPITAL_COMMUNITY)
Admission: RE | Admit: 2017-01-17 | Discharge: 2017-01-17 | Disposition: A | Discharge status: Home / Self Care | Payer: Medicare HMO | Source: Ambulatory Visit | Attending: Radiation Oncology | Admitting: Radiation Oncology

## 2017-01-17 ENCOUNTER — Ambulatory Visit: Payer: Medicare HMO

## 2017-01-17 DIAGNOSIS — R633 Feeding difficulties, unspecified: Secondary | ICD-10-CM

## 2017-01-17 DIAGNOSIS — Z7901 Long term (current) use of anticoagulants: Secondary | ICD-10-CM | POA: Diagnosis not present

## 2017-01-17 DIAGNOSIS — I428 Other cardiomyopathies: Secondary | ICD-10-CM | POA: Diagnosis not present

## 2017-01-17 DIAGNOSIS — C01 Malignant neoplasm of base of tongue: Secondary | ICD-10-CM | POA: Diagnosis not present

## 2017-01-17 DIAGNOSIS — I4891 Unspecified atrial fibrillation: Secondary | ICD-10-CM | POA: Diagnosis not present

## 2017-01-17 DIAGNOSIS — R634 Abnormal weight loss: Secondary | ICD-10-CM | POA: Diagnosis not present

## 2017-01-17 DIAGNOSIS — K148 Other diseases of tongue: Secondary | ICD-10-CM

## 2017-01-17 DIAGNOSIS — R220 Localized swelling, mass and lump, head: Secondary | ICD-10-CM

## 2017-01-17 DIAGNOSIS — Z79899 Other long term (current) drug therapy: Secondary | ICD-10-CM | POA: Diagnosis not present

## 2017-01-17 DIAGNOSIS — Z95810 Presence of automatic (implantable) cardiac defibrillator: Secondary | ICD-10-CM | POA: Diagnosis not present

## 2017-01-17 DIAGNOSIS — E119 Type 2 diabetes mellitus without complications: Secondary | ICD-10-CM | POA: Diagnosis not present

## 2017-01-17 DIAGNOSIS — Z510 Encounter for antineoplastic radiation therapy: Secondary | ICD-10-CM | POA: Diagnosis not present

## 2017-01-17 HISTORY — PX: IR PATIENT EVAL TECH 0-60 MINS: IMG5564

## 2017-01-17 LAB — BASIC METABOLIC PANEL
Anion Gap: 9 mEq/L (ref 3–11)
BUN: 17.2 mg/dL (ref 7.0–26.0)
CO2: 29 mEq/L (ref 22–29)
Calcium: 9.8 mg/dL (ref 8.4–10.4)
Chloride: 100 mEq/L (ref 98–109)
Creatinine: 0.7 mg/dL (ref 0.7–1.3)
EGFR: >90 mL/min/{1.73_m2} (ref >90)
Glucose: 112 mg/dl (ref 70–140)
Potassium: 4.4 mEq/L (ref 3.5–5.1)
Sodium: 138 mEq/L (ref 136–145)

## 2017-01-17 MED ORDER — OXYCODONE HCL 5 MG PO TABS: 5.0000 mg | ORAL_TABLET | ORAL | 0 refills | Status: DC | PRN

## 2017-01-17 MED ORDER — SCOPOLAMINE 1 MG/3DAYS TD PT72: 1.0000 | MEDICATED_PATCH | TRANSDERMAL | 5 refills | Status: DC

## 2017-01-17 MED ORDER — SILVER SULFADIAZINE 1 % EX CREA
TOPICAL_CREAM | Freq: Two times a day (BID) | CUTANEOUS | Status: DC
  Administered 2017-01-17: 09:00:00 via TOPICAL

## 2017-01-17 NOTE — Procedures (Signed)
Patient and his family came in today with c/o swollen , red and painful g tube insertion site.  I evaluated the catheter and noticed that the bumper was very tight.  I was able to loosen the bumper and the patient got immediate relief.  The site and surrounding area were red.  Ascencion Dike PA evaluated the site and did not feel that the site looked infected.  The patient and family was advised to keep the bumper at the 4 cm mark on the tube and a single split gauze under the bumper to protect the skin from further irritation. They will contact us with any further problems with the tube.

## 2017-01-18 ENCOUNTER — Ambulatory Visit (HOSPITAL_BASED_OUTPATIENT_CLINIC_OR_DEPARTMENT_OTHER): Payer: Medicare HMO | Admitting: Hematology and Oncology

## 2017-01-18 ENCOUNTER — Ambulatory Visit
Admission: RE | Admit: 2017-01-18 | Discharge: 2017-01-18 | Disposition: A | Discharge status: Home / Self Care | Payer: Medicare HMO | Source: Ambulatory Visit | Attending: Radiation Oncology | Admitting: Radiation Oncology

## 2017-01-18 ENCOUNTER — Encounter: Payer: Self-pay | Admitting: Hematology and Oncology

## 2017-01-18 DIAGNOSIS — I4891 Unspecified atrial fibrillation: Secondary | ICD-10-CM | POA: Diagnosis not present

## 2017-01-18 DIAGNOSIS — Z95810 Presence of automatic (implantable) cardiac defibrillator: Secondary | ICD-10-CM | POA: Diagnosis not present

## 2017-01-18 DIAGNOSIS — I428 Other cardiomyopathies: Secondary | ICD-10-CM | POA: Diagnosis not present

## 2017-01-18 DIAGNOSIS — C01 Malignant neoplasm of base of tongue: Secondary | ICD-10-CM

## 2017-01-18 DIAGNOSIS — E119 Type 2 diabetes mellitus without complications: Secondary | ICD-10-CM | POA: Diagnosis not present

## 2017-01-18 DIAGNOSIS — Z79899 Other long term (current) drug therapy: Secondary | ICD-10-CM | POA: Diagnosis not present

## 2017-01-18 DIAGNOSIS — R634 Abnormal weight loss: Secondary | ICD-10-CM | POA: Diagnosis not present

## 2017-01-18 DIAGNOSIS — Z7901 Long term (current) use of anticoagulants: Secondary | ICD-10-CM | POA: Diagnosis not present

## 2017-01-18 DIAGNOSIS — Z510 Encounter for antineoplastic radiation therapy: Secondary | ICD-10-CM | POA: Diagnosis not present

## 2017-01-18 NOTE — Assessment & Plan Note (Signed)
He is doing well with nutritional support through the feeding tube. He has mild concern around the feeding tube looking mildly erythematous.  Examination did not show conclusive infection. I reassured the patient and his wife. We will monitor the area carefully and I recommend dry dressing changes and consideration to change to a different tape

## 2017-01-18 NOTE — Assessment & Plan Note (Signed)
The neck mass has reduced in size. He is doing well  Continue close surveillance for toxicity review

## 2017-01-18 NOTE — Progress Notes (Signed)
[facility] OFF[facility] PROGRESS NOTE  Patient Care Team: Eulas Post, MD as PCP - General (Family Medicine) Eppie Gibson, MD as Attending Physician (Radiation Oncology) Heath Lark, MD as Consulting Physician (Hematology and Oncology) Leota Sauers, RN as Oncology Nurse Navigator Karie Mainland, RD as Dietitian (Nutrition) Jomarie Longs, PT as Physical Therapist (Physical Therapy) Sharen Counter, CCC-SLP as Speech Language Pathologist (Speech Pathology) Kennith Center, LCSW as Social Worker Rozetta Nunnery, MD as Consulting Physician (Otolaryngology)  SUMMARY OF ONCOLOGIC HISTORY:   Malignant neoplasm of base of tongue ([place])   11/08/2016 Pathology Results    INSUFFICIENT FOR DIAGNOSIS. The Specimen Is Processed And Examined Microscopically, But Found To Be UNSATISFACTORY For Evaluation. Diagnosis NONDIAGNOSTIC MATERIAL      11/09/2016 Imaging    CT abdomen: Umbilical hernia containing only peritoneal fat. No bowel obstruction. Chronic cholelithiasis. A gallstone is present on the prior study of 06/10/2014. 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      11/10/2016 Imaging    CT neck: Right base of tongue mass likely representing carcinoma. The mass measures up to 37 mm. Visible mass invades base of tongue 27 mm and extends 9 mm to contralateral base of tongue. 2. Right level 2, 3, 4, 5a/b necrotic lymphadenopathy with findings of extra nodal extension. Left level 2 lymphadenopathy      11/10/2016 - 11/17/2016 Hospital Admission    He was admitted for management of bowel obstruction, managed conservatively      11/16/2016 Pathology Results    1. Tongue, biopsy, Base - SQUAMOUS CELL CARCINOMA. - SEE MICROSCOPIC DESCRIPTION 2. Soft tissue, lipoma, Right Neck - BENIGN ADIPOSE TISSUE CONSISTENT WITH LIPOMA. - NO EVIDENCE OF MALIGNANCY. Microscopic Comment 1. Immunohistochemistry for p16 will be performed and reported as an  addendum. Addendum: Immunohistochemistry for p16 (HPV) shows diffuse strong positivity in the tumor.       11/16/2016 Surgery    OPERATION PERFORMED:  Direct laryngoscopy and biopsy of base of tongue mass (frozen section positive for squamous cell carcinoma).  Excision of large right neck lipoma, 10 x 12 cm in size.  SURGEON:  Leonides Sake. Lucia Gaskins, MD.  DESCRIPTION OF PROCEDURE:  Oral cavity was clear.  Base of tongue had a partially exophytic mass extending down to the vallecula.  The epiglottis was uninvolved.  On direct laryngoscopy of the laryngeal surface, epiglottis was clear.  AE folds were clear.  Vocal cords were clear.  Piriform sinuses were clear. Next, biopsies were obtained from the base of tongue and sent for frozen section.  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        11/22/2016 PET scan    Hypermetabolic base of tongue mass compatible with known neoplasm. 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 No evidence for distant metastatic disease. Aortic atherosclerosis and coronary artery calcification. Gallstones.      12/07/2016 -  Radiation Therapy    The patient has received radiation treatment       12/27/2016 Procedure    Successful 20 French pull-through gastrostomy       INTERVAL HISTORY: Please see below for problem oriented charting. He returns with his wife. His weight has been stable since his last visit He is using nutritional supplement in addition to occasional oral intake.  He denies pain or significant dysphagia.  His wife is concerned regarding discharge around the feeding tube. The patient denies any recent signs or symptoms of  bleeding such as spontaneous epistaxis, hematuria or hematochezia. He has significant skin rash from radiation changes and occasional bleeding around his neck  REVIEW OF SYSTEMS:   Constitutional: Denies fevers, chills or abnormal weight loss Eyes: Denies  blurriness of vision Ears, nose, mouth, throat, and face: Denies mucositis or sore throat Respiratory: Denies cough, dyspnea or wheezes Cardiovascular: Denies palpitation, chest discomfort or lower extremity swelling Gastrointestinal:  Denies nausea, heartburn or change in bowel habits Lymphatics: Denies new lymphadenopathy or easy bruising Neurological:Denies numbness, tingling or new weaknesses Behavioral/Psych: Mood is stable, no new changes  All other systems were reviewed with the patient and are negative.  I have reviewed the past medical history, past surgical history, social history and family history with the patient and they are unchanged from previous note.  ALLERGIES:  is allergic to niaspan [niacin er].  MEDICATIONS:  Current Outpatient Prescriptions  Medication Sig Dispense Refill  . acetaminophen (TYLENOL) 500 MG tablet Take 1,000 mg by mouth every 6 (six) hours as needed for moderate pain.    . carvedilol (COREG) 25 MG tablet TAKE 1 TABLET TWICE DAILY WITH MEALS 180 tablet 1  . digoxin (LANOXIN) 0.125 MG tablet Take 1 tablet (0.125 mg total) by mouth daily. 90 tablet 3  . diltiazem (CARDIZEM) 90 MG tablet Take 1 tablet (90 mg total) by mouth daily. 30 tablet 3  . emollient (BIAFINE) cream Apply topically as needed. 454 g 0  . Nutritional Supplements (FEEDING SUPPLEMENT, OSMOLITE 1.5 CAL,) LIQD Give 2 cans of formula at 8am and 12 noon. Give additional 1 1/2 cans at 4pm and 8pm feeding.   Flush with 8ml of water before and after feeding 4 times per day 1659 mL 0  . Nutritional Supplements (PROMOD) LIQD Give 9ml (1 oz) 2 times per day via tube. Flush with 75ml of water before and after given. 946 mL   . oxyCODONE (OXY IR/ROXICODONE) 5 MG immediate release tablet Take 1 tablet (5 mg total) by mouth every 4 (four) hours as needed for severe pain. 60 tablet 0  . polyethylene glycol (MIRALAX) packet Take 17 g by mouth daily as needed. Available OTC 30 each 0  . pravastatin  (PRAVACHOL) 40 MG tablet TAKE 1 TABLET EVERY DAY 90 tablet 2  . silver sulfADIAZINE (SILVADENE) 1 % cream Apply 1 application topically 2 (two) times daily. Apply a thin layer to affected skin twice per day but, not four hours prior to radiation therapy.    . sodium fluoride (PREVIDENT 5000 PLUS) 1.1 % CREA dental cream Apply gel to tooth brush. Brush teeth for 2 minutes. Spit out excess-DO NOT swallow. Repeat nightly. 1 Tube prn  . warfarin (COUMADIN) 5 MG tablet Take as directed by anticoagulation clinic. 105 tablet 1  . furosemide (LASIX) 20 MG tablet Take 1 tablet (20 mg total) by mouth daily. (Patient not taking: Reported on 12/28/2016) 30 tablet 3  . lidocaine (XYLOCAINE) 2 % solution Patient: Mix 1part 2% viscous lidocaine, 1part H20. Swish and swallow 29mL of this mixture, 12min before meals and at bedtime, up to QID (Patient not taking: Reported on 12/28/2016) 100 mL 5  . metFORMIN (GLUCOPHAGE) 500 MG tablet TAKE 1 TABLET TWICE DAILY WITH MEALS (Patient not taking: Reported on 12/28/2016) 180 tablet 0  . ondansetron (ZOFRAN ODT) 4 MG disintegrating tablet Take 1 tablet (4 mg total) by mouth every 8 (eight) hours as needed for nausea or vomiting. (Patient not taking: Reported on 12/28/2016) 20 tablet 0   No current facility-administered  medications for this visit.     PHYSICAL EXAMINATION: ECOG PERFORMANCE STATUS: 1 - Symptomatic but completely ambulatory  Vitals:   01/18/17 0848  BP: 101/71  Pulse: 65  Resp: 18  Temp: 98.7 F (37.1 C)   Filed Weights   01/18/17 0848  Weight: 247 lb 3.2 oz (112.1 kg)    GENERAL:alert, no distress and comfortable SKIN: No other areas of erythema around his neck EYES: normal, Conjunctiva are pink and non-injected, sclera clear OROPHARYNX:no exudate, no erythema and lips, buccal mucosa, and tongue normal.  No thrush NECK: supple, thyroid normal size, non-tender, without nodularity LYMPH: He has persistent enlarged lymph node in the neck,  stable LUNGS: clear to auscultation and percussion with normal breathing effort HEART: regular rate & rhythm and no murmurs and no lower extremity edema ABDOMEN:abdomen soft, non-tender and normal bowel sounds.  There is area of redness around the feeding tube but does not look like cellulitis. Musculoskeletal:no cyanosis of digits and no clubbing  NEURO: alert & oriented x 3 with fluent speech, no focal motor/sensory deficits  LABORATORY DATA:  I have reviewed the data as listed    Component Value Date/Time   NA 138 01/17/2017 1125   K 4.4 01/17/2017 1125   CL 99 (L) 12/27/2016 1137   CO2 29 01/17/2017 1125   GLUCOSE 112 01/17/2017 1125   BUN 17.2 01/17/2017 1125   CREATININE 0.7 01/17/2017 1125   CALCIUM 9.8 01/17/2017 1125   PROT 7.9 12/21/2016 1423   ALBUMIN 3.4 (L) 12/21/2016 1423   AST 19 12/21/2016 1423   ALT 23 12/21/2016 1423   ALKPHOS 119 12/21/2016 1423   BILITOT 0.80 12/21/2016 1423   GFRNONAA >60 12/27/2016 1137   GFRAA >60 12/27/2016 1137    No results found for: SPEP, UPEP  Lab Results  Component Value Date   WBC 4.3 12/27/2016   NEUTROABS 3.6 12/27/2016   HGB 13.3 12/27/2016   HCT 39.8 12/27/2016   MCV 86.3 12/27/2016   PLT 227 12/27/2016      Chemistry      Component Value Date/Time   NA 138 01/17/2017 1125   K 4.4 01/17/2017 1125   CL 99 (L) 12/27/2016 1137   CO2 29 01/17/2017 1125   BUN 17.2 01/17/2017 1125   CREATININE 0.7 01/17/2017 1125      Component Value Date/Time   CALCIUM 9.8 01/17/2017 1125   ALKPHOS 119 12/21/2016 1423   AST 19 12/21/2016 1423   ALT 23 12/21/2016 1423   BILITOT 0.80 12/21/2016 1423       RADIOGRAPHIC STUDIES: I have personally reviewed the radiological images as listed and agreed with the findings in the report. Ir Gastrostomy Tube Mod Sed  Result Date: 12/27/2016 INDICATION: Tongue cancer EXAM: PERC PLACEMENT GASTROSTOMY MEDICATIONS: Ancef 2 g; Antibiotics were administered within 1 hour of the procedure.  Glucagon 1 mg IV ANESTHESIA/SEDATION: Versed 2 mg IV; Fentanyl 100 mcg IV Moderate Sedation Time:  20 The patient was continuously monitored during the procedure by the interventional radiology nurse under my direct supervision. CONTRAST:  82mL ISOVUE-300 IOPAMIDOL (ISOVUE-300) INJECTION 61%, 33mL ISOVUE-300 IOPAMIDOL (ISOVUE-300) INJECTION 61% - administered into the gastric lumen. FLUOROSCOPY TIME:  Fluoroscopy Time: 3 minutes 30 seconds (107 mGy). COMPLICATIONS: None immediate. PROCEDURE: The procedure, risks, benefits, and alternatives were explained to the patient. Questions regarding the procedure were encouraged and answered. The patient understands and consents to the procedure. The epigastrium was prepped with Betadine in a sterile fashion, and a sterile drape was  applied covering the operative field. A sterile gown and sterile gloves were used for the procedure. A 5-French orogastric tube is placed under fluoroscopic guidance. Scout imaging of the abdomen confirms barium within the transverse colon. The stomach was distended with gas. Under fluoroscopic guidance, an 18 gauge needle was utilized to puncture the anterior wall of the body of the stomach. An Amplatz wire was advanced through the needle passing a T fastener into the lumen of the stomach. The T fastener was secured for gastropexy. A 9-French sheath was inserted. A snare was advanced through the 9-French sheath. A Britta Mccreedy was advanced through the orogastric tube. It was snared then pulled out the oral cavity, pulling the snare, as well. The leading edge of the gastrostomy was attached to the snare. It was then pulled down the esophagus and out the percutaneous site. It was secured in place. Contrast was injected. The image demonstrates placement of a 20-French pull-through type gastrostomy tube into the body of the stomach. IMPRESSION: Successful 20 French pull-through gastrostomy. Electronically Signed   By: Marybelle Killings M.D.   On: 12/27/2016  15:21   Ir Patient Eval Tech 0-60 Mins  Result Date: 01/17/2017 Chipper [place]     01/17/2017  9:53 AM Patient and his family came in today with c/o swollen , red and painful g tube insertion site.  I evaluated the catheter and noticed that the bumper was very tight.  I was able to loosen the bumper and the patient got immediate relief.  The site and surrounding area were red.  Ascencion Dike PA evaluated the site and did not feel that the site looked infected.  The patient and family was advised to keep the bumper at the 4 cm mark on the tube and a single split gauze under the bumper to protect the skin from further irritation. They will contact us with any further problems with the tube.   ASSESSMENT & PLAN:  Malignant neoplasm of base of tongue (HCC) The neck mass has reduced in size. He is doing well  Continue close surveillance for toxicity review  Nonischemic cardiomyopathy ([place]) Clinically, he has no signs or symptoms of congestive heart failure apart from bilateral leg edema which is chronic.  He will continue maximum medication as directed by his cardiologist  Weight loss, abnormal He is doing well with nutritional support through the feeding tube. He has mild concern around the feeding tube looking mildly erythematous.  Examination did not show conclusive infection. I reassured the patient and his wife. We will monitor the area carefully and I recommend dry dressing changes and consideration to change to a different tape   No orders of the defined types were placed in this encounter.  All questions were answered. The patient knows to call the clinic with any problems, questions or concerns. No barriers to learning was detected. I spent 15 minutes counseling the patient face to face. The total time spent in the appointment was 20 minutes and more than 50% was on counseling and review of test results     Heath Lark, MD 01/18/2017 12:38 PM

## 2017-01-18 NOTE — Assessment & Plan Note (Signed)
Clinically, he has no signs or symptoms of congestive heart failure apart from bilateral leg edema which is chronic.  He will continue maximum medication as directed by his cardiologist

## 2017-01-19 ENCOUNTER — Ambulatory Visit: Payer: Medicare HMO | Admitting: Nutrition

## 2017-01-19 ENCOUNTER — Ambulatory Visit
Admission: RE | Admit: 2017-01-19 | Discharge: 2017-01-19 | Disposition: A | Discharge status: Home / Self Care | Payer: Medicare HMO | Source: Ambulatory Visit | Attending: Radiation Oncology | Admitting: Radiation Oncology

## 2017-01-19 DIAGNOSIS — E119 Type 2 diabetes mellitus without complications: Secondary | ICD-10-CM | POA: Diagnosis not present

## 2017-01-19 DIAGNOSIS — Z7901 Long term (current) use of anticoagulants: Secondary | ICD-10-CM | POA: Diagnosis not present

## 2017-01-19 DIAGNOSIS — Z95810 Presence of automatic (implantable) cardiac defibrillator: Secondary | ICD-10-CM | POA: Diagnosis not present

## 2017-01-19 DIAGNOSIS — I4891 Unspecified atrial fibrillation: Secondary | ICD-10-CM | POA: Diagnosis not present

## 2017-01-19 DIAGNOSIS — R634 Abnormal weight loss: Secondary | ICD-10-CM | POA: Diagnosis not present

## 2017-01-19 DIAGNOSIS — Z79899 Other long term (current) drug therapy: Secondary | ICD-10-CM | POA: Diagnosis not present

## 2017-01-19 DIAGNOSIS — Z510 Encounter for antineoplastic radiation therapy: Secondary | ICD-10-CM | POA: Diagnosis not present

## 2017-01-19 DIAGNOSIS — I428 Other cardiomyopathies: Secondary | ICD-10-CM | POA: Diagnosis not present

## 2017-01-19 DIAGNOSIS — C01 Malignant neoplasm of base of tongue: Secondary | ICD-10-CM | POA: Diagnosis not present

## 2017-01-19 NOTE — Progress Notes (Signed)
Nutrition follow-up completed with patient status post radiation therapy for tongue cancer. Patient's final radiation therapy is Monday, May 21. Weight is stable at 247.2 pounds on May 15. Patient appears to be tolerating 3-4 bottles of Osmolite 1.5/Ensure Plus.  He is drinking a lot of water by mouth. Denies nausea and vomiting. Reports he has small bowel movements and does not feel constipated.  Nutrition diagnosis: Inadequate oral intake continues.  Intervention: Educated patient to increase Osmolite 1.5-1-1/2 bottles 4 times a day with 60 mL free water before and after bolus feeding.  Encouraged patient to continue Ensure Plus by mouth as tolerated. Encouraged patient to continue swallowing exercises. Patient requires 48 ounces of water daily.  By mouth/or tube. Provided support and encouragement.  Teach back method used.  Monitoring, evaluation, goals:  Patient will work to increase oral intake for tube feeding to minimize weight loss and promote healing.  Next visit: Monday, May 21.  After radiation therapy.  **Disclaimer: This note was dictated with voice recognition software. Similar sounding words can inadvertently be transcribed and this note may contain transcription errors which may not have been corrected upon publication of note.**

## 2017-01-20 ENCOUNTER — Ambulatory Visit
Admission: RE | Admit: 2017-01-20 | Discharge: 2017-01-20 | Disposition: A | Discharge status: Home / Self Care | Payer: Medicare HMO | Source: Ambulatory Visit | Attending: Radiation Oncology | Admitting: Radiation Oncology

## 2017-01-20 DIAGNOSIS — Z510 Encounter for antineoplastic radiation therapy: Secondary | ICD-10-CM | POA: Diagnosis not present

## 2017-01-20 DIAGNOSIS — Z95810 Presence of automatic (implantable) cardiac defibrillator: Secondary | ICD-10-CM | POA: Diagnosis not present

## 2017-01-20 DIAGNOSIS — Z7901 Long term (current) use of anticoagulants: Secondary | ICD-10-CM | POA: Diagnosis not present

## 2017-01-20 DIAGNOSIS — E119 Type 2 diabetes mellitus without complications: Secondary | ICD-10-CM | POA: Diagnosis not present

## 2017-01-20 DIAGNOSIS — I4891 Unspecified atrial fibrillation: Secondary | ICD-10-CM | POA: Diagnosis not present

## 2017-01-20 DIAGNOSIS — R634 Abnormal weight loss: Secondary | ICD-10-CM | POA: Diagnosis not present

## 2017-01-20 DIAGNOSIS — I428 Other cardiomyopathies: Secondary | ICD-10-CM | POA: Diagnosis not present

## 2017-01-20 DIAGNOSIS — C01 Malignant neoplasm of base of tongue: Secondary | ICD-10-CM | POA: Diagnosis not present

## 2017-01-20 DIAGNOSIS — Z79899 Other long term (current) drug therapy: Secondary | ICD-10-CM | POA: Diagnosis not present

## 2017-01-21 ENCOUNTER — Ambulatory Visit: Payer: Medicare HMO

## 2017-01-21 ENCOUNTER — Ambulatory Visit
Admission: RE | Admit: 2017-01-21 | Discharge: 2017-01-21 | Disposition: A | Discharge status: Home / Self Care | Payer: Medicare HMO | Source: Ambulatory Visit | Attending: Radiation Oncology | Admitting: Radiation Oncology

## 2017-01-21 DIAGNOSIS — I428 Other cardiomyopathies: Secondary | ICD-10-CM | POA: Diagnosis not present

## 2017-01-21 DIAGNOSIS — R634 Abnormal weight loss: Secondary | ICD-10-CM | POA: Diagnosis not present

## 2017-01-21 DIAGNOSIS — Z95810 Presence of automatic (implantable) cardiac defibrillator: Secondary | ICD-10-CM | POA: Diagnosis not present

## 2017-01-21 DIAGNOSIS — C01 Malignant neoplasm of base of tongue: Secondary | ICD-10-CM | POA: Diagnosis not present

## 2017-01-21 DIAGNOSIS — Z510 Encounter for antineoplastic radiation therapy: Secondary | ICD-10-CM | POA: Diagnosis not present

## 2017-01-21 DIAGNOSIS — E119 Type 2 diabetes mellitus without complications: Secondary | ICD-10-CM | POA: Diagnosis not present

## 2017-01-21 DIAGNOSIS — Z79899 Other long term (current) drug therapy: Secondary | ICD-10-CM | POA: Diagnosis not present

## 2017-01-21 DIAGNOSIS — I4891 Unspecified atrial fibrillation: Secondary | ICD-10-CM | POA: Diagnosis not present

## 2017-01-21 DIAGNOSIS — Z7901 Long term (current) use of anticoagulants: Secondary | ICD-10-CM | POA: Diagnosis not present

## 2017-01-22 ENCOUNTER — Ambulatory Visit: Payer: Medicare HMO

## 2017-01-24 ENCOUNTER — Encounter: Payer: Self-pay | Admitting: Radiation Oncology

## 2017-01-24 ENCOUNTER — Ambulatory Visit
Admission: RE | Admit: 2017-01-24 | Discharge: 2017-01-24 | Disposition: A | Discharge status: Home / Self Care | Payer: Medicare HMO | Source: Ambulatory Visit | Attending: Radiation Oncology | Admitting: Radiation Oncology

## 2017-01-24 ENCOUNTER — Other Ambulatory Visit: Payer: Self-pay | Admitting: Radiation Oncology

## 2017-01-24 ENCOUNTER — Ambulatory Visit: Payer: Medicare HMO | Admitting: Nutrition

## 2017-01-24 ENCOUNTER — Encounter: Payer: Self-pay | Admitting: *Deleted

## 2017-01-24 VITALS — BP 110/71 | HR 91 | Temp 97.8°F | Ht 72.0 in | Wt 244.8 lb

## 2017-01-24 DIAGNOSIS — R634 Abnormal weight loss: Secondary | ICD-10-CM | POA: Diagnosis not present

## 2017-01-24 DIAGNOSIS — E119 Type 2 diabetes mellitus without complications: Secondary | ICD-10-CM | POA: Diagnosis not present

## 2017-01-24 DIAGNOSIS — I4891 Unspecified atrial fibrillation: Secondary | ICD-10-CM | POA: Diagnosis not present

## 2017-01-24 DIAGNOSIS — C01 Malignant neoplasm of base of tongue: Secondary | ICD-10-CM | POA: Diagnosis not present

## 2017-01-24 DIAGNOSIS — Z79899 Other long term (current) drug therapy: Secondary | ICD-10-CM | POA: Diagnosis not present

## 2017-01-24 DIAGNOSIS — Z7901 Long term (current) use of anticoagulants: Secondary | ICD-10-CM | POA: Diagnosis not present

## 2017-01-24 DIAGNOSIS — Z95810 Presence of automatic (implantable) cardiac defibrillator: Secondary | ICD-10-CM | POA: Diagnosis not present

## 2017-01-24 DIAGNOSIS — Z510 Encounter for antineoplastic radiation therapy: Secondary | ICD-10-CM | POA: Diagnosis not present

## 2017-01-24 DIAGNOSIS — I428 Other cardiomyopathies: Secondary | ICD-10-CM | POA: Diagnosis not present

## 2017-01-24 MED ORDER — BIAFINE EX EMUL
CUTANEOUS | Status: DC | PRN
  Administered 2017-01-24: 09:00:00 via TOPICAL

## 2017-01-24 MED ORDER — SILVER SULFADIAZINE 1 % EX CREA
TOPICAL_CREAM | Freq: Two times a day (BID) | CUTANEOUS | Status: DC
  Administered 2017-01-24: 09:00:00 via TOPICAL

## 2017-01-24 NOTE — Progress Notes (Signed)
   Weekly Management Note:  Outpatient    ICD-9-CM ICD-10-CM   1. Malignant neoplasm of base of tongue ([facility]) 141.0 C01 topical emolient (BIAFINE) emulsion     silver sulfADIAZINE (SILVADENE) 1 % cream    Current Dose:  70 Gy  Projected Dose: 70 Gy   Narrative:  The patient presents for routine under treatment assessment.  CBCT/MVCT images/Port film x-rays were reviewed.  The chart was checked. Doing well.  Sees nutritionist today.  Weight down 1 lb from last week.  Uses pain meds intermittently with good control  Physical Findings:  Wt Readings from Last 3 Encounters:  01/24/17 244 lb 12.8 oz (111 kg)  01/18/17 247 lb 3.2 oz (112.1 kg)  01/12/17 247 lb (112 kg)    height is 6' (1.829 m) and weight is 244 lb 12.8 oz (111 kg). His temperature is 97.8 F (36.6 C). His blood pressure is 110/71 and his pulse is 91. His oxygen saturation is 96%.  confluent mucositis, neck with resolving moist desquamation and persistent right bulky mass  CBC    Component Value Date/Time   WBC 4.3 12/27/2016 1137   RBC 4.61 12/27/2016 1137   HGB 13.3 12/27/2016 1137   HGB 14.5 12/21/2016 1422   HCT 39.8 12/27/2016 1137   HCT 43.7 12/21/2016 1422   PLT 227 12/27/2016 1137   PLT 198 12/21/2016 1422   PLT 175 09/16/2016 1444   MCV 86.3 12/27/2016 1137   MCV 87.9 12/21/2016 1422   MCH 28.9 12/27/2016 1137   MCHC 33.4 12/27/2016 1137   RDW 13.8 12/27/2016 1137   RDW 14.6 12/21/2016 1422   LYMPHSABS 0.5 (L) 12/27/2016 1137   LYMPHSABS 0.2 (L) 12/21/2016 1422   MONOABS 0.3 12/27/2016 1137   MONOABS 0.6 12/21/2016 1422   EOSABS 0.0 12/27/2016 1137   EOSABS 0.1 12/21/2016 1422   BASOSABS 0.0 12/27/2016 1137   BASOSABS 0.0 12/21/2016 1422     CMP     Component Value Date/Time   NA 138 01/17/2017 1125   K 4.4 01/17/2017 1125   CL 99 (L) 12/27/2016 1137   CO2 29 01/17/2017 1125   GLUCOSE 112 01/17/2017 1125   BUN 17.2 01/17/2017 1125   CREATININE 0.7 01/17/2017 1125   CALCIUM 9.8 01/17/2017  1125   PROT 7.9 12/21/2016 1423   ALBUMIN 3.4 (L) 12/21/2016 1423   AST 19 12/21/2016 1423   ALT 23 12/21/2016 1423   ALKPHOS 119 12/21/2016 1423   BILITOT 0.80 12/21/2016 1423   GFRNONAA >60 12/27/2016 1137   GFRAA >60 12/27/2016 1137     Impression:  The patient is tolerating radiotherapy.   Plan:  Continue radiotherapy as planned. Continue silvadene on skin with biafine as previously used. F/u in 1/2 mo.  -----------------------------------  Eppie Gibson, MD

## 2017-01-24 NOTE — Progress Notes (Signed)
Collin [name] presents for his last fraction to his Head and neck and base of tongue. He reports pain to his neck. He has redness and blisters present. He is using biafine and silvadene to this area with some relief. He has some fatigue. He is eating some soft foods orally like scrambled eggs. He is instilling about 6 cans of nutritional supplement daily. He is drinking water orally and instilling some in his feeding tube. He is having daily bowel movements, and has decreased his use of miralax. His feeding tube still has redness present, but has improved. He does have a cough due to thick saliva.   BP 110/71   Pulse 91   Temp 97.8 F (36.6 C)   Ht 6' (1.829 m)   Wt 244 lb 12.8 oz (111 kg)   SpO2 96% Comment: room air  BMI 33.20 kg/m    Weight:  01/18/17 245.4 lb 01/24/17 244.8 lb

## 2017-01-24 NOTE — Progress Notes (Signed)
Oncology Nurse Navigator Documentation  Met with Collin [name] during final RT to offer support and to celebrate end of radiation treatment.  He was accompanied by his wife. I provided wife with a Certificate of Recognition for her supportive care. I provided post-RT guidance:  Importance of keeping follow-up appts with Nutrition and SLP.  Importance of protecting treatment area from sun.  Continuation of Sonafine application 2-3 times daily. I explained that my role as navigator will continue for several more months and that I will be calling and/or joining him during follow-up visits.   I encouraged him to call me with needs/concerns.   Patient and wife verbalized understanding of information provided.  Gayleen Orem, RN, BSN, [facility] [place] [phone]

## 2017-01-24 NOTE — Progress Notes (Signed)
Nutrition follow-up completed with patient after radiation therapy for tongue cancer. Patient's final radiation therapy is today Monday, May 21. Weight documented as 244.8 pounds on May 21, down from 247.2 pounds the 15th. Patient reports he is tolerating approximately 6 bottles Osmolite 1.5with 60 mL free water before and after each bolus feeding. He continues to drink Ensure Plus by mouth and is eating some soft foods. He denies nausea, vomiting, diarrhea, constipation.  Nutrition diagnosis: Inadequate oral intake continues.  Intervention: Educated patient to continue Osmolite 1.5 1-1/2 bottles 4 times a day with 60 mL water before and after bolus feeding. Educated patient to continue soft foods by mouth and Ensure Plus by mouth as tolerated. Encouraged increased hydration, to help with dry mouth and thick saliva. Teach back method used.  Monitoring, evaluation, goals: Patient will increase oral intake and decrease tube feeding while maintaining weight and supporting healing.  No follow-up is scheduled at this time.  Patient has my contact information for questions or concerns.  **Disclaimer: This note was dictated with voice recognition software. Similar sounding words can inadvertently be transcribed and this note may contain transcription errors which may not have been corrected upon publication of note.**

## 2017-01-25 ENCOUNTER — Encounter: Payer: Self-pay | Admitting: Radiation Oncology

## 2017-01-25 ENCOUNTER — Ambulatory Visit: Payer: Medicare HMO

## 2017-01-25 NOTE — Progress Notes (Signed)
Radiation Oncology         [phone] ________________________________  Name: Collin [name] MRN: [id number]  Date: 01/25/2017  DOB: 10-04-1940  End of Treatment Note  Diagnosis:   Malignant neoplasm of base of tongue ([facility]CC) Clinical Stage II (cT2, cN2, cM0, p16: Positive)   Indication for treatment:  Curative       Radiation treatment dates:  12/07/16 - 01/24/17  Site/dose:   Base of Tongue treated to total dose of 70 Gy in 35 fractions  Beams/energy:   IMRT  //  6X  Narrative: The patient tolerated radiation treatment relatively well. He reported pain to the skin over his neck towards the end of treatment. The patient also experienced several episodes of nausea and vomiting, which he attributed to swallowing thick saliva. He continued to use a PEG tube throughout treatment, and reportedly instilled 4 cans of Osmolite and 3 cans of Ensure daily. He reported he could drink water orally.  Plan: The patient has completed radiation treatment. The patient was prescribed Scopolamine for thick salivary secretions. He will use Silvadene as instructed on areas of skin breakdown. The patient will return to radiation oncology clinic for routine followup in one half month. I advised them to call or return sooner if they have any questions or concerns related to their recovery or treatment.  -----------------------------------  Lonie Peak, MD  This document serves as a record of services personally performed by Lonie Peak, MD. It was created on her behalf by Lavenia Atlas, a trained medical scribe. The creation of this record is based on the scribe's personal observations and the provider's statements to them. This document has been checked and approved by the attending provider.

## 2017-01-26 ENCOUNTER — Ambulatory Visit: Payer: Medicare HMO

## 2017-01-26 ENCOUNTER — Ambulatory Visit (INDEPENDENT_AMBULATORY_CARE_PROVIDER_SITE_OTHER): Payer: Medicare HMO | Admitting: General Practice

## 2017-01-26 DIAGNOSIS — Z5181 Encounter for therapeutic drug level monitoring: Secondary | ICD-10-CM | POA: Diagnosis not present

## 2017-01-26 DIAGNOSIS — I4891 Unspecified atrial fibrillation: Secondary | ICD-10-CM | POA: Diagnosis not present

## 2017-01-26 LAB — POCT INR: INR: 1.3

## 2017-01-26 NOTE — Patient Instructions (Signed)
Pre visit review using our clinic review tool, if applicable. No additional management support is needed unless otherwise documented below in the visit note. 

## 2017-01-27 DIAGNOSIS — C140 Malignant neoplasm of pharynx, unspecified: Secondary | ICD-10-CM | POA: Diagnosis not present

## 2017-01-27 DIAGNOSIS — I428 Other cardiomyopathies: Secondary | ICD-10-CM | POA: Diagnosis not present

## 2017-01-30 ENCOUNTER — Other Ambulatory Visit: Payer: Self-pay | Admitting: Family Medicine

## 2017-02-07 ENCOUNTER — Ambulatory Visit: Payer: Medicare HMO | Attending: Radiation Oncology

## 2017-02-07 DIAGNOSIS — R1310 Dysphagia, unspecified: Secondary | ICD-10-CM | POA: Insufficient documentation

## 2017-02-07 NOTE — Therapy (Signed)
[facility] [address] [place], [place], [address] Phone: [phone]   Fax:  [phone]  Speech Language Pathology Treatment  Patient Details  Name: Collin [name] MRN: [id number] Date of Birth: 01-24-1941 Referring Provider: Eppie Gibson MD  Encounter Date: 02/07/2017      End of Session - 02/07/17 0925    Visit Number 3   Number of Visits 5   Date for SLP Re-Evaluation 05/06/17   Authorization Type --   SLP Start Time 0809   SLP Stop Time  0851   SLP Time Calculation (min) 42 min   Activity Tolerance Patient tolerated treatment well      Past Medical History:  Diagnosis Date  . Atrial fibrillation ([place])   . 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 Diabetes mellitus   . Heart disease   . Hyperlipidemia   . OSA (obstructive sleep apnea)   . Shingles   . 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 It sometimes shows as a shadow on scans.     Past Surgical History:  Procedure Laterality Date  . CARDIAC DEFIBRILLATOR PLACEMENT  2006  . CATARACT EXTRACTION Bilateral    01/07/2016. 01/22/2016  . CYST EXCISION     multiple drainage to a cyst in his neck.   Marland Kitchen DIRECT LARYNGOSCOPY N/A 11/16/2016   Procedure: DIRECT LARYNGOSCOPY AND BIOPSY;  Surgeon: Rozetta Nunnery, MD;  Location: [facility];  Service: ENT;  Laterality: N/A;  . HERNIA REPAIR     1950's  . IMPLANTABLE CARDIOVERTER DEFIBRILLATOR (ICD) GENERATOR CHANGE N/A 05/10/2014   Procedure: ICD GENERATOR CHANGE;  Surgeon: [name], MD;  Location: [facility] CATH LAB;  Service: Cardiovascular;  Laterality: N/A;  . IR GASTROSTOMY TUBE MOD SED  12/27/2016  . IR PATIENT EVAL TECH 0-60 MINS  01/17/2017  . MASS EXCISION Right 11/16/2016   Procedure: EXCISION RIGHT NECK LIPOMA;  Surgeon: Rozetta Nunnery, MD;  Location: [facility];  Service: ENT;  Laterality: Right;  . MOHS SURGERY  01/01/2015   to top of head  .  MOUTH SURGERY     teeth removed 4 teeth extracted 02/2010    There were no vitals filed for this visit.      Subjective Assessment - 02/07/17 0815    Subjective Pt reports significant ropy saliva sometimes causing vomiting. SLP appreciates neck mass on rt.    Patient is accompained by: --  wife               ADULT SLP TREATMENT - 02/07/17 0818      General Information   Behavior/Cognition Alert;Cooperative;Pleasant mood     Treatment Provided   Treatment provided Dysphagia     Dysphagia Treatment   Temperature Spikes Noted No   Respiratory Status Room air   Treatment Methods Skilled observation   Patient observed directly with PO's Yes   Type of PO's observed Thin liquids   Pharyngeal Phase Signs & Symptoms Delayed cough  pt/wife report this happens without liquids-thru the day   Other treatment/comments Pt reports swallowing hindered by thick saliva. Is completely tube dependent but has approx one cup water daily. Is not trying any saliva/mucous-thinning rinses. Pt and wife report coughing with thick mucous produced throughout the day. Denies chest pain, fevers, dypsnea without exertion. Some dypsnea with exertion. Pt procedure with HEP was WNL, however pt req'd consistent min verbal cues on how to do  [facility] [address] [place], [place], [address] Phone: [phone]   Fax:  [phone]  Speech Language Pathology Treatment  Patient Details  Name: Collin [name] MRN: [id number] Date of Birth: 01-24-1941 Referring Provider: Eppie Gibson MD  Encounter Date: 02/07/2017      End of Session - 02/07/17 0925    Visit Number 3   Number of Visits 5   Date for SLP Re-Evaluation 05/06/17   Authorization Type --   SLP Start Time 0809   SLP Stop Time  0851   SLP Time Calculation (min) 42 min   Activity Tolerance Patient tolerated treatment well      Past Medical History:  Diagnosis Date  . Atrial fibrillation ([place])   . 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 Diabetes mellitus   . Heart disease   . Hyperlipidemia   . OSA (obstructive sleep apnea)   . Shingles   . 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 It sometimes shows as a shadow on scans.     Past Surgical History:  Procedure Laterality Date  . CARDIAC DEFIBRILLATOR PLACEMENT  2006  . CATARACT EXTRACTION Bilateral    01/07/2016. 01/22/2016  . CYST EXCISION     multiple drainage to a cyst in his neck.   Marland Kitchen DIRECT LARYNGOSCOPY N/A 11/16/2016   Procedure: DIRECT LARYNGOSCOPY AND BIOPSY;  Surgeon: Rozetta Nunnery, MD;  Location: [facility];  Service: ENT;  Laterality: N/A;  . HERNIA REPAIR     1950's  . IMPLANTABLE CARDIOVERTER DEFIBRILLATOR (ICD) GENERATOR CHANGE N/A 05/10/2014   Procedure: ICD GENERATOR CHANGE;  Surgeon: [name], MD;  Location: [facility] CATH LAB;  Service: Cardiovascular;  Laterality: N/A;  . IR GASTROSTOMY TUBE MOD SED  12/27/2016  . IR PATIENT EVAL TECH 0-60 MINS  01/17/2017  . MASS EXCISION Right 11/16/2016   Procedure: EXCISION RIGHT NECK LIPOMA;  Surgeon: Rozetta Nunnery, MD;  Location: [facility];  Service: ENT;  Laterality: Right;  . MOHS SURGERY  01/01/2015   to top of head  .  MOUTH SURGERY     teeth removed 4 teeth extracted 02/2010    There were no vitals filed for this visit.      Subjective Assessment - 02/07/17 0815    Subjective Pt reports significant ropy saliva sometimes causing vomiting. SLP appreciates neck mass on rt.    Patient is accompained by: --  wife               ADULT SLP TREATMENT - 02/07/17 0818      General Information   Behavior/Cognition Alert;Cooperative;Pleasant mood     Treatment Provided   Treatment provided Dysphagia     Dysphagia Treatment   Temperature Spikes Noted No   Respiratory Status Room air   Treatment Methods Skilled observation   Patient observed directly with PO's Yes   Type of PO's observed Thin liquids   Pharyngeal Phase Signs & Symptoms Delayed cough  pt/wife report this happens without liquids-thru the day   Other treatment/comments Pt reports swallowing hindered by thick saliva. Is completely tube dependent but has approx one cup water daily. Is not trying any saliva/mucous-thinning rinses. Pt and wife report coughing with thick mucous produced throughout the day. Denies chest pain, fevers, dypsnea without exertion. Some dypsnea with exertion. Pt procedure with HEP was WNL, however pt req'd consistent min verbal cues on how to do  Pressure injury of skin 11/14/2016  . Tongue mass: Per Ct neck 11/10/2016 11/12/2016  . SBO (small bowel obstruction) ([place]) 11/11/2016  . Encounter for therapeutic drug monitoring 10/25/2013  . Diabetic peripheral neuropathy ([place]) 10/14/2011  . Hypotension 08/20/2011  . Acute renal failure ([place]) 08/20/2011  . OSA (obstructive sleep apnea) 04/15/2011  . ICD-[employer] 03/30/2011  . Chronic systolic CHF (congestive heart failure) ([place]) 03/30/2011  . Atrial fibrillation with RVR ([place]) 12/14/2010  . Nonischemic cardiomyopathy ([place]) 12/14/2010  . Type 2 diabetes mellitus ([place]) 12/14/2010  . Hyperlipidemia 12/14/2010    [facility] ,[place], [place]  02/07/2017, 9:53 AM  [facility] [address] [place], [place], [address] Phone: [phone]   Fax:  [phone]   Name: Collin [name] MRN: [id number] Date of Birth: 1941-09-05

## 2017-02-08 ENCOUNTER — Encounter: Payer: Self-pay | Admitting: Radiation Oncology

## 2017-02-08 ENCOUNTER — Telehealth: Payer: Self-pay | Admitting: *Deleted

## 2017-02-08 NOTE — Telephone Encounter (Signed)
CALLED PATIENT TO INFORM OF FU APPT. WITH DR. Isidore Moos ON 02-11-17 @ 10:20 AM, LVM FOR A RETURN CALL

## 2017-02-09 ENCOUNTER — Ambulatory Visit (INDEPENDENT_AMBULATORY_CARE_PROVIDER_SITE_OTHER): Payer: Medicare HMO | Admitting: General Practice

## 2017-02-09 DIAGNOSIS — H40013 Open angle with borderline findings, low risk, bilateral: Secondary | ICD-10-CM | POA: Diagnosis not present

## 2017-02-09 DIAGNOSIS — H26493 Other secondary cataract, bilateral: Secondary | ICD-10-CM | POA: Diagnosis not present

## 2017-02-09 DIAGNOSIS — Z5181 Encounter for therapeutic drug level monitoring: Secondary | ICD-10-CM | POA: Diagnosis not present

## 2017-02-09 DIAGNOSIS — E119 Type 2 diabetes mellitus without complications: Secondary | ICD-10-CM | POA: Diagnosis not present

## 2017-02-09 DIAGNOSIS — H00021 Hordeolum internum right upper eyelid: Secondary | ICD-10-CM | POA: Diagnosis not present

## 2017-02-09 DIAGNOSIS — I4891 Unspecified atrial fibrillation: Secondary | ICD-10-CM

## 2017-02-09 LAB — HM DIABETES EYE EXAM

## 2017-02-09 LAB — POCT INR: INR: 2.6

## 2017-02-09 NOTE — Patient Instructions (Signed)
Pre visit review using our clinic review tool, if applicable. No additional management support is needed unless otherwise documented below in the visit note. 

## 2017-02-10 ENCOUNTER — Encounter: Payer: Self-pay | Admitting: Family Medicine

## 2017-02-11 ENCOUNTER — Other Ambulatory Visit: Payer: Self-pay | Admitting: Radiation Oncology

## 2017-02-11 ENCOUNTER — Ambulatory Visit
Admission: RE | Admit: 2017-02-11 | Discharge: 2017-02-11 | Disposition: A | Discharge status: Home / Self Care | Payer: Medicare HMO | Source: Ambulatory Visit | Attending: Radiation Oncology | Admitting: Radiation Oncology

## 2017-02-11 ENCOUNTER — Encounter: Payer: Self-pay | Admitting: *Deleted

## 2017-02-11 ENCOUNTER — Encounter: Payer: Self-pay | Admitting: Radiation Oncology

## 2017-02-11 VITALS — BP 105/61 | HR 78 | Temp 98.4°F | Ht 72.0 in | Wt 245.6 lb

## 2017-02-11 DIAGNOSIS — K429 Umbilical hernia without obstruction or gangrene: Secondary | ICD-10-CM

## 2017-02-11 DIAGNOSIS — Z79899 Other long term (current) drug therapy: Secondary | ICD-10-CM | POA: Diagnosis not present

## 2017-02-11 DIAGNOSIS — R600 Localized edema: Secondary | ICD-10-CM | POA: Diagnosis not present

## 2017-02-11 DIAGNOSIS — Z7901 Long term (current) use of anticoagulants: Secondary | ICD-10-CM | POA: Diagnosis not present

## 2017-02-11 DIAGNOSIS — Z1329 Encounter for screening for other suspected endocrine disorder: Secondary | ICD-10-CM

## 2017-02-11 DIAGNOSIS — C01 Malignant neoplasm of base of tongue: Secondary | ICD-10-CM | POA: Insufficient documentation

## 2017-02-11 DIAGNOSIS — Z931 Gastrostomy status: Secondary | ICD-10-CM | POA: Diagnosis not present

## 2017-02-11 DIAGNOSIS — Z7984 Long term (current) use of oral hypoglycemic drugs: Secondary | ICD-10-CM | POA: Diagnosis not present

## 2017-02-11 DIAGNOSIS — R05 Cough: Secondary | ICD-10-CM | POA: Insufficient documentation

## 2017-02-11 HISTORY — DX: Personal history of irradiation: Z92.3

## 2017-02-11 NOTE — Progress Notes (Signed)
Radiation Oncology         [phone] ________________________________  Name: Collin [name] MRN: [id number]  Date: 02/11/2017  DOB: 1941/06/15  Follow-Up Visit Note  CC: Collin Covey, MD  Collin [name], *  Diagnosis and Prior Radiotherapy:       ICD-10-CM   1. 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  The patient takes an oxycodone at night and denies pain at this time. His weight is stable in the 240s and he is instilling 3-4 cans of Osmolite, 3-4 cans of Ensure, and 3 cups of water daily through his feeding tube. He is unable to swallow food and takes sips of water orally. He is using Prevident toothpaste and he will contact Dr. Kristin [name] for fluoride tray refills. He has not visited the ENT since diagnosis. The patient continues to have thick saliva, productive coughing fits that has improved since completing radiation, and he continues to apply biafine and silvadene to this neck.  The patient's wife and Collin Berry, RN, our Head and Neck Oncology Navigator were present during this encounter.  ALLERGIES:  is allergic to niaspan [niacin er].  Meds: Current Outpatient Prescriptions  Medication Sig Dispense Refill  . carvedilol (COREG) 25 MG tablet TAKE 1 TABLET TWICE DAILY WITH MEALS 180 tablet 1  . digoxin (LANOXIN) 0.125 MG tablet Take 1 tablet (0.125 mg total) by mouth daily. 90 tablet 3  . diltiazem (CARDIZEM) 90 MG tablet Take 1 tablet (90 mg total) by mouth daily. 30 tablet 3  . emollient (BIAFINE) cream Apply topically as needed. 454 g 0  . furosemide (LASIX) 20 MG tablet Take 1 tablet (20 mg total) by mouth daily. 30 tablet 3  . Nutritional  Supplements (FEEDING SUPPLEMENT, OSMOLITE 1.5 CAL,) LIQD Give 2 cans of formula at 8am and 12 noon. Give additional 1 1/2 cans at 4pm and 8pm feeding.   Flush with 60ml of water before and after feeding 4 times per day 1659 mL 0  . Nutritional Supplements (PROMOD) LIQD Give 30ml (1 oz) 2 times per day via tube. Flush with 60ml of water before and after given. 946 mL   . oxyCODONE (OXY IR/ROXICODONE) 5 MG immediate release tablet Take 1 tablet (5 mg total) by mouth every 4 (four) hours as needed for severe pain. 60 tablet 0  . polyethylene glycol (MIRALAX) packet Take 17 g by mouth daily as needed. Available OTC 30 each 0  . silver sulfADIAZINE (SILVADENE) 1 % cream Apply 1 application topically 2 (two) times daily. Apply a thin layer to affected skin twice per day but, not four hours prior to radiation therapy.    . sodium fluoride (PREVIDENT 5000 PLUS) 1.1 % CREA dental cream Apply gel to tooth brush. Brush teeth for 2 minutes. Spit out excess-DO NOT swallow. Repeat nightly. 1 Tube prn  . warfarin (COUMADIN) 5 MG tablet TAKE AS DIRECTED BY ANTICOAGULATION CLINIC. 105 tablet 0  . acetaminophen (TYLENOL) 500 MG tablet Take 1,000 mg by mouth every 6 (six) hours as needed for moderate pain.    Marland Kitchen lidocaine (XYLOCAINE) 2 % solution Patient: Mix 1part 2% viscous lidocaine, 1part H20.  Swish and swallow 10mL of this mixture, before meals and at bedtime, up to QID (Patient not taking: Reported on 12/28/2016) 100 mL 5  . metFORMIN (GLUCOPHAGE) 500 MG tablet TAKE 1 TABLET TWICE DAILY WITH MEALS (Patient not taking: Reported on 12/28/2016) 180 tablet 0  . ondansetron (ZOFRAN ODT) 4 MG disintegrating tablet Take 1 tablet (4 mg total) by mouth every 8 (eight) hours as needed for nausea or vomiting. (Patient not taking: Reported on 12/28/2016) 20 tablet 0  . pravastatin (PRAVACHOL) 40 MG tablet TAKE 1 TABLET EVERY DAY (Patient not taking: Reported on 02/11/2017) 90 tablet 2   No current facility-administered  medications for this encounter.     Physical Findings: The patient is in no acute distress. Patient is alert and oriented. Wt Readings from Last 3 Encounters:  02/11/17 245 lb 9.6 oz (111.4 kg)  01/24/17 244 lb 12.8 oz (111 kg)  01/18/17 247 lb 3.2 oz (112.1 kg)    height is 6' (1.829 m) and weight is 245 lb 9.6 oz (111.4 kg). His temperature is 98.4 F (36.9 C). His blood pressure is 105/61 and his pulse is 78. His oxygen saturation is 97%. .  General: Alert and oriented, in no acute distress HEENT: Head is normocephalic. Extraocular movements are intact. Oropharynx is notable for moist mucous membranes, erythema in his throat, no mucositis Neck: Persistent level II right neck mass which is about 5 cm in greatest dimension and he has small pea sized cystlike mass inferior to that and superficial within the subcutaneous tissue Skin: Skin in treatment fields shows satisfactory healing with some erythema. Heart: Regular in rate and rhythm with no murmurs, rubs, or gallops. Chest: Clear to auscultation bilaterally, with no rhonchi, wheezes, or rales. Extremities: No cyanosis. Pitting significant edema in his bilateral lower extremities with more on the left. He denies calf tenderness or pain. Lymphatics: see Neck Exam Psychiatric: Judgment and insight are intact. Affect is appropriate.   Lab Findings: Lab Results  Component Value Date   WBC 4.3 12/27/2016   HGB 13.3 12/27/2016   HCT 39.8 12/27/2016   MCV 86.3 12/27/2016   PLT 227 12/27/2016    Lab Results  Component Value Date   TSH 0.147 (L) 12/21/2016    Radiographic Findings: Ir Patient Eval Tech 0-60 Mins  Result Date: 01/17/2017 Daryel November     01/17/2017  9:53 AM Patient and his family came in today with c/o swollen , red and painful g tube insertion site.  I evaluated the catheter and noticed that the bumper was very tight.  I was able to loosen the bumper and the patient got immediate relief.  The site and surrounding  area were red.  Brayton El PA evaluated the site and did not feel that the site looked infected.  The patient and family was advised to keep the bumper at the 4 cm mark on the tube and a single split gauze under the bumper to protect the skin from further irritation. They will contact us with any further problems with the tube.   Impression/Plan:    1) Head and Neck Cancer Status: The patient continues to have a persistent level II right neck mass which is about 5 cm in greatest dimension.  It is not growing, but not shrinking very much. Reassess w/ PET at 3 mo post RT. It could be necrotic tissue.  Pt knows to call if it grows before then.  2) Nutritional Status: Weight stable in the 240s. PEG  tube: Yes - prn.  3) Swallowing: Unable to swallow food, but he does sip water  4) Dental: Encouraged to continue regular followup with dentistry, and dental hygiene including fluoride rinses.  5) Thyroid function: recheck at next f/u Lab Results  Component Value Date   TSH 0.147 (L) 12/21/2016    6) Other: The patient has bilateral lower extremity edema. The patient is on Lasix. I advised the patient to speak with his PCP concerning his Lasix and to keep his legs elevated. I will refer the patient back to the general surgery, Dr. Ezzard Standing, for his abdominal hernia. The patient deferred physical therapy at this time and I advised him to walk as a form of exercise.  7)) The patient is scheduled to follow up ENT in 1 month. Follow-up with me in mid-late August with a PET scan prior to this follow up. The patient was encouraged to call with any issues or questions before then.    _____________________________________   Lonie Peak, MD  This document serves as a record of services personally performed by Lonie Peak, MD. It was created on her behalf by Eustace Moore, a trained medical scribe. The creation of this record is based on the scribe's personal observations and the provider's statements to  them. This document has been checked and approved by the attending provider.

## 2017-02-11 NOTE — Progress Notes (Signed)
Oncology Nurse Navigator Documentation  Met with patient and his wife during 2-week post-treatment follow-up. He reported:  Oral intake of water only.  Instilling 6-8 cans of Ensure/Osmolite daily.  Has lost 45 lbs since starting treatment but weight steady now at 245 lbs. Skin intact, well healed.  Encouraged to continue Biafine application BID for 2 weeks, then lotion containing Vit E. Has appt with ENT Lucia Gaskins in late July. They understand to contact me with needs/concerns.  Gayleen Orem, RN, BSN, [facility] Oncology Nurse [place] [facility] [phone]

## 2017-02-11 NOTE — Progress Notes (Signed)
  Collin [name] is here for follow up of radiation completed 01/24/17 to his Base of Tongue.   Pain issues, if any: He denies. He does take an oxycodone at night.  Using a feeding tube?: Yes. He is instilling 3-4 cans daily of osmolite and 3-4 cans of ensure daily through his feeding tube. He also is instilling about 3 cups of water daily.  Weight changes, if any:  Wt Readings from Last 3 Encounters:  02/11/17 245 lb 9.6 oz (111.4 kg)  01/24/17 244 lb 12.8 oz (111 kg)  01/18/17 247 lb 3.2 oz (112.1 kg)   Swallowing issues, if any: He is unable to swallow food. He is taking sips of water orally.  Smoking or chewing tobacco? No Using fluoride trays daily? He is using prevident toothpaste. He will contact Dr. Enrique Sack for a refill.  Last ENT visit was on: Not since diagnosis.  Other notable issues, if any:  He continues with thick saliva. He has coughing fits and will vomit at times due to thick saliva. He does tell me that it has improved since completing radiation.  His skin has improved. He is using biafine and silvadene still. I do not observe any open areas at this time. I have encouraged him to continue using the biafine and switch to a vitamin E cream when the tube is completed.   BP 105/61   Pulse 78   Temp 98.4 F (36.9 C)   Ht 6' (1.829 m)   Wt 245 lb 9.6 oz (111.4 kg)   SpO2 97% Comment: room air  BMI 33.31 kg/m    Wt Readings from Last 3 Encounters:  02/11/17 245 lb 9.6 oz (111.4 kg)  01/24/17 244 lb 12.8 oz (111 kg)  01/18/17 247 lb 3.2 oz (112.1 kg)

## 2017-02-16 ENCOUNTER — Telehealth: Payer: Self-pay | Admitting: *Deleted

## 2017-02-16 NOTE — Telephone Encounter (Signed)
Called patient to inform of appt. With  Dr. Alphonsa Overall on 03/22/17 - arrival time - 3:45 pm, address - [address], ph. No. [phone], pt. to bring insurance card, photo id and co-pay, lvm for a return call

## 2017-02-21 DIAGNOSIS — C140 Malignant neoplasm of pharynx, unspecified: Secondary | ICD-10-CM | POA: Diagnosis not present

## 2017-02-21 DIAGNOSIS — I428 Other cardiomyopathies: Secondary | ICD-10-CM | POA: Diagnosis not present

## 2017-02-24 ENCOUNTER — Telehealth (HOSPITAL_COMMUNITY): Payer: Self-pay | Admitting: Dentistry

## 2017-02-24 NOTE — Telephone Encounter (Signed)
02/24/17  Called and left msg. on pt's home & wife's mobile # to call Dental Medicine and schl. F/U appt. w/Dr. Enrique Sack.  LRI

## 2017-02-25 ENCOUNTER — Telehealth (HOSPITAL_COMMUNITY): Payer: Self-pay | Admitting: Dentistry

## 2017-02-25 NOTE — Telephone Encounter (Signed)
02/25/17 Returned call to pt. Collin [name] w/pt's wife Clarene Critchley regarding F/U appt. w/Dr. Enrique Sack.  Clarene Critchley stated pt. not feeling well and does not want to schedule appt. at this time. Pt. will call back at a later date to schedule.  LRI

## 2017-02-27 DIAGNOSIS — I428 Other cardiomyopathies: Secondary | ICD-10-CM | POA: Diagnosis not present

## 2017-02-27 DIAGNOSIS — C140 Malignant neoplasm of pharynx, unspecified: Secondary | ICD-10-CM | POA: Diagnosis not present

## 2017-03-02 ENCOUNTER — Ambulatory Visit (INDEPENDENT_AMBULATORY_CARE_PROVIDER_SITE_OTHER): Payer: Medicare HMO | Admitting: General Practice

## 2017-03-02 DIAGNOSIS — Z5181 Encounter for therapeutic drug level monitoring: Secondary | ICD-10-CM | POA: Diagnosis not present

## 2017-03-02 DIAGNOSIS — I4891 Unspecified atrial fibrillation: Secondary | ICD-10-CM

## 2017-03-02 LAB — POCT INR: INR: 2.8

## 2017-03-02 NOTE — Patient Instructions (Signed)
Pre visit review using our clinic review tool, if applicable. No additional management support is needed unless otherwise documented below in the visit note. 

## 2017-03-10 ENCOUNTER — Telehealth: Payer: Self-pay | Admitting: Internal Medicine

## 2017-03-10 DIAGNOSIS — R0602 Shortness of breath: Secondary | ICD-10-CM

## 2017-03-10 DIAGNOSIS — R600 Localized edema: Secondary | ICD-10-CM

## 2017-03-10 NOTE — Telephone Encounter (Signed)
Pt c/o swelling: STAT is pt has developed SOB within 24 hours  1. How long have you been experiencing swelling? Several days   2. Where is the swelling located? Feet   3.  Are you currently taking a "fluid pill"? yes 4.  Are you currently SOB?  no  5.  Have you traveled recently? no

## 2017-03-10 NOTE — Telephone Encounter (Signed)
Spoke with Clarene Critchley (DPR on file) and she states that the patient's feet have been swollen the past several days. She states that the patient denies having any SOB, weight gain, or any other symptoms. Patient takes furosemide 20 mg QD. Patient had a feeding tube placed in April and his meds are being crushed and given via tube. Patient is not able to swallow very well and has had minimal intake. Clarene Critchley states that he has had some chicken broth. Advised for patient to elevate feet and limit his salt intake. Information forwarded to [name] for review and recommendation.

## 2017-03-11 ENCOUNTER — Other Ambulatory Visit: Payer: Medicare HMO | Admitting: *Deleted

## 2017-03-11 DIAGNOSIS — R600 Localized edema: Secondary | ICD-10-CM

## 2017-03-11 DIAGNOSIS — R0602 Shortness of breath: Secondary | ICD-10-CM | POA: Diagnosis not present

## 2017-03-11 NOTE — Telephone Encounter (Signed)
Left message for pt dtr to call  

## 2017-03-11 NOTE — Addendum Note (Signed)
Addended by: Marciano Sequin on: 03/11/2017 03:37 PM   Modules accepted: Orders

## 2017-03-11 NOTE — Telephone Encounter (Signed)
Spoke with pt wife, aware of dr Harrington Challenger recommendations. They will come by the office today for the lab work. Follow up scheduled with dr Harrington Challenger next week.

## 2017-03-11 NOTE — Telephone Encounter (Signed)
Would recomm CMET, TSH and BNP Pt can try taking 40 lasix once  20 next day the 40 mg once  Resume 20 Limit salt intake   Should have f/u in clinic  Weigh daily

## 2017-03-11 NOTE — Addendum Note (Signed)
Addended by: Eulis Foster on: 03/11/2017 03:42 PM   Modules accepted: Orders

## 2017-03-12 LAB — COMPREHENSIVE METABOLIC PANEL
ALT: 21 IU/L (ref 0–44)
AST: 20 IU/L (ref 0–40)
Albumin/Globulin Ratio: 1.3 (ref 1.2–2.2)
Albumin: 3.8 g/dL (ref 3.5–4.8)
Alkaline Phosphatase: 146 IU/L — ABNORMAL HIGH (ref 39–117)
BUN/Creatinine Ratio: 29 — ABNORMAL HIGH (ref 10–24)
BUN: 17 mg/dL (ref 8–27)
Bilirubin Total: 0.4 mg/dL (ref 0.0–1.2)
CO2: 23 mmol/L (ref 20–29)
Calcium: 9.3 mg/dL (ref 8.6–10.2)
Chloride: 98 mmol/L (ref 96–106)
Creatinine, Ser: 0.58 mg/dL — ABNORMAL LOW (ref 0.76–1.27)
GFR calc Af Amer: 114 mL/min/{1.73_m2} (ref >59)
GFR calc non Af Amer: 99 mL/min/{1.73_m2} (ref >59)
Globulin, Total: 3 g/dL (ref 1.5–4.5)
Glucose: 114 mg/dL — ABNORMAL HIGH (ref 65–99)
Potassium: 4.1 mmol/L (ref 3.5–5.2)
Sodium: 140 mmol/L (ref 134–144)
Total Protein: 6.8 g/dL (ref 6.0–8.5)

## 2017-03-12 LAB — TSH: TSH: 1.36 u[IU]/mL (ref 0.450–4.500)

## 2017-03-12 LAB — PRO B NATRIURETIC PEPTIDE: NT-Pro BNP: 777 pg/mL — ABNORMAL HIGH (ref 0–486)

## 2017-03-12 NOTE — Progress Notes (Signed)
Cardiology Office Note   Date:  03/14/2017   ID:  Collin [name], DOB 02-10-1941, MRN [id number]  PCP:  [name], MD  Cardiologist:   Dietrich Pates, MD    F/U of CHF   History of Present Illness: Collin [name] is a 76 y.o. male with a history of DM, atrial fib, HL, OSA, NICM  Pt with ICD   I saw in Jan 2016  I also saw him in the hospital in Jan 2018    The pt's wife called last week  Said her husband had more LE swelling   I recomm increasing lasix every other day to 40 and t osched f/u  Th pt says that his swelling may be a little better   Has tube feeding  Uses Osmolite  Overall has about 96 ounces of fluid per day   1650 mg Na  Current Meds  Medication Sig  . acetaminophen (TYLENOL) 500 MG tablet Take 1,000 mg by mouth every 6 (six) hours as needed for moderate pain.  . carvedilol (COREG) 25 MG tablet TAKE 1 TABLET TWICE DAILY WITH MEALS  . digoxin (LANOXIN) 0.125 MG tablet Take 1 tablet (0.125 mg total) by mouth daily.  Marland Kitchen diltiazem (CARDIZEM) 90 MG tablet Take 1 tablet (90 mg total) by mouth daily.  Marland Kitchen emollient (BIAFINE) cream Apply topically as needed.  . furosemide (LASIX) 20 MG tablet Take 1 tablet (20 mg total) by mouth daily.  . Nutritional Supplements (FEEDING SUPPLEMENT, OSMOLITE 1.5 CAL,) LIQD Give 2 cans of formula at 8am and 12 noon. Give additional 1 1/2 cans at 4pm and 8pm feeding.   Flush with 60ml of water before and after feeding 4 times per day  . Nutritional Supplements (PROMOD) LIQD Give 30ml (1 oz) 2 times per day via tube. Flush with 60ml of water before and after given.  Marland Kitchen oxyCODONE (OXY IR/ROXICODONE) 5 MG immediate release tablet Take 1 tablet (5 mg total) by mouth every 4 (four) hours as needed for severe pain.  . polyethylene glycol (MIRALAX) packet Take 17 g by mouth daily as needed. Available OTC  . silver sulfADIAZINE (SILVADENE) 1 % cream Apply 1 application topically 2 (two) times daily. Apply a thin layer to affected skin twice per  day but, not four hours prior to radiation therapy.  . sodium fluoride (PREVIDENT 5000 PLUS) 1.1 % CREA dental cream Apply gel to tooth brush. Brush teeth for 2 minutes. Spit out excess-DO NOT swallow. Repeat nightly.  . warfarin (COUMADIN) 5 MG tablet TAKE AS DIRECTED BY ANTICOAGULATION CLINIC.     Allergies:   Niaspan [niacin er]   Past Medical History:  Diagnosis Date  . Atrial fibrillation (HCC)   . Cardiomyopathy, nonischemic (HCC) 1998  . Diabetes mellitus   . Heart disease   . History of radiation therapy 12/07/16- 01/24/17   Base of Tongue 70 Gy in 35 fractions  . Hyperlipidemia   . OSA (obstructive sleep apnea)   . Shingles   . 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 It sometimes shows as a shadow on scans.     Past Surgical History:  Procedure Laterality Date  . CARDIAC DEFIBRILLATOR PLACEMENT  2006  . CATARACT EXTRACTION Bilateral    01/07/2016. 01/22/2016  . CYST EXCISION     multiple drainage to a cyst in his neck.   Marland Kitchen DIRECT LARYNGOSCOPY N/A 11/16/2016   Procedure: DIRECT LARYNGOSCOPY  AND BIOPSY;  Surgeon: [name], MD;  Location: [facility] OR;  Service: ENT;  Laterality: N/A;  . HERNIA REPAIR     1950's  . IMPLANTABLE CARDIOVERTER DEFIBRILLATOR (ICD) GENERATOR CHANGE N/A 05/10/2014   Procedure: ICD GENERATOR CHANGE;  Surgeon: [name], MD;  Location: [facility] CATH LAB;  Service: Cardiovascular;  Laterality: N/A;  . IR GASTROSTOMY TUBE MOD SED  12/27/2016  . IR PATIENT EVAL TECH 0-60 MINS  01/17/2017  . MASS EXCISION Right 11/16/2016   Procedure: EXCISION RIGHT NECK LIPOMA;  Surgeon: [name], MD;  Location: [facility] OR;  Service: ENT;  Laterality: Right;  . MOHS SURGERY  01/01/2015   to top of head  . MOUTH SURGERY     teeth removed 4 teeth extracted 02/2010     Social History:  The patient  reports that he has never smoked. He has never used smokeless tobacco. He reports that he drinks alcohol. He  reports that he does not use drugs.   Family History:  The patient's family history includes Heart disease in his mother; Hypertension in his father; Lupus in his mother; Stroke in his father; Sudden death in his mother.    ROS:  Please see the history of present illness. All other systems are reviewed and  Negative to the above problem except as noted.    PHYSICAL EXAM: VS:  BP (!) 110/58   Pulse 71   Ht 6' (1.829 m)   Wt 108.4 kg (239 lb)   SpO2 94%   BMI 32.41 kg/m   GEN: obese 76 yo  in no acute distress  HEENT: normal  Neck:  JVP is normal   Cardiac: Irreg Irreg   no murmurs, rubs, or gallops,2+ bilateral LE  edema  Respiratory:  clear to auscultation bilaterally, normal work of breathing GI: Obese  Feeding tube in place  + BS  No hepatomegaly  MS: L ankle deviates Moving all extremities   Skin: warm and dry, no rash  Chronic induration of legs   Neuro:  Strength and sensation are intact Psych: euthymic mood, full affect   EKG:  EKG is not ordered today.   Lipid Panel    Component Value Date/Time   CHOL 156 09/16/2016 1444   TRIG 324 (H) 09/16/2016 1444   HDL 42 09/16/2016 1444   CHOLHDL 3.7 09/16/2016 1444   CHOLHDL 5 11/13/2015 1525   VLDL 73.0 (H) 10/04/2014 0931   LDLCALC 49 09/16/2016 1444   LDLDIRECT 72.0 11/13/2015 1525      Wt Readings from Last 3 Encounters:  03/14/17 108.4 kg (239 lb)  02/11/17 111.4 kg (245 lb 9.6 oz)  01/24/17 111 kg (244 lb 12.8 oz)      ASSESSMENT AND PLAN:  1  Permanent atrial fib  Conitnue rate control and coumadin    2  Chronic systolic CHF  Last echo LVEF 35 to 40%  Legs are swollen  This is new  Review he drinks a little over 2 L per day  Na contenti is about 2 G   Recmm:  Cut back on fluid some LE USN r/o DVT  Low likely   May need CT of abdomen INcrease lasix to 40 daily with 10 KCL    3  HL     Current medicines are reviewed at length with the patient today.  The patient does not have concerns regarding  medicines.  Signed, Dietrich Pates, MD  03/14/2017 9:47 AM    [facility] [address] [place], [place], [place]  [address] Phone: [phone]; Fax: [phone]

## 2017-03-14 ENCOUNTER — Ambulatory Visit (INDEPENDENT_AMBULATORY_CARE_PROVIDER_SITE_OTHER): Payer: Medicare HMO | Admitting: Internal Medicine

## 2017-03-14 ENCOUNTER — Encounter: Payer: Self-pay | Admitting: Internal Medicine

## 2017-03-14 VITALS — BP 110/58 | HR 71 | Ht 72.0 in | Wt 239.0 lb

## 2017-03-14 DIAGNOSIS — M7989 Other specified soft tissue disorders: Secondary | ICD-10-CM

## 2017-03-14 DIAGNOSIS — I5022 Chronic systolic (congestive) heart failure: Secondary | ICD-10-CM

## 2017-03-14 DIAGNOSIS — I4891 Unspecified atrial fibrillation: Secondary | ICD-10-CM

## 2017-03-14 MED ORDER — POTASSIUM CHLORIDE 20 MEQ/15ML (10%) PO SOLN: 10.0000 meq | Freq: Every day | ORAL | 0 refills | Status: DC

## 2017-03-14 MED ORDER — POTASSIUM CHLORIDE ER 10 MEQ PO TBCR: 10.0000 meq | EXTENDED_RELEASE_TABLET | Freq: Every day | ORAL | 3 refills | Status: DC

## 2017-03-14 MED ORDER — FUROSEMIDE 20 MG PO TABS: 40.0000 mg | ORAL_TABLET | Freq: Every day | ORAL | 6 refills | Status: DC

## 2017-03-14 NOTE — Patient Instructions (Signed)
Your physician has recommended you make the following change in your medication:  1.) change furosemide to 40 mg (2 tablets) daily 2.) start potassium 10 meq daily (1 tablet)  Your physician recommends that you return for lab work next Monday (BMET, BNP)  Your physician has requested that you have a lower extremity venous duplex. This test is an ultrasound of the veins in the legs. It looks at venous blood flow that carries blood from the heart to the legs. Allow one hour for a Lower Venous exam.  There are no restrictions or special instructions.  Your physician recommends that you schedule a follow-up appointment in: 4-6 weeks with [name] or APP.

## 2017-03-15 ENCOUNTER — Telehealth: Payer: Self-pay | Admitting: Internal Medicine

## 2017-03-15 MED ORDER — POTASSIUM CHLORIDE ER 10 MEQ PO TBCR: EXTENDED_RELEASE_TABLET | ORAL | 3 refills | Status: DC

## 2017-03-15 NOTE — Telephone Encounter (Signed)
Changed liquid potassium to k-dur with instructions to dissolve in water and then administer via PEG.  Left message on patients voice mail and advised to call back with any questions/concerns.

## 2017-03-15 NOTE — Telephone Encounter (Signed)
New Message     Pt can not afford the liquid potassium, needs you to call in the tablets and she will crush them up. Pt is waiting at the pharmacy

## 2017-03-21 ENCOUNTER — Ambulatory Visit: Payer: Medicare HMO | Attending: Radiation Oncology

## 2017-03-21 ENCOUNTER — Other Ambulatory Visit: Payer: Medicare HMO | Admitting: *Deleted

## 2017-03-21 ENCOUNTER — Ambulatory Visit (INDEPENDENT_AMBULATORY_CARE_PROVIDER_SITE_OTHER): Payer: Medicare HMO | Admitting: *Deleted

## 2017-03-21 DIAGNOSIS — I428 Other cardiomyopathies: Secondary | ICD-10-CM

## 2017-03-21 DIAGNOSIS — M7989 Other specified soft tissue disorders: Secondary | ICD-10-CM | POA: Diagnosis not present

## 2017-03-21 DIAGNOSIS — R1310 Dysphagia, unspecified: Secondary | ICD-10-CM | POA: Insufficient documentation

## 2017-03-21 DIAGNOSIS — I5022 Chronic systolic (congestive) heart failure: Secondary | ICD-10-CM

## 2017-03-21 NOTE — Progress Notes (Signed)
Remote ICD transmission.   

## 2017-03-21 NOTE — Therapy (Signed)
[facility] [address] [place], [place], [address] Phone: [phone]   Fax:  [phone]  Speech Language Pathology Treatment  Patient Details  Name: Collin [name] MRN: [id number] Date of Birth: 1941-07-11 Referring Provider: Eppie Gibson MD  Encounter Date: 03/21/2017      End of Session - 03/21/17 4235    Visit Number 4   Number of Visits 5   Date for SLP Re-Evaluation 05/06/17   SLP Start Time 0809   SLP Stop Time  0853   SLP Time Calculation (min) 44 min   Activity Tolerance Patient tolerated treatment well      Past Medical History:  Diagnosis Date  . Atrial fibrillation ([place])   . Cardiomyopathy, nonischemic ([place]) 1998  . Diabetes mellitus   . Heart disease   . History of radiation therapy 12/07/16- 01/24/17   Base of Tongue 70 Gy in 35 fractions  . Hyperlipidemia   . OSA (obstructive sleep apnea)   . Shingles   . 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 It sometimes shows as a shadow on scans.     Past Surgical History:  Procedure Laterality Date  . CARDIAC DEFIBRILLATOR PLACEMENT  2006  . CATARACT EXTRACTION Bilateral    01/07/2016. 01/22/2016  . CYST EXCISION     multiple drainage to a cyst in his neck.   Marland Kitchen DIRECT LARYNGOSCOPY N/A 11/16/2016   Procedure: DIRECT LARYNGOSCOPY AND BIOPSY;  Surgeon: Rozetta Nunnery, MD;  Location: [facility];  Service: ENT;  Laterality: N/A;  . HERNIA REPAIR     1950's  . IMPLANTABLE CARDIOVERTER DEFIBRILLATOR (ICD) GENERATOR CHANGE N/A 05/10/2014   Procedure: ICD GENERATOR CHANGE;  Surgeon: [name], MD;  Location: [facility] CATH LAB;  Service: Cardiovascular;  Laterality: N/A;  . IR GASTROSTOMY TUBE MOD SED  12/27/2016  . IR PATIENT EVAL TECH 0-60 MINS  01/17/2017  . MASS EXCISION Right 11/16/2016   Procedure: EXCISION RIGHT NECK LIPOMA;  Surgeon: Rozetta Nunnery, MD;  Location: [facility];  Service: ENT;   Laterality: Right;  . MOHS SURGERY  01/01/2015   to top of head  . MOUTH SURGERY     teeth removed 4 teeth extracted 02/2010    There were no vitals filed for this visit.      Subjective Assessment - 03/21/17 0818    Subjective [name] doubled pt's lasix. Pt has DVT ultrasound Thursday.   Patient is accompained by: Family member  wife   Currently in Pain? No/denies               ADULT SLP TREATMENT - 03/21/17 0819      General Information   Behavior/Cognition Alert;Cooperative;Pleasant mood     Treatment Provided   Treatment provided Dysphagia     Dysphagia Treatment   Temperature Spikes Noted No   Respiratory Status Room air   Treatment Methods Skilled observation;Patient/caregiver education   Patient observed directly with PO's No   Other treatment/comments SLP appreciates neck mass on pt's rt side. Pt with complaints of continued thickened saliva/mucous hindering any regularity with PO trials. Pt has been noncompliant with HEP primarily due to thick mucous/saliva and dry mouth and politely declined performing exercises for SLP today. SLP reviewed remedies for xerostomia and for thinned mucous, and husband stated he did not know about these and wife stated, "We have it, and I remind you and remind you,  information;Cooperation/participation level      Patient will benefit from skilled therapeutic intervention in order to improve the following deficits and impairments:   Dysphagia, unspecified type    Problem List Patient Active Problem List   Diagnosis Date Noted  . Thrush, oral 12/21/2016  . Weight loss, abnormal 12/21/2016  . 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 Umbilical hernia without obstruction and without gangrene   . Pressure injury of skin 11/14/2016  . Tongue mass: Per Ct neck 11/10/2016 11/12/2016  . SBO (small bowel obstruction) ([place]) 11/11/2016  . Encounter for therapeutic drug monitoring 10/25/2013  . Diabetic peripheral neuropathy ([place]) 10/14/2011  . Hypotension 08/20/2011  . Acute renal failure ([place]) 08/20/2011  . OSA (obstructive sleep apnea) 04/15/2011  . ICD-[employer] 03/30/2011  . Chronic systolic CHF (congestive heart failure) ([place]) 03/30/2011  . Atrial fibrillation with RVR ([place]) 12/14/2010  . Nonischemic cardiomyopathy ([place]) 12/14/2010  . Type 2 diabetes mellitus ([place]) 12/14/2010  . Hyperlipidemia 12/14/2010    [place] ,[place], [place]  03/21/2017, 12:50 PM  [facility] [address] [place], [place], [address] Phone: [phone]   Fax:  [phone]   Name: Collin [name] MRN: [id number] Date of  Birth: 1941/03/06  information;Cooperation/participation level      Patient will benefit from skilled therapeutic intervention in order to improve the following deficits and impairments:   Dysphagia, unspecified type    Problem List Patient Active Problem List   Diagnosis Date Noted  . Thrush, oral 12/21/2016  . Weight loss, abnormal 12/21/2016  . 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 Umbilical hernia without obstruction and without gangrene   . Pressure injury of skin 11/14/2016  . Tongue mass: Per Ct neck 11/10/2016 11/12/2016  . SBO (small bowel obstruction) ([place]) 11/11/2016  . Encounter for therapeutic drug monitoring 10/25/2013  . Diabetic peripheral neuropathy ([place]) 10/14/2011  . Hypotension 08/20/2011  . Acute renal failure ([place]) 08/20/2011  . OSA (obstructive sleep apnea) 04/15/2011  . ICD-[employer] 03/30/2011  . Chronic systolic CHF (congestive heart failure) ([place]) 03/30/2011  . Atrial fibrillation with RVR ([place]) 12/14/2010  . Nonischemic cardiomyopathy ([place]) 12/14/2010  . Type 2 diabetes mellitus ([place]) 12/14/2010  . Hyperlipidemia 12/14/2010    [place] ,[place], [place]  03/21/2017, 12:50 PM  [facility] [address] [place], [place], [address] Phone: [phone]   Fax:  [phone]   Name: Collin [name] MRN: [id number] Date of  Birth: 1941/03/06

## 2017-03-21 NOTE — Patient Instructions (Signed)
Use the baking soda/saltwater rinse for thinning your saliva - as needed. As your saliva thins, do your exercises twice each day to keep your muscles from getting hard. Use Biotene products for your dry mouth. They may help.

## 2017-03-22 ENCOUNTER — Telehealth: Payer: Self-pay | Admitting: *Deleted

## 2017-03-22 ENCOUNTER — Other Ambulatory Visit: Payer: Self-pay | Admitting: *Deleted

## 2017-03-22 DIAGNOSIS — Z79899 Other long term (current) drug therapy: Secondary | ICD-10-CM

## 2017-03-22 LAB — CUP PACEART REMOTE DEVICE CHECK
Battery Remaining Longevity: 144 mo
Battery Remaining Percentage: 100 %
Brady Statistic RV Percent Paced: 11 %
Date Time Interrogation Session: 20180716060100
HighPow Impedance: 45 Ohm
Implantable Lead Implant Date: 20060209
Implantable Lead Location: 753860
Implantable Lead Model: 158
Implantable Lead Serial Number: 156422
Implantable Pulse Generator Implant Date: 20150904
Lead Channel Impedance Value: 439 Ohm
Lead Channel Pacing Threshold Amplitude: 0.7 V
Lead Channel Pacing Threshold Pulse Width: 0.4 ms
Lead Channel Setting Pacing Amplitude: 2.4 V
Lead Channel Setting Pacing Pulse Width: 0.4 ms
Lead Channel Setting Sensing Sensitivity: 0.5 mV
Pulse Gen Serial Number: 191056

## 2017-03-22 LAB — BASIC METABOLIC PANEL
BUN/Creatinine Ratio: 29 — ABNORMAL HIGH (ref 10–24)
BUN: 18 mg/dL (ref 8–27)
CO2: 26 mmol/L (ref 20–29)
Calcium: 9.1 mg/dL (ref 8.6–10.2)
Chloride: 96 mmol/L (ref 96–106)
Creatinine, Ser: 0.63 mg/dL — ABNORMAL LOW (ref 0.76–1.27)
GFR calc Af Amer: 111 mL/min/{1.73_m2} (ref >59)
GFR calc non Af Amer: 96 mL/min/{1.73_m2} (ref >59)
Glucose: 186 mg/dL — ABNORMAL HIGH (ref 65–99)
Potassium: 4 mmol/L (ref 3.5–5.2)
Sodium: 140 mmol/L (ref 134–144)

## 2017-03-22 LAB — PRO B NATRIURETIC PEPTIDE: NT-Pro BNP: 825 pg/mL — ABNORMAL HIGH (ref 0–486)

## 2017-03-22 NOTE — Telephone Encounter (Signed)
Aware of lab results ./cy

## 2017-03-22 NOTE — Telephone Encounter (Signed)
-----   Message from Dorris Carnes V, MD sent at 03/22/2017 12:32 PM EDT ----- Kidney function OK FLuid is still up   If still with edema he can icrease lasix to 60 mg every 3rd day  Keep on 40 the other days BMET in 4 wks

## 2017-03-22 NOTE — Telephone Encounter (Signed)
Lmptcb jw 03/22/17 

## 2017-03-22 NOTE — Telephone Encounter (Signed)
New message    Pt wife is returning your call about lab results

## 2017-03-23 ENCOUNTER — Encounter: Payer: Self-pay | Admitting: Cardiology

## 2017-03-24 ENCOUNTER — Ambulatory Visit (HOSPITAL_COMMUNITY)
Admission: RE | Admit: 2017-03-24 | Discharge: 2017-03-24 | Disposition: A | Discharge status: Home / Self Care | Payer: Medicare HMO | Source: Ambulatory Visit | Attending: Cardiology | Admitting: Cardiology

## 2017-03-24 DIAGNOSIS — M7989 Other specified soft tissue disorders: Secondary | ICD-10-CM | POA: Diagnosis not present

## 2017-03-24 DIAGNOSIS — I5022 Chronic systolic (congestive) heart failure: Secondary | ICD-10-CM | POA: Diagnosis not present

## 2017-03-27 ENCOUNTER — Telehealth: Payer: Self-pay | Admitting: Internal Medicine

## 2017-03-27 NOTE — Telephone Encounter (Signed)
Please contact pt   Tell him LE dopplers were negative for clot

## 2017-03-29 DIAGNOSIS — I428 Other cardiomyopathies: Secondary | ICD-10-CM | POA: Diagnosis not present

## 2017-03-29 DIAGNOSIS — C140 Malignant neoplasm of pharynx, unspecified: Secondary | ICD-10-CM | POA: Diagnosis not present

## 2017-03-29 NOTE — Telephone Encounter (Signed)
Pt made aware of normal LE Venous Duplex through his active mychart account.  Pt did view this result through his account.

## 2017-03-30 ENCOUNTER — Ambulatory Visit (INDEPENDENT_AMBULATORY_CARE_PROVIDER_SITE_OTHER): Payer: Medicare HMO | Admitting: General Practice

## 2017-03-30 DIAGNOSIS — I4891 Unspecified atrial fibrillation: Secondary | ICD-10-CM | POA: Diagnosis not present

## 2017-03-30 DIAGNOSIS — Z5181 Encounter for therapeutic drug level monitoring: Secondary | ICD-10-CM

## 2017-03-30 DIAGNOSIS — C01 Malignant neoplasm of base of tongue: Secondary | ICD-10-CM | POA: Diagnosis not present

## 2017-03-30 LAB — POCT INR: INR: 2.3

## 2017-03-30 NOTE — Patient Instructions (Signed)
Pre visit review using our clinic review tool, if applicable. No additional management support is needed unless otherwise documented below in the visit note. 

## 2017-04-01 ENCOUNTER — Other Ambulatory Visit: Payer: Self-pay | Admitting: Internal Medicine

## 2017-04-01 MED ORDER — DILTIAZEM HCL 90 MG PO TABS: 90.0000 mg | ORAL_TABLET | Freq: Every day | ORAL | 11 refills | Status: DC

## 2017-04-01 NOTE — Addendum Note (Signed)
Addended by: Derl Barrow on: 04/01/2017 02:26 PM   Modules accepted: Orders

## 2017-04-04 ENCOUNTER — Ambulatory Visit: Payer: Medicare HMO

## 2017-04-04 DIAGNOSIS — C140 Malignant neoplasm of pharynx, unspecified: Secondary | ICD-10-CM | POA: Diagnosis not present

## 2017-04-04 DIAGNOSIS — I428 Other cardiomyopathies: Secondary | ICD-10-CM | POA: Diagnosis not present

## 2017-04-06 ENCOUNTER — Encounter: Payer: Self-pay | Admitting: Cardiology

## 2017-04-25 ENCOUNTER — Other Ambulatory Visit: Payer: Medicare HMO | Admitting: *Deleted

## 2017-04-25 ENCOUNTER — Ambulatory Visit: Payer: Medicare HMO | Attending: Radiation Oncology

## 2017-04-25 ENCOUNTER — Encounter: Payer: Self-pay | Admitting: Physician Assistant

## 2017-04-25 ENCOUNTER — Ambulatory Visit (INDEPENDENT_AMBULATORY_CARE_PROVIDER_SITE_OTHER): Payer: Medicare HMO | Admitting: Physician Assistant

## 2017-04-25 VITALS — BP 112/60 | HR 74 | Ht 72.0 in | Wt 236.1 lb

## 2017-04-25 DIAGNOSIS — L599 Disorder of the skin and subcutaneous tissue related to radiation, unspecified: Secondary | ICD-10-CM | POA: Diagnosis not present

## 2017-04-25 DIAGNOSIS — R600 Localized edema: Secondary | ICD-10-CM | POA: Diagnosis not present

## 2017-04-25 DIAGNOSIS — Z79899 Other long term (current) drug therapy: Secondary | ICD-10-CM

## 2017-04-25 DIAGNOSIS — M6281 Muscle weakness (generalized): Secondary | ICD-10-CM | POA: Insufficient documentation

## 2017-04-25 DIAGNOSIS — I481 Persistent atrial fibrillation: Secondary | ICD-10-CM

## 2017-04-25 DIAGNOSIS — I4819 Other persistent atrial fibrillation: Secondary | ICD-10-CM

## 2017-04-25 DIAGNOSIS — R1310 Dysphagia, unspecified: Secondary | ICD-10-CM | POA: Diagnosis not present

## 2017-04-25 DIAGNOSIS — Z95810 Presence of automatic (implantable) cardiac defibrillator: Secondary | ICD-10-CM

## 2017-04-25 DIAGNOSIS — I5022 Chronic systolic (congestive) heart failure: Secondary | ICD-10-CM

## 2017-04-25 DIAGNOSIS — R293 Abnormal posture: Secondary | ICD-10-CM | POA: Insufficient documentation

## 2017-04-25 DIAGNOSIS — I890 Lymphedema, not elsewhere classified: Secondary | ICD-10-CM | POA: Diagnosis not present

## 2017-04-25 NOTE — Patient Instructions (Signed)
Medication Instructions:  1. Your physician recommends that you continue on your current medications as directed. Please refer to the Current Medication list given to you today.   Labwork: 1. TODAY BMET, PRO BNP  Testing/Procedures: 1. Your physician has requested that you have an echocardiogram. Echocardiography is a painless test that uses sound waves to create images of your heart. It provides your doctor with information about the size and shape of your heart and how well your heart's chambers and valves are working. This procedure takes approximately one hour. There are no restrictions for this procedure.    Follow-Up: SCOTT WEAVER, PAC IN 6 WEEKS SAME DAY [name] IS IN THE OFFICE IF POSSIBLE  Any Other Special Instructions Will Be Listed Below (If Applicable).     If you need a refill on your cardiac medications before your next appointment, please call your pharmacy.

## 2017-04-25 NOTE — Patient Instructions (Signed)
Drink something every day - 1/2-1 cup of water, 3 times a day. Do this to keep your muscles from getting weak (atrophy). Do your exercises twice a day! This should be easier especially now that your saliva is improving.

## 2017-04-25 NOTE — Progress Notes (Signed)
Cardiology Office Note:    Date:  04/25/2017   ID:  Collin [name], DOB July 12, 1941, MRN [id number]  PCP:  [name], MD  Cardiologist:  [name]   Electrophysiologist: [name]    Referring MD: [name], MD   Chief Complaint  Patient presents with  . Congestive Heart Failure    follow up    History of Present Illness:    Collin [name] is a 76 y.o. male with a hx of NICM, systolic heart failure, atrial fibrillation, diabetes, hyperlipidemia, sleep apnea.  He was established with [name] in 4/12.  He moved to [place] from [place] in 2011.  He has a history of normal coronary arteries by cardiac catheterization in 2002.  Prior EF was 28%. He is status post ICD and is followed by Dr. [name]. Most recent echocardiogram in 2015 demonstrated normal LV function with an EF of 55-60%.  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  He has a feeding tube.  Last seen by Dr. Tenny Craw 7/18. He was volume overloaded. Lasix was adjusted. Lower extremity venous ultrasound was obtained in her lower extremity edema. This was negative for DVT.  Mr. Skillin returns for follow up.  He is here with his wife. His edema is not much better.  He denies significant shortness of breath.  He denies chest pain. He sleeps in a recliner b/c of his feeding tube.  He denies paroxysmal nocturnal dyspnea.  He denies syncope.    Prior CV studies:   The following studies were reviewed today:  LE venous US 03/24/17 No evidence of lower extremity deep or superficial venous thrombus or incompetence, bilaterally.  Echo 04/22/14 Mod conc LVH, EF 55-60, no RWMA, mildly dilated ascending aorta (root 40 mm, ascending aorta 37 mm), severe LAE, severe RAE, lipoma hypertrophy of the Atrial septum, mild TR  Echo 04/17/13 Moderate LVH, EF 35-40, diffuse HK, moderate to severe LAE, mild RAE  Echo 5/12 Moderate LVH, EF 50-55, moderate LAE, mild RAE   Past  Medical History:  Diagnosis Date  . Atrial fibrillation (HCC)   . Cardiomyopathy, nonischemic (HCC) 1998  . Diabetes mellitus   . Heart disease   . History of radiation therapy 12/07/16- 01/24/17   Base of Tongue 70 Gy in 35 fractions  . Hyperlipidemia   . OSA (obstructive sleep apnea)   . Shingles   . 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 It sometimes shows as a shadow on scans.     Past Surgical History:  Procedure Laterality Date  . CARDIAC DEFIBRILLATOR PLACEMENT  2006  . CATARACT EXTRACTION Bilateral    01/07/2016. 01/22/2016  . CYST EXCISION     multiple drainage to a cyst in his neck.   Marland Kitchen DIRECT LARYNGOSCOPY N/A 11/16/2016   Procedure: DIRECT LARYNGOSCOPY AND BIOPSY;  Surgeon: [name], MD;  Location[facility] OR;  Service: ENT;  Laterality: N/A;  . HERNIA REPAIR     1950's  . IMPLANTABLE CARDIOVERTER DEFIBRILLATOR (ICD) GENERATOR CHANGE N/A 05/10/2014   Procedure: ICD GENERATOR CHANGE;  Surgeon: [name], MD;  Location: [facility] CATH LAB;  Service: Cardiovascular;  Laterality: N/A;  . IR GASTROSTOMY TUBE MOD SED  12/27/2016  . IR PATIENT EVAL TECH 0-60 MINS  01/17/2017  . MASS EXCISION Right 11/16/2016   Procedure: EXCISION RIGHT NECK LIPOMA;  Surgeon: Cristal Deer  Braxton Feathers, MD;  Location: [facility] OR;  Service: ENT;  Laterality: Right;  . MOHS SURGERY  01/01/2015   to top of head  . MOUTH SURGERY     teeth removed 4 teeth extracted 02/2010    Current Medications: Current Meds  Medication Sig  . acetaminophen (TYLENOL) 500 MG tablet Take 1,000 mg by mouth every 6 (six) hours as needed for moderate pain.  . carvedilol (COREG) 25 MG tablet TAKE 1 TABLET TWICE DAILY WITH MEALS  . diltiazem (CARDIZEM) 90 MG tablet Take 1 tablet (90 mg total) by mouth daily.  . furosemide (LASIX) 20 MG tablet Take 20 mg by mouth daily. THEN AN ADDITIONAL TABLET BY MOUTH IF NEEDED EVERY THIRD DAY  . Nutritional Supplements (FEEDING SUPPLEMENT,  OSMOLITE 1.5 CAL,) LIQD Give 2 cans of formula at 8am and 12 noon. Give additional 1 1/2 cans at 4pm and 8pm feeding.   Flush with 60ml of water before and after feeding 4 times per day  . Nutritional Supplements (PROMOD) LIQD Give 30ml (1 oz) 2 times per day via tube. Flush with 60ml of water before and after given.  . polyethylene glycol (MIRALAX) packet Take 17 g by mouth daily as needed. Available OTC  . potassium chloride (K-DUR) 10 MEQ tablet Dissolve 1 tablet in water and administer via PEG tube daily  . sodium fluoride (PREVIDENT 5000 PLUS) 1.1 % CREA dental cream Apply gel to tooth brush. Brush teeth for 2 minutes. Spit out excess-DO NOT swallow. Repeat nightly.  . warfarin (COUMADIN) 5 MG tablet TAKE AS DIRECTED BY ANTICOAGULATION CLINIC.     Allergies:   Niaspan [niacin er]   Social History   Social History  . Marital status: Married    Spouse name: [name]  . Number of children: 3  . Years of education: N/A   Occupational History  . Retired Engineer, materials    Social History Main Topics  . Smoking status: Never Smoker  . Smokeless tobacco: Never Used  . Alcohol use Yes     Comment: he has a past history of social drinking  . Drug use: No  . Sexual activity: Not Asked   Other Topics Concern  . None   Social History Narrative  . None     Family Hx: The patient's family history includes Heart disease in his mother; Hypertension in his father; Lupus in his mother; Stroke in his father; Sudden death in his mother.  ROS:   Please see the history of present illness.    Review of Systems  HENT: Positive for hearing loss.   Eyes: Positive for visual disturbance.  Cardiovascular: Positive for dyspnea on exertion and leg swelling.  Respiratory: Positive for cough.    All other systems reviewed and are negative.   EKGs/Labs/Other Test Reviewed:    EKG:  EKG is   ordered today.  The ekg ordered today demonstrates AFib, HR 73, transient V pacing  Recent  Labs: 09/16/2016: BNP CANCELED 11/15/2016: Magnesium 2.1 12/27/2016: Hemoglobin 13.3; Platelets 227 03/11/2017: ALT 21; TSH 1.360 03/21/2017: BUN 18; Creatinine, Ser 0.63; NT-Pro BNP 825; Potassium 4.0; Sodium 140   Recent Lipid Panel Lab Results  Component Value Date/Time   CHOL 156 09/16/2016 02:44 PM   TRIG 324 (H) 09/16/2016 02:44 PM   HDL 42 09/16/2016 02:44 PM   CHOLHDL 3.7 09/16/2016 02:44 PM   CHOLHDL 5 11/13/2015 03:25 PM   LDLCALC 49 09/16/2016 02:44 PM   LDLDIRECT 72.0 11/13/2015 03:25 PM    Physical  Exam:    VS:  BP 112/60   Pulse 74   Ht 6' (1.829 m)   Wt 236 lb 1.9 oz (107.1 kg)   BMI 32.02 kg/m     Wt Readings from Last 3 Encounters:  04/25/17 236 lb 1.9 oz (107.1 kg)  03/14/17 239 lb (108.4 kg)  02/11/17 245 lb 9.6 oz (111.4 kg)     Physical Exam  Constitutional: He is oriented to person, place, and time. He appears well-developed and well-nourished. No distress.  HENT:  Head: Normocephalic and atraumatic.  Eyes: No scleral icterus.  Neck: Normal range of motion.  Large submandibular cystic structure, mobile  Cardiovascular: Normal rate, S1 normal and S2 normal.  An irregularly irregular rhythm present.  No murmur heard. Pulmonary/Chest: Effort normal and breath sounds normal. He has no wheezes. He has no rhonchi. He has no rales.  Abdominal: Soft. He exhibits no distension. There is no hepatomegaly.  Feeding tube in place  Musculoskeletal: He exhibits edema (2-3+ pedal edema bilaterally ).  Neurological: He is alert and oriented to person, place, and time.  Skin: Skin is warm and dry.  Psychiatric: He has a normal mood and affect.    ASSESSMENT:    1. Bilateral leg edema   2. Chronic systolic CHF (congestive heart failure) (HCC)   3. Persistent atrial fibrillation (HCC)   4. ICD (implantable cardioverter-defibrillator) in place    PLAN:    In order of problems listed above:  1. Bilateral leg edema Leg edema is persistent. He has no edema past  his mid calf.  I cannot assess JVD.  He has no scrotal edema, presacral edema or abdominal distention.  His [place] did not suggest venous incompetence.  His most recent albumin was ok and TSH was normal.  I reviewed his case with [name].  His most recent PET CT did not show any obstruction in his abdomen.  His recent BNP was elevated.  -  Repeat BMET, BNP  -  Update echocardiogram  -  If BNP elevated increase Lasix or change to Torsemide 20  -  FU again in 6 weeks  2. Chronic systolic CHF (congestive heart failure) (HCC)  EF has improved over time.  Will get a follow up echocardiogram given recent leg edema.  Check BNP as noted. Continue beta-blocker.  He is on Diltiazem to help with rate control.   3. Persistent atrial fibrillation (HCC)  Rate controlled.  He remains on Coumadin.   4. ICD (implantable cardioverter-defibrillator) in place FU with EP as planned.    Dispo:  Return in about 6 weeks (around 06/06/2017) for Close Follow Up, w/ Tereso Newcomer, PA-C.   Medication Adjustments/Labs and Tests Ordered: Current medicines are reviewed at length with the patient today.  Concerns regarding medicines are outlined above.  Tests Ordered: Orders Placed This Encounter  Procedures  . Basic Metabolic Panel (BMET)  . Pro b natriuretic peptide (BNP)  . EKG 12-Lead  . ECHOCARDIOGRAM COMPLETE   Medication Changes: No orders of the defined types were placed in this encounter.   Signed, Tereso Newcomer, PA-C  04/25/2017 3:44 PM    [facility] [address] [place], [place], [place]  [address] Phone: [phone]; Fax: [phone]

## 2017-04-25 NOTE — Therapy (Signed)
[facility] [address] [place], [place], [address] Phone: [phone]   Fax:  [phone]  Speech Language Pathology Treatment  Patient Details  Name: Collin [name] MRN: [id number] Date of Birth: 08/09/1941 Referring Provider: Lonie Peak MD  Encounter Date: 04/25/2017      End of Session - 04/25/17 1347    Visit Number 5   Number of Visits 7   Date for SLP Re-Evaluation 07/01/17   SLP Start Time 1100   SLP Stop Time  1140   SLP Time Calculation (min) 40 min   Activity Tolerance Patient tolerated treatment well      Past Medical History:  Diagnosis Date  . Atrial fibrillation (HCC)   . Cardiomyopathy, nonischemic (HCC) 1998  . Diabetes mellitus   . Heart disease   . History of radiation therapy 12/07/16- 01/24/17   Base of Tongue 70 Gy in 35 fractions  . Hyperlipidemia   . OSA (obstructive sleep apnea)   . Shingles   . 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 It sometimes shows as a shadow on scans.     Past Surgical History:  Procedure Laterality Date  . CARDIAC DEFIBRILLATOR PLACEMENT  2006  . CATARACT EXTRACTION Bilateral    01/07/2016. 01/22/2016  . CYST EXCISION     multiple drainage to a cyst in his neck.   Marland Kitchen DIRECT LARYNGOSCOPY N/A 11/16/2016   Procedure: DIRECT LARYNGOSCOPY AND BIOPSY;  Surgeon: [name], MD;  Location: [facility] OR;  Service: ENT;  Laterality: N/A;  . HERNIA REPAIR     1950's  . IMPLANTABLE CARDIOVERTER DEFIBRILLATOR (ICD) GENERATOR CHANGE N/A 05/10/2014   Procedure: ICD GENERATOR CHANGE;  Surgeon: [name], MD;  Location: [facility] CATH LAB;  Service: Cardiovascular;  Laterality: N/A;  . IR GASTROSTOMY TUBE MOD SED  12/27/2016  . IR PATIENT EVAL TECH 0-60 MINS  01/17/2017  . MASS EXCISION Right 11/16/2016   Procedure: EXCISION RIGHT NECK LIPOMA;  Surgeon: [name], MD;  Location: [facility] OR;  Service: ENT;   Laterality: Right;  . MOHS SURGERY  01/01/2015   to top of head  . MOUTH SURGERY     teeth removed 4 teeth extracted 02/2010    There were no vitals filed for this visit.      Subjective Assessment - 04/25/17 1112    Subjective "Saliva ia still very thick but not as heavy."   Patient is accompained by: Family member  wife   Currently in Pain? No/denies               ADULT SLP TREATMENT - 04/25/17 1118      General Information   Behavior/Cognition Alert;Cooperative;Pleasant mood     Treatment Provided   Treatment provided Dysphagia     Dysphagia Treatment   Temperature Spikes Noted No   Respiratory Status Room air   Treatment Methods Skilled observation;Therapeutic exercise;Patient/caregiver education   Patient observed directly with PO's No   Other treatment/comments Pt with more pronounced lymphedema than last session. Pt still with 6 cans tube feed/day. Primarily not trying POs due to the thickened saliva, per pt. SLP practiced sips H2O with couble swallows, throat clear, then effortful swallow. Told pt/wife that SLP wanted pt to have 1/2 to 1 cup H2O x3/day to inhibit muscle disuse atrophy.  Pt req'd min-mod A occasionally with exercises today, with SLP demonstrating for pt. SLP ensured wife  improve the following deficits and impairments:   Dysphagia, unspecified type    Problem List Patient Active Problem List   Diagnosis Date Noted  . 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 Thrush, oral 12/21/2016  . Weight loss, abnormal 12/21/2016  . Malignant neoplasm of base of tongue ([place]) 11/24/2016  . Umbilical hernia without obstruction and without gangrene   . Pressure injury of skin 11/14/2016  . Tongue mass: Per Ct neck 11/10/2016 11/12/2016  . SBO (small bowel obstruction) ([place]) 11/11/2016  . Encounter for therapeutic drug monitoring 10/25/2013  . Diabetic peripheral neuropathy ([place]) 10/14/2011  . Hypotension 08/20/2011  . Acute renal failure ([place]) 08/20/2011  . OSA (obstructive sleep apnea) 04/15/2011  . ICD-[employer] 03/30/2011  . Chronic systolic CHF (congestive heart failure) ([place]) 03/30/2011  . PAF (paroxysmal atrial fibrillation) ([place]) 12/14/2010  . Nonischemic cardiomyopathy ([place]) 12/14/2010  . Type 2 diabetes mellitus ([place]) 12/14/2010  . Hyperlipidemia 12/14/2010    [facility] ,[place], CCC-SLP  04/25/2017, 1:55 PM  [place] [address] [place], [place], [address] Phone: [phone]   Fax:  [phone]   Name: Collin [name] MRN: [id number] Date of Birth: 10/27/1940  [facility] [address] [place], [place], [address] Phone: [phone]   Fax:  [phone]  Speech Language Pathology Treatment  Patient Details  Name: Collin [name] MRN: [id number] Date of Birth: 08/09/1941 Referring Provider: Lonie Peak MD  Encounter Date: 04/25/2017      End of Session - 04/25/17 1347    Visit Number 5   Number of Visits 7   Date for SLP Re-Evaluation 07/01/17   SLP Start Time 1100   SLP Stop Time  1140   SLP Time Calculation (min) 40 min   Activity Tolerance Patient tolerated treatment well      Past Medical History:  Diagnosis Date  . Atrial fibrillation (HCC)   . Cardiomyopathy, nonischemic (HCC) 1998  . Diabetes mellitus   . Heart disease   . History of radiation therapy 12/07/16- 01/24/17   Base of Tongue 70 Gy in 35 fractions  . Hyperlipidemia   . OSA (obstructive sleep apnea)   . Shingles   . 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 It sometimes shows as a shadow on scans.     Past Surgical History:  Procedure Laterality Date  . CARDIAC DEFIBRILLATOR PLACEMENT  2006  . CATARACT EXTRACTION Bilateral    01/07/2016. 01/22/2016  . CYST EXCISION     multiple drainage to a cyst in his neck.   Marland Kitchen DIRECT LARYNGOSCOPY N/A 11/16/2016   Procedure: DIRECT LARYNGOSCOPY AND BIOPSY;  Surgeon: [name], MD;  Location: [facility] OR;  Service: ENT;  Laterality: N/A;  . HERNIA REPAIR     1950's  . IMPLANTABLE CARDIOVERTER DEFIBRILLATOR (ICD) GENERATOR CHANGE N/A 05/10/2014   Procedure: ICD GENERATOR CHANGE;  Surgeon: [name], MD;  Location: [facility] CATH LAB;  Service: Cardiovascular;  Laterality: N/A;  . IR GASTROSTOMY TUBE MOD SED  12/27/2016  . IR PATIENT EVAL TECH 0-60 MINS  01/17/2017  . MASS EXCISION Right 11/16/2016   Procedure: EXCISION RIGHT NECK LIPOMA;  Surgeon: [name], MD;  Location: [facility] OR;  Service: ENT;   Laterality: Right;  . MOHS SURGERY  01/01/2015   to top of head  . MOUTH SURGERY     teeth removed 4 teeth extracted 02/2010    There were no vitals filed for this visit.      Subjective Assessment - 04/25/17 1112    Subjective "Saliva ia still very thick but not as heavy."   Patient is accompained by: Family member  wife   Currently in Pain? No/denies               ADULT SLP TREATMENT - 04/25/17 1118      General Information   Behavior/Cognition Alert;Cooperative;Pleasant mood     Treatment Provided   Treatment provided Dysphagia     Dysphagia Treatment   Temperature Spikes Noted No   Respiratory Status Room air   Treatment Methods Skilled observation;Therapeutic exercise;Patient/caregiver education   Patient observed directly with PO's No   Other treatment/comments Pt with more pronounced lymphedema than last session. Pt still with 6 cans tube feed/day. Primarily not trying POs due to the thickened saliva, per pt. SLP practiced sips H2O with couble swallows, throat clear, then effortful swallow. Told pt/wife that SLP wanted pt to have 1/2 to 1 cup H2O x3/day to inhibit muscle disuse atrophy.  Pt req'd min-mod A occasionally with exercises today, with SLP demonstrating for pt. SLP ensured wife

## 2017-04-26 ENCOUNTER — Telehealth: Payer: Self-pay | Admitting: *Deleted

## 2017-04-26 LAB — BASIC METABOLIC PANEL
BUN/Creatinine Ratio: 35 — ABNORMAL HIGH (ref 10–24)
BUN: 20 mg/dL (ref 8–27)
CO2: 27 mmol/L (ref 20–29)
Calcium: 9.3 mg/dL (ref 8.6–10.2)
Chloride: 99 mmol/L (ref 96–106)
Creatinine, Ser: 0.57 mg/dL — ABNORMAL LOW (ref 0.76–1.27)
GFR calc Af Amer: 115 mL/min/{1.73_m2} (ref >59)
GFR calc non Af Amer: 100 mL/min/{1.73_m2} (ref >59)
Glucose: 145 mg/dL — ABNORMAL HIGH (ref 65–99)
Potassium: 4 mmol/L (ref 3.5–5.2)
Sodium: 143 mmol/L (ref 134–144)

## 2017-04-26 LAB — PRO B NATRIURETIC PEPTIDE: NT-Pro BNP: 676 pg/mL — ABNORMAL HIGH (ref 0–486)

## 2017-04-26 NOTE — Telephone Encounter (Signed)
DPR ok to s/w pt's wife who has been notified of lab results and findings by phone with verbal understanding. When I confirmed dose of lasix 20 mg daily with an extra tab PRN, pt's wife states different directions to me that [name] had given them with lab results from 03/21/17. Pt wife states pt is taking lasix 40 mg once a day with an extra 20 mg tablet every third day as needed, per instructions from [name]. I informed Mrs. Collin [name], Collin [name] has already left for the day. I explained to her that neither [name], [facility] or I will be in the office for the rest of the week however; I will send him my note for further advice. I told her that either [name] nurse [name] or Triage nurse will call them back new recommendations. [name] thanked me for my call and my help. I advised to have pt continue the lasix like he is doing currently per [name] until we call back with new instructions. [name] is agreeable to plan of care for the pt.

## 2017-04-26 NOTE — Telephone Encounter (Signed)
-----   Message from Liliane Shi, [place] sent at 04/26/2017  5:17 PM EDT ----- Please call the patient Kidney function is stable. BNP still a little high. Chart indicates he is back on Lasix 20 mg Once daily  Increase Lasix to 40 mg Once daily BMET, BNP 1 week. [name], PA-C    04/26/2017 5:15 PM

## 2017-04-27 ENCOUNTER — Other Ambulatory Visit: Payer: Self-pay | Admitting: *Deleted

## 2017-04-27 DIAGNOSIS — Z79899 Other long term (current) drug therapy: Secondary | ICD-10-CM

## 2017-04-27 DIAGNOSIS — I5022 Chronic systolic (congestive) heart failure: Secondary | ICD-10-CM

## 2017-04-27 DIAGNOSIS — N179 Acute kidney failure, unspecified: Secondary | ICD-10-CM

## 2017-04-28 ENCOUNTER — Other Ambulatory Visit: Payer: Self-pay

## 2017-04-28 ENCOUNTER — Ambulatory Visit (HOSPITAL_COMMUNITY): Payer: Medicare HMO | Attending: Internal Medicine

## 2017-04-28 DIAGNOSIS — C269 Malignant neoplasm of ill-defined sites within the digestive system: Secondary | ICD-10-CM | POA: Insufficient documentation

## 2017-04-28 DIAGNOSIS — E119 Type 2 diabetes mellitus without complications: Secondary | ICD-10-CM | POA: Diagnosis not present

## 2017-04-28 DIAGNOSIS — I5022 Chronic systolic (congestive) heart failure: Secondary | ICD-10-CM | POA: Insufficient documentation

## 2017-04-28 DIAGNOSIS — I428 Other cardiomyopathies: Secondary | ICD-10-CM | POA: Insufficient documentation

## 2017-04-28 DIAGNOSIS — E785 Hyperlipidemia, unspecified: Secondary | ICD-10-CM | POA: Insufficient documentation

## 2017-04-28 DIAGNOSIS — R600 Localized edema: Secondary | ICD-10-CM | POA: Diagnosis not present

## 2017-04-28 DIAGNOSIS — Z95810 Presence of automatic (implantable) cardiac defibrillator: Secondary | ICD-10-CM | POA: Diagnosis not present

## 2017-04-28 DIAGNOSIS — I481 Persistent atrial fibrillation: Secondary | ICD-10-CM | POA: Diagnosis not present

## 2017-04-28 DIAGNOSIS — G4730 Sleep apnea, unspecified: Secondary | ICD-10-CM | POA: Diagnosis not present

## 2017-04-28 LAB — ECHOCARDIOGRAM COMPLETE
LA vol A4C: 97.4 ml
LA vol index: 52.1 mL/m2
LA vol: 123 mL
LVOT VTI: 14.8 cm
LVOT peak vel: 80.6 cm/s
Lateral S' vel: 11.6 cm/s
TAPSE: 19.5 mm

## 2017-04-29 ENCOUNTER — Encounter: Payer: Self-pay | Admitting: Physician Assistant

## 2017-04-29 DIAGNOSIS — I428 Other cardiomyopathies: Secondary | ICD-10-CM | POA: Diagnosis not present

## 2017-04-29 DIAGNOSIS — C140 Malignant neoplasm of pharynx, unspecified: Secondary | ICD-10-CM | POA: Diagnosis not present

## 2017-05-02 ENCOUNTER — Telehealth: Payer: Self-pay | Admitting: Physician Assistant

## 2017-05-02 ENCOUNTER — Telehealth: Payer: Self-pay | Admitting: *Deleted

## 2017-05-02 ENCOUNTER — Ambulatory Visit: Payer: Medicare HMO

## 2017-05-02 NOTE — Telephone Encounter (Signed)
Oncology Nurse Navigator Documentation  Patient wife returned my call from earlier this afternoon, confirmed availability to attend tomorrow morning's H&N [facility] to be seen by PT and Nutrition.  I provided 0830 arrival to Radiation Waiting following lobby registration Desk 1.  She voiced understanding.  Gayleen Orem, RN, BSN, [place] Oncology Nurse [place] at [place] [phone]

## 2017-05-02 NOTE — Telephone Encounter (Signed)
Called pt and left message for pt to call back for echocardiogram results.

## 2017-05-02 NOTE — Telephone Encounter (Signed)
-----   Message from Liliane Shi, [place] sent at 04/29/2017  7:23 AM EDT ----- Please call the patient. The echocardiogram shows normal ejection fraction.  Continue current medications and follow up as planned.  Please route a copy of this study result to his PCP:  Eulas Post, MD  [name], PA-C    04/29/2017 7:21 AM

## 2017-05-03 ENCOUNTER — Ambulatory Visit: Payer: Medicare HMO | Admitting: Physical Therapy

## 2017-05-03 ENCOUNTER — Ambulatory Visit: Payer: Medicare HMO

## 2017-05-03 ENCOUNTER — Ambulatory Visit
Admission: RE | Admit: 2017-05-03 | Discharge: 2017-05-03 | Disposition: A | Discharge status: Home / Self Care | Payer: Medicare HMO | Source: Ambulatory Visit | Attending: Radiation Oncology | Admitting: Radiation Oncology

## 2017-05-03 ENCOUNTER — Encounter: Payer: Self-pay | Admitting: *Deleted

## 2017-05-03 ENCOUNTER — Ambulatory Visit: Payer: Medicare HMO | Admitting: Nutrition

## 2017-05-03 DIAGNOSIS — R1310 Dysphagia, unspecified: Secondary | ICD-10-CM | POA: Diagnosis not present

## 2017-05-03 DIAGNOSIS — L599 Disorder of the skin and subcutaneous tissue related to radiation, unspecified: Secondary | ICD-10-CM

## 2017-05-03 DIAGNOSIS — R293 Abnormal posture: Secondary | ICD-10-CM | POA: Diagnosis not present

## 2017-05-03 DIAGNOSIS — I890 Lymphedema, not elsewhere classified: Secondary | ICD-10-CM

## 2017-05-03 DIAGNOSIS — M6281 Muscle weakness (generalized): Secondary | ICD-10-CM | POA: Diagnosis not present

## 2017-05-03 DIAGNOSIS — R600 Localized edema: Secondary | ICD-10-CM

## 2017-05-03 DIAGNOSIS — C01 Malignant neoplasm of base of tongue: Secondary | ICD-10-CM

## 2017-05-03 NOTE — Progress Notes (Signed)
Met with Mr. Pryor during H&N [place].  He was accompanied by his wife, Clarene Critchley.  Provided verbal and written overview of [place], the clinicians who will be seeing him, encouraged him to ask questions during his time with them.  He was seen by Nutrition, PT and SW.  Spoke with him at end of [facility], addressed questions.  Assessed PEG.  Insertion site with scant grayish, non-malodorous discharge; erythematous at insertion with ca 1/2 inch circular extension.  Retention ring at 8 cm mark.  I reviewed cleaning/dressing change procedures.  Recommended he apply antibiotic ointment around insertion site rather than his practice of applying Vaseline.  I adjusted retention ring to 4 cm.  To evaluate further during follow-up with Dr. Isidore Moos this Friday. They were encouraged to call me with needs/concerns.  Gayleen Orem, RN, BSN, [place] at [employer] [phone]

## 2017-05-03 NOTE — Therapy (Signed)
[place], [place], [address] Phone: [phone]   Fax:  [phone]  Physical Therapy Evaluation  Patient Details  Name: Collin [name] MRN: [id number] Date of Birth: August 21, 1941 Referring Provider: Dr. Eppie Gibson  Encounter Date: 05/03/2017      PT End of Session - 05/03/17 1125    Visit Number 1   Number of Visits 9   PT Start Time 0921   PT Stop Time 0955   PT Time Calculation (min) 34 min   Activity Tolerance Patient tolerated treatment well   Behavior During Therapy [facility] for tasks assessed/performed      Past Medical History:  Diagnosis Date  . Atrial fibrillation ([place])   . 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 Diabetes mellitus   . Heart disease   . History of echocardiogram    Echo 8/18:  EF 55-60, no RWMA, severe LAE, normal RVSF, mild RAE  . History of radiation therapy 12/07/16- 01/24/17   Base of Tongue 70 Gy in 35 fractions  . Hyperlipidemia   . OSA (obstructive sleep apnea)   . Shingles   . 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 It sometimes shows as a shadow on scans.     Past Surgical History:  Procedure Laterality Date  . CARDIAC DEFIBRILLATOR PLACEMENT  2006  . CATARACT EXTRACTION Bilateral    01/07/2016. 01/22/2016  . CYST EXCISION     multiple drainage to a cyst in his neck.   Marland Kitchen DIRECT LARYNGOSCOPY N/A 11/16/2016   Procedure: DIRECT LARYNGOSCOPY AND BIOPSY;  Surgeon: Rozetta Nunnery, MD;  Location: [facility];  Service: ENT;  Laterality: N/A;  . HERNIA REPAIR     1950's  . IMPLANTABLE CARDIOVERTER DEFIBRILLATOR (ICD) GENERATOR CHANGE N/A 05/10/2014   Procedure: ICD GENERATOR CHANGE;  Surgeon: [name], MD;  Location: [facility] CATH LAB;  Service: Cardiovascular;  Laterality: N/A;  . IR GASTROSTOMY TUBE MOD SED  12/27/2016  . IR PATIENT EVAL TECH 0-60 MINS  01/17/2017  . MASS EXCISION Right 11/16/2016   Procedure:  EXCISION RIGHT NECK LIPOMA;  Surgeon: Rozetta Nunnery, MD;  Location: [place];  Service: ENT;  Laterality: Right;  . MOHS SURGERY  01/01/2015   to top of head  . MOUTH SURGERY     teeth removed 4 teeth extracted 02/2010    There were no vitals filed for this visit.       Subjective Assessment - 05/03/17 0925    Subjective Feet are swollen; left foot is always swollen due to a collapsed ankle.  Righ ankle started swelling and lasix was increased twice. Had Doppler that was negative for DVT in legs.   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 Patient underwent a PET scan on 11/22/16. This showed hypermetabolic base of tongue mass compatible with known neoplasm. 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  He will lhave XRT only, due to comorbidities of renal failure, type 2 DM, cardiac history, started 12/07/16. Treatment was finished 01/24/17.   Patient Stated Goals get help with swelling   Currently in Pain? No/denies            [facility]  [place], [place], [address] Phone: [phone]   Fax:  [phone]  Physical Therapy Evaluation  Patient Details  Name: Collin [name] MRN: [id number] Date of Birth: August 21, 1941 Referring Provider: Dr. Eppie Gibson  Encounter Date: 05/03/2017      PT End of Session - 05/03/17 1125    Visit Number 1   Number of Visits 9   PT Start Time 0921   PT Stop Time 0955   PT Time Calculation (min) 34 min   Activity Tolerance Patient tolerated treatment well   Behavior During Therapy [facility] for tasks assessed/performed      Past Medical History:  Diagnosis Date  . Atrial fibrillation ([place])   . 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 Diabetes mellitus   . Heart disease   . History of echocardiogram    Echo 8/18:  EF 55-60, no RWMA, severe LAE, normal RVSF, mild RAE  . History of radiation therapy 12/07/16- 01/24/17   Base of Tongue 70 Gy in 35 fractions  . Hyperlipidemia   . OSA (obstructive sleep apnea)   . Shingles   . 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 It sometimes shows as a shadow on scans.     Past Surgical History:  Procedure Laterality Date  . CARDIAC DEFIBRILLATOR PLACEMENT  2006  . CATARACT EXTRACTION Bilateral    01/07/2016. 01/22/2016  . CYST EXCISION     multiple drainage to a cyst in his neck.   Marland Kitchen DIRECT LARYNGOSCOPY N/A 11/16/2016   Procedure: DIRECT LARYNGOSCOPY AND BIOPSY;  Surgeon: Rozetta Nunnery, MD;  Location: [facility];  Service: ENT;  Laterality: N/A;  . HERNIA REPAIR     1950's  . IMPLANTABLE CARDIOVERTER DEFIBRILLATOR (ICD) GENERATOR CHANGE N/A 05/10/2014   Procedure: ICD GENERATOR CHANGE;  Surgeon: [name], MD;  Location: [facility] CATH LAB;  Service: Cardiovascular;  Laterality: N/A;  . IR GASTROSTOMY TUBE MOD SED  12/27/2016  . IR PATIENT EVAL TECH 0-60 MINS  01/17/2017  . MASS EXCISION Right 11/16/2016   Procedure:  EXCISION RIGHT NECK LIPOMA;  Surgeon: Rozetta Nunnery, MD;  Location: [facility];  Service: ENT;  Laterality: Right;  . MOHS SURGERY  01/01/2015   to top of head  . MOUTH SURGERY     teeth removed 4 teeth extracted 02/2010    There were no vitals filed for this visit.       Subjective Assessment - 05/03/17 0925    Subjective Feet are swollen; left foot is always swollen due to a collapsed ankle.  Righ ankle started swelling and lasix was increased twice. Had Doppler that was negative for DVT in legs.   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 Patient underwent a PET scan on 11/22/16. This showed hypermetabolic base of tongue mass compatible with known neoplasm. 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  He will lhave XRT only, due to comorbidities of renal failure, type 2 DM, cardiac history, started 12/07/16. Treatment was finished 01/24/17.   Patient Stated Goals get help with swelling   Currently in Pain? No/denies            [facility]  [place], [place], [address] Phone: [phone]   Fax:  [phone]  Physical Therapy Evaluation  Patient Details  Name: Collin [name] MRN: [id number] Date of Birth: August 21, 1941 Referring Provider: Dr. Eppie Gibson  Encounter Date: 05/03/2017      PT End of Session - 05/03/17 1125    Visit Number 1   Number of Visits 9   PT Start Time 0921   PT Stop Time 0955   PT Time Calculation (min) 34 min   Activity Tolerance Patient tolerated treatment well   Behavior During Therapy [facility] for tasks assessed/performed      Past Medical History:  Diagnosis Date  . Atrial fibrillation ([place])   . 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 Diabetes mellitus   . Heart disease   . History of echocardiogram    Echo 8/18:  EF 55-60, no RWMA, severe LAE, normal RVSF, mild RAE  . History of radiation therapy 12/07/16- 01/24/17   Base of Tongue 70 Gy in 35 fractions  . Hyperlipidemia   . OSA (obstructive sleep apnea)   . Shingles   . 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 It sometimes shows as a shadow on scans.     Past Surgical History:  Procedure Laterality Date  . CARDIAC DEFIBRILLATOR PLACEMENT  2006  . CATARACT EXTRACTION Bilateral    01/07/2016. 01/22/2016  . CYST EXCISION     multiple drainage to a cyst in his neck.   Marland Kitchen DIRECT LARYNGOSCOPY N/A 11/16/2016   Procedure: DIRECT LARYNGOSCOPY AND BIOPSY;  Surgeon: Rozetta Nunnery, MD;  Location: [facility];  Service: ENT;  Laterality: N/A;  . HERNIA REPAIR     1950's  . IMPLANTABLE CARDIOVERTER DEFIBRILLATOR (ICD) GENERATOR CHANGE N/A 05/10/2014   Procedure: ICD GENERATOR CHANGE;  Surgeon: [name], MD;  Location: [facility] CATH LAB;  Service: Cardiovascular;  Laterality: N/A;  . IR GASTROSTOMY TUBE MOD SED  12/27/2016  . IR PATIENT EVAL TECH 0-60 MINS  01/17/2017  . MASS EXCISION Right 11/16/2016   Procedure:  EXCISION RIGHT NECK LIPOMA;  Surgeon: Rozetta Nunnery, MD;  Location: [facility];  Service: ENT;  Laterality: Right;  . MOHS SURGERY  01/01/2015   to top of head  . MOUTH SURGERY     teeth removed 4 teeth extracted 02/2010    There were no vitals filed for this visit.       Subjective Assessment - 05/03/17 0925    Subjective Feet are swollen; left foot is always swollen due to a collapsed ankle.  Righ ankle started swelling and lasix was increased twice. Had Doppler that was negative for DVT in legs.   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 Patient underwent a PET scan on 11/22/16. This showed hypermetabolic base of tongue mass compatible with known neoplasm. 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  He will lhave XRT only, due to comorbidities of renal failure, type 2 DM, cardiac history, started 12/07/16. Treatment was finished 01/24/17.   Patient Stated Goals get help with swelling   Currently in Pain? No/denies            [facility]  Functional Limitation Self care  for lymphedema   Changing and Maintaining Body Position Current Status [phone]) At least 60 percent but less than 80 percent impaired, limited or restricted   Changing and Maintaining Body  Position Goal Status (P0141) At least 20 percent but less than 40 percent impaired, limited or restricted       Problem List Patient Active Problem List   Diagnosis Date Noted  . 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 Thrush, oral 12/21/2016  . Weight loss, abnormal 12/21/2016  . Malignant neoplasm of base of tongue ([place]) 11/24/2016  . Umbilical hernia without obstruction and without gangrene   . Pressure injury of skin 11/14/2016  . Tongue mass: Per Ct neck 11/10/2016 11/12/2016  . SBO (small bowel obstruction) ([place]) 11/11/2016  . Encounter for therapeutic drug monitoring 10/25/2013  . Diabetic peripheral neuropathy ([place]) 10/14/2011  . Hypotension 08/20/2011  . Acute renal failure ([place]) 08/20/2011  . OSA (obstructive sleep apnea) 04/15/2011  . ICD-[employer] 03/30/2011  . Chronic systolic CHF (congestive heart failure) ([place]) 03/30/2011  . PAF (paroxysmal atrial fibrillation) ([place]) 12/14/2010  . Nonischemic cardiomyopathy ([place]) 12/14/2010  . Type 2 diabetes mellitus ([place]) 12/14/2010  . Hyperlipidemia 12/14/2010    [name],[name] 05/03/2017, 11:37 AM  [place], [place], [address] Phone: [phone]   Fax:  [phone]  Name: ELFEGO GIAMMARINO MRN: [id number] Date of Birth: August 20, 1941  [name], PT 05/03/17 11:38 AM

## 2017-05-03 NOTE — Progress Notes (Signed)
Nutrition follow-up completed with patient and his wife, during head and neck clinic for tongue cancer. Final radiation therapy was completed on Monday, May 21. Weight decreased and documented as 234.6 pounds down from 244.8 pounds May 21. Patient reports he has consistently given himself 6 bottles Osmolite 1.5 with 60 mL free water before and after bolus feedings. He states he has not been using ProMod. He states he consumes water but he is not eating He reports no appetite. He complains of thick saliva and dry mouth which inhibit his ability to eat. Noted bilateral edema in feet. Noted some redness around his feeding tube.  RN was notified.  Nutrition diagnosis: Inadequate oral intake continues.  Estimated nutrition needs: 2200-2500 calories, 125-135 grams protein, 2.4 L fluid.  Intervention: Educated patient to continue Osmolite 1.5, 1-1/2 bottles 4 times a day with 60 mL free water before and after bolus feeding. In addition patient should be using ProMod protein supplement 4 times a day via PEG. Recommended patient continue to drink liquids as tolerated and work on increasing oral intake. Stressed importance of patient completing swallowing exercises. Patient educated to consume at least 36 ounces of water daily. Questions were answered.  Teach back method used.  6 bottles Osmolite 1.5+4 ProMod provides 2530 cal, 129 g of protein meeting 100% of estimated nutrition needs  Monitoring, evaluation, goals: Patient will tolerate increased calories and protein to minimize further weight loss and promote healing.  Next visit: To be scheduled as needed.  **Disclaimer: This note was dictated with voice recognition software. Similar sounding words can inadvertently be transcribed and this note may contain transcription errors which may not have been corrected upon publication of note.**

## 2017-05-04 ENCOUNTER — Other Ambulatory Visit: Payer: Medicare HMO | Admitting: *Deleted

## 2017-05-04 ENCOUNTER — Ambulatory Visit: Payer: Medicare HMO | Admitting: Physical Therapy

## 2017-05-04 ENCOUNTER — Telehealth: Payer: Self-pay | Admitting: *Deleted

## 2017-05-04 DIAGNOSIS — Z79899 Other long term (current) drug therapy: Secondary | ICD-10-CM | POA: Diagnosis not present

## 2017-05-04 DIAGNOSIS — I5022 Chronic systolic (congestive) heart failure: Secondary | ICD-10-CM

## 2017-05-04 DIAGNOSIS — N179 Acute kidney failure, unspecified: Secondary | ICD-10-CM | POA: Diagnosis not present

## 2017-05-04 LAB — BASIC METABOLIC PANEL
BUN/Creatinine Ratio: 37 — ABNORMAL HIGH (ref 10–24)
BUN: 25 mg/dL (ref 8–27)
CO2: 27 mmol/L (ref 20–29)
Calcium: 9.4 mg/dL (ref 8.6–10.2)
Chloride: 96 mmol/L (ref 96–106)
Creatinine, Ser: 0.68 mg/dL — ABNORMAL LOW (ref 0.76–1.27)
GFR calc Af Amer: 107 mL/min/{1.73_m2} (ref >59)
GFR calc non Af Amer: 93 mL/min/{1.73_m2} (ref >59)
Glucose: 139 mg/dL — ABNORMAL HIGH (ref 65–99)
Potassium: 3.9 mmol/L (ref 3.5–5.2)
Sodium: 141 mmol/L (ref 134–144)

## 2017-05-04 NOTE — Telephone Encounter (Signed)
Called patient to inform of Pet Scan for 05-17-17- arrival time - 11:30 am @ [facility] Radiology, pt. to be NPO- 6 hrs. prior to test, pt. to get results @ fu visit on 05-20-17 with Dr. Isidore Moos, lvm for a return call

## 2017-05-05 ENCOUNTER — Encounter: Payer: Self-pay | Admitting: Physical Therapy

## 2017-05-05 ENCOUNTER — Ambulatory Visit: Payer: Medicare HMO | Admitting: Physical Therapy

## 2017-05-05 DIAGNOSIS — I890 Lymphedema, not elsewhere classified: Secondary | ICD-10-CM | POA: Diagnosis not present

## 2017-05-05 DIAGNOSIS — R1310 Dysphagia, unspecified: Secondary | ICD-10-CM | POA: Diagnosis not present

## 2017-05-05 DIAGNOSIS — R600 Localized edema: Secondary | ICD-10-CM | POA: Diagnosis not present

## 2017-05-05 DIAGNOSIS — M6281 Muscle weakness (generalized): Secondary | ICD-10-CM | POA: Diagnosis not present

## 2017-05-05 DIAGNOSIS — R293 Abnormal posture: Secondary | ICD-10-CM

## 2017-05-05 DIAGNOSIS — L599 Disorder of the skin and subcutaneous tissue related to radiation, unspecified: Secondary | ICD-10-CM | POA: Diagnosis not present

## 2017-05-05 NOTE — Patient Instructions (Addendum)
Manual Lymph Drainage for face/neck   1) Place hands on areas just above collar bones and do 15-20 stationary circles 2) Place hands on either side of neck and do 15-20 stationary circles  3) Do stationary circles at right armpit area (about 10 times) and the left armpit (about 10 times) 4) Standing at the head of the bed, do stationary circles on each side of the face at the jaw line (15-20) 5) Standing at the head of the bed, do stationary circles on each side of the face on the cheeks (15-20) 6) Standing at the head of the bed, do stationary circles on each side of the face between the eyes and ears (15-20) 7) Repeat step 2 8) Repeat step 1     Do not slide on the skin Only give enough pressure to stretch the skin Make sure to always wash your hands prior to massage  PERFORM ALL EXERCISES GENTLY AND WITH GOOD POSTURE.    20 SECOND HOLD, 3 REPS TO EACH SIDE. 4-5 TIMES EACH DAY.   AROM: Neck Rotation   Turn head slowly to look over one shoulder, then the other.   AROM: Neck Flexion   Bend head forward.   AROM: Lateral Neck Flexion   Slowly tilt head toward one shoulder, then the other.    WALKING  Walking is a great form of exercise to increase your strength, endurance and overall fitness.  A walking program can help you start slowly and gradually build endurance as you go.  Everyone's ability is different, so each person's starting point will be different.  You do not have to follow them exactly.  The are just samples. You should simply find out what's right for you and stick to that program.   In the beginning, you'll start off walking 2-3 times a day for short distances.  As you get stronger, you'll be walking further at just 1-2 times per day.  A. You Can Walk For A Certain Length Of Time Each Day    Walk 5 minutes 3 times per day.  Increase 2 minutes every 2 days (3 times per day).  Work up to 25-30 minutes (1-2 times per day).   Example:   Day 1-2 5  minutes 3 times per day   Day 7-8 12 minutes 2-3 times per day   Day 13-14 25 minutes 1-2 times per day  B. You Can Walk For a Certain Distance Each Day     Distance can be substituted for time.    Example:   3 trips to mailbox (at road)   3 trips to corner of block   3 trips around the block  C. Go to local [employer] and use the track.    Walk for distance ____ around track  Or time ____ minutes  D. Walk ____ Jog ____ Run ___  Please only do the exercises that your therapist has initialed and dated

## 2017-05-05 NOTE — Therapy (Signed)
Plan of Care Patient;Family member/caregiver   Family Member Consulted Wife [name]      Patient will benefit from skilled therapeutic intervention in order to improve the following deficits and impairments:  Increased edema, Decreased range of motion, Decreased knowledge of precautions, Decreased knowledge of use of DME, Postural dysfunction  Visit Diagnosis: Lymphedema, not elsewhere classified  Abnormal posture  Muscle weakness (generalized)     Problem List Patient Active Problem List   Diagnosis Date Noted  . 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 Thrush, oral 12/21/2016  . Weight loss, abnormal 12/21/2016  . Malignant neoplasm of base of tongue ([place]) 11/24/2016  . Umbilical hernia without obstruction and without gangrene   . Pressure injury of skin 11/14/2016  . Tongue mass: Per Ct neck 11/10/2016 11/12/2016  . SBO (small bowel obstruction) ([place]) 11/11/2016  . Encounter for therapeutic drug monitoring 10/25/2013  . Diabetic peripheral neuropathy ([place]) 10/14/2011  . Hypotension 08/20/2011  . Acute renal failure ([place]) 08/20/2011  . OSA (obstructive sleep apnea) 04/15/2011  . ICD-[employer] 03/30/2011  . Chronic systolic CHF (congestive heart failure) ([place]) 03/30/2011  . PAF (paroxysmal atrial fibrillation) ([place]) 12/14/2010  . Nonischemic cardiomyopathy ([place]) 12/14/2010  . Type 2 diabetes mellitus ([place])  12/14/2010  . Hyperlipidemia 12/14/2010    Annia Friendly, PT 05/05/17 9:41 AM  [place], [place], [address] Phone: [phone]   Fax:  [phone]  Name: Collin FRUTIGER MRN: [id number] Date of Birth: 03-Mar-1941  [place], [place], [address] Phone: [phone]   Fax:  [phone]  Physical Therapy Treatment  Patient Details  Name: Collin [name] MRN: [id number] Date of Birth: Jan 18, 1941 Referring Provider: Dr. Eppie Gibson  Encounter Date: 05/05/2017      PT End of Session - 05/05/17 0931    Visit Number 2   Number of Visits 9   PT Start Time 0840   PT Stop Time 0931   PT Time Calculation (min) 51 min   Activity Tolerance Patient tolerated treatment well   Behavior During Therapy [facility] for tasks assessed/performed      Past Medical History:  Diagnosis Date  . Atrial fibrillation ([place])   . 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 Diabetes mellitus   . Heart disease   . History of echocardiogram    Echo 8/18:  EF 55-60, no RWMA, severe LAE, normal RVSF, mild RAE  . History of radiation therapy 12/07/16- 01/24/17   Base of Tongue 70 Gy in 35 fractions  . Hyperlipidemia   . OSA (obstructive sleep apnea)   . Shingles   . 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 It sometimes shows as a shadow on scans.     Past Surgical History:  Procedure Laterality Date  . CARDIAC DEFIBRILLATOR PLACEMENT  2006  . CATARACT EXTRACTION Bilateral    01/07/2016. 01/22/2016  . CYST EXCISION     multiple drainage to a cyst in his neck.   Marland Kitchen DIRECT LARYNGOSCOPY N/A 11/16/2016   Procedure: DIRECT LARYNGOSCOPY AND BIOPSY;  Surgeon: Rozetta Nunnery, MD;  Location: [facility];  Service: ENT;  Laterality: N/A;  . HERNIA REPAIR     1950's  . IMPLANTABLE CARDIOVERTER DEFIBRILLATOR (ICD) GENERATOR CHANGE N/A 05/10/2014   Procedure: ICD GENERATOR CHANGE;  Surgeon: [name], MD;  Location: [facility] CATH LAB;  Service: Cardiovascular;  Laterality: N/A;  . IR GASTROSTOMY TUBE MOD SED  12/27/2016  . IR PATIENT EVAL TECH 0-60 MINS  01/17/2017  . MASS EXCISION Right 11/16/2016   Procedure:  EXCISION RIGHT NECK LIPOMA;  Surgeon: Rozetta Nunnery, MD;  Location: [facility];  Service: ENT;  Laterality: Right;  . MOHS SURGERY  01/01/2015   to top of head  . MOUTH SURGERY     teeth removed 4 teeth extracted 02/2010    There were no vitals filed for this visit.      Subjective Assessment - 05/05/17 0845    Subjective Got my compression stockings but my feet are still swollen.    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 Patient underwent a PET scan on 11/22/16. This showed hypermetabolic base of tongue mass compatible with known neoplasm. 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  He will lhave XRT only, due to comorbidities of renal failure, type 2 DM, cardiac history, started 12/07/16. Treatment was finished 01/24/17.   Patient Stated Goals get help with swelling   Currently in Pain? No/denies                         [facility] Adult PT Treatment/Exercise - 05/05/17 0001      Manual Therapy   Manual Therapy  Plan of Care Patient;Family member/caregiver   Family Member Consulted Wife [name]      Patient will benefit from skilled therapeutic intervention in order to improve the following deficits and impairments:  Increased edema, Decreased range of motion, Decreased knowledge of precautions, Decreased knowledge of use of DME, Postural dysfunction  Visit Diagnosis: Lymphedema, not elsewhere classified  Abnormal posture  Muscle weakness (generalized)     Problem List Patient Active Problem List   Diagnosis Date Noted  . 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 Thrush, oral 12/21/2016  . Weight loss, abnormal 12/21/2016  . Malignant neoplasm of base of tongue ([place]) 11/24/2016  . Umbilical hernia without obstruction and without gangrene   . Pressure injury of skin 11/14/2016  . Tongue mass: Per Ct neck 11/10/2016 11/12/2016  . SBO (small bowel obstruction) ([place]) 11/11/2016  . Encounter for therapeutic drug monitoring 10/25/2013  . Diabetic peripheral neuropathy ([place]) 10/14/2011  . Hypotension 08/20/2011  . Acute renal failure ([place]) 08/20/2011  . OSA (obstructive sleep apnea) 04/15/2011  . ICD-[employer] 03/30/2011  . Chronic systolic CHF (congestive heart failure) ([place]) 03/30/2011  . PAF (paroxysmal atrial fibrillation) ([place]) 12/14/2010  . Nonischemic cardiomyopathy ([place]) 12/14/2010  . Type 2 diabetes mellitus ([place])  12/14/2010  . Hyperlipidemia 12/14/2010    Annia Friendly, PT 05/05/17 9:41 AM  [place], [place], [address] Phone: [phone]   Fax:  [phone]  Name: Collin FRUTIGER MRN: [id number] Date of Birth: 03-Mar-1941

## 2017-05-06 ENCOUNTER — Ambulatory Visit: Admission: RE | Admit: 2017-05-06 | Payer: Medicare HMO | Source: Ambulatory Visit | Admitting: Radiation Oncology

## 2017-05-06 DIAGNOSIS — C140 Malignant neoplasm of pharynx, unspecified: Secondary | ICD-10-CM | POA: Diagnosis not present

## 2017-05-06 DIAGNOSIS — I428 Other cardiomyopathies: Secondary | ICD-10-CM | POA: Diagnosis not present

## 2017-05-10 ENCOUNTER — Encounter: Payer: Self-pay | Admitting: *Deleted

## 2017-05-10 ENCOUNTER — Telehealth: Payer: Self-pay | Admitting: Physician Assistant

## 2017-05-10 ENCOUNTER — Telehealth: Payer: Self-pay | Admitting: *Deleted

## 2017-05-10 NOTE — Telephone Encounter (Signed)
Collin [name] is returning a call. Thanks

## 2017-05-10 NOTE — Telephone Encounter (Signed)
Lmtcb to go over lab and echo results. Have left several messages with no replies from the pt. I will mail out letter to the pt to ask for a call to go over test results. Results have also been sent to [place] as well. , though PA is wanting to know if edema is any better.

## 2017-05-10 NOTE — Telephone Encounter (Signed)
-----   Message from Richmond Campbell, LPN sent at 05/08/4096  1:06 PM EDT -----   ----- Message ----- From: Liliane Shi, PA-C Sent: 04/29/2017   7:23 AM To: [address] Triage  Please call the patient. The echocardiogram shows normal ejection fraction.  Continue current medications and follow up as planned.  Please route a copy of this study result to his PCP:  Eulas Post, MD  [name], PA-C    04/29/2017 7:21 AM

## 2017-05-10 NOTE — Telephone Encounter (Signed)
DPR ok to s/w pt's wife who has been notified of both echo and lab results by phone with verbal understanding. Pt's wife thanked me for the call.

## 2017-05-11 ENCOUNTER — Ambulatory Visit: Payer: Medicare HMO | Attending: Radiation Oncology | Admitting: Physical Therapy

## 2017-05-11 ENCOUNTER — Ambulatory Visit (INDEPENDENT_AMBULATORY_CARE_PROVIDER_SITE_OTHER): Payer: Medicare HMO | Admitting: General Practice

## 2017-05-11 DIAGNOSIS — I480 Paroxysmal atrial fibrillation: Secondary | ICD-10-CM

## 2017-05-11 DIAGNOSIS — L599 Disorder of the skin and subcutaneous tissue related to radiation, unspecified: Secondary | ICD-10-CM | POA: Insufficient documentation

## 2017-05-11 DIAGNOSIS — M6281 Muscle weakness (generalized): Secondary | ICD-10-CM | POA: Insufficient documentation

## 2017-05-11 DIAGNOSIS — Z5181 Encounter for therapeutic drug level monitoring: Secondary | ICD-10-CM | POA: Diagnosis not present

## 2017-05-11 DIAGNOSIS — R293 Abnormal posture: Secondary | ICD-10-CM | POA: Diagnosis not present

## 2017-05-11 DIAGNOSIS — I890 Lymphedema, not elsewhere classified: Secondary | ICD-10-CM | POA: Insufficient documentation

## 2017-05-11 LAB — POCT INR: INR: 2.9

## 2017-05-11 NOTE — Therapy (Signed)
[facility] [address] [place], [place], [address] Phone: [phone]   Fax:  [phone]  Physical Therapy Treatment  Patient Details  Name: Collin [name] MRN: [id number] Date of Birth: 1941-02-01 Referring Provider: Dr. Lonie Peak  Encounter Date: 05/11/2017      PT End of Session - 05/11/17 1209    Visit Number 3   Number of Visits 9   PT Start Time 0847   PT Stop Time 0935   PT Time Calculation (min) 48 min   Activity Tolerance Patient tolerated treatment well   Behavior During Therapy [facility] for tasks assessed/performed      Past Medical History:  Diagnosis Date  . Atrial fibrillation (HCC)   . Cardiomyopathy, nonischemic (HCC) 1998  . Diabetes mellitus   . Heart disease   . History of echocardiogram    Echo 8/18:  EF 55-60, no RWMA, severe LAE, normal RVSF, mild RAE  . History of radiation therapy 12/07/16- 01/24/17   Base of Tongue 70 Gy in 35 fractions  . Hyperlipidemia   . OSA (obstructive sleep apnea)   . Shingles   . 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 It sometimes shows as a shadow on scans.     Past Surgical History:  Procedure Laterality Date  . CARDIAC DEFIBRILLATOR PLACEMENT  2006  . CATARACT EXTRACTION Bilateral    01/07/2016. 01/22/2016  . CYST EXCISION     multiple drainage to a cyst in his neck.   Marland Kitchen DIRECT LARYNGOSCOPY N/A 11/16/2016   Procedure: DIRECT LARYNGOSCOPY AND BIOPSY;  Surgeon: [name], MD;  Location: [facility] OR;  Service: ENT;  Laterality: N/A;  . HERNIA REPAIR     1950's  . IMPLANTABLE CARDIOVERTER DEFIBRILLATOR (ICD) GENERATOR CHANGE N/A 05/10/2014   Procedure: ICD GENERATOR CHANGE;  Surgeon: [name], MD;  Location: [facility] CATH LAB;  Service: Cardiovascular;  Laterality: N/A;  . IR GASTROSTOMY TUBE MOD SED  12/27/2016  . IR PATIENT EVAL TECH 0-60 MINS  01/17/2017  . MASS EXCISION Right 11/16/2016   Procedure:  EXCISION RIGHT NECK LIPOMA;  Surgeon: [name], MD;  Location: [facility] OR;  Service: ENT;  Laterality: Right;  . MOHS SURGERY  01/01/2015   to top of head  . MOUTH SURGERY     teeth removed 4 teeth extracted 02/2010    There were no vitals filed for this visit.      Subjective Assessment - 05/11/17 0850    Subjective Feet look a little better.  Got compression socks that do fit, but are hard to get on.  Wife is able to do it with rubber gloves.  Blood tests have come back good.  Will have PET soon.  Had a hard time keeping the chip pack , but had it on a little bit.  Hard to know if it helped or not.   Currently in Pain? No/denies                         [facility] Adult PT Treatment/Exercise - 05/11/17 0001      Self-Care   Other Self-Care Comments  Tried (with washcloths for cleanliness) a manufactured compression garment that we have here, but patient found it too claustrophobia-producing to wear.  Instead, tried foam rectangle in stockinette on his upper neck, kept in place with 10 cm. short stretch bandage, but patient felt  claustrophobic with this as well.  Patient's wife was given these materials and patient was asked to try to use it any length of time that he thought he could tolerate it, to see if it helped.  This was in part to try a garment that covered the upper neck up under his chin but not the front of the neck where he felt like it was choking him; if that is tolerable, he could order a garment that just compressed him there.     Manual Therapy   Manual Lymphatic Drainage (MLD) Instructed patient's wife in the following and had her perform portions of this (but not all of it yet): Manual lymph drainage for face and neck in supine with HOB elevated including diaphragmatic breathing, supraclavicular fossae, bilat. axillae, bilat. shoulder collectors; posterolateral, lateral, anterolateral, and anterior neck; chin, cheeks and preauricular areas; then retraced all  steps toward axillae.                PT Education - 05/11/17 1208    Education provided Yes   Education Details manual lymph drainage   Person(s) Educated Spouse   Methods Explanation;Demonstration;Tactile cues;Verbal cues   Comprehension Verbalized understanding;Returned demonstration;Need further instruction  returned just partial demonstration                Long Term Clinic Goals - 05/11/17 1213      CC Long Term Goal  #1   Title Patient's wife will be knowledgeable about performing manual lymph drainage.   Status Partially Met     CC Long Term Goal  #2   Title Pt. and his wife will be knowledgeable about where and how to obtain a manufactured compression garment.   Status On-going     CC Long Term Goal  #3   Title Neck circumference at 8 cm. superior to sternal notch will reduce to 53 cm. or less   Status On-going         Head and Neck Clinic Goals - 12/14/16 1756      Patient will be able to verbalize understanding of a home exercise program for cervical range of motion, posture, and walking.    Status Achieved     Patient will be able to verbalize understanding of proper sitting and standing posture.    Status Achieved     Patient will be able to verbalize understanding of lymphedema risk and availability of treatment for this condition.    Status Achieved           Plan - 05/11/17 1209    Clinical Impression Statement Patient with difficulty tolerating compression on his neck due to claustrophobia. He was asked to try using 1/2 inch gray foam rectangle under his chin kept in place with a 10 cm. short stretch bandage at home if he could, for whatever length of time he could.  Patient's wife did well beginning to learn manual lymph drainage today.  She probably will finish instruction next session.  They did get compression stockings, which he is not wearing today, but his feet do look less swollen.   Rehab Potential Good    Clinical Impairments Affecting Rehab Potential claustrophobia, including with compression garments   PT Frequency 2x / week   PT Duration 4 weeks   PT Treatment/Interventions ADLs/Self Care Home Management;DME Instruction;Therapeutic exercise;Patient/family education;Orthotic Fit/Training;Manual techniques;Manual lymph drainage;Compression bandaging;Passive range of motion   PT Next Visit Plan Review MLD with patient's wife, have her perform it and give handout; see if  he tolerated using compression at all on his neck; begin recumbant bike, postural exercises, seated exercises to promote improved function.   PT Home Exercise Plan neck ROM, walking, MLD for neck   Family Member Consulted Wife [name]      Patient will benefit from skilled therapeutic intervention in order to improve the following deficits and impairments:  Increased edema, Decreased range of motion, Decreased knowledge of precautions, Decreased knowledge of use of DME, Postural dysfunction  Visit Diagnosis: Lymphedema, not elsewhere classified     Problem List Patient Active Problem List   Diagnosis Date Noted  . 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 Thrush, oral 12/21/2016  . Weight loss, abnormal 12/21/2016  . 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 Umbilical hernia without obstruction and without gangrene   . Pressure injury of skin 11/14/2016  . Tongue mass: Per Ct neck 11/10/2016 11/12/2016  . SBO (small bowel obstruction) (HCC) 11/11/2016  . Encounter for therapeutic drug monitoring 10/25/2013  . Diabetic peripheral neuropathy (HCC) 10/14/2011  . Hypotension 08/20/2011  . Acute renal failure (HCC) 08/20/2011  . OSA (obstructive sleep apnea) 04/15/2011  . ICD-[employer] 03/30/2011  . Chronic systolic CHF (congestive heart failure) (HCC) 03/30/2011  . PAF (paroxysmal atrial fibrillation) (HCC) 12/14/2010  . Nonischemic cardiomyopathy (HCC) 12/14/2010  . Type  2 diabetes mellitus (HCC) 12/14/2010  . Hyperlipidemia 12/14/2010    Collin [name] 05/11/2017, 12:14 PM  [facility] [address] [place], [place], [address] Phone: [phone]   Fax:  [phone]  Name: Collin [name] MRN: [id number] Date of Birth: Oct 30, 1940  Micheline Maze, PT 05/11/17 12:14 PM

## 2017-05-11 NOTE — Patient Instructions (Signed)
Pre visit review using our clinic review tool, if applicable. No additional management support is needed unless otherwise documented below in the visit note. 

## 2017-05-12 NOTE — Progress Notes (Signed)
[name] presents for follow up of radiation completed 01/24/17 to his Base of Tongue.   Pain issues, if any: He denies Using a feeding tube?: Yes, He is instilling 6 Osmolite daily and promod twice daily.  Weight changes, if any:  Wt Readings from Last 3 Encounters:  05/20/17 232 lb 3.2 oz (105.3 kg)  05/03/17 234 lb 9.6 oz (106.4 kg)  04/25/17 236 lb 1.9 oz (107.1 kg)   Swallowing issues, if any: He has tried to eat solids on occasion. He reports decreased appetite and little desire to eat. He is drinking/ sipping some liquids. He does have some nausea and vomiting with eating, and will also choke after eating.  Smoking or chewing tobacco? No Using fluoride trays daily? He is using prevident toothpaste.  Last ENT visit was on: Dr. Lucia Gaskins Other notable issues, if any:  His taste has improved.  PET 05/17/17  BP (!) 97/58   Pulse 69   Temp 98.4 F (36.9 C)   Ht 6' (1.829 m)   Wt 232 lb 3.2 oz (105.3 kg)   SpO2 97% Comment: room air  BMI 31.49 kg/m

## 2017-05-17 ENCOUNTER — Ambulatory Visit (HOSPITAL_COMMUNITY)
Admission: RE | Admit: 2017-05-17 | Discharge: 2017-05-17 | Disposition: A | Discharge status: Home / Self Care | Payer: Medicare HMO | Source: Ambulatory Visit | Attending: Radiation Oncology | Admitting: Radiation Oncology

## 2017-05-17 ENCOUNTER — Other Ambulatory Visit: Payer: Self-pay | Admitting: Family Medicine

## 2017-05-17 ENCOUNTER — Other Ambulatory Visit: Payer: Self-pay | Admitting: General Practice

## 2017-05-17 DIAGNOSIS — I700 Atherosclerosis of aorta: Secondary | ICD-10-CM | POA: Diagnosis not present

## 2017-05-17 DIAGNOSIS — C770 Secondary and unspecified malignant neoplasm of lymph nodes of head, face and neck: Secondary | ICD-10-CM | POA: Insufficient documentation

## 2017-05-17 DIAGNOSIS — I2510 Atherosclerotic heart disease of native coronary artery without angina pectoris: Secondary | ICD-10-CM | POA: Diagnosis not present

## 2017-05-17 DIAGNOSIS — N400 Enlarged prostate without lower urinary tract symptoms: Secondary | ICD-10-CM | POA: Insufficient documentation

## 2017-05-17 DIAGNOSIS — I289 Disease of pulmonary vessels, unspecified: Secondary | ICD-10-CM | POA: Diagnosis not present

## 2017-05-17 DIAGNOSIS — C01 Malignant neoplasm of base of tongue: Secondary | ICD-10-CM | POA: Diagnosis not present

## 2017-05-17 DIAGNOSIS — K8020 Calculus of gallbladder without cholecystitis without obstruction: Secondary | ICD-10-CM | POA: Insufficient documentation

## 2017-05-17 DIAGNOSIS — N62 Hypertrophy of breast: Secondary | ICD-10-CM | POA: Insufficient documentation

## 2017-05-17 DIAGNOSIS — N320 Bladder-neck obstruction: Secondary | ICD-10-CM | POA: Diagnosis not present

## 2017-05-17 DIAGNOSIS — I517 Cardiomegaly: Secondary | ICD-10-CM | POA: Diagnosis not present

## 2017-05-17 LAB — GLUCOSE, CAPILLARY: Glucose-Capillary: 115 mg/dL — ABNORMAL HIGH (ref 65–99)

## 2017-05-17 MED ORDER — FLUDEOXYGLUCOSE F - 18 (FDG) INJECTION
11.7300 | Freq: Once | INTRAVENOUS | Status: AC | PRN
  Administered 2017-05-17: 11.73 via INTRAVENOUS

## 2017-05-17 MED ORDER — WARFARIN SODIUM 5 MG PO TABS: ORAL_TABLET | ORAL | 1 refills | Status: DC

## 2017-05-19 ENCOUNTER — Ambulatory Visit: Payer: Medicare HMO | Admitting: Physical Therapy

## 2017-05-19 ENCOUNTER — Encounter: Payer: Self-pay | Admitting: Physical Therapy

## 2017-05-19 DIAGNOSIS — M6281 Muscle weakness (generalized): Secondary | ICD-10-CM | POA: Diagnosis not present

## 2017-05-19 DIAGNOSIS — L599 Disorder of the skin and subcutaneous tissue related to radiation, unspecified: Secondary | ICD-10-CM | POA: Diagnosis not present

## 2017-05-19 DIAGNOSIS — R293 Abnormal posture: Secondary | ICD-10-CM | POA: Diagnosis not present

## 2017-05-19 DIAGNOSIS — I890 Lymphedema, not elsewhere classified: Secondary | ICD-10-CM | POA: Diagnosis not present

## 2017-05-19 NOTE — Therapy (Signed)
small bowel obstruction) ([place]) 11/11/2016  . Encounter for therapeutic drug monitoring 10/25/2013  . Diabetic peripheral neuropathy ([place]) 10/14/2011  . Hypotension 08/20/2011  . Acute renal failure ([place]) 08/20/2011  . OSA (obstructive sleep apnea) 04/15/2011  . ICD-[employer] 03/30/2011  . Chronic systolic CHF (congestive heart failure) ([place]) 03/30/2011  . PAF (paroxysmal atrial fibrillation) ([place]) 12/14/2010  . Nonischemic cardiomyopathy ([place]) 12/14/2010  . Type 2 diabetes mellitus ([place]) 12/14/2010  . Hyperlipidemia 12/14/2010    Allyson Sabal [facility] 05/19/2017, 5:25 PM  [place], [place], [address] Phone: [phone]   Fax:  [phone]  Name: MANLEY FASON MRN: [id number] Date of Birth: 1940/09/12  Manus Gunning, PT 05/19/17 5:26 PM  [place], [place], [address] Phone: [phone]   Fax:  [phone]  Physical Therapy Treatment  Patient Details  Name: Collin [name] MRN: [id number] Date of Birth: 10/02/1940 Referring Provider: Dr. Eppie Gibson  Encounter Date: 05/19/2017      PT End of Session - 05/19/17 1720    Visit Number 4   Number of Visits 9   PT Start Time 7680  pt arrived late   PT Stop Time 1522   PT Time Calculation (min) 41 min   Activity Tolerance Patient tolerated treatment well   Behavior During Therapy [facility] for tasks assessed/performed      Past Medical History:  Diagnosis Date  . Atrial fibrillation ([place])   . Cardiomyopathy, nonischemic ([place]) 1998  . Diabetes mellitus   . Heart disease   . History of echocardiogram    Echo 8/18:  EF 55-60, no RWMA, severe LAE, normal RVSF, mild RAE  . History of radiation therapy 12/07/16- 01/24/17   Base of Tongue 70 Gy in 35 fractions  . Hyperlipidemia   . OSA (obstructive sleep apnea)   . Shingles   . 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 It sometimes shows as a shadow on scans.     Past Surgical History:  Procedure Laterality Date  . CARDIAC DEFIBRILLATOR PLACEMENT  2006  . CATARACT EXTRACTION Bilateral    01/07/2016. 01/22/2016  . CYST EXCISION     multiple drainage to a cyst in his neck.   Marland Kitchen DIRECT LARYNGOSCOPY N/A 11/16/2016   Procedure: DIRECT LARYNGOSCOPY AND BIOPSY;  Surgeon: Rozetta Nunnery, MD;  Location: [place];  Service: ENT;  Laterality: N/A;  . HERNIA REPAIR     1950's  . IMPLANTABLE CARDIOVERTER DEFIBRILLATOR (ICD) GENERATOR CHANGE N/A 05/10/2014   Procedure: ICD GENERATOR CHANGE;  Surgeon: [name], MD;  Location: [facility] CATH LAB;  Service: Cardiovascular;  Laterality: N/A;  . IR GASTROSTOMY TUBE MOD SED  12/27/2016  . IR PATIENT EVAL TECH 0-60 MINS  01/17/2017  . MASS EXCISION Right  11/16/2016   Procedure: EXCISION RIGHT NECK LIPOMA;  Surgeon: Rozetta Nunnery, MD;  Location: [place];  Service: ENT;  Laterality: Right;  . MOHS SURGERY  01/01/2015   to top of head  . MOUTH SURGERY     teeth removed 4 teeth extracted 02/2010    There were no vitals filed for this visit.      Subjective Assessment - 05/19/17 1442    Subjective I have been using the Biotene which has been helping with the dry mouth. I feel like my swelling is about the same as it was last week. None of the compression garments work because he is claustrophobic.    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 Patient underwent a PET scan on 11/22/16. This showed hypermetabolic base of tongue mass compatible with known neoplasm. 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  He will lhave XRT only, due to comorbidities of renal failure, type 2 DM, cardiac history, started 12/07/16. Treatment was finished 01/24/17.   Patient Stated Goals get help with swelling   Currently in Pain? No/denies  small bowel obstruction) ([place]) 11/11/2016  . Encounter for therapeutic drug monitoring 10/25/2013  . Diabetic peripheral neuropathy ([place]) 10/14/2011  . Hypotension 08/20/2011  . Acute renal failure ([place]) 08/20/2011  . OSA (obstructive sleep apnea) 04/15/2011  . ICD-[employer] 03/30/2011  . Chronic systolic CHF (congestive heart failure) ([place]) 03/30/2011  . PAF (paroxysmal atrial fibrillation) ([place]) 12/14/2010  . Nonischemic cardiomyopathy ([place]) 12/14/2010  . Type 2 diabetes mellitus ([place]) 12/14/2010  . Hyperlipidemia 12/14/2010    Allyson Sabal [facility] 05/19/2017, 5:25 PM  [place], [place], [address] Phone: [phone]   Fax:  [phone]  Name: MANLEY FASON MRN: [id number] Date of Birth: 1940/09/12  Manus Gunning, PT 05/19/17 5:26 PM

## 2017-05-20 ENCOUNTER — Ambulatory Visit
Admission: RE | Admit: 2017-05-20 | Discharge: 2017-05-20 | Disposition: A | Discharge status: Home / Self Care | Payer: Medicare HMO | Source: Ambulatory Visit | Attending: Radiation Oncology | Admitting: Radiation Oncology

## 2017-05-20 ENCOUNTER — Encounter: Payer: Self-pay | Admitting: *Deleted

## 2017-05-20 ENCOUNTER — Encounter: Payer: Self-pay | Admitting: Radiation Oncology

## 2017-05-20 VITALS — BP 97/58 | HR 69 | Temp 98.4°F | Ht 72.0 in | Wt 232.2 lb

## 2017-05-20 DIAGNOSIS — Z7984 Long term (current) use of oral hypoglycemic drugs: Secondary | ICD-10-CM | POA: Diagnosis not present

## 2017-05-20 DIAGNOSIS — R221 Localized swelling, mass and lump, neck: Secondary | ICD-10-CM | POA: Diagnosis not present

## 2017-05-20 DIAGNOSIS — C01 Malignant neoplasm of base of tongue: Secondary | ICD-10-CM | POA: Diagnosis not present

## 2017-05-20 DIAGNOSIS — R600 Localized edema: Secondary | ICD-10-CM | POA: Diagnosis not present

## 2017-05-20 DIAGNOSIS — Z7901 Long term (current) use of anticoagulants: Secondary | ICD-10-CM | POA: Insufficient documentation

## 2017-05-20 DIAGNOSIS — I890 Lymphedema, not elsewhere classified: Secondary | ICD-10-CM | POA: Diagnosis not present

## 2017-05-20 DIAGNOSIS — Z931 Gastrostomy status: Secondary | ICD-10-CM | POA: Insufficient documentation

## 2017-05-20 DIAGNOSIS — R05 Cough: Secondary | ICD-10-CM | POA: Diagnosis not present

## 2017-05-20 DIAGNOSIS — Z79899 Other long term (current) drug therapy: Secondary | ICD-10-CM | POA: Insufficient documentation

## 2017-05-20 DIAGNOSIS — Z08 Encounter for follow-up examination after completed treatment for malignant neoplasm: Secondary | ICD-10-CM | POA: Diagnosis not present

## 2017-05-20 DIAGNOSIS — Z85810 Personal history of malignant neoplasm of tongue: Secondary | ICD-10-CM | POA: Diagnosis not present
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
## 2017-05-20 NOTE — Progress Notes (Addendum)
Radiation Oncology         [phone] ________________________________  Name: Collin [name] MRN: [id number]  Date: 05/20/2017  DOB: 1941-01-25  Follow-Up Visit Note  CC: [name], MD  [name], *  Diagnosis and Prior Radiotherapy:       ICD-10-CM   1. 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   Pain issues, if any: He denies. Using a feeding tube?: Yes, he is instilling 6 Osmolite daily and promod twice daily.  Weight changes, if any:  Wt Readings from Last 3 Encounters:  05/20/17 232 lb 3.2 oz (105.3 kg)  05/03/17 234 lb 9.6 oz (106.4 kg)  04/25/17 236 lb 1.9 oz (107.1 kg)   Swallowing issues, if any: He has tried to eat solids on occasion. He reports decreased appetite and little desire to eat. He is drinking/sipping some liquids. He does have some nausea and vomiting with eating and will also choke after eating.  Smoking or chewing tobacco? No. Using fluoride trays daily? He is using prevident toothpaste.  Last ENT visit was on: ? He has an appointment scheduled with Dr. Ezzard Standing on 06/03/2017. Other notable issues, if any: His taste has improved. PET scan on 05/17/2017 showed a good response to therapy with decreased hypermetabolism within tongue base primary and cervical nodal metastasis. Persistent masses in neck, much decreased SUV uptake - could be scar tissue.  No evidence of extracervical metastatic disease. Scan also showed possible issues including cirrhosis, pulmonary HTN, and coronary artery atherosclerosis. I have  personally reviewed the patient's imaging   ALLERGIES:  is allergic to niaspan [niacin er].  Meds: Current Outpatient Prescriptions  Medication Sig Dispense Refill  . acetaminophen (TYLENOL) 500 MG tablet Take 1,000 mg by mouth every 6 (six) hours as needed for moderate pain.    . carvedilol (COREG) 25 MG tablet TAKE 1 TABLET TWICE DAILY WITH MEALS 180 tablet 1  . diltiazem (CARDIZEM) 90 MG tablet Take 1 tablet (90 mg total) by mouth daily. 30 tablet 11  . furosemide (LASIX) 20 MG tablet Take 20 mg by mouth daily. THEN AN ADDITIONAL TABLET BY MOUTH IF NEEDED EVERY THIRD DAY    . Nutritional Supplements (FEEDING SUPPLEMENT, OSMOLITE 1.5 CAL,) LIQD Give 2 cans of formula at 8am and 12 noon. Give additional 1 1/2 cans at 4pm and 8pm feeding.   Flush with 60ml of water before and after feeding 4 times per day 1659 mL 0  . Nutritional Supplements (PROMOD) LIQD Give 30ml (1 oz) 2 times per day via tube. Flush with 60ml of water before and after given. 946 mL   . polyethylene glycol (MIRALAX) packet Take 17 g by mouth daily as needed. Available OTC 30 each 0  . potassium chloride (K-DUR) 10 MEQ tablet Dissolve 1 tablet in water and administer via PEG tube daily 90 tablet 3  . sodium fluoride (PREVIDENT 5000 PLUS) 1.1 % CREA dental cream Apply gel to tooth brush. Brush teeth for 2 minutes. Spit out excess-DO NOT swallow. Repeat nightly. 1 Tube prn  . warfarin (COUMADIN) 5  MG tablet TAKE AS DIRECTED BY ANTICOAGULATION CLINIC. 105 tablet 1   No current facility-administered medications for this encounter.    Review of Systems: As above  Physical Findings: Wt Readings from Last 3 Encounters:  05/20/17 232 lb 3.2 oz (105.3 kg)  05/03/17 234 lb 9.6 oz (106.4 kg)  04/25/17 236 lb 1.9 oz (107.1 kg)    height is 6' (1.829 m) and weight is 232 lb 3.2 oz (105.3 kg). His temperature is 98.4 F (36.9 C). His blood pressure is 97/58 (abnormal) and his pulse is 69. His oxygen saturation is 97%. .  General:  Alert and oriented, in no acute distress. HEENT: Head is normocephalic. Oropharynx is clear. Moist mucosa. No nodularity in the palpable portions of base of his tongue. Neck: He has lymphedema in the anterior neck and persistent bulky masses in the level II/III region of the right neck. Dominant mass is about 4-5 cm in greatest dimension.  Heart: Regular in rate and rhythm    Chest: Clear to auscultation bilaterally, with no rhonchi, wheezes, or rales. Abdomen: Soft, nontender, nondistended, with no rigidity or guarding. Abdominal tube is intact. Lymphatics: see Neck Exam Skin: He still has some hypopigmentation in patches over the skin of his neck.   Psychiatric: Judgment and insight are intact. Affect is appropriate.    Lab Findings: Lab Results  Component Value Date   WBC 4.3 12/27/2016   HGB 13.3 12/27/2016   HCT 39.8 12/27/2016   MCV 86.3 12/27/2016   PLT 227 12/27/2016    Lab Results  Component Value Date   TSH 1.360 03/11/2017    Radiographic Findings: Nm Pet Image Restag (ps) Skull Base To Thigh  Result Date: 05/17/2017 CLINICAL DATA:  Subsequent treatment strategy for restaging of neoplasm of base of tongue. EXAM: NUCLEAR MEDICINE PET SKULL BASE TO THIGH TECHNIQUE: 11.7 mCi F-18 FDG was injected intravenously. Full-ring PET imaging was performed from the skull base to thigh after the radiotracer. CT data was obtained and used for attenuation correction and anatomic localization. FASTING BLOOD GLUCOSE:  Value: 115 mg/dl COMPARISON:  72/53/6644 FINDINGS: NECK: Right paracentral tongue base hypermetabolism measures a S.U.V. max of 3.4 versus a S.U.V. max of 11.5 on the prior exam. Residual right-sided level 2 nodes. An index node measures 3.5 cm and a S.U.V. max of 2.5 on image 41/series 4. Compare 4.1 cm and a S.U.V. max of 6.9 on the prior exam. More posterior level 2 node measures 2.5 cm and a S.U.V. max of 2.5 today. Compare similar in size and a S.U.V. max of 10.1 on the  prior. A focus of hypermetabolism about the posterior left true cord is without CT correlate and may be physiologic or due to phonation. A left-sided level 2 node measures 10 mm on image 37/ series 4 versus to 21 mm on the prior exam (when remeasured). No residual hypermetabolism. Bilateral carotid atherosclerosis. CHEST: No pulmonary parenchymal or thoracic nodal hypermetabolism identified. Cardiomegaly with pacer. Enlargement of the azygous vein, suggesting elevated heart pressure. Pulmonary artery enlargement, 3.5 cm outflow tract. Coronary artery atherosclerosis. Aortic atherosclerosis. Bilateral gynecomastia is marked. ABDOMEN/PELVIS: Heterogeneous hepatic activity, without well-defined dominant hypermetabolic lesion. Otherwise, no abdominopelvic parenchymal hypermetabolism. No nodal hypermetabolism. Caudate lobe enlargement. Mild bilateral adrenal thickening. Right renal vascular calcification. Interpolar right renal cyst. Abdominal aortic atherosclerosis. Gallbladder neck stone of 2.1 cm. The gastrostomy tube. Colonic stool burden suggests constipation. Fat containing ventral pelvic wall hernia. Mild prostatomegaly with bladder wall thickening and a right-sided bladder diverticulum which  is tiny. SKELETON: No abnormal marrow activity. No suspicious focal osseous lesion. IMPRESSION: 1. Response to therapy. 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 2. No evidence of extracervical metastatic disease. 3. Heterogeneous hepatic activity is nonspecific. Concurrent caudate lobe enlargement and gynecomastia. Correlate with risk factors for cirrhosis. Consider dedicated abdominal imaging, ideally with pre and post contrast abdominal MRI. 4. Cardiomegaly with suspicion of elevated right heart pressures. Pulmonary artery enlargement suggests pulmonary arterial hypertension. 5. Coronary artery atherosclerosis. Aortic Atherosclerosis (ICD10-I70.0). 6. Cholelithiasis. 7.  Prostatomegaly with bladder outlet obstruction. 8. Fat containing ventral pelvic wall hernia. 9. Pulmonary artery enlargement suggests pulmonary arterial hypertension. Electronically Signed   By: Jeronimo Greaves M.D.   On: 05/17/2017 16:15    Impression/Plan:    1) Head and Neck Cancer Status: The patient appears to be with excellent response on PET yet he has residual masses that may warrant biopsy or removal. We will discuss strategy at tumor board on 05/25/2017.  Sometimes we watch these masses closely if the SUV uptake has resolved.  2) Nutritional Status: Weight stable in the 230s. PEG tube: Yes, use prn.  3) Swallowing: His swallowing has improved. Continue to try eating solid foods.  4) Dental: Encouraged to continue regular followup with dentistry, and dental hygiene including fluoride rinses.  5) Thyroid function: WNL. Continue routine TSH labs. Lab Results  Component Value Date   TSH 1.360 03/11/2017    6) Other: We discussed the results of the patient's PET scan including those unrelated to his cancer. I will make a referral to his PCP, Dr. Caryl Never, to take place in about one month to address these issues. I gave him a copy of the PET report to pt to discuss with Dr. Caryl Never - I will defer to his PCP to address his co morbidities as appropriate. . In addition, I previously referred the patient back to general surgery,  for his abdominal hernia at his last follow-up. However, the patient's wife explains that they are not financially ready to address this issue yet. I encouraged them to let me know if they need another referral.  7)) The patient is scheduled to follow up with Dr. Ezzard Standing on 06/03/2017. Follow-up with me in 3 months. ENT tumor board review next week. The patient was encouraged to call with any issues or questions before then.  8) Neck  lymphedema - refer to PT for management   I spent 25 minutes face to face with the patient and more than 50% of that time was spent  in counseling and/or coordination of care. _____________________________________   Lonie Peak, MD  This document serves as a record of services personally performed by Lonie Peak, MD. It was created on her behalf by Ivar Bury, a trained medical scribe. The creation of this record is based on the scribe's personal observations and the provider's statements to them. This document has been checked and approved by the attending provider.

## 2017-05-22 ENCOUNTER — Other Ambulatory Visit: Payer: Self-pay | Admitting: Radiation Oncology

## 2017-05-22 DIAGNOSIS — C01 Malignant neoplasm of base of tongue: Secondary | ICD-10-CM

## 2017-05-23 ENCOUNTER — Telehealth: Payer: Self-pay | Admitting: Family Medicine

## 2017-05-23 NOTE — Telephone Encounter (Signed)
Pt has been scheduled.  °

## 2017-05-23 NOTE — Telephone Encounter (Addendum)
Left message on voice mail to call back.  Pt needs to make a 30 min fup

## 2017-05-24 ENCOUNTER — Ambulatory Visit: Payer: Medicare HMO | Admitting: Physical Therapy

## 2017-05-24 DIAGNOSIS — R293 Abnormal posture: Secondary | ICD-10-CM | POA: Diagnosis not present

## 2017-05-24 DIAGNOSIS — L599 Disorder of the skin and subcutaneous tissue related to radiation, unspecified: Secondary | ICD-10-CM

## 2017-05-24 DIAGNOSIS — I890 Lymphedema, not elsewhere classified: Secondary | ICD-10-CM | POA: Diagnosis not present

## 2017-05-24 DIAGNOSIS — M6281 Muscle weakness (generalized): Secondary | ICD-10-CM | POA: Diagnosis not present

## 2017-05-24 NOTE — Therapy (Signed)
and Agree with Plan of Care Patient      Patient will benefit from skilled therapeutic intervention in order to improve the following deficits and impairments:  Increased edema, Decreased range of motion, Decreased knowledge of precautions, Decreased knowledge of use of DME, Postural dysfunction  Visit Diagnosis: Lymphedema, not elsewhere classified  Abnormal posture  Muscle weakness (generalized)  Disorder of the skin and subcutaneous tissue related to radiation, unspecified     Problem List Patient Active Problem List   Diagnosis Date Noted  . 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 Thrush, oral 12/21/2016  . Weight loss, abnormal 12/21/2016  . Malignant neoplasm of base of tongue ([place]) 11/24/2016  . Umbilical hernia without obstruction and without gangrene   . Pressure injury of skin 11/14/2016  . Tongue mass: Per Ct neck 11/10/2016 11/12/2016  . SBO (small bowel obstruction) ([place]) 11/11/2016  . Encounter for therapeutic drug monitoring 10/25/2013  . Diabetic peripheral neuropathy ([place]) 10/14/2011  . Hypotension 08/20/2011  . Acute renal failure ([place]) 08/20/2011  . OSA (obstructive sleep apnea) 04/15/2011  . ICD-[employer] 03/30/2011  . Chronic systolic CHF (congestive heart failure) ([place]) 03/30/2011  . PAF (paroxysmal atrial fibrillation) ([place]) 12/14/2010  . Nonischemic cardiomyopathy ([place]) 12/14/2010  . Type 2  diabetes mellitus ([place]) 12/14/2010  . Hyperlipidemia 12/14/2010    [name] 05/24/2017, 5:30 PM  [place], [place], [address] Phone: [phone]   Fax:  [phone]  Name: Collin [name] MRN: [id number] Date of Birth: 1940-11-23  Collin [name], PT 05/24/17 5:30 PM  [place], [place], [address] Phone: [phone]   Fax:  [phone]  Physical Therapy Treatment  Patient Details  Name: Collin [name] MRN: [id number] Date of Birth: March 09, 1941 Referring Provider: Dr. Eppie Gibson  Encounter Date: 05/24/2017      PT End of Session - 05/24/17 1726    Visit Number 5   Number of Visits 9   PT Start Time 1282   PT Stop Time 1432   PT Time Calculation (min) 47 min   Activity Tolerance Patient tolerated treatment well   Behavior During Therapy [facility] for tasks assessed/performed      Past Medical History:  Diagnosis Date  . Atrial fibrillation ([place])   . Cardiomyopathy, nonischemic ([place]) 1998  . Diabetes mellitus   . Heart disease   . History of echocardiogram    Echo 8/18:  EF 55-60, no RWMA, severe LAE, normal RVSF, mild RAE  . History of radiation therapy 12/07/16- 01/24/17   Base of Tongue 70 Gy in 35 fractions  . Hyperlipidemia   . OSA (obstructive sleep apnea)   . Shingles   . 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 It sometimes shows as a shadow on scans.     Past Surgical History:  Procedure Laterality Date  . CARDIAC DEFIBRILLATOR PLACEMENT  2006  . CATARACT EXTRACTION Bilateral    01/07/2016. 01/22/2016  . CYST EXCISION     multiple drainage to a cyst in his neck.   Marland Kitchen DIRECT LARYNGOSCOPY N/A 11/16/2016   Procedure: DIRECT LARYNGOSCOPY AND BIOPSY;  Surgeon: Rozetta Nunnery, MD;  Location: [facility];  Service: ENT;  Laterality: N/A;  . HERNIA REPAIR     1950's  . IMPLANTABLE CARDIOVERTER DEFIBRILLATOR (ICD) GENERATOR CHANGE N/A 05/10/2014   Procedure: ICD GENERATOR CHANGE;  Surgeon: [name], MD;  Location: [facility] CATH LAB;  Service: Cardiovascular;  Laterality: N/A;  . IR GASTROSTOMY TUBE MOD SED  12/27/2016  . IR PATIENT EVAL TECH 0-60 MINS  01/17/2017  . MASS EXCISION Right 11/16/2016   Procedure:  EXCISION RIGHT NECK LIPOMA;  Surgeon: Rozetta Nunnery, MD;  Location: [facility];  Service: ENT;  Laterality: Right;  . MOHS SURGERY  01/01/2015   to top of head  . MOUTH SURGERY     teeth removed 4 teeth extracted 02/2010    There were no vitals filed for this visit.      Subjective Assessment - 05/24/17 1348    Subjective Wife: "I wasn't feeling well this week and I didn't do his exercises.  My stomach still isn't happy." Pt.: "I've been able to eat some regular food through my throat." They're all just tiny portions. Pt.'s wife feels drained and so she just wants to watch therapist today. His PET scan was good so they don't want to see [place] for three months.   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 Patient underwent a PET scan on 11/22/16. This showed hypermetabolic base of tongue mass compatible with known neoplasm. 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  He will lhave XRT only, due to comorbidities of renal failure, type 2 DM, cardiac history, started 12/07/16. Treatment was finished 01/24/17.   Currently in  [place], [place], [address] Phone: [phone]   Fax:  [phone]  Physical Therapy Treatment  Patient Details  Name: Collin [name] MRN: [id number] Date of Birth: March 09, 1941 Referring Provider: Dr. Eppie Gibson  Encounter Date: 05/24/2017      PT End of Session - 05/24/17 1726    Visit Number 5   Number of Visits 9   PT Start Time 1282   PT Stop Time 1432   PT Time Calculation (min) 47 min   Activity Tolerance Patient tolerated treatment well   Behavior During Therapy [facility] for tasks assessed/performed      Past Medical History:  Diagnosis Date  . Atrial fibrillation ([place])   . Cardiomyopathy, nonischemic ([place]) 1998  . Diabetes mellitus   . Heart disease   . History of echocardiogram    Echo 8/18:  EF 55-60, no RWMA, severe LAE, normal RVSF, mild RAE  . History of radiation therapy 12/07/16- 01/24/17   Base of Tongue 70 Gy in 35 fractions  . Hyperlipidemia   . OSA (obstructive sleep apnea)   . Shingles   . 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 It sometimes shows as a shadow on scans.     Past Surgical History:  Procedure Laterality Date  . CARDIAC DEFIBRILLATOR PLACEMENT  2006  . CATARACT EXTRACTION Bilateral    01/07/2016. 01/22/2016  . CYST EXCISION     multiple drainage to a cyst in his neck.   Marland Kitchen DIRECT LARYNGOSCOPY N/A 11/16/2016   Procedure: DIRECT LARYNGOSCOPY AND BIOPSY;  Surgeon: Rozetta Nunnery, MD;  Location: [facility];  Service: ENT;  Laterality: N/A;  . HERNIA REPAIR     1950's  . IMPLANTABLE CARDIOVERTER DEFIBRILLATOR (ICD) GENERATOR CHANGE N/A 05/10/2014   Procedure: ICD GENERATOR CHANGE;  Surgeon: [name], MD;  Location: [facility] CATH LAB;  Service: Cardiovascular;  Laterality: N/A;  . IR GASTROSTOMY TUBE MOD SED  12/27/2016  . IR PATIENT EVAL TECH 0-60 MINS  01/17/2017  . MASS EXCISION Right 11/16/2016   Procedure:  EXCISION RIGHT NECK LIPOMA;  Surgeon: Rozetta Nunnery, MD;  Location: [facility];  Service: ENT;  Laterality: Right;  . MOHS SURGERY  01/01/2015   to top of head  . MOUTH SURGERY     teeth removed 4 teeth extracted 02/2010    There were no vitals filed for this visit.      Subjective Assessment - 05/24/17 1348    Subjective Wife: "I wasn't feeling well this week and I didn't do his exercises.  My stomach still isn't happy." Pt.: "I've been able to eat some regular food through my throat." They're all just tiny portions. Pt.'s wife feels drained and so she just wants to watch therapist today. His PET scan was good so they don't want to see [place] for three months.   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 Patient underwent a PET scan on 11/22/16. This showed hypermetabolic base of tongue mass compatible with known neoplasm. 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  He will lhave XRT only, due to comorbidities of renal failure, type 2 DM, cardiac history, started 12/07/16. Treatment was finished 01/24/17.   Currently in

## 2017-05-25 ENCOUNTER — Ambulatory Visit (INDEPENDENT_AMBULATORY_CARE_PROVIDER_SITE_OTHER): Payer: Medicare HMO | Admitting: Family Medicine

## 2017-05-25 ENCOUNTER — Encounter: Payer: Self-pay | Admitting: Family Medicine

## 2017-05-25 VITALS — BP 102/58 | HR 85 | Temp 98.2°F | Ht 72.0 in | Wt 232.0 lb

## 2017-05-25 DIAGNOSIS — E118 Type 2 diabetes mellitus with unspecified complications: Secondary | ICD-10-CM

## 2017-05-25 DIAGNOSIS — E785 Hyperlipidemia, unspecified: Secondary | ICD-10-CM | POA: Diagnosis not present

## 2017-05-25 DIAGNOSIS — Z23 Encounter for immunization: Secondary | ICD-10-CM

## 2017-05-25 DIAGNOSIS — K429 Umbilical hernia without obstruction or gangrene: Secondary | ICD-10-CM | POA: Diagnosis not present

## 2017-05-25 DIAGNOSIS — I428 Other cardiomyopathies: Secondary | ICD-10-CM

## 2017-05-25 DIAGNOSIS — C01 Malignant neoplasm of base of tongue: Secondary | ICD-10-CM

## 2017-05-25 DIAGNOSIS — H00014 Hordeolum externum left upper eyelid: Secondary | ICD-10-CM | POA: Diagnosis not present

## 2017-05-25 LAB — POCT GLYCOSYLATED HEMOGLOBIN (HGB A1C): Hemoglobin A1C: 6.3

## 2017-05-25 NOTE — Progress Notes (Signed)
Subjective:     Patient ID: Collin [name], male   DOB: 03/09/1941, 76 y.o.   MRN: [id number]  HPI Patient has multiple chronic problems including history of obesity, nonischemic cardiomyopathy, atrial fibrillation, obstructive sleep apnea, history of type 2 diabetes, chronic systolic heart failure, hyperlipidemia. History of malignant neoplasm base of tongue and had radiation with restaging PET scan done on 05/17/17. He had multiple abnormalities on that scan and was directed to follow-up here. Fortunately PET scan did show response to therapy with decreased hypermetabolism within the tongue base and cervical node metastasis. There was no evidence for extra cervical metastatic disease.  Several other abnormalities were noted including  -gallbladder neck stone 2.1 cm -Prostatomegaly -Ventral wall hernia -Heterogenous hepatic activity and caudate lobe enlargement and gynecomastia-with recommendation to correlate for risk factors for cirrhosis. 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 He has lost some weight since his diagnosis and has been on liquid nutrition for months and just this week started eating some soft foods.   He denies any nocturia or difficulties with slow urinary stream or hesitancy. He has known hernia but has not had any recent localizing pains.  He does not have any known history of cirrhosis. He is on chronic Coumadin and has elevated INR from that but recent albumin 3.8 and normal platelets. No history of alcohol abuse.Marland Kitchen No prior mention of fatty liver changes.  History of type 2 diabetes. Was taken off metformin with recent loss of weight. Last A1c 6.7% and down to 6.3% today off medication  He's had some edema mostly involving the feet. Currently on Lasix 60 mg daily per cardiology. .  Finally, patient has stye right upper eyelid. He has had these in the past. Current stye was noted  several days ago. Minimally painful. No visual changes.  Needs immunizations including flu vaccine and Prevnar 13  Past Medical History:  Diagnosis Date  . Atrial fibrillation (HCC)   . Cardiomyopathy, nonischemic (HCC) 1998  . Diabetes mellitus   . Heart disease   . History of echocardiogram    Echo 8/18:  EF 55-60, no RWMA, severe LAE, normal RVSF, mild RAE  . History of radiation therapy 12/07/16- 01/24/17   Base of Tongue 70 Gy in 35 fractions  . Hyperlipidemia   . OSA (obstructive sleep apnea)   . Shingles   . 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 It sometimes shows as a shadow on scans.    Past Surgical History:  Procedure Laterality Date  . CARDIAC DEFIBRILLATOR PLACEMENT  2006  . CATARACT EXTRACTION Bilateral    01/07/2016. 01/22/2016  . CYST EXCISION     multiple drainage to a cyst in his neck.   Marland Kitchen DIRECT LARYNGOSCOPY N/A 11/16/2016   Procedure: DIRECT LARYNGOSCOPY AND BIOPSY;  Surgeon: [name], MD;  Location: [facility] OR;  Service: ENT;  Laterality: N/A;  . HERNIA REPAIR     1950's  . IMPLANTABLE CARDIOVERTER DEFIBRILLATOR (ICD) GENERATOR CHANGE N/A 05/10/2014   Procedure: ICD GENERATOR CHANGE;  Surgeon: [name], MD;  Location: [facility] CATH LAB;  Service: Cardiovascular;  Laterality: N/A;  . IR GASTROSTOMY TUBE MOD SED  12/27/2016  . IR PATIENT EVAL TECH 0-60 MINS  01/17/2017  . MASS EXCISION Right 11/16/2016   Procedure: EXCISION RIGHT NECK LIPOMA;  Surgeon: [name], MD;  Location: [facility] OR;  Service: ENT;  Laterality: Right;  . MOHS SURGERY  01/01/2015   to top of head  . MOUTH SURGERY     teeth removed 4 teeth extracted 02/2010    reports that he has never smoked. He has never used smokeless tobacco. He reports that he drinks alcohol. He reports that he does not use drugs. family history includes Heart disease in his mother; Hypertension in his father; Lupus in his mother; Stroke in his father;  Sudden death in his mother. Allergies  Allergen Reactions  . Niaspan [Niacin Er] Other (See Comments)    REACTION NOT AN ALLERGY >  FLUSHED, EXPECTED W/NIACIN       Review of Systems  Constitutional: Negative for chills, fatigue and fever.  Eyes: Negative for visual disturbance.  Respiratory: Negative for cough, chest tightness and shortness of breath.   Cardiovascular: Negative for chest pain, palpitations and leg swelling.  Gastrointestinal: Negative for abdominal pain.  Endocrine: Negative for polydipsia and polyuria.  Genitourinary: Negative for dysuria.  Neurological: Negative for dizziness, syncope, weakness, light-headedness and headaches.       Objective:   Physical Exam  Constitutional: He is oriented to person, place, and time. He appears well-developed and well-nourished.  Eyes:  Small stye right upper lid.  Cardiovascular: Normal rate.   Pulmonary/Chest: Effort normal and breath sounds normal. No respiratory distress. He has no wheezes. He has no rales.  Abdominal: Soft. There is no tenderness.  Musculoskeletal: He exhibits edema.  Has some edema mostly confined to the feet but minimal edema involving the legs.  Neurological: He is alert and oriented to person, place, and time.  Psychiatric: He has a normal mood and affect. His behavior is normal.       Assessment:     #1 malignancy involving base of the tongue with previous radiation therapy and restaging PET recently as above  #2 type 2 diabetes currently controlled and improved with recent weight loss issues. Stable off Metformin with A1c today 6.3%  #3 history of nonischemic cardiomyopathy followed by cardiology  #4 history of cholelithiasis without current symptoms  #5 mention of increased prostate enlargement recently without clinically significant obstructive symptoms  #6 Stye involving right upper lid  #7 ventral wall hernia currently asymptomatic  #8 question of cirrhosis based on nonspecific  PET findings as above. He does not have any known history of cirrhosis and does not have any hypoalbuminemia, thrombocytopenia, or other suggestive features  #9 .health maintenance. Patient needs Prevnar 13 and influenza vaccine      Plan:     -Flu vaccine and Prevnar 13 given -A1c as above 6.3% -He has multiple findings as above and we have not recommended any intervention regarding his prostate or asymptomatic gallstone at this time. We reviewed signs and symptoms to watch out for as well as for hernia strangulation. -Warm compresses several times daily to stye and if not resolving in 2 weeks consider ophthalmology referral -Continue close follow-up with cardiology -follow up here in 3 months as sooner as needed -over 40 minutes spent with pt and wife of which > 50% in direct assessment/review of recent tests, education regarding PET scan findings, dietary counseling, and indicated prevention measures due at this time.    [name] MD [facility] at [facility]

## 2017-05-25 NOTE — Patient Instructions (Signed)
Use warm compresses to right eye several times daily Follow up for any abdominal pain, slow urine stream, fever, or other concerns.

## 2017-05-26 ENCOUNTER — Encounter: Payer: Self-pay | Admitting: Family Medicine

## 2017-05-27 ENCOUNTER — Ambulatory Visit: Payer: Medicare HMO | Admitting: Physical Therapy

## 2017-05-27 DIAGNOSIS — M6281 Muscle weakness (generalized): Secondary | ICD-10-CM | POA: Diagnosis not present

## 2017-05-27 DIAGNOSIS — R293 Abnormal posture: Secondary | ICD-10-CM

## 2017-05-27 DIAGNOSIS — I890 Lymphedema, not elsewhere classified: Secondary | ICD-10-CM | POA: Diagnosis not present

## 2017-05-27 DIAGNOSIS — L599 Disorder of the skin and subcutaneous tissue related to radiation, unspecified: Secondary | ICD-10-CM | POA: Diagnosis not present

## 2017-05-27 NOTE — Therapy (Signed)
[facility] Goals - 05/11/17 1213      CC Long Term Goal  #1   Title Patient's wife will be knowledgeable about performing manual lymph drainage.   Status Partially Met     CC Long Term Goal  #2   Title Pt. and his wife will be knowledgeable about where and how to obtain a manufactured compression garment.   Status On-going     CC Long Term Goal  #3   Title Neck circumference at 8 cm. superior to sternal notch will reduce to 53 cm. or less   Status On-going         [facility] Goals - 12/14/16 1756      Patient will be able to verbalize understanding of a home exercise program for cervical range of motion, posture, and walking.    Status Achieved     Patient will be able to verbalize understanding of proper sitting and standing posture.    Status Achieved     Patient will be able to  verbalize understanding of lymphedema risk and availability of treatment for this condition.    Status Achieved           Plan - 05/27/17 1201    Clinical Impression Statement Pt. was late for his session today, so had limited treatment.  We focused on exercise, and he did very well with this, especially tolerating 12 minutes on the NuStep without difficulty.  He did have mild dyspnea with other exercise.   Rehab Potential Good   Clinical Impairments Affecting Rehab Potential claustrophobia, including with compression garments   PT Frequency 2x / week   PT Duration 4 weeks   PT Treatment/Interventions ADLs/Self Care Home Management;DME Instruction;Therapeutic exercise;Patient/family education;Orthotic Fit/Training;Manual techniques;Manual lymph drainage;Compression bandaging;Passive range of motion   PT Next Visit Plan Focus more on exercise: NuStep (he can't do the bike), postural exercises, etc.; MLD prn   PT Home Exercise Plan neck ROM, walking, MLD for neck   Consulted and Agree with Plan of Care Patient      Patient will benefit from skilled therapeutic intervention in order to improve the following deficits and impairments:  Increased edema, Decreased range of motion, Decreased knowledge of precautions, Decreased knowledge of use of DME, Postural dysfunction  Visit Diagnosis: Abnormal posture  Muscle weakness (generalized)     Problem List Patient Active Problem List   Diagnosis Date Noted  . 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 Thrush, oral 12/21/2016  . Weight loss, abnormal 12/21/2016  . Malignant neoplasm of base of tongue ([place]) 11/24/2016  . Umbilical hernia without obstruction and without gangrene   . Pressure injury of skin 11/14/2016  . Tongue mass: Per Ct neck 11/10/2016 11/12/2016  . SBO (small bowel obstruction) ([place]) 11/11/2016  . Encounter for therapeutic drug monitoring 10/25/2013  . Diabetic peripheral neuropathy ([place])  10/14/2011  . Hypotension 08/20/2011  . Acute renal failure ([place]) 08/20/2011  . OSA (obstructive sleep apnea) 04/15/2011  . ICD-[employer] 03/30/2011  . Chronic systolic CHF (congestive heart failure) ([place]) 03/30/2011  . PAF (paroxysmal atrial fibrillation) ([place]) 12/14/2010  . Nonischemic cardiomyopathy ([place]) 12/14/2010  . Type 2 diabetes mellitus ([place]) 12/14/2010  . Hyperlipidemia 12/14/2010    [name],Collin 05/27/2017, 12:03 PM  [place], [place], [address] Phone: [phone]   Fax:  [phone]  Name: Collin [name] MRN: [id number] Date of Birth: July 09, 1941  Collin [name], PT 05/27/17 12:03 PM  [place], [place], [address] Phone: [phone]   Fax:  [phone]  Physical Therapy Treatment  Patient Details  Name: Collin [name] MRN: [id number] Date of Birth: Sep 12, 1940 Referring Provider: Dr. Eppie [name]  Encounter Date: 05/27/2017      PT End of Session - 05/27/17 1200    Visit Number 6   Number of Visits 9   PT Start Time 0315  pt. late for session today   PT Stop Time 1103   PT Time Calculation (min) 33 min   Activity Tolerance Patient tolerated treatment well   Behavior During Therapy [facility] for tasks assessed/performed      Past Medical History:  Diagnosis Date  . Atrial fibrillation ([place])   . Cardiomyopathy, nonischemic ([place]) 1998  . Diabetes mellitus   . Heart disease   . History of echocardiogram    Echo 8/18:  EF 55-60, no RWMA, severe LAE, normal RVSF, mild RAE  . History of radiation therapy 12/07/16- 01/24/17   Base of Tongue 70 Gy in 35 fractions  . Hyperlipidemia   . OSA (obstructive sleep apnea)   . Shingles   . 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 It sometimes shows as a shadow on scans.     Past Surgical History:  Procedure Laterality Date  . CARDIAC DEFIBRILLATOR PLACEMENT  2006  . CATARACT EXTRACTION Bilateral    01/07/2016. 01/22/2016  . CYST EXCISION     multiple drainage to a cyst in his neck.   Marland Kitchen DIRECT LARYNGOSCOPY N/A 11/16/2016   Procedure: DIRECT LARYNGOSCOPY AND BIOPSY;  Surgeon: Collin Nunnery, MD;  Location: [place];  Service: ENT;  Laterality: N/A;  . HERNIA REPAIR     1950's  . IMPLANTABLE CARDIOVERTER DEFIBRILLATOR (ICD) GENERATOR CHANGE N/A 05/10/2014   Procedure: ICD GENERATOR CHANGE;  Surgeon: Collin Lance, MD;  Location: [facility] CATH LAB;  Service: Cardiovascular;  Laterality: N/A;  . IR GASTROSTOMY TUBE MOD SED  12/27/2016  . IR PATIENT EVAL TECH 0-60 MINS  01/17/2017  . MASS EXCISION  Right 11/16/2016   Procedure: EXCISION RIGHT NECK LIPOMA;  Surgeon: Collin Nunnery, MD;  Location: [facility];  Service: ENT;  Laterality: Right;  . MOHS SURGERY  01/01/2015   to top of head  . MOUTH SURGERY     teeth removed 4 teeth extracted 02/2010    There were no vitals filed for this visit.      Subjective Assessment - 05/27/17 1031    Subjective Nothing new today. Felt fine after last session. Got a couple of shots--fo flu and pneumonia.   Currently in Pain? No/denies                         [facility] Adult PT Treatment/Exercise - 05/27/17 0001      Knee/Hip Exercises: Aerobic   Nustep 12 mins. at level 5 resistance, arm handles at #9  no problem for patient to do this     Knee/Hip Exercises: Standing   Heel Raises Both;20 reps;1 second   Hip Abduction AROM;Right;Left;10 reps;Knee straight   Wall Squat 10 reps  mild dyspnea following     Shoulder Exercises: ROM/Strengthening   "W" Arms x 10, back against wall   Other ROM/Strengthening Exercises 3-way arm raises with 2 lbs. each hand x 10 each way

## 2017-05-30 ENCOUNTER — Ambulatory Visit: Payer: Medicare HMO | Admitting: Physical Therapy

## 2017-05-30 ENCOUNTER — Telehealth: Payer: Self-pay | Admitting: Cardiology

## 2017-05-30 ENCOUNTER — Other Ambulatory Visit: Payer: Self-pay | Admitting: Radiation Oncology

## 2017-05-30 DIAGNOSIS — C140 Malignant neoplasm of pharynx, unspecified: Secondary | ICD-10-CM | POA: Diagnosis not present

## 2017-05-30 DIAGNOSIS — I428 Other cardiomyopathies: Secondary | ICD-10-CM | POA: Diagnosis not present

## 2017-05-30 DIAGNOSIS — L599 Disorder of the skin and subcutaneous tissue related to radiation, unspecified: Secondary | ICD-10-CM | POA: Diagnosis not present

## 2017-05-30 DIAGNOSIS — C01 Malignant neoplasm of base of tongue: Secondary | ICD-10-CM

## 2017-05-30 DIAGNOSIS — R5383 Other fatigue: Secondary | ICD-10-CM

## 2017-05-30 DIAGNOSIS — I890 Lymphedema, not elsewhere classified: Secondary | ICD-10-CM

## 2017-05-30 DIAGNOSIS — R293 Abnormal posture: Secondary | ICD-10-CM

## 2017-05-30 DIAGNOSIS — M6281 Muscle weakness (generalized): Secondary | ICD-10-CM

## 2017-05-30 NOTE — Therapy (Signed)
[facility] [address] [place], [place], [address] Phone: [phone]   Fax:  [phone]  Physical Therapy Treatment  Patient Details  Name: Collin [name] MRN: [id number] Date of Birth: 06/27/1941 Referring Provider: Dr. Sarah Squire  Encounter Date: 05/30/2017      PT End of Session - 05/30/17 1158    Visit Number 7   Number of Visits 9   Date for PT Re-Evaluation 06/05/17   PT Start Time 1105   PT Stop Time 1151   PT Time Calculation (min) 46 min   Activity Tolerance Patient tolerated treatment well   Behavior During Therapy WFL for tasks assessed/performed      Past Medical History:  Diagnosis Date  . Atrial fibrillation (HCC)   . Cardiomyopathy, nonischemic (HCC) 1998  . Diabetes mellitus   . Heart disease   . History of echocardiogram    Echo 8/18:  EF 55-60, no RWMA, severe LAE, normal RVSF, mild RAE  . History of radiation therapy 12/07/16- 01/24/17   Base of Tongue 70 Gy in 35 fractions  . Hyperlipidemia   . OSA (obstructive sleep apnea)   . Shingles   . 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 It sometimes shows as a shadow on scans.     Past Surgical History:  Procedure Laterality Date  . CARDIAC DEFIBRILLATOR PLACEMENT  2006  . CATARACT EXTRACTION Bilateral    01/07/2016. 01/22/2016  . CYST EXCISION     multiple drainage to a cyst in his neck.   . DIRECT LARYNGOSCOPY N/A 11/16/2016   Procedure: DIRECT LARYNGOSCOPY AND BIOPSY;  Surgeon: Christopher E Newman, MD;  Location: [facility] OR;  Service: ENT;  Laterality: N/A;  . HERNIA REPAIR     1950's  . IMPLANTABLE CARDIOVERTER DEFIBRILLATOR (ICD) GENERATOR CHANGE N/A 05/10/2014   Procedure: ICD GENERATOR CHANGE;  Surgeon: Gregg W Taylor, MD;  Location: [facility] CATH LAB;  Service: Cardiovascular;  Laterality: N/A;  . IR GASTROSTOMY TUBE MOD SED  12/27/2016  . IR PATIENT EVAL TECH 0-60 MINS  01/17/2017  . MASS  EXCISION Right 11/16/2016   Procedure: EXCISION RIGHT NECK LIPOMA;  Surgeon: Christopher E Newman, MD;  Location: [facility] OR;  Service: ENT;  Laterality: Right;  . MOHS SURGERY  01/01/2015   to top of head  . MOUTH SURGERY     teeth removed 4 teeth extracted 02/2010    There were no vitals filed for this visit.      Subjective Assessment - 05/30/17 1108    Subjective "He's been eating!" (says his wife).  Pt. says it still gets caught in the throat, but taste buds are fantastic.  Wife hasn't done the massage. No soreness after exercise session last time, felt good. Wife couldn't get the compression socks on.   Currently in Pain? No/denies                         [facility] Adult PT Treatment/Exercise - 05/30/17 0001      Knee/Hip Exercises: Aerobic   Nustep 5  mins. at level 7  this was challenging today; try level 6 next time     Manual Therapy   Manual Lymphatic Drainage (MLD) Manual lymph drainage for face and neck in supine with HOB elevated including diaphragmatic breathing, supraclavicular fossae, bilat. axillae, bilat. shoulder collectors; posterolateral, lateral, anterolateral, and anterior neck; chin, cheeks and preauricular areas;   [facility] [address] [place], [place], [address] Phone: [phone]   Fax:  [phone]  Physical Therapy Treatment  Patient Details  Name: Collin [name] MRN: [id number] Date of Birth: 06/08/1941 Referring Provider: Dr. Sarah Squire  Encounter Date: 05/30/2017      PT End of Session - 05/30/17 1158    Visit Number 7   Number of Visits 9   Date for PT Re-Evaluation 06/05/17   PT Start Time 1105   PT Stop Time 1151   PT Time Calculation (min) 46 min   Activity Tolerance Patient tolerated treatment well   Behavior During Therapy WFL for tasks assessed/performed      Past Medical History:  Diagnosis Date  . Atrial fibrillation (HCC)   . Cardiomyopathy, nonischemic (HCC) 1998  . Diabetes mellitus   . Heart disease   . History of echocardiogram    Echo 8/18:  EF 55-60, no RWMA, severe LAE, normal RVSF, mild RAE  . History of radiation therapy 12/07/16- 01/24/17   Base of Tongue 70 Gy in 35 fractions  . Hyperlipidemia   . OSA (obstructive sleep apnea)   . Shingles   . 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 It sometimes shows as a shadow on scans.     Past Surgical History:  Procedure Laterality Date  . CARDIAC DEFIBRILLATOR PLACEMENT  2006  . CATARACT EXTRACTION Bilateral    01/07/2016. 01/22/2016  . CYST EXCISION     multiple drainage to a cyst in his neck.   . DIRECT LARYNGOSCOPY N/A 11/16/2016   Procedure: DIRECT LARYNGOSCOPY AND BIOPSY;  Surgeon: Christopher E Newman, MD;  [facility]: [facility] OR;  Service: ENT;  Laterality: N/A;  . HERNIA REPAIR     1950's  . IMPLANTABLE CARDIOVERTER DEFIBRILLATOR (ICD) GENERATOR CHANGE N/A 05/10/2014   Procedure: ICD GENERATOR CHANGE;  Surgeon: Gregg W Taylor, MD;  Location: [facility] CATH LAB;  Service: Cardiovascular;  Laterality: N/A;  . IR GASTROSTOMY TUBE MOD SED  12/27/2016  . IR PATIENT EVAL TECH 0-60 MINS  01/17/2017  . MASS  EXCISION Right 11/16/2016   Procedure: EXCISION RIGHT NECK LIPOMA;  Surgeon: Christopher E Newman, MD;  [facility] OR;  Service: ENT;  Laterality: Right;  . MOHS SURGERY  01/01/2015   to top of head  . MOUTH SURGERY     teeth removed 4 teeth extracted 02/2010    There were no vitals filed for this visit.      Subjective Assessment - 05/30/17 1108    Subjective "He's been eating!" (says his wife).  Pt. says it still gets caught in the throat, but taste buds are fantastic.  Wife hasn't done the massage. No soreness after exercise session last time, felt good. Wife couldn't get the compression socks on.   Currently in Pain? No/denies                         [facility] Adult PT Treatment/Exercise - 05/30/17 0001      Knee/Hip Exercises: Aerobic   Nustep 5  mins. at level 7  this was challenging today; try level 6 next time     Manual Therapy   Manual Lymphatic Drainage (MLD) Manual lymph drainage for face and neck in supine with HOB elevated including diaphragmatic breathing, supraclavicular fossae, bilat. axillae, bilat. shoulder collectors; posterolateral, lateral, anterolateral, and anterior neck; chin, cheeks and preauricular areas;   [address] , [place], [address] Phone: [phone]   Fax:  [phone]  Name: Paulo D Loll MRN: [id number] Date of Birth: 03/29/1941  [name] [name], PT 05/30/17 12:02 PM  

## 2017-05-30 NOTE — Telephone Encounter (Signed)
Entered in error wrong patient.
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
## 2017-06-01 ENCOUNTER — Ambulatory Visit: Payer: Medicare HMO

## 2017-06-01 DIAGNOSIS — R293 Abnormal posture: Secondary | ICD-10-CM

## 2017-06-01 DIAGNOSIS — I890 Lymphedema, not elsewhere classified: Secondary | ICD-10-CM

## 2017-06-01 DIAGNOSIS — R590 Localized enlarged lymph nodes: Secondary | ICD-10-CM | POA: Diagnosis not present

## 2017-06-01 DIAGNOSIS — L599 Disorder of the skin and subcutaneous tissue related to radiation, unspecified: Secondary | ICD-10-CM | POA: Diagnosis not present

## 2017-06-01 DIAGNOSIS — M6281 Muscle weakness (generalized): Secondary | ICD-10-CM | POA: Diagnosis not present

## 2017-06-01 NOTE — Therapy (Addendum)
Thrush, oral 12/21/2016  . Weight loss, abnormal 12/21/2016  . Malignant neoplasm of base of tongue ([place]) 11/24/2016  . Umbilical hernia without obstruction and without gangrene   . Pressure injury of skin 11/14/2016  . Tongue mass: Per Ct neck 11/10/2016 11/12/2016  . SBO (small bowel obstruction) ([place]) 11/11/2016  . Encounter for therapeutic drug monitoring 10/25/2013  . Diabetic peripheral neuropathy ([place]) 10/14/2011  . Hypotension 08/20/2011  . Acute renal failure ([place]) 08/20/2011  . OSA (obstructive sleep apnea) 04/15/2011  . ICD-[employer] 03/30/2011  . Chronic systolic CHF (congestive heart failure) ([place]) 03/30/2011  . PAF (paroxysmal atrial fibrillation) ([place]) 12/14/2010  . Nonischemic cardiomyopathy ([place]) 12/14/2010  . Type 2 diabetes mellitus ([place]) 12/14/2010  . Hyperlipidemia 12/14/2010    Otelia Limes, PTA 06/01/2017, 2:49 PM  [place], [place], [address] Phone: [phone]   Fax:  [phone]  Name: GUALBERTO WAHLEN MRN: [id number] Date of Birth: 1941/03/18  PHYSICAL THERAPY DISCHARGE SUMMARY  Visits from Start of Care: 8 Current functional level related to goals / functional outcomes: One goal deferred, others met.   Remaining deficits: Swelling will continue to require management. It is difficult for him to achieve optimal  management because he is claustrophobic and can't tolerate a compression garment.   Education / Equipment: Wife learned manual lymph drainage. Plan: Patient agrees to discharge.  Patient goals were met. Patient is being discharged due to the patient's request.  ?????    [name], PT 06/01/17 5:35 PM  [place], [place], [address] Phone: [phone]   Fax:  [phone]  Physical Therapy Treatment  Patient Details  Name: Collin [name] MRN: [id number] Date of Birth: 11-20-1940 Referring Provider: Dr. Eppie Gibson  Encounter Date: 06/01/2017      PT End of Session - 06/01/17 1432    Visit Number 8   Number of Visits 9   Date for PT Re-Evaluation 06/05/17   PT Start Time 1350   PT Stop Time 1437   PT Time Calculation (min) 47 min   Activity Tolerance Patient tolerated treatment well   Behavior During Therapy [facility] for tasks assessed/performed      Past Medical History:  Diagnosis Date  . Atrial fibrillation ([place])   . Cardiomyopathy, nonischemic ([place]) 1998  . Diabetes mellitus   . Heart disease   . History of echocardiogram    Echo 8/18:  EF 55-60, no RWMA, severe LAE, normal RVSF, mild RAE  . History of radiation therapy 12/07/16- 01/24/17   Base of Tongue 70 Gy in 35 fractions  . Hyperlipidemia   . OSA (obstructive sleep apnea)   . Shingles   . 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 It sometimes shows as a shadow on scans.     Past Surgical History:  Procedure Laterality Date  . CARDIAC DEFIBRILLATOR PLACEMENT  2006  . CATARACT EXTRACTION Bilateral    01/07/2016. 01/22/2016  . CYST EXCISION     multiple drainage to a cyst in his neck.   Marland Kitchen DIRECT LARYNGOSCOPY N/A 11/16/2016   Procedure: DIRECT LARYNGOSCOPY AND BIOPSY;  Surgeon: Rozetta Nunnery, MD;  Location: [facility];  Service: ENT;  Laterality: N/A;  . HERNIA REPAIR     1950's  . IMPLANTABLE CARDIOVERTER DEFIBRILLATOR (ICD) GENERATOR CHANGE N/A 05/10/2014   Procedure: ICD GENERATOR CHANGE;  Surgeon: [name], MD;  Location: [facility] CATH LAB;  Service: Cardiovascular;  Laterality: N/A;  . IR GASTROSTOMY TUBE MOD SED  12/27/2016  . IR PATIENT EVAL TECH 0-60 MINS  01/17/2017  . MASS  EXCISION Right 11/16/2016   Procedure: EXCISION RIGHT NECK LIPOMA;  Surgeon: Rozetta Nunnery, MD;  Location: [facility];  Service: ENT;  Laterality: Right;  . MOHS SURGERY  01/01/2015   to top of head  . MOUTH SURGERY     teeth removed 4 teeth extracted 02/2010    There were no vitals filed for this visit.      Subjective Assessment - 06/01/17 1357    Subjective We saw Dr. Lucia Gaskins the ENT this morning. He wants to remove the knot at the side of my throat but he has to talk to my other doctors.    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 Patient underwent a PET scan on 11/22/16. This showed hypermetabolic base of tongue mass compatible with known neoplasm. 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  He will lhave XRT only, due to comorbidities of renal failure, type 2 DM, cardiac history, started 12/07/16. Treatment was finished 01/24/17.   Patient Stated Goals get help with swelling   Currently in Pain? No/denies               LYMPHEDEMA/ONCOLOGY QUESTIONNAIRE -  [place], [place], [address] Phone: [phone]   Fax:  [phone]  Physical Therapy Treatment  Patient Details  Name: Collin [name] MRN: [id number] Date of Birth: 11-20-1940 Referring Provider: Dr. Eppie Gibson  Encounter Date: 06/01/2017      PT End of Session - 06/01/17 1432    Visit Number 8   Number of Visits 9   Date for PT Re-Evaluation 06/05/17   PT Start Time 1350   PT Stop Time 1437   PT Time Calculation (min) 47 min   Activity Tolerance Patient tolerated treatment well   Behavior During Therapy [facility] for tasks assessed/performed      Past Medical History:  Diagnosis Date  . Atrial fibrillation ([place])   . Cardiomyopathy, nonischemic ([place]) 1998  . Diabetes mellitus   . Heart disease   . History of echocardiogram    Echo 8/18:  EF 55-60, no RWMA, severe LAE, normal RVSF, mild RAE  . History of radiation therapy 12/07/16- 01/24/17   Base of Tongue 70 Gy in 35 fractions  . Hyperlipidemia   . OSA (obstructive sleep apnea)   . Shingles   . 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 It sometimes shows as a shadow on scans.     Past Surgical History:  Procedure Laterality Date  . CARDIAC DEFIBRILLATOR PLACEMENT  2006  . CATARACT EXTRACTION Bilateral    01/07/2016. 01/22/2016  . CYST EXCISION     multiple drainage to a cyst in his neck.   Marland Kitchen DIRECT LARYNGOSCOPY N/A 11/16/2016   Procedure: DIRECT LARYNGOSCOPY AND BIOPSY;  Surgeon: Rozetta Nunnery, MD;  Location: [facility];  Service: ENT;  Laterality: N/A;  . HERNIA REPAIR     1950's  . IMPLANTABLE CARDIOVERTER DEFIBRILLATOR (ICD) GENERATOR CHANGE N/A 05/10/2014   Procedure: ICD GENERATOR CHANGE;  Surgeon: [name], MD;  Location: [facility] CATH LAB;  Service: Cardiovascular;  Laterality: N/A;  . IR GASTROSTOMY TUBE MOD SED  12/27/2016  . IR PATIENT EVAL TECH 0-60 MINS  01/17/2017  . MASS  EXCISION Right 11/16/2016   Procedure: EXCISION RIGHT NECK LIPOMA;  Surgeon: Rozetta Nunnery, MD;  Location: [facility];  Service: ENT;  Laterality: Right;  . MOHS SURGERY  01/01/2015   to top of head  . MOUTH SURGERY     teeth removed 4 teeth extracted 02/2010    There were no vitals filed for this visit.      Subjective Assessment - 06/01/17 1357    Subjective We saw Dr. Lucia Gaskins the ENT this morning. He wants to remove the knot at the side of my throat but he has to talk to my other doctors.    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 Patient underwent a PET scan on 11/22/16. This showed hypermetabolic base of tongue mass compatible with known neoplasm. 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  He will lhave XRT only, due to comorbidities of renal failure, type 2 DM, cardiac history, started 12/07/16. Treatment was finished 01/24/17.   Patient Stated Goals get help with swelling   Currently in Pain? No/denies               LYMPHEDEMA/ONCOLOGY QUESTIONNAIRE -

## 2017-06-06 ENCOUNTER — Ambulatory Visit: Payer: Medicare HMO | Attending: Radiation Oncology

## 2017-06-06 DIAGNOSIS — R1310 Dysphagia, unspecified: Secondary | ICD-10-CM

## 2017-06-06 NOTE — Patient Instructions (Signed)
Signs of Aspiration Pneumonia   . Chest pain/tightness . Fever (can be low grade) . Cough  o With foul-smelling phlegm (sputum) o With sputum containing pus or blood o With greenish sputum . Fatigue  . Shortness of breath  . Wheezing   **IF YOU HAVE THESE SIGNS, CONTACT YOUR DOCTOR OR GO TO THE EMERGENCY DEPARTMENT OR URGENT CARE AS SOON AS POSSIBLE**     =====================================================  You will need to try more to eat by mouth, and in greater amounts. Keep eating what tastes good, and that passes more easily. Take a small bite, swallow hard twice, then immediately take a sip of water and swallow twice. This will help with having the food move through your throat.  You need to do the exercises with the recommended frequency (twice a day) with the recommended repetitions. Look at your sheet at home. Many have 20 repetitions prescribed.

## 2017-06-06 NOTE — Therapy (Signed)
[facility] [address] [place], [place], [address] Phone: [phone]   Fax:  [phone]  Speech Language Pathology Treatment  Patient Details  Name: Collin [name] MRN: [id number] Date of Birth: 1940/09/18 Referring Provider: Lonie [place] MD  Encounter Date: 06/06/2017      End of Session - 06/06/17 1152    Visit Number 6   Number of Visits 7   Date for SLP Re-Evaluation 07/01/17   SLP Start Time 1105   SLP Stop Time  1145   SLP Time Calculation (min) 40 min   Activity Tolerance Patient tolerated treatment well      Past Medical History:  Diagnosis Date  . Atrial fibrillation ([facility])   . Cardiomyopathy, nonischemic ([facility]) 1998  . Diabetes mellitus   . Heart disease   . History of echocardiogram    Echo 8/18:  EF 55-60, no RWMA, severe LAE, normal RVSF, mild RAE  . History of radiation therapy 12/07/16- 01/24/17   Base of Tongue 70 Gy in 35 fractions  . Hyperlipidemia   . OSA (obstructive sleep apnea)   . Shingles   . 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 It sometimes shows as a shadow on scans.     Past Surgical History:  Procedure Laterality Date  . CARDIAC DEFIBRILLATOR PLACEMENT  2006  . CATARACT EXTRACTION Bilateral    01/07/2016. 01/22/2016  . CYST EXCISION     multiple drainage to a cyst in his neck.   Marland Kitchen DIRECT LARYNGOSCOPY N/A 11/16/2016   Procedure: DIRECT LARYNGOSCOPY AND BIOPSY;  Surgeon: [name], MD;  Location: [facility] OR;  Service: ENT;  Laterality: N/A;  . HERNIA REPAIR     1950's  . IMPLANTABLE CARDIOVERTER DEFIBRILLATOR (ICD) GENERATOR CHANGE N/A 05/10/2014   Procedure: ICD GENERATOR CHANGE;  Surgeon: [name], MD;  Location: [facility] CATH LAB;  Service: Cardiovascular;  Laterality: N/A;  . IR GASTROSTOMY TUBE MOD SED  12/27/2016  . IR PATIENT EVAL TECH 0-60 MINS  01/17/2017  . MASS EXCISION Right 11/16/2016   Procedure:  EXCISION RIGHT NECK LIPOMA;  Surgeon: [name], MD;  Location: [facility] OR;  Service: ENT;  Laterality: Right;  . MOHS SURGERY  01/01/2015   to top of head  . MOUTH SURGERY     teeth removed 4 teeth extracted 02/2010    There were no vitals filed for this visit.      Subjective Assessment - 06/06/17 1115    Subjective "the saliva has subsided a bit but what is still there is very thick." Pt has eaten food more often in last 4 weeks.   Patient is accompained by: Family member  wife   Currently in Pain? No/denies               ADULT SLP TREATMENT - 06/06/17 1117      General Information   Behavior/Cognition Alert;Cooperative;Pleasant mood     Treatment Provided   Treatment provided Dysphagia     Dysphagia Treatment   Temperature Spikes Noted No   Respiratory Status Room air   Treatment Methods Skilled observation;Therapeutic exercise;Patient/caregiver education   Patient observed directly with PO's Yes   Type of PO's observed Thin liquids;Dysphagia 1 (puree)   Liquids provided via Cup   Pharyngeal Phase Signs & Symptoms Complaints of residue;Multiple swallows  cued alternate solid/liuid assisted pt in clearance residue   Other treatment/comments SLP appreciates  pt's bulky mass on lt neck - to midline. Pt/wife report question is being raised now whether to excise mass or not. Pt appears brighter today, regarding POs. Reports trying to eat more frequently, still very small amounts, however. Pt is still completing HEP with suboptimal frequency - SLP reminded pt of frequency and number of reps necessary to attempt to inhibit muscle fibrosis. SLP reminded pt of the need to use swallowing musculature to stave off muscle disuse atrophy. Pt req'd min-mod cues usually for HEP completion. Pt was able to tell SLP 3 overt s/s aspiration PNA iwth modiified independnence.     Assessment / Recommendations / Plan   Plan Continue with current plan of care     Progression Toward  Goals   Progression toward goals Progressing toward goals          SLP Education - 06/06/17 1150    Education provided Yes   Education Details overt s/s aspiration PNA, HEP procedure, late effects head/neck radiation, need to incr POs and need to maintain prescribed frequency and repetitions of HEP to inhibit muscle fibrosis   Person(s) Educated Patient;Spouse   Methods Explanation;Demonstration;Verbal cues   Comprehension Verbalized understanding;Returned demonstration;Verbal cues required;Need further instruction          SLP Short Term Goals - 02/07/17 0948      SLP SHORT TERM GOAL #1   Title pt will complete HEP with usual A   Status Achieved     SLP SHORT TERM GOAL #2   Title pt/family will tell SLP why he is completing HEP with modified independence   Status Achieved  family told SLP          SLP Long Term Goals - 06/06/17 1155      SLP LONG TERM GOAL #1   Title pt will complete HEP with occasional min A    Baseline renewed 04-25-17 - all LTGs   Time 2   Period --  visits   Status On-going  goal ongoing     SLP LONG TERM GOAL #2   Title pt/family will tell SLP three overt s/s aspiration PNA with modified independence   Status Achieved  and ongoing     SLP LONG TERM GOAL #3   Title pt/family will tell SLP benefits of a food journal   Time 2  5 total visits   Period --  vistis   Status On-going  and ongoing          Plan - 06/06/17 1152    Clinical Impression Statement Pt with oropharyngeal swallowing functional for liquids and now for pureed items as tested today, using double swallow and effortful swallow with alternating liquids/solids. Pt continues as noncompliant with HEP frequency and recommedned reps although he and wife report he is doing HEP more frequently than at last session. SLP again strongly encouraged pt to incr. frequency to x2/day with explanation why. Wife continues with adequate knowledge of HEP procedure in case she needs to help  pt complete correctly. Pt will need to cont to be followed at least for one more session by SLP for regular assessment of accurate HEP completion as well as for safety with POs.    Speech Therapy Frequency --  once approx every four weeks   Duration --  2 addition visits (7 total)   Treatment/Interventions Aspiration precaution training;Pharyngeal strengthening exercises;Diet toleration management by SLP;Language facilitation;Compensatory techniques;SLP instruction and feedback;Internal/external aids;Trials of upgraded texture/liquids;Patient/family education   Potential to Achieve Goals Fair   Potential  Considerations Ability to learn/carryover information;Cooperation/participation level      Patient will benefit from skilled therapeutic intervention in order to improve the following deficits and impairments:   Dysphagia, unspecified type    Problem List Patient Active Problem List   Diagnosis Date Noted  . 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 Thrush, oral 12/21/2016  . Weight loss, abnormal 12/21/2016  . 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 Umbilical hernia without obstruction and without gangrene   . Pressure injury of skin 11/14/2016  . Tongue mass: Per Ct neck 11/10/2016 11/12/2016  . SBO (small bowel obstruction) ([facility]) 11/11/2016  . Encounter for therapeutic drug monitoring 10/25/2013  . Diabetic peripheral neuropathy ([facility]) 10/14/2011  . Hypotension 08/20/2011  . Acute renal failure ([facility]) 08/20/2011  . OSA (obstructive sleep apnea) 04/15/2011  . ICD-[employer] 03/30/2011  . Chronic systolic CHF (congestive heart failure) ([facility]) 03/30/2011  . PAF (paroxysmal atrial fibrillation) ([facility]) 12/14/2010  . Nonischemic cardiomyopathy ([facility]) 12/14/2010  . Type 2 diabetes mellitus ([facility]) 12/14/2010  . Hyperlipidemia 12/14/2010    Stevie Ertle ,MS, CCC-SLP  06/06/2017, 11:56 AM  [facility] [address] [place], [place], [address] Phone: [phone]   Fax:  [phone]   Name: Collin [name] MRN: [id number] Date of Birth: Nov 02, 1940

## 2017-06-08 ENCOUNTER — Ambulatory Visit (INDEPENDENT_AMBULATORY_CARE_PROVIDER_SITE_OTHER): Payer: Medicare HMO | Admitting: Physician Assistant

## 2017-06-08 ENCOUNTER — Encounter: Payer: Self-pay | Admitting: Physician Assistant

## 2017-06-08 VITALS — BP 110/62 | HR 62 | Ht 72.0 in | Wt 234.1 lb

## 2017-06-08 DIAGNOSIS — C01 Malignant neoplasm of base of tongue: Secondary | ICD-10-CM

## 2017-06-08 DIAGNOSIS — I481 Persistent atrial fibrillation: Secondary | ICD-10-CM | POA: Diagnosis not present

## 2017-06-08 DIAGNOSIS — I5030 Unspecified diastolic (congestive) heart failure: Secondary | ICD-10-CM

## 2017-06-08 DIAGNOSIS — Z95810 Presence of automatic (implantable) cardiac defibrillator: Secondary | ICD-10-CM | POA: Diagnosis not present

## 2017-06-08 DIAGNOSIS — I4819 Other persistent atrial fibrillation: Secondary | ICD-10-CM

## 2017-06-08 MED ORDER — TORSEMIDE 20 MG PO TABS: 20.0000 mg | ORAL_TABLET | Freq: Every day | ORAL | 0 refills | Status: DC

## 2017-06-08 MED ORDER — TORSEMIDE 20 MG PO TABS: 20.0000 mg | ORAL_TABLET | Freq: Every day | ORAL | 3 refills | Status: DC

## 2017-06-08 NOTE — Progress Notes (Signed)
Cardiology Office Note:    Date:  06/08/2017   ID:  Collin [name], DOB 26-Nov-1940, MRN [id number]  PCP:  [name], MD  Cardiologist:  [name]   Electrophysiologist: [name]    Referring MD: [name], MD   Chief Complaint  Patient presents with  . Congestive Heart Failure    follow up    History of Present Illness:    Collin [name] is a 76 y.o. male with a hx of  NICM, systolic heart failure, atrial fibrillation, diabetes, hyperlipidemia, sleep apnea. He established with [name] in 4/12. He moved to [place] from [place] in 2011. He has a history of normal coronary arteries by cardiac catheterization in 2002. Prior EF was 28%. He is status post ICD and is followed by Dr. [name].  Echocardiogram in 2015 demonstrated normal LV function with an EF of 55-60%.  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  He has a feeding tube.  He was by Dr. Tenny Craw 7/18 and was volume overloaded. Lower extremity venous ultrasound was obtained and this was negative for DVT.  I saw him 04/25/17.  He continued to have lower extremity edema.  A BNP was elevated and his Lasix was increased further.   Collin [name] returns for follow up.  He continues to have significant LE edema.  It is unchanged. He denies any significant shortness of breath.  He has not had chest pain, syncope, paroxysmal nocturnal dyspnea.  He is starting to eat food again.    Prior CV studies:   The following studies were reviewed today:  PET Scan 05/17/17 IMPRESSION: 1. Response to therapy. 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 2. No evidence of extracervical metastatic disease. 3. Heterogeneous hepatic activity is nonspecific. Concurrent caudate lobe enlargement and gynecomastia. Correlate with risk factors for cirrhosis. Consider dedicated abdominal imaging, ideally with pre and post contrast abdominal  MRI. 4. Cardiomegaly with suspicion of elevated right heart pressures. Pulmonary artery enlargement suggests pulmonary arterial hypertension. 5. Coronary artery atherosclerosis. Aortic Atherosclerosis (ICD10-I70.0). 6. Cholelithiasis. 7. Prostatomegaly with bladder outlet obstruction. 8. Fat containing ventral pelvic wall hernia. 9. Pulmonary artery enlargement suggests pulmonary arterial Hypertension.  Echocardiogram 04/28/17 EF 55-60, normal wall motion, severe LAE, normal RVSF, mild RAE  LE venous US 03/24/17 No evidence of lower extremity deep or superficial venous thrombus or incompetence, bilaterally.  Echo 04/22/14 Mod conc LVH, EF 55-60, no RWMA, mildly dilated ascending aorta (root 40 mm, ascending aorta 37 mm), severe LAE, severe RAE, lipoma hypertrophy of the Atrial septum, mild TR  Echo 04/17/13 Moderate LVH, EF 35-40, diffuse HK, moderate to severe LAE, mild RAE  Echo 5/12 Moderate LVH, EF 50-55, moderate LAE, mild RAE  Past Medical History:  Diagnosis Date  . Atrial fibrillation (HCC)   . Cardiomyopathy, nonischemic (HCC) 1998  . Diabetes mellitus   . Heart disease   . History of echocardiogram    Echo 8/18:  EF 55-60, no RWMA, severe LAE, normal RVSF, mild RAE  . History of radiation therapy 12/07/16- 01/24/17   Base of Tongue 70 Gy in 35 fractions  . Hyperlipidemia   . OSA (obstructive sleep apnea)   . Shingles   . 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 It sometimes shows as a shadow on scans.     Past Surgical History:  Procedure  Laterality Date  . CARDIAC DEFIBRILLATOR PLACEMENT  2006  . CATARACT EXTRACTION Bilateral    01/07/2016. 01/22/2016  . CYST EXCISION     multiple drainage to a cyst in his neck.   Marland Kitchen DIRECT LARYNGOSCOPY N/A 11/16/2016   Procedure: DIRECT LARYNGOSCOPY AND BIOPSY;  Surgeon: [name], MD;  Location: [facility];  Service: ENT;  Laterality: N/A;  . HERNIA REPAIR     1950's   . IMPLANTABLE CARDIOVERTER DEFIBRILLATOR (ICD) GENERATOR CHANGE N/A 05/10/2014   Procedure: ICD GENERATOR CHANGE;  Surgeon: [name], MD;  Location: [facility];  Service: Cardiovascular;  Laterality: N/A;  . IR GASTROSTOMY TUBE MOD SED  12/27/2016  . IR PATIENT EVAL TECH 0-60 MINS  01/17/2017  . MASS EXCISION Right 11/16/2016   Procedure: EXCISION RIGHT NECK LIPOMA;  Surgeon: [name], MD;  Location: [facility] OR;  Service: ENT;  Laterality: Right;  . MOHS SURGERY  01/01/2015   to top of head  . MOUTH SURGERY     teeth removed 4 teeth extracted 02/2010    Current Medications: Current Meds  Medication Sig  . acetaminophen (TYLENOL) 500 MG tablet Take 1,000 mg by mouth every 6 (six) hours as needed for moderate pain.  . carvedilol (COREG) 25 MG tablet TAKE 1 TABLET TWICE DAILY WITH MEALS  . digoxin (LANOXIN) 0.125 MG tablet Take by mouth daily.  Marland Kitchen diltiazem (CARDIZEM) 90 MG tablet Take 1 tablet (90 mg total) by mouth daily.  . Nutritional Supplements (FEEDING SUPPLEMENT, OSMOLITE 1.5 CAL,) LIQD Give 2 cans of formula at 8am and 12 noon. Give additional 1 1/2 cans at 4pm and 8pm feeding.   Flush with 60ml of water before and after feeding 4 times per day  . Nutritional Supplements (PROMOD) LIQD Give 30ml (1 oz) 2 times per day via tube. Flush with 60ml of water before and after given.  . polyethylene glycol (MIRALAX) packet Take 17 g by mouth daily as needed. Available OTC  . potassium chloride (K-DUR) 10 MEQ tablet Dissolve 1 tablet in water and administer via PEG tube daily  . sodium fluoride (PREVIDENT 5000 PLUS) 1.1 % CREA dental cream Apply gel to tooth brush. Brush teeth for 2 minutes. Spit out excess-DO NOT swallow. Repeat nightly.  . warfarin (COUMADIN) 5 MG tablet TAKE AS DIRECTED BY ANTICOAGULATION CLINIC.  . [DISCONTINUED] furosemide (LASIX) 20 MG tablet TAKE 3 TABLETS BY MOUTH (60 MG TOTAL) DAILY     Allergies:   Niaspan [niacin er]   Social History   Social History   . Marital status: Married    Spouse name: [name]  . Number of children: 3  . Years of education: N/A   Occupational History  . Retired Engineer, materials    Social History Main Topics  . Smoking status: Never Smoker  . Smokeless tobacco: Never Used  . Alcohol use Yes     Comment: he has a past history of social drinking  . Drug use: No  . Sexual activity: Not Asked   Other Topics Concern  . None   Social History Narrative  . None     Family Hx: The patient's family history includes Heart disease in his mother; Hypertension in his father; Lupus in his mother; Stroke in his father; Sudden death in his mother.  ROS:   Please see the history of present illness.    Review of Systems  Constitution: Positive for malaise/fatigue.  HENT: Positive for hearing loss.   Eyes: Positive for visual  disturbance.  Cardiovascular: Positive for dyspnea on exertion and leg swelling.  Neurological: Positive for loss of balance.  Psychiatric/Behavioral: Positive for depression.   All other systems reviewed and are negative.   EKGs/Labs/Other Test Reviewed:    EKG:  EKG is not ordered today.  The ekg ordered today demonstrates n/a  Recent Labs: 09/16/2016: BNP CANCELED 11/15/2016: Magnesium 2.1 12/27/2016: Hemoglobin 13.3; Platelets 227 03/11/2017: ALT 21; TSH 1.360 04/25/2017: NT-Pro BNP 676 05/04/2017: BUN 25; Creatinine, Ser 0.68; Potassium 3.9; Sodium 141   Recent Lipid Panel Lab Results  Component Value Date/Time   CHOL 156 09/16/2016 02:44 PM   TRIG 324 (H) 09/16/2016 02:44 PM   HDL 42 09/16/2016 02:44 PM   CHOLHDL 3.7 09/16/2016 02:44 PM   CHOLHDL 5 11/13/2015 03:25 PM   LDLCALC 49 09/16/2016 02:44 PM   LDLDIRECT 72.0 11/13/2015 03:25 PM    Physical Exam:    VS:  BP 110/62   Pulse 62   Ht 6' (1.829 m)   Wt 234 lb 1.9 oz (106.2 kg)   SpO2 96%   BMI 31.75 kg/m     Wt Readings from Last 3 Encounters:  06/08/17 234 lb 1.9 oz (106.2 kg)  05/25/17 232 lb (105.2 kg)   05/20/17 232 lb 3.2 oz (105.3 kg)     Physical Exam  Constitutional: He is oriented to person, place, and time. He appears well-developed and well-nourished. No distress.  HENT:  Head: Normocephalic and atraumatic.  Eyes: No scleral icterus.  Neck: JVD: I cannot appreciate JVD.  Cardiovascular: Normal rate.  An irregularly irregular rhythm present.  No murmur heard. Pulmonary/Chest: Effort normal. He has no rales.  Abdominal: Soft.  Musculoskeletal: He exhibits edema (2+ bilat ankle edema).  Neurological: He is alert and oriented to person, place, and time.  Skin: Skin is warm and dry.  Psychiatric: He has a normal mood and affect.    ASSESSMENT:    1. (HFpEF) heart failure with preserved ejection fraction (HCC)   2. Persistent atrial fibrillation (HCC)   3. ICD (implantable cardioverter-defibrillator) in place   4. Malignant neoplasm of base of tongue (HCC)    PLAN:    In order of problems listed above:  1. (HFpEF) heart failure with preserved ejection fraction (HCC) EF has improved to normal.  His most recent echocardiogram demonstrated normal ejection fraction.  His LE edema seems more c/w R sided heart failure.  His echo showed normal RVSF.  But, the PET scan suggested RV overload.  His BNP has been elevated and his BUN/Creatinine has remained normal in the setting of increased doses of Furosemide.  He still seems volume overloaded.  I think he may have more of a benefit from Torsemide.    -  DC Lasix  -  Start Torsemide 20 mg Once daily   -  BMET, Mg2+ today.   -  BMET, BNP in 1 week  2. Persistent atrial fibrillation (HCC)  Rate controlled.  Continue Coumadin.   3. ICD (implantable cardioverter-defibrillator) in place He should follow up with EP as planned. Of note, there was VF treated with ATP on his ICD in April. He had some NSVT in July.  Since I am increasing his diuresis, I will check his potassium and magnesium today.    4. Malignant neoplasm of base of  tongue (HCC)  He should follow up with oncology as planned.    Dispo:  Return in about 4 weeks (around 07/06/2017) for Close Follow Up, w/ Dr. Tenny Craw, or  Tereso Newcomer, PA-C.   Medication Adjustments/Labs and Tests Ordered: Current medicines are reviewed at length with the patient today.  Concerns regarding medicines are outlined above.  Tests Ordered: Orders Placed This Encounter  Procedures  . Pro b natriuretic peptide  . Basic metabolic panel  . Basic Metabolic Panel (BMET)  . Magnesium   Medication Changes: Meds ordered this encounter  Medications  . DISCONTD: torsemide (DEMADEX) 20 MG tablet    Sig: Take 1 tablet (20 mg total) by mouth daily.    Dispense:  14 tablet    Refill:  0  . torsemide (DEMADEX) 20 MG tablet    Sig: Take 1 tablet (20 mg total) by mouth daily.    Dispense:  90 tablet    Refill:  3    Signed, Tereso Newcomer, PA-C  06/08/2017 5:21 PM    [facility] [address] [place], [place], [place]  [address] Phone: [phone]; Fax: [phone]

## 2017-06-08 NOTE — Patient Instructions (Signed)
Medication Instructions:  1. STOP LASIX   2. START TORSEMIDE 20 MG DAILY; 2 WEEK SUPPLY SENT TO [facility], 90 DAYS SUPPLY SENT TO [facility]  Labwork:  1. TODAY BMET, MAGNESIUM LEVEL  2. IN 1 WEEK BMET,PRO BNP  Testing/Procedures: NONE ORDERED TODAY  Follow-Up: [name] IN 1 MONTH  Any Other Special Instructions Will Be Listed Below (If Applicable).     If you need a refill on your cardiac medications before your next appointment, please call your pharmacy.

## 2017-06-09 ENCOUNTER — Telehealth: Payer: Self-pay | Admitting: *Deleted

## 2017-06-09 LAB — BASIC METABOLIC PANEL
BUN/Creatinine Ratio: 29 — ABNORMAL HIGH (ref 10–24)
BUN: 19 mg/dL (ref 8–27)
CO2: 28 mmol/L (ref 20–29)
Calcium: 9.3 mg/dL (ref 8.6–10.2)
Chloride: 99 mmol/L (ref 96–106)
Creatinine, Ser: 0.65 mg/dL — ABNORMAL LOW (ref 0.76–1.27)
GFR calc Af Amer: 109 mL/min/{1.73_m2} (ref >59)
GFR calc non Af Amer: 95 mL/min/{1.73_m2} (ref >59)
Glucose: 95 mg/dL (ref 65–99)
Potassium: 4.2 mmol/L (ref 3.5–5.2)
Sodium: 140 mmol/L (ref 134–144)

## 2017-06-09 LAB — MAGNESIUM: Magnesium: 2.1 mg/dL (ref 1.6–2.3)

## 2017-06-09 NOTE — Telephone Encounter (Signed)
DPR ok to s/w pt's wife who has been notified of lab results for the pt by phone with verbal understanding. Pt's wife thanked me for the call today.

## 2017-06-09 NOTE — Telephone Encounter (Signed)
-----   Message from Liliane Shi, [place] sent at 06/09/2017  8:32 AM EDT ----- Please call patient: The kidney function (BUN, Creatinine), magnesium and potassium are normal. All other parameters are within acceptable limits and no further intervention or testing required. Continue with current treatment plan. [name], PA-C    06/09/2017 8:32 AM

## 2017-06-15 ENCOUNTER — Telehealth: Payer: Self-pay | Admitting: *Deleted

## 2017-06-15 ENCOUNTER — Other Ambulatory Visit: Payer: Medicare HMO | Admitting: *Deleted

## 2017-06-15 ENCOUNTER — Encounter: Payer: Self-pay | Admitting: *Deleted

## 2017-06-15 DIAGNOSIS — I5030 Unspecified diastolic (congestive) heart failure: Secondary | ICD-10-CM

## 2017-06-15 LAB — BASIC METABOLIC PANEL
BUN/Creatinine Ratio: 32 — ABNORMAL HIGH (ref 10–24)
BUN: 23 mg/dL (ref 8–27)
CO2: 28 mmol/L (ref 20–29)
Calcium: 9.2 mg/dL (ref 8.6–10.2)
Chloride: 99 mmol/L (ref 96–106)
Creatinine, Ser: 0.73 mg/dL — ABNORMAL LOW (ref 0.76–1.27)
GFR calc Af Amer: 104 mL/min/{1.73_m2} (ref >59)
GFR calc non Af Amer: 90 mL/min/{1.73_m2} (ref >59)
Glucose: 128 mg/dL — ABNORMAL HIGH (ref 65–99)
Potassium: 4 mmol/L (ref 3.5–5.2)
Sodium: 142 mmol/L (ref 134–144)

## 2017-06-15 LAB — PRO B NATRIURETIC PEPTIDE: NT-Pro BNP: 1015 pg/mL — ABNORMAL HIGH (ref 0–486)

## 2017-06-15 NOTE — Telephone Encounter (Signed)
-----   Message from Liliane Shi, [place] sent at 06/15/2017  4:29 PM EDT ----- Please call the patient Kidney function is normal.  The BNP is elevated. # How is his edema?         - If no or minimal improvement since starting Torsemide >> Increase Torsemide to 20 mg Twice daily and K+ to Twice daily             and repeat BMET in 1 week. [name], PA-C    06/15/2017 4:28 PM

## 2017-06-15 NOTE — Telephone Encounter (Signed)
DPR ok to s/w pt's wife who has been notified of lab results for the pt. Pt's wife states pt's left ankle is always swollen due to a collapsed ankle per pt's wife. She does state that the right leg/ankle are much better and the swelling has gone down. She denies any increased sob for the pt. I d/w PA who advised since pt is doing ok then do not increase Torsemide or K+ and will not need bmet in 1 week. Wife aware to continue on current Tx plan. Pt's wife thanked me for my call and our help today.

## 2017-06-15 NOTE — Progress Notes (Signed)
Oncology Nurse Navigator Documentation  Collin [name] and his wife attended the Fall 2018 [facility] survivorship series, 5 consecutive Tuesday evenings 6:00-7:15 pm beginning 05/17/17.  Gayleen Orem, RN, BSN, [place] Oncology Nurse [name] [phone]

## 2017-06-20 ENCOUNTER — Ambulatory Visit (INDEPENDENT_AMBULATORY_CARE_PROVIDER_SITE_OTHER): Payer: Medicare HMO | Admitting: *Deleted

## 2017-06-20 DIAGNOSIS — C140 Malignant neoplasm of pharynx, unspecified: Secondary | ICD-10-CM | POA: Diagnosis not present

## 2017-06-20 DIAGNOSIS — I428 Other cardiomyopathies: Secondary | ICD-10-CM

## 2017-06-20 NOTE — Progress Notes (Signed)
Remote ICD transmission.   

## 2017-06-22 ENCOUNTER — Ambulatory Visit (INDEPENDENT_AMBULATORY_CARE_PROVIDER_SITE_OTHER): Payer: Medicare HMO | Admitting: General Practice

## 2017-06-22 DIAGNOSIS — Z7901 Long term (current) use of anticoagulants: Secondary | ICD-10-CM | POA: Insufficient documentation

## 2017-06-22 DIAGNOSIS — Z5181 Encounter for therapeutic drug level monitoring: Secondary | ICD-10-CM

## 2017-06-22 DIAGNOSIS — I480 Paroxysmal atrial fibrillation: Secondary | ICD-10-CM

## 2017-06-22 LAB — POCT INR: INR: 1.5

## 2017-06-22 NOTE — Patient Instructions (Signed)
Pre visit review using our clinic review tool, if applicable. No additional management support is needed unless otherwise documented below in the visit note. 

## 2017-06-23 LAB — CUP PACEART REMOTE DEVICE CHECK
Battery Remaining Longevity: 144 mo
Battery Remaining Percentage: 100 %
Brady Statistic RV Percent Paced: 18 %
Date Time Interrogation Session: 20181015060100
HighPow Impedance: 44 Ohm
Implantable Lead Implant Date: 20060209
Implantable Lead Location: 753860
Implantable Lead Model: 158
Implantable Lead Serial Number: 156422
Implantable Pulse Generator Implant Date: 20150904
Lead Channel Impedance Value: 413 Ohm
Lead Channel Pacing Threshold Amplitude: 0.7 V
Lead Channel Pacing Threshold Pulse Width: 0.4 ms
Lead Channel Setting Pacing Amplitude: 2.4 V
Lead Channel Setting Pacing Pulse Width: 0.4 ms
Lead Channel Setting Sensing Sensitivity: 0.5 mV
Pulse Gen Serial Number: 191056

## 2017-06-24 ENCOUNTER — Encounter: Payer: Self-pay | Admitting: Cardiology

## 2017-06-24 NOTE — Progress Notes (Signed)
Letter  

## 2017-06-28 DIAGNOSIS — R590 Localized enlarged lymph nodes: Secondary | ICD-10-CM | POA: Diagnosis not present

## 2017-06-29 DIAGNOSIS — I428 Other cardiomyopathies: Secondary | ICD-10-CM | POA: Diagnosis not present

## 2017-06-29 DIAGNOSIS — C140 Malignant neoplasm of pharynx, unspecified: Secondary | ICD-10-CM | POA: Diagnosis not present

## 2017-06-30 ENCOUNTER — Other Ambulatory Visit: Payer: Self-pay | Admitting: Internal Medicine

## 2017-06-30 MED ORDER — POTASSIUM CHLORIDE ER 10 MEQ PO TBCR: EXTENDED_RELEASE_TABLET | ORAL | 3 refills | Status: DC

## 2017-06-30 MED ORDER — DILTIAZEM HCL 90 MG PO TABS: 90.0000 mg | ORAL_TABLET | Freq: Every day | ORAL | 3 refills | Status: DC

## 2017-07-04 ENCOUNTER — Ambulatory Visit (INDEPENDENT_AMBULATORY_CARE_PROVIDER_SITE_OTHER): Payer: Medicare HMO | Admitting: General Practice

## 2017-07-04 ENCOUNTER — Telehealth: Payer: Self-pay | Admitting: Internal Medicine

## 2017-07-04 DIAGNOSIS — I480 Paroxysmal atrial fibrillation: Secondary | ICD-10-CM | POA: Diagnosis not present

## 2017-07-04 DIAGNOSIS — Z7901 Long term (current) use of anticoagulants: Secondary | ICD-10-CM | POA: Diagnosis not present

## 2017-07-04 LAB — POCT INR: INR: 2

## 2017-07-04 NOTE — Telephone Encounter (Signed)
Spoke with patient and his wife and scheduled him to see [name] on Monday 11/5. They were thankful for the call.

## 2017-07-04 NOTE — Telephone Encounter (Signed)
Patient is due to have limited neck dissection  Need to know if needs to be seen or what about anticoaguatoin  WIll set up to see me to review  Pt needs edema addressed as well  Set up with me on November 5

## 2017-07-04 NOTE — Progress Notes (Signed)
I agree with this plan.

## 2017-07-04 NOTE — Patient Instructions (Signed)
Pre visit review using our clinic review tool, if applicable. No additional management support is needed unless otherwise documented below in the visit note. 

## 2017-07-05 ENCOUNTER — Telehealth: Payer: Self-pay | Admitting: Internal Medicine

## 2017-07-11 ENCOUNTER — Ambulatory Visit (INDEPENDENT_AMBULATORY_CARE_PROVIDER_SITE_OTHER): Payer: Medicare HMO | Admitting: Internal Medicine

## 2017-07-11 ENCOUNTER — Ambulatory Visit: Payer: Medicare HMO | Attending: Radiation Oncology

## 2017-07-11 ENCOUNTER — Encounter: Payer: Self-pay | Admitting: Internal Medicine

## 2017-07-11 VITALS — BP 120/68 | HR 74 | Ht 72.0 in | Wt 226.4 lb

## 2017-07-11 DIAGNOSIS — R1310 Dysphagia, unspecified: Secondary | ICD-10-CM

## 2017-07-11 DIAGNOSIS — I482 Chronic atrial fibrillation, unspecified: Secondary | ICD-10-CM

## 2017-07-11 DIAGNOSIS — I428 Other cardiomyopathies: Secondary | ICD-10-CM

## 2017-07-11 NOTE — Therapy (Signed)
Fax:  [phone]   Name: Collin [name] MRN: [id number] Date of Birth: Dec 11, 1940  Fax:  [phone]   Name: Collin [name] MRN: [id number] Date of Birth: Dec 11, 1940  [facility] [address] [place], [place], [address] Phone: [phone]   Fax:  [phone]  Speech Language Pathology Treatment  Patient Details  Name: Collin [name] MRN: [id number] Date of Birth: 1941-04-25 Referring Provider: Eppie Gibson MD   Encounter Date: 07/11/2017  End of Session - 07/11/17 1439    Visit Number  7    Number of Visits  7    Date for SLP Re-Evaluation  07/01/17    SLP Start Time  1113    SLP Stop Time   1153    SLP Time Calculation (min)  40 min    Activity Tolerance  Patient tolerated treatment well       Past Medical History:  Diagnosis Date  . Atrial fibrillation ([place])   . Cardiomyopathy, nonischemic ([place]) 1998  . Diabetes mellitus   . Heart disease   . History of echocardiogram    Echo 8/18:  EF 55-60, no RWMA, severe LAE, normal RVSF, mild RAE  . History of radiation therapy 12/07/16- 01/24/17   Base of Tongue 70 Gy in 35 fractions  . Hyperlipidemia   . OSA (obstructive sleep apnea)   . Shingles   . 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 It sometimes shows as a shadow on scans.     Past Surgical History:  Procedure Laterality Date  . CARDIAC DEFIBRILLATOR PLACEMENT  2006  . CATARACT EXTRACTION Bilateral    01/07/2016. 01/22/2016  . CYST EXCISION     multiple drainage to a cyst in his neck.   Marland Kitchen HERNIA REPAIR     1950's  . IR GASTROSTOMY TUBE MOD SED  12/27/2016  . IR PATIENT EVAL TECH 0-60 MINS  01/17/2017  . MOHS SURGERY  01/01/2015   to top of head  . MOUTH SURGERY     teeth removed 4 teeth extracted 02/2010    There were no vitals filed for this visit.  Subjective Assessment - 07/11/17 1118    Subjective  Pt cont to lose weight. Eating pureed veggies, yogurt/fruit drinks, and some other dys I-II items.    Patient is accompained by:  Family member wife   wife   Currently in Pain?  Yes    Pain Location  Arm    Pain Orientation  Left;Right    Pain Descriptors / Indicators  Aching    Pain Type  Acute pain    Pain Onset  In the past 7 days    Pain Frequency  Intermittent    Aggravating Factors   movement    Pain Relieving Factors  resting            ADULT SLP TREATMENT - 07/11/17 1120      General Information   Behavior/Cognition  Alert;Cooperative;Pleasant mood      Treatment Provided   Treatment provided  Dysphagia      Dysphagia Treatment   Temperature Spikes Noted  No    Respiratory Status  Room air    Treatment Methods  Skilled observation;Therapeutic exercise;Patient/caregiver education    Patient observed directly with PO's  Yes    Type of PO's observed  Thin liquids;Dysphagia 1 (puree)    Liquids provided via  Cup    Oral Phase Signs & Symptoms  -- none noted/reported   none noted/reported   Pharyngeal Phase Signs & Symptoms  Complaints of residue;Multiple swallows pt head turn to rt allevaiated residue, per  Fax:  [phone]   Name: Collin [name] MRN: [id number] Date of Birth: Dec 11, 1940

## 2017-07-11 NOTE — Patient Instructions (Signed)
Your physician recommends that you continue on your current medications as directed. Please refer to the Current Medication list given to you today.  Your physician recommends that you return for lab work today (BMET)  Your physician recommends that you schedule a follow-up appointment in: March, 2019 with [name].

## 2017-07-11 NOTE — Progress Notes (Signed)
Cardiology Office Note:    Date:  07/11/2017   ID:  Collin [name], DOB 12-05-1940, MRN [id number]  PCP:  Collin Covey, MD  Cardiologist:  [name]   Electrophysiologist: [name]    Referring MD: Collin Covey, MD   F/U of chronic systolic CHF  History of Present Illness:    Collin [name] is a 76 y.o. male with a hx of  NICM, systolic heart failure, atrial fibrillation, diabetes, hyperlipidemia, sleep apnea.  He moved to [place] from [place] in 2011. He has a history of normal coronary arteries by cardiac catheterization in 2002. Prior EF was 28%. He is status post ICD and is followed by Dr. Ladona [name].  Echocardiogram in 2015 demonstrated normal LV function with an EF of 55-60%.  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  He has a feeding tube.  He was seen by Collin [name] in October  Diuretic adjusted    Pt denies CP     Prior CV studies:   The following studies were reviewed today:  PET Scan 05/17/17 IMPRESSION: 1. Response to therapy. 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 2. No evidence of extracervical metastatic disease. 3. Heterogeneous hepatic activity is nonspecific. Concurrent caudate lobe enlargement and gynecomastia. Correlate with risk factors for cirrhosis. Consider dedicated abdominal imaging, ideally with pre and post contrast abdominal MRI. 4. Cardiomegaly with suspicion of elevated right heart pressures. Pulmonary artery enlargement suggests pulmonary arterial hypertension. 5. Coronary artery atherosclerosis. Aortic Atherosclerosis (ICD10-I70.0). 6. Cholelithiasis. 7. Prostatomegaly with bladder outlet obstruction. 8. Fat containing ventral pelvic wall hernia. 9. Pulmonary artery enlargement suggests pulmonary arterial Hypertension.  Echocardiogram 04/28/17 EF 55-60, normal wall motion, severe LAE, normal RVSF, mild RAE  LE venous US 03/24/17 No  evidence of lower extremity deep or superficial venous thrombus or incompetence, bilaterally.  Echo 04/22/14 Mod conc LVH, EF 55-60, no RWMA, mildly dilated ascending aorta (root 40 mm, ascending aorta 37 mm), severe LAE, severe RAE, lipoma hypertrophy of the Atrial septum, mild TR  Echo 04/17/13 Moderate LVH, EF 35-40, diffuse HK, moderate to severe LAE, mild RAE  Echo 5/12 Moderate LVH, EF 50-55, moderate LAE, mild RAE  Past Medical History:  Diagnosis Date  . Atrial fibrillation (HCC)   . Cardiomyopathy, nonischemic (HCC) 1998  . Diabetes mellitus   . Heart disease   . History of echocardiogram    Echo 8/18:  EF 55-60, no RWMA, severe LAE, normal RVSF, mild RAE  . History of radiation therapy 12/07/16- 01/24/17   Base of Tongue 70 Gy in 35 fractions  . Hyperlipidemia   . OSA (obstructive sleep apnea)   . Shingles   . 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 It sometimes shows as a shadow on scans.     Past Surgical History:  Procedure Laterality Date  . CARDIAC DEFIBRILLATOR PLACEMENT  2006  . CATARACT EXTRACTION Bilateral    01/07/2016. 01/22/2016  . CYST EXCISION     multiple drainage to a cyst in his neck.   Collin [name] HERNIA REPAIR     1950's  . IR GASTROSTOMY TUBE MOD SED  12/27/2016  . IR PATIENT EVAL TECH 0-60 MINS  01/17/2017  . MOHS SURGERY  01/01/2015   to top of head  . MOUTH SURGERY     teeth removed 4 teeth extracted 02/2010    Current  Medications: Current Meds  Medication Sig  . acetaminophen (TYLENOL) 500 MG tablet Take 1,000 mg by mouth every 6 (six) hours as needed for moderate pain.  . carvedilol (COREG) 25 MG tablet TAKE 1 TABLET TWICE DAILY WITH MEALS  . digoxin (LANOXIN) 0.125 MG tablet Take by mouth daily.  Collin [name] diltiazem (CARDIZEM) 90 MG tablet Take 1 tablet (90 mg total) by mouth daily.  . Nutritional Supplements (FEEDING SUPPLEMENT, OSMOLITE 1.5 CAL,) LIQD Give 2 cans of formula at 8am and 12 noon. Give  additional 1 1/2 cans at 4pm and 8pm feeding.   Flush with 60ml of water before and after feeding 4 times per day  . Nutritional Supplements (PROMOD) LIQD Give 30ml (1 oz) 2 times per day via tube. Flush with 60ml of water before and after given.  . polyethylene glycol (MIRALAX) packet Take 17 g by mouth daily as needed. Available OTC  . potassium chloride (K-DUR) 10 MEQ tablet Dissolve 1 tablet in water and administer via PEG tube daily  . sodium fluoride (PREVIDENT 5000 PLUS) 1.1 % CREA dental cream Apply gel to tooth brush. Brush teeth for 2 minutes. Spit out excess-DO NOT swallow. Repeat nightly.  . torsemide (DEMADEX) 20 MG tablet Take 1 tablet (20 mg total) by mouth daily.  Collin [name] warfarin (COUMADIN) 5 MG tablet TAKE AS DIRECTED BY ANTICOAGULATION CLINIC.     Allergies:   Niaspan [niacin er]   Social History   Socioeconomic History  . Marital status: Married    Spouse name: Collin [name]  . Number of children: 3  . Years of education: None  . Highest education level: None  Social Needs  . Financial resource strain: None  . Food insecurity - worry: None  . Food insecurity - inability: None  . Transportation needs - medical: None  . Transportation needs - non-medical: None  Occupational History  . Occupation: Retired Engineer, materials  Tobacco Use  . Smoking status: Never Smoker  . Smokeless tobacco: Never Used  Substance and Sexual Activity  . Alcohol use: Yes    Comment: he has a past history of social drinking  . Drug use: No  . Sexual activity: None  Other Topics Concern  . None  Social History Narrative  . None     Family Hx: The patient's family history includes Heart disease in his mother; Hypertension in his father; Lupus in his mother; Stroke in his father; Sudden death in his mother.  ROS:    EKGs/Labs/Other Test Reviewed:    EKG:  EKG is not ordered today.  The ekg ordered today demonstrates n/a  Recent Labs: 09/16/2016: BNP CANCELED 12/27/2016: Hemoglobin 13.3;  Platelets 227 03/11/2017: ALT 21; TSH 1.360 06/08/2017: Magnesium 2.1 06/15/2017: BUN 23; Creatinine, Ser 0.73; NT-Pro BNP 1,015; Potassium 4.0; Sodium 142   Recent Lipid Panel Lab Results  Component Value Date/Time   CHOL 156 09/16/2016 02:44 PM   TRIG 324 (H) 09/16/2016 02:44 PM   HDL 42 09/16/2016 02:44 PM   CHOLHDL 3.7 09/16/2016 02:44 PM   CHOLHDL 5 11/13/2015 03:25 PM   LDLCALC 49 09/16/2016 02:44 PM   LDLDIRECT 72.0 11/13/2015 03:25 PM    Physical Exam:    VS:  BP 120/68   Pulse 74   Ht 6' (1.829 m)   Wt 226 lb 6.4 oz (102.7 kg)   SpO2 97%   BMI 30.71 kg/m       Neck:  JVP normal  Lungs Clear   Ccardiac Irreg Irreg  No signif murmurs  No S3    Abdomen Supple  No hepatommegaly Ext  Tr edema   Wt Readings from Last 3 Encounters:  07/11/17 226 lb 6.4 oz (102.7 kg)  06/08/17 234 lb 1.9 oz (106.2 kg)  05/25/17 232 lb (105.2 kg)       ASSESSMENT:    No diagnosis found. PLAN:      1. (HFpEF) heart failure with preserved ejection fraction (HCC) EF has improved to normal.  His most recent echocardiogram demonstrated normal ejection fraction.   Keep on same regimen  Volume not bad.  Check BMET  2. Persistent atrial fibrillation (HCC)  Rate control and coumadin  3. ICD (implantable cardioverter-defibrillator) in place  4. Malignant neoplasm of base of tongue (HCC)  Followed in oncology    Dispo:  F/U in March 2019  Medication Adjustments/Labs and Tests Ordered: Current medicines are reviewed at length with the patient today.  Concerns regarding medicines are outlined above.  Tests Ordered: No orders of the defined types were placed in this encounter.  Medication Changes: No orders of the defined types were placed in this encounter.   Signed, Dietrich Pates, MD  07/11/2017 2:06 PM    [facility] [address] [place], [place], [place]  [address] Phone: [phone]; Fax: [phone]

## 2017-07-12 LAB — BASIC METABOLIC PANEL
BUN/Creatinine Ratio: 32 — ABNORMAL HIGH (ref 10–24)
BUN: 23 mg/dL (ref 8–27)
CO2: 29 mmol/L (ref 20–29)
Calcium: 9.3 mg/dL (ref 8.6–10.2)
Chloride: 98 mmol/L (ref 96–106)
Creatinine, Ser: 0.72 mg/dL — ABNORMAL LOW (ref 0.76–1.27)
GFR calc Af Amer: 105 mL/min/{1.73_m2} (ref >59)
GFR calc non Af Amer: 91 mL/min/{1.73_m2} (ref >59)
Glucose: 144 mg/dL — ABNORMAL HIGH (ref 65–99)
Potassium: 3.9 mmol/L (ref 3.5–5.2)
Sodium: 142 mmol/L (ref 134–144)

## 2017-07-21 ENCOUNTER — Encounter (HOSPITAL_COMMUNITY): Payer: Self-pay

## 2017-07-21 ENCOUNTER — Ambulatory Visit: Payer: Self-pay | Admitting: Otolaryngology

## 2017-07-21 ENCOUNTER — Encounter (HOSPITAL_COMMUNITY)
Admission: RE | Admit: 2017-07-21 | Discharge: 2017-07-21 | Disposition: A | Discharge status: Home / Self Care | Payer: Medicare HMO | Source: Ambulatory Visit | Attending: Otolaryngology | Admitting: Otolaryngology

## 2017-07-21 ENCOUNTER — Other Ambulatory Visit: Payer: Self-pay

## 2017-07-21 DIAGNOSIS — Z85810 Personal history of malignant neoplasm of tongue: Secondary | ICD-10-CM | POA: Diagnosis not present

## 2017-07-21 DIAGNOSIS — Z95810 Presence of automatic (implantable) cardiac defibrillator: Secondary | ICD-10-CM | POA: Insufficient documentation

## 2017-07-21 DIAGNOSIS — I429 Cardiomyopathy, unspecified: Secondary | ICD-10-CM | POA: Insufficient documentation

## 2017-07-21 DIAGNOSIS — Z01812 Encounter for preprocedural laboratory examination: Secondary | ICD-10-CM | POA: Insufficient documentation

## 2017-07-21 DIAGNOSIS — I4891 Unspecified atrial fibrillation: Secondary | ICD-10-CM | POA: Diagnosis not present

## 2017-07-21 DIAGNOSIS — G4733 Obstructive sleep apnea (adult) (pediatric): Secondary | ICD-10-CM | POA: Insufficient documentation

## 2017-07-21 DIAGNOSIS — I509 Heart failure, unspecified: Secondary | ICD-10-CM | POA: Diagnosis not present

## 2017-07-21 DIAGNOSIS — Z79899 Other long term (current) drug therapy: Secondary | ICD-10-CM | POA: Insufficient documentation

## 2017-07-21 DIAGNOSIS — Z7901 Long term (current) use of anticoagulants: Secondary | ICD-10-CM | POA: Insufficient documentation

## 2017-07-21 DIAGNOSIS — E119 Type 2 diabetes mellitus without complications: Secondary | ICD-10-CM | POA: Diagnosis not present

## 2017-07-21 DIAGNOSIS — E785 Hyperlipidemia, unspecified: Secondary | ICD-10-CM | POA: Diagnosis not present

## 2017-07-21 HISTORY — DX: Claustrophobia: F40.240

## 2017-07-21 HISTORY — DX: Malignant (primary) neoplasm, unspecified: C80.1

## 2017-07-21 HISTORY — DX: Presence of automatic (implantable) cardiac defibrillator: Z95.810

## 2017-07-21 HISTORY — DX: Cardiac arrhythmia, unspecified: I49.9

## 2017-07-21 HISTORY — DX: Heart failure, unspecified: I50.9

## 2017-07-21 HISTORY — DX: Umbilical hernia without obstruction or gangrene: K42.9

## 2017-07-21 LAB — BASIC METABOLIC PANEL
Anion gap: 6 (ref 5–15)
BUN: 24 mg/dL — ABNORMAL HIGH (ref 6–20)
CO2: 30 mmol/L (ref 22–32)
Calcium: 9.2 mg/dL (ref 8.9–10.3)
Chloride: 103 mmol/L (ref 101–111)
Creatinine, Ser: 0.67 mg/dL (ref 0.61–1.24)
GFR calc Af Amer: >60 mL/min (ref >60)
GFR calc non Af Amer: >60 mL/min (ref >60)
Glucose, Bld: 108 mg/dL — ABNORMAL HIGH (ref 65–99)
Potassium: 4.1 mmol/L (ref 3.5–5.1)
Sodium: 139 mmol/L (ref 135–145)

## 2017-07-21 LAB — CBC
HCT: 40.2 % (ref 39.0–52.0)
Hemoglobin: 13.2 g/dL (ref 13.0–17.0)
MCH: 31.2 pg (ref 26.0–34.0)
MCHC: 32.8 g/dL (ref 30.0–36.0)
MCV: 95 fL (ref 78.0–100.0)
Platelets: 170 10*3/uL (ref 150–400)
RBC: 4.23 MIL/uL (ref 4.22–5.81)
RDW: 14.3 % (ref 11.5–15.5)
WBC: 3.8 10*3/uL — ABNORMAL LOW (ref 4.0–10.5)

## 2017-07-21 NOTE — H&P (Signed)
PREOPERATIVE H&P  Chief Complaint: Right neck mass  HPI: Collin [name] is a 76 y.o. male who presents for evaluation of right neck mass. 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 He had a posttreatment PET scan performed 3 months ago that demonstrated some persistent mild activity in the right neck. He has persistent enlarged right neck cyst that are causing him some discomfort. The right neck masses have not changed significantly in the past 2 months and measure 3-4 cm in size. He would like to have these removed. I discussed right neck dissection with him and his wife and they would like to proceed with this. He has history of chronic A. fib and is on Coumadin. He has been cleared by his cardiologist for surgery and stopping the Coumadin for 5 days prior to surgery. He's taken to the operating room at this time for modified right neck dissection.  Past Medical History:  Diagnosis Date  . AICD (automatic cardioverter/defibrillator) present   . Atrial fibrillation ([place])   . Cancer (HCC)    tongue cancer  . Cardiomyopathy, nonischemic ([place]) 1998  . CHF (congestive heart failure) ([place])   . Claustrophobia   . Diabetes mellitus   . Dysrhythmia   . Heart disease   . History of echocardiogram    Echo 8/18:  EF 55-60, no RWMA, severe LAE, normal RVSF, mild RAE  . History of radiation therapy 12/07/16- 01/24/17   Base of Tongue 70 Gy in 35 fractions  . Hyperlipidemia   . OSA (obstructive sleep apnea)   . Shingles   . Umbilical hernia   . 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 It sometimes shows as a shadow on scans.    Past Surgical History:  Procedure Laterality Date  . CARDIAC DEFIBRILLATOR PLACEMENT  2006  . CATARACT EXTRACTION Bilateral    01/07/2016. 01/22/2016  . CYST EXCISION     multiple drainage to a cyst in his neck.   Marland Kitchen DIRECT LARYNGOSCOPY N/A  11/16/2016   Procedure: DIRECT LARYNGOSCOPY AND BIOPSY;  Surgeon: Rozetta Nunnery, MD;  Location: [facility];  Service: ENT;  Laterality: N/A;  . EYE SURGERY    . HERNIA REPAIR     1950's  . IMPLANTABLE CARDIOVERTER DEFIBRILLATOR (ICD) GENERATOR CHANGE N/A 05/10/2014   Procedure: ICD GENERATOR CHANGE;  Surgeon: [name], MD;  [facility];  Service: Cardiovascular;  Laterality: N/A;  . IR GASTROSTOMY TUBE MOD SED  12/27/2016  . IR PATIENT EVAL TECH 0-60 MINS  01/17/2017  . MASS EXCISION Right 11/16/2016   Procedure: EXCISION RIGHT NECK LIPOMA;  Surgeon: Rozetta Nunnery, MD;  Location: [facility];  Service: ENT;  Laterality: Right;  . MOHS SURGERY  01/01/2015   to top of head  . MOUTH SURGERY     teeth removed 4 teeth extracted 02/2010   Social History   Socioeconomic History  . Marital status: Married    Spouse name: [name]  . Number of children: 3  . Years of education: Not on file  . Highest education level: Not on file  Social Needs  . Financial resource strain: Not on file  . Food insecurity - worry: Not on file  . Food insecurity - inability: Not on file  . Transportation needs - medical: Not on file  . Transportation needs - non-medical: Not on  file  Occupational History  . Occupation: Retired Animal nutritionist  Tobacco Use  . Smoking status: Never Smoker  . Smokeless tobacco: Never Used  Substance and Sexual Activity  . Alcohol use: Yes    Comment: he has a past history of social drinking  . Drug use: No  . Sexual activity: Not on file  Other Topics Concern  . Not on file  Social History Narrative  . Not on file   Family History  Problem Relation Age of Onset  . Heart disease Mother   . Sudden death Mother        under age 23  . Lupus Mother   . Hypertension Father   . Stroke Father    Allergies  Allergen Reactions  . Niaspan [Niacin Er] Other (See Comments)    REACTION NOT AN ALLERGY >  FLUSHED, EXPECTED W/NIACIN   Prior to Admission  medications   Medication Sig Start Date End Date Taking? Authorizing Provider  acetaminophen (TYLENOL) 500 MG tablet Take 1,000 mg by mouth every 6 (six) hours as needed for moderate pain.   Yes [provider]  carvedilol (COREG) 25 MG tablet TAKE 1 TABLET TWICE DAILY WITH MEALS Patient taking differently: Take 25 mg by mouth twice daily 02/02/17  Yes Burchette, Alinda Sierras, MD  digoxin (LANOXIN) 0.125 MG tablet Take 0.125 mg daily by mouth.    Yes [provider]  diltiazem (CARDIZEM) 90 MG tablet Take 1 tablet (90 mg total) by mouth daily. 06/30/17  Yes Fay Records, MD  Nutritional Supplements (FEEDING SUPPLEMENT, OSMOLITE 1.5 CAL,) LIQD Give 2 cans of formula at 8am and 12 noon. Give additional 1 1/2 cans at 4pm and 8pm feeding.   Flush with 40ml of water before and after feeding 4 times per day Patient taking differently: Place 355-574 mLs See admin instructions into feeding tube. Give 2 cans of formula at 8am and 12 noon. Give additional 1 1/2 cans at 4pm and 8pm feeding.   Flush with 12ml of water before and after feeding 4 times per day 12/28/16  Yes Eppie Gibson, MD  Nutritional Supplements (PROMOD) LIQD Give 57ml (1 oz) 2 times per day via tube. Flush with 5ml of water before and after given. Patient taking differently: Place 30 mLs See admin instructions into feeding tube. Give 4ml (1 oz) once per day via tube. Flush with 43ml of water before and after given. 12/28/16  Yes Eppie Gibson, MD  polyethylene glycol [facility]) packet Take 17 g by mouth daily as needed. Available OTC Patient taking differently: Take 17 g daily as needed by mouth for moderate constipation.  11/17/16  Yes Rai, Ripudeep K, MD  potassium chloride (K-DUR) 10 MEQ tablet Dissolve 1 tablet in water and administer via PEG tube daily Patient taking differently: Take 10 mEq See admin instructions by mouth. Dissolve 10 meq in water and administer via PEG tube daily 06/30/17  Yes Fay Records, MD  sodium  fluoride (PREVIDENT 5000 PLUS) 1.1 % CREA dental cream Apply gel to tooth brush. Brush teeth for 2 minutes. Spit out excess-DO NOT swallow. Repeat nightly. Patient taking differently: Place 1 application daily onto teeth. Apply gel to tooth brush. Brush teeth for 2 minutes. Spit out excess-DO NOT swallow. Repeat nightly. 12/16/16  Yes Lenn Cal, DDS  torsemide (DEMADEX) 20 MG tablet Take 1 tablet (20 mg total) by mouth daily. 06/08/17 09/06/17 Yes Weaver, Scott T, PA-C  warfarin (COUMADIN) 5 MG tablet TAKE AS DIRECTED BY ANTICOAGULATION  CLINIC. Patient taking differently: Take 5-7.5 mg See admin instructions by mouth. Take 7.5 mg by mouth daily on Wednesday. Take 5 mg by mouth daily on all other days 05/17/17  Yes Burchette, Alinda Sierras, MD     Positive ROS: He is having no sore throat. On clinical exam no evidence of persistent disease in the base of tongue. 21  All other systems have been reviewed and were otherwise negative with the exception of those mentioned in the HPI and as above.  Physical Exam: There were no vitals filed for this visit.  General: Alert, no acute distress Oral: Normal oral mucosa and tonsils. IDL clear BOT.( Nasal: Clear nasal passages Neck: 2 large mobile right neck nodes 3-4 cm size.no left neck adenopathy.  Ear: Ear canal is clear with normal appearing TMs Cardiovascular: IRRR  Respiratory: Clear to auscultation Neurologic: Alert and oriented x 3   Assessment/Plan: right neck cancer Plan for Procedure(s): RIGHT NECK DISSECTION   Melony Overly, MD 07/21/2017 4:34 PM

## 2017-07-21 NOTE — Progress Notes (Signed)
PCP: Dr. Elease Hashimoto saw last 05/25/2017 Cardiologist: [name], last saw 07/11/2017  EKG: 04/25/2017 ECHO: 04/28/2017 Stress Test: Denies Cardiac Cath: Denies  Pt has hx of Diabetes, no longer takes metformin since he lost 70 lbs d/t radiation tx and tongue cancer.  Last A1C 6.3.  CBG at PAT appt, 118.   Limited ability to swallow, eats solids and supplements with Osmolite Tube feeds.  Crushes meds and administers via tube.  Encouraged to use just enough water with meds DOS to clear tube and not to clog tube.  NPO after MN including tube feeds, verbalized understanding.   Mass on right side of neck, called Angela NP during appt to asess airway.  Pt has [place], faxed device programming request to Dr. Lovena Le. Called Joey, rep for [employer], and made aware of pt's surgery.  Joey stated to please call by 8:30am DOS if he is needed. [phone].  Patient denies shortness of breath, fever, cough, and chest pain at PAT appointment.  Patient verbalized understanding of instructions provided today at the PAT appointment.  Patient asked to review instructions at home and day of surgery.

## 2017-07-21 NOTE — Pre-Procedure Instructions (Signed)
Collin [name]  07/21/2017      [facility] [address], [place] [address] Phone: [phone] Fax: [phone]    Your procedure is scheduled on Monday July 25, 2017.  Report to [facility] Admitting at 07:00 A.M.  Call this number if you have problems the morning of surgery:  [phone]   Remember:  Do not eat food or drink liquids after midnight.  Take these medicines the morning of surgery with A SIP OF WATER :  Carvedilol (Coreg) Digoxin (Lanoxin) Diltiazem (Cardizem)  Ask your Doctor about your Coumadin  Starting TODAY, STOP taking any Aspirin(unless otherwise instructed by your surgeon), Aleve, Naproxen, Ibuprofen, Motrin, Advil, Goody's, BC's, all herbal medications, fish oil, and all vitamins    Do not wear jewelry.  Do not wear lotions, powders, or perfumes, or deoderant.  Do not shave 48 hours prior to surgery.  Men may shave face and neck.  Do not bring valuables to the hospital.   [facility] is not responsible for any belongings or valuables.  Contacts, dentures or bridgework may not be worn into surgery.  Leave your suitcase in the car.  After surgery it may be brought to your room.  For patients admitted to the hospital, discharge time will be determined by your treatment team.  Patients discharged the day of surgery will not be allowed to drive home.   Special instructions:   [name]  Before surgery, you can play an important role. Because skin is not sterile, your skin needs to be as free of germs as possible. You can reduce the number of germs on your skin by washing with CHG (chlorahexidine gluconate) Soap before surgery.  CHG is an antiseptic cleaner which kills germs and bonds with the skin to continue killing germs even after washing.  Please do not use if you have an allergy to CHG or antibacterial soaps. If your skin  becomes reddened/irritated stop using the CHG.  Do not shave (including legs and underarms) for at least 48 hours prior to first CHG shower. It is OK to shave your face.  Please follow these instructions carefully.   1. Shower the NIGHT BEFORE SURGERY and the MORNING OF SURGERY with CHG.   2. If you chose to wash your hair, wash your hair first as usual with your normal shampoo.  3. After you shampoo, rinse your hair and body thoroughly to remove the shampoo.  4. Use CHG as you would any other liquid soap. You can apply CHG directly to the skin and wash gently with a scrungie or a clean washcloth.   5. Apply the CHG Soap to your body ONLY FROM THE NECK DOWN.  Do not use on open wounds or open sores. Avoid contact with your eyes, ears, mouth and genitals (private parts). Wash Face and genitals (private parts)  with your normal soap.  6. Wash thoroughly, paying special attention to the area where your surgery will be performed.  7. Thoroughly rinse your body with warm water from the neck down.  8. DO NOT shower/wash with your normal soap after using and rinsing off the CHG Soap.  9. Pat yourself dry with a CLEAN TOWEL.  10. Wear CLEAN PAJAMAS to bed the night before surgery, wear comfortable clothes the morning of surgery  11. Place CLEAN SHEETS on your bed the night of your first shower and DO NOT SLEEP WITH  PETS.    Day of Surgery: Do not apply any deodorants/lotions. Please wear clean clothes to the hospital/surgery center.      Please read over the following fact sheets that you were given. Surgical Site Infection Prevention

## 2017-07-22 NOTE — Progress Notes (Signed)
Anesthesia Chart Review:  Pt is a 76 year old male scheduled for R neck dissection on 07/25/2017 with Collin Overly, MD  - PCP is Collin Littler, MD - Cardiologist is Collin Carnes, MD. Last office visit 07/11/17 - EP cardiologist is Collin Peru, MD  PMH includes:  CHF, nonischemic cardiomyopathy, AICD [facility]), atrial fibrillation, DM (diet controlled), hyperlipidemia, OSA, tongue cancer. Never smoker. BMI 30. S/p direct laryngoscopy and biopsy, excision R neck lipoma 11/16/16  - Pt has a feeding tube, has limited swallowing ability. I evaluated airway at pre-admission testing; I do not anticipate airway issues. See anesthesia record 11/16/16  Medications include: Carvedilol, digitoxin, diltiazem, potassium, torsemide, Coumadin. Pt stopped coumadin 07/22/17.   BP 104/76   Pulse 70   Temp 36.8 C   Resp 20   Ht 6' 0.5" (1.842 m)   Wt 223 lb 1.6 oz (101.2 kg)   SpO2 99%   BMI 29.84 kg/m   Preoperative labs reviewed.   - Glucose 108. HbA1c was 6.3 on 05/25/17 - PT/INR will be obtained day of surgery  1 view CXR 11/11/16:  1. Enteric tube with single loop in the neck. Tip not imaged on this study, see separate abdominal radiograph report. 2. No evidence of active cardiopulmonary disease.  EKG 04/25/17: Atrial fibrillation (73 bpm). Transient V pacing.   Echo 04/28/17:  - Left ventricle: Systolic function was normal. The estimated ejection fraction was in the range of 55% to 60%. Wall motion was normal; there were no regional wall motion abnormalities. - Left atrium: Severely dilated. - Right ventricle: The cavity size was normal. Wall thickness was normal. AICD wire noted in right ventricle. Systolic function was normal. - Right atrium: The atrium was mildly dilated. AICD wire noted in right atrium. - Atrial septum: No defect or patent foramen ovale was identified. - Inferior vena cava: The vessel was normal in size. The respirophasic diameter changes were in the normal  range (>= 50%), consistent with normal central venous pressure.  Perioperative prescription for AICD notes procedure will likely interfere with device function. Device should be programmed tachycardia therapies disabled  If PT acceptable day of surgery, I anticipate pt can proceed with surgery as scheduled.   Collin Cass, FNP-BC [facility] Short Stay Surgical Center/Anesthesiology Phone: [phone] 07/22/2017 9:56 AM  - Impressions: Compared to a prior study in 2015, there are no significant changes.

## 2017-07-23 NOTE — Progress Notes (Signed)
Spoke to [employer] with [employer] he is aware that we a rep to reprogram device.

## 2017-07-25 ENCOUNTER — Inpatient Hospital Stay (HOSPITAL_COMMUNITY): Payer: Medicare HMO | Admitting: Emergency Medicine

## 2017-07-25 ENCOUNTER — Inpatient Hospital Stay (HOSPITAL_COMMUNITY): Payer: Medicare HMO | Admitting: Anesthesiology

## 2017-07-25 ENCOUNTER — Encounter (HOSPITAL_COMMUNITY)
Admission: RE | Disposition: A | Discharge status: Home / Self Care | Payer: Self-pay | Source: Ambulatory Visit | Attending: Otolaryngology

## 2017-07-25 ENCOUNTER — Encounter (HOSPITAL_COMMUNITY): Payer: Self-pay | Admitting: *Deleted

## 2017-07-25 ENCOUNTER — Inpatient Hospital Stay (HOSPITAL_COMMUNITY)
Admission: RE | Admit: 2017-07-25 | Discharge: 2017-07-26 | DRG: 803 | Disposition: A | Discharge status: Home / Self Care | Payer: Medicare HMO | Source: Ambulatory Visit | Attending: Otolaryngology | Admitting: Otolaryngology

## 2017-07-25 ENCOUNTER — Other Ambulatory Visit: Payer: Self-pay

## 2017-07-25 DIAGNOSIS — Z8249 Family history of ischemic heart disease and other diseases of the circulatory system: Secondary | ICD-10-CM

## 2017-07-25 DIAGNOSIS — Z85810 Personal history of malignant neoplasm of tongue: Secondary | ICD-10-CM | POA: Diagnosis not present

## 2017-07-25 DIAGNOSIS — E785 Hyperlipidemia, unspecified: Secondary | ICD-10-CM | POA: Diagnosis present

## 2017-07-25 DIAGNOSIS — Z95810 Presence of automatic (implantable) cardiac defibrillator: Secondary | ICD-10-CM | POA: Diagnosis not present

## 2017-07-25 DIAGNOSIS — Z931 Gastrostomy status: Secondary | ICD-10-CM

## 2017-07-25 DIAGNOSIS — G4733 Obstructive sleep apnea (adult) (pediatric): Secondary | ICD-10-CM | POA: Diagnosis present

## 2017-07-25 DIAGNOSIS — R221 Localized swelling, mass and lump, neck: Secondary | ICD-10-CM | POA: Diagnosis present

## 2017-07-25 DIAGNOSIS — I482 Chronic atrial fibrillation: Secondary | ICD-10-CM | POA: Diagnosis present

## 2017-07-25 DIAGNOSIS — Z9842 Cataract extraction status, left eye: Secondary | ICD-10-CM

## 2017-07-25 DIAGNOSIS — Z7901 Long term (current) use of anticoagulants: Secondary | ICD-10-CM | POA: Diagnosis not present

## 2017-07-25 DIAGNOSIS — D487 Neoplasm of uncertain behavior of other specified sites: Secondary | ICD-10-CM | POA: Diagnosis not present

## 2017-07-25 DIAGNOSIS — Z9841 Cataract extraction status, right eye: Secondary | ICD-10-CM

## 2017-07-25 DIAGNOSIS — Z9221 Personal history of antineoplastic chemotherapy: Secondary | ICD-10-CM

## 2017-07-25 DIAGNOSIS — C760 Malignant neoplasm of head, face and neck: Secondary | ICD-10-CM | POA: Diagnosis not present

## 2017-07-25 DIAGNOSIS — E119 Type 2 diabetes mellitus without complications: Secondary | ICD-10-CM | POA: Diagnosis present

## 2017-07-25 DIAGNOSIS — C01 Malignant neoplasm of base of tongue: Secondary | ICD-10-CM | POA: Diagnosis not present

## 2017-07-25 DIAGNOSIS — I898 Other specified noninfective disorders of lymphatic vessels and lymph nodes: Secondary | ICD-10-CM | POA: Diagnosis not present

## 2017-07-25 DIAGNOSIS — I5022 Chronic systolic (congestive) heart failure: Secondary | ICD-10-CM | POA: Diagnosis not present

## 2017-07-25 DIAGNOSIS — Z8589 Personal history of malignant neoplasm of other organs and systems: Secondary | ICD-10-CM

## 2017-07-25 DIAGNOSIS — L040 Acute lymphadenitis of face, head and neck: Secondary | ICD-10-CM | POA: Diagnosis not present

## 2017-07-25 DIAGNOSIS — Z79899 Other long term (current) drug therapy: Secondary | ICD-10-CM

## 2017-07-25 DIAGNOSIS — Z923 Personal history of irradiation: Secondary | ICD-10-CM | POA: Diagnosis not present

## 2017-07-25 DIAGNOSIS — R590 Localized enlarged lymph nodes: Secondary | ICD-10-CM | POA: Diagnosis not present

## 2017-07-25 DIAGNOSIS — I428 Other cardiomyopathies: Secondary | ICD-10-CM | POA: Diagnosis present

## 2017-07-25 DIAGNOSIS — I881 Chronic lymphadenitis, except mesenteric: Secondary | ICD-10-CM | POA: Diagnosis not present

## 2017-07-25 DIAGNOSIS — E1140 Type 2 diabetes mellitus with diabetic neuropathy, unspecified: Secondary | ICD-10-CM | POA: Diagnosis not present

## 2017-07-25 DIAGNOSIS — I480 Paroxysmal atrial fibrillation: Secondary | ICD-10-CM | POA: Diagnosis not present

## 2017-07-25 HISTORY — PX: RADICAL NECK DISSECTION: SHX2284

## 2017-07-25 LAB — PROTIME-INR
INR: 1.27
Prothrombin Time: 15.8 seconds — ABNORMAL HIGH (ref 11.4–15.2)

## 2017-07-25 LAB — GLUCOSE, CAPILLARY
Glucose-Capillary: 106 mg/dL — ABNORMAL HIGH (ref 65–99)
Glucose-Capillary: 93 mg/dL (ref 65–99)

## 2017-07-25 SURGERY — DISSECTION, NECK, RADICAL
Anesthesia: General | Site: Neck | Laterality: Right

## 2017-07-25 MED ORDER — LIDOCAINE-EPINEPHRINE 1 %-1:100000 IJ SOLN
INTRAMUSCULAR | Status: AC
  Filled 2017-07-25: qty 1

## 2017-07-25 MED ORDER — DEXAMETHASONE SODIUM PHOSPHATE 10 MG/ML IJ SOLN
INTRAMUSCULAR | Status: AC
  Filled 2017-07-25: qty 1

## 2017-07-25 MED ORDER — HYDROMORPHONE HCL 1 MG/ML IJ SOLN: 0.2500 mg | INTRAMUSCULAR | Status: DC | PRN

## 2017-07-25 MED ORDER — DILTIAZEM HCL 60 MG PO TABS
90.0000 mg | ORAL_TABLET | Freq: Every day | ORAL | Status: DC
  Administered 2017-07-26: 90 mg via ORAL
  Filled 2017-07-25: qty 2

## 2017-07-25 MED ORDER — SODIUM FLUORIDE 1.1 % DT CREA: 1.0000 "application " | TOPICAL_CREAM | Freq: Every day | DENTAL | Status: DC

## 2017-07-25 MED ORDER — LACTATED RINGERS IV SOLN: INTRAVENOUS | Status: DC

## 2017-07-25 MED ORDER — DIGOXIN 125 MCG PO TABS
0.1250 mg | ORAL_TABLET | Freq: Every day | ORAL | Status: DC
  Administered 2017-07-26: 0.125 mg via ORAL
  Filled 2017-07-25: qty 1

## 2017-07-25 MED ORDER — PHENYLEPHRINE HCL 10 MG/ML IJ SOLN
INTRAVENOUS | Status: DC | PRN
  Administered 2017-07-25: 25 ug/min via INTRAVENOUS

## 2017-07-25 MED ORDER — MEPERIDINE HCL 25 MG/ML IJ SOLN: 6.2500 mg | INTRAMUSCULAR | Status: DC | PRN

## 2017-07-25 MED ORDER — CHLORHEXIDINE GLUCONATE CLOTH 2 % EX PADS: 6.0000 | MEDICATED_PAD | Freq: Once | CUTANEOUS | Status: DC

## 2017-07-25 MED ORDER — LIDOCAINE HCL (CARDIAC) 20 MG/ML IV SOLN
INTRAVENOUS | Status: DC | PRN
  Administered 2017-07-25: 60 mg via INTRATRACHEAL

## 2017-07-25 MED ORDER — ACETAMINOPHEN 650 MG RE SUPP: 650.0000 mg | RECTAL | Status: DC | PRN

## 2017-07-25 MED ORDER — ONDANSETRON HCL 4 MG/2ML IJ SOLN
INTRAMUSCULAR | Status: AC
  Filled 2017-07-25: qty 2

## 2017-07-25 MED ORDER — ACETAMINOPHEN 160 MG/5ML PO SOLN: 650.0000 mg | ORAL | Status: DC | PRN

## 2017-07-25 MED ORDER — MIDAZOLAM HCL 2 MG/2ML IJ SOLN
INTRAMUSCULAR | Status: AC
Start: 2017-07-25 — End: 2017-07-25
  Filled 2017-07-25: qty 2

## 2017-07-25 MED ORDER — OSMOLITE 1.5 CAL PO LIQD
355.0000 mL | ORAL | Status: DC
  Administered 2017-07-25: 400 mL
  Filled 2017-07-25: qty 711

## 2017-07-25 MED ORDER — TORSEMIDE 20 MG PO TABS
20.0000 mg | ORAL_TABLET | Freq: Every day | ORAL | Status: DC
Start: 2017-07-25 — End: 2017-07-26
  Administered 2017-07-25 – 2017-07-26 (×2): 20 mg via ORAL
  Filled 2017-07-25 (×2): qty 1

## 2017-07-25 MED ORDER — BACITRACIN ZINC 500 UNIT/GM EX OINT
1.0000 "application " | TOPICAL_OINTMENT | Freq: Three times a day (TID) | CUTANEOUS | Status: DC
  Administered 2017-07-25 – 2017-07-26 (×4): 1 via TOPICAL
  Filled 2017-07-25: qty 28.35

## 2017-07-25 MED ORDER — EPHEDRINE 5 MG/ML INJ
INTRAVENOUS | Status: AC
  Filled 2017-07-25: qty 10

## 2017-07-25 MED ORDER — PROPOFOL 10 MG/ML IV BOLUS
INTRAVENOUS | Status: DC | PRN
  Administered 2017-07-25: 90 mg via INTRAVENOUS

## 2017-07-25 MED ORDER — BACITRACIN ZINC 500 UNIT/GM EX OINT
TOPICAL_OINTMENT | CUTANEOUS | Status: AC
  Filled 2017-07-25: qty 28.35

## 2017-07-25 MED ORDER — MIDAZOLAM HCL 5 MG/5ML IJ SOLN
INTRAMUSCULAR | Status: DC | PRN
  Administered 2017-07-25: 1 mg via INTRAVENOUS

## 2017-07-25 MED ORDER — SUCCINYLCHOLINE CHLORIDE 20 MG/ML IJ SOLN
INTRAMUSCULAR | Status: DC | PRN
  Administered 2017-07-25: 100 mg via INTRAVENOUS

## 2017-07-25 MED ORDER — PROMETHAZINE HCL 25 MG/ML IJ SOLN: 6.2500 mg | INTRAMUSCULAR | Status: DC | PRN

## 2017-07-25 MED ORDER — ALBUMIN HUMAN 5 % IV SOLN
INTRAVENOUS | Status: DC | PRN
  Administered 2017-07-25: 11:00:00 via INTRAVENOUS

## 2017-07-25 MED ORDER — LACTATED RINGERS IV SOLN
INTRAVENOUS | Status: DC
  Administered 2017-07-25 (×2): via INTRAVENOUS

## 2017-07-25 MED ORDER — ONDANSETRON HCL 4 MG/2ML IJ SOLN: 4.0000 mg | INTRAMUSCULAR | Status: DC | PRN

## 2017-07-25 MED ORDER — EPHEDRINE SULFATE 50 MG/ML IJ SOLN
INTRAMUSCULAR | Status: DC | PRN
  Administered 2017-07-25: 10 mg via INTRAVENOUS
  Administered 2017-07-25: 5 mg via INTRAVENOUS

## 2017-07-25 MED ORDER — LIDOCAINE-EPINEPHRINE 1 %-1:100000 IJ SOLN
INTRAMUSCULAR | Status: DC | PRN
  Administered 2017-07-25: 8 mL

## 2017-07-25 MED ORDER — OSMOLITE 1.5 CAL PO LIQD
474.0000 mL | Freq: Three times a day (TID) | ORAL | Status: DC
  Administered 2017-07-26: 474 mL
  Filled 2017-07-25 (×3): qty 474

## 2017-07-25 MED ORDER — CARVEDILOL 25 MG PO TABS
25.0000 mg | ORAL_TABLET | Freq: Two times a day (BID) | ORAL | Status: DC
  Administered 2017-07-25 – 2017-07-26 (×3): 25 mg via ORAL
  Filled 2017-07-25 (×3): qty 1

## 2017-07-25 MED ORDER — HEMOSTATIC AGENTS (NO CHARGE) OPTIME
TOPICAL | Status: DC | PRN
  Administered 2017-07-25: 1 via TOPICAL

## 2017-07-25 MED ORDER — MORPHINE SULFATE (PF) 4 MG/ML IV SOLN: 2.0000 mg | INTRAVENOUS | Status: DC | PRN

## 2017-07-25 MED ORDER — SUGAMMADEX SODIUM 500 MG/5ML IV SOLN
INTRAVENOUS | Status: DC | PRN
  Administered 2017-07-25: 300 mg via INTRAVENOUS

## 2017-07-25 MED ORDER — ROCURONIUM BROMIDE 100 MG/10ML IV SOLN
INTRAVENOUS | Status: DC | PRN
  Administered 2017-07-25: 50 mg via INTRAVENOUS
  Administered 2017-07-25: 20 mg via INTRAVENOUS

## 2017-07-25 MED ORDER — CEFAZOLIN SODIUM-DEXTROSE 1-4 GM/50ML-% IV SOLN
1.0000 g | Freq: Three times a day (TID) | INTRAVENOUS | Status: DC
  Administered 2017-07-25 – 2017-07-26 (×4): 1 g via INTRAVENOUS
  Filled 2017-07-25 (×6): qty 50

## 2017-07-25 MED ORDER — KCL IN DEXTROSE-NACL 20-5-0.45 MEQ/L-%-% IV SOLN
INTRAVENOUS | Status: DC
  Administered 2017-07-25 – 2017-07-26 (×2): via INTRAVENOUS
  Filled 2017-07-25 (×3): qty 1000

## 2017-07-25 MED ORDER — PHENYLEPHRINE 40 MCG/ML (10ML) SYRINGE FOR IV PUSH (FOR BLOOD PRESSURE SUPPORT)
PREFILLED_SYRINGE | INTRAVENOUS | Status: AC
  Filled 2017-07-25: qty 10

## 2017-07-25 MED ORDER — ONDANSETRON HCL 4 MG PO TABS: 4.0000 mg | ORAL_TABLET | ORAL | Status: DC | PRN

## 2017-07-25 MED ORDER — FENTANYL CITRATE (PF) 250 MCG/5ML IJ SOLN
INTRAMUSCULAR | Status: AC
  Filled 2017-07-25: qty 5

## 2017-07-25 MED ORDER — CEFAZOLIN SODIUM-DEXTROSE 2-4 GM/100ML-% IV SOLN
2.0000 g | INTRAVENOUS | Status: AC
  Administered 2017-07-25: 2 g via INTRAVENOUS
  Filled 2017-07-25: qty 100

## 2017-07-25 MED ORDER — SUGAMMADEX SODIUM 500 MG/5ML IV SOLN
INTRAVENOUS | Status: AC
  Filled 2017-07-25: qty 5

## 2017-07-25 MED ORDER — DEXAMETHASONE SODIUM PHOSPHATE 10 MG/ML IJ SOLN
INTRAMUSCULAR | Status: DC | PRN
  Administered 2017-07-25: 5 mg via INTRAVENOUS

## 2017-07-25 MED ORDER — ONDANSETRON HCL 4 MG/2ML IJ SOLN
INTRAMUSCULAR | Status: DC | PRN
  Administered 2017-07-25: 4 mg via INTRAVENOUS

## 2017-07-25 MED ORDER — HYDROCODONE-ACETAMINOPHEN 5-325 MG PO TABS: 1.0000 | ORAL_TABLET | ORAL | Status: DC | PRN

## 2017-07-25 MED ORDER — SUCCINYLCHOLINE CHLORIDE 200 MG/10ML IV SOSY
PREFILLED_SYRINGE | INTRAVENOUS | Status: AC
  Filled 2017-07-25: qty 10

## 2017-07-25 MED ORDER — POLYETHYLENE GLYCOL 3350 17 G PO PACK: 17.0000 g | PACK | Freq: Every day | ORAL | Status: DC | PRN

## 2017-07-25 MED ORDER — FENTANYL CITRATE (PF) 250 MCG/5ML IJ SOLN
INTRAMUSCULAR | Status: DC | PRN
  Administered 2017-07-25: 200 ug via INTRAVENOUS
  Administered 2017-07-25 (×2): 25 ug via INTRAVENOUS

## 2017-07-25 MED ORDER — LIDOCAINE 2% (20 MG/ML) 5 ML SYRINGE
INTRAMUSCULAR | Status: AC
  Filled 2017-07-25: qty 10

## 2017-07-25 SURGICAL SUPPLY — 49 items
BIOPATCH RED 1 DISK 7.0 (GAUZE/BANDAGES/DRESSINGS) ×2 IMPLANT
CANISTER SUCT 3000ML PPV (MISCELLANEOUS) ×2 IMPLANT
CLEANER TIP ELECTROSURG 2X2 (MISCELLANEOUS) ×2 IMPLANT
CONT SPEC 4OZ CLIKSEAL STRL BL (MISCELLANEOUS) ×2 IMPLANT
COVER SURGICAL LIGHT HANDLE (MISCELLANEOUS) ×2 IMPLANT
DECANTER SPIKE VIAL GLASS SM (MISCELLANEOUS) ×2 IMPLANT
DRAIN PENROSE 1/2X12 LTX STRL (WOUND CARE) ×2 IMPLANT
DRSG TEGADERM 2-3/8X2-3/4 SM (GAUZE/BANDAGES/DRESSINGS) ×2 IMPLANT
ELECT COATED BLADE 2.86 ST (ELECTRODE) ×2 IMPLANT
ELECT REM PT RETURN 9FT ADLT (ELECTROSURGICAL) ×2
ELECTRODE REM PT RTRN 9FT ADLT (ELECTROSURGICAL) ×1 IMPLANT
EVACUATOR 3/16  PVC DRAIN (DRAIN) ×1
EVACUATOR 3/16 PVC DRAIN (DRAIN) ×1 IMPLANT
FORCEPS BIPOLAR SPETZLER 8 1.0 (NEUROSURGERY SUPPLIES) ×2 IMPLANT
GAUZE SPONGE 4X4 16PLY XRAY LF (GAUZE/BANDAGES/DRESSINGS) ×4 IMPLANT
GLOVE SS BIOGEL STRL SZ 7.5 (GLOVE) ×1 IMPLANT
GLOVE SUPERSENSE BIOGEL SZ 7.5 (GLOVE) ×1
GLOVE SURG SS PI 6.5 STRL IVOR (GLOVE) ×4 IMPLANT
GLOVE SURG SS PI 7.5 STRL IVOR (GLOVE) ×2 IMPLANT
GOWN STRL REUS W/ TWL LRG LVL3 (GOWN DISPOSABLE) ×1 IMPLANT
GOWN STRL REUS W/ TWL XL LVL3 (GOWN DISPOSABLE) ×2 IMPLANT
GOWN STRL REUS W/TWL LRG LVL3 (GOWN DISPOSABLE) ×1
GOWN STRL REUS W/TWL XL LVL3 (GOWN DISPOSABLE) ×2
HEMOSTAT SNOW SURGICEL 2X4 (HEMOSTASIS) ×2 IMPLANT
KIT BASIN OR (CUSTOM PROCEDURE TRAY) ×2 IMPLANT
KIT ROOM TURNOVER OR (KITS) ×2 IMPLANT
LATEX FREE 14 FR STRAIGHT CATH KIT ×2 IMPLANT
MARKER SKIN DUAL TIP RULER LAB (MISCELLANEOUS) ×2 IMPLANT
NEEDLE HYPO 25GX1X1/2 BEV (NEEDLE) IMPLANT
NS IRRIG 1000ML POUR BTL (IV SOLUTION) ×2 IMPLANT
PAD ARMBOARD 7.5X6 YLW CONV (MISCELLANEOUS) ×4 IMPLANT
PENCIL BUTTON HOLSTER BLD 10FT (ELECTRODE) ×2 IMPLANT
SPECIMEN JAR MEDIUM (MISCELLANEOUS) ×2 IMPLANT
SPONGE INTESTINAL PEANUT (DISPOSABLE) ×2 IMPLANT
SPONGE LAP 18X18 X RAY DECT (DISPOSABLE) ×2 IMPLANT
STAPLER VISISTAT 35W (STAPLE) ×2 IMPLANT
SUT CHROMIC 2 0 SH (SUTURE) ×4 IMPLANT
SUT SILK 2 0 (SUTURE) ×1
SUT SILK 2 0 SH CR/8 (SUTURE) ×2 IMPLANT
SUT SILK 2-0 18XBRD TIE 12 (SUTURE) ×1 IMPLANT
SUT SILK 3 0 (SUTURE) ×1
SUT SILK 3-0 18XBRD TIE 12 (SUTURE) ×1 IMPLANT
SUT SILK 4 0 (SUTURE) ×1
SUT SILK 4-0 18XBRD TIE 12 (SUTURE) ×1 IMPLANT
SUT VIC AB 4-0 RB1 27 (SUTURE) ×1
SUT VIC AB 4-0 RB1 27X BRD (SUTURE) ×1 IMPLANT
TOWEL OR 17X24 6PK STRL BLUE (TOWEL DISPOSABLE) ×2 IMPLANT
TRAY ENT MC OR (CUSTOM PROCEDURE TRAY) ×2 IMPLANT
TRAY FOLEY CATH SILVER 16FR (SET/KITS/TRAYS/PACK) ×2 IMPLANT

## 2017-07-25 NOTE — Progress Notes (Signed)
Per Ander Purpura, RN, [employer] came to bedside to turn AICD back on.

## 2017-07-25 NOTE — Transfer of Care (Signed)
Immediate Anesthesia Transfer of Care Note  Patient: Collin [name]  Procedure(s) Performed: RIGHT NECK DISSECTION (Right Neck)  Patient Location: PACU  Anesthesia Type:General  Level of Consciousness: awake, alert  and oriented  Airway & Oxygen Therapy: Patient Spontanous Breathing and Patient connected to nasal cannula oxygen  Post-op Assessment: Report given to RN, Post -op Vital signs reviewed and stable and Patient moving all extremities X 4  Post vital signs: Reviewed and stable  Last Vitals:  Vitals:   07/25/17 0720 07/25/17 1141  BP: 122/75 (!) 97/56  Pulse: 77 84  Resp: 19 17  Temp: 36.6 C 36.4 C  SpO2: 96% 99%    Last Pain:  Vitals:   07/25/17 1141  TempSrc:   PainSc: 0-No pain      Patients Stated Pain Goal: 3 (74/12/87 8676)  Complications: No apparent anesthesia complications

## 2017-07-25 NOTE — Interval H&P Note (Signed)
History and Physical Interval Note:  07/25/2017 8:55 AM  Collin [name]  has presented today for surgery, with the diagnosis of right neck cancer  The various methods of treatment have been discussed with the patient and family. After consideration of risks, benefits and other options for treatment, the patient has consented to  Procedure(s): RIGHT NECK DISSECTION (Right) as a surgical intervention .  The patient's history has been reviewed, patient examined, no change in status, stable for surgery.  I have reviewed the patient's chart and labs.  Questions were answered to the patient's satisfaction.     Melony Overly

## 2017-07-25 NOTE — OR Nursing (Signed)
Attempted foley under supervision of Dr. Lucia Gaskins.  Unable to pass catheter to the bladder without substantial resistance.  Dr. Lucia Gaskins stated ok to leave out.

## 2017-07-25 NOTE — Progress Notes (Signed)
PO Check AF VSS Doing well without complaints. Neck flat without swelling. Hemovac with minimal output. Stable post op course. Continue ancef.

## 2017-07-25 NOTE — Anesthesia Postprocedure Evaluation (Signed)
Anesthesia Post Note  Patient: Collin [name]  Procedure(s) Performed: RIGHT NECK DISSECTION (Right Neck)     Patient location during evaluation: PACU Anesthesia Type: General Level of consciousness: awake and alert Pain management: pain level controlled Vital Signs Assessment: post-procedure vital signs reviewed and stable Respiratory status: spontaneous breathing, nonlabored ventilation, respiratory function stable and patient connected to nasal cannula oxygen Cardiovascular status: blood pressure returned to baseline and stable Postop Assessment: no apparent nausea or vomiting Anesthetic complications: no    Last Vitals:  Vitals:   07/25/17 1240 07/25/17 1316  BP: 97/69 103/66  Pulse: 77 77  Resp: 18 18  Temp: 36.6 C 36.4 C  SpO2: 98% 95%    Last Pain:  Vitals:   07/25/17 1316  TempSrc: Oral  PainSc:                  Effie Berkshire

## 2017-07-25 NOTE — Brief Op Note (Signed)
07/25/2017  11:20 AM  PATIENT:  Collin [name]  76 y.o. male  PRE-OPERATIVE DIAGNOSIS:  right neck cancer  POST-OPERATIVE DIAGNOSIS:  right neck cancer  PROCEDURE:  Procedure(s): RIGHT NECK DISSECTION (Right)  Modified  SURGEON:  Surgeon(s) and Role:    Rozetta Nunnery, MD - Primary    * Thornell Sartorius, MD - Assisting  PHYSICIAN ASSISTANT:   ASSISTANTS: Minna Merritts   ANESTHESIA:   general  EBL:  10 mL   BLOOD ADMINISTERED:none  DRAINS: none   LOCAL MEDICATIONS USED:  XYLOCAINE with EPI 8 cc  SPECIMEN:  Source of Specimen:  right neck nodes  DISPOSITION OF SPECIMEN:  PATHOLOGY  COUNTS:  YES  TOURNIQUET:  * No tourniquets in log *  DICTATION: .Other Dictation: Dictation Number [id number]  PLAN OF CARE: Admit to inpatient   PATIENT DISPOSITION:  PACU - hemodynamically stable.   Delay start of Pharmacological VTE agent (>24hrs) due to surgical blood loss or risk of bleeding: yes

## 2017-07-25 NOTE — OR Nursing (Signed)
Per Dr. Smith Lenox order bladder scan performed at the end of case.  741m detected upon scan by SSharyn Dross RNFA. Attempted foley x1 with a 137flatex free catheter at 1125.  Resistance still met and unable to pass. Dr. HoSmith Robertotified and gave verbal order to wake patient up and transport to PACU where they would continue to monitor and treat as needed.

## 2017-07-25 NOTE — Progress Notes (Signed)
Patient arrived to 6N23 A&Ox4, VSS, IV intact and infusing.  Noted to have incision with staple closure on right side of neck.  Denies any pain presently.    Patient oriented to room and equipment.  Will continue to monitor.

## 2017-07-25 NOTE — Anesthesia Procedure Notes (Signed)
Procedure Name: Intubation Date/Time: 07/25/2017 9:19 AM Performed by: Mariea Clonts, CRNA Pre-anesthesia Checklist: Patient identified, Emergency Drugs available, Suction available and Patient being monitored Patient Re-evaluated:Patient Re-evaluated prior to induction Oxygen Delivery Method: Circle System Utilized Preoxygenation: Pre-oxygenation with 100% oxygen Induction Type: IV induction Ventilation: Mask ventilation with difficulty, Two handed mask ventilation required and Oral airway inserted - appropriate to patient size Laryngoscope Size: Sabra Heck and 3 Grade View: Grade II Tube type: Oral Tube size: 7.5 mm Number of attempts: 1 Airway Equipment and Method: Stylet and Oral airway Placement Confirmation: ETT inserted through vocal cords under direct vision,  positive ETCO2 and breath sounds checked- equal and bilateral Tube secured with: Tape Dental Injury: Teeth and Oropharynx as per pre-operative assessment

## 2017-07-25 NOTE — Anesthesia Preprocedure Evaluation (Addendum)
Anesthesia Evaluation  Patient identified by MRN, date of birth, ID band Patient awake    Reviewed: Allergy & Precautions, NPO status , Patient's Chart, lab work & pertinent test results, reviewed documented beta blocker date and time   Airway Mallampati: I  TM Distance: >3 FB Neck ROM: Full    Dental  (+) Teeth Intact, Dental Advisory Given   Pulmonary sleep apnea ,    breath sounds clear to auscultation       Cardiovascular +CHF  negative cardio ROS  + dysrhythmias Atrial Fibrillation + Cardiac Defibrillator  Rhythm:Regular Rate:Normal     Neuro/Psych Anxiety  Neuromuscular disease    GI/Hepatic negative GI ROS, Neg liver ROS,   Endo/Other  diabetes  Renal/GU Renal disease     Musculoskeletal negative musculoskeletal ROS (+)   Abdominal   Peds  Hematology negative hematology ROS (+)   Anesthesia Other Findings Day of surgery medications reviewed with the patient.  Reproductive/Obstetrics                            Echo: - Left ventricle: Systolic function was normal. The estimated   ejection fraction was in the range of 55% to 60%. Wall motion was   normal; there were no regional wall motion abnormalities. - Left atrium: Severely dilated. - Right ventricle: The cavity size was normal. Wall thickness was   normal. AICD wire noted in right ventricle. Systolic function was   normal. - Right atrium: The atrium was mildly dilated. AICD wire noted in   right atrium. - Atrial septum: No defect or patent foramen ovale was identified. - Inferior vena cava: The vessel was normal in size. The   respirophasic diameter changes were in the normal range (>= 50%),   consistent with normal central venous pressure.  Anesthesia Physical Anesthesia Plan  ASA: III  Anesthesia Plan: General   Post-op Pain Management:    Induction: Intravenous  PONV Risk Score and Plan: 3 and Ondansetron,  Dexamethasone and Midazolam  Airway Management Planned: Oral ETT  Additional Equipment:   Intra-op Plan:   Post-operative Plan: Extubation in OR  Informed Consent: I have reviewed the patients History and Physical, chart, labs and discussed the procedure including the risks, benefits and alternatives for the proposed anesthesia with the patient or authorized representative who has indicated his/her understanding and acceptance.   Dental advisory given  Plan Discussed with: CRNA  Anesthesia Plan Comments: (Defibrillator off. Cart and pads in room. )       Anesthesia Quick Evaluation

## 2017-07-26 ENCOUNTER — Other Ambulatory Visit: Payer: Self-pay

## 2017-07-26 ENCOUNTER — Encounter (HOSPITAL_COMMUNITY): Payer: Self-pay | Admitting: Otolaryngology

## 2017-07-26 MED ORDER — HYDROCODONE-ACETAMINOPHEN 7.5-325 MG/15ML PO SOLN: 10.0000 mL | Freq: Four times a day (QID) | ORAL | 0 refills | Status: DC | PRN

## 2017-07-26 NOTE — Progress Notes (Signed)
POD 1 AF VSS Patient doing well with no pain complaints but states the right ear is numb. Neck wound doing well Last Hemovac output 20cc Hemovac was removed. Patient discharged home with wife. Continue regular meds and restart coumadin tomorrow. Will follow up in 6 days  Dictated discharge  [phone]

## 2017-07-26 NOTE — Op Note (Signed)
NAME:  Collin [name], Collin [name]                  ACCOUNT NO.:  MEDICAL RECORD NO.:  [id number]  LOCATION:                                 FACILITY:  PHYSICIAN:  Kristine Garbe. 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  OPERATION PERFORMED:  Modified right neck dissection.  SURGEON:  Kristine Garbe. Ezzard Standing, MD.  ASSISTANT SURGEON:  Hermelinda Medicus, MD.  ANESTHESIA:  General endotracheal.  COMPLICATIONS:  None.  ESTIMATED BLOOD LOSS:  80 mL.  COMPLICATIONS:  None.  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  He was treated primarily with radiation and chemotherapy and completed this in April.  He had large necrotic neck nodes on the right side and upon completion of his radiation and chemotherapy, he had a followup PET scan that was little bit indeterminate as to whether he had persistent cancer in the right neck, but had persistent enlarged nodes have been bothersome to the patient. He is taken to the operating room at this time for a modified right neck dissection.  DESCRIPTION OF PROCEDURE:  The patient was brought to the operating room and placed in the supine position with head turned to the left.  Right neck was prepped with Betadine solution, draped out with sterile towels. The proposed incision site was previously marked out and injected with 8 mL of Xylocaine with epinephrine for local hemostasis.  A standard right neck incision was made from the mastoid tip down low in the right neck using a supraclavicular crease.  On palpation, the patient had  a very large mid jugular node just in the submandibular area.  Had a separate node that was little bit more posteriorly, deep to the sternocleidomastoid muscle and on the more of the posterior aspect of the neck.  Subplatysmal flaps were elevated superiorly.  First, the posterior neck node was dissected out, the sternocleidomastoid muscle was preserved, and dissection was carried around on either side of the sternocleidomastoid muscle.  The node that was inferior-posterior was just deep to the sternocleidomastoid muscle.  It was dissected off the posterior attachments to the fat.  There was a fair amount of scar tissue.  There was also a very firm high node, which was labeled as superior jugular node.  This was carefully dissected off the jugular vein and actually sent as a frozen specimen.  On frozen specimen, this demonstrated just a fibrotic node with no evidence of active cancer. With the jugular vein under direct visualization, the dissection was carried down, and the larger nodes that had been persistent were dissected off the jugular vein.  The larger inferior node was labeled as a posterior neck node and the large anterior node was labeled as a superior-anterior node.  When dissecting off the jugular vein, there was one small tear inferiorly along  the jugular vein that was closed with a 4-0 Vicryl suture.  This adequately controlled the small leak in the jugular vein inferiorly.  After removing the obvious enlarged neck nodes from the neck, hemostasis was obtained with bipolar cautery.  Care was taken to preserve the auricular nerve as well as the accessory nerve and the marginal nerve.  Likewise, the facial vein was preserved.  Wound was irrigated with saline.  Jamelle Haring was placed over the closure of the jugular vein inferiorly.  A large drain was brought through a separate stab incision in the suprapubic area and hooked to a Hemovac.  The wound was closed with 2-0 chromic sutures  subcutaneously and staples on the skin. This completed the procedure.  The patient was awoken from anesthesia and transferred to the recovery room postoperatively doing well.  The patient will subsequently be admitted to the hospital for 1 or 2 nights.          ______________________________ Kristine Garbe. Ezzard Standing, M.D.     CEN/MEDQ  D:  07/25/2017  T:  07/25/2017  Job:  [id number]

## 2017-07-26 NOTE — Progress Notes (Signed)
Rechecked with Pt regarding bath. Pt coming out of bathroom with wife and when asked about bath, Pt states he is passing on bath. Pt states he has put on a clean gown but will pass on bath.

## 2017-07-26 NOTE — Progress Notes (Signed)
POD 1 AF VSS Doing well without complaints Neck wound flat with minimal output from Hemovac Stable post op course Possible discharge this evening or in am

## 2017-07-26 NOTE — Progress Notes (Signed)
Stevie Kern  discharged per MD order. Discussed with the patient and all questions fully answered.  VSS, Skin clean, dry and intact without evidence of skin break down, no evidence of skin tears noted.  IV catheter discontinued intact. Site without signs and symptoms of complications. Dressing and pressure applied.  An After Visit Summary was printed and given to the patient. Patient received prescription.  Discharge education completed with patient/family including follow up instructions, medication list, d/c activities limitations if indicated, with other d/c instructions as indicated by MD - patient able to verbalize understanding, all questions fully answered.   Patient instructed to return to ED, call 911, or call MD for any changes in condition.   Patient to be escorted via [employer], and D/C home via private auto.

## 2017-07-26 NOTE — Discharge Instructions (Signed)
OK to take shower and get incision site wet tomorrow. Start coumadin tomorrow Apply antibiotic ointment to incision site daily Call office for follow up appt for next [facility] or Tues     [phone]

## 2017-07-27 NOTE — Discharge Summary (Signed)
NAME:  Collin [name], Collin [name]                  ACCOUNT NO.:  MEDICAL RECORD NO.:  [id number]  LOCATION:                                 FACILITY:  PHYSICIAN:  Leonides Sake. Lucia Gaskins, M.D.DATE OF BIRTH:  11-08-1940  DATE OF ADMISSION:  07/25/2017 DATE OF DISCHARGE:  07/26/2017                              DISCHARGE SUMMARY   DIAGNOSES: 1. 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 2. History of chronic atrial fibrillation, on Coumadin. 3. Nonischemic cardiomyopathy. 4. Type 2 diabetes.  OPERATIONS DURING THIS HOSPITALIZATION:  Right modified neck dissection on July 25, 2017.  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  A PET scan performed 2 months ago demonstrated questionable activity in the right neck nodes.  These nodes were very large cystic necrotic nodes. However, the patient has had a lot of discomfort associated with the right neck nodes.  On discussion with him concerning observation versus excision of the nodes, he preferred to have these nodes excised.  He was cleared by his cardiologist and this patient was taken operating room at this time for modified right neck dissection.  He stopped Coumadin 5 days prior to surgery.  The patient was admitted via the operating room on July 25, 2017, at which time he underwent a right modified neck dissection.  During the procedure, one node was sent for frozen section and this demonstrated just fibrotic node with no evidence of active cancer.  Postoperatively, the patient had a Hemovac drain placed and received postoperative Ancef.  He remained afebrile.  He was able to eat and drink without discomfort and used his gastrostomy  tube that had been previously placed.  He remained afebrile.  The patient's Hemovac drain was removed on the evening of his first postoperative day with minimal output of 20 mL over the previous 16 hours.  Neck wound was doing well. The patient was discharged home on July 26, 2017.  He will resume his Coumadin starting tomorrow in addition to his regular medications. Also prescribed hydrocodone suspension 10-15 mL q.6 hours p.r.n. pain. We will have him follow up in my office in 6 days for recheck, review final pathology, and have his staples removed from his neck.          ______________________________ Leonides Sake. Lucia Gaskins, M.D.     CEN/MEDQ  D:  07/26/2017  T:  07/26/2017  Job:  [id number]

## 2017-07-30 DIAGNOSIS — C140 Malignant neoplasm of pharynx, unspecified: Secondary | ICD-10-CM | POA: Diagnosis not present

## 2017-07-30 DIAGNOSIS — I428 Other cardiomyopathies: Secondary | ICD-10-CM | POA: Diagnosis not present

## 2017-08-03 ENCOUNTER — Ambulatory Visit: Payer: Medicare HMO | Admitting: General Practice

## 2017-08-03 DIAGNOSIS — I480 Paroxysmal atrial fibrillation: Secondary | ICD-10-CM | POA: Diagnosis not present

## 2017-08-03 DIAGNOSIS — Z7901 Long term (current) use of anticoagulants: Secondary | ICD-10-CM

## 2017-08-03 LAB — POCT INR: INR: 1.4

## 2017-08-03 NOTE — Patient Instructions (Addendum)
Pre visit review using our clinic review tool, if applicable. No additional management support is needed unless otherwise documented below in the visit note.  Take 10 mg today and tomorrow (11/28 and 11/29) and then resume taking 5 mg daily except 7.5 mg on Wednesday.  Re-check in 2 weeks.

## 2017-08-05 ENCOUNTER — Ambulatory Visit: Payer: Self-pay | Admitting: Internal Medicine

## 2017-08-08 DIAGNOSIS — H26493 Other secondary cataract, bilateral: Secondary | ICD-10-CM | POA: Diagnosis not present

## 2017-08-08 DIAGNOSIS — H26491 Other secondary cataract, right eye: Secondary | ICD-10-CM | POA: Diagnosis not present

## 2017-08-08 DIAGNOSIS — H26492 Other secondary cataract, left eye: Secondary | ICD-10-CM | POA: Diagnosis not present

## 2017-08-08 DIAGNOSIS — H40013 Open angle with borderline findings, low risk, bilateral: Secondary | ICD-10-CM | POA: Diagnosis not present

## 2017-08-17 ENCOUNTER — Ambulatory Visit: Payer: Medicare HMO | Admitting: General Practice

## 2017-08-17 DIAGNOSIS — I480 Paroxysmal atrial fibrillation: Secondary | ICD-10-CM | POA: Diagnosis not present

## 2017-08-17 DIAGNOSIS — Z7901 Long term (current) use of anticoagulants: Secondary | ICD-10-CM

## 2017-08-17 LAB — POCT INR: INR: 2.3

## 2017-08-17 NOTE — Progress Notes (Signed)
error 

## 2017-08-17 NOTE — Patient Instructions (Addendum)
Pre visit review using our clinic review tool, if applicable. No additional management support is needed unless otherwise documented below in the visit note.  Continue taking 5 mg daily except 7.5 mg on Wednesday.  Re-check in 4 weeks.

## 2017-08-18 ENCOUNTER — Telehealth: Payer: Self-pay | Admitting: *Deleted

## 2017-08-18 NOTE — Telephone Encounter (Signed)
Called patient to inform that he needs to have a scan before fu visit, lvm for a return call

## 2017-08-18 NOTE — Telephone Encounter (Signed)
CALLED PATIENT TO INFORM OF LAB AND CT FOR 09-01-17 @ [facility], PT. TO BE NPO- 4 HRS. PRIOR TO TEST AND FU ON 09-02-17 @ 3 PM WITH DR. Isidore Moos, SPOKE WITH PATIENT'S WIFE - THERESA AND SHE IS AWARE OF THESE APPTS.

## 2017-08-19 ENCOUNTER — Ambulatory Visit
Admission: RE | Admit: 2017-08-19 | Discharge: 2017-08-19 | Disposition: A | Discharge status: Home / Self Care | Payer: Medicare HMO | Source: Ambulatory Visit | Attending: Radiation Oncology | Admitting: Radiation Oncology

## 2017-08-19 DIAGNOSIS — Z7901 Long term (current) use of anticoagulants: Secondary | ICD-10-CM | POA: Insufficient documentation

## 2017-08-19 DIAGNOSIS — Z931 Gastrostomy status: Secondary | ICD-10-CM | POA: Insufficient documentation

## 2017-08-19 DIAGNOSIS — C01 Malignant neoplasm of base of tongue: Secondary | ICD-10-CM | POA: Insufficient documentation

## 2017-08-19 DIAGNOSIS — R600 Localized edema: Secondary | ICD-10-CM | POA: Insufficient documentation

## 2017-08-19 DIAGNOSIS — R05 Cough: Secondary | ICD-10-CM | POA: Insufficient documentation

## 2017-08-19 DIAGNOSIS — Z79899 Other long term (current) drug therapy: Secondary | ICD-10-CM | POA: Insufficient documentation

## 2017-08-19 DIAGNOSIS — Z7984 Long term (current) use of oral hypoglycemic drugs: Secondary | ICD-10-CM | POA: Insufficient documentation

## 2017-08-22 ENCOUNTER — Telehealth: Payer: Self-pay | Admitting: Internal Medicine

## 2017-08-22 DIAGNOSIS — C140 Malignant neoplasm of pharynx, unspecified: Secondary | ICD-10-CM | POA: Diagnosis not present

## 2017-08-22 DIAGNOSIS — I428 Other cardiomyopathies: Secondary | ICD-10-CM | POA: Diagnosis not present

## 2017-08-22 NOTE — Telephone Encounter (Signed)
Patient wife Collin [name], calling states that patient had radiation and he lost 80 pounds. Patient states that you can see his defibrillator through his skin, and due to the weight loss. Mrs. Shin is afraid that something is pulling on the tissue around his defib site.

## 2017-08-22 NOTE — Telephone Encounter (Signed)
Spoke with pt's wife regarding defib site, she stated that there was nothing protruding out of the skin, and there were no letters or numbers visible through the skin on the defib itself, pts wife voiced understanding to call if any of the above conditions develop. Informed her that the pinching sensation that he was feeling was likely some nerve pain. Pt's wife voiced understanding. Pts yearly apt made to see Dr. Lovena Le in Jan.

## 2017-08-24 ENCOUNTER — Ambulatory Visit: Payer: Medicare HMO | Admitting: Family Medicine

## 2017-08-24 ENCOUNTER — Encounter: Payer: Self-pay | Admitting: Family Medicine

## 2017-08-24 VITALS — BP 110/60 | HR 82 | Temp 97.5°F | Wt 223.3 lb

## 2017-08-24 DIAGNOSIS — R634 Abnormal weight loss: Secondary | ICD-10-CM

## 2017-08-24 DIAGNOSIS — I480 Paroxysmal atrial fibrillation: Secondary | ICD-10-CM

## 2017-08-24 DIAGNOSIS — C01 Malignant neoplasm of base of tongue: Secondary | ICD-10-CM | POA: Diagnosis not present

## 2017-08-24 DIAGNOSIS — E118 Type 2 diabetes mellitus with unspecified complications: Secondary | ICD-10-CM

## 2017-08-24 LAB — POCT GLYCOSYLATED HEMOGLOBIN (HGB A1C): Hemoglobin A1C: 5.7

## 2017-08-24 NOTE — Progress Notes (Signed)
Subjective:     Patient ID: Collin [name], male   DOB: [birth year], 76 y.o.   MRN: [id number]  HPI Patient seen for medical follow-up. Refer to previous note for details.  05-25-17 note: HPI "Patient has multiple chronic problems including history of obesity, nonischemic cardiomyopathy, atrial fibrillation, obstructive sleep apnea, history of type 2 diabetes, chronic systolic heart failure, hyperlipidemia. History of malignant neoplasm base of tongue and had radiation with restaging PET scan done on 05/17/17. He had multiple abnormalities on that scan and was directed to follow-up here. Fortunately PET scan did show response to therapy with decreased hypermetabolism within the tongue base and cervical node metastasis. There was no evidence for extra cervical metastatic disease.  Several other abnormalities were noted including  -gallbladder neck stone 2.1 cm -Prostatomegaly -Ventral wall hernia -Heterogenous hepatic activity and caudate lobe enlargement and gynecomastia-with recommendation to correlate for risk factors for cirrhosis. 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 He has lost some weight since his diagnosis and has been on liquid nutrition for months and just this week started eating some soft foods.   He denies any nocturia or difficulties with slow urinary stream or hesitancy. He has known hernia but has not had any recent localizing pains.  He does not have any known history of cirrhosis. He is on chronic Coumadin and has elevated INR from that but recent albumin 3.8 and normal platelets. No history of alcohol abuse.Marland Kitchen No prior mention of fatty liver changes."  History of type 2 diabetes. Was taken off metformin with recent loss of weight. 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 Has done well since that surgery. He has lost some additional weight. His wife states he is inconsistent with taking nutritional supplements. His appetite remains poor. He has finished up radiation therapy.  Wife states he had some mild cognitive impairment. They use a pillbox but he is sometimes not taking his medications as prescribed. He has history of type 2 diabetes which basically has been stable off medications. Repeat A1c today 5.7%.  Has history of peripheral edema and chronic systolic heart failure. Has been stable on torsemide 20 mg daily.  Past Medical History:  Diagnosis Date  . AICD (automatic cardioverter/defibrillator) present   . Atrial fibrillation (HCC)   . Cancer (HCC)    tongue cancer  . Cardiomyopathy, nonischemic (HCC) 1998  . CHF (congestive heart failure) (HCC)   . Claustrophobia   . Diabetes mellitus   . Dysrhythmia   . Heart disease   . History of echocardiogram    Echo 8/18:  EF 55-60, no RWMA, severe LAE, normal RVSF, mild RAE  . History of radiation therapy 12/07/16- 01/24/17   Base of Tongue 70 Gy in 35 fractions  . Hyperlipidemia   . OSA (obstructive sleep apnea)   . Shingles   . Umbilical hernia   . 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 It sometimes shows as a shadow on scans.    Past Surgical History:  Procedure Laterality Date  . CARDIAC DEFIBRILLATOR PLACEMENT  2006  . CATARACT EXTRACTION Bilateral    01/07/2016. 01/22/2016  . CYST EXCISION  multiple drainage to a cyst in his neck.   Marland Kitchen DIRECT LARYNGOSCOPY N/A 11/16/2016   Procedure: DIRECT LARYNGOSCOPY AND BIOPSY;  Surgeon: [name], MD;  Location: [facility] OR;  Service: ENT;  Laterality: N/A;  . EYE SURGERY    . HERNIA REPAIR     1950's  . IMPLANTABLE CARDIOVERTER DEFIBRILLATOR (ICD) GENERATOR CHANGE N/A 05/10/2014    Procedure: ICD GENERATOR CHANGE;  Surgeon: [name], MD;  Location: [facility] CATH LAB;  Service: Cardiovascular;  Laterality: N/A;  . IR GASTROSTOMY TUBE MOD SED  12/27/2016  . IR PATIENT EVAL TECH 0-60 MINS  01/17/2017  . MASS EXCISION Right 11/16/2016   Procedure: EXCISION RIGHT NECK LIPOMA;  Surgeon: [name], MD;  Location: [facility] OR;  Service: ENT;  Laterality: Right;  . MOHS SURGERY  01/01/2015   to top of head  . MOUTH SURGERY     teeth removed 4 teeth extracted 02/2010  . RADICAL NECK DISSECTION Right 07/25/2017   Procedure: RIGHT NECK DISSECTION;  Surgeon: [name], MD;  Location: [facility] OR;  Service: ENT;  Laterality: Right;    reports that  has never smoked. he has never used smokeless tobacco. He reports that he drinks alcohol. He reports that he does not use drugs. family history includes Heart disease in his mother; Hypertension in his father; Lupus in his mother; Stroke in his father; Sudden death in his mother. Allergies  Allergen Reactions  . Niaspan [Niacin Er] Other (See Comments)    REACTION NOT AN ALLERGY >  FLUSHED, EXPECTED W/NIACIN     Review of Systems  Constitutional: Positive for appetite change. Negative for chills, fatigue and fever.  Eyes: Negative for visual disturbance.  Respiratory: Negative for cough, chest tightness and shortness of breath.   Cardiovascular: Negative for chest pain, palpitations and leg swelling.  Neurological: Negative for dizziness, syncope, weakness, light-headedness and headaches.       Objective:   Physical Exam  Constitutional: He is oriented to person, place, and time. He appears well-developed and well-nourished.  Neck:  Scar right neck from previous surgery. Healing well  Cardiovascular: Normal rate and regular rhythm.  Pulmonary/Chest: Effort normal and breath sounds normal. No respiratory distress. He has no wheezes. He has no rales.  Musculoskeletal:  Trace edema ankles and legs bilaterally   Neurological: He is alert and oriented to person, place, and time. No cranial nerve deficit.       Assessment:     #1 history of cancer base of tongue with metastases to the right neck  #2 type 2 diabetes history improved following weight loss with A1c today 5.7%  #3 history of chronic atrial fibrillation on Coumadin  #4 history of CAD    Plan:     -We'll plan routine follow-up in 6 months -We strongly advise he take nutrition supplement as recommended -Continue close follow-up with oncology as well as cardiology  [name] MD  Primary Care at [facility]

## 2017-08-25 NOTE — Progress Notes (Signed)
Collin [name] presents for follow up of radiation completed 01/24/17 to his base of tongue.   Pain issues, if any: States that he is having some shoulder pain.States that he has some soreness in his throat. Using a feeding tube?: Yes States that he uses the tube three times per day 16 ozs per meal. Weight changes, if any: States that he has lost 78 pounds. Swallowing issues, if any: States that he has difficulty with swallowing,Saliva is thick. States that he coughs a lot while eating. Smoking or chewing tobacco? Denies Using fluoride trays daily? Denies Last ENT visit was on: Dr. Lucia Gaskins 08/01/17 post op visit. Other notable issues, if any:  Dr. Lucia Gaskins performed at modified right neck dissection on 07/25/17.  The pathology was negative.   CT Neck 08/22/17   Vitals:   09/02/17 1532 09/02/17 1534  BP: (!) 89/54 (!) 98/59  Pulse: 74 74  Resp: 20   Temp: 97.9 F (36.6 C)   TempSrc: Oral   SpO2: 98%   Weight: 222 lb 2 oz (100.8 kg)    Wt Readings from Last 3 Encounters:  09/02/17 222 lb 2 oz (100.8 kg)  08/24/17 223 lb 4.8 oz (101.3 kg)  07/25/17 223 lb 5.2 oz (101.3 kg)

## 2017-08-29 DIAGNOSIS — I428 Other cardiomyopathies: Secondary | ICD-10-CM | POA: Diagnosis not present

## 2017-08-29 DIAGNOSIS — C140 Malignant neoplasm of pharynx, unspecified: Secondary | ICD-10-CM | POA: Diagnosis not present

## 2017-08-31 DIAGNOSIS — B029 Zoster without complications: Secondary | ICD-10-CM | POA: Insufficient documentation

## 2017-08-31 DIAGNOSIS — I879 Disorder of vein, unspecified: Secondary | ICD-10-CM | POA: Insufficient documentation

## 2017-08-31 DIAGNOSIS — I499 Cardiac arrhythmia, unspecified: Secondary | ICD-10-CM | POA: Insufficient documentation

## 2017-08-31 DIAGNOSIS — Z9289 Personal history of other medical treatment: Secondary | ICD-10-CM | POA: Insufficient documentation

## 2017-08-31 DIAGNOSIS — Z923 Personal history of irradiation: Secondary | ICD-10-CM | POA: Insufficient documentation

## 2017-08-31 DIAGNOSIS — I509 Heart failure, unspecified: Secondary | ICD-10-CM | POA: Insufficient documentation

## 2017-08-31 DIAGNOSIS — K429 Umbilical hernia without obstruction or gangrene: Secondary | ICD-10-CM | POA: Insufficient documentation

## 2017-08-31 DIAGNOSIS — F40240 Claustrophobia: Secondary | ICD-10-CM | POA: Insufficient documentation

## 2017-08-31 DIAGNOSIS — I519 Heart disease, unspecified: Secondary | ICD-10-CM | POA: Insufficient documentation

## 2017-08-31 DIAGNOSIS — Z95810 Presence of automatic (implantable) cardiac defibrillator: Secondary | ICD-10-CM | POA: Insufficient documentation

## 2017-08-31 DIAGNOSIS — I4891 Unspecified atrial fibrillation: Secondary | ICD-10-CM | POA: Insufficient documentation

## 2017-08-31 DIAGNOSIS — C801 Malignant (primary) neoplasm, unspecified: Secondary | ICD-10-CM | POA: Insufficient documentation

## 2017-09-01 ENCOUNTER — Encounter (HOSPITAL_COMMUNITY): Payer: Self-pay

## 2017-09-01 ENCOUNTER — Ambulatory Visit
Admission: RE | Admit: 2017-09-01 | Discharge: 2017-09-01 | Disposition: A | Discharge status: Home / Self Care | Payer: Medicare HMO | Source: Ambulatory Visit | Attending: Radiation Oncology | Admitting: Radiation Oncology

## 2017-09-01 ENCOUNTER — Ambulatory Visit (HOSPITAL_COMMUNITY)
Admission: RE | Admit: 2017-09-01 | Discharge: 2017-09-01 | Disposition: A | Discharge status: Home / Self Care | Payer: Medicare HMO | Source: Ambulatory Visit | Attending: Radiation Oncology | Admitting: Radiation Oncology

## 2017-09-01 DIAGNOSIS — C01 Malignant neoplasm of base of tongue: Secondary | ICD-10-CM

## 2017-09-01 DIAGNOSIS — R591 Generalized enlarged lymph nodes: Secondary | ICD-10-CM | POA: Insufficient documentation

## 2017-09-01 DIAGNOSIS — R5383 Other fatigue: Secondary | ICD-10-CM

## 2017-09-01 DIAGNOSIS — C029 Malignant neoplasm of tongue, unspecified: Secondary | ICD-10-CM | POA: Diagnosis not present

## 2017-09-01 LAB — BUN AND CREATININE (CC13)
BUN: 22.3 mg/dL (ref 7.0–26.0)
Creatinine: 0.8 mg/dL (ref 0.7–1.3)
EGFR: >60 mL/min/{1.73_m2} (ref >60)

## 2017-09-01 LAB — TSH: TSH: 2.104 m(IU)/L (ref 0.320–4.118)

## 2017-09-01 MED ORDER — IOPAMIDOL (ISOVUE-300) INJECTION 61%
INTRAVENOUS | Status: AC
  Filled 2017-09-01: qty 75

## 2017-09-01 MED ORDER — IOPAMIDOL (ISOVUE-300) INJECTION 61%
75.0000 mL | Freq: Once | INTRAVENOUS | Status: AC | PRN
Start: 2017-09-01 — End: 2017-09-01
  Administered 2017-09-01: 75 mL via INTRAVENOUS

## 2017-09-02 ENCOUNTER — Ambulatory Visit
Admission: RE | Admit: 2017-09-02 | Discharge: 2017-09-02 | Disposition: A | Discharge status: Home / Self Care | Payer: Medicare HMO | Source: Ambulatory Visit | Attending: Radiation Oncology | Admitting: Radiation Oncology

## 2017-09-02 ENCOUNTER — Encounter: Payer: Self-pay | Admitting: Radiation Oncology

## 2017-09-02 ENCOUNTER — Other Ambulatory Visit: Payer: Self-pay

## 2017-09-02 VITALS — BP 98/59 | HR 74 | Temp 97.9°F | Resp 20 | Wt 222.1 lb

## 2017-09-02 DIAGNOSIS — Z85810 Personal history of malignant neoplasm of tongue: Secondary | ICD-10-CM | POA: Diagnosis not present

## 2017-09-02 DIAGNOSIS — Z08 Encounter for follow-up examination after completed treatment for malignant neoplasm: Secondary | ICD-10-CM | POA: Diagnosis not present

## 2017-09-02 DIAGNOSIS — R05 Cough: Secondary | ICD-10-CM | POA: Diagnosis not present

## 2017-09-02 DIAGNOSIS — Z931 Gastrostomy status: Secondary | ICD-10-CM | POA: Diagnosis not present

## 2017-09-02 DIAGNOSIS — C029 Malignant neoplasm of tongue, unspecified: Secondary | ICD-10-CM

## 2017-09-02 DIAGNOSIS — Z79899 Other long term (current) drug therapy: Secondary | ICD-10-CM | POA: Diagnosis not present

## 2017-09-02 DIAGNOSIS — C01 Malignant neoplasm of base of tongue: Secondary | ICD-10-CM | POA: Diagnosis not present

## 2017-09-02 DIAGNOSIS — Z7984 Long term (current) use of oral hypoglycemic drugs: Secondary | ICD-10-CM | POA: Diagnosis not present

## 2017-09-02 DIAGNOSIS — Z7901 Long term (current) use of anticoagulants: Secondary | ICD-10-CM | POA: Diagnosis not present

## 2017-09-02 DIAGNOSIS — R1310 Dysphagia, unspecified: Secondary | ICD-10-CM | POA: Diagnosis not present

## 2017-09-02 DIAGNOSIS — R600 Localized edema: Secondary | ICD-10-CM | POA: Diagnosis not present

## 2017-09-02 NOTE — Progress Notes (Signed)
Radiation Oncology         [phone] ________________________________  Name: Collin [name] MRN: [id number]  Date: 09/02/2017  DOB: 06-01-1941  Follow-Up Visit Note  CC: [name], MD  [name], *  Diagnosis and Prior Radiotherapy:       ICD-10-CM   1. Tongue cancer (HCC) C02.9   2. 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 Since his last follow-up, he underwent right neck dissection on 07/25/2017. His right jugular lymph nodes were negative for carcinoma but also revealed post-radiation fibrosis and necrosis, cystic degenerative changes, chronic inflammation and hemosiderin deposit.   He had a CT scan of his neck done yesterday that showed significant improvement in the base of tongue mass with no measurable tumor remaining and improvement in bilateral cervical adenopathy with no measurable adenopathy identified. There were also extensive post-radiation changes involving the pharynx and larynx and anterior soft tissues of the neck.     Pain issues, if any: He reports throat soreness and shoulder pain.   Using a feeding tube?: Yes, states that he uses the tube three times per day, 16 oz per meal.  Weight changes, if any: He reports a 78-pound weight loss. Wt Readings from Last 3 Encounters:  09/02/17 222 lb 2 oz (100.8 kg)  08/24/17 223 lb 4.8 oz (101.3 kg)  07/25/17 223 lb 5.2 oz (101.3 kg)   Swallowing issues, if any: He reports dysphagia due to thick saliva. He states that he coughs a lot while eating. He has not  followed up with his swallowing therapist - wife reports he was not compliant with exercises so they agreed to stop appts.  Smoking or chewing tobacco?: No  Using fluoride trays daily?: No  Last ENT visit was on: 08/01/2017 post-op visit with Dr. Ezzard Standing  Other notable issues, if any: He reports he has lost muscle strength and has not seen PT.              ALLERGIES:  is allergic to niaspan [niacin er].  Meds: Current Outpatient Medications  Medication Sig Dispense Refill  . acetaminophen (TYLENOL) 500 MG tablet Take 1,000 mg by mouth every 6 (six) hours as needed for moderate pain.    . carvedilol (COREG) 25 MG tablet TAKE 1 TABLET TWICE DAILY WITH MEALS (Patient taking differently: Take 25 mg by mouth twice daily) 180 tablet 1  . digoxin (LANOXIN) 0.125 MG tablet Take 0.125 mg daily by mouth.     . diltiazem (CARDIZEM) 90 MG tablet Take 1 tablet (90 mg total) by mouth daily. 90 tablet 3  . GuaiFENesin (TUSSIN PO) Take 10 mLs at bedtime as needed by mouth (congestion).    Marland Kitchen HYDROcodone-acetaminophen (HYCET) 7.5-325 mg/15 ml solution Take 10-15 mLs by mouth 4 (four) times daily as needed for moderate pain. 240 mL 0  . Nutritional Supplements (FEEDING SUPPLEMENT, OSMOLITE 1.5 CAL,) LIQD Give 2 cans of formula at 8am and 12 noon. Give additional 1 1/2 cans at 4pm and 8pm feeding.   Flush with 60ml of water  before and after feeding 4 times per day (Patient taking differently: Place 355-574 mLs See admin instructions into feeding tube. Give 2 cans of formula at 8am and 12 noon. Give additional 1 1/2 cans at 4pm and 8pm feeding.   Flush with 60ml of water before and after feeding 4 times per day) 1659 mL 0  . Nutritional Supplements (PROMOD) LIQD Give 30ml (1 oz) 2 times per day via tube. Flush with 60ml of water before and after given. (Patient taking differently: Place 30 mLs See admin instructions into feeding tube. Give 30ml (1 oz) once per day via tube. Flush with 60ml of water before and after  given.) 946 mL   . polyethylene glycol (MIRALAX) packet Take 17 g by mouth daily as needed. Available OTC (Patient taking differently: Take 17 g daily as needed by mouth for moderate constipation. ) 30 each 0  . potassium chloride (K-DUR) 10 MEQ tablet Dissolve 1 tablet in water and administer via PEG tube daily (Patient taking differently: Take 10 mEq See admin instructions by mouth. Dissolve 10 meq in water and administer via PEG tube daily) 90 tablet 3  . sodium fluoride (PREVIDENT 5000 PLUS) 1.1 % CREA dental cream Apply gel to tooth brush. Brush teeth for 2 minutes. Spit out excess-DO NOT swallow. Repeat nightly. (Patient taking differently: Place 1 application daily onto teeth. Apply gel to tooth brush. Brush teeth for 2 minutes. Spit out excess-DO NOT swallow. Repeat nightly.) 1 Tube prn  . torsemide (DEMADEX) 20 MG tablet Take 1 tablet (20 mg total) by mouth daily. 90 tablet 3  . warfarin (COUMADIN) 5 MG tablet TAKE AS DIRECTED BY ANTICOAGULATION CLINIC. (Patient taking differently: Take 5-7.5 mg See admin instructions by mouth. Take 7.5 mg by mouth daily on Wednesday. Take 5 mg by mouth daily on all other days) 105 tablet 1   No current facility-administered medications for this encounter.     Physical Findings: Wt Readings from Last 3 Encounters:  09/02/17 222 lb 2 oz (100.8 kg)  08/24/17 223 lb 4.8 oz (101.3 kg)  07/25/17 223 lb 5.2 oz (101.3 kg)    weight is 222 lb 2 oz (100.8 kg). His oral temperature is 97.9 F (36.6 C). His blood pressure is 98/59 (abnormal) and his pulse is 74. His respiration is 20 and oxygen saturation is 98%.   General: Alert and oriented, in no acute distress. HEENT: Head is normocephalic. Oral cavity and oropharynx are clear. Neck: Neck is notable for lymphedema in his anterior neck but no palpable lymphadenopathy. Skin: Skin in treatment fields shows satisfactory healing. Heart: Regular in rate and rhythm with no murmurs, rubs, or gallops. Chest: Clear to  auscultation bilaterally, with no rhonchi, wheezes, or rales. Psychiatric: Judgment and insight are intact. Affect is appropriate. MSK: patient has difficulty propping himself onto exam table; uses cane to walk Abd: + PEG tube  Lab Findings: Lab Results  Component Value Date   WBC 3.8 (L) 07/21/2017   HGB 13.2 07/21/2017   HCT 40.2 07/21/2017   MCV 95.0 07/21/2017   PLT 170 07/21/2017    Lab Results  Component Value Date   TSH 2.104 09/01/2017    Radiographic Findings: Ct Soft Tissue Neck W Contrast  Result Date: 09/01/2017 CLINICAL DATA:  Malignant neoplasm base of tongue. Follow-up post treatment. Radiation completed 01/24/2017 EXAM: CT NECK WITH CONTRAST TECHNIQUE: Multidetector CT imaging of the neck was performed using the standard protocol following the bolus administration of intravenous contrast. CONTRAST:  75mL ISOVUE-300  IOPAMIDOL (ISOVUE-300) INJECTION 61% COMPARISON:  CT neck 11/10/2016 FINDINGS: Pharynx and larynx: Base of tongue mass with significant improvement, now ill-defined and difficult to measure on the right side Radiation changes in the pharynx and larynx with mucosal and submucosal edema. Salivary glands: Atrophic and hyperenhancing submandibular gland bilaterally due to radiation. Negative parotid gland bilaterally. Thyroid: Negative Lymph nodes: Significant improvement in pathologic lymph nodes. Large anterior lymph node in the right lower neck is markedly improved, with ill-defined soft tissue edema in the area. No measurable lymph node in the area. Bilateral cervical adenopathy has regressed with low-density edema in the area of adenopathy on the right. Residual 9 mm lymph node in the left markedly improved. No new adenopathy. Vascular: Vascular calcification at the carotid bifurcation bilaterally. Limited intracranial: Negative Visualized orbits: Bilateral lens replacement.  No orbital mass Mastoids and visualized paranasal sinuses: Negative Skeleton: No acute  skeletal abnormality. Mild degenerative change cervical spine. Dental caries bilaterally. Upper chest: Negative for mass lesion. Probable mild scarring right apex. Other: Diffuse edema throughout the anterior soft tissues of the neck right greater than left due to radiation change. IMPRESSION: Significant improvement in base of tongue mass, with no measurable tumor remaining. Improvement in bilateral cervical adenopathy, no measurable adenopathy identified. Extensive post radiations changes involving the pharynx and larynx and anterior soft tissues of the neck. Electronically Signed   By: Marlan Palau M.D.   On: 09/01/2017 15:05    Impression/Plan:    1) Head and Neck Cancer Status: NED  2) Nutritional Status/PEG tube: weight is stable recently but he's lost a lot in the past year. He knows to instill more Osmolite and consume more calories for maintenance   3) Swallowing: He has some difficulty with swallowing. He saw the swallowing therapist but is not compliant with the exercises given.   4) Thyroid function:  WNL Lab Results  Component Value Date   TSH 2.104 09/01/2017    5) Other: Lymphedema and decreased muscular strength - I will make a referral to PT.   6) Follow-up with Dr. Ezzard Standing as planned. Follow-up with myself in 6 months. The patient was encouraged to call with any issues or questions before then.    _____________________________________   Lonie Peak, MD  This document serves as a record of services personally performed by Lonie Peak, MD. It was created on her behalf by Ivar Bury, a trained medical scribe. The creation of this record is based on the scribe's personal observations and the provider's statements to them. This document has been checked and approved by the attending provider.

## 2017-09-13 ENCOUNTER — Telehealth: Payer: Self-pay | Admitting: Family Medicine

## 2017-09-13 ENCOUNTER — Other Ambulatory Visit: Payer: Self-pay | Admitting: Internal Medicine

## 2017-09-13 MED ORDER — DIGOXIN 125 MCG PO TABS: 0.1250 mg | ORAL_TABLET | Freq: Every day | ORAL | 3 refills | Status: DC

## 2017-09-13 MED ORDER — TORSEMIDE 20 MG PO TABS: 20.0000 mg | ORAL_TABLET | Freq: Every day | ORAL | 3 refills | Status: DC

## 2017-09-13 MED ORDER — DILTIAZEM HCL 90 MG PO TABS: 90.0000 mg | ORAL_TABLET | Freq: Every day | ORAL | 3 refills | Status: DC

## 2017-09-13 NOTE — Telephone Encounter (Signed)
Copied from [place] [id number]. Topic: Inquiry >> Sep 13, 2017 12:02 PM Malena Catholic I, NT wrote: Reason for CRM: pt wife call and said they insurance change and his need new refill of his med Go to [facility] on [address] [phone]

## 2017-09-13 NOTE — Telephone Encounter (Signed)
I tried to call pt to ask what medication and there was no answer.

## 2017-09-13 NOTE — Telephone Encounter (Signed)
Pt's medication was sent to pt's pharmacy as requested, confirmation received.  

## 2017-09-14 ENCOUNTER — Ambulatory Visit (INDEPENDENT_AMBULATORY_CARE_PROVIDER_SITE_OTHER): Payer: Medicare HMO | Admitting: General Practice

## 2017-09-14 DIAGNOSIS — I480 Paroxysmal atrial fibrillation: Secondary | ICD-10-CM

## 2017-09-14 DIAGNOSIS — Z7901 Long term (current) use of anticoagulants: Secondary | ICD-10-CM | POA: Diagnosis not present

## 2017-09-14 LAB — POCT INR: INR: 1.3

## 2017-09-14 MED ORDER — CARVEDILOL 25 MG PO TABS: ORAL_TABLET | ORAL | 1 refills | Status: DC

## 2017-09-14 NOTE — Telephone Encounter (Signed)
Pt wife [name] states pt needs a med refill on his:  carvedilol (COREG) 25 MG tablet [[id number]]  warfarin (COUMADIN) 5 MG tablet [[id number]] and he would like a 90 day supply.  She states that the pt has been out of the carvedilol for a few days now.

## 2017-09-14 NOTE — Telephone Encounter (Signed)
Rx for carvedilol sent.  

## 2017-09-14 NOTE — Patient Instructions (Addendum)
.  lbpcmh  Take 2 tablets today (1/9) and take 1 1/2 tablets tomorrow (1/10) and then take 1 tablet daily except 1 1/2 tablets on Wednesday.  Re-check in 2 weeks.

## 2017-09-15 ENCOUNTER — Other Ambulatory Visit: Payer: Self-pay | Admitting: General Practice

## 2017-09-15 MED ORDER — WARFARIN SODIUM 5 MG PO TABS: ORAL_TABLET | ORAL | 1 refills | Status: DC

## 2017-09-19 ENCOUNTER — Ambulatory Visit (INDEPENDENT_AMBULATORY_CARE_PROVIDER_SITE_OTHER): Payer: Medicare HMO | Admitting: *Deleted

## 2017-09-19 DIAGNOSIS — I428 Other cardiomyopathies: Secondary | ICD-10-CM | POA: Diagnosis not present

## 2017-09-20 ENCOUNTER — Encounter: Payer: Self-pay | Admitting: Internal Medicine

## 2017-09-21 NOTE — Progress Notes (Signed)
Remote ICD transmission.   

## 2017-09-22 LAB — CUP PACEART REMOTE DEVICE CHECK
Battery Remaining Longevity: 144 mo
Battery Remaining Percentage: 100 %
Brady Statistic RV Percent Paced: 20 %
Date Time Interrogation Session: 20190114070100
HighPow Impedance: 43 Ohm
Implantable Lead Implant Date: 20060209
Implantable Lead Location: 753860
Implantable Lead Model: 158
Implantable Lead Serial Number: 156422
Implantable Pulse Generator Implant Date: 20150904
Lead Channel Impedance Value: 392 Ohm
Lead Channel Pacing Threshold Amplitude: 0.7 V
Lead Channel Pacing Threshold Pulse Width: 0.4 ms
Lead Channel Setting Pacing Amplitude: 2.4 V
Lead Channel Setting Pacing Pulse Width: 0.4 ms
Lead Channel Setting Sensing Sensitivity: 0.5 mV
Pulse Gen Serial Number: 191056

## 2017-09-23 ENCOUNTER — Encounter: Payer: Self-pay | Admitting: Cardiology

## 2017-09-28 ENCOUNTER — Ambulatory Visit (INDEPENDENT_AMBULATORY_CARE_PROVIDER_SITE_OTHER): Payer: Medicare HMO | Admitting: General Practice

## 2017-09-28 DIAGNOSIS — I480 Paroxysmal atrial fibrillation: Secondary | ICD-10-CM

## 2017-09-28 DIAGNOSIS — Z7901 Long term (current) use of anticoagulants: Secondary | ICD-10-CM | POA: Diagnosis not present

## 2017-09-28 LAB — POCT INR: INR: 1.9

## 2017-09-28 NOTE — Patient Instructions (Addendum)
Pre visit review using our clinic review tool, if applicable. No additional management support is needed unless otherwise documented below in the visit note.  Take 2 tablets today (1/23) and then take 1 tablet daily except 1 1/2 tablets on Wednesday and Saturday.  Re-check in 4 weeks.

## 2017-09-29 DIAGNOSIS — C140 Malignant neoplasm of pharynx, unspecified: Secondary | ICD-10-CM | POA: Diagnosis not present

## 2017-09-29 DIAGNOSIS — I428 Other cardiomyopathies: Secondary | ICD-10-CM | POA: Diagnosis not present

## 2017-10-26 ENCOUNTER — Ambulatory Visit (INDEPENDENT_AMBULATORY_CARE_PROVIDER_SITE_OTHER): Payer: Medicare HMO | Admitting: General Practice

## 2017-10-26 DIAGNOSIS — Z7901 Long term (current) use of anticoagulants: Secondary | ICD-10-CM | POA: Diagnosis not present

## 2017-10-26 DIAGNOSIS — I480 Paroxysmal atrial fibrillation: Secondary | ICD-10-CM

## 2017-10-26 LAB — POCT INR: INR: 2.1

## 2017-10-26 NOTE — Patient Instructions (Signed)
Pre visit review using our clinic review tool, if applicable. No additional management support is needed unless otherwise documented below in the visit note.  Continue to take 1 tablet daily except 1 1/2 tuesday and Saturday.  Re-check in 4 weeks.

## 2017-10-30 DIAGNOSIS — C140 Malignant neoplasm of pharynx, unspecified: Secondary | ICD-10-CM | POA: Diagnosis not present

## 2017-10-30 DIAGNOSIS — I428 Other cardiomyopathies: Secondary | ICD-10-CM | POA: Diagnosis not present

## 2017-11-10 ENCOUNTER — Encounter: Payer: Self-pay | Admitting: Internal Medicine

## 2017-11-10 ENCOUNTER — Ambulatory Visit: Payer: Self-pay | Admitting: Internal Medicine

## 2017-11-10 ENCOUNTER — Ambulatory Visit: Payer: Medicare HMO | Admitting: Internal Medicine

## 2017-11-10 ENCOUNTER — Ambulatory Visit (INDEPENDENT_AMBULATORY_CARE_PROVIDER_SITE_OTHER): Payer: Medicare HMO | Admitting: Internal Medicine

## 2017-11-10 VITALS — BP 114/62 | HR 62 | Ht 73.0 in | Wt 227.0 lb

## 2017-11-10 VITALS — BP 114/62 | HR 64 | Ht 73.0 in | Wt 227.0 lb

## 2017-11-10 DIAGNOSIS — I2510 Atherosclerotic heart disease of native coronary artery without angina pectoris: Secondary | ICD-10-CM

## 2017-11-10 DIAGNOSIS — I4819 Other persistent atrial fibrillation: Secondary | ICD-10-CM

## 2017-11-10 DIAGNOSIS — I481 Persistent atrial fibrillation: Secondary | ICD-10-CM | POA: Diagnosis not present

## 2017-11-10 DIAGNOSIS — R6889 Other general symptoms and signs: Secondary | ICD-10-CM | POA: Diagnosis not present

## 2017-11-10 DIAGNOSIS — I5022 Chronic systolic (congestive) heart failure: Secondary | ICD-10-CM | POA: Diagnosis not present

## 2017-11-10 DIAGNOSIS — I428 Other cardiomyopathies: Secondary | ICD-10-CM

## 2017-11-10 DIAGNOSIS — I480 Paroxysmal atrial fibrillation: Secondary | ICD-10-CM | POA: Diagnosis not present

## 2017-11-10 DIAGNOSIS — Z95810 Presence of automatic (implantable) cardiac defibrillator: Secondary | ICD-10-CM

## 2017-11-10 LAB — BASIC METABOLIC PANEL
BUN/Creatinine Ratio: 30 — ABNORMAL HIGH (ref 10–24)
BUN: 20 mg/dL (ref 8–27)
CO2: 25 mmol/L (ref 20–29)
Calcium: 9.4 mg/dL (ref 8.6–10.2)
Chloride: 103 mmol/L (ref 96–106)
Creatinine, Ser: 0.67 mg/dL — ABNORMAL LOW (ref 0.76–1.27)
GFR calc Af Amer: 108 mL/min/{1.73_m2} (ref >59)
GFR calc non Af Amer: 93 mL/min/{1.73_m2} (ref >59)
Glucose: 94 mg/dL (ref 65–99)
Potassium: 4.3 mmol/L (ref 3.5–5.2)
Sodium: 143 mmol/L (ref 134–144)

## 2017-11-10 LAB — LIPID PANEL
Chol/HDL Ratio: 2.7 ratio (ref 0.0–5.0)
Cholesterol, Total: 175 mg/dL (ref 100–199)
HDL: 65 mg/dL (ref >39)
LDL Calculated: 89 mg/dL (ref 0–99)
Triglycerides: 105 mg/dL (ref 0–149)
VLDL Cholesterol Cal: 21 mg/dL (ref 5–40)

## 2017-11-10 LAB — CBC
Hematocrit: 41.5 % (ref 37.5–51.0)
Hemoglobin: 13.8 g/dL (ref 13.0–17.7)
MCH: 31.2 pg (ref 26.6–33.0)
MCHC: 33.3 g/dL (ref 31.5–35.7)
MCV: 94 fL (ref 79–97)
Platelets: 171 10*3/uL (ref 150–379)
RBC: 4.43 x10E6/uL (ref 4.14–5.80)
RDW: 14.4 % (ref 12.3–15.4)
WBC: 3.2 10*3/uL — ABNORMAL LOW (ref 3.4–10.8)

## 2017-11-10 LAB — PRO B NATRIURETIC PEPTIDE: NT-Pro BNP: 1116 pg/mL — ABNORMAL HIGH (ref 0–486)

## 2017-11-10 LAB — DIGOXIN LEVEL: Digoxin, Serum: 0.7 ng/mL (ref 0.5–0.9)

## 2017-11-10 NOTE — Patient Instructions (Signed)

## 2017-11-10 NOTE — Patient Instructions (Addendum)
Your physician recommends that you continue on your current medications as directed. Please refer to the Current Medication list given to you today. Your physician recommends that you return for lab work in today (BNP, BMET, LIPIDS, DIG, CBC) Your physician wants you to follow-up in: [place].  You will receive a reminder letter in the mail two months in advance. If you don't receive a letter, please call our office to schedule the follow-up appointment.  Addendum: referral has been placed to neurology, Dr. Carles Collet per [name] for forgetfulness, r/o demetia.

## 2017-11-10 NOTE — Progress Notes (Signed)
HPI Mr. 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 He is a pleasant 77 yo man with the above problems who also has chronic atrial fib. In the interim, he has undergone neck surgery due to tongue cancer with mets and recent lymph node dissection. He has been using a feeding tube and has just started back eating. He c/o significant peripheral edema. He admits to some dietary indiscretion with sodium. No chest pain. He sleeps in a chair so as not to disrupt his PEG tube. No ICD shocks or palpitations. Allergies  Allergen Reactions  . Niaspan [Niacin Er] Other (See Comments)    REACTION NOT AN ALLERGY >  FLUSHED, EXPECTED W/NIACIN     Current Outpatient Medications  Medication Sig Dispense Refill  . acetaminophen (TYLENOL) 500 MG tablet Take 1,000 mg by mouth every 6 (six) hours as needed for moderate pain.    . carvedilol (COREG) 25 MG tablet Take 25 mg by mouth twice daily 180 tablet 1  . digoxin (LANOXIN) 0.125 MG tablet Take 1 tablet (0.125 mg total) by mouth daily. 90 tablet 3  . diltiazem (CARDIZEM) 90 MG tablet Take 1 tablet (90 mg total) by mouth daily. 90 tablet 3  . GuaiFENesin (TUSSIN PO) Take 10 mLs at bedtime as needed by mouth (congestion).    . Nutritional Supplements (FEEDING SUPPLEMENT, OSMOLITE 1.5 CAL,) LIQD Give 2 cans of formula at 8am and 12 noon. Give additional 1 1/2 cans at 4pm and 8pm feeding.   Flush with 20ml of water before and after feeding 4 times per day (Patient taking differently: Place 355-574 mLs See admin instructions into feeding tube. Give 2 cans of formula at 8am and 12 noon. Give additional 1 1/2 cans at 4pm and 8pm feeding.   Flush with 72ml of water before and after feeding 4 times per day) 1659 mL 0  . Nutritional Supplements (PROMOD) LIQD Give 58ml (1 oz) 2 times per day via tube. Flush with 84ml of water before and after given. (Patient taking differently: Place 30 mLs See admin instructions into feeding  tube. Give 2ml (1 oz) once per day via tube. Flush with 22ml of water before and after given.) 946 mL   . polyethylene glycol (MIRALAX) packet Take 17 g by mouth daily as needed. Available OTC (Patient taking differently: Take 17 g daily as needed by mouth for moderate constipation. ) 30 each 0  . potassium chloride (K-DUR) 10 MEQ tablet Dissolve 1 tablet in water and administer via PEG tube daily (Patient taking differently: Take 10 mEq See admin instructions by mouth. Dissolve 10 meq in water and administer via PEG tube daily) 90 tablet 3  . sodium fluoride (PREVIDENT 5000 PLUS) 1.1 % CREA dental cream Apply gel to tooth brush. Brush teeth for 2 minutes. Spit out excess-DO NOT swallow. Repeat nightly. (Patient taking differently: Place 1 application daily onto teeth. Apply gel to tooth brush. Brush teeth for 2 minutes. Spit out excess-DO NOT swallow. Repeat nightly.) 1 Tube prn  . torsemide (DEMADEX) 20 MG tablet Take 1 tablet (20 mg total) by mouth daily. 90 tablet 3  . warfarin (COUMADIN) 5 MG tablet TAKE AS DIRECTED BY ANTICOAGULATION CLINIC. 105 tablet 1   No current facility-administered medications for this visit.      Past Medical History:  Diagnosis Date  . AICD (automatic cardioverter/defibrillator) present   . Atrial fibrillation ([place])   . Cancer ([place])    tongue  cancer  . 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 CHF (congestive heart failure) ([place])   . Claustrophobia   . Diabetes mellitus   . Dysrhythmia   . Heart disease   . History of echocardiogram    Echo 8/18:  EF 55-60, no RWMA, severe LAE, normal RVSF, mild RAE  . History of radiation therapy 12/07/16- 01/24/17   Base of Tongue 70 Gy in 35 fractions  . Hyperlipidemia   . OSA (obstructive sleep apnea)   . Shingles   . Umbilical hernia   . 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 It sometimes shows as a shadow on scans.     ROS:   All systems reviewed and  negative except as noted in the HPI.   Past Surgical History:  Procedure Laterality Date  . CARDIAC DEFIBRILLATOR PLACEMENT  2006  . CATARACT EXTRACTION Bilateral    01/07/2016. 01/22/2016  . CYST EXCISION     multiple drainage to a cyst in his neck.   Marland Kitchen DIRECT LARYNGOSCOPY N/A 11/16/2016   Procedure: DIRECT LARYNGOSCOPY AND BIOPSY;  Surgeon: Rozetta Nunnery, MD;  Location: [place];  Service: ENT;  Laterality: N/A;  . EYE SURGERY    . HERNIA REPAIR     1950's  . IMPLANTABLE CARDIOVERTER DEFIBRILLATOR (ICD) GENERATOR CHANGE N/A 05/10/2014   Procedure: ICD GENERATOR CHANGE;  Surgeon: [name], MD;  Location: [facility] CATH LAB;  Service: Cardiovascular;  Laterality: N/A;  . IR GASTROSTOMY TUBE MOD SED  12/27/2016  . IR PATIENT EVAL TECH 0-60 MINS  01/17/2017  . MASS EXCISION Right 11/16/2016   Procedure: EXCISION RIGHT NECK LIPOMA;  Surgeon: Rozetta Nunnery, MD;  Location: [place];  Service: ENT;  Laterality: Right;  . MOHS SURGERY  01/01/2015   to top of head  . MOUTH SURGERY     teeth removed 4 teeth extracted 02/2010  . RADICAL NECK DISSECTION Right 07/25/2017   Procedure: RIGHT NECK DISSECTION;  Surgeon: Rozetta Nunnery, MD;  Location: [facility];  Service: ENT;  Laterality: Right;     Family History  Problem Relation Age of Onset  . Heart disease Mother   . Sudden death Mother        under age 59  . Lupus Mother   . Hypertension Father   . Stroke Father      Social History   Socioeconomic History  . Marital status: Married    Spouse name: [name]  . Number of children: 3  . Years of education: Not on file  . Highest education level: Not on file  Social Needs  . Financial resource strain: Not on file  . Food insecurity - worry: Not on file  . Food insecurity - inability: Not on file  . Transportation needs - medical: Not on file  . Transportation needs - non-medical: Not on file  Occupational History  . Occupation: Retired Animal nutritionist  Tobacco Use  .  Smoking status: Never Smoker  . Smokeless tobacco: Never Used  Substance and Sexual Activity  . Alcohol use: Yes    Comment: he has a past history of social drinking  . Drug use: No  . Sexual activity: Not on file  Other Topics Concern  . Not on file  Social History Narrative  . Not on file     BP 114/62   Pulse 64   Ht 6\' 1"  (1.854 m)   Wt 227  lb (103 kg)   BMI 29.95 kg/m   Physical Exam:  Well appearing NAD HEENT: Unremarkable Neck:  No JVD, no thyromegally Lymphatics:  No adenopathy Back:  No CVA tenderness Lungs:  Clear HEART:  Regular rate rhythm, no murmurs, no rubs, no clicks Abd:  soft, positive bowel sounds, no organomegally, no rebound, no guarding Ext:  2 plus pulses, 3+ edema, no cyanosis, no clubbing Skin:  No rashes no nodules Neuro:  CN II through XII intact, motor grossly intact  EKG - atrial fib with a  Slow VR  DEVICE  Normal device function.  See PaceArt for details.   Assess/Plan: 1. Atrial fib - his ventricular rate is well controlled. Will follow. 2. VT - he has had a single episode of treated VT. Occurred over a year ago. I have reprogrammed his device to require his therapy to only be given after 10 seconds of VT and 5 seconds of VF.  3. ICD - his [employer] single chamber ICD is working normally. Will follow. 4. Tongue CA- I have discussed the importance of removal of his PEG tube as soon as possible to reduce the risk of device infection.  5. Chronic systolic heart failure - his symptoms are class2-3 and he has a lot of peripheral edema. I have asked the patient to sleep with his feet elevated.  Mikle Bosworth.D.

## 2017-11-10 NOTE — Progress Notes (Signed)
Cardiology Office Note:    Date:  11/10/2017   ID:  [name], DOB 1941/06/17, MRN [id number]  PCP:  [name], MD  Cardiologist:  [name]   Electrophysiologist: [name]    Referring MD: [name], MD   F/U of chronic systolic CHF and chronic atrial fib  History of Present Illness:    Collin [name] is a 77 y.o. male with a hx of  NICM, systolic heart failure, atrial fibrillation, diabetes, hyperlipidemia, sleep apnea.   He has a history of normal coronary arteries by cardiac catheterization in 2002. Prior EF was 28%. He is status post ICD and is followed by Dr. [name].  Echocardiogram in 2015 demonstrated normal LV function with an EF of 55-60%.  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  He has a feeding tube. He was seen by [name] in October  Diuretic adjusted    I saw the patient in NOvember 2018 SInce seen he says his breathing is good   He is actually doing yard work   He denies CP   No palpitations   No dizziness Does have chronic LE edema    Beginning to eat  Still has feeding tube   Bothered by ventral hernia  Wife concerned about early dementia    Prior CV studies:   The following studies were reviewed today:  PET Scan 05/17/17 IMPRESSION: 1. Response to therapy. 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 2. No evidence of extracervical metastatic disease. 3. Heterogeneous hepatic activity is nonspecific. Concurrent caudate lobe enlargement and gynecomastia. Correlate with risk factors for cirrhosis. Consider dedicated abdominal imaging, ideally with pre and post contrast abdominal MRI. 4. Cardiomegaly with suspicion of elevated right heart pressures. Pulmonary artery enlargement suggests pulmonary arterial hypertension. 5. Coronary artery atherosclerosis. Aortic Atherosclerosis (ICD10-I70.0). 6. Cholelithiasis. 7. Prostatomegaly with bladder outlet  obstruction. 8. Fat containing ventral pelvic wall hernia. 9. Pulmonary artery enlargement suggests pulmonary arterial Hypertension.  Echocardiogram 04/28/17 EF 55-60, normal wall motion, severe LAE, normal RVSF, mild RAE  LE venous US 03/24/17 No evidence of lower extremity deep or superficial venous thrombus or incompetence, bilaterally.  Echo 04/22/14 Mod conc LVH, EF 55-60, no RWMA, mildly dilated ascending aorta (root 40 mm, ascending aorta 37 mm), severe LAE, severe RAE, lipoma hypertrophy of the Atrial septum, mild TR  Echo 04/17/13 Moderate LVH, EF 35-40, diffuse HK, moderate to severe LAE, mild RAE  Echo 5/12 Moderate LVH, EF 50-55, moderate LAE, mild RAE  Past Medical History:  Diagnosis Date  . AICD (automatic cardioverter/defibrillator) present   . Atrial fibrillation ([facility])   . Cancer ([facility])    tongue cancer  . Cardiomyopathy, nonischemic (H[facility]) 1998  . CHF (congestive heart failure) ([facility])   . Claustrophobia   . Diabetes mellitus   . Dysrhythmia   . Heart disease   . History of echocardiogram    Echo 8/18:  EF 55-60, no RWMA, severe LAE, normal RVSF, mild RAE  . History of radiation therapy 12/07/16- 01/24/17   Base of Tongue 70 Gy in 35 fractions  . Hyperlipidemia   . OSA (obstructive sleep apnea)   . Shingles   . Umbilical hernia   . 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 It sometimes shows as a shadow on scans.     Past Surgical History:  Procedure Laterality  Date  . CARDIAC DEFIBRILLATOR PLACEMENT  2006  . CATARACT EXTRACTION Bilateral    01/07/2016. 01/22/2016  . CYST EXCISION     multiple drainage to a cyst in his neck.   Marland Kitchen DIRECT LARYNGOSCOPY N/A 11/16/2016   Procedure: DIRECT LARYNGOSCOPY AND BIOPSY;  Surgeon: [name], MD;  Location: [facility] OR;  Service: ENT;  Laterality: N/A;  . EYE SURGERY    . HERNIA REPAIR     1950's  . IMPLANTABLE CARDIOVERTER DEFIBRILLATOR (ICD) GENERATOR CHANGE N/A  05/10/2014   Procedure: ICD GENERATOR CHANGE;  Surgeon: [name], MD;  Location: [facility] CATH LAB;  Service: Cardiovascular;  Laterality: N/A;  . IR GASTROSTOMY TUBE MOD SED  12/27/2016  . IR PATIENT EVAL TECH 0-60 MINS  01/17/2017  . MASS EXCISION Right 11/16/2016   Procedure: EXCISION RIGHT NECK LIPOMA;  Surgeon: [name], MD;  Location[facility] OR;  Service: ENT;  Laterality: Right;  . MOHS SURGERY  01/01/2015   to top of head  . MOUTH SURGERY     teeth removed 4 teeth extracted 02/2010  . RADICAL NECK DISSECTION Right 07/25/2017   Procedure: RIGHT NECK DISSECTION;  Surgeon: [name], MD;  Location: [facility] OR;  Service: ENT;  Laterality: Right;    Current Medications: Current Meds  Medication Sig  . acetaminophen (TYLENOL) 500 MG tablet Take 1,000 mg by mouth every 6 (six) hours as needed for moderate pain.  . carvedilol (COREG) 25 MG tablet Take 25 mg by mouth twice daily  . digoxin (LANOXIN) 0.125 MG tablet Take 1 tablet (0.125 mg total) by mouth daily.  Marland Kitchen diltiazem (CARDIZEM) 90 MG tablet Take 1 tablet (90 mg total) by mouth daily.  . GuaiFENesin (TUSSIN PO) Take 10 mLs at bedtime as needed by mouth (congestion).  . Nutritional Supplements (FEEDING SUPPLEMENT, OSMOLITE 1.5 CAL,) LIQD Give 2 cans of formula at 8am and 12 noon. Give additional 1 1/2 cans at 4pm and 8pm feeding.   Flush with 60ml of water before and after feeding 4 times per day (Patient taking differently: Place 355-574 mLs See admin instructions into feeding tube. Give 2 cans of formula at 8am and 12 noon. Give additional 1 1/2 cans at 4pm and 8pm feeding.   Flush with 60ml of water before and after feeding 4 times per day)  . Nutritional Supplements (PROMOD) LIQD Give 30ml (1 oz) 2 times per day via tube. Flush with 60ml of water before and after given. (Patient taking differently: Place 30 mLs See admin instructions into feeding tube. Give 30ml (1 oz) once per day via tube. Flush with 60ml of water before and  after given.)  . polyethylene glycol (MIRALAX) packet Take 17 g by mouth daily as needed. Available OTC (Patient taking differently: Take 17 g daily as needed by mouth for moderate constipation. )  . potassium chloride (K-DUR) 10 MEQ tablet Dissolve 1 tablet in water and administer via PEG tube daily (Patient taking differently: Take 10 mEq See admin instructions by mouth. Dissolve 10 meq in water and administer via PEG tube daily)  . sodium fluoride (PREVIDENT 5000 PLUS) 1.1 % CREA dental cream Apply gel to tooth brush. Brush teeth for 2 minutes. Spit out excess-DO NOT swallow. Repeat nightly. (Patient taking differently: Place 1 application daily onto teeth. Apply gel to tooth brush. Brush teeth for 2 minutes. Spit out excess-DO NOT swallow. Repeat nightly.)  . torsemide (DEMADEX) 20 MG tablet Take 1 tablet (20 mg total) by mouth daily.  Marland Kitchen  warfarin (COUMADIN) 5 MG tablet TAKE AS DIRECTED BY ANTICOAGULATION CLINIC.     Allergies:   Niaspan [niacin er]   Social History   Socioeconomic History  . Marital status: Married    Spouse name: [name]  . Number of children: 3  . Years of education: None  . Highest education level: None  Social Needs  . Financial resource strain: None  . Food insecurity - worry: None  . Food insecurity - inability: None  . Transportation needs - medical: None  . Transportation needs - non-medical: None  Occupational History  . Occupation: Retired Engineer, materials  Tobacco Use  . Smoking status: Never Smoker  . Smokeless tobacco: Never Used  Substance and Sexual Activity  . Alcohol use: Yes    Comment: he has a past history of social drinking  . Drug use: No  . Sexual activity: None  Other Topics Concern  . None  Social History Narrative  . None     Family Hx: The patient's family history includes Heart disease in his mother; Hypertension in his father; Lupus in his mother; Stroke in his father; Sudden death in his mother.  ROS:    EKGs/Labs/Other  Test Reviewed:    EKG:  EKG is not ordered today.    Recent Labs: 03/11/2017: ALT 21 06/08/2017: Magnesium 2.1 06/15/2017: NT-Pro BNP 1,015 07/21/2017: Hemoglobin 13.2; Platelets 170; Potassium 4.1; Sodium 139 09/01/2017: BUN 22.3; Creatinine 0.8; TSH 2.104   Recent Lipid Panel Lab Results  Component Value Date/Time   CHOL 156 09/16/2016 02:44 PM   TRIG 324 (H) 09/16/2016 02:44 PM   HDL 42 09/16/2016 02:44 PM   CHOLHDL 3.7 09/16/2016 02:44 PM   CHOLHDL 5 11/13/2015 03:25 PM   LDLCALC 49 09/16/2016 02:44 PM   LDLDIRECT 72.0 11/13/2015 03:25 PM    Physical Exam:    VS:  BP 114/62   Pulse 62   Ht 6\' 1"  (1.854 m)   Wt 227 lb (103 kg)   SpO2 98%   BMI 29.95 kg/m      Pt is in NAD    Neck:  JVP normal  Lungs Clear   Ccardiac Irreg Irreg  No signif murmurs   No S3    Abdomen Supple  No hepatommegaly  Feeding tube in place  Hernia at umbilicus Ext  1+ edema L greater than R  Ankles deformed    Wt Readings from Last 3 Encounters:  11/10/17 227 lb (103 kg)  09/02/17 222 lb 2 oz (100.8 kg)  08/24/17 223 lb 4.8 oz (101.3 kg)       ASSESSMENT:    No diagnosis found. PLAN:     1. (HFpEF) heart failure with preserved ejection fraction (HCC) EF is normal. On most recent echo.   Will check labs today   Legs are a little more swollen  Did recomm he watch salt more    2  CAD  No symptoms of angina  Contnue to follow     3. Persistent atrial fibrillation (HCC)  Rate control and coumadin  They will check on copay for Xarelto and Eliquis    4  ICD (implantable cardioverter-defibrillator) in place  Follow with G Taylor  5. Malignant neoplasm of base of tongue (HCC)  Followed in oncology  And C Newman    6  Question dementia  WOUld refer to Dr Tat  Dispo:  F/U in August   2019  Medication Adjustments/Labs and Tests Ordered: Current medicines are reviewed at length with  the patient today.  Concerns regarding medicines are outlined above.  Tests Ordered: No orders of  the defined types were placed in this encounter.  Medication Changes: No orders of the defined types were placed in this encounter.   Signed, Dietrich Pates, MD  11/10/2017 10:35 AM    [facility] [address] [place], [place], [place]  [address] Phone: [phone]; Fax: [phone]

## 2017-11-11 ENCOUNTER — Telehealth: Payer: Self-pay

## 2017-11-11 DIAGNOSIS — I5022 Chronic systolic (congestive) heart failure: Secondary | ICD-10-CM

## 2017-11-11 MED ORDER — ROSUVASTATIN CALCIUM 10 MG PO TABS: 10.0000 mg | ORAL_TABLET | Freq: Every day | ORAL | 3 refills | Status: DC

## 2017-11-11 NOTE — Telephone Encounter (Signed)
Spoke with patients wife Clarene Critchley, she is aware of patients lab results and medication changes per [name]. Follow up appointments have been made and she is also aware of these. New medication has been called into pharmacy.

## 2017-11-11 NOTE — Telephone Encounter (Signed)
-----   Message from Fay Records, MD sent at 11/11/2017 12:42 PM EST ----- CBC is normal  Electrolytes and kidney function ar normal    LDL is 89  With extensive chol plaquiing in aorta needs to be lower   Would recomm Crestor 10 mg   F/Ulipids in 3 months   Fluid is up  # would recomm taking 2 Demedex every 3rd day (40 mg)  F/U BMET and BNP in 3 wks  DIgoxin level OK   I am not sure adding much to Heart rate control  Cut in 1/2  Follow  Arrange f/u in 2 months with me

## 2017-11-12 ENCOUNTER — Emergency Department (HOSPITAL_COMMUNITY): Payer: Medicare HMO

## 2017-11-12 ENCOUNTER — Other Ambulatory Visit: Payer: Self-pay

## 2017-11-12 ENCOUNTER — Encounter (HOSPITAL_COMMUNITY): Payer: Self-pay

## 2017-11-12 ENCOUNTER — Inpatient Hospital Stay (HOSPITAL_COMMUNITY)
Admission: EM | Admit: 2017-11-12 | Discharge: 2017-11-18 | DRG: 389 | Disposition: A | Discharge status: Home / Self Care | Payer: Medicare HMO | Attending: Internal Medicine | Admitting: Internal Medicine

## 2017-11-12 DIAGNOSIS — Z888 Allergy status to other drugs, medicaments and biological substances status: Secondary | ICD-10-CM

## 2017-11-12 DIAGNOSIS — N179 Acute kidney failure, unspecified: Secondary | ICD-10-CM | POA: Diagnosis present

## 2017-11-12 DIAGNOSIS — E876 Hypokalemia: Secondary | ICD-10-CM | POA: Diagnosis present

## 2017-11-12 DIAGNOSIS — Z85810 Personal history of malignant neoplasm of tongue: Secondary | ICD-10-CM | POA: Diagnosis not present

## 2017-11-12 DIAGNOSIS — K56600 Partial intestinal obstruction, unspecified as to cause: Secondary | ICD-10-CM | POA: Diagnosis not present

## 2017-11-12 DIAGNOSIS — Z931 Gastrostomy status: Secondary | ICD-10-CM | POA: Diagnosis not present

## 2017-11-12 DIAGNOSIS — C01 Malignant neoplasm of base of tongue: Secondary | ICD-10-CM | POA: Diagnosis not present

## 2017-11-12 DIAGNOSIS — I4891 Unspecified atrial fibrillation: Secondary | ICD-10-CM | POA: Diagnosis not present

## 2017-11-12 DIAGNOSIS — F40240 Claustrophobia: Secondary | ICD-10-CM | POA: Diagnosis present

## 2017-11-12 DIAGNOSIS — E119 Type 2 diabetes mellitus without complications: Secondary | ICD-10-CM | POA: Diagnosis present

## 2017-11-12 DIAGNOSIS — K56699 Other intestinal obstruction unspecified as to partial versus complete obstruction: Secondary | ICD-10-CM | POA: Diagnosis not present

## 2017-11-12 DIAGNOSIS — I480 Paroxysmal atrial fibrillation: Secondary | ICD-10-CM | POA: Diagnosis not present

## 2017-11-12 DIAGNOSIS — K429 Umbilical hernia without obstruction or gangrene: Secondary | ICD-10-CM | POA: Diagnosis present

## 2017-11-12 DIAGNOSIS — K56609 Unspecified intestinal obstruction, unspecified as to partial versus complete obstruction: Secondary | ICD-10-CM

## 2017-11-12 DIAGNOSIS — Z7901 Long term (current) use of anticoagulants: Secondary | ICD-10-CM

## 2017-11-12 DIAGNOSIS — Z6829 Body mass index (BMI) 29.0-29.9, adult: Secondary | ICD-10-CM

## 2017-11-12 DIAGNOSIS — Z8249 Family history of ischemic heart disease and other diseases of the circulatory system: Secondary | ICD-10-CM | POA: Diagnosis not present

## 2017-11-12 DIAGNOSIS — Z923 Personal history of irradiation: Secondary | ICD-10-CM

## 2017-11-12 DIAGNOSIS — I428 Other cardiomyopathies: Secondary | ICD-10-CM | POA: Diagnosis present

## 2017-11-12 DIAGNOSIS — K5651 Intestinal adhesions [bands], with partial obstruction: Principal | ICD-10-CM | POA: Diagnosis present

## 2017-11-12 DIAGNOSIS — E440 Moderate protein-calorie malnutrition: Secondary | ICD-10-CM | POA: Diagnosis not present

## 2017-11-12 DIAGNOSIS — Z9842 Cataract extraction status, left eye: Secondary | ICD-10-CM | POA: Diagnosis not present

## 2017-11-12 DIAGNOSIS — G4733 Obstructive sleep apnea (adult) (pediatric): Secondary | ICD-10-CM | POA: Diagnosis present

## 2017-11-12 DIAGNOSIS — D649 Anemia, unspecified: Secondary | ICD-10-CM | POA: Diagnosis present

## 2017-11-12 DIAGNOSIS — Z95810 Presence of automatic (implantable) cardiac defibrillator: Secondary | ICD-10-CM | POA: Diagnosis not present

## 2017-11-12 DIAGNOSIS — Z832 Family history of diseases of the blood and blood-forming organs and certain disorders involving the immune mechanism: Secondary | ICD-10-CM | POA: Diagnosis not present

## 2017-11-12 DIAGNOSIS — I5032 Chronic diastolic (congestive) heart failure: Secondary | ICD-10-CM | POA: Diagnosis present

## 2017-11-12 DIAGNOSIS — E785 Hyperlipidemia, unspecified: Secondary | ICD-10-CM | POA: Diagnosis present

## 2017-11-12 DIAGNOSIS — Z823 Family history of stroke: Secondary | ICD-10-CM | POA: Diagnosis not present

## 2017-11-12 DIAGNOSIS — Z9841 Cataract extraction status, right eye: Secondary | ICD-10-CM | POA: Diagnosis not present

## 2017-11-12 DIAGNOSIS — Z0189 Encounter for other specified special examinations: Secondary | ICD-10-CM | POA: Diagnosis not present

## 2017-11-12 DIAGNOSIS — I4819 Other persistent atrial fibrillation: Secondary | ICD-10-CM | POA: Diagnosis present

## 2017-11-12 DIAGNOSIS — R1110 Vomiting, unspecified: Secondary | ICD-10-CM | POA: Diagnosis not present

## 2017-11-12 DIAGNOSIS — R791 Abnormal coagulation profile: Secondary | ICD-10-CM

## 2017-11-12 DIAGNOSIS — R109 Unspecified abdominal pain: Secondary | ICD-10-CM | POA: Diagnosis present

## 2017-11-12 LAB — COMPREHENSIVE METABOLIC PANEL
ALT: 57 U/L (ref 17–63)
AST: 34 U/L (ref 15–41)
Albumin: 4.2 g/dL (ref 3.5–5.0)
Alkaline Phosphatase: 149 U/L — ABNORMAL HIGH (ref 38–126)
Anion gap: 16 — ABNORMAL HIGH (ref 5–15)
BUN: 36 mg/dL — ABNORMAL HIGH (ref 6–20)
CO2: 30 mmol/L (ref 22–32)
Calcium: 9.9 mg/dL (ref 8.9–10.3)
Chloride: 96 mmol/L — ABNORMAL LOW (ref 101–111)
Creatinine, Ser: 1.16 mg/dL (ref 0.61–1.24)
GFR calc Af Amer: >60 mL/min (ref >60)
GFR calc non Af Amer: 59 mL/min — ABNORMAL LOW (ref >60)
Glucose, Bld: 158 mg/dL — ABNORMAL HIGH (ref 65–99)
Potassium: 4 mmol/L (ref 3.5–5.1)
Sodium: 142 mmol/L (ref 135–145)
Total Bilirubin: 0.8 mg/dL (ref 0.3–1.2)
Total Protein: 8.3 g/dL — ABNORMAL HIGH (ref 6.5–8.1)

## 2017-11-12 LAB — CBC
HCT: 47.5 % (ref 39.0–52.0)
Hemoglobin: 16.1 g/dL (ref 13.0–17.0)
MCH: 31.7 pg (ref 26.0–34.0)
MCHC: 33.9 g/dL (ref 30.0–36.0)
MCV: 93.5 fL (ref 78.0–100.0)
Platelets: 235 10*3/uL (ref 150–400)
RBC: 5.08 MIL/uL (ref 4.22–5.81)
RDW: 14.5 % (ref 11.5–15.5)
WBC: 4.1 10*3/uL (ref 4.0–10.5)

## 2017-11-12 LAB — URINALYSIS, ROUTINE W REFLEX MICROSCOPIC
Bacteria, UA: NONE SEEN
Glucose, UA: NEGATIVE mg/dL
Hgb urine dipstick: NEGATIVE
Ketones, ur: 5 mg/dL — AB
Leukocytes, UA: NEGATIVE
Nitrite: NEGATIVE
Protein, ur: 100 mg/dL — AB
Specific Gravity, Urine: 1.024 (ref 1.005–1.030)
pH: 5 (ref 5.0–8.0)

## 2017-11-12 LAB — I-STAT CG4 LACTIC ACID, ED: Lactic Acid, Venous: 2.16 mmol/L (ref 0.5–1.9)

## 2017-11-12 LAB — PROTIME-INR
INR: 3.53
Prothrombin Time: 35.1 seconds — ABNORMAL HIGH (ref 11.4–15.2)

## 2017-11-12 LAB — LIPASE, BLOOD: Lipase: 27 U/L (ref 11–51)

## 2017-11-12 MED ORDER — SODIUM CHLORIDE 0.9 % IV SOLN
INTRAVENOUS | Status: DC
  Administered 2017-11-13: 08:00:00 via INTRAVENOUS

## 2017-11-12 MED ORDER — FENTANYL CITRATE (PF) 100 MCG/2ML IJ SOLN
50.0000 ug | Freq: Once | INTRAMUSCULAR | Status: AC
  Administered 2017-11-12: 50 ug via INTRAVENOUS
  Filled 2017-11-12: qty 2

## 2017-11-12 MED ORDER — SODIUM CHLORIDE 0.9 % IJ SOLN
INTRAMUSCULAR | Status: AC
  Filled 2017-11-12: qty 50

## 2017-11-12 MED ORDER — DILTIAZEM HCL 25 MG/5ML IV SOLN
20.0000 mg | Freq: Once | INTRAVENOUS | Status: AC
  Administered 2017-11-13: 20 mg via INTRAVENOUS
  Filled 2017-11-12: qty 5

## 2017-11-12 MED ORDER — ACETAMINOPHEN 650 MG RE SUPP: 650.0000 mg | Freq: Four times a day (QID) | RECTAL | Status: DC | PRN

## 2017-11-12 MED ORDER — SODIUM CHLORIDE 0.9% FLUSH
3.0000 mL | Freq: Two times a day (BID) | INTRAVENOUS | Status: DC
  Administered 2017-11-13 – 2017-11-18 (×3): 3 mL via INTRAVENOUS

## 2017-11-12 MED ORDER — SODIUM CHLORIDE 0.9 % IV SOLN
Freq: Once | INTRAVENOUS | Status: AC
  Administered 2017-11-12: 23:00:00 via INTRAVENOUS

## 2017-11-12 MED ORDER — ACETAMINOPHEN 325 MG PO TABS: 650.0000 mg | ORAL_TABLET | Freq: Four times a day (QID) | ORAL | Status: DC | PRN

## 2017-11-12 MED ORDER — FAMOTIDINE IN NACL 20-0.9 MG/50ML-% IV SOLN
20.0000 mg | Freq: Two times a day (BID) | INTRAVENOUS | Status: DC
  Administered 2017-11-13 – 2017-11-18 (×12): 20 mg via INTRAVENOUS
  Filled 2017-11-12 (×13): qty 50

## 2017-11-12 MED ORDER — PROMETHAZINE HCL 25 MG PO TABS: 12.5000 mg | ORAL_TABLET | Freq: Four times a day (QID) | ORAL | Status: DC | PRN

## 2017-11-12 MED ORDER — DILTIAZEM HCL 25 MG/5ML IV SOLN
10.0000 mg | INTRAVENOUS | Status: DC
  Administered 2017-11-13 – 2017-11-16 (×21): 10 mg via INTRAVENOUS
  Filled 2017-11-12 (×23): qty 5

## 2017-11-12 MED ORDER — ONDANSETRON HCL 4 MG/2ML IJ SOLN
4.0000 mg | Freq: Once | INTRAMUSCULAR | Status: AC
  Administered 2017-11-12: 4 mg via INTRAVENOUS
  Filled 2017-11-12: qty 2

## 2017-11-12 MED ORDER — IOPAMIDOL (ISOVUE-300) INJECTION 61%
INTRAVENOUS | Status: AC
  Administered 2017-11-12: 100 mL
  Filled 2017-11-12: qty 100

## 2017-11-12 MED ORDER — SODIUM CHLORIDE 0.9 % IV BOLUS (SEPSIS)
500.0000 mL | Freq: Once | INTRAVENOUS | Status: AC
  Administered 2017-11-12: 500 mL via INTRAVENOUS

## 2017-11-12 MED ORDER — HYDRALAZINE HCL 20 MG/ML IJ SOLN: 10.0000 mg | INTRAMUSCULAR | Status: DC | PRN

## 2017-11-12 MED ORDER — ONDANSETRON HCL 4 MG/2ML IJ SOLN
4.0000 mg | Freq: Once | INTRAMUSCULAR | Status: AC
  Administered 2017-11-12: 2 mg via INTRAVENOUS
  Filled 2017-11-12: qty 2

## 2017-11-12 MED ORDER — FENTANYL CITRATE (PF) 100 MCG/2ML IJ SOLN: 25.0000 ug | INTRAMUSCULAR | Status: DC | PRN

## 2017-11-12 MED ORDER — ONDANSETRON HCL 4 MG/2ML IJ SOLN: 4.0000 mg | Freq: Four times a day (QID) | INTRAMUSCULAR | Status: DC | PRN

## 2017-11-12 NOTE — ED Triage Notes (Signed)
States for about 3 days abdominal swelling and vomiting brown/black emesis hx of bowel obstruction in past and has not moved bowels in 3 days.

## 2017-11-12 NOTE — ED Provider Notes (Signed)
[facility] DEPT Provider Note  CSN: [id number] Arrival date & time: 11/12/17 1944  Chief Complaint(s) Abdominal Pain  HPI Collin [name] is a 77 y.o. male with extensive past medical history listed below including throat cancer status post radiation requiring PEG tube for nutrition, prior abdominal surgeries and prior small bowel obstruction who presents to the emergency department several days of intermittent abdominal pain that became constant yesterday.  Since yesterday patient has had increased nausea and vomiting.  He reports that he has been vomiting dark brown emesis.  Patient does take p.o. but reports decreased tolerance.  Patient denies any abdominal pain is exacerbated with emesis and palpation.  No alleviating factors.  No fevers or chills.  No runny nose cough congestion.  No chest pain or shortness of breath.  No diarrhea.  States that the last bowel movement he had was several days ago.  Denies any flatus.  Denies any other physical complaints at this time.  HPI  Past Medical History Past Medical History:  Diagnosis Date  . AICD (automatic cardioverter/defibrillator) present   . Atrial fibrillation ([place])   . Cancer (HCC)    tongue cancer  . 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 CHF (congestive heart failure) ([place])   . Claustrophobia   . Diabetes mellitus   . Dysrhythmia   . Heart disease   . History of echocardiogram    Echo 8/18:  EF 55-60, no RWMA, severe LAE, normal RVSF, mild RAE  . History of radiation therapy 12/07/16- 01/24/17   Base of Tongue 70 Gy in 35 fractions  . Hyperlipidemia   . OSA (obstructive sleep apnea)   . Shingles   . Umbilical hernia   . 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 It sometimes shows as a shadow on scans.    Patient Active Problem List   Diagnosis Date Noted  . AKI (acute kidney injury) ([place]) 11/12/2017  . Vein disorder   . Umbilical hernia    . Shingles   . History of radiation therapy   . History of echocardiogram   . Heart disease   . Dysrhythmia   . Claustrophobia   . CHF (congestive heart failure) ([place])   . Cancer ([place])   . Atrial fibrillation ([place])   . AICD (automatic cardioverter/defibrillator) present   . History of head and neck cancer 07/25/2017  . Long term (current) use of anticoagulants 06/22/2017  . 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 Thrush, oral 12/21/2016  . Weight loss, abnormal 12/21/2016  . 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 Umbilical hernia without obstruction and without gangrene   . Pressure injury of skin 11/14/2016  . Tongue mass: Per Ct neck 11/10/2016 11/12/2016  . SBO (small bowel obstruction) ([place]) 11/11/2016  . Encounter for therapeutic drug monitoring 10/25/2013  . Diabetic peripheral neuropathy ([place]) 10/14/2011  . Hypotension 08/20/2011  . Acute renal failure ([place]) 08/20/2011  . OSA (obstructive sleep apnea) 04/15/2011  . ICD-[employer] 03/30/2011  . Chronic diastolic CHF (congestive heart failure) ([place]) 03/30/2011  . PAF (paroxysmal atrial fibrillation) ([place]) 12/14/2010  . 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 Type 2 diabetes mellitus ([place]) 12/14/2010  . Hyperlipidemia 12/14/2010  . Cardiomyopathy, nonischemic ([place]) 09/06/1996   Home Medication(s) Prior to Admission medications   Medication Sig Start Date End Date Taking? Authorizing Provider  acetaminophen (TYLENOL) 500 MG tablet Take 1,000 mg by mouth  every 6 (six) hours as needed for moderate pain.   Yes [provider]  carvedilol (COREG) 25 MG tablet Take 25 mg by mouth twice daily 09/14/17  Yes Burchette, Alinda Sierras, MD  digoxin (LANOXIN) 0.125 MG tablet Take 1 tablet (0.125 mg total) by mouth daily. 09/13/17  Yes Fay Records, MD  diltiazem (CARDIZEM) 90 MG tablet Take 1 tablet (90 mg total) by mouth daily. 09/13/17  Yes Fay Records, MD  GuaiFENesin  (TUSSIN PO) Take 10 mLs at bedtime as needed by mouth (congestion).   Yes [provider]  Nutritional Supplements (FEEDING SUPPLEMENT, OSMOLITE 1.5 CAL,) LIQD Give 2 cans of formula at 8am and 12 noon. Give additional 1 1/2 cans at 4pm and 8pm feeding.   Flush with 36ml of water before and after feeding 4 times per day Patient taking differently: Place 355-574 mLs See admin instructions into feeding tube. Give 2 cans of formula at 8am and 12 noon. Give additional 1 1/2 cans at 4pm and 8pm feeding.   Flush with 12ml of water before and after feeding 4 times per day 12/28/16  Yes Eppie Gibson, MD  polyethylene glycol [facility]) packet Take 17 g by mouth daily as needed. Available OTC Patient taking differently: Take 17 g daily as needed by mouth for moderate constipation.  11/17/16  Yes Rai, Ripudeep K, MD  potassium chloride (K-DUR) 10 MEQ tablet Dissolve 1 tablet in water and administer via PEG tube daily Patient taking differently: Take 10 mEq See admin instructions by mouth. Dissolve 10 meq in water and administer via PEG tube daily 06/30/17  Yes Fay Records, MD  sodium fluoride (PREVIDENT 5000 PLUS) 1.1 % CREA dental cream Apply gel to tooth brush. Brush teeth for 2 minutes. Spit out excess-DO NOT swallow. Repeat nightly. Patient taking differently: Place 1 application daily onto teeth. Apply gel to tooth brush. Brush teeth for 2 minutes. Spit out excess-DO NOT swallow. Repeat nightly. 12/16/16  Yes Lenn Cal, DDS  torsemide (DEMADEX) 20 MG tablet Take 1 tablet (20 mg total) by mouth daily. 09/13/17  Yes Fay Records, MD  warfarin (COUMADIN) 5 MG tablet TAKE AS DIRECTED BY ANTICOAGULATION CLINIC. Patient taking differently: Take 5-7.5 mg by mouth See admin instructions. TAKE AS DIRECTED BY ANTICOAGULATION CLINIC. Take 5 mg daily except Wednesday and Saturday take 7.5 mg 09/15/17  Yes Burchette, Alinda Sierras, MD  Nutritional Supplements (PROMOD) LIQD Give 50ml (1 oz) 2 times per day via  tube. Flush with 32ml of water before and after given. Patient not taking: Reported on 11/12/2017 12/28/16   Eppie Gibson, MD  rosuvastatin (CRESTOR) 10 MG tablet Take 1 tablet (10 mg total) by mouth daily. 11/11/17 02/09/18  Fay Records, MD  Past Surgical History Past Surgical History:  Procedure Laterality Date  . CARDIAC DEFIBRILLATOR PLACEMENT  2006  . CATARACT EXTRACTION Bilateral    01/07/2016. 01/22/2016  . CYST EXCISION     multiple drainage to a cyst in his neck.   Marland Kitchen DIRECT LARYNGOSCOPY N/A 11/16/2016   Procedure: DIRECT LARYNGOSCOPY AND BIOPSY;  Surgeon: Rozetta Nunnery, MD;  Location: [facility];  Service: ENT;  Laterality: N/A;  . EYE SURGERY    . HERNIA REPAIR     1950's  . IMPLANTABLE CARDIOVERTER DEFIBRILLATOR (ICD) GENERATOR CHANGE N/A 05/10/2014   Procedure: ICD GENERATOR CHANGE;  Surgeon: [name], MD;  Location[facility] LAB;  Service: Cardiovascular;  Laterality: N/A;  . IR GASTROSTOMY TUBE MOD SED  12/27/2016  . IR PATIENT EVAL TECH 0-60 MINS  01/17/2017  . MASS EXCISION Right 11/16/2016   Procedure: EXCISION RIGHT NECK LIPOMA;  Surgeon: Rozetta Nunnery, MD;  Location: [facility];  Service: ENT;  Laterality: Right;  . MOHS SURGERY  01/01/2015   to top of head  . MOUTH SURGERY     teeth removed 4 teeth extracted 02/2010  . RADICAL NECK DISSECTION Right 07/25/2017   Procedure: RIGHT NECK DISSECTION;  Surgeon: Rozetta Nunnery, MD;  Location[facility] OR;  Service: ENT;  Laterality: Right;   Family History Family History  Problem Relation Age of Onset  . Heart disease Mother   . Sudden death Mother        under age 42  . Lupus Mother   . Hypertension Father   . Stroke Father     Social History Social History   Tobacco Use  . Smoking status: Never Smoker  . Smokeless tobacco: Never Used  Substance Use Topics  . Alcohol use: Yes     Comment: he has a past history of social drinking  . Drug use: No   Allergies Niaspan [niacin er]  Review of Systems Review of Systems All other systems are reviewed and are negative for acute change except as noted in the HPI  Physical Exam Vital Signs  I have reviewed the triage vital signs BP (!) 117/92 (BP Location: Right Arm)   Pulse (!) 109   Resp 18   Ht 6' (1.829 m)   Wt 103 kg (227 lb)   SpO2 99%   BMI 30.79 kg/m   Physical Exam  Constitutional: He is oriented to person, place, and time. He appears well-developed and well-nourished. No distress.  HENT:  Head: Normocephalic and atraumatic.  Nose: Nose normal.  Eyes: Conjunctivae and EOM are normal. Pupils are equal, round, and reactive to light. Right eye exhibits no discharge. Left eye exhibits no discharge. No scleral icterus.  Neck: Normal range of motion. Neck supple.  Cardiovascular: Normal rate and regular rhythm. Exam reveals no gallop and no friction rub.  No murmur heard. Pulmonary/Chest: Effort normal and breath sounds normal. No stridor. No respiratory distress. He has no rales.    Abdominal: Soft. He exhibits distension. There is generalized tenderness. There is rebound. There is no rigidity and no guarding.    Musculoskeletal: He exhibits no edema or tenderness.  2+ bilateral lower extremity edema (chronic per patient.)  Neurological: He is alert and oriented to person, place, and time.  Skin: Skin is warm and dry. No rash noted. He is not diaphoretic. No erythema.  Psychiatric: He has a normal mood and affect.  Vitals reviewed.   ED Results and Treatments Labs (all labs ordered are listed, but  only abnormal results are displayed) Labs Reviewed  COMPREHENSIVE METABOLIC PANEL - Abnormal; Notable for the following components:      Result Value   Chloride 96 (*)    Glucose, Bld 158 (*)    BUN 36 (*)    Total Protein 8.3 (*)    Alkaline Phosphatase 149 (*)    GFR calc non Af Amer 59 (*)     Anion gap 16 (*)    All other components within normal limits  URINALYSIS, ROUTINE W REFLEX MICROSCOPIC - Abnormal; Notable for the following components:   Color, Urine AMBER (*)    APPearance HAZY (*)    Bilirubin Urine SMALL (*)    Ketones, ur 5 (*)    Protein, ur 100 (*)    Squamous Epithelial / LPF 0-5 (*)    All other components within normal limits  PROTIME-INR - Abnormal; Notable for the following components:   Prothrombin Time 35.1 (*)    All other components within normal limits  I-STAT CG4 LACTIC ACID, ED - Abnormal; Notable for the following components:   Lactic Acid, Venous 2.16 (*)    All other components within normal limits  LIPASE, BLOOD  CBC  PROTIME-INR  MAGNESIUM  CBC  COMPREHENSIVE METABOLIC PANEL  LACTIC ACID, PLASMA                                                                                                                         EKG  EKG Interpretation  Date/Time:  Saturday November 12 2017 21:22:41 EST Ventricular Rate:  119 PR Interval:    QRS Duration: 89 QT Interval:  329 QTC Calculation: 447 R Axis:   -66 Text Interpretation:  Atrial fibrillation Inferior infarct, old Anterior infarct, old since last tracing no significant change Confirmed by Daleen Bo [phone]) on 11/12/2017 9:38:15 PM      Radiology Ct Abdomen Pelvis W Contrast  Result Date: 11/12/2017 CLINICAL DATA:  Vomiting black emesis for 3 days with swelling EXAM: CT ABDOMEN AND PELVIS WITH CONTRAST TECHNIQUE: Multidetector CT imaging of the abdomen and pelvis was performed using the standard protocol following bolus administration of intravenous contrast. CONTRAST:  119mL ISOVUE-300 IOPAMIDOL (ISOVUE-300) INJECTION 61% COMPARISON:  Head CT 05/17/2017, CT abdomen pelvis 11/09/2016 FINDINGS: Lower chest: Lung bases demonstrate mild ground-glass density at the left base. No pleural effusion. Cardiomegaly with partially visualized cardiac pacing leads. Coronary calcification. Bilateral  gynecomastia. Hepatobiliary: Subcentimeter hypodensity anterior dome of liver too small to characterize. Calcified stone in the gallbladder. No biliary dilatation. Pancreas: Unremarkable. No pancreatic ductal dilatation or surrounding inflammatory changes. Spleen: Normal in size without focal abnormality. Adrenals/Urinary Tract: Mild thickening of the adrenal glands without discrete mass. Cyst in the upper pole of the right kidney. No hydronephrosis. Thick-walled appearance of the urinary bladder. Stomach/Bowel: Dilated fluid-filled stomach. Multiple loops of dilated small bowel measuring up to 4.7 cm. Collapsed distal small bowel with transition point in the anterior abdomen near the level of umbilical hernia, series 2,  image number [id number]. No colon wall thickening. Normal appendix. Vascular/Lymphatic: Tortuous aorta with extensive calcification. No aneurysmal dilatation. 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 No significantly enlarged lymph nodes. Reproductive: Enlarged prostate gland with calcification Other: Negative for free air or free fluid. Periumbilical fat containing hernia with small amount of fluid in the hernia sac. Musculoskeletal: Degenerative changes of the spine. No acute or suspicious lesion IMPRESSION: 1. Dilated stomach and small bowel, consistent with mechanical small bowel obstruction, transition point visualized within the anterior abdomen at the level of the patient's umbilical hernia, findings likely due to adhesion. 2. Thick-walled appearance of the urinary bladder, query cystitis versus chronic obstruction. Prostate is enlarged. 3. Calcified gallstones 4. Cardiomegaly. 5. Gynecomastia 6. Azygos continuation of the IVC. Electronically Signed   By: Donavan Foil M.D.   On: 11/12/2017 22:16   Pertinent labs & imaging results that were available during my care of the patient were reviewed by me and considered in my medical decision making (see chart for  details).  Medications Ordered in ED Medications  sodium chloride 0.9 % injection (not administered)  sodium chloride flush (NS) 0.9 % injection 3 mL (not administered)  famotidine (PEPCID) IVPB 20 mg premix (not administered)  ondansetron (ZOFRAN) injection 4 mg (not administered)  fentaNYL (SUBLIMAZE) injection 25-50 mcg (not administered)  0.9 %  sodium chloride infusion (not administered)  diltiazem (CARDIZEM) injection 10 mg (not administered)  diltiazem (CARDIZEM) injection 20 mg (not administered)  hydrALAZINE (APRESOLINE) injection 10 mg (not administered)  acetaminophen (TYLENOL) tablet 650 mg (not administered)    Or  acetaminophen (TYLENOL) suppository 650 mg (not administered)  promethazine (PHENERGAN) tablet 12.5 mg (not administered)  ondansetron (ZOFRAN) injection 4 mg (4 mg Intravenous Given 11/12/17 2123)  fentaNYL (SUBLIMAZE) injection 50 mcg (50 mcg Intravenous Given 11/12/17 2124)  sodium chloride 0.9 % bolus 500 mL (0 mLs Intravenous Stopped 11/12/17 2250)  iopamidol (ISOVUE-300) 61 % injection (100 mLs  Contrast Given 11/12/17 2131)  fentaNYL (SUBLIMAZE) injection 50 mcg (50 mcg Intravenous Given 11/12/17 2250)  ondansetron (ZOFRAN) injection 4 mg (2 mg Intravenous Given 11/12/17 2249)  ondansetron (ZOFRAN) injection 4 mg (4 mg Intravenous Given 11/12/17 2249)  0.9 %  sodium chloride infusion ( Intravenous New Bag/Given 11/12/17 2251)                                                                                                                                    Procedures Procedures  (including critical care time)  Medical Decision Making / ED Course I have reviewed the nursing notes for this encounter and the patient's prior records (if available in EHR or on provided paperwork).    Workup consistent with small bowel obstruction.  Patient treated symptomatically with antiemetics and IV fluids.  Intermittent low suction attached to the patient's PEG tube.  Case discussed  with medicine for admission.  Final Clinical Impression(s) /  ED Diagnoses Final diagnoses:  SBO (small bowel obstruction) ([place])      This chart was dictated using voice recognition software.  Despite best efforts to proofread,  errors can occur which can change the documentation meaning.   Fatima Blank, MD 11/13/17 [phone]

## 2017-11-12 NOTE — H&P (Signed)
History and Physical    Collin [name] XBM:[id number] DOB: [birth year] DOA: 11/12/2017  PCP: Collin Covey, MD   Patient coming from: Home  Chief Complaint: Abd pain, distension, N/V   HPI: Collin [name] is a 77 y.o. 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  Patient reports that he had been experiencing increased swelling involving the bilateral lower extremities, admitting to recent dietary indiscretions.  He had otherwise been well until noting some mid abdominal discomfort with nausea approximately 3 days ago.  He also noted some mild distention at that time.  Over the ensuing days, discomfort and nausea has worsened, and he has had numerous episodes of vomiting for the past day.  Vomitus is greenish brown liquid.  He has not moved his bowels in 3 days or more.  He has an umbilical hernia that is soft, nontender, and without discoloration.  He denies any recent fevers or chills.  Denies chest pain or palpitations.  No cough or shortness of breath reported.  ED Course: Upon arrival to the ED, patient is found to be afebrile, saturating well on room air, and slightly hypertensive.  EKG features atrial fibrillation with rate 119.  Chemistry panel is notable for a BUN of 36 and creatinine of 1.16, up from 0.67 two days earlier.  CBC is unremarkable.  Lactic acid is slightly elevated to 2.16 and INR is elevated to 3.53.  CT of the abdomen and pelvis features dilated stomach and small bowel consistent with SBO, transition point in the anterior abdomen at the level of the umbilicus and likely secondary to adhesions.  Patient was treated with 500 cc normal saline, fentanyl, and multiple doses of Zofran in the ED.  NG tube was ordered by the ED physician but not  yet placed.  Patient remains hemodynamically stable with intermittent tachycardia and normal blood pressure.  There is no respiratory distress.  He will be admitted to the telemetry unit for ongoing evaluation and management of small bowel obstruction secondary to adhesions.  Review of Systems:  All other systems reviewed and apart from HPI, are negative.  Past Medical History:  Diagnosis Date  . AICD (automatic cardioverter/defibrillator) present   . Atrial fibrillation (HCC)   . Cancer (HCC)    tongue cancer  . Cardiomyopathy, nonischemic (HCC) 1998  . CHF (congestive heart failure) (HCC)   . Claustrophobia   . Diabetes mellitus   . Dysrhythmia   . Heart disease   . History of echocardiogram    Echo 8/18:  EF 55-60, no RWMA, severe LAE, normal RVSF, mild RAE  . History of radiation therapy 12/07/16- 01/24/17   Base of Tongue 70 Gy in 35 fractions  . Hyperlipidemia   . OSA (obstructive sleep apnea)   . Shingles   . Umbilical hernia   . 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 It sometimes shows as a shadow on scans.     Past Surgical History:  Procedure Laterality Date  . CARDIAC DEFIBRILLATOR PLACEMENT  2006  . CATARACT EXTRACTION Bilateral    01/07/2016. 01/22/2016  . CYST EXCISION     multiple drainage to a cyst in his neck.   Marland Kitchen DIRECT LARYNGOSCOPY N/A 11/16/2016   Procedure: DIRECT LARYNGOSCOPY AND BIOPSY;  Surgeon: [name], MD;  Location: [facility] OR;  Service: ENT;  Laterality: N/A;  . EYE SURGERY    . HERNIA REPAIR     1950's  . IMPLANTABLE CARDIOVERTER DEFIBRILLATOR (ICD) GENERATOR CHANGE N/A 05/10/2014   Procedure: ICD GENERATOR CHANGE;  Surgeon: [name], MD;  Location: [facility] CATH LAB;  Service: Cardiovascular;  Laterality: N/A;  . IR GASTROSTOMY TUBE MOD SED  12/27/2016  . IR PATIENT EVAL TECH 0-60 MINS  01/17/2017  . MASS EXCISION Right 11/16/2016   Procedure: EXCISION RIGHT NECK LIPOMA;  Surgeon:  [name], MD;  Location: [facility] OR;  Service: ENT;  Laterality: Right;  . MOHS SURGERY  01/01/2015   to top of head  . MOUTH SURGERY     teeth removed 4 teeth extracted 02/2010  . RADICAL NECK DISSECTION Right 07/25/2017   Procedure: RIGHT NECK DISSECTION;  Surgeon: [name], MD;  Location: [facility] OR;  Service: ENT;  Laterality: Right;     reports that  has never smoked. he has never used smokeless tobacco. He reports that he drinks alcohol. He reports that he does not use drugs.  Allergies  Allergen Reactions  . Niaspan [Niacin Er] Other (See Comments)    REACTION NOT AN ALLERGY >  FLUSHED, EXPECTED W/NIACIN    Family History  Problem Relation Age of Onset  . Heart disease Mother   . Sudden death Mother        under age 46  . Lupus Mother   . Hypertension Father   . Stroke Father      Prior to Admission medications   Medication Sig Start Date End Date Taking? Authorizing Provider  acetaminophen (TYLENOL) 500 MG tablet Take 1,000 mg by mouth every 6 (six) hours as needed for moderate pain.    [provider]  carvedilol (COREG) 25 MG tablet Take 25 mg by mouth twice daily 09/14/17   Burchette, Elberta Fortis, MD  digoxin (LANOXIN) 0.125 MG tablet Take 1 tablet (0.125 mg total) by mouth daily. 09/13/17   Pricilla Riffle, MD  diltiazem (CARDIZEM) 90 MG tablet Take 1 tablet (90 mg total) by mouth daily. 09/13/17   Pricilla Riffle, MD  GuaiFENesin (TUSSIN PO) Take 10 mLs at bedtime as needed by mouth (congestion).    [provider]  Nutritional Supplements (FEEDING SUPPLEMENT, OSMOLITE 1.5 CAL,) LIQD Give 2 cans of formula at 8am and 12 noon. Give additional 1 1/2 cans at 4pm and 8pm feeding.   Flush with 60ml of water before and after feeding 4 times per day Patient taking differently: Place 355-574 mLs See admin instructions into feeding tube. Give 2 cans of formula at 8am and 12 noon. Give additional 1 1/2 cans at 4pm and 8pm feeding.   Flush with 60ml of water  before and after feeding 4 times per day 12/28/16   Lonie Peak, MD  Nutritional Supplements (PROMOD) LIQD Give 30ml (1 oz) 2 times per day via tube. Flush with 60ml of water before and after given. Patient taking differently: Place 30 mLs See admin instructions into feeding tube. Give 30ml (1 oz) once per day via tube. Flush with 60ml of water before and after given. 12/28/16   Lonie Peak, MD  polyethylene glycol [facility]) packet Take 17 g by mouth daily as needed. Available OTC Patient taking differently: Take 17 g daily as needed by mouth for moderate constipation.  11/17/16   Rai, Delene Ruffini, MD  potassium chloride (K-DUR) 10 MEQ tablet  Dissolve 1 tablet in water and administer via PEG tube daily Patient taking differently: Take 10 mEq See admin instructions by mouth. Dissolve 10 meq in water and administer via PEG tube daily 06/30/17   Pricilla Riffle, MD  rosuvastatin (CRESTOR) 10 MG tablet Take 1 tablet (10 mg total) by mouth daily. 11/11/17 02/09/18  Pricilla Riffle, MD  sodium fluoride (PREVIDENT 5000 PLUS) 1.1 % CREA dental cream Apply gel to tooth brush. Brush teeth for 2 minutes. Spit out excess-DO NOT swallow. Repeat nightly. Patient taking differently: Place 1 application daily onto teeth. Apply gel to tooth brush. Brush teeth for 2 minutes. Spit out excess-DO NOT swallow. Repeat nightly. 12/16/16   Charlynne Pander, DDS  torsemide (DEMADEX) 20 MG tablet Take 1 tablet (20 mg total) by mouth daily. 09/13/17   Pricilla Riffle, MD  warfarin (COUMADIN) 5 MG tablet TAKE AS DIRECTED BY ANTICOAGULATION CLINIC. 09/15/17   Collin Covey, MD    Physical Exam: Vitals:   11/12/17 1951 11/12/17 1956 11/12/17 2213  BP: (!) 117/92  (!) 132/94  Pulse: (!) 109  95  Resp: 18  14  Temp:   97.8 F (36.6 C)  TempSrc:   Oral  SpO2: 99%  94%  Weight:  103 kg (227 lb)   Height:  6' (1.829 m)       Constitutional: NAD, calm, appears uncomfortable Eyes: PERTLA, lids and conjunctivae normal ENMT:  Mucous membranes are moist. Posterior pharynx clear of any exudate or lesions.   Neck: normal, supple, no masses, no thyromegaly Respiratory: clear to auscultation bilaterally, no wheezing, no crackles. Normal respiratory effort.   Cardiovascular: Rate ~110 and irregular. Pretibial pitting edema bilaterally. Abdomen:Mild distension, generalized tenderness without rebound pain or guarding. Rare high-pitched bowel sound appreciated.  Musculoskeletal: no clubbing / cyanosis. No joint deformity upper and lower extremities.   Skin: no significant rashes, lesions, ulcers. Warm, dry, well-perfused. Neurologic: CN 2-12 grossly intact. Sensation intact. Strength 5/5 in all 4 limbs.  Psychiatric:  Alert and oriented x 3. Very pleasant, cooperative.     Labs on Admission: I have personally reviewed following labs and imaging studies  CBC: Recent Labs  Lab 11/10/17 1201 11/12/17 2005  WBC 3.2* 4.1  HGB 13.8 16.1  HCT 41.5 47.5  MCV 94 93.5  PLT 171 235   Basic Metabolic Panel: Recent Labs  Lab 11/10/17 1201 11/12/17 2005  NA 143 142  K 4.3 4.0  CL 103 96*  CO2 25 30  GLUCOSE 94 158*  BUN 20 36*  CREATININE 0.67* 1.16  CALCIUM 9.4 9.9   GFR: Estimated Creatinine Clearance: 67.3 mL/min (by C-G formula based on SCr of 1.16 mg/dL). Liver Function Tests: Recent Labs  Lab 11/12/17 2005  AST 34  ALT 57  ALKPHOS 149*  BILITOT 0.8  PROT 8.3*  ALBUMIN 4.2   Recent Labs  Lab 11/12/17 2005  LIPASE 27   No results for input(s): AMMONIA in the last 168 hours. Coagulation Profile: Recent Labs  Lab 11/12/17 2107  INR 3.53   Cardiac Enzymes: No results for input(s): CKTOTAL, CKMB, CKMBINDEX, TROPONINI in the last 168 hours. BNP (last 3 results) Recent Labs    04/25/17 1606 06/15/17 1136 11/10/17 1201  PROBNP 676* 1,015* 1,116*   HbA1C: No results for input(s): HGBA1C in the last 72 hours. CBG: No results for input(s): GLUCAP in the last 168 hours. Lipid  Profile: Recent Labs    11/10/17 1201  CHOL 175  HDL 65  LDLCALC 89  TRIG 105  CHOLHDL 2.7   Thyroid Function Tests: No results for input(s): TSH, T4TOTAL, FREET4, T3FREE, THYROIDAB in the last 72 hours. Anemia Panel: No results for input(s): VITAMINB12, FOLATE, FERRITIN, TIBC, IRON, RETICCTPCT in the last 72 hours. Urine analysis:    Component Value Date/Time   COLORURINE AMBER (A) 11/12/2017 2006   APPEARANCEUR HAZY (A) 11/12/2017 2006   LABSPEC 1.024 11/12/2017 2006   PHURINE 5.0 11/12/2017 2006   GLUCOSEU NEGATIVE 11/12/2017 2006   HGBUR NEGATIVE 11/12/2017 2006   BILIRUBINUR SMALL (A) 11/12/2017 2006   KETONESUR 5 (A) 11/12/2017 2006   PROTEINUR 100 (A) 11/12/2017 2006   UROBILINOGEN 0.2 08/20/2011 1711   NITRITE NEGATIVE 11/12/2017 2006   LEUKOCYTESUR NEGATIVE 11/12/2017 2006   Sepsis Labs: @LABRCNTIP (procalcitonin:4,lacticidven:4) )No results found for this or any previous visit (from the past 240 hour(s)).   Radiological Exams on Admission: Ct Abdomen Pelvis W Contrast  Result Date: 11/12/2017 CLINICAL DATA:  Vomiting black emesis for 3 days with swelling EXAM: CT ABDOMEN AND PELVIS WITH CONTRAST TECHNIQUE: Multidetector CT imaging of the abdomen and pelvis was performed using the standard protocol following bolus administration of intravenous contrast. CONTRAST:  ISOVUE-300 IOPAMIDOL (ISOVUE-300) INJECTION 61% COMPARISON:  Head CT 05/17/2017, CT abdomen pelvis 11/09/2016 FINDINGS: Lower chest: Lung bases demonstrate mild ground-glass density at the left base. No pleural effusion. Cardiomegaly with partially visualized cardiac pacing leads. Coronary calcification. Bilateral gynecomastia. Hepatobiliary: Subcentimeter hypodensity anterior dome of liver too small to characterize. Calcified stone in the gallbladder. No biliary dilatation. Pancreas: Unremarkable. No pancreatic ductal dilatation or surrounding inflammatory changes. Spleen: Normal in size without focal  abnormality. Adrenals/Urinary Tract: Mild thickening of the adrenal glands without discrete mass. Cyst in the upper pole of the right kidney. No hydronephrosis. Thick-walled appearance of the urinary bladder. Stomach/Bowel: Dilated fluid-filled stomach. Multiple loops of dilated small bowel measuring up to 4.7 cm. Collapsed distal small bowel with transition point in the anterior abdomen near the level of umbilical hernia, series 2, image number 66. No colon wall thickening. Normal appendix. Vascular/Lymphatic: Tortuous aorta with extensive calcification. No aneurysmal dilatation. 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 No significantly enlarged lymph nodes. Reproductive: Enlarged prostate gland with calcification Other: Negative for free air or free fluid. Periumbilical fat containing hernia with small amount of fluid in the hernia sac. Musculoskeletal: Degenerative changes of the spine. No acute or suspicious lesion IMPRESSION: 1. Dilated stomach and small bowel, consistent with mechanical small bowel obstruction, transition point visualized within the anterior abdomen at the level of the patient's umbilical hernia, findings likely due to adhesion. 2. Thick-walled appearance of the urinary bladder, query cystitis versus chronic obstruction. Prostate is enlarged. 3. Calcified gallstones 4. Cardiomegaly. 5. Gynecomastia 6. Azygos continuation of the IVC. Electronically Signed   By: Jasmine Pang M.D.   On: 11/12/2017 22:16    EKG: Independently reviewed. Atrial fibrillation, rate 119.   Assessment/Plan  1. SBO  - Presents with 3 days of abdominal pain, mild distension, and nausea with vomiting  - CT findings consistent with SBO due to adhesions  - Treated in ED with IVF, IV analgesia, IV antiemetics  - Continue bowel-rest, decompression with NGT, prn analgesia and antiemetics, and serial exams   2. Acute kidney injury  - SCr is 1.16, up from 0.67 two days earlier  -  Likely prerenal in setting of SBO with N/V  - Treated with 500 cc NS in ED  - Continue a  gentle IVF hydration, mindful of CHF - Renally-dose medications, avoid nephrotoxins, repeat chem panel in am    3. Atrial fibrillation  - In atrial fibrillation on arrival with rates 90-120  - CHADS-VASc is at least 23 (age x2, CHF)  - He is managed at home with diltiazem, digoxin, and warfarin  - INR is 3.53 on admission  - Plan to continue cardiac monitoring use IV diltiazem, follow daily INR and consider heparin infusion as needed   4. Chronic diastolic CHF  - Has increased leg swelling recently, but also N/V with AKI; no respiratory distress or rales   - Echo from August '18 with preserved EF (had been 28% remotely and AICD is in place), no WMA's  - Managed at home with torsemide 20 mg qD, Coreg  - Treated in ED with 500 cc NS  - Hold medications in light of SBO, continue a gentle IVF hydration, follow daily wt and I/O's, repeat chem panel in am    5. Tongue cancer  - Status-post resection and radiation  - Has PEG tube, but recently started eating again and considering removal  - Appears to be stable, continue outpatient follow-up    DVT prophylaxis: warfarin  Code Status: Full  Family Communication: Wife updated at bedside Consults called: None Admission status: Inpatient    Briscoe Deutscher, MD [facility] Pager [phone]  If 7PM-7AM, please contact night-coverage [email or web address] Password [facility]  11/12/2017, 11:23 PM

## 2017-11-12 NOTE — ED Notes (Signed)
Notified EDP,Cardama,MD. Pt. I-stat CG4 Lactic acid results 2.16 and RN,Jon made aware.

## 2017-11-13 ENCOUNTER — Other Ambulatory Visit: Payer: Self-pay

## 2017-11-13 LAB — CBC
HCT: 46.4 % (ref 39.0–52.0)
Hemoglobin: 15.5 g/dL (ref 13.0–17.0)
MCH: 31.6 pg (ref 26.0–34.0)
MCHC: 33.4 g/dL (ref 30.0–36.0)
MCV: 94.5 fL (ref 78.0–100.0)
Platelets: 210 10*3/uL (ref 150–400)
RBC: 4.91 MIL/uL (ref 4.22–5.81)
RDW: 14.6 % (ref 11.5–15.5)
WBC: 3.6 10*3/uL — ABNORMAL LOW (ref 4.0–10.5)

## 2017-11-13 LAB — COMPREHENSIVE METABOLIC PANEL
ALT: 51 U/L (ref 17–63)
AST: 28 U/L (ref 15–41)
Albumin: 4.2 g/dL (ref 3.5–5.0)
Alkaline Phosphatase: 139 U/L — ABNORMAL HIGH (ref 38–126)
Anion gap: 14 (ref 5–15)
BUN: 37 mg/dL — ABNORMAL HIGH (ref 6–20)
CO2: 35 mmol/L — ABNORMAL HIGH (ref 22–32)
Calcium: 9.5 mg/dL (ref 8.9–10.3)
Chloride: 97 mmol/L — ABNORMAL LOW (ref 101–111)
Creatinine, Ser: 1.12 mg/dL (ref 0.61–1.24)
GFR calc Af Amer: >60 mL/min (ref >60)
GFR calc non Af Amer: >60 mL/min (ref >60)
Glucose, Bld: 155 mg/dL — ABNORMAL HIGH (ref 65–99)
Potassium: 3.4 mmol/L — ABNORMAL LOW (ref 3.5–5.1)
Sodium: 146 mmol/L — ABNORMAL HIGH (ref 135–145)
Total Bilirubin: 0.8 mg/dL (ref 0.3–1.2)
Total Protein: 7.7 g/dL (ref 6.5–8.1)

## 2017-11-13 LAB — GLUCOSE, CAPILLARY: Glucose-Capillary: 138 mg/dL — ABNORMAL HIGH (ref 65–99)

## 2017-11-13 LAB — PROTIME-INR
INR: 3.98
Prothrombin Time: 38.5 seconds — ABNORMAL HIGH (ref 11.4–15.2)

## 2017-11-13 LAB — MAGNESIUM: Magnesium: 1.8 mg/dL (ref 1.7–2.4)

## 2017-11-13 LAB — LACTIC ACID, PLASMA: Lactic Acid, Venous: 1.4 mmol/L (ref 0.5–1.9)

## 2017-11-13 MED ORDER — DEXTROSE-NACL 5-0.9 % IV SOLN
INTRAVENOUS | Status: DC
Start: 2017-11-13 — End: 2017-11-17
  Administered 2017-11-13 – 2017-11-16 (×6): via INTRAVENOUS

## 2017-11-13 MED ORDER — PROMETHAZINE HCL 25 MG/ML IJ SOLN: 12.5000 mg | Freq: Four times a day (QID) | INTRAMUSCULAR | Status: DC | PRN

## 2017-11-13 MED ORDER — POTASSIUM CHLORIDE 10 MEQ/100ML IV SOLN
10.0000 meq | INTRAVENOUS | Status: AC
  Administered 2017-11-13 (×3): 10 meq via INTRAVENOUS
  Filled 2017-11-13 (×3): qty 100

## 2017-11-13 NOTE — Progress Notes (Addendum)
PROGRESS NOTE    Collin [name]  XBJ:[id number] DOB: 08/11/1941 DOA: 11/12/2017 PCP: [name], MD   Brief Narrative: Patient is a 77 year old male with past medical history of  A. 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  Patient reported not having a bowel movement for more than 3 days. Patient was found to have small bowel obstruction.  History of small bowel obstruction in the past.  Started on conservative management with NG tube, n.p.o. status , IV fluids and pain medications.  Assessment & Plan:   Principal Problem:   SBO (small bowel obstruction) (HCC) Active Problems:   PAF (paroxysmal atrial fibrillation) (HCC)   Chronic diastolic CHF (congestive heart failure) (HCC)   Malignant neoplasm of base of tongue (HCC)   AKI (acute kidney injury) (HCC)   Small bowel obstruction: Continue conservative management.  General surgery following.  Abdomen pain is better.  He has bowel sounds.  Does not have a bowel movement yet.  Surgery following. He has history of small bowel obstruction in the past.. CT findings were consistent with SBO due to adhesions. Continue gentle IV fluids.  Continue analgesics and antiemetics.  NG tube was  attached to the G-tube.  Surgery planning for abdominal x-ray tomorrow morning.  Atrial fibrillation: Currently heart rate is controlled.  Was on warfarin at home.  INR supratherapeutic.  Will hold warfarin.  Continue daily monitoring of INR.   Chronic diastolic CHF: On torsemide at home.  Currently it is on hold and patient is on gentle IV fluid.  We will be cautious with IV fluids.  Tongue cancer: Status post resection and radiation.Status post G-tube placement but patient had started eating again and  considering removal.  Acute kidney injury: Kidney function stable.  Continue gentle IV fluids.  Avoid nephrotoxins  Hypokalemia: Supplemented.  Will check levels tomorrow.   DVT prophylaxis: Supratherapeutic INR Code Status: Full Family Communication: None present at the bedside Disposition Plan: Home after resolution of small bowel obstruction   Consultants: General surgery  Procedures: None  Antimicrobials: None  Subjective: Patient seen and examined the bedside this morning.  He is not in any acute distress.  Nausea , vomiting and abdominal pain have improved.  Objective: Vitals:   11/13/17 0017 11/13/17 0057 11/13/17 0438 11/13/17 1252  BP: (!) 130/100 (!) 109/93 115/75 103/71  Pulse: (!) 115  (!) 119 (!) 112  Resp: 15 16 18 16   Temp:  98.3 F (36.8 C) 99.1 F (37.3 C) 98.1 F (36.7 C)  TempSrc:  Oral Oral Oral  SpO2: 98% 97% 99% 94%  Weight:  97 kg (213 lb 12.8 oz) 97 kg (213 lb 12.8 oz)   Height:  6' (1.829 m)      Intake/Output Summary (Last 24 hours) at 11/13/2017 1311 Last data filed at 11/13/2017 1306 Gross per 24 hour  Intake 680.33 ml  Output 650 ml  Net 30.33 ml   Filed Weights   11/12/17 1956 11/13/17 0057 11/13/17 0438  Weight: 103 kg (227 lb) 97 kg (213 lb 12.8 oz) 97 kg (213 lb 12.8 oz)    Examination:  General exam: Appears calm and comfortable ,Not in distress,average built HEENT:PERRL,Oral mucosa moist, Ear/Nose normal on gross exam Respiratory system: Bilateral equal air entry, normal vesicular breath sounds, no wheezes or crackles  Cardiovascular system: S1 & S2 heard, RRR. No JVD, murmurs, rubs, gallops or clicks. No pedal edema. Gastrointestinal system: Abdomen is nondistended, mild generalized tenderness, soft and nontender. No organomegaly or masses felt. Normal bowel sounds heard.G tube Central nervous system: Alert and oriented. No focal neurological deficits. Extremities: No edema, no clubbing ,no cyanosis, distal peripheral pulses  palpable. Skin: No rashes, lesions or ulcers,no icterus ,no pallor MSK: Normal muscle bulk,tone ,power Psychiatry: Judgement and insight appear normal. Mood & affect appropriate.     Data Reviewed: I have personally reviewed following labs and imaging studies  CBC: Recent Labs  Lab 11/10/17 1201 11/12/17 2005 11/13/17 0134  WBC 3.2* 4.1 3.6*  HGB 13.8 16.1 15.5  HCT 41.5 47.5 46.4  MCV 94 93.5 94.5  PLT 171 235 210   Basic Metabolic Panel: Recent Labs  Lab 11/10/17 1201 11/12/17 2005 11/13/17 0134  NA 143 142 146*  K 4.3 4.0 3.4*  CL 103 96* 97*  CO2 25 30 35*  GLUCOSE 94 158* 155*  BUN 20 36* 37*  CREATININE 0.67* 1.16 1.12  CALCIUM 9.4 9.9 9.5  MG  --   --  1.8   GFR: Estimated Creatinine Clearance: 67.8 mL/min (by C-G formula based on SCr of 1.12 mg/dL). Liver Function Tests: Recent Labs  Lab 11/12/17 2005 11/13/17 0134  AST 34 28  ALT 57 51  ALKPHOS 149* 139*  BILITOT 0.8 0.8  PROT 8.3* 7.7  ALBUMIN 4.2 4.2   Recent Labs  Lab 11/12/17 2005  LIPASE 27   No results for input(s): AMMONIA in the last 168 hours. Coagulation Profile: Recent Labs  Lab 11/12/17 2107 11/13/17 0134  INR 3.53 3.98   Cardiac Enzymes: No results for input(s): CKTOTAL, CKMB, CKMBINDEX, TROPONINI in the last 168 hours. BNP (last 3 results) Recent Labs    04/25/17 1606 06/15/17 1136 11/10/17 1201  PROBNP 676* 1,015* 1,116*   HbA1C: No results for input(s): HGBA1C in the last 72 hours. CBG: Recent Labs  Lab 11/13/17 0759  GLUCAP 138*   Lipid Profile: No results for input(s): CHOL, HDL, LDLCALC, TRIG, CHOLHDL, LDLDIRECT in the last 72 hours. Thyroid Function Tests: No results for input(s): TSH, T4TOTAL, FREET4, T3FREE, THYROIDAB in the last 72 hours. Anemia Panel: No results for input(s): VITAMINB12, FOLATE, FERRITIN, TIBC, IRON, RETICCTPCT in the last 72 hours. Sepsis Labs: Recent Labs  Lab 11/12/17 2114 11/13/17 0134  LATICACIDVEN 2.16* 1.4    No  results found for this or any previous visit (from the past 240 hour(s)).       Radiology Studies: Ct Abdomen Pelvis W Contrast  Result Date: 11/12/2017 CLINICAL DATA:  Vomiting black emesis for 3 days with swelling EXAM: CT ABDOMEN AND PELVIS WITH CONTRAST TECHNIQUE: Multidetector CT imaging of the abdomen and pelvis was performed using the standard protocol following bolus administration of intravenous contrast. CONTRAST:  ISOVUE-300 IOPAMIDOL (ISOVUE-300) INJECTION 61% COMPARISON:  Head CT 05/17/2017, CT abdomen pelvis 11/09/2016 FINDINGS: Lower chest: Lung bases demonstrate mild ground-glass density at the left base. No pleural effusion. Cardiomegaly with partially visualized cardiac pacing leads. Coronary calcification. Bilateral gynecomastia. Hepatobiliary: Subcentimeter hypodensity anterior dome of liver too small to characterize. Calcified stone in the gallbladder. No biliary dilatation. Pancreas: Unremarkable. No pancreatic ductal dilatation or surrounding inflammatory changes. Spleen: Normal in size without focal abnormality. Adrenals/Urinary Tract: Mild thickening of the adrenal glands without discrete mass. Cyst in the upper pole of the right kidney. No hydronephrosis. Thick-walled appearance of the urinary bladder. Stomach/Bowel: Dilated  fluid-filled stomach. Multiple loops of dilated small bowel measuring up to 4.7 cm. Collapsed distal small bowel with transition point in the anterior abdomen near the level of umbilical hernia, series 2, image number 66. No colon wall thickening. Normal appendix. Vascular/Lymphatic: Tortuous aorta with extensive calcification. No aneurysmal dilatation. 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 No significantly enlarged lymph nodes. Reproductive: Enlarged prostate gland with calcification Other: Negative for free air or free fluid. Periumbilical fat containing hernia with small amount of fluid in the hernia sac.  Musculoskeletal: Degenerative changes of the spine. No acute or suspicious lesion IMPRESSION: 1. Dilated stomach and small bowel, consistent with mechanical small bowel obstruction, transition point visualized within the anterior abdomen at the level of the patient's umbilical hernia, findings likely due to adhesion. 2. Thick-walled appearance of the urinary bladder, query cystitis versus chronic obstruction. Prostate is enlarged. 3. Calcified gallstones 4. Cardiomegaly. 5. Gynecomastia 6. Azygos continuation of the IVC. Electronically Signed   By: Jasmine Pang M.D.   On: 11/12/2017 22:16        Scheduled Meds: . diltiazem  10 mg Intravenous Q4H  . sodium chloride flush  3 mL Intravenous Q12H   Continuous Infusions: . dextrose 5 % and 0.9% NaCl 75 mL/hr at 11/13/17 0955  . famotidine (PEPCID) IV Stopped (11/13/17 1048)  . potassium chloride 10 mEq (11/13/17 1306)     LOS: 1 day    Time spent: More than 50% of that time was spent in counseling and/or coordination of care.      Meredith Leeds, MD [facility] Pager [phone]  If 7PM-7AM, please contact night-coverage [email or web address] Password [email or web address] 11/13/2017, 1:11 PM

## 2017-11-13 NOTE — Consult Note (Addendum)
Consulting MD: Marene Lenz, MD  CC: nausea, vomiting  HPI: 77 y.o. M with multiple medical problems on coumadin anticoagulation who presents to the ED after 2-3 days of nausea, vomiting and abdominal distention. 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  He has not had a BM in 3 days or more.  He denies any recent fevers or chills.  Denies chest pain or palpitations.  No cough or shortness of breath reported.  He has had bilateral inguinal hernia repairs and G tube placement by IR.  He doesn't remember if he's had any other surgeries in the past.    Past Medical History:  Diagnosis Date  . AICD (automatic cardioverter/defibrillator) present   . Atrial fibrillation ([place])   . Cancer (HCC)    tongue cancer  . 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 CHF (congestive heart failure) ([place])   . Claustrophobia   . Diabetes mellitus   . Dysrhythmia   . Heart disease   . History of echocardiogram    Echo 8/18:  EF 55-60, no RWMA, severe LAE, normal RVSF, mild RAE  . History of radiation therapy 12/07/16- 01/24/17   Base of Tongue 70 Gy in 35 fractions  . Hyperlipidemia   . OSA (obstructive sleep apnea)   . Shingles   . Umbilical hernia   . 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 It sometimes shows as a shadow on scans.    Past Surgical History:  Procedure Laterality Date  . CARDIAC DEFIBRILLATOR PLACEMENT  2006  . CATARACT EXTRACTION Bilateral    01/07/2016. 01/22/2016  . CYST EXCISION     multiple drainage to a cyst in his neck.   Marland Kitchen DIRECT LARYNGOSCOPY N/A 11/16/2016   Procedure: DIRECT LARYNGOSCOPY AND BIOPSY;  Surgeon: Rozetta Nunnery, MD;  Location: [facility];  Service: ENT;  Laterality: N/A;  . EYE SURGERY    . HERNIA REPAIR     1950's  . IMPLANTABLE CARDIOVERTER DEFIBRILLATOR (ICD) GENERATOR CHANGE N/A 05/10/2014    Procedure: ICD GENERATOR CHANGE;  Surgeon: [name], MD;  Location[facility] C[facility] LAB;  Service: Cardiovascular;  Laterality: N/A;  . IR GASTROSTOMY TUBE MOD SED  12/27/2016  . IR PATIENT EVAL TECH 0-60 MINS  01/17/2017  . MASS EXCISION Right 11/16/2016   Procedure: EXCISION RIGHT NECK LIPOMA;  Surgeon: Rozetta Nunnery, MD;  Location: [facility];  Service: ENT;  Laterality: Right;  . MOHS SURGERY  01/01/2015   to top of head  . MOUTH SURGERY     teeth removed 4 teeth extracted 02/2010  . RADICAL NECK DISSECTION Right 07/25/2017   Procedure: RIGHT NECK DISSECTION;  Surgeon: Rozetta Nunnery, MD;  Location: [facility] OR;  Service: ENT;  Laterality: Right;   Family History  Problem Relation Age of Onset  . Heart disease Mother   . Sudden death Mother        under age 8  . Lupus Mother   . Hypertension Father   . Stroke Father    Social History   Socioeconomic History  . Marital status: Married    Spouse name: [name]  . Number of children: 3  . Years of education: Not on file  . Highest education level: Not on file  Social Needs  . Financial resource strain: Not on file  . Food insecurity - worry: Not on  file  . Food insecurity - inability: Not on file  . Transportation needs - medical: Not on file  . Transportation needs - non-medical: Not on file  Occupational History  . Occupation: Retired Animal nutritionist  Tobacco Use  . Smoking status: Never Smoker  . Smokeless tobacco: Never Used  Substance and Sexual Activity  . Alcohol use: Yes    Comment: he has a past history of social drinking  . Drug use: No  . Sexual activity: Not on file  Other Topics Concern  . Not on file  Social History Narrative  . Not on file   Allergies  Allergen Reactions  . Niaspan [Niacin Er] Other (See Comments)    REACTION NOT AN ALLERGY >  FLUSHED, EXPECTED W/NIACIN   Review of Systems - General ROS: negative for - chills or fever Respiratory ROS: no cough, shortness of breath, or  wheezing Cardiovascular ROS: no chest pain or dyspnea on exertion Gastrointestinal ROS: positive for - abdominal pain, appetite loss, gas/bloating and nausea/vomiting negative for - diarrhea Genito-Urinary ROS: no dysuria, trouble voiding, or hematuria   BP 115/75 (BP Location: Right Arm)   Pulse (!) 119   Temp 99.1 F (37.3 C) (Oral)   Resp 18   Ht 6' (1.829 m)   Wt 213 lb 12.8 oz (97 kg)   SpO2 99%   BMI 29.00 kg/m    Physical Exam  Constitutional: He is oriented to person, place, and time. He appears well-developed and well-nourished.  HENT:  Head: Normocephalic and atraumatic.  Eyes: EOM are normal. Pupils are equal, round, and reactive to light.  Cardiovascular: Normal rate.  Pulmonary/Chest: Effort normal. He exhibits no tenderness.  Abdominal: Soft. Normal appearance. He exhibits distension. There is no tenderness. A hernia is present. Hernia confirmed positive in the ventral area.  Neurological: He is alert and oriented to person, place, and time.  Skin: Skin is warm and dry.    Assessment:  Appears to be an acute on chronic SBO.  NG out currently.  Recommend replacing NG and securing to nasal bridge.  Place to [facility] and repeat AXR in AM.  Hold Coumadin

## 2017-11-14 ENCOUNTER — Inpatient Hospital Stay (HOSPITAL_COMMUNITY): Payer: Medicare HMO

## 2017-11-14 DIAGNOSIS — R791 Abnormal coagulation profile: Secondary | ICD-10-CM

## 2017-11-14 LAB — PROTIME-INR
INR: 5.82
Prothrombin Time: 51.8 seconds — ABNORMAL HIGH (ref 11.4–15.2)

## 2017-11-14 LAB — GLUCOSE, CAPILLARY: Glucose-Capillary: 145 mg/dL — ABNORMAL HIGH (ref 65–99)

## 2017-11-14 LAB — BASIC METABOLIC PANEL
Anion gap: 9 (ref 5–15)
BUN: 30 mg/dL — ABNORMAL HIGH (ref 6–20)
CO2: 30 mmol/L (ref 22–32)
Calcium: 8.4 mg/dL — ABNORMAL LOW (ref 8.9–10.3)
Chloride: 103 mmol/L (ref 101–111)
Creatinine, Ser: 0.9 mg/dL (ref 0.61–1.24)
GFR calc Af Amer: >60 mL/min (ref >60)
GFR calc non Af Amer: >60 mL/min (ref >60)
Glucose, Bld: 152 mg/dL — ABNORMAL HIGH (ref 65–99)
Potassium: 3.5 mmol/L (ref 3.5–5.1)
Sodium: 142 mmol/L (ref 135–145)

## 2017-11-14 MED ORDER — PHYTONADIONE 5 MG PO TABS: 2.5000 mg | ORAL_TABLET | Freq: Once | ORAL | Status: DC

## 2017-11-14 MED ORDER — VITAMIN K1 10 MG/ML IJ SOLN
5.0000 mg | Freq: Once | INTRAVENOUS | Status: AC
  Administered 2017-11-14: 5 mg via INTRAVENOUS
  Filled 2017-11-14: qty 0.5

## 2017-11-14 MED ORDER — PHYTONADIONE 5 MG PO TABS
5.0000 mg | ORAL_TABLET | Freq: Once | ORAL | Status: DC
  Filled 2017-11-14: qty 1

## 2017-11-14 MED ORDER — PROMETHAZINE HCL 25 MG/ML IJ SOLN: 12.5000 mg | Freq: Four times a day (QID) | INTRAMUSCULAR | Status: DC | PRN

## 2017-11-14 MED ORDER — DIATRIZOATE MEGLUMINE & SODIUM 66-10 % PO SOLN
90.0000 mL | Freq: Once | ORAL | Status: AC
  Administered 2017-11-14: 90 mL via NASOGASTRIC
  Filled 2017-11-14: qty 90

## 2017-11-14 NOTE — Progress Notes (Signed)
CRITICAL VALUE ALERT  Critical Value:  INR 5.82  Date & Time Notied:  11/14/17 @ 0656  Provider Notified: X. Blount   Orders Received/Actions taken: No new orders received at this time.

## 2017-11-14 NOTE — Progress Notes (Signed)
Subjective/Chief Complaint: No complaints. Denies nausea   Objective: Vital signs in last 24 hours: Temp:  [98.1 F (36.7 C)-99.2 F (37.3 C)] 99.2 F (37.3 C) (03/11 0421) Pulse Rate:  [104-112] 104 (03/11 0421) Resp:  [16-18] 17 (03/11 0421) BP: (97-106)/(66-76) 97/66 (03/11 0421) SpO2:  [94 %-95 %] 95 % (03/11 0421) Weight:  [97 kg (213 lb 13.5 oz)] 97 kg (213 lb 13.5 oz) (03/11 0421) Last BM Date: 11/10/17  Intake/Output from previous day: 03/10 0701 - 03/11 0700 In: 1741.6 [I.V.:1341.6; IV Piggyback:400] Out: 690 [Urine:690] Intake/Output this shift: No intake/output data recorded.  General appearance: alert and cooperative Resp: clear to auscultation bilaterally Cardio: irregularly irregular rhythm GI: quite soft and nontender. easily reducible umbilical hernia. g tube to drain. mild distension  Lab Results:  Recent Labs    11/12/17 2005 11/13/17 0134  WBC 4.1 3.6*  HGB 16.1 15.5  HCT 47.5 46.4  PLT 235 210   BMET Recent Labs    11/13/17 0134 11/14/17 0501  NA 146* 142  K 3.4* 3.5  CL 97* 103  CO2 35* 30  GLUCOSE 155* 152*  BUN 37* 30*  CREATININE 1.12 0.90  CALCIUM 9.5 8.4*   PT/INR Recent Labs    11/13/17 0134 11/14/17 0501  LABPROT 38.5* 51.8*  INR 3.98 5.82*   ABG No results for input(s): PHART, HCO3 in the last 72 hours.  Invalid input(s): PCO2, PO2  Studies/Results: Ct Abdomen Pelvis W Contrast  Result Date: 11/12/2017 CLINICAL DATA:  Vomiting black emesis for 3 days with swelling EXAM: CT ABDOMEN AND PELVIS WITH CONTRAST TECHNIQUE: Multidetector CT imaging of the abdomen and pelvis was performed using the standard protocol following bolus administration of intravenous contrast. CONTRAST:  1102mL ISOVUE-300 IOPAMIDOL (ISOVUE-300) INJECTION 61% COMPARISON:  Head CT 05/17/2017, CT abdomen pelvis 11/09/2016 FINDINGS: Lower chest: Lung bases demonstrate mild ground-glass density at the left base. No pleural effusion. Cardiomegaly with  partially visualized cardiac pacing leads. Coronary calcification. Bilateral gynecomastia. Hepatobiliary: Subcentimeter hypodensity anterior dome of liver too small to characterize. Calcified stone in the gallbladder. No biliary dilatation. Pancreas: Unremarkable. No pancreatic ductal dilatation or surrounding inflammatory changes. Spleen: Normal in size without focal abnormality. Adrenals/Urinary Tract: Mild thickening of the adrenal glands without discrete mass. Cyst in the upper pole of the right kidney. No hydronephrosis. Thick-walled appearance of the urinary bladder. Stomach/Bowel: Dilated fluid-filled stomach. Multiple loops of dilated small bowel measuring up to 4.7 cm. Collapsed distal small bowel with transition point in the anterior abdomen near the level of umbilical hernia, series 2, image number 66. No colon wall thickening. Normal appendix. Vascular/Lymphatic: Tortuous aorta with extensive calcification. No aneurysmal dilatation. 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 No significantly enlarged lymph nodes. Reproductive: Enlarged prostate gland with calcification Other: Negative for free air or free fluid. Periumbilical fat containing hernia with small amount of fluid in the hernia sac. Musculoskeletal: Degenerative changes of the spine. No acute or suspicious lesion IMPRESSION: 1. Dilated stomach and small bowel, consistent with mechanical small bowel obstruction, transition point visualized within the anterior abdomen at the level of the patient's umbilical hernia, findings likely due to adhesion. 2. Thick-walled appearance of the urinary bladder, query cystitis versus chronic obstruction. Prostate is enlarged. 3. Calcified gallstones 4. Cardiomegaly. 5. Gynecomastia 6. Azygos continuation of the IVC. Electronically Signed   By: Donavan Foil M.D.   On: 11/12/2017 22:16   Dg Abd Portable 1v  Result Date: 11/14/2017 CLINICAL DATA:  Small  bowel obstruction. EXAM:  PORTABLE ABDOMEN - 1 VIEW COMPARISON:  Abdominal CT 11/12/2017 FINDINGS: Gas dilated loop of bowel in the left upper quadrant measuring up to 6 cm. Given adjacent transverse colonic segment containing stool this is likely progressively dilated small bowel. No interval evacuation of colon. Moderate gaseous distention of the stomach. Cholelithiasis. IMPRESSION: Gas dilated bowel in the left upper quadrant favoring small bowel over colon and compatible with ongoing partial small bowel obstruction. No interval evacuation of the colon. Electronically Signed   By: Monte Fantasia M.D.   On: 11/14/2017 08:59    Anti-infectives: Anti-infectives (From admission, onward)   None      Assessment/Plan: s/p * No surgery found * continue g tube to drain  Will start small bowel protocol through g tube Ambulate INR 5.8. Will need to be corrected Will follow  LOS: 2 days    TOTH III,Therasa Lorenzi S 11/14/2017

## 2017-11-14 NOTE — Progress Notes (Signed)
PROGRESS NOTE    Collin [name]  ZOX:[id number] DOB: 1940-12-10 DOA: 11/12/2017 PCP: [name], MD   Brief Narrative: Patient is a 77 year old male with past medical history of  A. 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  Patient reported not having a bowel movement for more than 3 days. Patient was found to have small bowel obstruction.  History of small bowel obstruction in the past.  Started on conservative management with drain, n.p.o. status , IV fluids and pain medications.  Assessment & Plan:   Principal Problem:   SBO (small bowel obstruction) (HCC) Active Problems:   PAF (paroxysmal atrial fibrillation) (HCC)   Chronic diastolic CHF (congestive heart failure) (HCC)   Malignant neoplasm of base of tongue (HCC)   AKI (acute kidney injury) (HCC)   Supratherapeutic INR   Small bowel obstruction: Continue conservative management.  General surgery following.  Abdomen pain is better.  He has bowel sounds.  Does not have a bowel movement yet. Passed flatus once. Surgery following. He has history of small bowel obstruction in the past.CT findings were consistent with SBO due to adhesions. Continue gentle IV fluids.  Continue analgesics and antiemetics.  Drain  was  attached to the G-tube.   Abdominal Xray showed: Gas dilated bowel in the left upper quadrant favoring small bowel over colon and compatible with ongoing partial small bowel obstruction.   Atrial fibrillation: Currently heart rate is controlled.  Was on warfarin at home.  INR supratherapeutic.  Will hold warfarin.  Continue daily monitoring of INR. Will be given 5 mg of Vitamin K IV today.  Chronic diastolic CHF: On torsemide at home.  Currently it is on hold and  patient is on gentle IV fluid.  We will be cautious with IV fluids.  Tongue cancer: Status post resection and radiation.Status post G-tube placement but patient had started eating again and considering removal.  Acute kidney injury: Kidney function stable.  Continue gentle IV fluids.  Avoid nephrotoxins  Hypokalemia: Supplemented and corrected.     DVT prophylaxis: Supratherapeutic INR Code Status: Full Family Communication: None present at the bedside Disposition Plan: Home after resolution of small bowel obstruction   Consultants: General surgery  Procedures: None  Antimicrobials: None  Subjective: Patient seen and examined the bedside this morning.  Abdominal pain is better.  He has not passed any stool.  Says he passed flatus once.  Objective: Vitals:   11/13/17 1252 11/13/17 2055 11/14/17 0421 11/14/17 1237  BP: 103/71 106/76 97/66 105/70  Pulse: (!) 112 (!) 105 (!) 104 90  Resp: 16 18 17 18   Temp: 98.1 F (36.7 C) 98.2 F (36.8 C) 99.2 F (37.3 C) 98.4 F (36.9 C)  TempSrc: Oral Oral Oral Oral  SpO2: 94% 94% 95% 93%  Weight:   97 kg (213 lb 13.5 oz)   Height:        Intake/Output Summary (Last 24 hours) at 11/14/2017 1335 Last data filed at 11/14/2017 1242 Gross per 24 hour  Intake 1241.25 ml  Output 740 ml  Net 501.25 ml   Filed Weights   11/13/17 0057 11/13/17 0438 11/14/17 0421  Weight: 97 kg (213 lb 12.8 oz) 97 kg (213 lb 12.8 oz) 97 kg (213 lb 13.5 oz)    Examination:  General exam: Appears calm and  comfortable ,Not in distress,average built HEENT:PERRL,Oral mucosa moist, Ear/Nose normal on gross exam Respiratory system: Bilateral equal air entry, normal vesicular breath sounds, no wheezes or crackles  Cardiovascular system: S1 & S2 heard, RRR. No JVD, murmurs, rubs, gallops or clicks. Gastrointestinal system: Abdomen is nondistended, soft and nontender. No organomegaly or masses felt. Normal bowel sounds heard. G tube attached to the  drain Central nervous system: Alert and oriented. No focal neurological deficits. Extremities: No edema, no clubbing ,no cyanosis, distal peripheral pulses palpable. Skin: No rashes, lesions or ulcers,no icterus ,no pallor MSK: Normal muscle bulk,tone ,power Psychiatry: Judgement and insight appear normal. Mood & affect appropriate.      Data Reviewed: I have personally reviewed following labs and imaging studies  CBC: Recent Labs  Lab 11/10/17 1201 11/12/17 2005 11/13/17 0134  WBC 3.2* 4.1 3.6*  HGB 13.8 16.1 15.5  HCT 41.5 47.5 46.4  MCV 94 93.5 94.5  PLT 171 235 210   Basic Metabolic Panel: Recent Labs  Lab 11/10/17 1201 11/12/17 2005 11/13/17 0134 11/14/17 0501  NA 143 142 146* 142  K 4.3 4.0 3.4* 3.5  CL 103 96* 97* 103  CO2 25 30 35* 30  GLUCOSE 94 158* 155* 152*  BUN 20 36* 37* 30*  CREATININE 0.67* 1.16 1.12 0.90  CALCIUM 9.4 9.9 9.5 8.4*  MG  --   --  1.8  --    GFR: Estimated Creatinine Clearance: 84.3 mL/min (by C-G formula based on SCr of 0.9 mg/dL). Liver Function Tests: Recent Labs  Lab 11/12/17 2005 11/13/17 0134  AST 34 28  ALT 57 51  ALKPHOS 149* 139*  BILITOT 0.8 0.8  PROT 8.3* 7.7  ALBUMIN 4.2 4.2   Recent Labs  Lab 11/12/17 2005  LIPASE 27   No results for input(s): AMMONIA in the last 168 hours. Coagulation Profile: Recent Labs  Lab 11/12/17 2107 11/13/17 0134 11/14/17 0501  INR 3.53 3.98 5.82*   Cardiac Enzymes: No results for input(s): CKTOTAL, CKMB, CKMBINDEX, TROPONINI in the last 168 hours. BNP (last 3 results) Recent Labs    04/25/17 1606 06/15/17 1136 11/10/17 1201  PROBNP 676* 1,015* 1,116*   HbA1C: No results for input(s): HGBA1C in the last 72 hours. CBG: Recent Labs  Lab 11/13/17 0759 11/14/17 0744  GLUCAP 138* 145*   Lipid Profile: No results for input(s): CHOL, HDL, LDLCALC, TRIG, CHOLHDL, LDLDIRECT in the last 72 hours. Thyroid Function Tests: No results for input(s): TSH, T4TOTAL, FREET4,  T3FREE, THYROIDAB in the last 72 hours. Anemia Panel: No results for input(s): VITAMINB12, FOLATE, FERRITIN, TIBC, IRON, RETICCTPCT in the last 72 hours. Sepsis Labs: Recent Labs  Lab 11/12/17 2114 11/13/17 0134  LATICACIDVEN 2.16* 1.4    No results found for this or any previous visit (from the past 240 hour(s)).       Radiology Studies: Ct Abdomen Pelvis W Contrast  Result Date: 11/12/2017 CLINICAL DATA:  Vomiting black emesis for 3 days with swelling EXAM: CT ABDOMEN AND PELVIS WITH CONTRAST TECHNIQUE: Multidetector CT imaging of the abdomen and pelvis was performed using the standard protocol following bolus administration of intravenous contrast. CONTRAST:  ISOVUE-300 IOPAMIDOL (ISOVUE-300) INJECTION 61% COMPARISON:  Head CT 05/17/2017, CT abdomen pelvis 11/09/2016 FINDINGS: Lower chest: Lung bases demonstrate mild ground-glass density at the left base. No pleural effusion. Cardiomegaly with partially visualized cardiac pacing leads. Coronary calcification. Bilateral gynecomastia. Hepatobiliary: Subcentimeter hypodensity anterior dome of liver too small to characterize. Calcified stone in the gallbladder. No biliary dilatation.  Pancreas: Unremarkable. No pancreatic ductal dilatation or surrounding inflammatory changes. Spleen: Normal in size without focal abnormality. Adrenals/Urinary Tract: Mild thickening of the adrenal glands without discrete mass. Cyst in the upper pole of the right kidney. No hydronephrosis. Thick-walled appearance of the urinary bladder. Stomach/Bowel: Dilated fluid-filled stomach. Multiple loops of dilated small bowel measuring up to 4.7 cm. Collapsed distal small bowel with transition point in the anterior abdomen near the level of umbilical hernia, series 2, image number 66. No colon wall thickening. Normal appendix. Vascular/Lymphatic: Tortuous aorta with extensive calcification. No aneurysmal dilatation. Azygos continuation of the IVC with absent intra  hepatic segment of IVC. No significantly enlarged lymph nodes. Reproductive: Enlarged prostate gland with calcification Other: Negative for free air or free fluid. Periumbilical fat containing hernia with small amount of fluid in the hernia sac. Musculoskeletal: Degenerative changes of the spine. No acute or suspicious lesion IMPRESSION: 1. Dilated stomach and small bowel, consistent with mechanical small bowel obstruction, transition point visualized within the anterior abdomen at the level of the patient's umbilical hernia, findings likely due to adhesion. 2. Thick-walled appearance of the urinary bladder, query cystitis versus chronic obstruction. Prostate is enlarged. 3. Calcified gallstones 4. Cardiomegaly. 5. Gynecomastia 6. Azygos continuation of the IVC. Electronically Signed   By: Jasmine Pang M.D.   On: 11/12/2017 22:16   Dg Abd Portable 1v  Result Date: 11/14/2017 CLINICAL DATA:  Small bowel obstruction. EXAM: PORTABLE ABDOMEN - 1 VIEW COMPARISON:  Abdominal CT 11/12/2017 FINDINGS: Gas dilated loop of bowel in the left upper quadrant measuring up to 6 cm. Given adjacent transverse colonic segment containing stool this is likely progressively dilated small bowel. No interval evacuation of colon. Moderate gaseous distention of the stomach. Cholelithiasis. IMPRESSION: Gas dilated bowel in the left upper quadrant favoring small bowel over colon and compatible with ongoing partial small bowel obstruction. No interval evacuation of the colon. Electronically Signed   By: Marnee Spring M.D.   On: 11/14/2017 08:59        Scheduled Meds: . diatrizoate meglumine-sodium  90 mL Per NG tube Once  . diltiazem  10 mg Intravenous Q4H  . sodium chloride flush  3 mL Intravenous Q12H   Continuous Infusions: . dextrose 5 % and 0.9% NaCl 75 mL/hr at 11/13/17 0955  . famotidine (PEPCID) IV 20 mg (11/14/17 1155)     LOS: 2 days    Time spent: More than 50% of that time was spent in counseling and/or  coordination of care.      Meredith Leeds, MD [facility] Pager [phone]  If 7PM-7AM, please contact night-coverage [email or web address] Password [email or web address] 11/14/2017, 1:35 PM

## 2017-11-15 ENCOUNTER — Inpatient Hospital Stay (HOSPITAL_COMMUNITY): Payer: Medicare HMO

## 2017-11-15 ENCOUNTER — Encounter: Payer: Self-pay | Admitting: Neurology

## 2017-11-15 LAB — BASIC METABOLIC PANEL
Anion gap: 9 (ref 5–15)
BUN: 23 mg/dL — ABNORMAL HIGH (ref 6–20)
CO2: 29 mmol/L (ref 22–32)
Calcium: 8.3 mg/dL — ABNORMAL LOW (ref 8.9–10.3)
Chloride: 105 mmol/L (ref 101–111)
Creatinine, Ser: 0.71 mg/dL (ref 0.61–1.24)
GFR calc Af Amer: >60 mL/min (ref >60)
GFR calc non Af Amer: >60 mL/min (ref >60)
Glucose, Bld: 114 mg/dL — ABNORMAL HIGH (ref 65–99)
Potassium: 3.3 mmol/L — ABNORMAL LOW (ref 3.5–5.1)
Sodium: 143 mmol/L (ref 135–145)

## 2017-11-15 LAB — GLUCOSE, CAPILLARY: Glucose-Capillary: 116 mg/dL — ABNORMAL HIGH (ref 65–99)

## 2017-11-15 LAB — PROTIME-INR
INR: 1.63
Prothrombin Time: 19.2 seconds — ABNORMAL HIGH (ref 11.4–15.2)

## 2017-11-15 MED ORDER — POTASSIUM CHLORIDE 10 MEQ/100ML IV SOLN
10.0000 meq | INTRAVENOUS | Status: AC
  Administered 2017-11-15 (×4): 10 meq via INTRAVENOUS
  Filled 2017-11-15 (×4): qty 100

## 2017-11-15 NOTE — Progress Notes (Signed)
   Subjective/Chief Complaint: No complaints. Passed flatus yesterday   Objective: Vital signs in last 24 hours: Temp:  [97.9 F (36.6 C)-98.9 F (37.2 C)] 97.9 F (36.6 C) (03/12 0402) Pulse Rate:  [90-100] 99 (03/12 0402) Resp:  [16-18] 17 (03/12 0402) BP: (99-114)/(70-82) 114/82 (03/12 0402) SpO2:  [93 %-97 %] 94 % (03/12 0402) Weight:  [96.3 kg (212 lb 4.9 oz)] 96.3 kg (212 lb 4.9 oz) (03/12 0402) Last BM Date: 11/14/17  Intake/Output from previous day: 03/11 0701 - 03/12 0700 In: 1250 [I.V.:1200; IV Piggyback:50] Out: 1610 [Urine:720; Drains:850] Intake/Output this shift: No intake/output data recorded.  General appearance: alert and cooperative Resp: clear to auscultation bilaterally Cardio: regular rate and rhythm GI: soft, nontender. much less distended  Lab Results:  Recent Labs    11/12/17 2005 11/13/17 0134  WBC 4.1 3.6*  HGB 16.1 15.5  HCT 47.5 46.4  PLT 235 210   BMET Recent Labs    11/14/17 0501 11/15/17 0504  NA 142 143  K 3.5 3.3*  CL 103 105  CO2 30 29  GLUCOSE 152* 114*  BUN 30* 23*  CREATININE 0.90 0.71  CALCIUM 8.4* 8.3*   PT/INR Recent Labs    11/14/17 0501 11/15/17 0504  LABPROT 51.8* 19.2*  INR 5.82* 1.63   ABG No results for input([name]): PHART, HCO3 in the last 72 hours.  Invalid input([name]): PCO2, PO2  Studies/Results: Dg Abd Portable 1v-small Bowel Obstruction Protocol-initial, 8 Hr Delay  Result Date: 11/14/2017 CLINICAL DATA:  Small-bowel obstruction protocol. 8 hour delayed image. EXAM: PORTABLE ABDOMEN - 1 VIEW COMPARISON:  Abdominal radiograph performed earlier today at 8:28 a.m. FINDINGS: Residual contrast is noted within the stomach. Contrast is also seen within small bowel loops. The colon is difficult to fully characterize. There is distention of multiple small bowel loops. This appears mildly improved from the prior study, but is concerning for persistent partial small bowel obstruction. No acute osseous  abnormalities are identified. No free intra-abdominal air is seen, though evaluation for free air is limited on a single supine view. IMPRESSION: Distention of multiple small bowel loops appears mildly improved from the prior study, but remains concerning for partial small bowel obstruction. Contrast noted within small bowel loops and the stomach. Electronically Signed   By: Garald Balding M.D.   On: 11/14/2017 22:44   Dg Abd Portable 1v  Result Date: 11/14/2017 CLINICAL DATA:  Small bowel obstruction. EXAM: PORTABLE ABDOMEN - 1 VIEW COMPARISON:  Abdominal CT 11/12/2017 FINDINGS: Gas dilated loop of bowel in the left upper quadrant measuring up to 6 cm. Given adjacent transverse colonic segment containing stool this is likely progressively dilated small bowel. No interval evacuation of colon. Moderate gaseous distention of the stomach. Cholelithiasis. IMPRESSION: Gas dilated bowel in the left upper quadrant favoring small bowel over colon and compatible with ongoing partial small bowel obstruction. No interval evacuation of the colon. Electronically Signed   By: Monte Fantasia M.D.   On: 11/14/2017 08:59    Anti-infectives: Anti-infectives (From admission, onward)   None      Assessment/Plan: [name]/p * No surgery found * Continue with g tube to drain  Recheck abd xray today sbo seems to be improving  LOS: 3 days    Collin [name],Collin [name] 11/15/2017

## 2017-11-15 NOTE — Progress Notes (Signed)
PROGRESS NOTE    Collin [name]  YNW:[id number] DOB: Jan 05, 1941 DOA: 11/12/2017 PCP: [name], MD   Brief Narrative: Patient is a 77 year old male with past medical history of  A. 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  Patient reported not having a bowel movement for more than 3 days. Patient was found to have small bowel obstruction.  History of small bowel obstruction in the past.  Started on conservative management with drain, n.p.o. status , IV fluids and pain medications.  Assessment & Plan:   Principal Problem:   SBO (small bowel obstruction) (HCC) Active Problems:   PAF (paroxysmal atrial fibrillation) (HCC)   Chronic diastolic CHF (congestive heart failure) (HCC)   Malignant neoplasm of base of tongue (HCC)   AKI (acute kidney injury) (HCC)   Supratherapeutic INR   Small bowel obstruction: Continue conservative management.  General surgery following.  Abdomen pain is better.  He has bowel sounds.  Does not have a bowel movement yet. Passed flatus . Surgery following. He has history of small bowel obstruction in the past.CT findings were consistent with SBO due to adhesions. Continue gentle IV fluids.  Continue analgesics and antiemetics.  Drain  was  attached to the G-tube.    Atrial fibrillation: Currently heart rate is controlled.  Was on warfarin at home.  INR was supratherapeutic but subtherapeutic today.  Continue daily monitoring of INR. Was given 5 mg of Vitamin K IV yesterday.  I think we can hold on warfarin, in case he needs surgery.  Chronic diastolic CHF: On torsemide at home.  Currently it is on hold and patient is on gentle IV fluid.  We will be cautious with IV fluids.  Tongue cancer: Status post  resection and radiation.Status post G-tube placement but patient was also taking fluid from mouth.  Acute kidney injury: Kidney function stable.  Continue gentle IV fluids.  Avoid nephrotoxins  Hypokalemia: Being supplemented.    DVT prophylaxis: SCD Code Status: Full Family Communication: None present at the bedside Disposition Plan: Home after resolution of small bowel obstruction   Consultants: General surgery  Procedures: None  Antimicrobials: None  Subjective: Patient seen and examined the bedside this morning.  Remains comfortable.  Abdominal pain has improved.  Has not had a bowel movement yet.  Passing flatus.  Objective: Vitals:   11/14/17 0421 11/14/17 1237 11/14/17 2202 11/15/17 0402  BP: 97/66 105/70 99/71 114/82  Pulse: (!) 104 90 100 99  Resp: 17 18 16 17   Temp: 99.2 F (37.3 C) 98.4 F (36.9 C) 98.9 F (37.2 C) 97.9 F (36.6 C)  TempSrc: Oral Oral Oral Oral  SpO2: 95% 93% 97% 94%  Weight: 97 kg (213 lb 13.5 oz)   96.3 kg (212 lb 4.9 oz)  Height:        Intake/Output Summary (Last 24 hours) at 11/15/2017 1232 Last data filed at 11/15/2017 0115 Gross per 24 hour  Intake 600 ml  Output 1570 ml  Net -970 ml   Filed Weights   11/13/17 0438 11/14/17 0421 11/15/17 0402  Weight: 97 kg (213 lb 12.8 oz) 97 kg (213 lb 13.5 oz) 96.3 kg (212 lb 4.9 oz)    Examination:  General exam: Appears calm and comfortable ,Not in distress,average built HEENT:PERRL,Oral mucosa moist, Ear/Nose normal on gross exam  Respiratory system: Bilateral equal air entry, normal vesicular breath sounds, no wheezes or crackles  Cardiovascular system: S1 & S2 heard, RRR. No JVD, murmurs, rubs, gallops or clicks. Gastrointestinal system: Abdomen is nondistended, soft and nontender. No organomegaly or masses felt. Normal bowel sounds heard.G tube attached to drain.  There was dark greenish  fluid in the bag Central nervous system: Alert and oriented. No focal neurological  deficits. Extremities: No edema, no clubbing ,no cyanosis, distal peripheral pulses palpable. Skin: No rashes, lesions or ulcers,no icterus ,no pallor MSK: Normal muscle bulk,tone ,power Psychiatry: Judgement and insight appear normal. Mood & affect appropriate.    Data Reviewed: I have personally reviewed following labs and imaging studies  CBC: Recent Labs  Lab 11/10/17 1201 11/12/17 2005 11/13/17 0134  WBC 3.2* 4.1 3.6*  HGB 13.8 16.1 15.5  HCT 41.5 47.5 46.4  MCV 94 93.5 94.5  PLT 171 235 210   Basic Metabolic Panel: Recent Labs  Lab 11/10/17 1201 11/12/17 2005 11/13/17 0134 11/14/17 0501 11/15/17 0504  NA 143 142 146* 142 143  K 4.3 4.0 3.4* 3.5 3.3*  CL 103 96* 97* 103 105  CO2 25 30 35* 30 29  GLUCOSE 94 158* 155* 152* 114*  BUN 20 36* 37* 30* 23*  CREATININE 0.67* 1.16 1.12 0.90 0.71  CALCIUM 9.4 9.9 9.5 8.4* 8.3*  MG  --   --  1.8  --   --    GFR: Estimated Creatinine Clearance: 94.6 mL/min (by C-G formula based on SCr of 0.71 mg/dL). Liver Function Tests: Recent Labs  Lab 11/12/17 2005 11/13/17 0134  AST 34 28  ALT 57 51  ALKPHOS 149* 139*  BILITOT 0.8 0.8  PROT 8.3* 7.7  ALBUMIN 4.2 4.2   Recent Labs  Lab 11/12/17 2005  LIPASE 27   No results for input(s): AMMONIA in the last 168 hours. Coagulation Profile: Recent Labs  Lab 11/12/17 2107 11/13/17 0134 11/14/17 0501 11/15/17 0504  INR 3.53 3.98 5.82* 1.63   Cardiac Enzymes: No results for input(s): CKTOTAL, CKMB, CKMBINDEX, TROPONINI in the last 168 hours. BNP (last 3 results) Recent Labs    04/25/17 1606 06/15/17 1136 11/10/17 1201  PROBNP 676* 1,015* 1,116*   HbA1C: No results for input(s): HGBA1C in the last 72 hours. CBG: Recent Labs  Lab 11/13/17 0759 11/14/17 0744 11/15/17 0741  GLUCAP 138* 145* 116*   Lipid Profile: No results for input(s): CHOL, HDL, LDLCALC, TRIG, CHOLHDL, LDLDIRECT in the last 72 hours. Thyroid Function Tests: No results for input(s): TSH,  T4TOTAL, FREET4, T3FREE, THYROIDAB in the last 72 hours. Anemia Panel: No results for input(s): VITAMINB12, FOLATE, FERRITIN, TIBC, IRON, RETICCTPCT in the last 72 hours. Sepsis Labs: Recent Labs  Lab 11/12/17 2114 11/13/17 0134  LATICACIDVEN 2.16* 1.4    No results found for this or any previous visit (from the past 240 hour(s)).       Radiology Studies: Dg Abd 2 Views  Result Date: 11/15/2017 CLINICAL DATA:  Follow-up small bowel obstruction EXAM: ABDOMEN - 2 VIEW COMPARISON:  11/14/2017 FINDINGS: There is gaseous distention of small bowel and colon. There is oral contrast material which has transited into the colon. There is no bowel dilatation to suggest obstruction. There is no evidence of pneumoperitoneum, portal venous gas or pneumatosis. There are no pathologic calcifications along the expected course of the ureters. The osseous structures are unremarkable. IMPRESSION: No evidence of bowel obstruction. Electronically Signed   By: Elige Ko   On: 11/15/2017 10:49  Dg Abd Portable 1v-small Bowel Obstruction Protocol-initial, 8 Hr Delay  Result Date: 11/14/2017 CLINICAL DATA:  Small-bowel obstruction protocol. 8 hour delayed image. EXAM: PORTABLE ABDOMEN - 1 VIEW COMPARISON:  Abdominal radiograph performed earlier today at 8:28 a.m. FINDINGS: Residual contrast is noted within the stomach. Contrast is also seen within small bowel loops. The colon is difficult to fully characterize. There is distention of multiple small bowel loops. This appears mildly improved from the prior study, but is concerning for persistent partial small bowel obstruction. No acute osseous abnormalities are identified. No free intra-abdominal air is seen, though evaluation for free air is limited on a single supine view. IMPRESSION: Distention of multiple small bowel loops appears mildly improved from the prior study, but remains concerning for partial small bowel obstruction. Contrast noted within small bowel  loops and the stomach. Electronically Signed   By: Roanna Raider M.D.   On: 11/14/2017 22:44   Dg Abd Portable 1v  Result Date: 11/14/2017 CLINICAL DATA:  Small bowel obstruction. EXAM: PORTABLE ABDOMEN - 1 VIEW COMPARISON:  Abdominal CT 11/12/2017 FINDINGS: Gas dilated loop of bowel in the left upper quadrant measuring up to 6 cm. Given adjacent transverse colonic segment containing stool this is likely progressively dilated small bowel. No interval evacuation of colon. Moderate gaseous distention of the stomach. Cholelithiasis. IMPRESSION: Gas dilated bowel in the left upper quadrant favoring small bowel over colon and compatible with ongoing partial small bowel obstruction. No interval evacuation of the colon. Electronically Signed   By: Marnee Spring M.D.   On: 11/14/2017 08:59        Scheduled Meds: . diltiazem  10 mg Intravenous Q4H  . sodium chloride flush  3 mL Intravenous Q12H   Continuous Infusions: . dextrose 5 % and 0.9% NaCl 75 mL/hr at 11/15/17 0355  . famotidine (PEPCID) IV Stopped (11/15/17 1114)     LOS: 3 days    Time spent: More than 50% of that time was spent in counseling and/or coordination of care.      Meredith Leeds, MD [facility] Pager [phone]  If 7PM-7AM, please contact night-coverage [email or web address] Password [facility] 11/15/2017, 12:32 PM

## 2017-11-16 ENCOUNTER — Other Ambulatory Visit: Payer: Self-pay | Admitting: *Deleted

## 2017-11-16 DIAGNOSIS — C01 Malignant neoplasm of base of tongue: Secondary | ICD-10-CM

## 2017-11-16 DIAGNOSIS — N179 Acute kidney failure, unspecified: Secondary | ICD-10-CM

## 2017-11-16 DIAGNOSIS — K56609 Unspecified intestinal obstruction, unspecified as to partial versus complete obstruction: Secondary | ICD-10-CM

## 2017-11-16 DIAGNOSIS — Z0189 Encounter for other specified special examinations: Secondary | ICD-10-CM

## 2017-11-16 DIAGNOSIS — I480 Paroxysmal atrial fibrillation: Secondary | ICD-10-CM

## 2017-11-16 DIAGNOSIS — I5032 Chronic diastolic (congestive) heart failure: Secondary | ICD-10-CM

## 2017-11-16 LAB — BASIC METABOLIC PANEL
Anion gap: 8 (ref 5–15)
BUN: 19 mg/dL (ref 6–20)
CO2: 27 mmol/L (ref 22–32)
Calcium: 8.4 mg/dL — ABNORMAL LOW (ref 8.9–10.3)
Chloride: 108 mmol/L (ref 101–111)
Creatinine, Ser: 0.65 mg/dL (ref 0.61–1.24)
GFR calc Af Amer: >60 mL/min (ref >60)
GFR calc non Af Amer: >60 mL/min (ref >60)
Glucose, Bld: 110 mg/dL — ABNORMAL HIGH (ref 65–99)
Potassium: 3.7 mmol/L (ref 3.5–5.1)
Sodium: 143 mmol/L (ref 135–145)

## 2017-11-16 LAB — CBC WITH DIFFERENTIAL/PLATELET
Basophils Absolute: 0 K/uL (ref 0.0–0.1)
Basophils Relative: 0 %
Eosinophils Absolute: 0.1 K/uL (ref 0.0–0.7)
Eosinophils Relative: 2 %
HCT: 41 % (ref 39.0–52.0)
Hemoglobin: 12.9 g/dL — ABNORMAL LOW (ref 13.0–17.0)
Lymphocytes Relative: 12 %
Lymphs Abs: 0.5 K/uL — ABNORMAL LOW (ref 0.7–4.0)
MCH: 30.8 pg (ref 26.0–34.0)
MCHC: 31.5 g/dL (ref 30.0–36.0)
MCV: 97.9 fL (ref 78.0–100.0)
Monocytes Absolute: 0.2 K/uL (ref 0.1–1.0)
Monocytes Relative: 4 %
Neutro Abs: 3.2 K/uL (ref 1.7–7.7)
Neutrophils Relative %: 82 %
Platelets: 169 K/uL (ref 150–400)
RBC: 4.19 MIL/uL — ABNORMAL LOW (ref 4.22–5.81)
RDW: 14.6 % (ref 11.5–15.5)
WBC: 4 K/uL (ref 4.0–10.5)

## 2017-11-16 LAB — PROTIME-INR
INR: 1.31
Prothrombin Time: 16.2 s — ABNORMAL HIGH (ref 11.4–15.2)

## 2017-11-16 LAB — GLUCOSE, CAPILLARY: Glucose-Capillary: 104 mg/dL — ABNORMAL HIGH (ref 65–99)

## 2017-11-16 MED ORDER — HEPARIN BOLUS VIA INFUSION
3000.0000 [IU] | Freq: Once | INTRAVENOUS | Status: AC
  Administered 2017-11-16: 3000 [IU] via INTRAVENOUS
  Filled 2017-11-16: qty 3000

## 2017-11-16 MED ORDER — DIGOXIN 125 MCG PO TABS
0.1250 mg | ORAL_TABLET | Freq: Every day | ORAL | Status: DC
  Administered 2017-11-16 – 2017-11-18 (×3): 0.125 mg via ORAL
  Filled 2017-11-16 (×3): qty 1

## 2017-11-16 MED ORDER — HEPARIN (PORCINE) IN NACL 100-0.45 UNIT/ML-% IJ SOLN
1450.0000 [IU]/h | INTRAMUSCULAR | Status: DC
  Administered 2017-11-16 – 2017-11-17 (×2): 1450 [IU]/h via INTRAVENOUS
  Filled 2017-11-16 (×2): qty 250

## 2017-11-16 MED ORDER — DILTIAZEM HCL 90 MG PO TABS
90.0000 mg | ORAL_TABLET | Freq: Every day | ORAL | Status: DC
  Administered 2017-11-17 – 2017-11-18 (×2): 90 mg via ORAL
  Filled 2017-11-16 (×2): qty 1

## 2017-11-16 NOTE — Care Management Important Message (Signed)
Important Message  Patient Details  Name: Collin [name] MRN: [id number] Date of Birth: 14-Dec-1940   Medicare Important Message Given:  Yes    Kerin Salen 11/16/2017, 11:41 AMImportant Message  Patient Details  Name: Collin [name] MRN: [id number] Date of Birth: August 22, 1941   Medicare Important Message Given:  Yes    Kerin Salen 11/16/2017, 11:41 AM

## 2017-11-16 NOTE — Evaluation (Signed)
Clinical/Bedside Swallow Evaluation Patient Details  Name: Collin [name] MRN: [id number] Date of Birth: 11-03-1940  Today's Date: 11/16/2017 Time: SLP Start Time (ACUTE ONLY): 1345 SLP Stop Time (ACUTE ONLY): 1432 SLP Time Calculation (min) (ACUTE ONLY): 47 min  Past Medical History:  Past Medical History:  Diagnosis Date  . AICD (automatic cardioverter/defibrillator) present   . Atrial fibrillation (HCC)   . Cancer (HCC)    tongue cancer  . Cardiomyopathy, nonischemic (HCC) 1998  . CHF (congestive heart failure) (HCC)   . Claustrophobia   . Diabetes mellitus   . Dysrhythmia   . Heart disease   . History of echocardiogram    Echo 8/18:  EF 55-60, no RWMA, severe LAE, normal RVSF, mild RAE  . History of radiation therapy 12/07/16- 01/24/17   Base of Tongue 70 Gy in 35 fractions  . Hyperlipidemia   . OSA (obstructive sleep apnea)   . Shingles   . Umbilical hernia   . 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 It sometimes shows as a shadow on scans.    Past Surgical History:  Past Surgical History:  Procedure Laterality Date  . CARDIAC DEFIBRILLATOR PLACEMENT  2006  . CATARACT EXTRACTION Bilateral    01/07/2016. 01/22/2016  . CYST EXCISION     multiple drainage to a cyst in his neck.   Marland Kitchen DIRECT LARYNGOSCOPY N/A 11/16/2016   Procedure: DIRECT LARYNGOSCOPY AND BIOPSY;  Surgeon: [name], MD;  Location[facility] OR;  Service: ENT;  Laterality: N/A;  . EYE SURGERY    . HERNIA REPAIR     1950's  . IMPLANTABLE CARDIOVERTER DEFIBRILLATOR (ICD) GENERATOR CHANGE N/A 05/10/2014   Procedure: ICD GENERATOR CHANGE;  Surgeon: [name], MD;  Location[facility] CATH LAB;  Service: Cardiovascular;  Laterality: N/A;  . IR GASTROSTOMY TUBE MOD SED  12/27/2016  . IR PATIENT EVAL TECH 0-60 MINS  01/17/2017  . MASS EXCISION Right 11/16/2016   Procedure: EXCISION RIGHT NECK LIPOMA;  Surgeon: [name], MD;  Location[facility] OR;  Service:  ENT;  Laterality: Right;  . MOHS SURGERY  01/01/2015   to top of head  . MOUTH SURGERY     teeth removed 4 teeth extracted 02/2010  . RADICAL NECK DISSECTION Right 07/25/2017   Procedure: RIGHT NECK DISSECTION;  Surgeon: [name], MD;  Location: [facility] OR;  Service: ENT;  Laterality: Right;   HPI:  77 yo male adm to [facility] with SBO, Pt is currently hooked up to foley back through G tube and is on a full liquid diet.  PMH + for right tongue base cancer s/p XRT done 01/2017 and surgery.  G tube placed 12/2016.  Swallow evaluation ordered by MD.  Pt denies weight loss nor pulmonary infections.     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  He demonstrates clinical indications of pharyngeal-cervical esophageal dysphagia c/b throat clearing, coughing and suspected decreased laryngeal elevation.  Pt admits he coughs with liquids frequently and expectorates solids during meals.  Suspect chronic aspiration that he is managing at this time.    Pt provided SlP with specific details regarding his swallowing compensation strategies.  He denies pneumonias nor weight loss.  Extensive discussion regarding indication for pt to continue his HEP and maximum intake to decrease muscular fibrosis using teach back.  Also reviewed indication for heimlich manuever and s/s of aspiration pna.  No SLP follow up as pt managing his chronic dysphagia.  Pt DOES take medication via PEG tube due to his dysphagia.  Recommend continue diet as tolerated.  No SLP follow up needed.  Thanks for this consult.   SLP Visit Diagnosis: Dysphagia, pharyngoesophageal phase (R13.14);Dysphagia, pharyngeal phase (R13.13)    Aspiration Risk  Mild aspiration risk;Moderate aspiration risk    Diet Recommendation Thin liquid;Nectar-thick liquid(softer solid foods)   Liquid Administration via: Cup Medication Administration: Other (Comment)(via  PEG per pt) Supervision: Patient able to self feed Compensations: Small sips/bites;Slow rate;Follow solids with liquid;Multiple dry swallows after each bite/sip Postural Changes: Seated upright at 90 degrees;Remain upright for at least 30 minutes after po intake    Other  Recommendations Oral Care Recommendations: Oral care before and after PO   Follow up Recommendations None      Frequency and Duration   n/a         Prognosis   n/a    Swallow Study   General Date of Onset: 11/16/17 HPI: 77 yo male adm to [facility] with SBO, Pt is currently hooked up to foley back through G tube and is on a full liquid diet.  PMH + for right tongue base cancer s/p XRT done 01/2017 and surgery.  G tube placed 12/2016.  Swallow evaluation ordered by MD.  Pt denies weight loss nor pulmonary infections.   Type of Study: Bedside Swallow Evaluation Previous Swallow Assessment: bse 2018- Diet Prior to this Study: Thin liquids(full liquids) Temperature Spikes Noted: No Respiratory Status: Room air History of Recent Intubation: No Behavior/Cognition: Alert;Cooperative;Pleasant mood Oral Cavity Assessment: Within Functional Limits Oral Care Completed by SLP: No Oral Cavity - Dentition: Adequate natural dentition Vision: Functional for self-feeding Self-Feeding Abilities: Able to feed self Patient Positioning: Upright in bed Baseline Vocal Quality: Normal Volitional Cough: Strong Volitional Swallow: Able to elicit    Oral/Motor/Sensory Function Overall Oral Motor/Sensory Function: Within functional limits   Ice Chips Ice chips: Impaired Presentation: Spoon Pharyngeal Phase Impairments: Multiple swallows;Decreased hyoid-laryngeal movement   Thin Liquid Thin Liquid: Impaired Presentation: Cup Pharyngeal  Phase Impairments: Multiple swallows;Throat Clearing - Immediate;Decreased hyoid-laryngeal movement    Nectar Thick Nectar Thick Liquid: Not tested   Honey Thick Honey Thick Liquid: Not tested   Puree  Puree: Impaired Presentation: Self Fed;Spoon Pharyngeal Phase Impairments: Multiple swallows;Throat Clearing - Immediate   Solid   GO   Solid: Not tested        Chales Abrahams 11/16/2017,2:52 PM  Donavan Burnet, MS [facility] SLP [phone]

## 2017-11-16 NOTE — Progress Notes (Signed)
PROGRESS NOTE    Collin [name]  UJW:[id number] DOB: 10-20-1940 DOA: 11/12/2017 PCP: [name], MD   Brief Narrative: Patient is a 77 year old male with past medical history of  A. 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  Patient reported not having a bowel movement for more than 3 days. Patient was found to have small bowel obstruction.  History of small bowel obstruction in the past. Started on conservative management with drain, n.p.o. status , IV fluids and pain medications. He is slowly improving and General Surgery advanced diet to Full Liquid Diet today.   Assessment & Plan:   Principal Problem:   SBO (small bowel obstruction) (HCC) Active Problems:   PAF (paroxysmal atrial fibrillation) (HCC)   Chronic diastolic CHF (congestive heart failure) (HCC)   Malignant neoplasm of base of tongue (HCC)   AKI (acute kidney injury) (HCC)   Supratherapeutic INR  Small Bowel Obstruction:  -Continue conservative management.   -General Surgery following and appreciate Recc's.   -Abdomen pain is better and is passing flatus -He has history of small bowel obstruction in the past.CT findings were consistent with SBO due to adhesions. -Continue gentle IV fluids.   -Continue analgesics and antiemetics.   -Drain was attached to the G-tube and clamped -Diet being advanced by General Surgery and patient now on Full Liquid Diet.   Atrial Fibrillation -Currently heart rate is controlled. IV Diltiazem 10 mg q4h changed to po 90 mg po Daily and restarted home Digoxin 0.125 mg po Daily -Will resume Home Carvedilol in AM  -Was on warfarin at home.   -INR was supratherapeutic but subtherapeutic today.   -Continue daily monitoring of INR.  -Was  given 5 mg of Vitamin K IV 11/14/17 -Continue to hold Warfarin for now and resume when ok with General Surgery -In the Interim will start Heparin gtt  Chronic Diastolic CHF:  -On Torsemide at home.   -Currently it is on hold and patient is on gentle IV fluid with D5W NS at 75 mL/hr -Currently holding Carvedilol 25 mg po BID and will restart in AM  -We will be cautious with IV fluids and D/C in AM.  Tongue Cancer:  -Status post resection and radiation.Status post G-tube placement but patient was also taking fluid from mouth. -Now Diet advanced to Full Liquid Diet by General Surgery   Acute Kidney Injury: -Kidney function stable and Improved; BUN/Cr now 19/0.65 -Continue gentle IV fluids with NS at 75 mL/hr.   -Avoid Nephrotoxins -Repeat CMP in AM   Hypokalemia:  -Improved. K+ went from 3.3 -> 3.7 -Continue to Monitor and Replete as Necessary -Repeat CMP in AM  Normocytic Anemia -Patient's Hb/Hct went from 15.5/46.4 -> 12.9/41.0 -Continue to Monitor for S/Sx for Bleeding -Repeat CBC in AM   DVT prophylaxis:  Code Status: FULL CODE Family Communication: No family present at bedside  Disposition Plan: Anticipate D/C within next 24-48 hours if Medically stable   Consultants:   General Surgery   Procedures: None   Antimicrobials:  Anti-infectives (From admission, onward)   None     Subjective: Seen and examined and states he feels like he is improving. No nausea or vomiting. No CP. States he is passing flatus and abdomen is not hurting as much. No lightheadedness or dizziness. No other complaints or concerns  at this time.   Objective: Vitals:   11/15/17 0402 11/15/17 1959 11/16/17 0040 11/16/17 0443  BP: 114/82 (!) 126/57 118/84 120/90  Pulse: 99 92 99 93  Resp: 17 16  16   Temp: 97.9 F (36.6 C) 98.2 F (36.8 C)  98 F (36.7 C)  TempSrc: Oral Oral  Oral  SpO2: 94% 97%  99%  Weight: 96.3 kg (212 lb 4.9 oz)   97.7 kg (215 lb 6.2 oz)  Height:         Intake/Output Summary (Last 24 hours) at 11/16/2017 1610 Last data filed at 11/16/2017 0759 Gross per 24 hour  Intake 2175 ml  Output 350 ml  Net 1825 ml   Filed Weights   11/14/17 0421 11/15/17 0402 11/16/17 0443  Weight: 97 kg (213 lb 13.5 oz) 96.3 kg (212 lb 4.9 oz) 97.7 kg (215 lb 6.2 oz)   Examination: Physical Exam:  Constitutional: WN/WD Caucasian male in NAD and appears calm and comfortable Eyes: Lids and conjunctivae normal, sclerae anicteric  ENMT: External Ears, Nose appear normal. Grossly normal hearing. Mucous membranes are moist.  Neck: Appears normal, supple, no cervical masses, normal ROM, no appreciable thyromegaly, no JVD Respiratory: Diminished to auscultation bilaterally, no wheezing, rales, rhonchi or crackles. Normal respiratory effort and patient is not tachypenic. No accessory muscle use.  Cardiovascular: RRR, no murmurs / rubs / gallops. S1 and S2 auscultated. No extremity edema. 2+ pedal pulses. No carotid bruits.  Abdomen: Soft, non-tender, Slightly distended. Has a G-tube in place and an umbilical hernia noted.   GU: Deferred. Musculoskeletal: No clubbing / cyanosis of digits/nails. No joint deformity upper and lower extremities.  Skin: No rashes, lesions, ulcers on a limited skin evaluation. No induration; Warm and dry.  Neurologic: CN 2-12 grossly intact with no focal deficits. Romberg sign and cerebellar reflexes not assessed.  Psychiatric: Normal judgment and insight. Alert and oriented x 3. Normal mood and appropriate affect.   Data Reviewed: I have personally reviewed following labs and imaging studies  CBC: Recent Labs  Lab 11/10/17 1201 11/12/17 2005 11/13/17 0134 11/16/17 0435  WBC 3.2* 4.1 3.6* 4.0  NEUTROABS  --   --   --  3.2  HGB 13.8 16.1 15.5 12.9*  HCT 41.5 47.5 46.4 41.0  MCV 94 93.5 94.5 97.9  PLT 171 235 210 169   Basic Metabolic Panel: Recent Labs  Lab 11/12/17 2005 11/13/17 0134 11/14/17 0501 11/15/17 0504  11/16/17 0435  NA 142 146* 142 143 143  K 4.0 3.4* 3.5 3.3* 3.7  CL 96* 97* 103 105 108  CO2 30 35* 30 29 27   GLUCOSE 158* 155* 152* 114* 110*  BUN 36* 37* 30* 23* 19  CREATININE 1.16 1.12 0.90 0.71 0.65  CALCIUM 9.9 9.5 8.4* 8.3* 8.4*  MG  --  1.8  --   --   --    GFR: Estimated Creatinine Clearance: 95.1 mL/min (by C-G formula based on SCr of 0.65 mg/dL). Liver Function Tests: Recent Labs  Lab 11/12/17 2005 11/13/17 0134  AST 34 28  ALT 57 51  ALKPHOS 149* 139*  BILITOT 0.8 0.8  PROT 8.3* 7.7  ALBUMIN 4.2 4.2   Recent Labs  Lab 11/12/17 2005  LIPASE 27   No results for input(s): AMMONIA in the last 168 hours. Coagulation Profile: Recent Labs  Lab 11/12/17 2107 11/13/17 0134 11/14/17 0501 11/15/17 0504 11/16/17 0435  INR 3.53 3.98 5.82* 1.63 1.31   Cardiac Enzymes: No results for input(s): CKTOTAL, CKMB,  CKMBINDEX, TROPONINI in the last 168 hours. BNP (last 3 results) Recent Labs    04/25/17 1606 06/15/17 1136 11/10/17 1201  PROBNP 676* 1,015* 1,116*   HbA1C: No results for input(s): HGBA1C in the last 72 hours. CBG: Recent Labs  Lab 11/13/17 0759 11/14/17 0744 11/15/17 0741 11/16/17 0725  GLUCAP 138* 145* 116* 104*   Lipid Profile: No results for input(s): CHOL, HDL, LDLCALC, TRIG, CHOLHDL, LDLDIRECT in the last 72 hours. Thyroid Function Tests: No results for input(s): TSH, T4TOTAL, FREET4, T3FREE, THYROIDAB in the last 72 hours. Anemia Panel: No results for input(s): VITAMINB12, FOLATE, FERRITIN, TIBC, IRON, RETICCTPCT in the last 72 hours. Sepsis Labs: Recent Labs  Lab 11/12/17 2114 11/13/17 0134  LATICACIDVEN 2.16* 1.4    No results found for this or any previous visit (from the past 240 hour(s)).   Radiology Studies: Dg Abd 2 Views  Result Date: 11/15/2017 CLINICAL DATA:  Follow-up small bowel obstruction EXAM: ABDOMEN - 2 VIEW COMPARISON:  11/14/2017 FINDINGS: There is gaseous distention of small bowel and colon. There is oral  contrast material which has transited into the colon. There is no bowel dilatation to suggest obstruction. There is no evidence of pneumoperitoneum, portal venous gas or pneumatosis. There are no pathologic calcifications along the expected course of the ureters. The osseous structures are unremarkable. IMPRESSION: No evidence of bowel obstruction. Electronically Signed   By: Elige Ko   On: 11/15/2017 10:49   Dg Abd Portable 1v-small Bowel Obstruction Protocol-initial, 8 Hr Delay  Result Date: 11/14/2017 CLINICAL DATA:  Small-bowel obstruction protocol. 8 hour delayed image. EXAM: PORTABLE ABDOMEN - 1 VIEW COMPARISON:  Abdominal radiograph performed earlier today at 8:28 a.m. FINDINGS: Residual contrast is noted within the stomach. Contrast is also seen within small bowel loops. The colon is difficult to fully characterize. There is distention of multiple small bowel loops. This appears mildly improved from the prior study, but is concerning for persistent partial small bowel obstruction. No acute osseous abnormalities are identified. No free intra-abdominal air is seen, though evaluation for free air is limited on a single supine view. IMPRESSION: Distention of multiple small bowel loops appears mildly improved from the prior study, but remains concerning for partial small bowel obstruction. Contrast noted within small bowel loops and the stomach. Electronically Signed   By: Roanna Raider M.D.   On: 11/14/2017 22:44   Dg Abd Portable 1v  Result Date: 11/14/2017 CLINICAL DATA:  Small bowel obstruction. EXAM: PORTABLE ABDOMEN - 1 VIEW COMPARISON:  Abdominal CT 11/12/2017 FINDINGS: Gas dilated loop of bowel in the left upper quadrant measuring up to 6 cm. Given adjacent transverse colonic segment containing stool this is likely progressively dilated small bowel. No interval evacuation of colon. Moderate gaseous distention of the stomach. Cholelithiasis. IMPRESSION: Gas dilated bowel in the left upper  quadrant favoring small bowel over colon and compatible with ongoing partial small bowel obstruction. No interval evacuation of the colon. Electronically Signed   By: Marnee Spring M.D.   On: 11/14/2017 08:59   Scheduled Meds: . diltiazem  10 mg Intravenous Q4H  . sodium chloride flush  3 mL Intravenous Q12H   Continuous Infusions: . dextrose 5 % and 0.9% NaCl 75 mL/hr at 11/15/17 2233  . famotidine (PEPCID) IV Stopped (11/15/17 2318)    LOS: 4 days   Merlene Laughter, DO [facility] Pager [phone]  If 7PM-7AM, please contact night-coverage [email or web address] Password [email or web address] 11/16/2017, 8:21 AM

## 2017-11-16 NOTE — Progress Notes (Signed)
ANTICOAGULATION CONSULT NOTE - Initial Consult  Pharmacy Consult for Heparin Indication: atrial fibrillation  Allergies  Allergen Reactions  . Niaspan [Niacin Er] Other (See Comments)    REACTION NOT AN ALLERGY >  FLUSHED, EXPECTED W/NIACIN    Patient Measurements: Height: 6' (182.9 cm) Weight: 215 lb 6.2 oz (97.7 kg) IBW/kg (Calculated) : 77.6 Heparin Dosing Weight: 97 kg  Vital Signs: Temp: 97.6 F (36.4 C) (03/13 1430) Temp Source: Oral (03/13 1430) BP: 134/86 (03/13 1430) Pulse Rate: 89 (03/13 1430)  Labs: Recent Labs    11/14/17 0501 11/15/17 0504 11/16/17 0435  HGB  --   --  12.9*  HCT  --   --  41.0  PLT  --   --  169  LABPROT 51.8* 19.2* 16.2*  INR 5.82* 1.63 1.31  CREATININE 0.90 0.71 0.65    Estimated Creatinine Clearance: 95.1 mL/min (by C-G formula based on SCr of 0.65 mg/dL).   Medical History: Past Medical History:  Diagnosis Date  . AICD (automatic cardioverter/defibrillator) present   . Atrial fibrillation ([place])   . Cancer (HCC)    tongue cancer  . 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 CHF (congestive heart failure) ([place])   . Claustrophobia   . Diabetes mellitus   . Dysrhythmia   . Heart disease   . History of echocardiogram    Echo 8/18:  EF 55-60, no RWMA, severe LAE, normal RVSF, mild RAE  . History of radiation therapy 12/07/16- 01/24/17   Base of Tongue 70 Gy in 35 fractions  . Hyperlipidemia   . OSA (obstructive sleep apnea)   . Shingles   . Umbilical hernia   . 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 It sometimes shows as a shadow on scans.     Assessment:  77 yr male with PMH AFib, CHF, malignant tongue cancer admitted on 3/9 with SBO. Patient on warfarin PTA with INR on admission of 3.53, which increased to 5.82 on 3/11 and patient received Vitamin K 5mg  IV x 1 dose.  Surgery following patient and warfarin held in case surgical intervention of SBO needed.  Noted that  per xray on 3/12, SBO has resolved  Pharmacy consulted to dose IV heparin for AFib  Goal of Therapy:  Heparin level 0.3-0.7 units/ml Monitor platelets by anticoagulation protocol: Yes   Plan:  Give heparin 3000 unit IV bolus x 1 then begin IV heparin gtt @ 1450 units/hr (INR = 1.37) Check heparin level 8 hr after heparin started Follow heparin level & CBC daily  Paarth Cropper, Toribio Harbour, PharmD 11/16/2017,6:15 PM

## 2017-11-16 NOTE — Progress Notes (Signed)
[facility]/Trauma Progress Note      Assessment/Plan Principal Problem:   SBO (small bowel obstruction) (HCC) Active Problems:   PAF (paroxysmal atrial fibrillation) (HCC)   Chronic diastolic CHF (congestive heart failure) (HCC)   Malignant neoplasm of base of tongue (HCC)   AKI (acute kidney injury) (HCC)   Supratherapeutic INR  SBO - having flatus, NGT out, No nausea or vomiting, tolerating ice chips - abd xray yesterday showed no evidence of SBO  FEN: fulls VTE: SCD's, per medicine ID: none Follow up: TBD  DISPO: having flatus and a small BM. Advance diet to fulls    LOS: 4 days    Subjective: CC: SBO  Had a small BM and lots of flatus. No nausea or vomiting. No new complaints. Wants something to eat.  Objective: Vital signs in last 24 hours: Temp:  [98 F (36.7 C)-98.2 F (36.8 C)] 98 F (36.7 C) (03/13 0443) Pulse Rate:  [92-99] 93 (03/13 0443) Resp:  [16] 16 (03/13 0443) BP: (118-126)/(57-90) 120/90 (03/13 0443) SpO2:  [97 %-99 %] 99 % (03/13 0443) Weight:  [215 lb 6.2 oz (97.7 kg)] 215 lb 6.2 oz (97.7 kg) (03/13 0443) Last BM Date: 11/14/17  Intake/Output from previous day: 03/12 0701 - 03/13 0700 In: 2175 [I.V.:1525; IV Piggyback:650] Out: 350 [Urine:200; Drains:150] Intake/Output this shift: No intake/output data recorded.  PE: Gen:  Alert, NAD, pleasant, cooperative Pulm:  Rate and effort normal Abd: Soft, NT/ND, +BS, G tube in place with scant drainage, umbilical hernia easily reducible without pain Skin: no rashes noted, warm and dry   Anti-infectives: Anti-infectives (From admission, onward)   None      Lab Results:  Recent Labs    11/16/17 0435  WBC 4.0  HGB 12.9*  HCT 41.0  PLT 169   BMET Recent Labs    11/15/17 0504 11/16/17 0435  NA 143 143  K 3.3* 3.7  CL 105 108  CO2 29 27  GLUCOSE 114* 110*  BUN 23* 19  CREATININE 0.71 0.65  CALCIUM 8.3* 8.4*   PT/INR Recent Labs    11/15/17 0504  11/16/17 0435  LABPROT 19.2* 16.2*  INR 1.63 1.31   CMP     Component Value Date/Time   NA 143 11/16/2017 0435   NA 143 11/10/2017 1201   NA 138 01/17/2017 1125   K 3.7 11/16/2017 0435   K 4.4 01/17/2017 1125   CL 108 11/16/2017 0435   CO2 27 11/16/2017 0435   CO2 29 01/17/2017 1125   GLUCOSE 110 (H) 11/16/2017 0435   GLUCOSE 112 01/17/2017 1125   BUN 19 11/16/2017 0435   BUN 20 11/10/2017 1201   BUN 22.3 09/01/2017 1300   CREATININE 0.65 11/16/2017 0435   CREATININE 0.8 09/01/2017 1300   CALCIUM 8.4 (L) 11/16/2017 0435   CALCIUM 9.8 01/17/2017 1125   PROT 7.7 11/13/2017 0134   PROT 6.8 03/11/2017 1258   PROT 7.9 12/21/2016 1423   ALBUMIN 4.2 11/13/2017 0134   ALBUMIN 3.8 03/11/2017 1258   ALBUMIN 3.4 (L) 12/21/2016 1423   AST 28 11/13/2017 0134   AST 19 12/21/2016 1423   ALT 51 11/13/2017 0134   ALT 23 12/21/2016 1423   ALKPHOS 139 (H) 11/13/2017 0134   ALKPHOS 119 12/21/2016 1423   BILITOT 0.8 11/13/2017 0134   BILITOT 0.4 03/11/2017 1258   BILITOT 0.80 12/21/2016 1423   GFRNONAA >60 11/16/2017 0435   GFRAA >60 11/16/2017 0435   Lipase     Component Value  Date/Time   LIPASE 27 11/12/2017 2005    Studies/Results: Dg Abd 2 Views  Result Date: 11/15/2017 CLINICAL DATA:  Follow-up small bowel obstruction EXAM: ABDOMEN - 2 VIEW COMPARISON:  11/14/2017 FINDINGS: There is gaseous distention of small bowel and colon. There is oral contrast material which has transited into the colon. There is no bowel dilatation to suggest obstruction. There is no evidence of pneumoperitoneum, portal venous gas or pneumatosis. There are no pathologic calcifications along the expected course of the ureters. The osseous structures are unremarkable. IMPRESSION: No evidence of bowel obstruction. Electronically Signed   By: Kathreen Devoid   On: 11/15/2017 10:49   Dg Abd Portable 1v-small Bowel Obstruction Protocol-initial, 8 Hr Delay  Result Date: 11/14/2017 CLINICAL DATA:  Small-bowel  obstruction protocol. 8 hour delayed image. EXAM: PORTABLE ABDOMEN - 1 VIEW COMPARISON:  Abdominal radiograph performed earlier today at 8:28 a.m. FINDINGS: Residual contrast is noted within the stomach. Contrast is also seen within small bowel loops. The colon is difficult to fully characterize. There is distention of multiple small bowel loops. This appears mildly improved from the prior study, but is concerning for persistent partial small bowel obstruction. No acute osseous abnormalities are identified. No free intra-abdominal air is seen, though evaluation for free air is limited on a single supine view. IMPRESSION: Distention of multiple small bowel loops appears mildly improved from the prior study, but remains concerning for partial small bowel obstruction. Contrast noted within small bowel loops and the stomach. Electronically Signed   By: Garald Balding M.D.   On: 11/14/2017 22:44      Kalman Drape , [facility] Surgery 11/16/2017, 8:56 AM  Pager: [phone] Mon-Wed, Friday 7:00am-4:30pm Thurs 7am-11:30am  Consults: [phone]

## 2017-11-17 LAB — COMPREHENSIVE METABOLIC PANEL
ALT: 22 U/L (ref 17–63)
AST: 18 U/L (ref 15–41)
Albumin: 2.8 g/dL — ABNORMAL LOW (ref 3.5–5.0)
Alkaline Phosphatase: 82 U/L (ref 38–126)
Anion gap: 6 (ref 5–15)
BUN: 14 mg/dL (ref 6–20)
CO2: 26 mmol/L (ref 22–32)
Calcium: 8.3 mg/dL — ABNORMAL LOW (ref 8.9–10.3)
Chloride: 110 mmol/L (ref 101–111)
Creatinine, Ser: 0.61 mg/dL (ref 0.61–1.24)
GFR calc Af Amer: >60 mL/min (ref >60)
GFR calc non Af Amer: >60 mL/min (ref >60)
Glucose, Bld: 137 mg/dL — ABNORMAL HIGH (ref 65–99)
Potassium: 3.8 mmol/L (ref 3.5–5.1)
Sodium: 142 mmol/L (ref 135–145)
Total Bilirubin: 0.9 mg/dL (ref 0.3–1.2)
Total Protein: 5.8 g/dL — ABNORMAL LOW (ref 6.5–8.1)

## 2017-11-17 LAB — CBC WITH DIFFERENTIAL/PLATELET
Basophils Absolute: 0 10*3/uL (ref 0.0–0.1)
Basophils Relative: 0 %
Eosinophils Absolute: 0.1 10*3/uL (ref 0.0–0.7)
Eosinophils Relative: 2 %
HCT: 40.3 % (ref 39.0–52.0)
Hemoglobin: 13.2 g/dL (ref 13.0–17.0)
Lymphocytes Relative: 9 %
Lymphs Abs: 0.4 10*3/uL — ABNORMAL LOW (ref 0.7–4.0)
MCH: 31.1 pg (ref 26.0–34.0)
MCHC: 32.8 g/dL (ref 30.0–36.0)
MCV: 95 fL (ref 78.0–100.0)
Monocytes Absolute: 0.3 10*3/uL (ref 0.1–1.0)
Monocytes Relative: 5 %
Neutro Abs: 4.2 10*3/uL (ref 1.7–7.7)
Neutrophils Relative %: 84 %
Platelets: 178 10*3/uL (ref 150–400)
RBC: 4.24 MIL/uL (ref 4.22–5.81)
RDW: 14.2 % (ref 11.5–15.5)
WBC: 5 10*3/uL (ref 4.0–10.5)

## 2017-11-17 LAB — PROTIME-INR
INR: 1.34
Prothrombin Time: 16.5 seconds — ABNORMAL HIGH (ref 11.4–15.2)

## 2017-11-17 LAB — PHOSPHORUS: Phosphorus: 2.2 mg/dL — ABNORMAL LOW (ref 2.5–4.6)

## 2017-11-17 LAB — MAGNESIUM: Magnesium: 1.9 mg/dL (ref 1.7–2.4)

## 2017-11-17 LAB — HEPARIN LEVEL (UNFRACTIONATED): Heparin Unfractionated: 0.32 IU/mL (ref 0.30–0.70)

## 2017-11-17 MED ORDER — OSMOLITE 1.5 CAL PO LIQD
474.0000 mL | ORAL | Status: DC
  Administered 2017-11-18: 474 mL
  Filled 2017-11-17 (×2): qty 474

## 2017-11-17 MED ORDER — ROSUVASTATIN CALCIUM 10 MG PO TABS
10.0000 mg | ORAL_TABLET | Freq: Every day | ORAL | Status: DC
  Administered 2017-11-17 – 2017-11-18 (×2): 10 mg via ORAL
  Filled 2017-11-17 (×2): qty 1

## 2017-11-17 MED ORDER — OSMOLITE 1.5 CAL PO LIQD
355.0000 mL | ORAL | Status: DC
  Filled 2017-11-17 (×4): qty 474

## 2017-11-17 MED ORDER — CARVEDILOL 25 MG PO TABS
25.0000 mg | ORAL_TABLET | Freq: Two times a day (BID) | ORAL | Status: DC
  Administered 2017-11-17 – 2017-11-18 (×2): 25 mg via ORAL
  Filled 2017-11-17 (×3): qty 1

## 2017-11-17 MED ORDER — WARFARIN SODIUM 7.5 MG PO TABS
7.5000 mg | ORAL_TABLET | Freq: Once | ORAL | Status: AC
  Administered 2017-11-17: 7.5 mg via ORAL
  Filled 2017-11-17 (×2): qty 1

## 2017-11-17 MED ORDER — WARFARIN - PHARMACIST DOSING INPATIENT: Freq: Every day | Status: DC

## 2017-11-17 NOTE — Progress Notes (Signed)
ANTICOAGULATION CONSULT NOTE - Follow Up Consult  Pharmacy Consult for Heparin Indication: atrial fibrillation  Allergies  Allergen Reactions  . Niaspan [Niacin Er] Other (See Comments)    REACTION NOT AN ALLERGY >  FLUSHED, EXPECTED W/NIACIN    Patient Measurements: Height: 6' (182.9 cm) Weight: 221 lb 1.9 oz (100.3 kg) IBW/kg (Calculated) : 77.6 Heparin Dosing Weight:   Vital Signs: Temp: 98 F (36.7 C) (03/14 0439) Temp Source: Oral (03/14 0439) BP: 120/79 (03/14 0439) Pulse Rate: 104 (03/14 0439)  Labs: Recent Labs    11/15/17 0504 11/16/17 0435 11/17/17 0250  HGB  --  12.9* 13.2  HCT  --  41.0 40.3  PLT  --  169 178  LABPROT 19.2* 16.2* 16.5*  INR 1.63 1.31 1.34  HEPARINUNFRC  --   --  0.32  CREATININE 0.71 0.65 0.61    Estimated Creatinine Clearance: 96.3 mL/min (by C-G formula based on SCr of 0.61 mg/dL).   Medications:  Infusions:  . dextrose 5 % and 0.9% NaCl 75 mL/hr at 11/16/17 2116  . famotidine (PEPCID) IV Stopped (11/16/17 2155)  . heparin 1,450 Units/hr (11/16/17 1837)    Assessment: Patient with heparin level at goal.  No heparin issues noted.  While heparin level is a lower end of goal, many times in patients > 65yo, heparin level will drift higher, therefore will not increase at this time to maintain level within goal range.  Goal of Therapy:  Heparin level 0.3-0.7 units/ml Monitor platelets by anticoagulation protocol: Yes   Plan:  Continue heparin drip at current rate Recheck level at [address] [place] 11/17/2017,5:12 AM

## 2017-11-17 NOTE — Progress Notes (Signed)
PROGRESS NOTE    Collin [name]  WUJ:[id number] DOB: 1941-02-06 DOA: 11/12/2017 PCP: [name], MD   Brief Narrative: Patient is a 77 year old male with past medical history of  A. 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  Patient reported not having a bowel movement for more than 3 days. Patient was found to have small bowel obstruction.  History of small bowel obstruction in the past. Started on conservative management with drain, n.p.o. status , IV fluids and pain medications. He is slowly improving and General Surgery advanced diet to Soft Diet today. Anticipate D/C Home in AM.  Assessment & Plan:   Principal Problem:   SBO (small bowel obstruction) (HCC) Active Problems:   PAF (paroxysmal atrial fibrillation) (HCC)   Chronic diastolic CHF (congestive heart failure) (HCC)   Malignant neoplasm of base of tongue (HCC)   AKI (acute kidney injury) (HCC)   Supratherapeutic INR  Small Bowel Obstruction, Improved  -Continued conservative management.   -General Surgery following and appreciate Recc's.   -Abdomen pain is better and is passing flatus -He has history of small bowel obstruction in the past. -CT findings were consistent with SBO due to adhesions. -Gentle IV fluids D/C'd.   -Continue analgesics and antiemetics.   -Drain was attached to the G-tube and clamped -Diet being advanced by General Surgery and patient now on Soft Diet  Atrial Fibrillation -Currently heart rate is controlled. IV Diltiazem 10 mg q4h changed to po 90 mg po Daily and restarted home Digoxin 0.125 mg po Daily -Resumed Home Carvedilol this AM  -Was on Warfarin at home and will resume -INR was supratherapeutic but subtherapeutic today at 1.34     -Continue daily monitoring of INR.  -Was given 5 mg of Vitamin K IV 11/14/17 -Heparin gtt started yesterday but will now D/C  Chronic Diastolic CHF:  -On Torsemide at home.   -IVF now D/C'd  -Resumed Carvedilol 25 mg po BID  -IVF D/C'd -Continue to Monitor Volume Status   Tongue Cancer:  -Status post resection and radiation.Status post G-tube placement but patient was also taking fluid from mouth. -Now Diet advanced to Soft Diet by General Surgery   Acute Kidney Injury: -Kidney function stable and Improved; BUN/Cr now 14/0.61 -IV fluids with NS at 75 mL/hr now D/C'd   -Avoid Nephrotoxins -Repeat CMP in AM   Hypokalemia:  -Improved. K+ went from 3.3 -> 3.8 -Continue to Monitor and Replete as Necessary -Repeat CMP in AM  Normocytic Anemia -Patient's Hb/Hct went from 15.5/46.4 -> 12.9/41.0 -> 13.2/40.3 -Continue to Monitor for S/Sx for Bleeding -Repeat CBC in AM   DVT prophylaxis: Warfarin Restarted Code Status: FULL CODE Family Communication: No family present at bedside  Disposition Plan: Anticipate D/C within next 24 hours if tolerating Diet without issue  Consultants:   General Surgery   Procedures: None   Antimicrobials:  Anti-infectives (From admission, onward)   None     Subjective: Seen and examined and felt improved. Passing Flatus but has not had a bowel movement. No CP or SOB. No Abdominal Pain. No other concerns or complaints at this time.   Objective: Vitals:   11/16/17 2158 11/17/17 0439 11/17/17 1032 11/17/17 1400  BP: 118/86 120/79 128/90 (!) 144/86  Pulse: 90 (!) 104 88 86  Resp: 20 20  20  Temp: 98.4 F (36.9 C) 98 F (36.7 C)  97.8 F (36.6 C)  TempSrc: Oral Oral  Oral  SpO2: 97% 97%  96%  Weight:  100.3 kg (221 lb 1.9 oz)    Height:        Intake/Output Summary (Last 24 hours) at 11/17/2017 2044 Last data filed at 11/17/2017 0600 Gross per 24 hour  Intake 751.5 ml  Output -  Net 751.5 ml   Filed Weights   11/15/17 0402  11/16/17 0443 11/17/17 0439  Weight: 96.3 kg (212 lb 4.9 oz) 97.7 kg (215 lb 6.2 oz) 100.3 kg (221 lb 1.9 oz)   Examination: Physical Exam:  Constitutional: WN/WD Caucasian Male in NAD; Appears calm and comfortable.  Eyes: Sclerae anicteric. Lids normal ENMT: External Ears and Nose appear normal. MMM Neck: Supple with no JVD; Has a scar from prior surgery  Respiratory: CTAB; No appreciable wheezing/rales/rhonchi; Unlabored breathing  Cardiovascular: Irregularly Irregular but rate is normal. No appreciable LE edema Abdomen: Soft, NT, ND; Has a G Tube connected to drain and Umbilcal hernia noted GU: Deferred Musculoskeletal: No contractures; No cyanosis Skin: Warm and Dry; No appreciable rashes or lesions on a limited skin eval Neurologic: CN 2-12 grossly intact. No appreciable focal deficits Psychiatric: Normal mood and affect. Intact judgement and insight  Data Reviewed: I have personally reviewed following labs and imaging studies  CBC: Recent Labs  Lab 11/12/17 2005 11/13/17 0134 11/16/17 0435 11/17/17 0250  WBC 4.1 3.6* 4.0 5.0  NEUTROABS  --   --  3.2 4.2  HGB 16.1 15.5 12.9* 13.2  HCT 47.5 46.4 41.0 40.3  MCV 93.5 94.5 97.9 95.0  PLT 235 210 169 178   Basic Metabolic Panel: Recent Labs  Lab 11/13/17 0134 11/14/17 0501 11/15/17 0504 11/16/17 0435 11/17/17 0250  NA 146* 142 143 143 142  K 3.4* 3.5 3.3* 3.7 3.8  CL 97* 103 105 108 110  CO2 35* 30 29 27 26   GLUCOSE 155* 152* 114* 110* 137*  BUN 37* 30* 23* 19 14  CREATININE 1.12 0.90 0.71 0.65 0.61  CALCIUM 9.5 8.4* 8.3* 8.4* 8.3*  MG 1.8  --   --   --  1.9  PHOS  --   --   --   --  2.2*   GFR: Estimated Creatinine Clearance: 96.3 mL/min (by C-G formula based on SCr of 0.61 mg/dL). Liver Function Tests: Recent Labs  Lab 11/12/17 2005 11/13/17 0134 11/17/17 0250  AST 34 28 18  ALT 57 51 22  ALKPHOS 149* 139* 82  BILITOT 0.8 0.8 0.9  PROT 8.3* 7.7 5.8*  ALBUMIN 4.2 4.2 2.8*   Recent Labs  Lab  11/12/17 2005  LIPASE 27   No results for input(s): AMMONIA in the last 168 hours. Coagulation Profile: Recent Labs  Lab 11/13/17 0134 11/14/17 0501 11/15/17 0504 11/16/17 0435 11/17/17 0250  INR 3.98 5.82* 1.63 1.31 1.34   Cardiac Enzymes: No results for input(s): CKTOTAL, CKMB, CKMBINDEX, TROPONINI in the last 168 hours. BNP (last 3 results) Recent Labs    04/25/17 1606 06/15/17 1136 11/10/17 1201  PROBNP 676* 1,015* 1,116*   HbA1C: No results for input(s): HGBA1C in the last 72 hours. CBG: Recent Labs  Lab 11/13/17 0759 11/14/17 0744 11/15/17 0741 11/16/17 0725  GLUCAP 138* 145* 116* 104*   Lipid Profile: No results for input(s): CHOL, HDL, LDLCALC, TRIG, CHOLHDL, LDLDIRECT in the last 72 hours. Thyroid Function Tests: No results for input(s): TSH, T4TOTAL, FREET4, T3FREE, THYROIDAB in  the last 72 hours. Anemia Panel: No results for input(s): VITAMINB12, FOLATE, FERRITIN, TIBC, IRON, RETICCTPCT in the last 72 hours. Sepsis Labs: Recent Labs  Lab 11/12/17 2114 11/13/17 0134  LATICACIDVEN 2.16* 1.4    No results found for this or any previous visit (from the past 240 hour(s)).   Radiology Studies: No results found. Scheduled Meds: . carvedilol  25 mg Oral BID WC  . digoxin  0.125 mg Oral Daily  . diltiazem  90 mg Oral Daily  . feeding supplement (OSMOLITE 1.5 CAL)  355 mL Per Tube 2 times per day  . [START ON 11/18/2017] feeding supplement (OSMOLITE 1.5 CAL)  474 mL Per Tube 2 times per day  . rosuvastatin  10 mg Oral Daily  . sodium chloride flush  3 mL Intravenous Q12H  . Warfarin - Pharmacist Dosing Inpatient   Does not apply q1800   Continuous Infusions: . famotidine (PEPCID) IV Stopped (11/17/17 1104)    LOS: 5 days   Merlene Laughter, DO [facility] Pager [phone]  If 7PM-7AM, please contact night-coverage [email or web address] Password [facility] 11/17/2017, 8:44 PM

## 2017-11-17 NOTE — Progress Notes (Signed)
ANTICOAGULATION CONSULT NOTE - Follow Up Consult  Pharmacy Consult for Warfarin Indication: atrial fibrillation  Allergies  Allergen Reactions  . Niaspan [Niacin Er] Other (See Comments)    REACTION NOT AN ALLERGY >  FLUSHED, EXPECTED W/NIACIN    Patient Measurements: Height: 6' (182.9 cm) Weight: 221 lb 1.9 oz (100.3 kg) IBW/kg (Calculated) : 77.6 Heparin Dosing Weight:   Vital Signs: Temp: 97.8 F (36.6 C) (03/14 1400) Temp Source: Oral (03/14 1400) BP: 144/86 (03/14 1400) Pulse Rate: 86 (03/14 1400)  Labs: Recent Labs    11/15/17 0504 11/16/17 0435 11/17/17 0250  HGB  --  12.9* 13.2  HCT  --  41.0 40.3  PLT  --  169 178  LABPROT 19.2* 16.2* 16.5*  INR 1.63 1.31 1.34  HEPARINUNFRC  --   --  0.32  CREATININE 0.71 0.65 0.61    Estimated Creatinine Clearance: 96.3 mL/min (by C-G formula based on SCr of 0.61 mg/dL).   Medications:  Infusions:  . famotidine (PEPCID) IV Stopped (11/17/17 1104)    Assessment: Pt admitted with elevated INR.  Vitamin K 5 mg IV was given on 3/11. Ok to resume warfarin today per TRH.   Today,11/17/17  Hgb 13.2 plt 1789  No bleeding noted  INR is 1.34 today   Goal of Therapy:  Heparin level 0.3-0.7 units/ml Monitor platelets by anticoagulation protocol: Yes   Plan:   Warfarin 7.5 PO x1   Daily INR  Monitor for signs and symptoms of bleeding    Royetta Asal, PharmD, BCPS Pager [phone] 11/17/2017 2:32 PM

## 2017-11-17 NOTE — Progress Notes (Signed)
[facility]/Trauma Progress Note      Assessment/Plan Principal Problem:   SBO (small bowel obstruction) (HCC) Active Problems:   PAF (paroxysmal atrial fibrillation) (HCC)   Chronic diastolic CHF (congestive heart failure) (HCC)   Malignant neoplasm of base of tongue (HCC)   AKI (acute kidney injury) ([place])   Supratherapeutic INR  SBO - having flatus, No nausea or vomiting, tolerating FLD - abd xray 03/12 showed no evidence of SBO  FEN: soft diet as per pt he eats normal food not pureed  VTE: SCD's, per medicine ID: none Follow up: TBD  DISPO: having flatus Advance diet to soft. Okay for discharge from a surgical standpoint if he tolerates a soft diet    LOS: 5 days    Subjective: CC: SBO  Tolerating FLD. No nausea or vomiting. Still having copious flatus. No new complaints.   Objective: Vital signs in last 24 hours: Temp:  [97.6 F (36.4 C)-98.4 F (36.9 C)] 98 F (36.7 C) (03/14 0439) Pulse Rate:  [89-104] 104 (03/14 0439) Resp:  [20] 20 (03/14 0439) BP: (118-134)/(79-86) 120/79 (03/14 0439) SpO2:  [97 %-98 %] 97 % (03/14 0439) Weight:  [221 lb 1.9 oz (100.3 kg)] 221 lb 1.9 oz (100.3 kg) (03/14 0439) Last BM Date: 11/15/17  Intake/Output from previous day: 03/13 0701 - 03/14 0700 In: 2077.3 [P.O.:240; I.V.:1737.3; IV Piggyback:100] Out: -  Intake/Output this shift: No intake/output data recorded.  PE: Gen:  Alert, NAD, pleasant, cooperative Pulm:  Rate and effort normal Abd: Soft, NT/ND, +BS, G tube in place with scant drainage, umbilical hernia soft and without pain Skin: no rashes noted, warm and dry  Anti-infectives: Anti-infectives (From admission, onward)   None      Lab Results:  Recent Labs    11/16/17 0435 11/17/17 0250  WBC 4.0 5.0  HGB 12.9* 13.2  HCT 41.0 40.3  PLT 169 178   BMET Recent Labs    11/16/17 0435 11/17/17 0250  NA 143 142  K 3.7 3.8  CL 108 110  CO2 27 26  GLUCOSE 110* 137*  BUN 19 14   CREATININE 0.65 0.61  CALCIUM 8.4* 8.3*   PT/INR Recent Labs    11/16/17 0435 11/17/17 0250  LABPROT 16.2* 16.5*  INR 1.31 1.34   CMP     Component Value Date/Time   NA 142 11/17/2017 0250   NA 143 11/10/2017 1201   NA 138 01/17/2017 1125   K 3.8 11/17/2017 0250   K 4.4 01/17/2017 1125   CL 110 11/17/2017 0250   CO2 26 11/17/2017 0250   CO2 29 01/17/2017 1125   GLUCOSE 137 (H) 11/17/2017 0250   GLUCOSE 112 01/17/2017 1125   BUN 14 11/17/2017 0250   BUN 20 11/10/2017 1201   BUN 22.3 09/01/2017 1300   CREATININE 0.61 11/17/2017 0250   CREATININE 0.8 09/01/2017 1300   CALCIUM 8.3 (L) 11/17/2017 0250   CALCIUM 9.8 01/17/2017 1125   PROT 5.8 (L) 11/17/2017 0250   PROT 6.8 03/11/2017 1258   PROT 7.9 12/21/2016 1423   ALBUMIN 2.8 (L) 11/17/2017 0250   ALBUMIN 3.8 03/11/2017 1258   ALBUMIN 3.4 (L) 12/21/2016 1423   AST 18 11/17/2017 0250   AST 19 12/21/2016 1423   ALT 22 11/17/2017 0250   ALT 23 12/21/2016 1423   ALKPHOS 82 11/17/2017 0250   ALKPHOS 119 12/21/2016 1423   BILITOT 0.9 11/17/2017 0250   BILITOT 0.4 03/11/2017 1258   BILITOT 0.80 12/21/2016 1423   GFRNONAA >  60 11/17/2017 0250   GFRAA >60 11/17/2017 0250   Lipase     Component Value Date/Time   LIPASE 27 11/12/2017 2005    Studies/Results: Dg Abd 2 Views  Result Date: 11/15/2017 CLINICAL DATA:  Follow-up small bowel obstruction EXAM: ABDOMEN - 2 VIEW COMPARISON:  11/14/2017 FINDINGS: There is gaseous distention of small bowel and colon. There is oral contrast material which has transited into the colon. There is no bowel dilatation to suggest obstruction. There is no evidence of pneumoperitoneum, portal venous gas or pneumatosis. There are no pathologic calcifications along the expected course of the ureters. The osseous structures are unremarkable. IMPRESSION: No evidence of bowel obstruction. Electronically Signed   By: Kathreen Devoid   On: 11/15/2017 10:49      Kalman Drape , [facility] 11/17/2017, 10:03 AM  Pager: [phone] Mon-Wed, Friday 7:00am-4:30pm Thurs 7am-11:30am  Consults: [phone]

## 2017-11-17 NOTE — Evaluation (Signed)
Physical Therapy Evaluation Patient Details Name: Collin [name] MRN: [id number] DOB: [birth year] Today's Date: 11/17/2017   History of Present Illness  Patient is a 77 year old male with past medical history of  A. 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  Patient reported not having a bowel movement for more than 3 days. Patient was found to have small bowel obstruction.  History of small bowel obstruction in the past. Started on conservative management with drain, n.p.o. status , IV fluids and pain medications. He is slowly improving. He is on a full liquid diet.   Clinical Impression  Patient performed well. He was able to stand without AD and min guard while conversing with therapist and using his hands/arms for expression. He ambulated in hospital room without AD. Ambulated 700 feet in hallways with RW for balance, not for weight bearing. Patient does exhibit generalized weakness, mild balance and coordination deficits and would benefit from ambulation with nursing. PT did discuss possible need for RW at home. Patient did not feel he would need RW and remarked he really only used it in hospital to be safe. Patient does not require skilled physical therapy at this time but would benefit from ambulation with nursing as able. Patient is DC'ed from PT at this time.     Follow Up Recommendations No PT follow up;Supervision for mobility/OOB    Equipment Recommendations       Recommendations for Other Services       Precautions / Restrictions Precautions Precautions: Fall Restrictions Weight Bearing Restrictions: No      Mobility  Bed Mobility Overal bed mobility: Modified Independent             General bed mobility comments:  somewhat increased time.  Transfers Overall transfer level: Modified independent Equipment used: Rolling walker (2 wheeled)             General transfer comment: somewhat increased time.  Ambulation/Gait Ambulation/Gait assistance: Modified independent (Device/Increase time) Ambulation Distance (Feet): 700 Feet Assistive device: Rolling walker (2 wheeled) Gait Pattern/deviations: Step-through pattern;Decreased stride length;Decreased step length - right;Decreased step length - left   Gait velocity interpretation: Below normal speed for age/gender General Gait Details: patient utilized RW for balance > weight bearing; able to stand w/o AD several times to express conversation with hands.  Stairs            Wheelchair Mobility    Modified Rankin (Stroke Patients Only)       Balance Overall balance assessment: Needs assistance Sitting-balance support: No upper extremity supported Sitting balance-Leahy Scale: Good     Standing balance support: No upper extremity supported Standing balance-Leahy Scale: Fair                               Pertinent Vitals/Pain Pain Assessment: No/denies pain    Home Living Family/patient expects to be discharged to:: Private residence Living Arrangements: Spouse/significant other   Type of Home: House Home Access: Stairs to enter Entrance Stairs-Rails: Lawyer of Steps: 4 Home Layout: Multi-level;Laundry or work area in basement;Bed/bath upstairs   Additional Comments: walking stick    Prior Function Level of Independence: Independent         Comments: physician recently recommended he limit his driving for  the time being.     Hand Dominance        Extremity/Trunk Assessment   Upper Extremity Assessment Upper Extremity Assessment: Generalized weakness;Overall [facility] for tasks assessed    Lower Extremity Assessment Lower Extremity Assessment: Generalized weakness;Overall [facility]  for tasks assessed    Cervical / Trunk Assessment Cervical / Trunk Assessment: Kyphotic  Communication   Communication: No difficulties  Cognition Arousal/Alertness: Awake/alert Behavior During Therapy: WFL for tasks assessed/performed Overall Cognitive Status: Within Functional Limits for tasks assessed                                        General Comments      Exercises General Exercises - Lower Extremity Long Arc Quad: Seated;Strengthening;AROM;Both;10 reps Hip Flexion/Marching: Seated;AROM;Strengthening;Both;10 reps Toe Raises: Seated;AROM;Strengthening;Both;10 reps Heel Raises: Seated;AROM;Strengthening;Both;10 reps   Assessment/Plan    PT Assessment Patent does not need any further PT services  PT Problem List         PT Treatment Interventions      PT Goals (Current goals can be found in the Care Plan section)  Acute Rehab PT Goals Patient Stated Goal: return home. PT Goal Formulation: All assessment and education complete, DC therapy Time For Goal Achievement: 11/24/17 Potential to Achieve Goals: Good    Frequency     Barriers to discharge        Co-evaluation               AM-PAC PT "6 Clicks" Daily Activity  Outcome Measure Difficulty turning over in bed (including adjusting bedclothes, sheets and blankets)?: None Difficulty moving from lying on back to sitting on the side of the bed? : None Difficulty sitting down on and standing up from a chair with arms (e.g., wheelchair, bedside commode, etc,.)?: None Help needed moving to and from a bed to chair (including a wheelchair)?: None Help needed walking in hospital room?: A Little Help needed climbing 3-5 steps with a railing? : A Little 6 Click Score: 22    End of Session   Activity Tolerance: Patient tolerated treatment well Patient left: in bed;with call bell/phone within reach;with bed alarm set Nurse Communication: Mobility status PT Visit Diagnosis: Unsteadiness on  feet (R26.81);History of falling (Z91.81);Muscle weakness (generalized) (M62.81);Other abnormalities of gait and mobility (R26.89)    Time: 1131-1202 PT Time Calculation (min) (ACUTE ONLY): 31 min   Charges:   PT Evaluation $PT Eval Moderate Complexity: 1 Mod PT Treatments $Therapeutic Activity: 8-22 mins   PT G Codes:        Marji Kuehnel D. Hartnett-Rands, MS, PT Per Diem PT [facility] #[id number] 11/17/2017, 12:25 PM

## 2017-11-17 NOTE — Progress Notes (Signed)
OT Cancellation Note  Patient Details Name: [name] MRN: [id number] DOB: 1940/09/27   Cancelled Treatment:    Reason Eval/Treat Not Completed: OT screened, no needs identified, will sign off   Spoke to pt:  No OT needs at this time. Will sign off. Lavella Myren 11/17/2017, 1:50 PM  Lesle Chris, OTR/L [phone] 11/17/2017

## 2017-11-18 DIAGNOSIS — E440 Moderate protein-calorie malnutrition: Secondary | ICD-10-CM

## 2017-11-18 LAB — PROTIME-INR
INR: 1.17
Prothrombin Time: 14.8 seconds (ref 11.4–15.2)

## 2017-11-18 LAB — COMPREHENSIVE METABOLIC PANEL
ALT: 23 U/L (ref 17–63)
AST: 19 U/L (ref 15–41)
Albumin: 2.9 g/dL — ABNORMAL LOW (ref 3.5–5.0)
Alkaline Phosphatase: 83 U/L (ref 38–126)
Anion gap: 7 (ref 5–15)
BUN: 11 mg/dL (ref 6–20)
CO2: 26 mmol/L (ref 22–32)
Calcium: 8.6 mg/dL — ABNORMAL LOW (ref 8.9–10.3)
Chloride: 109 mmol/L (ref 101–111)
Creatinine, Ser: 0.57 mg/dL — ABNORMAL LOW (ref 0.61–1.24)
GFR calc Af Amer: >60 mL/min (ref >60)
GFR calc non Af Amer: >60 mL/min (ref >60)
Glucose, Bld: 114 mg/dL — ABNORMAL HIGH (ref 65–99)
Potassium: 4 mmol/L (ref 3.5–5.1)
Sodium: 142 mmol/L (ref 135–145)
Total Bilirubin: 0.6 mg/dL (ref 0.3–1.2)
Total Protein: 5.9 g/dL — ABNORMAL LOW (ref 6.5–8.1)

## 2017-11-18 LAB — CBC WITH DIFFERENTIAL/PLATELET
Basophils Absolute: 0 10*3/uL (ref 0.0–0.1)
Basophils Relative: 0 %
Eosinophils Absolute: 0.1 10*3/uL (ref 0.0–0.7)
Eosinophils Relative: 2 %
HCT: 40.1 % (ref 39.0–52.0)
Hemoglobin: 13.1 g/dL (ref 13.0–17.0)
Lymphocytes Relative: 11 %
Lymphs Abs: 0.5 10*3/uL — ABNORMAL LOW (ref 0.7–4.0)
MCH: 31 pg (ref 26.0–34.0)
MCHC: 32.7 g/dL (ref 30.0–36.0)
MCV: 94.8 fL (ref 78.0–100.0)
Monocytes Absolute: 0.2 10*3/uL (ref 0.1–1.0)
Monocytes Relative: 4 %
Neutro Abs: 3.5 10*3/uL (ref 1.7–7.7)
Neutrophils Relative %: 83 %
Platelets: 168 10*3/uL (ref 150–400)
RBC: 4.23 MIL/uL (ref 4.22–5.81)
RDW: 14.2 % (ref 11.5–15.5)
WBC: 4.2 10*3/uL (ref 4.0–10.5)

## 2017-11-18 LAB — GLUCOSE, CAPILLARY: Glucose-Capillary: 95 mg/dL (ref 65–99)

## 2017-11-18 LAB — PHOSPHORUS: Phosphorus: 3.1 mg/dL (ref 2.5–4.6)

## 2017-11-18 LAB — MAGNESIUM: Magnesium: 2 mg/dL (ref 1.7–2.4)

## 2017-11-18 MED ORDER — WARFARIN SODIUM 7.5 MG PO TABS
7.5000 mg | ORAL_TABLET | Freq: Once | ORAL | Status: AC
  Administered 2017-11-18: 7.5 mg via ORAL
  Filled 2017-11-18: qty 1

## 2017-11-18 MED ORDER — WARFARIN SODIUM 5 MG PO TABS: 5.0000 mg | ORAL_TABLET | ORAL | Status: DC

## 2017-11-18 NOTE — Progress Notes (Signed)
ANTICOAGULATION CONSULT NOTE - Follow Up Consult  Pharmacy Consult for Warfarin Indication: atrial fibrillation  Allergies  Allergen Reactions  . Niaspan [Niacin Er] Other (See Comments)    REACTION NOT AN ALLERGY >  FLUSHED, EXPECTED W/NIACIN    Patient Measurements: Height: 6' (182.9 cm) Weight: 219 lb 5.7 oz (99.5 kg) IBW/kg (Calculated) : 77.6 Heparin Dosing Weight:   Vital Signs: Temp: 98.3 F (36.8 C) (03/15 0611) Temp Source: Oral (03/15 0611) BP: 111/66 (03/15 0859) Pulse Rate: 82 (03/15 0859)  Labs: Recent Labs    11/16/17 0435 11/17/17 0250 11/18/17 0430  HGB 12.9* 13.2 13.1  HCT 41.0 40.3 40.1  PLT 169 178 168  LABPROT 16.2* 16.5* 14.8  INR 1.31 1.34 1.17  HEPARINUNFRC  --  0.32  --   CREATININE 0.65 0.61 0.57*    Estimated Creatinine Clearance: 96 mL/min (A) (by C-G formula based on SCr of 0.57 mg/dL (L)).   Medications:  Infusions:  . famotidine (PEPCID) IV Stopped (11/17/17 2210)    Assessment: Pt admitted with elevated INR.  Vitamin K 5 mg IV was given on 3/11. Ok to resume warfarin today per TRH.   Today,11/18/17  Hgb 13.1 plt 1168  No bleeding noted  INR is 1.17 today, lower due to recent Vit K administration  Goal of Therapy:  Heparin level 0.3-0.7 units/ml Monitor platelets by anticoagulation protocol: Yes   Plan:   Warfarin 7.5 PO x1   Daily INR  Monitor for signs and symptoms of bleeding    Royetta Asal, PharmD, BCPS Pager [phone] 11/18/2017 12:19 PM

## 2017-11-18 NOTE — Progress Notes (Signed)
Initial Nutrition Assessment  DOCUMENTATION CODES:   Non-severe (moderate) malnutrition in context of chronic illness  INTERVENTION:   - Current TF regimen of 474 mL (2 cans) Osmolite 1.5 BID and 355 mL (1.5 cans) Osmolite 1.5 BID (1658 total mL/day) provides 2487 kcal/day (100% estimated kcal needs), 104 grams protein/day (87% estimated protein needs), and 1260 mL free water daily.  Current TF regimen with free water flushes of 60 mL before and after feedings provides 1740 mL free water daily.  - Encourage PO intake.  NUTRITION DIAGNOSIS:   Moderate Malnutrition related to chronic illness(CHF, cancer and cancer-related treatments) as evidenced by mild fat depletion, mild muscle depletion.  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 Pt presented to ED following 3 days of abdominal discomfort, distension, nausea, and vomiting. Pt has recently started eating again and is considering removal of PEG. Pt admitted for SBO.  11/16/17 - diet advanced to full liquid diet, NG tube removed 11/17/17 - diet advanced to soft, TF ordered  Spoke with pt at bedside who reports he will be "going home today." Pt states that he still has some difficulty chewing and swallowing due to scar tissue and mainly consumes small bites of softer, easy-to-chew foods. Pt also drinks fluids while eating. Pt consumes food orally in addition to home tube feeding regimen. Pt reports having a few bites of breakfast this morning (scrambled eggs and bacon).  Per pt, home tube feeding regimen is 2 cans of Osmolite 1.5 TID. Pt states that he occasionally mixes the Osmolite cans with chocolate and consumes them orally. Home tube feeding regimen (1422 mL  daily) provides 2133 kcal/day (93 estimated energy needs), 89 grams protein/day, (74 estimated energy needs), and 1081 mL free water. Pt reports that recently he has been sent "a different formula" but is unsure what it is. TF ordered yesterday (see intervention above).  Pt reports his UBW as 250-260 lbs and that he last weighed this a couple of years ago. Per weight history in chart, pt has experienced a 6.3% weight loss in 5 months which is not significant for timeframe. Pt reports losing over 60 lbs in 2 years. Unable to confirm weight loss in chart.  I/O: +4.6 L since admission  Medications reviewed and include: 20 mg Pepcid BID  Labs reviewed: creatinine 0.57, phosphorus and magnesium WNL CBG: 95 this AM  NUTRITION - FOCUSED PHYSICAL EXAM:    Most Recent Value  Orbital Region  Moderate depletion  Upper Arm Region  Mild depletion  Thoracic and Lumbar Region  No depletion  Buccal Region  No depletion  Temple Region  Mild depletion  Clavicle Bone Region  No depletion  Clavicle and Acromion Bone Region  No depletion  Scapular Bone Region  No depletion  Dorsal Hand  No depletion  Patellar Region  Mild depletion  Anterior Thigh Region  No depletion  Posterior Calf Region  Mild depletion  Edema (RD Assessment)  Mild  Hair  Reviewed  Eyes  Reviewed  Mouth  Reviewed  Skin  Reviewed  Nails  Reviewed      Diet Order:  DIET SOFT Room service appropriate? Yes; Fluid consistency: Thin  EDUCATION NEEDS:   No  education needs have been identified at this time  Skin:  Skin Assessment: Reviewed RN Assessment(ecchymosis to R and L legs)  Last BM:  11/17/17 small type 7  Height:   Ht Readings from Last 1 Encounters:  11/13/17 6' (1.829 m)    Weight:   Wt Readings from Last 1 Encounters:  11/18/17 219 lb 5.7 oz (99.5 kg)    Ideal Body Weight:  80.9 kg  BMI:  Body mass index is 29.75 kg/m.  Estimated Nutritional Needs:   Kcal:  2300-2500 kcal/day  Protein:  120-135  grams/day  Fluid:  2.0 L/day    Gaynell Face, MS, RD, LDN Pager: [phone] Weekend/After Hours: [phone]

## 2017-11-18 NOTE — Discharge Summary (Signed)
Physician Discharge Summary  Collin [name] ION:[id number] DOB: [birth year] DOA: 11/12/2017  PCP: [name], MD  Admit date: 11/12/2017 Discharge date: 11/18/2017  Admitted From: Home Disposition: Home  Recommendations for Outpatient Follow-up:  1. Follow up with PCP in 1-2 weeks 2. Follow up with General Surgery as an outpatient] 3. Follow up with Cardiology within 1-2 weeks 4. Repeat PT-INR in 3 days 5. Please obtain CMP/CBC, Mag, Phos in one week 6. Please follow up on the following pending results:  Home Health: No Equipment/Devices: None  Discharge Condition: Stable CODE STATUS: FULL CODE Diet recommendation: Heart Healthy Soft Diet  Brief/Interim Summary: Patient is a 77 year old male with past medical history of A. 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 Patient reported not having a bowel movement for more than 3 days. Patient was found to have small bowel obstruction. History of small bowel obstruction in the past. Started on conservative management with drain, n.p.o. status , IV fluids and pain medications. He is slowly improving and General Surgery advanced diet to Soft Diet today. He improved and was deemed stable to D/C. Coumadin was resumed and patient was instructed to take 7.5 mg of Coumadin Daily x3 days and have INR repeated. He will need to follow up with PCP, General Surgery, and Cardiology at D/C.  Discharge Diagnoses:  Principal Problem:   SBO (small bowel obstruction) (HCC) Active Problems:   PAF (paroxysmal atrial fibrillation) (HCC)   Chronic diastolic CHF (congestive heart failure) (HCC)   Malignant neoplasm of base of tongue (HCC)   AKI (acute kidney injury) (HCC)   Supratherapeutic INR    Malnutrition of moderate degree  Small Bowel Obstruction, Improved  -Continued conservative management.  -General Surgery following and appreciate Recc's.  -Abdomen pain is better and is passing flatus -He has history of small bowel obstruction in the past. -CT findings were consistent with SBO due to adhesions. -Gentle IV fluids D/C'd.  -Continue analgesics and antiemetics.  -Drain was attached to the G-tube and clamped -Diet being advanced by General Surgery and patient now on Soft Diet  -Follow up with PCP and General Surgery as an outpatient   Atrial Fibrillation -Currently heart rate is controlled. IV Diltiazem 10 mg q4h changed to po 90 mg po Daily and restarted home Digoxin 0.125 mg po Daily -Resumed Home Carvedilol yesterday AM  -Was on Warfarin at home and will resume -INR wassupratherapeutic but subtherapeutic today at 1.17 -Continue daily monitoring of INR.  -Wasgiven 5 mg of Vitamin K IV3/11/19 -Heparin gtt started yesterday but will now D/C and transition to po Coumadin -Recommended 7.5 mg of Coumadin daily x3 and then resuming home dose and repeating INR in 3 days -Follow up with Cardiology as an outpatient   Chronic Diastolic CHF: -On Torsemide at home.  -IVF now D/C'd  -Resumed Carvedilol 25 mg po BID  -IVF D/C'd -Continue to Monitor Volume Status   Tongue Cancer:  -Status post resection and radiation.Status post G-tube placement but patientwasalso taking fluid from mouth. -Now Diet advanced to Soft Diet by General Surgery -Follow up with Oncology/Radiation Oncology    Acute Kidney Injury: -Kidney function stable and Improved; BUN/Cr now 11/0.57 -IV fluids with NS at 75 mL/hr now D/C'd  -Avoid Nephrotoxins -Repeat CMP as an outpatient   Hypokalemia: -Improved. K+ went from  3.3 -> 3.8 -> 4.0 -Continue to Monitor and Replete as Necessary -Repeat CMP as an outpatient   Normocytic Anemia -Patient's Hb/Hct went from 15.5/46.4 ->  12.9/41.0 -> 13.2/40.3 -> 13.1/40.1 -Continue to Monitor for S/Sx for Bleeding -Repeat CBC as an outpatient   Malnutrition of Moderate Degree -Appreciated Nutritionist evaluation -C/w Supplments   Discharge Instructions  Discharge Instructions    (HEART FAILURE PATIENTS) Call MD:  Anytime you have any of the following symptoms: 1) 3 pound weight gain in 24 hours or 5 pounds in 1 week 2) shortness of breath, with or without a dry hacking cough 3) swelling in the hands, feet or stomach 4) if you have to sleep on extra pillows at night in order to breathe.   Complete by:  As directed    Call MD for:  difficulty breathing, headache or visual disturbances   Complete by:  As directed    Call MD for:  extreme fatigue   Complete by:  As directed    Call MD for:  hives   Complete by:  As directed    Call MD for:  persistant dizziness or light-headedness   Complete by:  As directed    Call MD for:  persistant nausea and vomiting   Complete by:  As directed    Call MD for:  redness, tenderness, or signs of infection (pain, swelling, redness, odor or green/yellow discharge around incision site)   Complete by:  As directed    Call MD for:  severe uncontrolled pain   Complete by:  As directed    Call MD for:  temperature >100.4   Complete by:  As directed    Diet - low sodium heart healthy   Complete by:  As directed    Soft Diet as Tolerated   Discharge instructions   Complete by:  As directed    Follow up with PCP, Cardiology and General Surgery as an outpatient. Take all medications as prescribed. If symptoms change or worsen please return to the ED for evaluation.   Increase activity slowly   Complete by:  As directed      Allergies as of 11/18/2017      Reactions   Niaspan [niacin Er] Other (See Comments)   REACTION NOT AN ALLERGY >  FLUSHED, EXPECTED W/NIACIN      Medication List    TAKE these medications   acetaminophen 500 MG tablet Commonly known as:  TYLENOL Take 1,000 mg  by mouth every 6 (six) hours as needed for moderate pain.   carvedilol 25 MG tablet Commonly known as:  COREG Take 25 mg by mouth twice daily   digoxin 0.125 MG tablet Commonly known as:  LANOXIN Take 1 tablet (0.125 mg total) by mouth daily. What changed:  how much to take   diltiazem 90 MG tablet Commonly known as:  CARDIZEM Take 1 tablet (90 mg total) by mouth daily.   feeding supplement (OSMOLITE 1.5 CAL) Liqd Give 2 cans of formula at 8am and 12 noon. Give additional 1 1/2 cans at 4pm and 8pm feeding.   Flush with 60ml of water before and after feeding 4 times per day What changed:    how much to take  how to take this  when to take this  additional instructions   PROMOD Liqd Give 30ml (1 oz) 2 times per day via tube. Flush with 60ml of water before and after given. What changed:  Another medication with the same name was changed. Make  sure you understand how and when to take each.   polyethylene glycol packet Commonly known as:  MIRALAX Take 17 g by mouth daily as needed. Available OTC What changed:    reasons to take this  additional instructions   potassium chloride 10 MEQ tablet Commonly known as:  K-DUR Dissolve 1 tablet in water and administer via PEG tube daily What changed:    how much to take  how to take this  when to take this  additional instructions   rosuvastatin 10 MG tablet Commonly known as:  CRESTOR Take 1 tablet (10 mg total) by mouth daily.   sodium fluoride 1.1 % Crea dental cream Commonly known as:  PREVIDENT 5000 PLUS Apply gel to tooth brush. Brush teeth for 2 minutes. Spit out excess-DO NOT swallow. Repeat nightly. What changed:    how much to take  how to take this  when to take this  additional instructions   torsemide 20 MG tablet Commonly known as:  DEMADEX Take 1 tablet (20 mg total) by mouth daily.   TUSSIN PO Take 10 mLs at bedtime as needed by mouth (congestion).   warfarin 5 MG tablet Commonly known  as:  COUMADIN Take as directed. If you are unsure how to take this medication, talk to your nurse or doctor. Original instructions:  Take 1-1.5 tablets (5-7.5 mg total) by mouth See admin instructions. TAKE AS DIRECTED BY ANTICOAGULATION CLINIC. Take 5 mg daily except Wednesday and Saturday take 7.5 mg normally  **For the next 3 days take 7.5 mg Daily and go back to regular schedule 3/18. Follow up with Coumadin Clinic Monday for INR Check What changed:    how much to take  how to take this  when to take this  additional instructions       Allergies  Allergen Reactions  . Niaspan [Niacin Er] Other (See Comments)    REACTION NOT AN ALLERGY >  FLUSHED, EXPECTED W/NIACIN   Consultations:  General Surgery  Procedures/Studies: Ct Abdomen Pelvis W Contrast  Result Date: 11/12/2017 CLINICAL DATA:  Vomiting black emesis for 3 days with swelling EXAM: CT ABDOMEN AND PELVIS WITH CONTRAST TECHNIQUE: Multidetector CT imaging of the abdomen and pelvis was performed using the standard protocol following bolus administration of intravenous contrast. CONTRAST:  ISOVUE-300 IOPAMIDOL (ISOVUE-300) INJECTION 61% COMPARISON:  Head CT 05/17/2017, CT abdomen pelvis 11/09/2016 FINDINGS: Lower chest: Lung bases demonstrate mild ground-glass density at the left base. No pleural effusion. Cardiomegaly with partially visualized cardiac pacing leads. Coronary calcification. Bilateral gynecomastia. Hepatobiliary: Subcentimeter hypodensity anterior dome of liver too small to characterize. Calcified stone in the gallbladder. No biliary dilatation. Pancreas: Unremarkable. No pancreatic ductal dilatation or surrounding inflammatory changes. Spleen: Normal in size without focal abnormality. Adrenals/Urinary Tract: Mild thickening of the adrenal glands without discrete mass. Cyst in the upper pole of the right kidney. No hydronephrosis. Thick-walled appearance of the urinary bladder. Stomach/Bowel: Dilated  fluid-filled stomach. Multiple loops of dilated small bowel measuring up to 4.7 cm. Collapsed distal small bowel with transition point in the anterior abdomen near the level of umbilical hernia, series 2, image number 66. No colon wall thickening. Normal appendix. Vascular/Lymphatic: Tortuous aorta with extensive calcification. No aneurysmal dilatation. 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 No significantly enlarged lymph nodes. Reproductive: Enlarged prostate gland with calcification Other: Negative for free air or free fluid. Periumbilical fat containing hernia with small amount of fluid in the hernia sac. Musculoskeletal: Degenerative changes of  the spine. No acute or suspicious lesion IMPRESSION: 1. Dilated stomach and small bowel, consistent with mechanical small bowel obstruction, transition point visualized within the anterior abdomen at the level of the patient's umbilical hernia, findings likely due to adhesion. 2. Thick-walled appearance of the urinary bladder, query cystitis versus chronic obstruction. Prostate is enlarged. 3. Calcified gallstones 4. Cardiomegaly. 5. Gynecomastia 6. Azygos continuation of the IVC. Electronically Signed   By: Jasmine Pang M.D.   On: 11/12/2017 22:16   Dg Abd 2 Views  Result Date: 11/15/2017 CLINICAL DATA:  Follow-up small bowel obstruction EXAM: ABDOMEN - 2 VIEW COMPARISON:  11/14/2017 FINDINGS: There is gaseous distention of small bowel and colon. There is oral contrast material which has transited into the colon. There is no bowel dilatation to suggest obstruction. There is no evidence of pneumoperitoneum, portal venous gas or pneumatosis. There are no pathologic calcifications along the expected course of the ureters. The osseous structures are unremarkable. IMPRESSION: No evidence of bowel obstruction. Electronically Signed   By: Elige Ko   On: 11/15/2017 10:49   Dg Abd Portable 1v-small Bowel Obstruction Protocol-initial,  8 Hr Delay  Result Date: 11/14/2017 CLINICAL DATA:  Small-bowel obstruction protocol. 8 hour delayed image. EXAM: PORTABLE ABDOMEN - 1 VIEW COMPARISON:  Abdominal radiograph performed earlier today at 8:28 a.m. FINDINGS: Residual contrast is noted within the stomach. Contrast is also seen within small bowel loops. The colon is difficult to fully characterize. There is distention of multiple small bowel loops. This appears mildly improved from the prior study, but is concerning for persistent partial small bowel obstruction. No acute osseous abnormalities are identified. No free intra-abdominal air is seen, though evaluation for free air is limited on a single supine view. IMPRESSION: Distention of multiple small bowel loops appears mildly improved from the prior study, but remains concerning for partial small bowel obstruction. Contrast noted within small bowel loops and the stomach. Electronically Signed   By: Roanna Raider M.D.   On: 11/14/2017 22:44   Dg Abd Portable 1v  Result Date: 11/14/2017 CLINICAL DATA:  Small bowel obstruction. EXAM: PORTABLE ABDOMEN - 1 VIEW COMPARISON:  Abdominal CT 11/12/2017 FINDINGS: Gas dilated loop of bowel in the left upper quadrant measuring up to 6 cm. Given adjacent transverse colonic segment containing stool this is likely progressively dilated small bowel. No interval evacuation of colon. Moderate gaseous distention of the stomach. Cholelithiasis. IMPRESSION: Gas dilated bowel in the left upper quadrant favoring small bowel over colon and compatible with ongoing partial small bowel obstruction. No interval evacuation of the colon. Electronically Signed   By: Marnee Spring M.D.   On: 11/14/2017 08:59   Subjective: Seen and examined at bedside and improved. No CP or SOB. Tolerating diet and no abdominal pain. Ready to go home.  Discharge Exam: Vitals:   11/18/17 1234 11/18/17 1309  BP:  113/73  Pulse: 89 85  Resp:  16  Temp:  98.4 F (36.9 C)  SpO2:  98%    Vitals:   11/18/17 0611 11/18/17 0859 11/18/17 1234 11/18/17 1309  BP: 109/76 111/66  113/73  Pulse: 83 82 89 85  Resp: 20   16  Temp: 98.3 F (36.8 C)   98.4 F (36.9 C)  TempSrc: Oral   Oral  SpO2: 95% 97%  98%  Weight: 99.5 kg (219 lb 5.7 oz)     Height:       General: Pt is an obese Caucasian male who is alert, awake, not in acute  distress Cardiovascular: Irregularly Irregular, S1/S2 +, no rubs, no gallops Respiratory: Diminished bilaterally, no wheezing, no rhonchi Abdominal: Soft, NT, ND, bowel sounds +; Has Umbilical hernia and PEG tube in place Extremities: no edema, no cyanosis  The results of significant diagnostics from this hospitalization (including imaging, microbiology, ancillary and laboratory) are listed below for reference.    Microbiology: No results found for this or any previous visit (from the past 240 hour(s)).   Labs: BNP (last 3 results) No results for input(s): BNP in the last 8760 hours. Basic Metabolic Panel: Recent Labs  Lab 11/17/17 0250 11/18/17 0430  NA 142 142  K 3.8 4.0  CL 110 109  CO2 26 26  GLUCOSE 137* 114*  BUN 14 11  CREATININE 0.61 0.57*  CALCIUM 8.3* 8.6*  MG 1.9 2.0  PHOS 2.2* 3.1   Liver Function Tests: Recent Labs  Lab 11/17/17 0250 11/18/17 0430  AST 18 19  ALT 22 23  ALKPHOS 82 83  BILITOT 0.9 0.6  PROT 5.8* 5.9*  ALBUMIN 2.8* 2.9*   No results for input(s): LIPASE, AMYLASE in the last 168 hours. No results for input(s): AMMONIA in the last 168 hours. CBC: Recent Labs  Lab 11/17/17 0250 11/18/17 0430  WBC 5.0 4.2  NEUTROABS 4.2 3.5  HGB 13.2 13.1  HCT 40.3 40.1  MCV 95.0 94.8  PLT 178 168   Cardiac Enzymes: No results for input(s): CKTOTAL, CKMB, CKMBINDEX, TROPONINI in the last 168 hours. BNP: Invalid input(s): POCBNP CBG: Recent Labs  Lab 11/18/17 0801  GLUCAP 95   D-Dimer No results for input(s): DDIMER in the last 72 hours. Hgb A1c No results for input(s): HGBA1C in the last 72  hours. Lipid Profile No results for input(s): CHOL, HDL, LDLCALC, TRIG, CHOLHDL, LDLDIRECT in the last 72 hours. Thyroid function studies No results for input(s): TSH, T4TOTAL, T3FREE, THYROIDAB in the last 72 hours.  Invalid input(s): FREET3 Anemia work up No results for input(s): VITAMINB12, FOLATE, FERRITIN, TIBC, IRON, RETICCTPCT in the last 72 hours. Urinalysis    Component Value Date/Time   COLORURINE AMBER (A) 11/12/2017 2006   APPEARANCEUR HAZY (A) 11/12/2017 2006   LABSPEC 1.024 11/12/2017 2006   PHURINE 5.0 11/12/2017 2006   GLUCOSEU NEGATIVE 11/12/2017 2006   HGBUR NEGATIVE 11/12/2017 2006   BILIRUBINUR SMALL (A) 11/12/2017 2006   KETONESUR 5 (A) 11/12/2017 2006   PROTEINUR 100 (A) 11/12/2017 2006   UROBILINOGEN 0.2 08/20/2011 1711   NITRITE NEGATIVE 11/12/2017 2006   LEUKOCYTESUR NEGATIVE 11/12/2017 2006   Sepsis Labs Invalid input(s): PROCALCITONIN,  WBC,  LACTICIDVEN Microbiology No results found for this or any previous visit (from the past 240 hour(s)).  Time coordinating discharge: 35 minutes  SIGNED:  Merlene Laughter, DO [facility] 11/23/2017, 3:29 PM Pager [phone]  If 7PM-7AM, please contact night-coverage [email or web address] Password [email or web address]

## 2017-11-21 ENCOUNTER — Ambulatory Visit (INDEPENDENT_AMBULATORY_CARE_PROVIDER_SITE_OTHER): Payer: Medicare HMO | Admitting: General Practice

## 2017-11-21 ENCOUNTER — Telehealth: Payer: Self-pay | Admitting: Family Medicine

## 2017-11-21 DIAGNOSIS — I480 Paroxysmal atrial fibrillation: Secondary | ICD-10-CM | POA: Diagnosis not present

## 2017-11-21 DIAGNOSIS — Z7901 Long term (current) use of anticoagulants: Secondary | ICD-10-CM

## 2017-11-21 LAB — POCT INR: INR: 1.4

## 2017-11-21 NOTE — Telephone Encounter (Signed)
Transition Care Management Follow-up Telephone Call  DAQUANE AGUILAR RZN:[id number] DOB: [birth year] DOA: 11/12/2017  PCP: Eulas Post, MD   Patient coming from: Home  Chief Complaint: Abd pain, distension, N/V   HPI: AMAHD MORINO is a 77 y.o. 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  Patient reports that he had been experiencing increased swelling involving the bilateral lower extremities, admitting to recent dietary indiscretions.  He had otherwise been well until noting some mid abdominal discomfort with nausea approximately 3 days ago.  He also noted some mild distention at that time.  Over the ensuing days, discomfort and nausea has worsened, and he has had numerous episodes of vomiting for the past day.  Vomitus is greenish brown liquid.  He has not moved his bowels in 3 days or more.  He has an umbilical hernia that is soft, nontender, and without discoloration.  He denies any recent fevers or chills    How have you been since you were released from the hospital? "better"   Do you understand why you were in the hospital? yes   Do you understand the discharge instructions? yes   Where were you discharged to? home   Items Reviewed:  Medications reviewed: yes  Allergies reviewed: yes  Dietary changes reviewed: yes  Referrals reviewed: yes   Functional Questionnaire:   Activities of Daily Living (ADLs):   He states they are independent in the following: ambulation, bathing and hygiene, feeding, continence, grooming, toileting and dressing States they require assistance with the following: ambulation, bathing and hygiene, feeding, continence, grooming, toileting and dressing   Any transportation issues/concerns?:  no   Any patient concerns? no   Confirmed importance and date/time of follow-up visits scheduled yes  Provider Appointment booked with Dr. Elease Hashimoto on 11/22/2017 Tuesday at 2:00 pm  Confirmed with patient if condition begins to worsen call PCP or go to the ER.  Patient was given the office number and encouraged to call back with question or concerns.  : yes

## 2017-11-21 NOTE — Patient Instructions (Addendum)
Pre visit review using our clinic review tool, if applicable. No additional management support is needed unless otherwise documented below in the visit note.  Take 7.5 mg today and tomorrow (3/18 and 3/19) and then continue to take 1 tablet daily except 1 1/2 Tuesday and Saturday.  Re-check in 2 weeks.

## 2017-11-22 ENCOUNTER — Ambulatory Visit (INDEPENDENT_AMBULATORY_CARE_PROVIDER_SITE_OTHER): Payer: Medicare HMO | Admitting: Family Medicine

## 2017-11-22 ENCOUNTER — Encounter: Payer: Self-pay | Admitting: Family Medicine

## 2017-11-22 VITALS — BP 110/78 | HR 70 | Temp 97.4°F | Wt 212.3 lb

## 2017-11-22 DIAGNOSIS — I5032 Chronic diastolic (congestive) heart failure: Secondary | ICD-10-CM

## 2017-11-22 DIAGNOSIS — E440 Moderate protein-calorie malnutrition: Secondary | ICD-10-CM

## 2017-11-22 DIAGNOSIS — C01 Malignant neoplasm of base of tongue: Secondary | ICD-10-CM | POA: Diagnosis not present

## 2017-11-22 DIAGNOSIS — I4819 Other persistent atrial fibrillation: Secondary | ICD-10-CM

## 2017-11-22 DIAGNOSIS — I481 Persistent atrial fibrillation: Secondary | ICD-10-CM | POA: Diagnosis not present

## 2017-11-22 DIAGNOSIS — K429 Umbilical hernia without obstruction or gangrene: Secondary | ICD-10-CM | POA: Diagnosis not present

## 2017-11-22 NOTE — Progress Notes (Signed)
Subjective:     Patient ID: Collin [name], male   DOB: 12-27-1940, 77 y.o.   MRN: [id number]  HPI Patient seen for transitional care follow-up. He was admitted on March 9 with small bowel obstruction and was there for almost 1 week. 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 He has gastrostomy feeding tube and has been getting nutritional supplements with Osmolite. He still has some difficulty with swallowing since his radiation. He has lost another 11 pounds since was here in December. He does have history of type 2 diabetes which is basically resolved with his weight loss. Recent albumin 2.9.  Denies any recurrent nausea or vomiting. He does have umbilical hernia which has been symptomatic - frequently painful. CT when he was admitted showed dilated stomach and small bowel consistent with mechanical small bowel obstruction with transition point visualized within the anterior abdomen at the level of the patient's umbilical hernia and felt likely to be secondary to adhesion. Patient had some incidental calcified gallstones.  He has history of nonischemic cardiomyopathy followed by cardiology. He has automatic defibrillator  Past Medical History:  Diagnosis Date  . AICD (automatic cardioverter/defibrillator) present   . Atrial fibrillation (HCC)   . Cancer (HCC)    tongue cancer  . Cardiomyopathy, nonischemic (HCC) 1998  . CHF (congestive heart failure) (HCC)   . Claustrophobia   . Diabetes mellitus   . Dysrhythmia   . Heart disease   . History of echocardiogram    Echo 8/18:  EF 55-60, no RWMA, severe LAE, normal RVSF, mild RAE  . History of radiation therapy 12/07/16- 01/24/17   Base of Tongue 70 Gy in 35 fractions  . Hyperlipidemia   . OSA (obstructive sleep apnea)   . Shingles   . Umbilical hernia   . 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 It  sometimes shows as a shadow on scans.    Past Surgical History:  Procedure Laterality Date  . CARDIAC DEFIBRILLATOR PLACEMENT  2006  . CATARACT EXTRACTION Bilateral    01/07/2016. 01/22/2016  . CYST EXCISION     multiple drainage to a cyst in his neck.   Marland Kitchen DIRECT LARYNGOSCOPY N/A 11/16/2016   Procedure: DIRECT LARYNGOSCOPY AND BIOPSY;  Surgeon: [name], MD;  Location: [facility] OR;  Service: ENT;  Laterality: N/A;  . EYE SURGERY    . HERNIA REPAIR     1950's  . IMPLANTABLE CARDIOVERTER DEFIBRILLATOR (ICD) GENERATOR CHANGE N/A 05/10/2014   Procedure: ICD GENERATOR CHANGE;  Surgeon: [name], MD;  Location: [facility] C[facility] LAB;  Service: Cardiovascular;  Laterality: N/A;  . IR GASTROSTOMY TUBE MOD SED  12/27/2016  . IR PATIENT EVAL TECH 0-60 MINS  01/17/2017  . MASS EXCISION Right 11/16/2016   Procedure: EXCISION RIGHT NECK LIPOMA;  Surgeon: [name], MD;  Location: [facility] OR;  Service: ENT;  Laterality: Right;  . MOHS SURGERY  01/01/2015   to top of head  . MOUTH SURGERY     teeth removed 4 teeth extracted 02/2010  . RADICAL NECK DISSECTION Right 07/25/2017   Procedure: RIGHT NECK DISSECTION;  Surgeon: [name], MD;  Location: [facility] OR;  Service: ENT;  Laterality: Right;    reports that  has never smoked. he has never used smokeless tobacco. He reports that he drinks alcohol. He reports that he does not  use drugs. family history includes Heart disease in his mother; Hypertension in his father; Lupus in his mother; Stroke in his father; Sudden death in his mother. Allergies  Allergen Reactions  . Niaspan [Niacin Er] Other (See Comments)    REACTION NOT AN ALLERGY >  FLUSHED, EXPECTED W/NIACIN     Review of Systems  Constitutional: Positive for fatigue.  Eyes: Negative for visual disturbance.  Respiratory: Negative for cough, chest tightness and shortness of breath.   Cardiovascular: Negative for chest pain, palpitations and leg swelling.  Gastrointestinal: Positive  for abdominal pain. Negative for nausea and vomiting.  Neurological: Positive for weakness. Negative for dizziness, syncope, light-headedness and headaches.       Objective:   Physical Exam  Constitutional: He appears well-developed and well-nourished.  Cardiovascular: Normal rate.  Pulmonary/Chest: Effort normal and breath sounds normal. No respiratory distress. He has no wheezes. He has no rales.  Abdominal: Soft. Bowel sounds are normal.  Patient has umbilical hernia which is soft. Minimally tender but easily reducible. No warmth or erythema  Musculoskeletal:  Trace edema legs bilaterally  Neurological: He is alert.       Assessment:     #1 recent mechanical small bowel obstruction  #2 history of paroxysmal atrial fibrillation on chronic Coumadin and rate controlled  #3 chronic diastolic heart failure  #4 history of malignancy base of tongue  #5 Umbilical hernia which is becoming more symptomatic in terms of pain-no evidence for acute strangulation.  #6 protein calorie malnutrition with recent albumin 2.9    Plan:     -Continue nutrition supplement with Osmolite -Patient and wife requesting referral to general surgery to discuss possible umbilical hernia repair -Routine follow-up here in 3 months and recheck A1c then  [name] MD [facility] at [facility]

## 2017-11-23 ENCOUNTER — Ambulatory Visit: Payer: Self-pay

## 2017-11-24 ENCOUNTER — Other Ambulatory Visit: Payer: Self-pay

## 2017-11-24 DIAGNOSIS — I480 Paroxysmal atrial fibrillation: Secondary | ICD-10-CM

## 2017-11-24 DIAGNOSIS — I428 Other cardiomyopathies: Secondary | ICD-10-CM

## 2017-11-27 DIAGNOSIS — I428 Other cardiomyopathies: Secondary | ICD-10-CM | POA: Diagnosis not present

## 2017-11-27 DIAGNOSIS — C140 Malignant neoplasm of pharynx, unspecified: Secondary | ICD-10-CM | POA: Diagnosis not present

## 2017-11-28 DIAGNOSIS — I428 Other cardiomyopathies: Secondary | ICD-10-CM | POA: Diagnosis not present

## 2017-11-28 DIAGNOSIS — C140 Malignant neoplasm of pharynx, unspecified: Secondary | ICD-10-CM | POA: Diagnosis not present

## 2017-11-29 DIAGNOSIS — I428 Other cardiomyopathies: Secondary | ICD-10-CM | POA: Diagnosis not present

## 2017-11-29 DIAGNOSIS — C140 Malignant neoplasm of pharynx, unspecified: Secondary | ICD-10-CM | POA: Diagnosis not present

## 2017-11-30 DIAGNOSIS — I428 Other cardiomyopathies: Secondary | ICD-10-CM | POA: Diagnosis not present

## 2017-11-30 DIAGNOSIS — C140 Malignant neoplasm of pharynx, unspecified: Secondary | ICD-10-CM | POA: Diagnosis not present

## 2017-12-01 DIAGNOSIS — C140 Malignant neoplasm of pharynx, unspecified: Secondary | ICD-10-CM | POA: Diagnosis not present

## 2017-12-01 DIAGNOSIS — I428 Other cardiomyopathies: Secondary | ICD-10-CM | POA: Diagnosis not present

## 2017-12-02 ENCOUNTER — Other Ambulatory Visit: Payer: Medicare HMO | Admitting: *Deleted

## 2017-12-02 DIAGNOSIS — I428 Other cardiomyopathies: Secondary | ICD-10-CM

## 2017-12-02 DIAGNOSIS — I480 Paroxysmal atrial fibrillation: Secondary | ICD-10-CM | POA: Diagnosis not present

## 2017-12-02 DIAGNOSIS — C140 Malignant neoplasm of pharynx, unspecified: Secondary | ICD-10-CM | POA: Diagnosis not present

## 2017-12-02 LAB — BASIC METABOLIC PANEL
BUN/Creatinine Ratio: 20 (ref 10–24)
BUN: 17 mg/dL (ref 8–27)
CO2: 26 mmol/L (ref 20–29)
Calcium: 9.3 mg/dL (ref 8.6–10.2)
Chloride: 100 mmol/L (ref 96–106)
Creatinine, Ser: 0.83 mg/dL (ref 0.76–1.27)
GFR calc Af Amer: 99 mL/min/{1.73_m2} (ref >59)
GFR calc non Af Amer: 85 mL/min/{1.73_m2} (ref >59)
Glucose: 91 mg/dL (ref 65–99)
Potassium: 4 mmol/L (ref 3.5–5.2)
Sodium: 139 mmol/L (ref 134–144)

## 2017-12-02 LAB — CUP PACEART INCLINIC DEVICE CHECK
Brady Statistic RV Percent Paced: 22 %
Date Time Interrogation Session: 20190307050000
HighPow Impedance: 40 Ohm
Implantable Lead Implant Date: 20060209
Implantable Lead Location: 753860
Implantable Lead Model: 158
Implantable Lead Serial Number: 156422
Implantable Pulse Generator Implant Date: 20150904
Lead Channel Impedance Value: 376 Ohm
Lead Channel Pacing Threshold Amplitude: 1 V
Lead Channel Pacing Threshold Pulse Width: 0.4 ms
Lead Channel Sensing Intrinsic Amplitude: 11.6 mV
Lead Channel Setting Pacing Amplitude: 2.4 V
Lead Channel Setting Pacing Pulse Width: 0.4 ms
Lead Channel Setting Sensing Sensitivity: 0.5 mV
Pulse Gen Serial Number: 191056

## 2017-12-02 LAB — PRO B NATRIURETIC PEPTIDE: NT-Pro BNP: 606 pg/mL — ABNORMAL HIGH (ref 0–486)

## 2017-12-03 DIAGNOSIS — I428 Other cardiomyopathies: Secondary | ICD-10-CM | POA: Diagnosis not present

## 2017-12-03 DIAGNOSIS — C140 Malignant neoplasm of pharynx, unspecified: Secondary | ICD-10-CM | POA: Diagnosis not present

## 2017-12-04 DIAGNOSIS — I428 Other cardiomyopathies: Secondary | ICD-10-CM | POA: Diagnosis not present

## 2017-12-04 DIAGNOSIS — C140 Malignant neoplasm of pharynx, unspecified: Secondary | ICD-10-CM | POA: Diagnosis not present

## 2017-12-05 ENCOUNTER — Ambulatory Visit (INDEPENDENT_AMBULATORY_CARE_PROVIDER_SITE_OTHER): Payer: Medicare HMO | Admitting: General Practice

## 2017-12-05 DIAGNOSIS — I4891 Unspecified atrial fibrillation: Secondary | ICD-10-CM | POA: Diagnosis not present

## 2017-12-05 DIAGNOSIS — E785 Hyperlipidemia, unspecified: Secondary | ICD-10-CM | POA: Diagnosis not present

## 2017-12-05 DIAGNOSIS — C140 Malignant neoplasm of pharynx, unspecified: Secondary | ICD-10-CM | POA: Diagnosis not present

## 2017-12-05 DIAGNOSIS — I480 Paroxysmal atrial fibrillation: Secondary | ICD-10-CM | POA: Diagnosis not present

## 2017-12-05 DIAGNOSIS — I10 Essential (primary) hypertension: Secondary | ICD-10-CM | POA: Diagnosis not present

## 2017-12-05 DIAGNOSIS — Z823 Family history of stroke: Secondary | ICD-10-CM | POA: Diagnosis not present

## 2017-12-05 DIAGNOSIS — I429 Cardiomyopathy, unspecified: Secondary | ICD-10-CM | POA: Diagnosis not present

## 2017-12-05 DIAGNOSIS — Z7901 Long term (current) use of anticoagulants: Secondary | ICD-10-CM | POA: Diagnosis not present

## 2017-12-05 DIAGNOSIS — Z8249 Family history of ischemic heart disease and other diseases of the circulatory system: Secondary | ICD-10-CM | POA: Diagnosis not present

## 2017-12-05 DIAGNOSIS — E669 Obesity, unspecified: Secondary | ICD-10-CM | POA: Diagnosis not present

## 2017-12-05 DIAGNOSIS — I428 Other cardiomyopathies: Secondary | ICD-10-CM | POA: Diagnosis not present

## 2017-12-05 DIAGNOSIS — Z6831 Body mass index (BMI) 31.0-31.9, adult: Secondary | ICD-10-CM | POA: Diagnosis not present

## 2017-12-05 DIAGNOSIS — Z931 Gastrostomy status: Secondary | ICD-10-CM | POA: Diagnosis not present

## 2017-12-05 LAB — POCT INR: INR: 4.4

## 2017-12-05 NOTE — Patient Instructions (Addendum)
Pre visit review using our clinic review tool, if applicable. No additional management support is needed unless otherwise documented below in the visit note.  Skip coumadin today and tomorrow (4/1 and 4/2) and then take 1 tablet daily except 1 1/2 on Wednesdays only.  Re-check in 3 weeks.

## 2017-12-06 DIAGNOSIS — I428 Other cardiomyopathies: Secondary | ICD-10-CM | POA: Diagnosis not present

## 2017-12-06 DIAGNOSIS — C140 Malignant neoplasm of pharynx, unspecified: Secondary | ICD-10-CM | POA: Diagnosis not present

## 2017-12-07 DIAGNOSIS — I428 Other cardiomyopathies: Secondary | ICD-10-CM | POA: Diagnosis not present

## 2017-12-07 DIAGNOSIS — C140 Malignant neoplasm of pharynx, unspecified: Secondary | ICD-10-CM | POA: Diagnosis not present

## 2017-12-08 DIAGNOSIS — I428 Other cardiomyopathies: Secondary | ICD-10-CM | POA: Diagnosis not present

## 2017-12-08 DIAGNOSIS — C140 Malignant neoplasm of pharynx, unspecified: Secondary | ICD-10-CM | POA: Diagnosis not present

## 2017-12-09 DIAGNOSIS — C140 Malignant neoplasm of pharynx, unspecified: Secondary | ICD-10-CM | POA: Diagnosis not present

## 2017-12-09 DIAGNOSIS — I428 Other cardiomyopathies: Secondary | ICD-10-CM | POA: Diagnosis not present

## 2017-12-10 DIAGNOSIS — I428 Other cardiomyopathies: Secondary | ICD-10-CM | POA: Diagnosis not present

## 2017-12-10 DIAGNOSIS — C140 Malignant neoplasm of pharynx, unspecified: Secondary | ICD-10-CM | POA: Diagnosis not present

## 2017-12-11 DIAGNOSIS — C140 Malignant neoplasm of pharynx, unspecified: Secondary | ICD-10-CM | POA: Diagnosis not present

## 2017-12-11 DIAGNOSIS — I428 Other cardiomyopathies: Secondary | ICD-10-CM | POA: Diagnosis not present

## 2017-12-12 DIAGNOSIS — I428 Other cardiomyopathies: Secondary | ICD-10-CM | POA: Diagnosis not present

## 2017-12-12 DIAGNOSIS — K429 Umbilical hernia without obstruction or gangrene: Secondary | ICD-10-CM | POA: Diagnosis not present

## 2017-12-12 DIAGNOSIS — C140 Malignant neoplasm of pharynx, unspecified: Secondary | ICD-10-CM | POA: Diagnosis not present

## 2017-12-13 DIAGNOSIS — I428 Other cardiomyopathies: Secondary | ICD-10-CM | POA: Diagnosis not present

## 2017-12-13 DIAGNOSIS — C140 Malignant neoplasm of pharynx, unspecified: Secondary | ICD-10-CM | POA: Diagnosis not present

## 2017-12-14 ENCOUNTER — Encounter: Payer: Self-pay | Admitting: Family Medicine

## 2017-12-14 DIAGNOSIS — C140 Malignant neoplasm of pharynx, unspecified: Secondary | ICD-10-CM | POA: Diagnosis not present

## 2017-12-14 DIAGNOSIS — I428 Other cardiomyopathies: Secondary | ICD-10-CM | POA: Diagnosis not present

## 2017-12-14 DIAGNOSIS — R490 Dysphonia: Secondary | ICD-10-CM | POA: Diagnosis not present

## 2017-12-14 DIAGNOSIS — R1313 Dysphagia, pharyngeal phase: Secondary | ICD-10-CM | POA: Diagnosis not present

## 2017-12-15 DIAGNOSIS — C140 Malignant neoplasm of pharynx, unspecified: Secondary | ICD-10-CM | POA: Diagnosis not present

## 2017-12-15 DIAGNOSIS — I428 Other cardiomyopathies: Secondary | ICD-10-CM | POA: Diagnosis not present

## 2017-12-16 DIAGNOSIS — I428 Other cardiomyopathies: Secondary | ICD-10-CM | POA: Diagnosis not present

## 2017-12-16 DIAGNOSIS — C140 Malignant neoplasm of pharynx, unspecified: Secondary | ICD-10-CM | POA: Diagnosis not present

## 2017-12-17 DIAGNOSIS — I428 Other cardiomyopathies: Secondary | ICD-10-CM | POA: Diagnosis not present

## 2017-12-17 DIAGNOSIS — C140 Malignant neoplasm of pharynx, unspecified: Secondary | ICD-10-CM | POA: Diagnosis not present

## 2017-12-18 DIAGNOSIS — C140 Malignant neoplasm of pharynx, unspecified: Secondary | ICD-10-CM | POA: Diagnosis not present

## 2017-12-18 DIAGNOSIS — I428 Other cardiomyopathies: Secondary | ICD-10-CM | POA: Diagnosis not present

## 2017-12-19 ENCOUNTER — Ambulatory Visit (INDEPENDENT_AMBULATORY_CARE_PROVIDER_SITE_OTHER): Payer: Medicare HMO | Admitting: *Deleted

## 2017-12-19 DIAGNOSIS — I428 Other cardiomyopathies: Secondary | ICD-10-CM | POA: Diagnosis not present

## 2017-12-19 DIAGNOSIS — C140 Malignant neoplasm of pharynx, unspecified: Secondary | ICD-10-CM | POA: Diagnosis not present

## 2017-12-19 NOTE — Progress Notes (Signed)
Remote ICD transmission.   

## 2017-12-20 DIAGNOSIS — I428 Other cardiomyopathies: Secondary | ICD-10-CM | POA: Diagnosis not present

## 2017-12-20 DIAGNOSIS — C140 Malignant neoplasm of pharynx, unspecified: Secondary | ICD-10-CM | POA: Diagnosis not present

## 2017-12-21 ENCOUNTER — Encounter: Payer: Self-pay | Admitting: Cardiology

## 2017-12-21 ENCOUNTER — Telehealth: Payer: Self-pay | Admitting: *Deleted

## 2017-12-21 DIAGNOSIS — I428 Other cardiomyopathies: Secondary | ICD-10-CM | POA: Diagnosis not present

## 2017-12-21 DIAGNOSIS — C140 Malignant neoplasm of pharynx, unspecified: Secondary | ICD-10-CM | POA: Diagnosis not present

## 2017-12-21 NOTE — Telephone Encounter (Signed)
In light of medical issues - felt to be at increased risk - would like for [name] to weigh in as well.   Will route to [name] for her input as well as to pharmacy.   [name] - please route back to CV div preop.   Burtis Junes, RN, [place] [address] [place], [place]  [phone]

## 2017-12-21 NOTE — Telephone Encounter (Signed)
   [facility] Pre-operative Risk Assessment    Request for surgical clearance:  1. What type of surgery is being performed? UMBILICAL HERNIA REPAIR   2. When is this surgery scheduled? TBD   3. What type of clearance is required (medical clearance vs. Pharmacy clearance to hold med vs. Both)? BOTH  4. Are there any medications that need to be held prior to surgery and how long? PATIENT CURRENTLY ON WARFARIN AND WILL MOST LIKELY REQUIRE LOVENOX BRIDGE   5. Practice name and name of physician performing surgery? [facility] --Luella Cook MD   6. What is your office phone [phone]    7.   What is your office fax number [phone]  8.   Anesthesia type (None, local, MAC, general) ? GENERAL ANESTHESIA    Nuala Alpha 12/21/2017, 11:14 AM  _________________________________________________________________   (provider comments below)

## 2017-12-22 DIAGNOSIS — C140 Malignant neoplasm of pharynx, unspecified: Secondary | ICD-10-CM | POA: Diagnosis not present

## 2017-12-22 DIAGNOSIS — I428 Other cardiomyopathies: Secondary | ICD-10-CM | POA: Diagnosis not present

## 2017-12-22 NOTE — Telephone Encounter (Signed)
Pt takes warfarin for afib with CHADS2VASc score of 5 (age x2, CHF, DM, CAD). Ok to hold warfarin for 5 days prior to procedure. He does NOT require a Lovenox bridge. Warfarin is managed by [facility] with [facility] - please route clearance to her as well as an FYI for patient coming off of Coumadin.

## 2017-12-23 DIAGNOSIS — C140 Malignant neoplasm of pharynx, unspecified: Secondary | ICD-10-CM | POA: Diagnosis not present

## 2017-12-23 DIAGNOSIS — I428 Other cardiomyopathies: Secondary | ICD-10-CM | POA: Diagnosis not present

## 2017-12-23 NOTE — Telephone Encounter (Signed)
Clearance faxed via Epic and fax machine

## 2017-12-24 DIAGNOSIS — I428 Other cardiomyopathies: Secondary | ICD-10-CM | POA: Diagnosis not present

## 2017-12-24 DIAGNOSIS — C140 Malignant neoplasm of pharynx, unspecified: Secondary | ICD-10-CM | POA: Diagnosis not present

## 2017-12-25 DIAGNOSIS — C140 Malignant neoplasm of pharynx, unspecified: Secondary | ICD-10-CM | POA: Diagnosis not present

## 2017-12-25 DIAGNOSIS — I428 Other cardiomyopathies: Secondary | ICD-10-CM | POA: Diagnosis not present

## 2017-12-26 ENCOUNTER — Ambulatory Visit (INDEPENDENT_AMBULATORY_CARE_PROVIDER_SITE_OTHER): Payer: Medicare HMO | Admitting: General Practice

## 2017-12-26 DIAGNOSIS — C140 Malignant neoplasm of pharynx, unspecified: Secondary | ICD-10-CM | POA: Diagnosis not present

## 2017-12-26 DIAGNOSIS — Z7901 Long term (current) use of anticoagulants: Secondary | ICD-10-CM

## 2017-12-26 DIAGNOSIS — I428 Other cardiomyopathies: Secondary | ICD-10-CM | POA: Diagnosis not present

## 2017-12-26 DIAGNOSIS — I480 Paroxysmal atrial fibrillation: Secondary | ICD-10-CM | POA: Diagnosis not present

## 2017-12-26 LAB — POCT INR: INR: 3.1

## 2017-12-26 NOTE — Patient Instructions (Addendum)
Pre visit review using our clinic review tool, if applicable. No additional management support is needed unless otherwise documented below in the visit note.  Take 1/2 tablet today (4/22) and then continue to take 1 tablet daily except 1 1/2 on Wednesdays only.  Re-check in 4 weeks.

## 2017-12-27 ENCOUNTER — Ambulatory Visit: Payer: Self-pay | Admitting: General Surgery

## 2017-12-27 DIAGNOSIS — C140 Malignant neoplasm of pharynx, unspecified: Secondary | ICD-10-CM | POA: Diagnosis not present

## 2017-12-27 DIAGNOSIS — I428 Other cardiomyopathies: Secondary | ICD-10-CM | POA: Diagnosis not present

## 2017-12-28 DIAGNOSIS — I428 Other cardiomyopathies: Secondary | ICD-10-CM | POA: Diagnosis not present

## 2017-12-28 DIAGNOSIS — C140 Malignant neoplasm of pharynx, unspecified: Secondary | ICD-10-CM | POA: Diagnosis not present

## 2017-12-28 LAB — CUP PACEART REMOTE DEVICE CHECK
Battery Remaining Longevity: 144 mo
Battery Remaining Percentage: 100 %
Brady Statistic RV Percent Paced: 25 %
Date Time Interrogation Session: 20190416110200
HighPow Impedance: 38 Ohm
Implantable Lead Implant Date: 20060209
Implantable Lead Location: 753860
Implantable Lead Model: 158
Implantable Lead Serial Number: 156422
Implantable Pulse Generator Implant Date: 20150904
Lead Channel Impedance Value: 360 Ohm
Lead Channel Pacing Threshold Amplitude: 1 V
Lead Channel Pacing Threshold Pulse Width: 0.4 ms
Lead Channel Setting Pacing Amplitude: 2.4 V
Lead Channel Setting Pacing Pulse Width: 0.4 ms
Lead Channel Setting Sensing Sensitivity: 0.5 mV
Pulse Gen Serial Number: 191056

## 2018-01-11 ENCOUNTER — Ambulatory Visit (INDEPENDENT_AMBULATORY_CARE_PROVIDER_SITE_OTHER): Payer: Medicare HMO | Admitting: Ophthalmology

## 2018-01-11 ENCOUNTER — Telehealth: Payer: Self-pay | Admitting: *Deleted

## 2018-01-11 DIAGNOSIS — H35373 Puckering of macula, bilateral: Secondary | ICD-10-CM

## 2018-01-11 DIAGNOSIS — E113293 Type 2 diabetes mellitus with mild nonproliferative diabetic retinopathy without macular edema, bilateral: Secondary | ICD-10-CM | POA: Diagnosis not present

## 2018-01-11 DIAGNOSIS — H35033 Hypertensive retinopathy, bilateral: Secondary | ICD-10-CM | POA: Diagnosis not present

## 2018-01-11 DIAGNOSIS — I10 Essential (primary) hypertension: Secondary | ICD-10-CM

## 2018-01-11 DIAGNOSIS — H2703 Aphakia, bilateral: Secondary | ICD-10-CM

## 2018-01-11 DIAGNOSIS — H43813 Vitreous degeneration, bilateral: Secondary | ICD-10-CM | POA: Diagnosis not present

## 2018-01-11 DIAGNOSIS — H26493 Other secondary cataract, bilateral: Secondary | ICD-10-CM | POA: Diagnosis not present

## 2018-01-11 DIAGNOSIS — E11319 Type 2 diabetes mellitus with unspecified diabetic retinopathy without macular edema: Secondary | ICD-10-CM | POA: Diagnosis not present

## 2018-01-11 LAB — HM DIABETES EYE EXAM

## 2018-01-11 NOTE — Telephone Encounter (Signed)
Returned patient's phone call, lvm 

## 2018-01-13 NOTE — Progress Notes (Signed)
Chart reviewed with Dr Lissa Hoard, due to patient hx and multiple comorbidities, he will be better served being done at [place]. Dr Marlou Starks office made aware, spoke with Caryl Pina.

## 2018-01-16 ENCOUNTER — Encounter: Payer: Self-pay | Admitting: Family Medicine

## 2018-01-16 NOTE — Pre-Procedure Instructions (Addendum)
Collin [name]  01/16/2018      [facility] [address] [place] [address] Phone: [phone] Fax: [phone]    Your procedure is scheduled on 01/18/2018.  Report to [facility] Admitting at 1:00 P.M.  Call this number if you have problems the morning of surgery:  [phone]   Remember:  Do not eat food or drink liquids after midnight.   Please complete your PRE-SURGERY ENSURE that was given to before you leave your house the morning of surgery.  Please, if able, drink it in one sitting. DO NOT SIP.   Continue all medications as directed by your physician except follow these medication instructions before surgery below   Take these medicines the morning of surgery with A SIP OF WATER: Acetaminophen (Tylenol) - if needed Carvedilol (Coreg) Digoxin (Lanoxin)  7 days prior to surgery STOP taking any Aspirin (unless otherwise instructed by your surgeon), Aleve, Naproxen, Ibuprofen, Motrin, Advil, Goody's, BC's, all herbal medications, fish oil, and all vitamins  Follow your doctors instructions regarding your Warfarin (Coumadin).  Stop your Warfarin (Coumadin) 5 days prior to surgery.   WHAT DO I DO ABOUT MY DIABETES MEDICATION? Marland Kitchen Do not take oral diabetes medicines (pills) the morning of surgery.    How to Manage Your Diabetes Before and After Surgery  Why is it important to control my blood sugar before and after surgery? . Improving blood sugar levels before and after surgery helps healing and can limit problems. . A way of improving blood sugar control is eating a healthy diet by: o  Eating less sugar and carbohydrates o  Increasing activity/exercise o  Talking with your doctor about reaching your blood sugar goals . High blood sugars (greater than 180 mg/dL) can raise your risk of infections and slow your recovery, so you will need to focus on controlling your diabetes  during the weeks before surgery. . Make sure that the doctor who takes care of your diabetes knows about your planned surgery including the date and location.  How do I manage my blood sugar before surgery? . Check your blood sugar at least 4 times a day, starting 2 days before surgery, to make sure that the level is not too high or low. o Check your blood sugar the morning of your surgery when you wake up and every 2 hours until you get to the Short Stay unit. . If your blood sugar is less than 70 mg/dL, you will need to treat for low blood sugar: o Do not take insulin. o Treat a low blood sugar (less than 70 mg/dL) with  cup of clear juice (cranberry or apple), 4 glucose tablets, OR glucose gel. Recheck blood sugar in 15 minutes after treatment (to make sure it is greater than 70 mg/dL). If your blood sugar is not greater than 70 mg/dL on recheck, call [phone] o  for further instructions. . Report your blood sugar to the short stay nurse when you get to Short Stay.  . If you are admitted to the hospital after surgery: o Your blood sugar will be checked by the staff and you will probably be given insulin after surgery (instead of oral diabetes medicines) to make sure you have good blood sugar levels. o The goal for blood sugar control after surgery is 80-180 mg/dL.    Do not wear jewelry  Do not wear lotions, powders, or colognes, or deodorant.  Men  may shave face and neck.  Do not bring valuables to the hospital.  [facility] is not responsible for any belongings or valuables.  Hearing aids, eyeglasses, contacts, dentures or bridgework may not be worn into surgery.  Leave your suitcase in the car.  After surgery it may be brought to your room.  For patients admitted to the hospital, discharge time will be determined by your treatment team.  Patients discharged the day of surgery will not be allowed to drive home.   Name and phone number of your driver:    Special instructions:    [facility]- Preparing For Surgery  Before surgery, you can play an important role. Because skin is not sterile, your skin needs to be as free of germs as possible. You can reduce the number of germs on your skin by washing with CHG (chlorahexidine gluconate) Soap before surgery.  CHG is an antiseptic cleaner which kills germs and bonds with the skin to continue killing germs even after washing.  Oral Hygiene is also important to reduce your risk of infection.  Remember - BRUSH YOUR TEETH THE MORNING OF SURGERY  Please do not use if you have an allergy to CHG or antibacterial soaps. If your skin becomes reddened/irritated stop using the CHG.  Do not shave (including legs and underarms) for at least 48 hours prior to first CHG shower. It is OK to shave your face.  Please follow these instructions carefully.   1. Shower the NIGHT BEFORE SURGERY and the MORNING OF SURGERY with CHG.   2. If you chose to wash your hair, wash your hair first as usual with your normal shampoo.  3. After you shampoo, rinse your hair and body thoroughly to remove the shampoo.  4. Use CHG as you would any other liquid soap. You can apply CHG directly to the skin and wash gently with a scrungie or a clean washcloth.   5. Apply the CHG Soap to your body ONLY FROM THE NECK DOWN.  Do not use on open wounds or open sores. Avoid contact with your eyes, ears, mouth and genitals (private parts). Wash Face and genitals (private parts)  with your normal soap.  6. Wash thoroughly, paying special attention to the area where your surgery will be performed.  7. Thoroughly rinse your body with warm water from the neck down.  8. DO NOT shower/wash with your normal soap after using and rinsing off the CHG Soap.  9. Pat yourself dry with a CLEAN TOWEL.  10. Wear CLEAN PAJAMAS to bed the night before surgery, wear comfortable clothes the morning of surgery  11. Place CLEAN SHEETS on your bed the night of your first shower and DO  NOT SLEEP WITH PETS.    Day of Surgery: Shower as stated above. Do not apply any deodorants/lotions.  Please wear clean clothes to the hospital/surgery center.   Remember to brush your teeth.      Please read over the following fact sheets that you were given.

## 2018-01-17 ENCOUNTER — Telehealth: Payer: Self-pay

## 2018-01-17 ENCOUNTER — Encounter (HOSPITAL_COMMUNITY)
Admission: RE | Admit: 2018-01-17 | Discharge: 2018-01-17 | Disposition: A | Discharge status: Home / Self Care | Payer: Medicare HMO | Source: Ambulatory Visit | Attending: General Surgery | Admitting: General Surgery

## 2018-01-17 ENCOUNTER — Encounter (HOSPITAL_COMMUNITY): Payer: Self-pay

## 2018-01-17 ENCOUNTER — Other Ambulatory Visit: Payer: Self-pay

## 2018-01-17 DIAGNOSIS — G4730 Sleep apnea, unspecified: Secondary | ICD-10-CM | POA: Diagnosis not present

## 2018-01-17 DIAGNOSIS — E119 Type 2 diabetes mellitus without complications: Secondary | ICD-10-CM | POA: Diagnosis not present

## 2018-01-17 DIAGNOSIS — I4891 Unspecified atrial fibrillation: Secondary | ICD-10-CM | POA: Diagnosis not present

## 2018-01-17 DIAGNOSIS — K429 Umbilical hernia without obstruction or gangrene: Secondary | ICD-10-CM | POA: Diagnosis present

## 2018-01-17 DIAGNOSIS — Z7901 Long term (current) use of anticoagulants: Secondary | ICD-10-CM | POA: Diagnosis not present

## 2018-01-17 DIAGNOSIS — E7800 Pure hypercholesterolemia, unspecified: Secondary | ICD-10-CM | POA: Diagnosis not present

## 2018-01-17 DIAGNOSIS — Z7984 Long term (current) use of oral hypoglycemic drugs: Secondary | ICD-10-CM | POA: Diagnosis not present

## 2018-01-17 DIAGNOSIS — Z79899 Other long term (current) drug therapy: Secondary | ICD-10-CM | POA: Diagnosis not present

## 2018-01-17 DIAGNOSIS — I10 Essential (primary) hypertension: Secondary | ICD-10-CM | POA: Diagnosis not present

## 2018-01-17 HISTORY — DX: Other amnesia: R41.3

## 2018-01-17 LAB — CBC
HCT: 40.4 % (ref 39.0–52.0)
Hemoglobin: 13.1 g/dL (ref 13.0–17.0)
MCH: 29.8 pg (ref 26.0–34.0)
MCHC: 32.4 g/dL (ref 30.0–36.0)
MCV: 92 fL (ref 78.0–100.0)
Platelets: 222 10*3/uL (ref 150–400)
RBC: 4.39 MIL/uL (ref 4.22–5.81)
RDW: 13.8 % (ref 11.5–15.5)
WBC: 3.2 10*3/uL — ABNORMAL LOW (ref 4.0–10.5)

## 2018-01-17 LAB — BASIC METABOLIC PANEL
Anion gap: 13 (ref 5–15)
BUN: 19 mg/dL (ref 6–20)
CO2: 32 mmol/L (ref 22–32)
Calcium: 9 mg/dL (ref 8.9–10.3)
Chloride: 94 mmol/L — ABNORMAL LOW (ref 101–111)
Creatinine, Ser: 0.87 mg/dL (ref 0.61–1.24)
GFR calc Af Amer: >60 mL/min (ref >60)
GFR calc non Af Amer: >60 mL/min (ref >60)
Glucose, Bld: 107 mg/dL — ABNORMAL HIGH (ref 65–99)
Potassium: 3.2 mmol/L — ABNORMAL LOW (ref 3.5–5.1)
Sodium: 139 mmol/L (ref 135–145)

## 2018-01-17 LAB — PROTIME-INR
INR: 1.75
Prothrombin Time: 20.3 seconds — ABNORMAL HIGH (ref 11.4–15.2)

## 2018-01-17 NOTE — Progress Notes (Signed)
Anesthesia Consult:   Case:  [id number] Date/Time:  01/18/18 1446   Procedures:      UMBILICAL HERNIA REPAIR WITH MESH (N/A )     INSERTION OF MESH (N/A )   Anesthesia type:  General   Pre-op diagnosis:  UMBILICAL HERNIA   Location:  [facility] OR ROOM 02 / [facility] OR   Surgeon:  Jovita Kussmaul, MD      DISCUSSION: Pt is a 77 year old male with hx PAF, CHF, nonischemic cardiomyopathy (recovered; EF 55-6% by 2018 echo), AICD [facility])  - Called to see pt in pre-admission testing for c/o worsening LLE edema.  Pt's wife has wrapped lower half of lower leg with ace wrap.  Calf/shin above ace wrap visible; anterior aspect of L lower leg with color changes consistent with venous stasis and there is significant 3+ pitting edema compared to RLE (with 1+ pitting edema).  LLE is nontender, with no warmth or erythema consistent with a DVT.  Pt denies feeling SOB or fatigued. Pt denies LLE pain. Wife and pt report BLE always edematous, and that edema improves/borderline resolves when pt is in a recliner and legs are elevated. LLE edema worse in last several days.   - I spoke with RN Harland German at [name]' office, questioning whether worsening LLE edema alters previously given cardiac clearance for surgery.  Cardiology cannot assess pt prior to surgery (it is tomorrow) and advised Dr. Marlou Starks to evaluated pt upon arrival for surgery.    - Hx throat cancer tx with radiation  - Has a PEG tube  - Last dose coumadin 01/13/18.  PT 20.3 at pre-admission testing.  Will recheck day of surgery  - Hospitalized 3/9-3/15/19 for SBO.    VS: BP 102/63   Pulse 77   Temp (!) 36.4 C   Resp 20   Ht 6' (1.829 m)   Wt 204 lb (92.5 kg)   SpO2 95%   BMI 27.67 kg/m   PROVIDERS: PCP is Eulas Post, MD   Patient Care Team: Eppie Gibson, MD as Attending Physician (Radiation Oncology) Heath Lark, MD as Consulting Physician (Hematology and Oncology) Rozetta Nunnery, MD as Consulting Physician  (Otolaryngology)   Cardiologist is Dorris Carnes, MD who cleared pt for surgery EP cardiologist is Cristopher Peru, MD   LABS:  - PT 20.3.  Will recheck day of surgery.   (all labs ordered are listed, but only abnormal results are displayed)  Labs Reviewed  BASIC METABOLIC PANEL - Abnormal; Notable for the following components:      Result Value   Potassium 3.2 (*)    Chloride 94 (*)    Glucose, Bld 107 (*)    All other components within normal limits  CBC - Abnormal; Notable for the following components:   WBC 3.2 (*)    All other components within normal limits  PROTIME-INR - Abnormal; Notable for the following components:   Prothrombin Time 20.3 (*)    All other components within normal limits    EKG 11/12/17: Atrial fibrillation. Inferior infarct, old. Anterior infarct, old   CV:  Echo 04/28/17:  - Left ventricle: Systolic function was normal. The estimated ejection fraction was in the range of 55% to 60%. Wall motion was normal; there were no regional wall motion abnormalities. - Left atrium: Severely dilated. - Right ventricle: The cavity size was normal. Wall thickness was normal. AICD wire noted in right ventricle. Systolic function was normal. - Right atrium: The atrium was mildly  dilated. AICD wire noted in right atrium. - Atrial septum: No defect or patent foramen ovale was identified. - Inferior vena cava: The vessel was normal in size. The respirophasic diameter changes were in the normal range (>= 50%), consistent with normal central venous pressure. - Impressions: Compared to a prior study in 2015, there are no significant changes.  Perioperative prescription form for ICD pending.    Past Medical History:  Diagnosis Date  . AICD (automatic cardioverter/defibrillator) present   . Atrial fibrillation ([place])   . Cancer (HCC)    tongue cancer  . Cardiomyopathy, nonischemic ([place]) 1998  . CHF (congestive heart failure) ([place])   . Claustrophobia   . Diabetes mellitus    . Dysrhythmia   . Heart disease   . History of echocardiogram    Echo 8/18:  EF 55-60, no RWMA, severe LAE, normal RVSF, mild RAE  . History of radiation therapy 12/07/16- 01/24/17   Base of Tongue 70 Gy in 35 fractions  . Hyperlipidemia   . Memory loss   . OSA (obstructive sleep apnea)   . Shingles   . Umbilical hernia   . 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 It sometimes shows as a shadow on scans.     Past Surgical History:  Procedure Laterality Date  . CARDIAC DEFIBRILLATOR PLACEMENT  2006  . CATARACT EXTRACTION Bilateral    01/07/2016. 01/22/2016  . CYST EXCISION     multiple drainage to a cyst in his neck.   Marland Kitchen DIRECT LARYNGOSCOPY N/A 11/16/2016   Procedure: DIRECT LARYNGOSCOPY AND BIOPSY;  Surgeon: Rozetta Nunnery, MD;  Location: [facility];  Service: ENT;  Laterality: N/A;  . EYE SURGERY    . HERNIA REPAIR     1950's  . IMPLANTABLE CARDIOVERTER DEFIBRILLATOR (ICD) GENERATOR CHANGE N/A 05/10/2014   Procedure: ICD GENERATOR CHANGE;  Surgeon: [name], MD;  Location: [facility] CATH LAB;  Service: Cardiovascular;  Laterality: N/A;  . IR GASTROSTOMY TUBE MOD SED  12/27/2016  . IR PATIENT EVAL TECH 0-60 MINS  01/17/2017  . MASS EXCISION Right 11/16/2016   Procedure: EXCISION RIGHT NECK LIPOMA;  Surgeon: Rozetta Nunnery, MD;  Location: [facility] [facility];  Service: ENT;  Laterality: Right;  . MOHS SURGERY  01/01/2015   to top of head  . MOUTH SURGERY     teeth removed 4 teeth extracted 02/2010  . RADICAL NECK DISSECTION Right 07/25/2017   Procedure: RIGHT NECK DISSECTION;  Surgeon: Rozetta Nunnery, MD;  Location: [facility];  Service: ENT;  Laterality: Right;    MEDICATIONS: . acetaminophen (TYLENOL) 500 MG tablet  . carvedilol (COREG) 25 MG tablet  . digoxin (LANOXIN) 0.125 MG tablet  . diltiazem (CARDIZEM) 90 MG tablet  . guaifenesin (ROBITUSSIN) 100 MG/5ML syrup  . Menthol, Topical Analgesic, (BENGAY EX)  . Nutritional  Supplements (FEEDING SUPPLEMENT, OSMOLITE 1.5 CAL,) LIQD  . Nutritional Supplements (PROMOD) LIQD  . polyethylene glycol (MIRALAX) packet  . potassium chloride (K-DUR) 10 MEQ tablet  . rosuvastatin (CRESTOR) 10 MG tablet  . sodium fluoride (PREVIDENT 5000 PLUS) 1.1 % CREA dental cream  . torsemide (DEMADEX) 20 MG tablet  . warfarin (COUMADIN) 5 MG tablet   No current facility-administered medications for this encounter.    - Last dose coumadin 01/13/18   Pt will need further assessment day of surgery by assigned anesthesiologist.   If no acute CV symptoms, and labs acceptable day of  surgery, I anticipate pt can proceed with surgery as scheduled.  Willeen Cass, FNP-BC [facility] Stay [facility]/Anesthesiology Phone: [phone] 01/17/2018 4:27 PM
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
## 2018-01-17 NOTE — Telephone Encounter (Signed)
Levada Dy, NP with Anesthesiology, called to report the patient went for pre-admission testing today and has 3+ pitting edema in his left leg. The right has 1+. She states she is not concerned for DVT as it is not painful, red, or hot.  Assessment today was compared to [name]' note. [name]' note mentions "chronic lower extremity edema" and "Legs are a little more swollen  Did recomm he watch salt more." Comparing assessments, it seems as if the patient's swelling has worsened, particularly on the left side.  Levada Dy wants to know if he is still cleared by Cardiology for hernia repair tomorrow (he was originally cleared by Cardiology 4/19).  Informed her that clearance was given based off assessment at the time, and the patient will be called to assess any acute issues.  Called Dr. Ethlyn Gallery office and spoke with triage nurse Abigail Butts. Informed her of above and that Cardiology is unable to assess the patient again prior to procedure time tomorrow. She states she will talk to Dr. Marlou Starks and call the patient with an update.  Called and spoke with the patient's wife. She states he is having no trouble breathing and that his swelling does seem a little worse than usual, but the issue is chronic "and it's been this way for months." Informed her Dr. Ethlyn Gallery office will call to update on surgery tomorrow after he reviews symptoms.

## 2018-01-18 ENCOUNTER — Ambulatory Visit (HOSPITAL_COMMUNITY): Payer: Medicare HMO | Admitting: Emergency Medicine

## 2018-01-18 ENCOUNTER — Encounter (HOSPITAL_COMMUNITY): Payer: Self-pay | Admitting: *Deleted

## 2018-01-18 ENCOUNTER — Encounter (HOSPITAL_COMMUNITY)
Admission: RE | Disposition: A | Discharge status: Home / Self Care | Payer: Self-pay | Source: Ambulatory Visit | Attending: General Surgery

## 2018-01-18 ENCOUNTER — Ambulatory Visit (HOSPITAL_COMMUNITY)
Admission: RE | Admit: 2018-01-18 | Discharge: 2018-01-19 | Disposition: A | Discharge status: Home / Self Care | Payer: Medicare HMO | Source: Ambulatory Visit | Attending: General Surgery | Admitting: General Surgery

## 2018-01-18 DIAGNOSIS — I5032 Chronic diastolic (congestive) heart failure: Secondary | ICD-10-CM | POA: Diagnosis not present

## 2018-01-18 DIAGNOSIS — K429 Umbilical hernia without obstruction or gangrene: Secondary | ICD-10-CM | POA: Diagnosis present

## 2018-01-18 DIAGNOSIS — Z79899 Other long term (current) drug therapy: Secondary | ICD-10-CM | POA: Diagnosis not present

## 2018-01-18 DIAGNOSIS — I10 Essential (primary) hypertension: Secondary | ICD-10-CM | POA: Insufficient documentation

## 2018-01-18 DIAGNOSIS — E119 Type 2 diabetes mellitus without complications: Secondary | ICD-10-CM | POA: Diagnosis not present

## 2018-01-18 DIAGNOSIS — I110 Hypertensive heart disease with heart failure: Secondary | ICD-10-CM | POA: Diagnosis not present

## 2018-01-18 DIAGNOSIS — I4891 Unspecified atrial fibrillation: Secondary | ICD-10-CM | POA: Diagnosis not present

## 2018-01-18 DIAGNOSIS — Z7984 Long term (current) use of oral hypoglycemic drugs: Secondary | ICD-10-CM | POA: Insufficient documentation

## 2018-01-18 DIAGNOSIS — G4733 Obstructive sleep apnea (adult) (pediatric): Secondary | ICD-10-CM | POA: Diagnosis not present

## 2018-01-18 DIAGNOSIS — E7800 Pure hypercholesterolemia, unspecified: Secondary | ICD-10-CM | POA: Insufficient documentation

## 2018-01-18 DIAGNOSIS — G4730 Sleep apnea, unspecified: Secondary | ICD-10-CM | POA: Insufficient documentation

## 2018-01-18 DIAGNOSIS — Z7901 Long term (current) use of anticoagulants: Secondary | ICD-10-CM | POA: Insufficient documentation

## 2018-01-18 HISTORY — PX: UMBILICAL HERNIA REPAIR: SHX196

## 2018-01-18 HISTORY — PX: INSERTION OF MESH: SHX5868

## 2018-01-18 LAB — GLUCOSE, CAPILLARY
Glucose-Capillary: 166 mg/dL — ABNORMAL HIGH (ref 65–99)
Glucose-Capillary: 102 mg/dL — ABNORMAL HIGH (ref 65–99)

## 2018-01-18 LAB — HEMOGLOBIN A1C
Hgb A1c MFr Bld: 5.7 % — ABNORMAL HIGH (ref 4.8–5.6)
Mean Plasma Glucose: 116.89 mg/dL

## 2018-01-18 LAB — PROTIME-INR
INR: 1.42
Prothrombin Time: 17.3 seconds — ABNORMAL HIGH (ref 11.4–15.2)

## 2018-01-18 SURGERY — REPAIR, HERNIA, UMBILICAL, ADULT
Anesthesia: General | Site: Abdomen

## 2018-01-18 MED ORDER — CARVEDILOL 25 MG PO TABS
25.0000 mg | ORAL_TABLET | Freq: Two times a day (BID) | ORAL | Status: DC
  Administered 2018-01-19: 25 mg via ORAL
  Filled 2018-01-18: qty 1

## 2018-01-18 MED ORDER — WARFARIN SODIUM 5 MG PO TABS: 5.0000 mg | ORAL_TABLET | Freq: Every day | ORAL | Status: DC

## 2018-01-18 MED ORDER — 0.9 % SODIUM CHLORIDE (POUR BTL) OPTIME
TOPICAL | Status: DC | PRN
  Administered 2018-01-18: 1000 mL

## 2018-01-18 MED ORDER — DIGOXIN 0.0625 MG HALF TABLET
0.0625 mg | ORAL_TABLET | Freq: Every day | ORAL | Status: DC
  Administered 2018-01-19: 0.0625 mg via ORAL
  Filled 2018-01-18: qty 1

## 2018-01-18 MED ORDER — PHENYLEPHRINE HCL 10 MG/ML IJ SOLN
INTRAMUSCULAR | Status: AC
  Filled 2018-01-18: qty 1

## 2018-01-18 MED ORDER — KCL IN DEXTROSE-NACL 20-5-0.9 MEQ/L-%-% IV SOLN
INTRAVENOUS | Status: DC
  Administered 2018-01-18: 22:00:00 via INTRAVENOUS
  Filled 2018-01-18: qty 1000

## 2018-01-18 MED ORDER — CELECOXIB 200 MG PO CAPS
200.0000 mg | ORAL_CAPSULE | ORAL | Status: AC
  Administered 2018-01-18: 200 mg via ORAL
  Filled 2018-01-18: qty 1

## 2018-01-18 MED ORDER — GABAPENTIN 300 MG PO CAPS
300.0000 mg | ORAL_CAPSULE | ORAL | Status: AC
  Administered 2018-01-18: 300 mg via ORAL
  Filled 2018-01-18: qty 1

## 2018-01-18 MED ORDER — HYDROCODONE-ACETAMINOPHEN 5-325 MG PO TABS: 1.0000 | ORAL_TABLET | ORAL | Status: DC | PRN

## 2018-01-18 MED ORDER — CEFAZOLIN SODIUM 1 G IJ SOLR
INTRAMUSCULAR | Status: AC
  Filled 2018-01-18: qty 20

## 2018-01-18 MED ORDER — DEXAMETHASONE SODIUM PHOSPHATE 10 MG/ML IJ SOLN
INTRAMUSCULAR | Status: DC | PRN
  Administered 2018-01-18: 5 mg via INTRAVENOUS

## 2018-01-18 MED ORDER — DEXTROSE 5 % IV SOLN
INTRAVENOUS | Status: DC | PRN
  Administered 2018-01-18: 25 ug/min via INTRAVENOUS

## 2018-01-18 MED ORDER — ROCURONIUM BROMIDE 10 MG/ML (PF) SYRINGE
PREFILLED_SYRINGE | INTRAVENOUS | Status: DC | PRN
  Administered 2018-01-18: 15 mg via INTRAVENOUS
  Administered 2018-01-18: 40 mg via INTRAVENOUS

## 2018-01-18 MED ORDER — SUGAMMADEX SODIUM 200 MG/2ML IV SOLN
INTRAVENOUS | Status: DC | PRN
Start: 2018-01-18 — End: 2018-01-18
  Administered 2018-01-18: 200 mg via INTRAVENOUS

## 2018-01-18 MED ORDER — LIDOCAINE 2% (20 MG/ML) 5 ML SYRINGE
INTRAMUSCULAR | Status: DC | PRN
  Administered 2018-01-18: 40 mg via INTRAVENOUS

## 2018-01-18 MED ORDER — CHLORHEXIDINE GLUCONATE CLOTH 2 % EX PADS: 6.0000 | MEDICATED_PAD | Freq: Once | CUTANEOUS | Status: DC

## 2018-01-18 MED ORDER — PROPOFOL 10 MG/ML IV BOLUS
INTRAVENOUS | Status: AC
  Filled 2018-01-18: qty 20

## 2018-01-18 MED ORDER — WARFARIN - PHYSICIAN DOSING INPATIENT: Freq: Every day | Status: DC

## 2018-01-18 MED ORDER — DILTIAZEM HCL 60 MG PO TABS
90.0000 mg | ORAL_TABLET | Freq: Every day | ORAL | Status: DC
  Administered 2018-01-18: 90 mg via ORAL
  Filled 2018-01-18: qty 2

## 2018-01-18 MED ORDER — ROSUVASTATIN CALCIUM 10 MG PO TABS
10.0000 mg | ORAL_TABLET | Freq: Every day | ORAL | Status: DC
  Administered 2018-01-18: 10 mg via ORAL
  Filled 2018-01-18: qty 1

## 2018-01-18 MED ORDER — PHENYLEPHRINE 40 MCG/ML (10ML) SYRINGE FOR IV PUSH (FOR BLOOD PRESSURE SUPPORT)
PREFILLED_SYRINGE | INTRAVENOUS | Status: DC | PRN
  Administered 2018-01-18: 160 ug via INTRAVENOUS
  Administered 2018-01-18: 80 ug via INTRAVENOUS

## 2018-01-18 MED ORDER — SODIUM CHLORIDE 0.9 % IJ SOLN
INTRAMUSCULAR | Status: AC
  Filled 2018-01-18: qty 20

## 2018-01-18 MED ORDER — FENTANYL CITRATE (PF) 250 MCG/5ML IJ SOLN
INTRAMUSCULAR | Status: AC
  Filled 2018-01-18: qty 5

## 2018-01-18 MED ORDER — HYDROMORPHONE HCL 2 MG/ML IJ SOLN
INTRAMUSCULAR | Status: AC
  Filled 2018-01-18: qty 1

## 2018-01-18 MED ORDER — ONDANSETRON HCL 4 MG/2ML IJ SOLN
INTRAMUSCULAR | Status: DC | PRN
  Administered 2018-01-18: 4 mg via INTRAVENOUS

## 2018-01-18 MED ORDER — ONDANSETRON HCL 4 MG/2ML IJ SOLN
INTRAMUSCULAR | Status: AC
  Filled 2018-01-18: qty 4

## 2018-01-18 MED ORDER — ROCURONIUM BROMIDE 50 MG/5ML IV SOLN
INTRAVENOUS | Status: AC
  Filled 2018-01-18: qty 1

## 2018-01-18 MED ORDER — EPHEDRINE SULFATE 50 MG/ML IJ SOLN
INTRAMUSCULAR | Status: AC
  Filled 2018-01-18: qty 1

## 2018-01-18 MED ORDER — FENTANYL CITRATE (PF) 250 MCG/5ML IJ SOLN
INTRAMUSCULAR | Status: DC | PRN
  Administered 2018-01-18: 100 ug via INTRAVENOUS

## 2018-01-18 MED ORDER — LIDOCAINE 2% (20 MG/ML) 5 ML SYRINGE
INTRAMUSCULAR | Status: AC
  Filled 2018-01-18: qty 5

## 2018-01-18 MED ORDER — HYDROMORPHONE HCL 2 MG/ML IJ SOLN
0.2500 mg | INTRAMUSCULAR | Status: DC | PRN
  Administered 2018-01-18: 0.5 mg via INTRAVENOUS

## 2018-01-18 MED ORDER — HEPARIN SODIUM (PORCINE) 5000 UNIT/ML IJ SOLN
5000.0000 [IU] | Freq: Three times a day (TID) | INTRAMUSCULAR | Status: DC
  Administered 2018-01-19: 5000 [IU] via SUBCUTANEOUS
  Filled 2018-01-18 (×2): qty 1

## 2018-01-18 MED ORDER — FAMOTIDINE IN NACL 20-0.9 MG/50ML-% IV SOLN
20.0000 mg | Freq: Two times a day (BID) | INTRAVENOUS | Status: DC
  Administered 2018-01-18 – 2018-01-19 (×2): 20 mg via INTRAVENOUS
  Filled 2018-01-18 (×2): qty 50

## 2018-01-18 MED ORDER — BUPIVACAINE-EPINEPHRINE (PF) 0.25% -1:200000 IJ SOLN
INTRAMUSCULAR | Status: AC
  Filled 2018-01-18: qty 30

## 2018-01-18 MED ORDER — PHENYLEPHRINE 40 MCG/ML (10ML) SYRINGE FOR IV PUSH (FOR BLOOD PRESSURE SUPPORT)
PREFILLED_SYRINGE | INTRAVENOUS | Status: AC
  Filled 2018-01-18: qty 10

## 2018-01-18 MED ORDER — MORPHINE SULFATE (PF) 4 MG/ML IV SOLN: 1.0000 mg | INTRAVENOUS | Status: DC | PRN

## 2018-01-18 MED ORDER — CEFAZOLIN SODIUM-DEXTROSE 2-4 GM/100ML-% IV SOLN
2.0000 g | INTRAVENOUS | Status: AC
  Administered 2018-01-18: 2 g via INTRAVENOUS

## 2018-01-18 MED ORDER — ONDANSETRON HCL 4 MG/2ML IJ SOLN: 4.0000 mg | Freq: Four times a day (QID) | INTRAMUSCULAR | Status: DC | PRN

## 2018-01-18 MED ORDER — ACETAMINOPHEN 500 MG PO TABS
1000.0000 mg | ORAL_TABLET | ORAL | Status: AC
  Administered 2018-01-18: 1000 mg via ORAL
  Filled 2018-01-18: qty 2

## 2018-01-18 MED ORDER — BUPIVACAINE-EPINEPHRINE 0.25% -1:200000 IJ SOLN
INTRAMUSCULAR | Status: DC | PRN
  Administered 2018-01-18: 18 mL

## 2018-01-18 MED ORDER — PROPOFOL 10 MG/ML IV BOLUS
INTRAVENOUS | Status: DC | PRN
  Administered 2018-01-18: 100 mg via INTRAVENOUS

## 2018-01-18 MED ORDER — LACTATED RINGERS IV SOLN
INTRAVENOUS | Status: DC
  Administered 2018-01-18: 14:00:00 via INTRAVENOUS

## 2018-01-18 MED ORDER — TORSEMIDE 20 MG PO TABS
20.0000 mg | ORAL_TABLET | Freq: Every day | ORAL | Status: DC
  Administered 2018-01-19: 20 mg via ORAL
  Filled 2018-01-18: qty 1

## 2018-01-18 MED ORDER — ONDANSETRON 4 MG PO TBDP: 4.0000 mg | ORAL_TABLET | Freq: Four times a day (QID) | ORAL | Status: DC | PRN

## 2018-01-18 MED ORDER — DEXAMETHASONE SODIUM PHOSPHATE 10 MG/ML IJ SOLN
INTRAMUSCULAR | Status: AC
  Filled 2018-01-18: qty 2

## 2018-01-18 SURGICAL SUPPLY — 39 items
BLADE CLIPPER SURG (BLADE) ×2 IMPLANT
BLADE SURG 10 STRL SS (BLADE) ×2 IMPLANT
BLADE SURG 15 STRL LF DISP TIS (BLADE) ×1 IMPLANT
BLADE SURG 15 STRL SS (BLADE) ×1
CHLORAPREP W/TINT 26ML (MISCELLANEOUS) ×2 IMPLANT
COTTONBALL LRG STERILE PKG (GAUZE/BANDAGES/DRESSINGS) IMPLANT
COVER SURGICAL LIGHT HANDLE (MISCELLANEOUS) ×2 IMPLANT
DERMABOND ADVANCED (GAUZE/BANDAGES/DRESSINGS) ×1
DERMABOND ADVANCED .7 DNX12 (GAUZE/BANDAGES/DRESSINGS) ×1 IMPLANT
DRAPE LAPAROSCOPIC ABDOMINAL (DRAPES) ×2 IMPLANT
DRAPE UTILITY XL STRL (DRAPES) ×2 IMPLANT
ELECT CAUTERY BLADE 6.4 (BLADE) ×2 IMPLANT
ELECT REM PT RETURN 9FT ADLT (ELECTROSURGICAL) ×2
ELECTRODE REM PT RTRN 9FT ADLT (ELECTROSURGICAL) ×1 IMPLANT
GAUZE SPONGE 4X4 12PLY STRL (GAUZE/BANDAGES/DRESSINGS) IMPLANT
GLOVE BIO SURGEON STRL SZ7.5 (GLOVE) ×2 IMPLANT
GOWN STRL REUS W/ TWL LRG LVL3 (GOWN DISPOSABLE) ×2 IMPLANT
GOWN STRL REUS W/TWL LRG LVL3 (GOWN DISPOSABLE) ×2
KIT BASIN OR (CUSTOM PROCEDURE TRAY) ×2 IMPLANT
KIT TURNOVER KIT B (KITS) ×2 IMPLANT
NEEDLE HYPO 25GX1X1/2 BEV (NEEDLE) ×2 IMPLANT
NS IRRIG 1000ML POUR BTL (IV SOLUTION) ×2 IMPLANT
PACK SURGICAL SETUP 50X90 (CUSTOM PROCEDURE TRAY) ×2 IMPLANT
PAD ARMBOARD 7.5X6 YLW CONV (MISCELLANEOUS) ×2 IMPLANT
PENCIL BUTTON HOLSTER BLD 10FT (ELECTRODE) ×2 IMPLANT
SPONGE LAP 18X18 X RAY DECT (DISPOSABLE) ×2 IMPLANT
SUT MNCRL AB 4-0 PS2 18 (SUTURE) ×2 IMPLANT
SUT NOVA NAB DX-16 0-1 5-0 T12 (SUTURE) ×2 IMPLANT
SUT PROLENE 0 CT 1 CR/8 (SUTURE) IMPLANT
SUT VIC AB 2-0 SH 27 (SUTURE) ×1
SUT VIC AB 2-0 SH 27X BRD (SUTURE) ×1 IMPLANT
SUT VIC AB 3-0 SH 27 (SUTURE) ×1
SUT VIC AB 3-0 SH 27XBRD (SUTURE) ×1 IMPLANT
SYR BULB 3OZ (MISCELLANEOUS) ×2 IMPLANT
SYR CONTROL 10ML LL (SYRINGE) ×2 IMPLANT
TOWEL OR 17X24 6PK STRL BLUE (TOWEL DISPOSABLE) ×2 IMPLANT
TOWEL OR 17X26 10 PK STRL BLUE (TOWEL DISPOSABLE) ×2 IMPLANT
TUBE CONNECTING 12X1/4 (SUCTIONS) ×2 IMPLANT
YANKAUER SUCT BULB TIP NO VENT (SUCTIONS) ×2 IMPLANT

## 2018-01-18 NOTE — Anesthesia Preprocedure Evaluation (Addendum)
Anesthesia Evaluation  Patient identified by MRN, date of birth, ID band Patient awake    Reviewed: Allergy & Precautions, H&P , NPO status , Patient's Chart, lab work & pertinent test results  Airway Mallampati: II  TM Distance: >3 FB Neck ROM: Full    Dental no notable dental hx. (+) Teeth Intact, Dental Advisory Given   Pulmonary sleep apnea ,    Pulmonary exam normal breath sounds clear to auscultation       Cardiovascular +CHF  + dysrhythmias Atrial Fibrillation + Cardiac Defibrillator  Rhythm:Regular Rate:Normal     Neuro/Psych Anxiety negative neurological ROS     GI/Hepatic negative GI ROS, Neg liver ROS,   Endo/Other  negative endocrine ROSdiabetes  Renal/GU negative Renal ROS  negative genitourinary   Musculoskeletal   Abdominal   Peds  Hematology negative hematology ROS (+)   Anesthesia Other Findings   Reproductive/Obstetrics negative OB ROS                            Anesthesia Physical Anesthesia Plan  ASA: III  Anesthesia Plan: General   Post-op Pain Management:    Induction: Intravenous  PONV Risk Score and Plan: 3 and Ondansetron, Dexamethasone and Treatment may vary due to age or medical condition  Airway Management Planned: Oral ETT  Additional Equipment:   Intra-op Plan:   Post-operative Plan: Extubation in OR  Informed Consent: I have reviewed the patients History and Physical, chart, labs and discussed the procedure including the risks, benefits and alternatives for the proposed anesthesia with the patient or authorized representative who has indicated his/her understanding and acceptance.   Dental advisory given  Plan Discussed with: CRNA  Anesthesia Plan Comments:         Anesthesia Quick Evaluation

## 2018-01-18 NOTE — Interval H&P Note (Signed)
History and Physical Interval Note:  01/18/2018 2:33 PM  Collin [name]  has presented today for surgery, with the diagnosis of UMBILICAL HERNIA  The various methods of treatment have been discussed with the patient and family. After consideration of risks, benefits and other options for treatment, the patient has consented to  Procedure(s): UMBILICAL HERNIA REPAIR WITH MESH (N/A) INSERTION OF MESH (N/A) as a surgical intervention .  The patient's history has been reviewed, patient examined, no change in status, stable for surgery.  I have reviewed the patient's chart and labs.  Questions were answered to the patient's satisfaction.     TOTH III,Tristain Daily S

## 2018-01-18 NOTE — Transfer of Care (Signed)
Immediate Anesthesia Transfer of Care Note  Patient: Collin [name]  Procedure(s) Performed: UMBILICAL HERNIA REPAIR WITH MESH (N/A Abdomen) INSERTION OF MESH (N/A Abdomen)  Patient Location: PACU  Anesthesia Type:General  Level of Consciousness: drowsy  Airway & Oxygen Therapy: Patient Spontanous Breathing and Patient connected to nasal cannula oxygen  Post-op Assessment: Report given to RN and Post -op Vital signs reviewed and stable  Post vital signs: Reviewed and stable  Last Vitals:  Vitals Value Taken Time  BP 121/75 01/18/2018  4:15 PM  Temp    Pulse 99 01/18/2018  4:17 PM  Resp 13 01/18/2018  4:17 PM  SpO2 100 % 01/18/2018  4:17 PM  Vitals shown include unvalidated device data.  Last Pain:  Vitals:   01/18/18 1322  TempSrc: Oral         Complications: No apparent anesthesia complications

## 2018-01-18 NOTE — H&P (Signed)
Collin [name]  Location: [facility] Surgery Patient #: [id number] DOB: 1940-11-16 Married / Language: English / Race: White Male   History of Present Illness  The patient is a 77 year old male who presents for a follow-up for Umbilical hernia. The patient is a 77 year old white male who has had an umbilical hernia for years. He has had several trips to the emergency department because of it getting temporarily obstructed. 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 He had a PEG tube placed for nutrition. He continues to have bulging at the umbilicus and has had to go to the emergency Department a couple times with bowel obstruction because of this. He is now ready to have this fixed. He states that his lingual cancer is in remission at this point. He was treated with radiation to the head and neck. He also has a defibrillator in place on his left chest wall because of atrial fibrillation. He is followed by Dr. Tenny Craw   Problem List/Past Medical UMBILICAL HERNIA WITHOUT OBSTRUCTION OR GANGRENE (K42.9)   Past Surgical History  Cataract Surgery  Bilateral. Open Inguinal Hernia Surgery  Bilateral.  Diagnostic Studies History Colonoscopy  >10 years ago  Allergies  Niaspan *ANTIHYPERLIPIDEMICS*  Allergies Reconciled   Medication History  Carvedilol (25MG  Tablet, Oral) Active. Digoxin ( Tablet, Oral) Active. DilTIAZem HCl (90MG  Tablet, Oral) Active. Doxycycline Hyclate (100MG  Capsule, Oral) Active. Furosemide (20MG  Tablet, Oral) Active. MetFORMIN HCl (500MG  Tablet, Oral) Active. Ondansetron (4MG  Tablet Disint, Oral) Active. Polyethylene Glycol 3350 (Oral) Active. Pravastatin Sodium (40MG  Tablet, Oral) Active. PredniSONE (20MG  Tablet, Oral) Active. Ramipril (5MG  Capsule, Oral) Active. Warfarin Sodium (5MG  Tablet, Oral) Active. Medications Reconciled  Social History   Alcohol use  Occasional alcohol use. Caffeine use  Coffee, Tea. No drug use  Tobacco use  Never smoker.  Family History Arthritis  Brother. Cerebrovascular Accident  Father. Depression  Brother. Heart Disease  Mother. Heart disease in male family member before age 69  Hypertension  Brother, Father.  Other Problems  Atrial Fibrillation  Cholelithiasis  Diabetes Mellitus  High blood pressure  Hypercholesterolemia  Melanoma  Sleep Apnea  Umbilical Hernia Repair  Vascular Disease     Review of Systems  General Present- Weight Loss. Not Present- Appetite Loss, Chills, Fatigue, Fever, Night Sweats and Weight Gain. Skin Not Present- Change in Wart/Mole, Dryness, Hives, Jaundice, New Lesions, Non-Healing Wounds, Rash and Ulcer. HEENT Present- Hearing Loss. Not Present- Earache, Hoarseness, Nose Bleed, Oral Ulcers, Ringing in the Ears, Seasonal Allergies, Sinus Pain, Sore Throat, Visual Disturbances, Wears glasses/contact lenses and Yellow Eyes. Respiratory Not Present- Bloody sputum, Chronic Cough, Difficulty Breathing, Snoring and Wheezing. Cardiovascular Present- Shortness of Breath and Swelling of Extremities. Not Present- Chest Pain, Difficulty Breathing Lying Down, Leg Cramps, Palpitations and Rapid Heart Rate. Gastrointestinal Present- Bloating, Constipation and Excessive gas. Not Present- Abdominal Pain, Bloody Stool, Change in Bowel Habits, Chronic diarrhea, Difficulty Swallowing, Gets full quickly at meals, Hemorrhoids, Indigestion, Nausea, Rectal Pain and Vomiting. Male Genitourinary Present- Impotence. Not Present- Blood in Urine, Change in Urinary Stream, Frequency, Nocturia, Painful Urination, Urgency and Urine Leakage.  Vitals Weight: 216.2 lb Height: 72in Body Surface Area: 2.2 m Body Mass Index: 29.32 kg/m  Temp.: 98.57F  Pulse: 86 (Regular)  BP: 116/72 (Sitting, Left Arm, Standard)       Physical Exam  General Mental  Status-Alert. General Appearance-Consistent with stated age. Hydration-Well hydrated. Voice-Normal.  Head and Neck  Head-normocephalic, atraumatic with no lesions or palpable masses. Trachea-midline. Thyroid Gland Characteristics - normal size and consistency.  Eye Eyeball - Bilateral-Extraocular movements intact. Sclera/Conjunctiva - Bilateral-No scleral icterus.  Chest and Lung Exam Chest and lung exam reveals -quiet, even and easy respiratory effort with no use of accessory muscles and on auscultation, normal breath sounds, no adventitious sounds and normal vocal resonance. Inspection Chest Wall - Normal. Back - normal. Note: There is a defibrillator in the left chest wall   Cardiovascular Cardiovascular examination reveals -normal heart sounds, regular rate and rhythm with no murmurs and normal pedal pulses bilaterally.  Abdomen Note: The abdomen is soft and nontender. There is a moderate-sized umbilical hernia that does reduce but is tender to palpation. There is a G-tube in place in the epigastric region.   Neurologic Neurologic evaluation reveals -alert and oriented x 3 with no impairment of recent or remote memory. Mental Status-Normal.  Musculoskeletal Normal Exam - Left-Upper Extremity Strength Normal and Lower Extremity Strength Normal. Normal Exam - Right-Upper Extremity Strength Normal and Lower Extremity Strength Normal.  Lymphatic Head & Neck  General Head & Neck Lymphatics: Bilateral - Description - Normal. Axillary  General Axillary Region: Bilateral - Description - Normal. Tenderness - Non Tender. Femoral & Inguinal  Generalized Femoral & Inguinal Lymphatics: Bilateral - Description - Normal. Tenderness - Non Tender.    Assessment & Plan  UMBILICAL HERNIA WITHOUT OBSTRUCTION OR GANGRENE (K42.9) Impression: The patient appears to have a small to moderate umbilical hernia that has become symptomatic for him. Because of  the risk of incarceration and strangulation and think he would benefit from having this fixed. He would also like to have this done. I would plan for an umbilical hernia repair with mesh. I have discussed with him in detail the risks and benefits of the operation as well as some of the technical aspects and he understands and wishes to proceed. Given his history of atrial fibrillation and having a defibrillator on his left chest wall I would recommend cardiac clearance prior to surgery. With his history of lingual cancer and radiation to the head and neck area he also will need clearance from his ENT doctor, Dr. Ezzard Standing. I would also plan to get a preoperative anesthesia consult for airway planning.

## 2018-01-18 NOTE — Anesthesia Postprocedure Evaluation (Signed)
Anesthesia Post Note  Patient: Collin [name]  Procedure(s) Performed: UMBILICAL HERNIA REPAIR WITH MESH (N/A Abdomen) INSERTION OF MESH (N/A Abdomen)     Patient location during evaluation: PACU Anesthesia Type: General Level of consciousness: awake and alert Pain management: pain level controlled Vital Signs Assessment: post-procedure vital signs reviewed and stable Respiratory status: spontaneous breathing, nonlabored ventilation and respiratory function stable Cardiovascular status: blood pressure returned to baseline and stable Postop Assessment: no apparent nausea or vomiting Anesthetic complications: no    Last Vitals:  Vitals:   01/18/18 1630 01/18/18 1645  BP: 111/67 110/74  Pulse: 67 74  Resp: 17 17  Temp:    SpO2: 100% 97%    Last Pain:  Vitals:   01/18/18 1645  TempSrc:   PainSc: 4                  Ronnell Clinger,W. EDMOND

## 2018-01-18 NOTE — Anesthesia Procedure Notes (Signed)
Procedure Name: Intubation Date/Time: 01/18/2018 3:04 PM Performed by: Imagene Riches, CRNA Pre-anesthesia Checklist: Patient identified, Emergency Drugs available, Suction available and Patient being monitored Patient Re-evaluated:Patient Re-evaluated prior to induction Oxygen Delivery Method: Circle System Utilized Preoxygenation: Pre-oxygenation with 100% oxygen Induction Type: IV induction Ventilation: Two handed mask ventilation required and Oral airway inserted - appropriate to patient size Laryngoscope Size: Sabra Heck and 2 Grade View: Grade II Tube type: Oral Tube size: 7.5 mm Number of attempts: 1 Airway Equipment and Method: Stylet and Oral airway Placement Confirmation: ETT inserted through vocal cords under direct vision,  positive ETCO2 and breath sounds checked- equal and bilateral Secured at: 23 cm Tube secured with: Tape Dental Injury: Teeth and Oropharynx as per pre-operative assessment  Comments: Large nose and beard contribute to difficult mask ventilation. Recommend miller 3 or mac 4 for future.

## 2018-01-18 NOTE — Op Note (Signed)
01/18/2018  4:05 PM  PATIENT:  Collin [name]  77 y.o. male  PRE-OPERATIVE DIAGNOSIS:  UMBILICAL HERNIA  POST-OPERATIVE DIAGNOSIS:  UMBILICAL HERNIA  PROCEDURE:  Procedure(s): UMBILICAL HERNIA REPAIR(PRIMARY)  SURGEON:  Surgeon(s) and Role:    * Jovita Kussmaul, MD - Primary  PHYSICIAN ASSISTANT:   ASSISTANTS: none   ANESTHESIA:   local and general  EBL:  20 mL   BLOOD ADMINISTERED:none  DRAINS: none   LOCAL MEDICATIONS USED:  MARCAINE     SPECIMEN:  Source of Specimen:  hernia sac  DISPOSITION OF SPECIMEN:  PATHOLOGY  COUNTS:  YES  TOURNIQUET:  * No tourniquets in log *  DICTATION: .Dragon Dictation   After informed consent was obtained the patient was brought to the operating room and placed in the supine position on the operating table.  After adequate induction of general anesthesia the patient's abdomen was prepped with ChloraPrep, allowed to dry, and draped in usual sterile manner.  An appropriate timeout was performed.  The area around the umbilicus was infiltrated with quarter percent Marcaine.  A vertically oriented incision was made along the midline through the hernia.  The incision was carried through the skin and subcutaneous tissue sharply with the electrocautery.  The dissection was carried all the way to the fascia of the abdominal wall.  The fascia was also incised with the electrocautery.  We were able to enter the abdominal cavity through the fascia.  We were then able to feel inferiorly into the hernia sac to confirm that there were no bowel contents within the hernia sac.  The hernia sac was then opened sharply with electrocautery.  There was some prominent fatty tissue within the hernia sac that raise the question as to whether this could be the superior wall of the bladder.  This was able to be reduced back into the abdominal cavity and prior to reducing it the it was reinforced with interrupted 3-0 Vicryl stitches.  The hernia sac itself was then  excised sharply with the electrocautery and sent to pathology for further evaluation.  The abdominal cavity was then palpated through the opening and no other hernias were identified.  There was also some unusual brown staining of the hernia sac but there was no obvious bowel involvement and no evidence of bowel perforation.  Because of this we decided not to close the hernia defect with mesh.  The fascial edges were otherwise healthy.  The hernia defect was then closed with interrupted #1 Novafil stitches.  The subcutaneous tissue was irrigated with copious amounts of saline.  The subcutaneous tissue was then closed with a running 3-0 Vicryl stitch.  The skin was then closed with a running 4-0 Monocryl subcuticular stitch.  Dermabond dressings were applied.  The patient tolerated the procedure well.  At the end of the case all needle sponge and instrument counts were correct.  The patient was then awakened and taken to recovery in stable condition.  PLAN OF CARE: Admit for overnight observation  PATIENT DISPOSITION:  PACU - hemodynamically stable.   Delay start of Pharmacological VTE agent (>24hrs) due to surgical blood loss or risk of bleeding: no

## 2018-01-19 ENCOUNTER — Encounter (HOSPITAL_COMMUNITY): Payer: Self-pay | Admitting: General Surgery

## 2018-01-19 ENCOUNTER — Other Ambulatory Visit: Payer: Self-pay

## 2018-01-19 DIAGNOSIS — Z7984 Long term (current) use of oral hypoglycemic drugs: Secondary | ICD-10-CM | POA: Diagnosis not present

## 2018-01-19 DIAGNOSIS — K429 Umbilical hernia without obstruction or gangrene: Secondary | ICD-10-CM | POA: Diagnosis not present

## 2018-01-19 DIAGNOSIS — E119 Type 2 diabetes mellitus without complications: Secondary | ICD-10-CM | POA: Diagnosis not present

## 2018-01-19 DIAGNOSIS — Z7901 Long term (current) use of anticoagulants: Secondary | ICD-10-CM | POA: Diagnosis not present

## 2018-01-19 DIAGNOSIS — Z79899 Other long term (current) drug therapy: Secondary | ICD-10-CM | POA: Diagnosis not present

## 2018-01-19 DIAGNOSIS — I4891 Unspecified atrial fibrillation: Secondary | ICD-10-CM | POA: Diagnosis not present

## 2018-01-19 DIAGNOSIS — I10 Essential (primary) hypertension: Secondary | ICD-10-CM | POA: Diagnosis not present

## 2018-01-19 DIAGNOSIS — G4730 Sleep apnea, unspecified: Secondary | ICD-10-CM | POA: Diagnosis not present

## 2018-01-19 DIAGNOSIS — E7800 Pure hypercholesterolemia, unspecified: Secondary | ICD-10-CM | POA: Diagnosis not present

## 2018-01-19 LAB — GLUCOSE, CAPILLARY: Glucose-Capillary: 143 mg/dL — ABNORMAL HIGH (ref 65–99)

## 2018-01-19 LAB — PROTIME-INR
INR: 1.43
Prothrombin Time: 17.3 seconds — ABNORMAL HIGH (ref 11.4–15.2)

## 2018-01-19 MED ORDER — HYDROCODONE-ACETAMINOPHEN 5-325 MG PO TABS: 1.0000 | ORAL_TABLET | Freq: Four times a day (QID) | ORAL | 0 refills | Status: DC | PRN

## 2018-01-19 MED ORDER — FAMOTIDINE 20 MG PO TABS: 20.0000 mg | ORAL_TABLET | Freq: Two times a day (BID) | ORAL | Status: DC

## 2018-01-19 NOTE — Progress Notes (Signed)
Pt is discharged to go home.  Discharge instructions and prescription given. 

## 2018-01-19 NOTE — Progress Notes (Signed)
1 Day Post-Op   Subjective/Chief Complaint: No complaints   Objective: Vital signs in last 24 hours: Temp:  [97.5 F (36.4 C)-98.3 F (36.8 C)] 98.3 F (36.8 C) (05/16 0605) Pulse Rate:  [66-91] 69 (05/16 0605) Resp:  [7-22] 22 (05/16 0605) BP: (97-124)/(65-86) 107/86 (05/16 0605) SpO2:  [90 %-100 %] 97 % (05/16 0605) Weight:  [91.6 kg (202 lb)-98.5 kg (217 lb 2.5 oz)] 98.5 kg (217 lb 2.5 oz) (05/15 2100)    Intake/Output from previous day: 05/15 0701 - 05/16 0700 In: 810 [I.V.:760; IV Piggyback:50] Out: 520 [Urine:500; Blood:20] Intake/Output this shift: No intake/output data recorded.  General appearance: alert and cooperative Resp: clear to auscultation bilaterally Cardio: regular rate and rhythm GI: soft, nontender  Lab Results:  Recent Labs    01/17/18 1011  WBC 3.2*  HGB 13.1  HCT 40.4  PLT 222   BMET Recent Labs    01/17/18 1011  NA 139  K 3.2*  CL 94*  CO2 32  GLUCOSE 107*  BUN 19  CREATININE 0.87  CALCIUM 9.0   PT/INR Recent Labs    01/18/18 1346 01/19/18 0519  LABPROT 17.3* 17.3*  INR 1.42 1.43   ABG No results for input([name]): PHART, HCO3 in the last 72 hours.  Invalid input([name]): PCO2, PO2  Studies/Results: No results found.  Anti-infectives: Anti-infectives (From admission, onward)   Start     Dose/Rate Route Frequency Ordered Stop   01/18/18 1145  ceFAZolin (ANCEF) IVPB 2g/100 mL premix     2 g 200 mL/hr over 30 Minutes Intravenous On call to O.R. 01/18/18 1132 01/18/18 1507      Assessment/Plan: [name]/p Procedure([name]): UMBILICAL HERNIA REPAIR WITH MESH (N/A) INSERTION OF MESH (N/A) Advance diet Discharge  LOS: 0 days    Collin [name] 01/19/2018

## 2018-01-23 ENCOUNTER — Ambulatory Visit: Payer: Self-pay

## 2018-01-27 DIAGNOSIS — C140 Malignant neoplasm of pharynx, unspecified: Secondary | ICD-10-CM | POA: Diagnosis not present

## 2018-01-27 DIAGNOSIS — I428 Other cardiomyopathies: Secondary | ICD-10-CM | POA: Diagnosis not present

## 2018-01-31 ENCOUNTER — Ambulatory Visit (INDEPENDENT_AMBULATORY_CARE_PROVIDER_SITE_OTHER): Payer: Medicare HMO | Admitting: Neurology

## 2018-01-31 ENCOUNTER — Other Ambulatory Visit: Payer: Self-pay

## 2018-01-31 ENCOUNTER — Encounter: Payer: Self-pay | Admitting: Neurology

## 2018-01-31 ENCOUNTER — Other Ambulatory Visit: Payer: Medicare HMO

## 2018-01-31 VITALS — BP 100/62 | HR 86 | Ht 72.0 in | Wt 204.0 lb

## 2018-01-31 DIAGNOSIS — Z85810 Personal history of malignant neoplasm of tongue: Secondary | ICD-10-CM

## 2018-01-31 DIAGNOSIS — G3184 Mild cognitive impairment, so stated: Secondary | ICD-10-CM

## 2018-01-31 MED ORDER — ESCITALOPRAM OXALATE 10 MG PO TABS: 10.0000 mg | ORAL_TABLET | Freq: Every day | ORAL | 11 refills | Status: DC

## 2018-01-31 NOTE — Patient Instructions (Addendum)
1. Bloodwork for TSH, B12  Your provider requests that you have LABS drawn today.  We share a lab with [facility] - they are located in [address] (second floor) of this building.  Once you get there, please have a seat and the phlebotomist will call your name.  If you have waited more than 15 minutes, please advise the front desk  2. Schedule CT head with and without contrast  We have sent a referral to [facility] for your CT and they will call you directly to schedule your appt. They are located at [place]. If you need to contact them directly please call [phone].   3. Start Lexapro 10mg  daily 4. After a month, call our office and we will send in a prescription for Donepezil (Aricept) 5. Follow-up in 6 months, call for any changes   RECOMMENDATIONS FOR ALL PATIENTS WITH MEMORY PROBLEMS: 1. Continue to exercise (Recommend 30 minutes of walking everyday, or 3 hours every week) 2. Increase social interactions - continue going to [place] and enjoy social gatherings with friends and family 3. Eat healthy, avoid fried foods and eat more fruits and vegetables 4. Maintain adequate blood pressure, blood sugar, and blood cholesterol level. Reducing the risk of stroke and cardiovascular disease also helps promoting better memory. 5. Avoid stressful situations. Live a simple life and avoid aggravations. Organize your time and prepare for the next day in anticipation. 6. Sleep well, avoid any interruptions of sleep and avoid any distractions in the bedroom that may interfere with adequate sleep quality 7. Avoid sugar, avoid sweets as there is a strong link between excessive sugar intake, diabetes, and cognitive impairment We discussed the Mediterranean diet, which has been shown to help patients reduce the risk of progressive memory disorders and reduces cardiovascular risk. This includes eating fish, eat fruits and green leafy vegetables, nuts like almonds and hazelnuts,  walnuts, and also use olive oil. Avoid fast foods and fried foods as much as possible. Avoid sweets and sugar as sugar use has been linked to worsening of memory function.  There is always a concern of gradual progression of memory problems. If this is the case, then we may need to adjust level of care according to patient needs. Support, both to the patient and caregiver, should then be put into place.

## 2018-01-31 NOTE — Progress Notes (Signed)
NEUROLOGY CONSULTATION NOTE  Collin [name] MRN: [id number] DOB: 01/08/1941  Referring provider: [name] Primary care provider: Dr. Evelena Peat  Reason for consult:  Concern for dementia  Dear Dr Tenny Craw:  Thank you for your kind referral of Collin [name] for consultation of the above symptoms. Although his history is well known to you, please allow me to reiterate it for the purpose of our medical record. The patient was accompanied to the clinic by his wife who also provides collateral information. Records and images were personally reviewed where available.  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 He started noticing changes around a year ago, soon after diagnosis of cancer. He describes his memory as "terrible," he would go upstairs and stop in the hallway forgetting what he went for. He could not find his eyeglasses and got very frustrated, berating himself for 40 minutes, calling himself stupid. His wife reports minor memory issues for a few years, but more noticeable in the past year. He used to manage his own medications, but at that time she would find full bottles of medications that should have been empty. She took over medications and now fixes his pills weekly. He states he does not drive as much and denies getting lost driving, his wife corrects him and reminds him he stopped driving November 2017 when he was having episodes with his ICD and was told not to drive. She feels he would have gotten lost, she always gives him instructions driving and he would panic if he did not know where he was. The other day at home he could not find their drinking glasses and was opening all the cabinets in the kitchen. He  repeats stories and questions. He is independent with dressing and bathing, but his wife is never sure if he brushes his teeth, he gets mad if she reminds him. She states "he is a good actor." She reports he is friendly to everyone, talking to strangers, but once they get into the car, "he is a grump, angry old man," taking it out on her all the time. She reports since the cancer diagnosis, things have been a lot worse, she thinks he is depressed. He states he is frustrated because he cannot do a lot of things he used to do.  He gets dizzy a lot when standing and has to get his bearings first. He has missteps and falls, he fell yesterday and had abrasions on his left arm. His vision is blurred. He has had swallowing difficulties since the surgery and radiation. He has a feeding tube but mostly is able to swallow by mouth now. No neck pain, some back discomfort. He gets left arm numbness when sitting for a long time. He has swelling on his left ankle, his wife reports it is "collapsed" for years, but he has never been evaluated for it. He has constipation, no incontinence, no anosmia or tremors. His father had memory issues. No history of significant head injuries or alcohol use. He finished up to the 9th grade.  PAST MEDICAL HISTORY: Past Medical History:  Diagnosis Date  . AICD (automatic cardioverter/defibrillator) present   . Atrial fibrillation (HCC)   . Cancer (HCC)    tongue cancer  . Cardiomyopathy, nonischemic (HCC) 1998  .  CHF (congestive heart failure) (HCC)   . Claustrophobia   . Diabetes mellitus   . Dysrhythmia   . Heart disease   . History of echocardiogram    Echo 8/18:  EF 55-60, no RWMA, severe LAE, normal RVSF, mild RAE  . History of radiation therapy 12/07/16- 01/24/17   Base of Tongue 70 Gy in 35 fractions  . Hyperlipidemia   . Memory loss   . OSA (obstructive sleep apnea)   . Shingles   . Umbilical hernia   . 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 It sometimes shows as a shadow on scans.     PAST SURGICAL HISTORY: Past Surgical History:  Procedure Laterality Date  . CARDIAC DEFIBRILLATOR PLACEMENT  2006  . CATARACT EXTRACTION Bilateral    01/07/2016. 01/22/2016  . CYST EXCISION     multiple drainage to a cyst in his neck.   Marland Kitchen DIRECT LARYNGOSCOPY N/A 11/16/2016   Procedure: DIRECT LARYNGOSCOPY AND BIOPSY;  Surgeon: [name], MD;  Location: [facility] OR;  Service: ENT;  Laterality: N/A;  . EYE SURGERY    . HERNIA REPAIR  1950s  . IMPLANTABLE CARDIOVERTER DEFIBRILLATOR (ICD) GENERATOR CHANGE N/A 05/10/2014   Procedure: ICD GENERATOR CHANGE;  Surgeon: [name], MD;  Location: [facility] CATH LAB;  Service: Cardiovascular;  Laterality: N/A;  . INSERTION OF MESH N/A 01/18/2018   Procedure: INSERTION OF MESH;  Surgeon: [name], MD;  Location: [facility] OR;  Service: General;  Laterality: N/A;  . IR GASTROSTOMY TUBE MOD SED  12/27/2016  . IR PATIENT EVAL TECH 0-60 MINS  01/17/2017  . MASS EXCISION Right 11/16/2016   Procedure: EXCISION RIGHT NECK LIPOMA;  Surgeon: [name], MD;  Location: [facility] OR;  Service: ENT;  Laterality: Right;  . MOHS SURGERY  01/01/2015   to top of head  . MOUTH SURGERY     teeth removed 4 teeth extracted 02/2010  . RADICAL NECK DISSECTION Right 07/25/2017   Procedure: RIGHT NECK DISSECTION;  Surgeon: [name], MD;  Location: [facility] OR;  Service: ENT;  Laterality: Right;  . UMBILICAL HERNIA REPAIR  01/18/2018   w/mesh  . UMBILICAL HERNIA REPAIR N/A 01/18/2018   Procedure: UMBILICAL HERNIA REPAIR WITH MESH;  Surgeon: [name], MD;  Location: [facility] OR;  Service: General;  Laterality: N/A;    MEDICATIONS: Current Outpatient Medications on File Prior to Visit  Medication Sig Dispense Refill  . acetaminophen (TYLENOL) 500 MG tablet Take 1,000 mg by mouth daily as needed for moderate pain.     . carvedilol (COREG) 25 MG tablet Take 25 mg by mouth twice daily 180  tablet 1  . digoxin (LANOXIN) 0.125 MG tablet Take 1 tablet (0.125 mg total) by mouth daily. (Patient taking differently: Take 0.0625 mg by mouth daily. ) 90 tablet 3  . diltiazem (CARDIZEM) 90 MG tablet Take 1 tablet (90 mg total) by mouth daily. (Patient taking differently: Take 90 mg by mouth at bedtime. ) 90 tablet 3  . guaifenesin (ROBITUSSIN) 100 MG/5ML syrup Take 200 mg by mouth at bedtime as needed for cough.    Marland Kitchen HYDROcodone-acetaminophen (NORCO/VICODIN) 5-325 MG tablet Take 1-2 tablets by mouth every 6 (six) hours as needed for moderate pain. 15 tablet 0  . Menthol, Topical Analgesic, (BENGAY EX) Apply 1 application topically daily as needed (pain).    . Nutritional Supplements (FEEDING SUPPLEMENT, OSMOLITE 1.5 CAL,) LIQD  Give 2 cans of formula at 8am and 12 noon. Give additional 1 1/2 cans at 4pm and 8pm feeding.   Flush with 60ml of water before and after feeding 4 times per day (Patient taking differently: Take 237 mLs by mouth 4 (four) times daily -  with meals and at bedtime. ) 1659 mL 0  . Nutritional Supplements (PROMOD) LIQD Give 30ml (1 oz) 2 times per day via tube. Flush with 60ml of water before and after given. 946 mL   . polyethylene glycol (MIRALAX) packet Take 17 g by mouth daily as needed. Available OTC (Patient taking differently: Take 17 g daily as needed by mouth for moderate constipation. ) 30 each 0  . potassium chloride (K-DUR) 10 MEQ tablet Dissolve 1 tablet in water and administer via PEG tube daily (Patient taking differently: Take 10 mEq by mouth See admin instructions. Dissolve 10 meq in water and administer via PEG tube daily at night) 90 tablet 3  . rosuvastatin (CRESTOR) 10 MG tablet Take 1 tablet (10 mg total) by mouth daily. (Patient taking differently: Take 10 mg by mouth at bedtime. ) 90 tablet 3  . sodium fluoride (PREVIDENT 5000 PLUS) 1.1 % CREA dental cream Apply gel to tooth brush. Brush teeth for 2 minutes. Spit out excess-DO NOT swallow. Repeat nightly.  (Patient taking differently: Place 1 application onto teeth at bedtime. Apply gel to tooth brush. Brush teeth for 2 minutes. Spit out excess-DO NOT swallow. Repeat nightly.) 1 Tube prn  . torsemide (DEMADEX) 20 MG tablet Take 1 tablet (20 mg total) by mouth daily. (Patient taking differently: Take 20-40 mg by mouth See admin instructions. Take 20 mg by mouth once daily except on Tuesday and Saturday take 40 mg once daily) 90 tablet 3  . warfarin (COUMADIN) 5 MG tablet Take 1-1.5 tablets (5-7.5 mg total) by mouth See admin instructions. TAKE AS DIRECTED BY ANTICOAGULATION CLINIC. Take 5 mg daily except Wednesday and Saturday take 7.5 mg normally  **For the next 3 days take 7.5 mg Daily and go back to regular schedule 3/18. Follow up with Coumadin Clinic Monday for INR Check (Patient taking differently: Take 5-7.5 mg by mouth See admin instructions. Take 5mg  once daily at night except on Wednesdays take 7.5 mg at night)     No current facility-administered medications on file prior to visit.     ALLERGIES: Allergies  Allergen Reactions  . Niaspan [Niacin Er] Other (See Comments)    FLUSHED    FAMILY HISTORY: Family History  Problem Relation Age of Onset  . Heart disease Mother   . Sudden death Mother        under age 75  . Lupus Mother   . Hypertension Father   . Stroke Father     SOCIAL HISTORY: Social History   Socioeconomic History  . Marital status: Married    Spouse name: [name]  . Number of children: 3  . Years of education: Not on file  . Highest education level: Not on file  Occupational History  . Occupation: Retired Engineer, materials  Social Needs  . Financial resource strain: Not on file  . Food insecurity:    Worry: Not on file    Inability: Not on file  . Transportation needs:    Medical: Not on file    Non-medical: Not on file  Tobacco Use  . Smoking status: Never Smoker  . Smokeless tobacco: Never Used  Substance and Sexual Activity  . Alcohol use: Yes  Comment: "a little wine every once in a while" 01/17/2018  . Drug use: No  . Sexual activity: Not on file  Lifestyle  . Physical activity:    Days per week: Not on file    Minutes per session: Not on file  . Stress: Not on file  Relationships  . Social connections:    Talks on phone: Not on file    Gets together: Not on file    Attends religious service: Not on file    Active member of club or organization: Not on file    Attends meetings of clubs or organizations: Not on file    Relationship status: Not on file  . Intimate partner violence:    Fear of current or ex partner: Not on file    Emotionally abused: Not on file    Physically abused: Not on file    Forced sexual activity: Not on file  Other Topics Concern  . Not on file  Social History Narrative  . Not on file    REVIEW OF SYSTEMS: Constitutional: No fevers, chills, or sweats, no generalized fatigue, change in appetite Eyes: No visual changes, double vision, eye pain Ear, nose and throat: No hearing loss, ear pain, nasal congestion, sore throat Cardiovascular: No chest pain, palpitations Respiratory:  No shortness of breath at rest or with exertion, wheezes GastrointestinaI: No nausea, vomiting, diarrhea, abdominal pain, fecal incontinence Genitourinary:  No dysuria, urinary retention or frequency Musculoskeletal:  No neck pain, +back pain Integumentary: No rash, pruritus, skin lesions Neurological: as above Psychiatric: + depression, no insomnia, anxiety Endocrine: No palpitations, fatigue, diaphoresis, mood swings, change in appetite, change in weight, increased thirst Hematologic/Lymphatic:  No anemia, purpura, petechiae. Allergic/Immunologic: no itchy/runny eyes, nasal congestion, recent allergic reactions, rashes  PHYSICAL EXAM: Vitals:   01/31/18 1411  BP: 100/62  Pulse: 86  SpO2: 97%   General: No acute distress Head:  Normocephalic/atraumatic Eyes: Fundoscopic exam shows bilateral sharp discs, no  vessel changes, exudates, or hemorrhages Neck: s/p surgery on right side, no paraspinal tenderness, full range of motion Back: No paraspinal tenderness Heart: regular rate and rhythm Lungs: Clear to auscultation bilaterally. Vascular: No carotid bruits. Skin/Extremities: No rash, +left ankle pitting edema with left foot externally rotated Neurological Exam: Mental status: alert and oriented to person, place, and month/year/season, no dysarthria or aphasia, Fund of knowledge is appropriate.  Recent and remote memory are impaired.  Attention and concentration are normal.  He kept saying he does not write or spell, but was able to spell WORLD forward and backward.  Able to name objects and repeat phrases. CDT 4/5 MMSE - Mini Mental State Exam 01/31/2018  Orientation to time 3  Orientation to Place 5  Registration 3  Attention/ Calculation 5  Recall 1  Language- name 2 objects 2  Language- repeat 1  Language- follow 3 step command 2  Language- read & follow direction 1  Write a sentence 1  Copy design 1  Total score 25   Cranial nerves: CN I: not tested CN II: pupils equal, round and reactive to light, visual fields intact, fundi unremarkable. CN III, IV, VI:  full range of motion, no nystagmus, no ptosis CN V: facial sensation intact CN VII: upper and lower face symmetric CN VIII: hearing intact to finger rub CN IX, X: gag intact, uvula midline CN XI: sternocleidomastoid and trapezius muscles intact CN XII: tongue midline Bulk & Tone: normal, no fasciculations. Motor: 5/5 throughout except for 2/5 left foot adduction, no  pronator drift. Sensation: intact to light touch, cold, pin on both UE, decreased cold on right calf, decreased vibration to knees bilaterally.  No extinction to double simultaneous stimulation.  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 Neurological exam shows an externally rotated left foot (chronic per patient but they report no evaluation for cause), MMSE 25/30. 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 His wife is reporting significant mood changes and depression, we discussed how mood can affect memory, he is agreeable to starting Lexapro 10mg  daily, side effects were discussed. Check TSH and B12. A head CT with and without contrast will be ordered to assess for underlying structural abnormality. He is unable to have an MRI due to AICD. We discussed that a month after starting Lexapro, he should call our office at which point we will start Donepezil. Side effects were discussed. We also discussed the importance of physical exercise, brain stimulation exercises, and control of vascular risk factors for brain health. He will follow-up in 6 months and knows to call for any changes.   Thank you for allowing me to participate in the care of this patient. Please do not hesitate to call for any questions or concerns.   Patrcia Dolly, M.D.  CC: Dr. Caryl Never, Dr. Tenny Craw

## 2018-02-01 ENCOUNTER — Ambulatory Visit (INDEPENDENT_AMBULATORY_CARE_PROVIDER_SITE_OTHER): Payer: Medicare HMO | Admitting: General Practice

## 2018-02-01 DIAGNOSIS — I480 Paroxysmal atrial fibrillation: Secondary | ICD-10-CM | POA: Diagnosis not present

## 2018-02-01 DIAGNOSIS — Z7901 Long term (current) use of anticoagulants: Secondary | ICD-10-CM

## 2018-02-01 LAB — POCT INR: INR: 2.2 (ref 2.0–3.0)

## 2018-02-01 LAB — TSH: TSH: 0.79 mIU/L (ref 0.40–4.50)

## 2018-02-01 LAB — VITAMIN B12: Vitamin B-12: 441 pg/mL (ref 200–1100)

## 2018-02-01 NOTE — Patient Instructions (Addendum)
Pre visit review using our clinic review tool, if applicable. No additional management support is needed unless otherwise documented below in the visit note.  Continue to take 1 tablet daily except 1 1/2 on Wednesdays only.  Re-check in 4 weeks.

## 2018-02-10 ENCOUNTER — Encounter (INDEPENDENT_AMBULATORY_CARE_PROVIDER_SITE_OTHER): Payer: Medicare HMO | Admitting: Ophthalmology

## 2018-02-10 DIAGNOSIS — H26492 Other secondary cataract, left eye: Secondary | ICD-10-CM | POA: Diagnosis not present

## 2018-02-20 ENCOUNTER — Encounter (INDEPENDENT_AMBULATORY_CARE_PROVIDER_SITE_OTHER): Payer: Medicare HMO | Admitting: Ophthalmology

## 2018-02-20 DIAGNOSIS — H2702 Aphakia, left eye: Secondary | ICD-10-CM

## 2018-02-27 DIAGNOSIS — C140 Malignant neoplasm of pharynx, unspecified: Secondary | ICD-10-CM | POA: Diagnosis not present

## 2018-02-27 DIAGNOSIS — I428 Other cardiomyopathies: Secondary | ICD-10-CM | POA: Diagnosis not present

## 2018-03-01 ENCOUNTER — Ambulatory Visit (INDEPENDENT_AMBULATORY_CARE_PROVIDER_SITE_OTHER): Payer: Medicare HMO | Admitting: General Practice

## 2018-03-01 DIAGNOSIS — Z7901 Long term (current) use of anticoagulants: Secondary | ICD-10-CM | POA: Diagnosis not present

## 2018-03-01 DIAGNOSIS — I480 Paroxysmal atrial fibrillation: Secondary | ICD-10-CM

## 2018-03-01 LAB — POCT INR: INR: 1.7 — AB (ref 2.0–3.0)

## 2018-03-01 NOTE — Progress Notes (Signed)
Collin [name] presents for follow up of radiation to his Base of Tongue completed 01/24/17.   Pain issues, if any: He reports generalized pain to his right arm when moving.  Using a feeding tube?: Yes,  Weight changes, if any:  Swallowing issues, if any: He has difficulty with meat and raw vegetables. He drinks water with meals to help him swallow.  Smoking or chewing tobacco? No Using fluoride trays daily? No Last ENT visit was on: Dr. Lucia Gaskins 12/2017. Other notable issues, if any:  He continues to report thick mucous especially at night.  1/69/45 Umbilical Hernia repair per Dr. Marlou Starks.  01/31/18 Neurology Consult - memory difficulties- CT head 03/02/18.  He reports a depressed mood related to his memory loss and other life events.   BP 99/65   Pulse (!) 59   Temp 97.8 F (36.6 C)   Resp 18   Ht 6' (1.829 m)   Wt 204 lb 3.2 oz (92.6 kg)   SpO2 96%   BMI 27.69 kg/m

## 2018-03-01 NOTE — Patient Instructions (Addendum)
Pre visit review using our clinic review tool, if applicable. No additional management support is needed unless otherwise documented below in the visit note.  Take 2 tablets today and then continue to take 1 tablet daily except 1 1/2 on Wednesdays only.  Re-check in 4 weeks.

## 2018-03-02 ENCOUNTER — Other Ambulatory Visit: Payer: Self-pay

## 2018-03-03 ENCOUNTER — Encounter: Payer: Self-pay | Admitting: Internal Medicine

## 2018-03-03 ENCOUNTER — Ambulatory Visit: Payer: Medicare HMO | Admitting: Internal Medicine

## 2018-03-03 ENCOUNTER — Ambulatory Visit
Admission: RE | Admit: 2018-03-03 | Discharge: 2018-03-03 | Disposition: A | Discharge status: Home / Self Care | Payer: Medicare HMO | Source: Ambulatory Visit | Attending: Radiation Oncology | Admitting: Radiation Oncology

## 2018-03-03 ENCOUNTER — Encounter: Payer: Self-pay | Admitting: Radiation Oncology

## 2018-03-03 ENCOUNTER — Encounter: Payer: Self-pay | Admitting: *Deleted

## 2018-03-03 ENCOUNTER — Other Ambulatory Visit: Payer: Self-pay

## 2018-03-03 VITALS — BP 126/58 | HR 68 | Ht 72.0 in | Wt 205.0 lb

## 2018-03-03 VITALS — BP 99/65 | HR 59 | Temp 97.8°F | Resp 18 | Ht 72.0 in | Wt 204.2 lb

## 2018-03-03 DIAGNOSIS — I2510 Atherosclerotic heart disease of native coronary artery without angina pectoris: Secondary | ICD-10-CM | POA: Diagnosis not present

## 2018-03-03 DIAGNOSIS — Z931 Gastrostomy status: Secondary | ICD-10-CM | POA: Diagnosis not present

## 2018-03-03 DIAGNOSIS — Z923 Personal history of irradiation: Secondary | ICD-10-CM | POA: Diagnosis not present

## 2018-03-03 DIAGNOSIS — N179 Acute kidney failure, unspecified: Secondary | ICD-10-CM | POA: Diagnosis not present

## 2018-03-03 DIAGNOSIS — C01 Malignant neoplasm of base of tongue: Secondary | ICD-10-CM

## 2018-03-03 DIAGNOSIS — I428 Other cardiomyopathies: Secondary | ICD-10-CM | POA: Diagnosis not present

## 2018-03-03 DIAGNOSIS — Z08 Encounter for follow-up examination after completed treatment for malignant neoplasm: Secondary | ICD-10-CM | POA: Diagnosis not present

## 2018-03-03 DIAGNOSIS — I5032 Chronic diastolic (congestive) heart failure: Secondary | ICD-10-CM | POA: Diagnosis not present

## 2018-03-03 DIAGNOSIS — Z85810 Personal history of malignant neoplasm of tongue: Secondary | ICD-10-CM | POA: Insufficient documentation

## 2018-03-03 DIAGNOSIS — Z95810 Presence of automatic (implantable) cardiac defibrillator: Secondary | ICD-10-CM | POA: Diagnosis not present

## 2018-03-03 DIAGNOSIS — I481 Persistent atrial fibrillation: Secondary | ICD-10-CM | POA: Diagnosis not present

## 2018-03-03 DIAGNOSIS — I4819 Other persistent atrial fibrillation: Secondary | ICD-10-CM

## 2018-03-03 DIAGNOSIS — M79601 Pain in right arm: Secondary | ICD-10-CM | POA: Insufficient documentation

## 2018-03-03 DIAGNOSIS — R1310 Dysphagia, unspecified: Secondary | ICD-10-CM | POA: Diagnosis not present

## 2018-03-03 LAB — BASIC METABOLIC PANEL
BUN/Creatinine Ratio: 26 — ABNORMAL HIGH (ref 10–24)
BUN: 18 mg/dL (ref 8–27)
CO2: 27 mmol/L (ref 20–29)
Calcium: 9.2 mg/dL (ref 8.6–10.2)
Chloride: 100 mmol/L (ref 96–106)
Creatinine, Ser: 0.69 mg/dL — ABNORMAL LOW (ref 0.76–1.27)
GFR calc Af Amer: 106 mL/min/{1.73_m2} (ref >59)
GFR calc non Af Amer: 92 mL/min/{1.73_m2} (ref >59)
Glucose: 80 mg/dL (ref 65–99)
Potassium: 4.5 mmol/L (ref 3.5–5.2)
Sodium: 142 mmol/L (ref 134–144)

## 2018-03-03 NOTE — Progress Notes (Signed)
Radiation Oncology         [phone] ________________________________  Name: Collin [name] MRN: [id number]  Date: 03/03/2018  DOB: 10/11/1940  Follow-Up Visit Note  CC: [name], MD  [name], *  Diagnosis and Prior Radiotherapy:       ICD-10-CM   1. 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   Pain issues, if any: He reports generalized pain to his right arm when moving.  Using a feeding tube?: No - wants it removed Weight changes, if any:   Wt Readings from Last 3 Encounters:  03/03/18 204 lb 3.2 oz (92.6 kg)  03/03/18 205 lb (93 kg)  01/31/18 204 lb (92.5 kg)    Swallowing issues, if any: He has difficulty with meat and raw vegetables. He drinks water with meals to help him swallow.  Smoking or chewing tobacco? No Using fluoride trays daily? No Last ENT visit was on: Dr. Ezzard Standing 12/2017. Other notable issues, if any:  He continues to report thick mucous especially at night.  01/18/18 Umbilical Hernia repair per Dr. Carolynne Edouard.  01/31/18 Neurology Consult - memory difficulties- CT head 03/02/18.  He reports a depressed mood related to his gradual memory loss and other life events.   Anger management issues.  Wife agrees.       ALLERGIES:  is allergic to niaspan [niacin er].  Meds: Current Outpatient Medications  Medication Sig Dispense Refill  . acetaminophen (TYLENOL) 500 MG tablet Take 1,000 mg by mouth daily as needed for moderate pain.     . carvedilol (COREG) 25 MG tablet Take 25 mg by mouth twice daily 180 tablet 1  . diltiazem  (CARDIZEM) 90 MG tablet Take 1 tablet (90 mg total) by mouth daily. (Patient taking differently: Take 90 mg by mouth at bedtime. ) 90 tablet 3  . escitalopram (LEXAPRO) 10 MG tablet Take 1 tablet (10 mg total) by mouth daily. 30 tablet 11  . polyethylene glycol (MIRALAX) packet Take 17 g by mouth daily as needed. Available OTC (Patient taking differently: Take 17 g daily as needed by mouth for moderate constipation. ) 30 each 0  . potassium chloride (K-DUR) 10 MEQ tablet Dissolve 1 tablet in water and administer via PEG tube daily (Patient taking differently: Take 10 mEq by mouth See admin instructions. Dissolve 10 meq in water and administer via PEG tube daily at night) 90 tablet 3  . sodium fluoride (PREVIDENT 5000 PLUS) 1.1 % CREA dental cream Apply gel to tooth brush. Brush teeth for 2 minutes. Spit out excess-DO NOT swallow. Repeat nightly. (Patient taking differently: Place 1 application onto teeth at bedtime. Apply gel to tooth brush. Brush teeth for 2 minutes. Spit out excess-DO NOT swallow. Repeat nightly.) 1 Tube prn  . torsemide (DEMADEX) 20 MG tablet Take 20-40 mg by mouth as directed. 20mg  daily by by mouth except Tuesday and Saturday take 40 mg.    . warfarin (COUMADIN) 5 MG tablet Take 1-1.5 tablets (5-7.5 mg total) by mouth See admin instructions. TAKE AS DIRECTED BY ANTICOAGULATION CLINIC. Take  5 mg daily except Wednesday and Saturday take 7.5 mg normally  **For the next 3 days take 7.5 mg Daily and go back to regular schedule 3/18. Follow up with Coumadin Clinic Monday for INR Check (Patient taking differently: Take 5-7.5 mg by mouth See admin instructions. Take 5mg  once daily at night except on Wednesdays take 7.5 mg at night)    . guaifenesin (ROBITUSSIN) 100 MG/5ML syrup Take 200 mg by mouth at bedtime as needed for cough.    . Nutritional Supplements (FEEDING SUPPLEMENT, OSMOLITE 1.5 CAL,) LIQD Give 2 cans of formula at 8am and 12 noon. Give additional 1 1/2 cans at 4pm and 8pm  feeding.   Flush with 60ml of water before and after feeding 4 times per day (Patient not taking: Reported on 03/03/2018) 1659 mL 0  . rosuvastatin (CRESTOR) 10 MG tablet Take 1 tablet (10 mg total) by mouth daily. (Patient taking differently: Take 10 mg by mouth at bedtime. ) 90 tablet 3   No current facility-administered medications for this encounter.     Physical Findings: Wt Readings from Last 3 Encounters:  03/03/18 204 lb 3.2 oz (92.6 kg)  03/03/18 205 lb (93 kg)  01/31/18 204 lb (92.5 kg)    height is 6' (1.829 m) and weight is 204 lb 3.2 oz (92.6 kg). His temperature is 97.8 F (36.6 C). His blood pressure is 99/65 and his pulse is 59 (abnormal). His respiration is 18 and oxygen saturation is 96%.   General: Alert and oriented, in no acute distress. HEENT: Head is normocephalic. Oral cavity and oropharynx are clear. Mild lymphedema in the right cheek. Neck: Neck is notable for lymphedema in his anterior and right cervical neck but no palpable lymphadenopathy. Skin: Skin in treatment fields shows satisfactory healing. Pigment change over the skin of his left neck. Heart: Regular in rate and rhythm with no murmurs, rubs, or gallops. Chest: Clear to auscultation bilaterally, with no rhonchi, wheezes, or rales. Psychiatric:  Affect is appropriate. MSK:  uses cane to walk Abd: + PEG tube Extremities: Chronic edema in bilateral ankles.  Lab Findings: Lab Results  Component Value Date   WBC 3.2 (L) 01/17/2018   HGB 13.1 01/17/2018   HCT 40.4 01/17/2018   MCV 92.0 01/17/2018   PLT 222 01/17/2018    Lab Results  Component Value Date   TSH 0.79 01/31/2018    Radiographic Findings: No results found.  Impression/Plan:    1) Head and Neck Cancer Status: NED  2) Nutritional Status/PEG tube:  Patient has not used PEG tube in several weeks. Wants it removed. We will schedule for removal in the near future.  3) Swallowing: He has some difficulty with swallowing. He saw the  swallowing therapist but is not compliant with the exercises given.   4) Thyroid function:  WNL Lab Results  Component Value Date   TSH 0.79 01/31/2018    5) Other: Referral of patient and wife to Dr. Clelia Croft at [facility] counseling for memory loss, emotional issues - their services may help his wife as a caregiver, too.   6) Follow-up with Dr. Ezzard Standing on 06/07/18 as planned. Follow-up with myself in 8 months. The patient was encouraged to call with any issues or questions before then.  In a face to face visit lasting over 25 minutes, greater than 50% of the time was spent  on counseling and coordinating the patient's care.   _____________________________________   Lonie Peak, MD  This document serves as a record  of services personally performed by Lonie Peak, MD. It was created on her behalf by Tawni Levy, a trained medical scribe. The creation of this record is based on the scribe's personal observations and the provider's statements to them. This document has been checked and approved by the attending provider.

## 2018-03-03 NOTE — Progress Notes (Signed)
Cardiology Office Note:    Date:  03/03/2018   ID:  Collin [name], DOB 11-Dec-1940, MRN [id number]  PCP:  Collin Covey, MD  Cardiologist:  [name]   Electrophysiologist: [name]    Referring MD: Collin Covey, MD   F/U of chronic systolic CHF and chronic atrial fib  History of Present Illness:    Collin [name] is a 77 y.o. male with a hx of  NICM, systolic heart failure, atrial fibrillation, diabetes, hyperlipidemia, sleep apnea.   He has a history of normal coronary arteries by cardiac catheterization in 2002. Prior EF was 28%. He is status post ICD and is followed by Dr. Ladona [name].  Echocardiogram in 2015 demonstrated normal LV function with an EF of 55-60%.  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  He has a feeding tube. He was seen by Collin [name] in October  Diuretic adjusted    I saw the pt in March   Since seen he has been seen by Collin [name] for AICD   He has also been seen by Dr Collin [name] in Neuro   Pt concerned about memory  He says his breathing is stable   He denise CP     Edema has improved in legs  now that he is sleeping in bed  No dizziness  Unsteady but takes time    Prior CV studies:   The following studies were reviewed today:  PET Scan 05/17/17 IMPRESSION: 1. Response to therapy. 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 2. No evidence of extracervical metastatic disease. 3. Heterogeneous hepatic activity is nonspecific. Concurrent caudate lobe enlargement and gynecomastia. Correlate with risk factors for cirrhosis. Consider dedicated abdominal imaging, ideally with pre and post contrast abdominal MRI. 4. Cardiomegaly with suspicion of elevated right heart pressures. Pulmonary artery enlargement suggests pulmonary arterial hypertension. 5. Coronary artery atherosclerosis. Aortic Atherosclerosis (ICD10-I70.0). 6. Cholelithiasis. 7. Prostatomegaly with bladder  outlet obstruction. 8. Fat containing ventral pelvic wall hernia. 9. Pulmonary artery enlargement suggests pulmonary arterial Hypertension.  Echocardiogram 04/28/17 EF 55-60, normal wall motion, severe LAE, normal RVSF, mild RAE  LE venous US 03/24/17 No evidence of lower extremity deep or superficial venous thrombus or incompetence, bilaterally.  Echo 04/22/14 Mod conc LVH, EF 55-60, no RWMA, mildly dilated ascending aorta (root 40 mm, ascending aorta 37 mm), severe LAE, severe RAE, lipoma hypertrophy of the Atrial septum, mild TR  Echo 04/17/13 Moderate LVH, EF 35-40, diffuse HK, moderate to severe LAE, mild RAE  Echo 5/12 Moderate LVH, EF 50-55, moderate LAE, mild RAE  Past Medical History:  Diagnosis Date  . AICD (automatic cardioverter/defibrillator) present   . Atrial fibrillation (HCC)   . Cancer (HCC)    tongue cancer  . Cardiomyopathy, nonischemic (HCC) 1998  . CHF (congestive heart failure) (HCC)   . Claustrophobia   . Diabetes mellitus   . Dysrhythmia   . Heart disease   . History of echocardiogram    Echo 8/18:  EF 55-60, no RWMA, severe LAE, normal RVSF, mild RAE  . History of radiation therapy 12/07/16- 01/24/17   Base of Tongue 70 Gy in 35 fractions  . Hyperlipidemia   . Memory loss   . OSA (obstructive sleep apnea)   . Shingles   . Umbilical hernia   . 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 It sometimes shows  as a shadow on scans.     Past Surgical History:  Procedure Laterality Date  . CARDIAC DEFIBRILLATOR PLACEMENT  2006  . CATARACT EXTRACTION Bilateral    01/07/2016. 01/22/2016  . CYST EXCISION     multiple drainage to a cyst in his neck.   Marland Kitchen DIRECT LARYNGOSCOPY N/A 11/16/2016   Procedure: DIRECT LARYNGOSCOPY AND BIOPSY;  Surgeon: Collin Halon, MD;  Location[facility] OR;  Service: ENT;  Laterality: N/A;  . EYE SURGERY    . HERNIA REPAIR  1950s  . IMPLANTABLE CARDIOVERTER DEFIBRILLATOR (ICD)  GENERATOR CHANGE N/A 05/10/2014   Procedure: ICD GENERATOR CHANGE;  Surgeon: Collin Maw, MD;  Location: [facility] CATH LAB;  Service: Cardiovascular;  Laterality: N/A;  . INSERTION OF MESH N/A 01/18/2018   Procedure: INSERTION OF MESH;  Surgeon: Collin Miner, MD;  Location: [facility] OR;  Service: General;  Laterality: N/A;  . IR GASTROSTOMY TUBE MOD SED  12/27/2016  . IR PATIENT EVAL TECH 0-60 MINS  01/17/2017  . MASS EXCISION Right 11/16/2016   Procedure: EXCISION RIGHT NECK LIPOMA;  Surgeon: Collin Halon, MD;  Location: [facility] OR;  Service: ENT;  Laterality: Right;  . MOHS SURGERY  01/01/2015   to top of head  . MOUTH SURGERY     teeth removed 4 teeth extracted 02/2010  . RADICAL NECK DISSECTION Right 07/25/2017   Procedure: RIGHT NECK DISSECTION;  Surgeon: Collin Halon, MD;  Location[facility] OR;  Service: ENT;  Laterality: Right;  . UMBILICAL HERNIA REPAIR  01/18/2018   w/mesh  . UMBILICAL HERNIA REPAIR N/A 01/18/2018   Procedure: UMBILICAL HERNIA REPAIR WITH MESH;  Surgeon: Collin Pretty III, MD;  Location: [facility] OR;  Service: General;  Laterality: N/A;    Current Medications: Current Meds  Medication Sig  . acetaminophen (TYLENOL) 500 MG tablet Take 1,000 mg by mouth daily as needed for moderate pain.   . carvedilol (COREG) 25 MG tablet Take 25 mg by mouth twice daily  . digoxin (DIGOX) 0.125 MG tablet Take 0.0625 mg by mouth daily.  Marland Kitchen diltiazem (CARDIZEM) 90 MG tablet Take 1 tablet (90 mg total) by mouth daily. (Patient taking differently: Take 90 mg by mouth at bedtime. )  . escitalopram (LEXAPRO) 10 MG tablet Take 1 tablet (10 mg total) by mouth daily.  Marland Kitchen guaifenesin (ROBITUSSIN) 100 MG/5ML syrup Take 200 mg by mouth at bedtime as needed for cough.  . Nutritional Supplements (FEEDING SUPPLEMENT, OSMOLITE 1.5 CAL,) LIQD Give 2 cans of formula at 8am and 12 noon. Give additional 1 1/2 cans at 4pm and 8pm feeding.   Flush with 60ml of water before and after feeding 4 times per day (Patient  taking differently: Take 237 mLs by mouth 4 (four) times daily -  with meals and at bedtime. )  . polyethylene glycol (MIRALAX) packet Take 17 g by mouth daily as needed. Available OTC (Patient taking differently: Take 17 g daily as needed by mouth for moderate constipation. )  . potassium chloride (K-DUR) 10 MEQ tablet Dissolve 1 tablet in water and administer via PEG tube daily (Patient taking differently: Take 10 mEq by mouth See admin instructions. Dissolve 10 meq in water and administer via PEG tube daily at night)  . sodium fluoride (PREVIDENT 5000 PLUS) 1.1 % CREA dental cream Apply gel to tooth brush. Brush teeth for 2 minutes. Spit out excess-DO NOT swallow. Repeat nightly. (Patient taking differently: Place 1 application onto teeth at bedtime. Apply gel to tooth brush. Brush  teeth for 2 minutes. Spit out excess-DO NOT swallow. Repeat nightly.)  . torsemide (DEMADEX) 20 MG tablet Take 20-40 mg by mouth as directed. 20mg  daily by by mouth except Tuesday and Saturday take 40 mg.  . warfarin (COUMADIN) 5 MG tablet Take 1-1.5 tablets (5-7.5 mg total) by mouth See admin instructions. TAKE AS DIRECTED BY ANTICOAGULATION CLINIC. Take 5 mg daily except Wednesday and Saturday take 7.5 mg normally  **For the next 3 days take 7.5 mg Daily and go back to regular schedule 3/18. Follow up with Coumadin Clinic Monday for INR Check (Patient taking differently: Take 5-7.5 mg by mouth See admin instructions. Take 5mg  once daily at night except on Wednesdays take 7.5 mg at night)  . [DISCONTINUED] HYDROcodone-acetaminophen (NORCO/VICODIN) 5-325 MG tablet Take 1-2 tablets by mouth every 6 (six) hours as needed for moderate pain.  . [DISCONTINUED] Menthol, Topical Analgesic, (BENGAY EX) Apply 1 application topically daily as needed (pain).  . [DISCONTINUED] Nutritional Supplements (PROMOD) LIQD Give 30ml (1 oz) 2 times per day via tube. Flush with 60ml of water before and after given.  . [DISCONTINUED] torsemide  (DEMADEX) 20 MG tablet Take 1 tablet (20 mg total) by mouth daily. (Patient taking differently: Take 20-40 mg by mouth See admin instructions. Take 20 mg by mouth once daily except on Tuesday and Saturday take 40 mg once daily)     Allergies:   Niaspan [niacin er]   Social History   Socioeconomic History  . Marital status: Married    Spouse name: [name]  . Number of children: 3  . Years of education: Not on file  . Highest education level: Not on file  Occupational History  . Occupation: Retired Engineer, materials  Social Needs  . Financial resource strain: Not on file  . Food insecurity:    Worry: Not on file    Inability: Not on file  . Transportation needs:    Medical: Not on file    Non-medical: Not on file  Tobacco Use  . Smoking status: Never Smoker  . Smokeless tobacco: Never Used  Substance and Sexual Activity  . Alcohol use: Yes    Comment: "a little wine every once in a while" 01/17/2018  . Drug use: No  . Sexual activity: Not on file  Lifestyle  . Physical activity:    Days per week: Not on file    Minutes per session: Not on file  . Stress: Not on file  Relationships  . Social connections:    Talks on phone: Not on file    Gets together: Not on file    Attends religious service: Not on file    Active member of club or organization: Not on file    Attends meetings of clubs or organizations: Not on file    Relationship status: Not on file  Other Topics Concern  . Not on file  Social History Narrative   Pt lives in split level home with his wife, Aggie Cosier.   Has 3 sons   9th grade education   Retired Electrical engineer     Family Hx: The patient's family history includes Heart disease in his mother; Hypertension in his father; Lupus in his mother; Stroke in his father; Sudden death in his mother.  ROS:    EKGs/Labs/Other Test Reviewed:    EKG:  EKG is not ordered today.    Recent Labs: 11/18/2017: ALT 23; Magnesium 2.0 12/02/2017: NT-Pro BNP  606 01/17/2018: BUN 19; Creatinine, Ser 0.87; Hemoglobin 13.1; Platelets  222; Potassium 3.2; Sodium 139 01/31/2018: TSH 0.79   Recent Lipid Panel Lab Results  Component Value Date/Time   CHOL 175 11/10/2017 12:01 PM   TRIG 105 11/10/2017 12:01 PM   HDL 65 11/10/2017 12:01 PM   CHOLHDL 2.7 11/10/2017 12:01 PM   CHOLHDL 5 11/13/2015 03:25 PM   LDLCALC 89 11/10/2017 12:01 PM   LDLDIRECT 72.0 11/13/2015 03:25 PM    Physical Exam:    VS:  BP (!) 126/58   Pulse 68   Ht 6' (1.829 m)   Wt 93 kg (205 lb)   BMI 27.80 kg/m      Pt is in NAD    Neck:  No JVD    Lungs Clear   cardiac Irreg Irreg  No signif murmurs   No S3    Abdomen Supple  No hepatommegaly Hernia at umbilicus Ext  Tr  edema L greater than R  Ankle deformed    Wt Readings from Last 3 Encounters:  03/03/18 93 kg (205 lb)  01/31/18 92.5 kg (204 lb)  01/18/18 98.5 kg (217 lb 2.5 oz)       ASSESSMENT:    No diagnosis found. PLAN:     1.Chronic diastolic CHF   LVEF has  normalized n most recent echo.  Volume status is OK   Triv edema    Will check BMET today on current dose of Torsemide   I am reluctant to switch    2  CAD  No symptoms of angina  Contnue to follow     3. Persistent atrial fibrillation (HCC)  Rate control and coumadin    Will stop Dig   Low dose   Expensive    4  S/p ICD  Follow with G [facility]  5. Malignant neoplasm of base of tongue (HCC)  Followed in oncology  And ENT C Newman    6  Question dementia Has f/u with Dr Collin [name]    Dispo:  F/U in  November     Medication Adjustments/Labs and Tests Ordered: Current medicines are reviewed at length with the patient today.  Concerns regarding medicines are outlined above.  Tests Ordered: No orders of the defined types were placed in this encounter.  Medication Changes: No orders of the defined types were placed in this encounter.   Signed, Dietrich Pates, MD  03/03/2018 9:34 AM    [facility] [address] [place],  [place], [place]  [address] Phone: [phone]; Fax: [phone]

## 2018-03-03 NOTE — Patient Instructions (Signed)
Medication Instructions:  Your physician has recommended you make the following change in your medication: Stop Digoxin    Labwork: Your physician recommends that you return for lab work today: BMET.   Testing/Procedures: None ordered   Follow-Up: Your physician recommends that you schedule a follow-up appointment in:  November 2019 with [name].     Any Other Special Instructions Will Be Listed Below (If Applicable).     If you need a refill on your cardiac medications before your next appointment, please call your pharmacy.

## 2018-03-03 NOTE — Progress Notes (Signed)
Oncology Nurse Navigator Documentation  Met with Mr. Runnion and his wife for post-RT follow-up.  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 He reported:  Has not been using PEG for several months, eating/drinking 100% by mouth.  Dr. Isidore Moos indicated she would place order for removal.  Seeing ENT [facility] regularly, next appt 06/07/18.  To RTC for follow-up with Dr. Isidore Moos in 8 months. They understand to call me with needs/concerns.  Gayleen Orem, RN, BSN Head & Neck Oncology Nurse [name] at [place] [facility] [phone]

## 2018-03-06 ENCOUNTER — Encounter: Payer: Self-pay | Admitting: Radiation Oncology

## 2018-03-06 ENCOUNTER — Other Ambulatory Visit: Payer: Self-pay | Admitting: Radiation Oncology

## 2018-03-06 ENCOUNTER — Encounter: Payer: Self-pay | Admitting: Family Medicine

## 2018-03-06 DIAGNOSIS — H40013 Open angle with borderline findings, low risk, bilateral: Secondary | ICD-10-CM | POA: Diagnosis not present

## 2018-03-06 DIAGNOSIS — Z961 Presence of intraocular lens: Secondary | ICD-10-CM | POA: Diagnosis not present

## 2018-03-06 DIAGNOSIS — H26491 Other secondary cataract, right eye: Secondary | ICD-10-CM | POA: Diagnosis not present

## 2018-03-06 DIAGNOSIS — E119 Type 2 diabetes mellitus without complications: Secondary | ICD-10-CM | POA: Diagnosis not present

## 2018-03-06 DIAGNOSIS — C01 Malignant neoplasm of base of tongue: Secondary | ICD-10-CM

## 2018-03-06 LAB — HM DIABETES EYE EXAM

## 2018-03-07 ENCOUNTER — Telehealth: Payer: Self-pay | Admitting: *Deleted

## 2018-03-07 NOTE — Telephone Encounter (Signed)
Called patient to inform of appt. to have peg tube removed on 03-24-18 - arrival time - 2 pm @ [facility], lvm for a return call

## 2018-03-08 ENCOUNTER — Encounter: Payer: Self-pay | Admitting: Family Medicine

## 2018-03-10 ENCOUNTER — Telehealth: Payer: Self-pay | Admitting: *Deleted

## 2018-03-10 NOTE — Telephone Encounter (Addendum)
Oncology Nurse Navigator Documentation  Pt wife called indicating they wish to have suction machine returned to [place] as he has not used for several months, she was told by [facility] order needed.  Called [facility], spoke with Latoya, she indicated order not needed, they will call patient to arrange pick-up.  I called pt wife to inform.    Provided wife contact information for [facility] dementia counseling program Epimenio Sarin, [phone]).  She voiced appreciation.    Gayleen Orem, RN, BSN Head & Neck Oncology Nurse [place] at [place] [facility] [phone]

## 2018-03-13 ENCOUNTER — Ambulatory Visit
Admission: RE | Admit: 2018-03-13 | Discharge: 2018-03-13 | Disposition: A | Discharge status: Home / Self Care | Payer: Medicare HMO | Source: Ambulatory Visit | Attending: Neurology | Admitting: Neurology

## 2018-03-13 DIAGNOSIS — Z85810 Personal history of malignant neoplasm of tongue: Secondary | ICD-10-CM

## 2018-03-13 DIAGNOSIS — G3184 Mild cognitive impairment, so stated: Secondary | ICD-10-CM | POA: Diagnosis not present

## 2018-03-13 MED ORDER — IOPAMIDOL (ISOVUE-300) INJECTION 61%
75.0000 mL | Freq: Once | INTRAVENOUS | Status: AC | PRN
  Administered 2018-03-13: 75 mL via INTRAVENOUS

## 2018-03-15 ENCOUNTER — Other Ambulatory Visit: Payer: Self-pay | Admitting: *Deleted

## 2018-03-15 ENCOUNTER — Telehealth: Payer: Self-pay | Admitting: *Deleted

## 2018-03-15 DIAGNOSIS — C01 Malignant neoplasm of base of tongue: Secondary | ICD-10-CM

## 2018-03-16 ENCOUNTER — Telehealth: Payer: Self-pay | Admitting: *Deleted

## 2018-03-16 NOTE — Telephone Encounter (Signed)
Oncology Nurse Navigator Documentation  Spoke with pt wife to inform order to DC younker suction faxed to [facility] yesterday afternoon, she indicated [facility] arrived earlier this afternoon to pick up device.  Gayleen Orem, RN, BSN Head & Neck Oncology Nurse [place] at [place] [phone]

## 2018-03-16 NOTE — Telephone Encounter (Signed)
Oncology Nurse Navigator Documentation  In follow-up to call from pt wife indicating she rec'd call from [facility] indicating order to DC younker suction IS NEEDED, I spoke with Daryll [facility] to confirm, faxed order entered by this navigator/signed by Dr. Sondra Come in Dr. Pearlie Oyster absence.  Notification of successful fax transmission received.  Gayleen Orem, RN, BSN Head & Neck Oncology Nurse [place] at [place] `

## 2018-03-17 IMAGING — CT CT NECK W/ CM
4 of 6 series · 14 of 33 positions shown, 16 images · IV contrast (75CC ISOVUE 300)
Comparison: None.

CLINICAL DATA: 75 y/o  M; right neck mass.

EXAM:
CT NECK WITH CONTRAST
TECHNIQUE: Multidetector CT imaging of the neck was performed using the
standard protocol following the bolus administration of intravenous
contrast.
CONTRAST:  75mL [id number] IOPAMIDOL ([id number]) INJECTION 61%

[Series 3: axial neck · axial · 0.49mm/px · z∈[-37,+83]mm · 3 of 98 slices shown]
[im 25/98  bone]
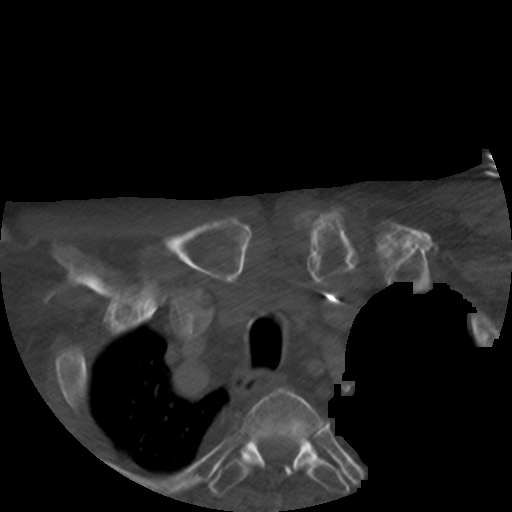
[im 49/98  bone]
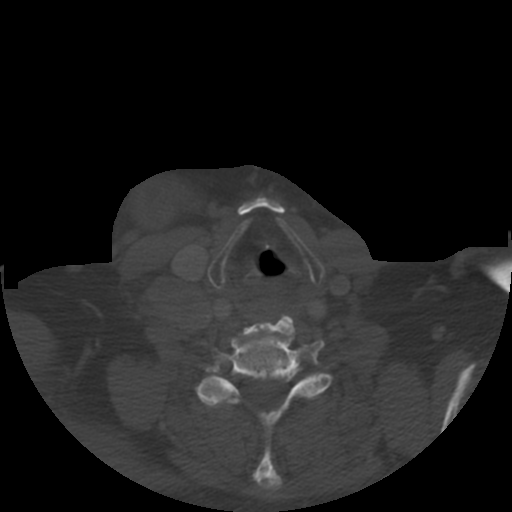
[im 73/98  bone]
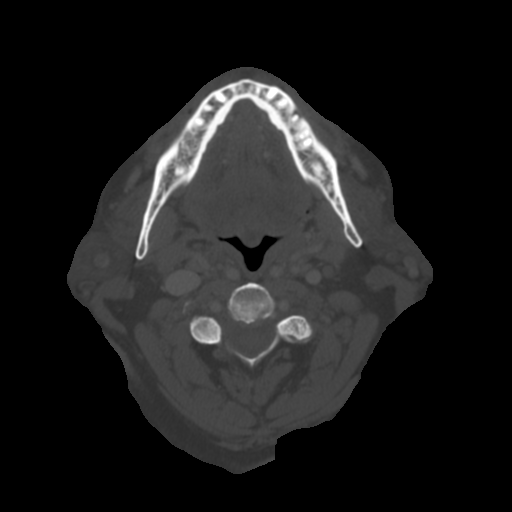

[Series 200: sag · sagittal · 0.49mm/px · 5 of 117 slices shown, 6 images]
[im 39/117  bone]
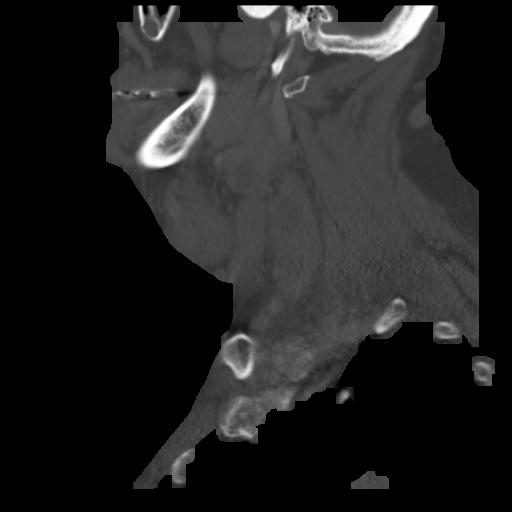
[im 49/117  bone]
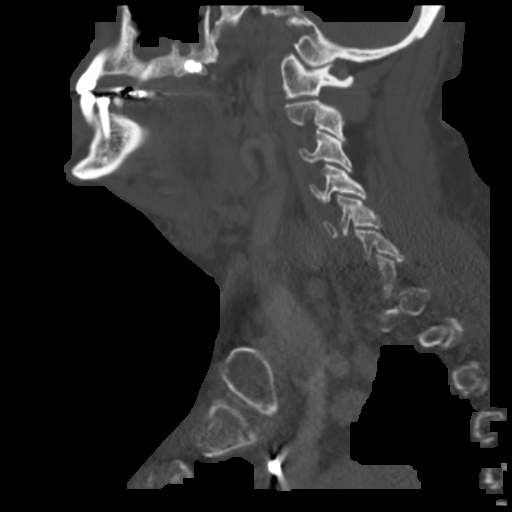
[im 59/117  soft-tissue]
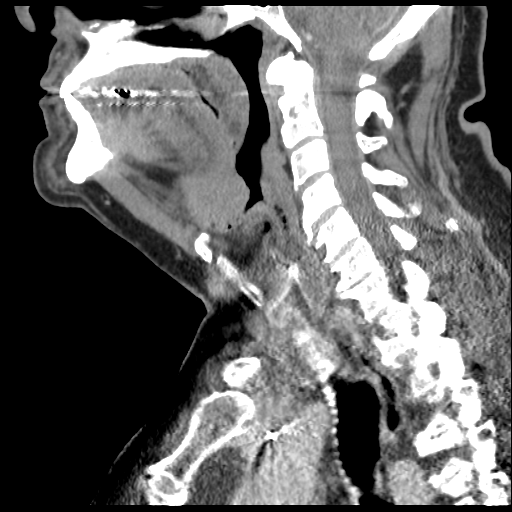
[im 59/117  bone]
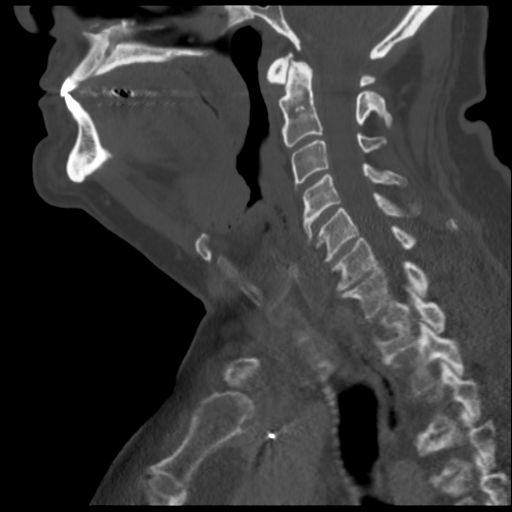
[im 68/117  bone]
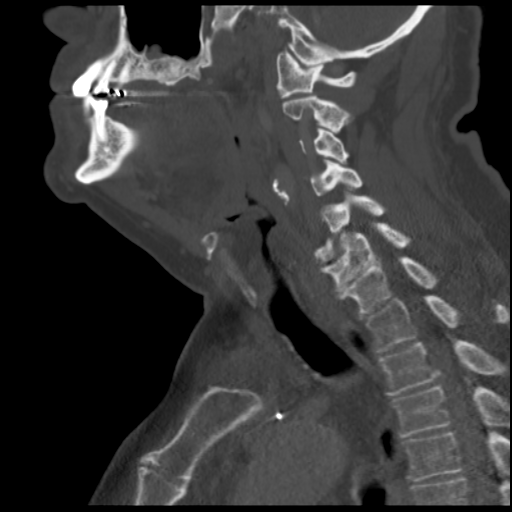
[im 78/117  bone]
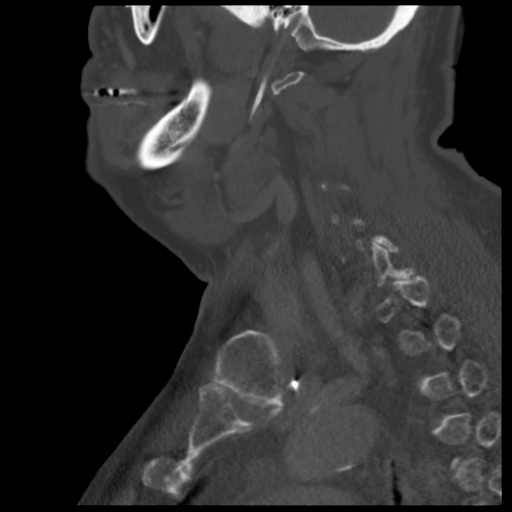

[Series 201: cor · coronal · 0.49mm/px · 3 of 117 slices shown]
[im 26/117  bone]
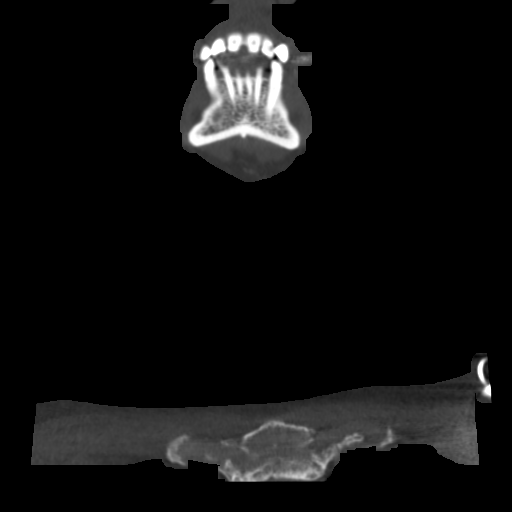
[im 48/117  bone]
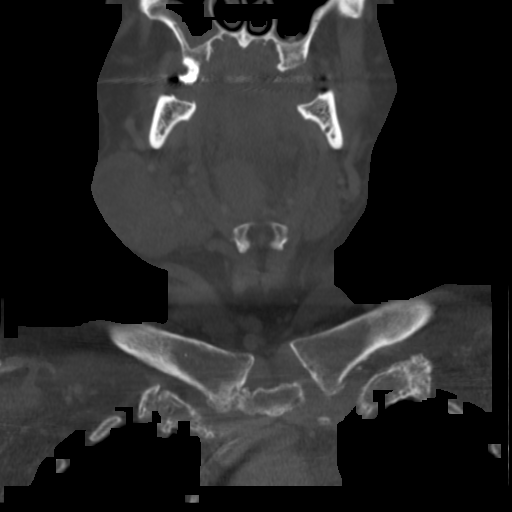
[im 70/117  bone]
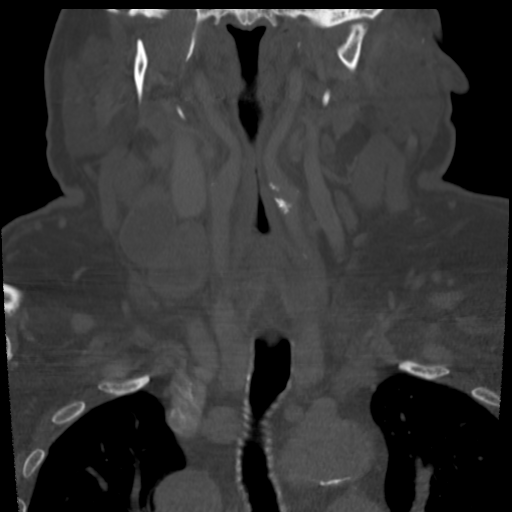

[Series 202: angled axial · axial · 0.49mm/px · z∈[-75,+33]mm · 3 of 117 slices shown, 4 images]
[im 30/117  soft-tissue]
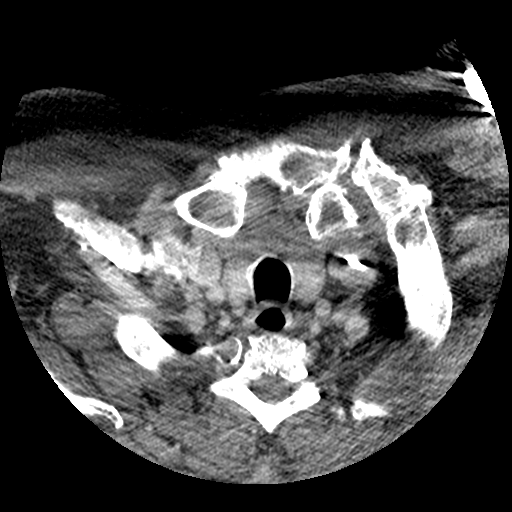
[im 30/117  bone]
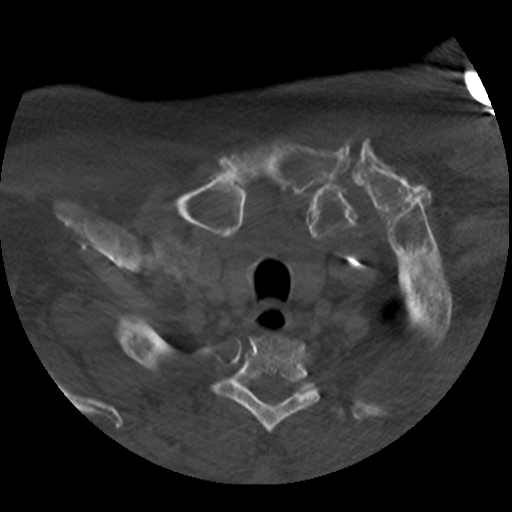
[im 59/117  bone]
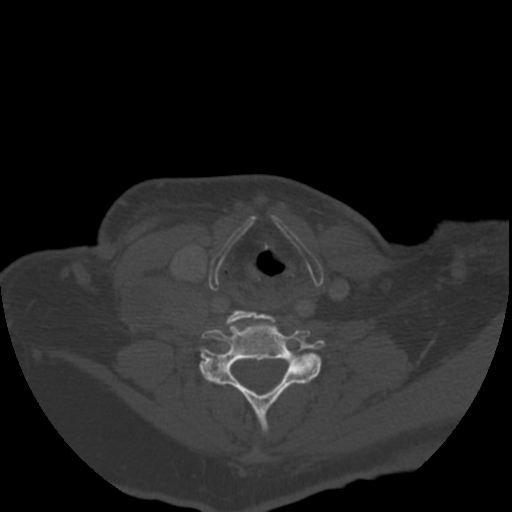
[im 88/117  bone]
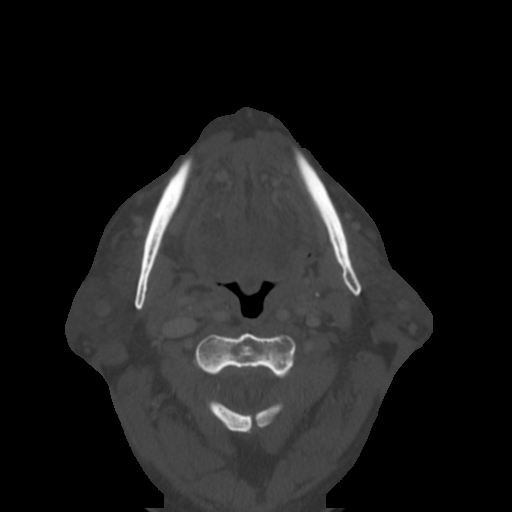

[14 of 33 positions shown; findings below may reference images not displayed]

FINDINGS: Pharynx and larynx: Right base of tongue mass measuring 36 x 32 x 37
mm (AP x ML x CC series 3, image 37 and series 200, image 56). The
mass effaces the right vallecula glossopharyngeal sulcus and
inferiorly abuts the hyoid bone. The mass invades the tongue 27 mm
and extends approximately 9 mm into the contralateral base of
tongue.

Salivary glands: No inflammation, mass, or stone.

Thyroid: Subcentimeter low hypodense foci probably represent
nodules.

Lymph nodes: Necrotic lymphadenopathy in the right level 2, 3, 4,
and 5a/b stations. Left-sided cervical necrotic lymphadenopathy at
the level 2 station. The largest lymph node in the right level 2
station measures 41 x 39 x 49 mm (AP x ML x CC). There is stranding
surrounding the right upper cervical lymph nodes with irregular
margins for example (series 200, image 39) and an indistinct border
between the right submandibular gland and adjacent adenopathy
(series 3, image 47) indicating probable extra nodal extension.

Vascular: Patent carotid and vertebral arteries of the neck. Patent
internal jugular veins. Moderate calcific atherosclerosis of carotid
bifurcations without high-grade stenosis. Brief submucosal
retropharyngeal course of proximal ICA bilaterally.

Limited intracranial: Negative.

Visualized orbits: Not within the field of view.

Mastoids and visualized paranasal sinuses: Right greater than left
maxillary sinus mucosal thickening.

Skeleton: No acute fracture or suspicious osseous lesion. Moderate
cervical spondylosis with predominantly discogenic degenerative
changes. No high-grade bony canal stenosis.

Upper chest: Aortic atherosclerosis with moderate calcification.

Other: None.
IMPRESSION: 1. Right base of tongue mass likely representing carcinoma. The mass
measures up to 37 mm. Visible mass invades base of tongue 27 mm and
extends 9 mm to contralateral base of tongue.
2. Right level 2, 3, 4, 5a/b necrotic lymphadenopathy with findings
of extra nodal extension. Left level 2 lymphadenopathy.
These results will be called to the ordering clinician or
representative by the Radiologist Assistant, and communication
documented in the PACS or zVision Dashboard.

By: Migel Drown M.D.

## 2018-03-18 IMAGING — DX DG CHEST 1V PORT
1 series · 1 of 1 positions shown · non-contrast
Comparison: 11/10/2016

CLINICAL DATA: NG tube placement.

EXAM:
PORTABLE CHEST 1 VIEW

[chest ap]
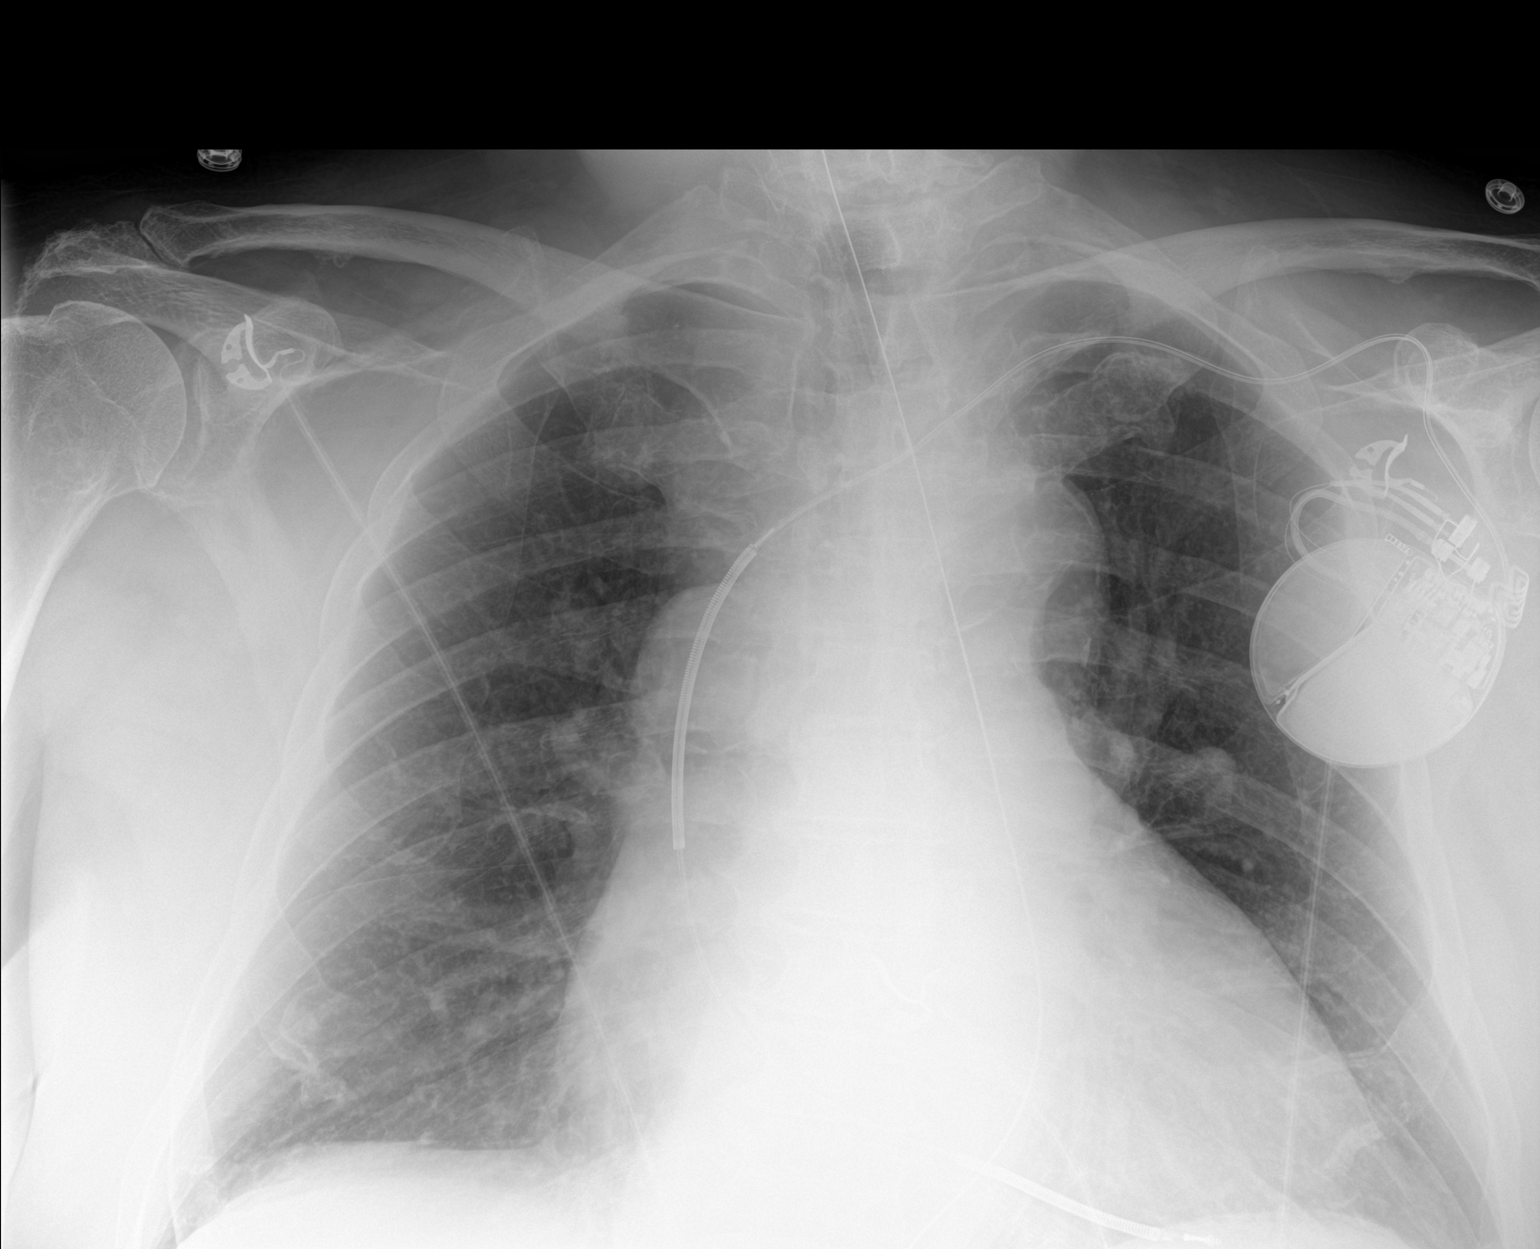

[1 of 1 positions shown; findings below may reference images not displayed]

FINDINGS: An enteric tube has been placed and demonstrates a single loop in
the neck before coursing through the chest with tip not visualized
on this study (see separate abdominal radiograph for tip and side
hole position). A single lead ICD remains in place. The cardiac
silhouette remains enlarged. The costophrenic angles were
incompletely imaged. No sizable pleural effusion is identified. No
confluent airspace opacity, overt pulmonary edema, or pneumothorax
is seen. No acute osseous abnormality is identified.
IMPRESSION: 1. Enteric tube with single loop in the neck. Tip not imaged on this
study, see separate abdominal radiograph report.
2. No evidence of active cardiopulmonary disease.

## 2018-03-18 IMAGING — DX DG ABD PORTABLE 1V
1 series · 1 of 1 positions shown · non-contrast
Comparison: Prior radiograph from 11/10/2016.

CLINICAL DATA: Initial evaluation for NG tube placement.

EXAM:
PORTABLE ABDOMEN - 1 VIEW

[abdomen kub]
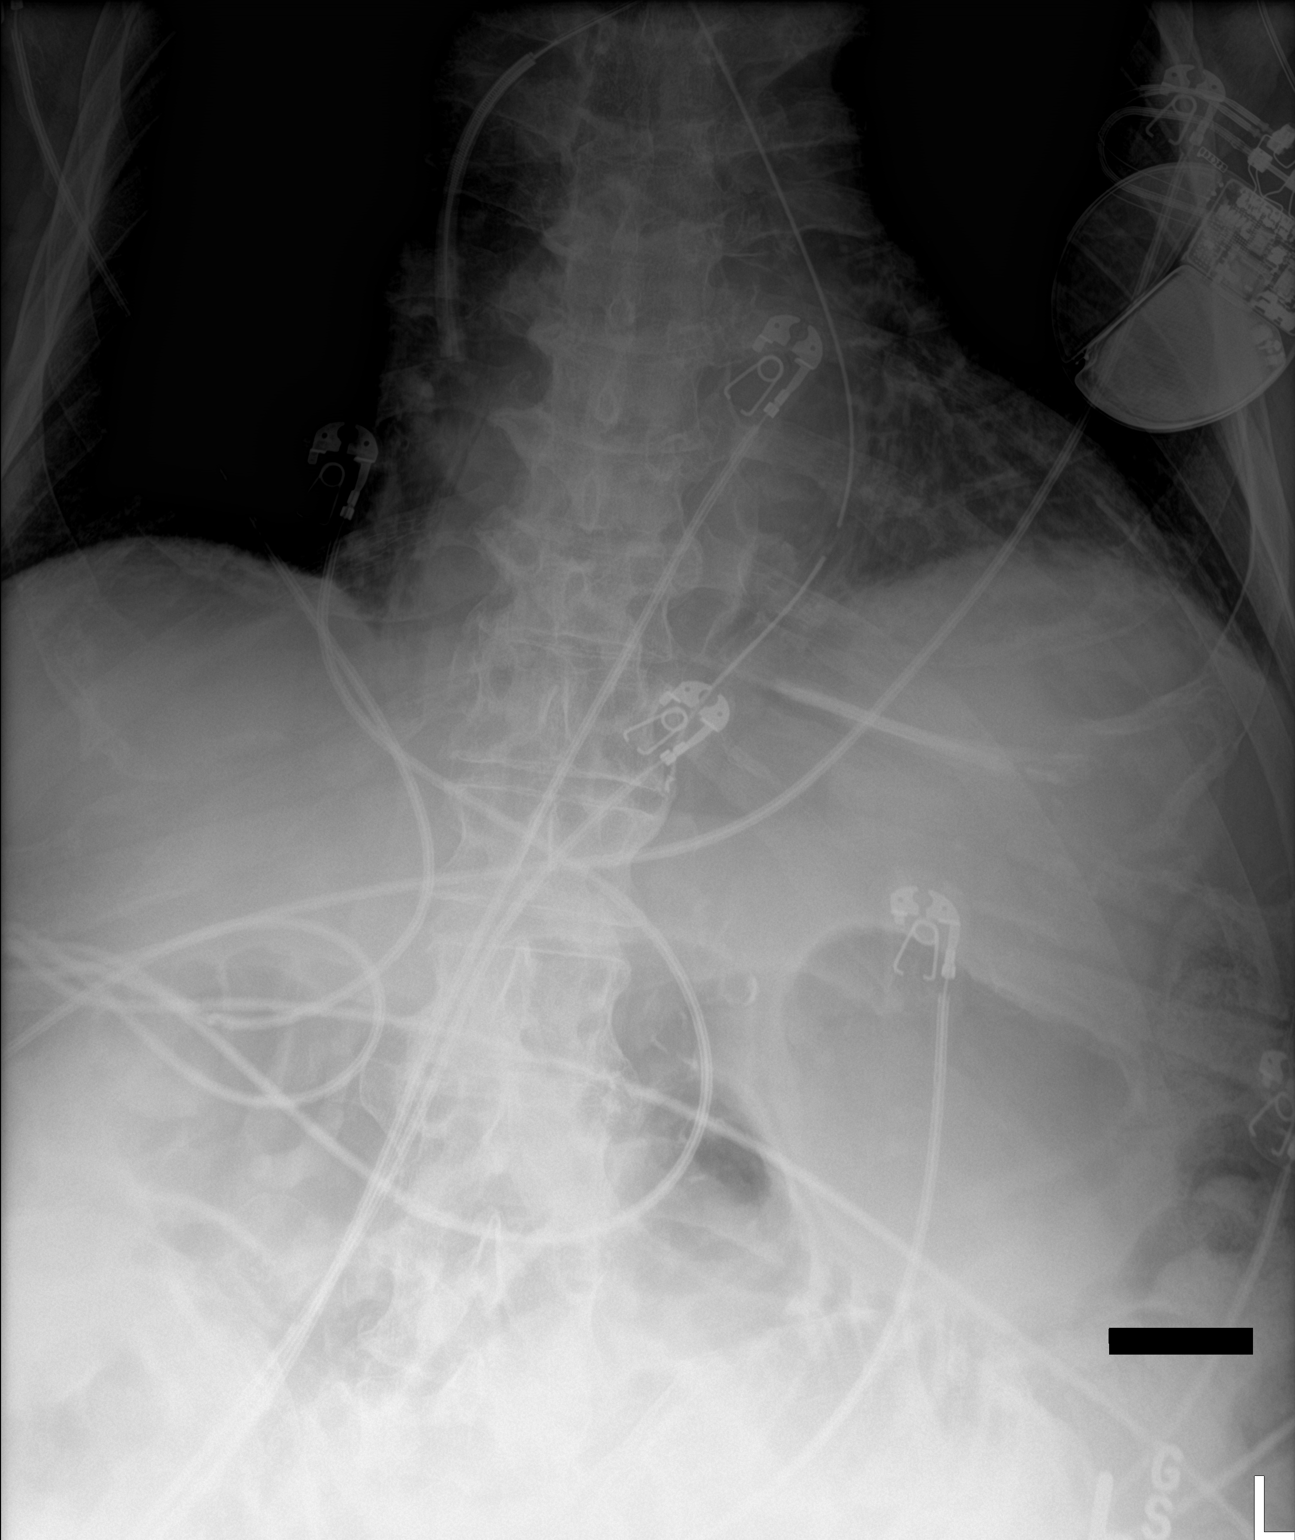

[1 of 1 positions shown; findings below may reference images not displayed]

FINDINGS: NG tube in place, with tip at or just above the GE junction, side
hole in the distal esophagus. Advancement by approximately 7-8 cm is
recommended.

Again, several mildly prominent gas-filled loops of bowel noted
within the upper abdomen, better evaluated on prior study.

Mild bibasilar subsegmental atelectasis.
IMPRESSION: Tip of enteric tube at the level the GE junction, side hole in the
distal esophagus. Advancement by 7-8 cm suggested to insure adequate
placement side hole within the stomach.

## 2018-03-20 ENCOUNTER — Ambulatory Visit (INDEPENDENT_AMBULATORY_CARE_PROVIDER_SITE_OTHER): Payer: Medicare HMO | Admitting: *Deleted

## 2018-03-20 DIAGNOSIS — I428 Other cardiomyopathies: Secondary | ICD-10-CM | POA: Diagnosis not present

## 2018-03-20 IMAGING — CR DG ABD PORTABLE 1V
2 series · 2 of 2 positions shown · non-contrast
Comparison: None.

CLINICAL DATA: Small bowel obstruction.

EXAM:
PORTABLE ABDOMEN - 1 VIEW

[AP (1 of 2)]
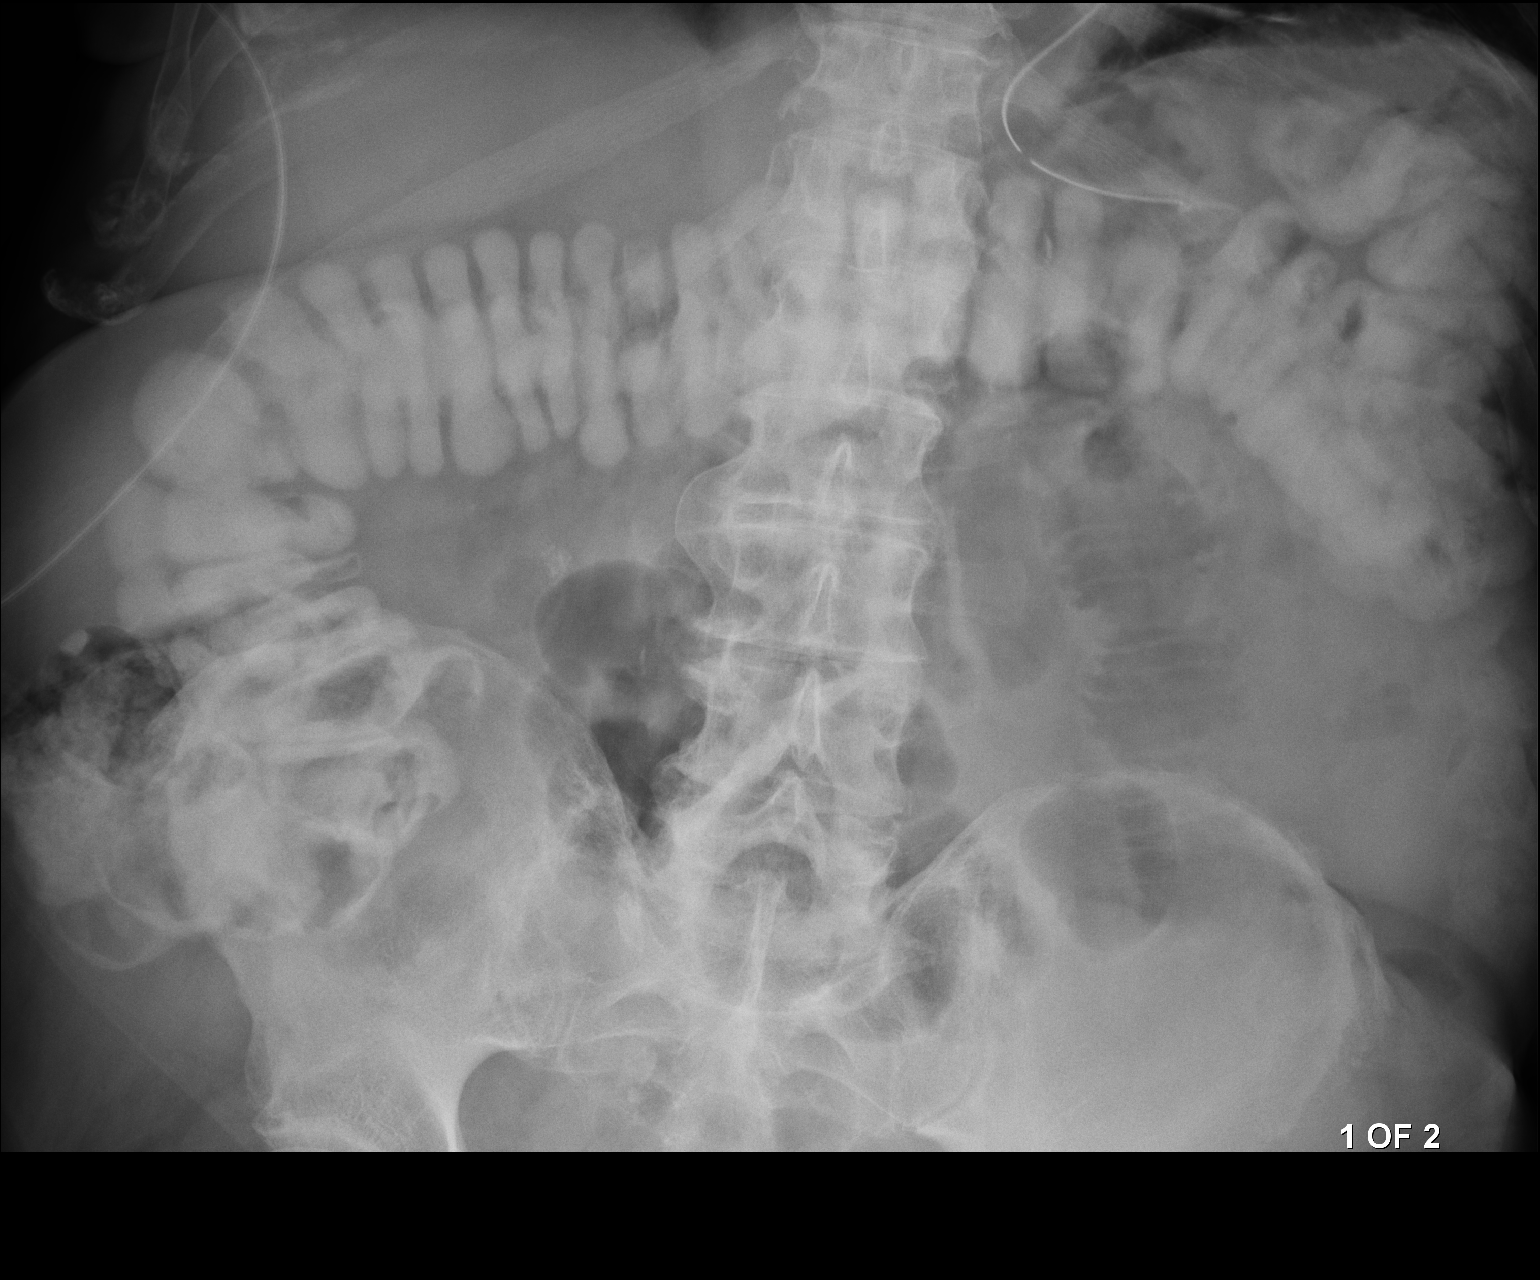

[AP (2 of 2)]
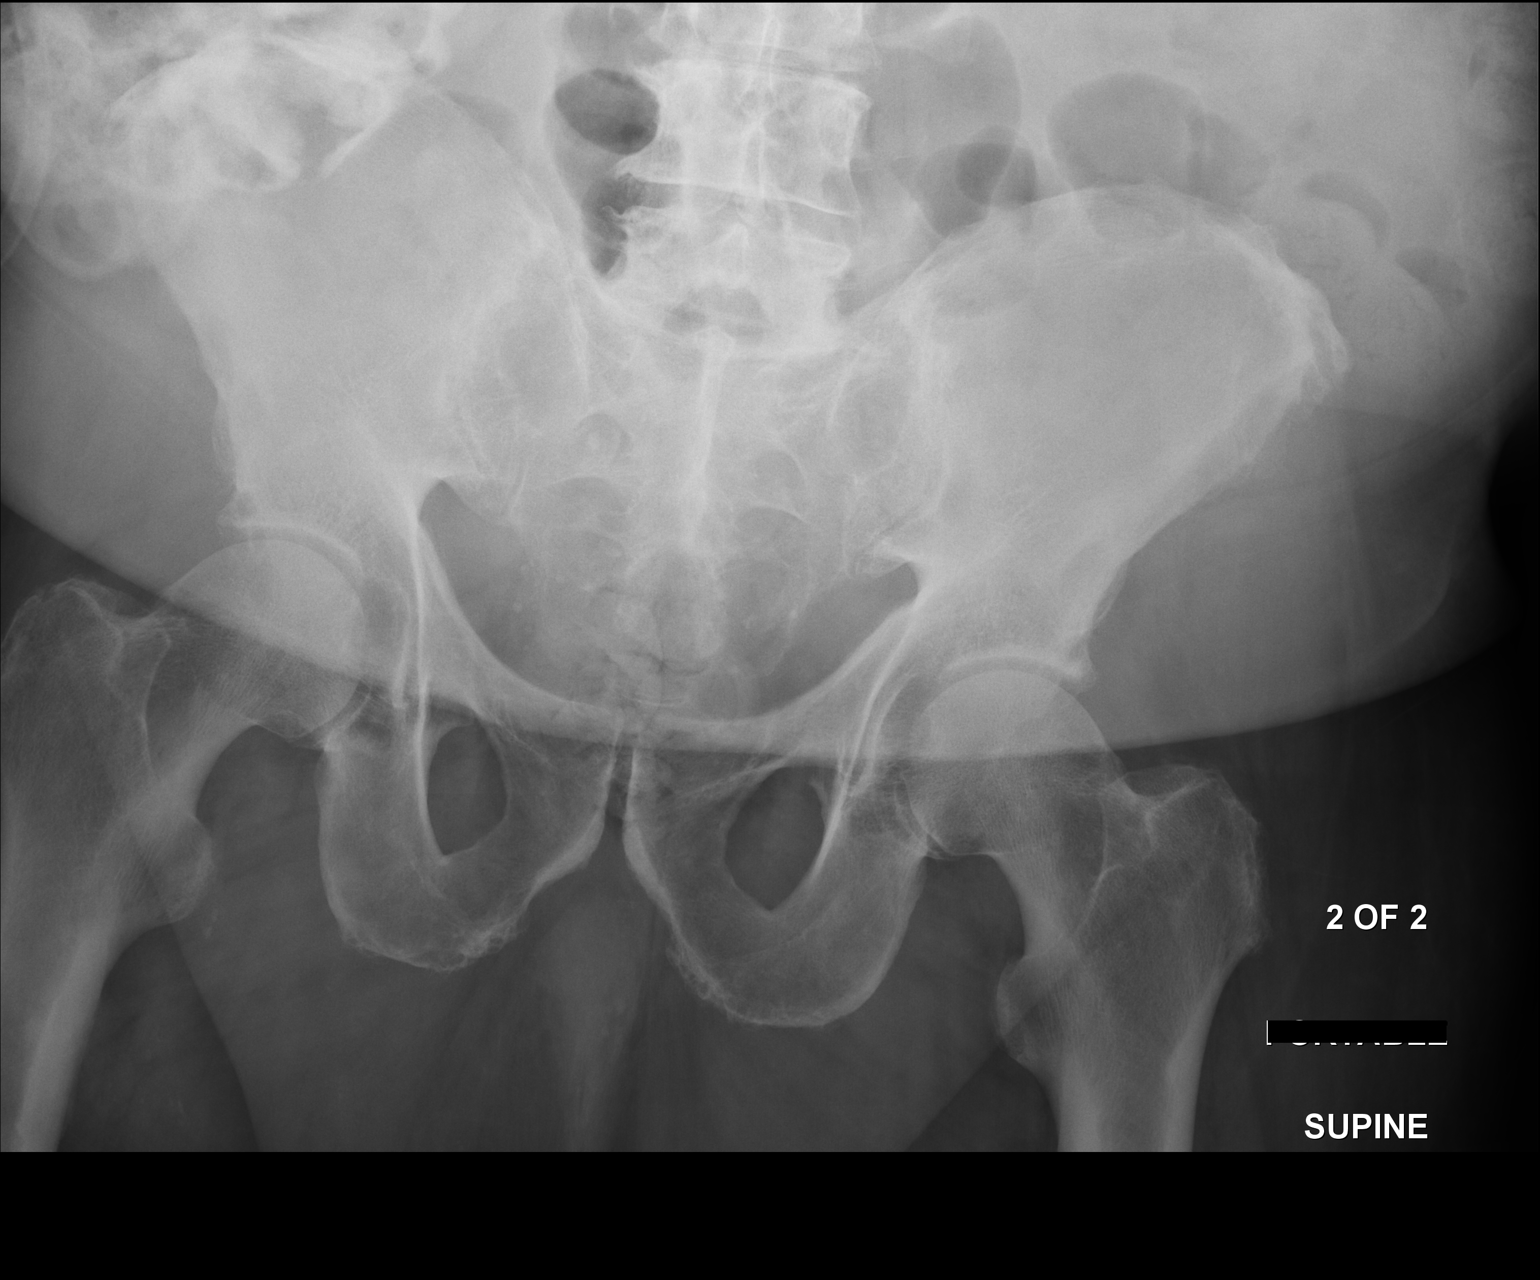

[2 of 2 positions shown; findings below may reference images not displayed]

FINDINGS: There is oral contrast material in the colon. There is a nasogastric
tube with the tip projecting over the stomach with the proximal port
just beyond the gastroesophageal junction. There are mildly dilated
loops of small bowel in the left side of the abdomen measuring up to
4 cm. There is no evidence of pneumoperitoneum, portal venous gas or
pneumatosis. There are no pathologic calcifications along the
expected course of the ureters. The osseous structures are
unremarkable.
IMPRESSION: 1. Persistent mildly dilated loops of small bowel in the left side
of the abdomen measuring up to 4 cm with oral contrast material
within the colon. This may reflect a partial small bowel
obstruction. No significant interval change compared with
11/11/2016.
2. nasogastric tube with the tip projecting over the stomach with
the proximal port just beyond the gastroesophageal junction.
Recommend advancing the nasogastric tube 10 cm.

## 2018-03-20 NOTE — Progress Notes (Signed)
Remote ICD transmission.   

## 2018-03-21 LAB — CUP PACEART REMOTE DEVICE CHECK
Battery Remaining Longevity: 144 mo
Battery Remaining Percentage: 100 %
Brady Statistic RV Percent Paced: 24 %
Date Time Interrogation Session: 20190715081100
HighPow Impedance: 40 Ohm
Implantable Lead Implant Date: 20060209
Implantable Lead Location: 753860
Implantable Lead Model: 158
Implantable Lead Serial Number: 156422
Implantable Pulse Generator Implant Date: 20150904
Lead Channel Impedance Value: 373 Ohm
Lead Channel Pacing Threshold Amplitude: 1 V
Lead Channel Pacing Threshold Pulse Width: 0.4 ms
Lead Channel Setting Pacing Amplitude: 2.4 V
Lead Channel Setting Pacing Pulse Width: 0.4 ms
Lead Channel Setting Sensing Sensitivity: 0.5 mV
Pulse Gen Serial Number: 191056

## 2018-03-22 ENCOUNTER — Encounter: Payer: Self-pay | Admitting: Cardiology

## 2018-03-24 ENCOUNTER — Encounter (HOSPITAL_COMMUNITY): Payer: Self-pay | Admitting: Student

## 2018-03-24 ENCOUNTER — Ambulatory Visit (HOSPITAL_COMMUNITY)
Admission: RE | Admit: 2018-03-24 | Discharge: 2018-03-24 | Disposition: A | Discharge status: Home / Self Care | Payer: Medicare HMO | Source: Ambulatory Visit | Attending: Radiation Oncology | Admitting: Radiation Oncology

## 2018-03-24 DIAGNOSIS — Z431 Encounter for attention to gastrostomy: Secondary | ICD-10-CM | POA: Diagnosis not present

## 2018-03-24 DIAGNOSIS — C01 Malignant neoplasm of base of tongue: Secondary | ICD-10-CM

## 2018-03-24 DIAGNOSIS — Z85810 Personal history of malignant neoplasm of tongue: Secondary | ICD-10-CM | POA: Diagnosis not present

## 2018-03-24 HISTORY — PX: IR GASTROSTOMY TUBE REMOVAL: IMG5492

## 2018-03-24 MED ORDER — LIDOCAINE VISCOUS HCL 2 % MT SOLN
OROMUCOSAL | Status: AC
  Filled 2018-03-24: qty 15

## 2018-03-29 ENCOUNTER — Other Ambulatory Visit: Payer: Self-pay | Admitting: Family Medicine

## 2018-03-29 ENCOUNTER — Ambulatory Visit (INDEPENDENT_AMBULATORY_CARE_PROVIDER_SITE_OTHER): Payer: Medicare HMO | Admitting: General Practice

## 2018-03-29 ENCOUNTER — Other Ambulatory Visit: Payer: Self-pay | Admitting: Internal Medicine

## 2018-03-29 DIAGNOSIS — I480 Paroxysmal atrial fibrillation: Secondary | ICD-10-CM

## 2018-03-29 DIAGNOSIS — Z7901 Long term (current) use of anticoagulants: Secondary | ICD-10-CM | POA: Diagnosis not present

## 2018-03-29 LAB — POCT INR: INR: 3.6 — AB (ref 2.0–3.0)

## 2018-03-29 NOTE — Patient Instructions (Addendum)
Pre visit review using our clinic review tool, if applicable. No additional management support is needed unless otherwise documented below in the visit note.  Hold coumadin today (7/24) and then take 1 tablet daily!  Re-check in 4 weeks.

## 2018-03-29 NOTE — Telephone Encounter (Signed)
Last OV 11/22/2017   Last refilled 09/14/2017 disp 180 with 1 refill   Sent to PCP to advise

## 2018-04-26 ENCOUNTER — Ambulatory Visit (INDEPENDENT_AMBULATORY_CARE_PROVIDER_SITE_OTHER): Payer: Medicare HMO | Admitting: General Practice

## 2018-04-26 DIAGNOSIS — Z7901 Long term (current) use of anticoagulants: Secondary | ICD-10-CM

## 2018-04-26 DIAGNOSIS — I480 Paroxysmal atrial fibrillation: Secondary | ICD-10-CM

## 2018-04-26 LAB — POCT INR: INR: 2.7 (ref 2.0–3.0)

## 2018-04-26 NOTE — Patient Instructions (Signed)
Pre visit review using our clinic review tool, if applicable. No additional management support is needed unless otherwise documented below in the visit note.  Continue to take 1 tablet daily.  Re-check in 4 weeks.  

## 2018-05-02 ENCOUNTER — Other Ambulatory Visit (HOSPITAL_COMMUNITY): Payer: Self-pay | Admitting: Dentistry

## 2018-05-03 NOTE — Telephone Encounter (Signed)
A prescription for PreviDent 5000+ was renewed 1 time. Patient was instructed to have his primary dentist provide additional refills.. Dr. Enrique Sack

## 2018-05-24 ENCOUNTER — Ambulatory Visit (INDEPENDENT_AMBULATORY_CARE_PROVIDER_SITE_OTHER): Payer: Medicare HMO | Admitting: General Practice

## 2018-05-24 DIAGNOSIS — I480 Paroxysmal atrial fibrillation: Secondary | ICD-10-CM

## 2018-05-24 DIAGNOSIS — Z7901 Long term (current) use of anticoagulants: Secondary | ICD-10-CM | POA: Diagnosis not present

## 2018-05-24 LAB — POCT INR: INR: 1.7 — AB (ref 2.0–3.0)

## 2018-05-24 NOTE — Patient Instructions (Addendum)
Pre visit review using our clinic review tool, if applicable. No additional management support is needed unless otherwise documented below in the visit note.  Continue to take 1 tablet daily.  Re-check in 4 weeks.  

## 2018-06-15 DIAGNOSIS — Z85810 Personal history of malignant neoplasm of tongue: Secondary | ICD-10-CM | POA: Diagnosis not present

## 2018-06-19 ENCOUNTER — Ambulatory Visit (INDEPENDENT_AMBULATORY_CARE_PROVIDER_SITE_OTHER): Payer: Medicare HMO | Admitting: *Deleted

## 2018-06-19 DIAGNOSIS — I428 Other cardiomyopathies: Secondary | ICD-10-CM

## 2018-06-20 NOTE — Progress Notes (Signed)
Remote ICD transmission.   

## 2018-06-21 ENCOUNTER — Ambulatory Visit (INDEPENDENT_AMBULATORY_CARE_PROVIDER_SITE_OTHER): Payer: Medicare HMO | Admitting: General Practice

## 2018-06-21 DIAGNOSIS — I480 Paroxysmal atrial fibrillation: Secondary | ICD-10-CM | POA: Diagnosis not present

## 2018-06-21 DIAGNOSIS — Z7901 Long term (current) use of anticoagulants: Secondary | ICD-10-CM

## 2018-06-21 LAB — POCT INR: INR: 2.5 (ref 2.0–3.0)

## 2018-06-21 NOTE — Patient Instructions (Addendum)
Pre visit review using our clinic review tool, if applicable. No additional management support is needed unless otherwise documented below in the visit note.  Continue to take 1 tablet daily.  Re-check in 4 weeks.  

## 2018-06-21 NOTE — Progress Notes (Signed)
I have reviewed and agree with this plan. I was available for consultation if needed 

## 2018-06-22 ENCOUNTER — Encounter: Payer: Self-pay | Admitting: Cardiology

## 2018-06-30 DIAGNOSIS — R69 Illness, unspecified: Secondary | ICD-10-CM | POA: Diagnosis not present

## 2018-07-12 LAB — CUP PACEART REMOTE DEVICE CHECK
Date Time Interrogation Session: 20191106125535
Implantable Lead Implant Date: 20060209
Implantable Lead Location: 753860
Implantable Lead Model: 158
Implantable Lead Serial Number: 156422
Implantable Pulse Generator Implant Date: 20150904
Pulse Gen Serial Number: 191056

## 2018-07-17 ENCOUNTER — Ambulatory Visit: Payer: Self-pay | Admitting: Internal Medicine

## 2018-07-17 ENCOUNTER — Encounter (INDEPENDENT_AMBULATORY_CARE_PROVIDER_SITE_OTHER): Payer: Medicare HMO | Admitting: Ophthalmology

## 2018-07-17 DIAGNOSIS — H353112 Nonexudative age-related macular degeneration, right eye, intermediate dry stage: Secondary | ICD-10-CM

## 2018-07-17 DIAGNOSIS — H43813 Vitreous degeneration, bilateral: Secondary | ICD-10-CM

## 2018-07-17 DIAGNOSIS — I10 Essential (primary) hypertension: Secondary | ICD-10-CM | POA: Diagnosis not present

## 2018-07-17 DIAGNOSIS — H26491 Other secondary cataract, right eye: Secondary | ICD-10-CM | POA: Diagnosis not present

## 2018-07-17 DIAGNOSIS — E113293 Type 2 diabetes mellitus with mild nonproliferative diabetic retinopathy without macular edema, bilateral: Secondary | ICD-10-CM | POA: Diagnosis not present

## 2018-07-17 DIAGNOSIS — H35033 Hypertensive retinopathy, bilateral: Secondary | ICD-10-CM | POA: Diagnosis not present

## 2018-07-17 DIAGNOSIS — E11319 Type 2 diabetes mellitus with unspecified diabetic retinopathy without macular edema: Secondary | ICD-10-CM | POA: Diagnosis not present

## 2018-07-17 DIAGNOSIS — H35373 Puckering of macula, bilateral: Secondary | ICD-10-CM | POA: Diagnosis not present

## 2018-07-19 ENCOUNTER — Ambulatory Visit (INDEPENDENT_AMBULATORY_CARE_PROVIDER_SITE_OTHER): Payer: Medicare HMO | Admitting: General Practice

## 2018-07-19 DIAGNOSIS — Z7901 Long term (current) use of anticoagulants: Secondary | ICD-10-CM

## 2018-07-19 DIAGNOSIS — I480 Paroxysmal atrial fibrillation: Secondary | ICD-10-CM

## 2018-07-19 LAB — POCT INR: INR: 3.1 — AB (ref 2.0–3.0)

## 2018-07-19 NOTE — Patient Instructions (Signed)
Pre visit review using our clinic review tool, if applicable. No additional management support is needed unless otherwise documented below in the visit note.  Hold coumadin today and then continue to take 1 tablet daily!  Re-check in 4 weeks.

## 2018-07-20 ENCOUNTER — Other Ambulatory Visit: Payer: Self-pay | Admitting: Family Medicine

## 2018-07-20 ENCOUNTER — Ambulatory Visit: Payer: Self-pay | Admitting: Physician Assistant

## 2018-08-01 ENCOUNTER — Encounter (INDEPENDENT_AMBULATORY_CARE_PROVIDER_SITE_OTHER): Payer: Medicare HMO | Admitting: Ophthalmology

## 2018-08-01 DIAGNOSIS — H2701 Aphakia, right eye: Secondary | ICD-10-CM

## 2018-08-02 ENCOUNTER — Encounter: Payer: Self-pay | Admitting: Family Medicine

## 2018-08-02 ENCOUNTER — Ambulatory Visit (INDEPENDENT_AMBULATORY_CARE_PROVIDER_SITE_OTHER): Payer: Medicare HMO | Admitting: Family Medicine

## 2018-08-02 VITALS — BP 100/68 | HR 77 | Temp 98.3°F | Ht 72.0 in | Wt 206.2 lb

## 2018-08-02 DIAGNOSIS — R05 Cough: Secondary | ICD-10-CM

## 2018-08-02 DIAGNOSIS — R059 Cough, unspecified: Secondary | ICD-10-CM

## 2018-08-02 MED ORDER — DOXYCYCLINE HYCLATE 100 MG PO CAPS: 100.0000 mg | ORAL_CAPSULE | Freq: Two times a day (BID) | ORAL | 0 refills | Status: DC

## 2018-08-02 MED ORDER — PREDNISONE 10 MG PO TABS: ORAL_TABLET | ORAL | 0 refills | Status: DC

## 2018-08-02 NOTE — Progress Notes (Signed)
Subjective:     Patient ID: Collin [name], male   DOB: 1940-09-26, 77 y.o.   MRN: [id number]  HPI Patient seen with fatigue and cough productive of white sputum for 4 to 5 days.  No fever.  Has had some rhinorrhea.  Some yellow-tinged mucus.  Chronic problems include history of atrial fibrillation, nonischemic cardiomyopathy, chronic diastolic heart failure, history of tongue cancer, past history of diabetes which reversed with weight loss  Past Medical History:  Diagnosis Date  . AICD (automatic cardioverter/defibrillator) present   . Atrial fibrillation (HCC)   . Cancer (HCC)    tongue cancer  . Cardiomyopathy, nonischemic (HCC) 1998  . CHF (congestive heart failure) (HCC)   . Claustrophobia   . Diabetes mellitus   . Dysrhythmia   . Heart disease   . History of echocardiogram    Echo 8/18:  EF 55-60, no RWMA, severe LAE, normal RVSF, mild RAE  . History of radiation therapy 12/07/16- 01/24/17   Base of Tongue 70 Gy in 35 fractions  . Hyperlipidemia   . Memory loss   . OSA (obstructive sleep apnea)   . Shingles   . Umbilical hernia   . 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 It sometimes shows as a shadow on scans.    Past Surgical History:  Procedure Laterality Date  . CARDIAC DEFIBRILLATOR PLACEMENT  2006  . CATARACT EXTRACTION Bilateral    01/07/2016. 01/22/2016  . CYST EXCISION     multiple drainage to a cyst in his neck.   Marland Kitchen DIRECT LARYNGOSCOPY N/A 11/16/2016   Procedure: DIRECT LARYNGOSCOPY AND BIOPSY;  Surgeon: [name], MD;  Location: [facility] OR;  Service: ENT;  Laterality: N/A;  . EYE SURGERY    . HERNIA REPAIR  1950s  . IMPLANTABLE CARDIOVERTER DEFIBRILLATOR (ICD) GENERATOR CHANGE N/A 05/10/2014   Procedure: ICD GENERATOR CHANGE;  Surgeon: [name], MD;  Location: [facility] CATH LAB;  Service: Cardiovascular;  Laterality: N/A;  . INSERTION OF MESH N/A 01/18/2018   Procedure: INSERTION OF MESH;  Surgeon:  [name], MD;  Location: [facility] OR;  Service: General;  Laterality: N/A;  . IR GASTROSTOMY TUBE MOD SED  12/27/2016  . IR GASTROSTOMY TUBE REMOVAL  03/24/2018  . IR PATIENT EVAL TECH 0-60 MINS  01/17/2017  . MASS EXCISION Right 11/16/2016   Procedure: EXCISION RIGHT NECK LIPOMA;  Surgeon: [name], MD;  Location: [facility] OR;  Service: ENT;  Laterality: Right;  . MOHS SURGERY  01/01/2015   to top of head  . MOUTH SURGERY     teeth removed 4 teeth extracted 02/2010  . RADICAL NECK DISSECTION Right 07/25/2017   Procedure: RIGHT NECK DISSECTION;  Surgeon: [name], MD;  Location: [facility] OR;  Service: ENT;  Laterality: Right;  . UMBILICAL HERNIA REPAIR  01/18/2018   w/mesh  . UMBILICAL HERNIA REPAIR N/A 01/18/2018   Procedure: UMBILICAL HERNIA REPAIR WITH MESH;  Surgeon: [name], MD;  Location: [facility] OR;  Service: General;  Laterality: N/A;    reports that he has never smoked. He has never used smokeless tobacco. He reports that he drinks alcohol. He reports that he does not use drugs. family history includes Heart disease in his mother; Hypertension in his father; Lupus in his mother; Stroke in his father; Sudden death in his mother. Allergies  Allergen Reactions  . Niaspan [Niacin Er] Other (See Comments)  FLUSHED     Review of Systems  Constitutional: Positive for fatigue.  HENT: Positive for congestion.   Respiratory: Positive for cough. Negative for shortness of breath.        Objective:   Physical Exam  Constitutional: He appears well-developed and well-nourished.  HENT:  Mouth/Throat: Oropharynx is clear and moist.  Cardiovascular: Normal rate and regular rhythm.  Pulmonary/Chest:  has a few expiratory wheezes.  No retractions.  Pulse oximetry 98%.       Assessment:     Cough.  Mild reactive airway component.  No respiratory distress.  High risk of complication.    Plan:     -Stay well-hydrated -Prednisone taper starting at 40 mg  daily -Doxycycline 100 mg twice daily for 10 days -Follow-up immediately for any fever or worsening symptoms such as increased shortness of breath.  [name] MD [name]

## 2018-08-02 NOTE — Patient Instructions (Signed)
Stay well hydrated  Follow up for any fever or increased shortness of breath.

## 2018-08-08 ENCOUNTER — Encounter: Payer: Self-pay | Admitting: Physician Assistant

## 2018-08-08 ENCOUNTER — Ambulatory Visit
Admission: RE | Admit: 2018-08-08 | Discharge: 2018-08-08 | Disposition: A | Discharge status: Home / Self Care | Payer: Medicare HMO | Source: Ambulatory Visit | Attending: Medical | Admitting: Medical

## 2018-08-08 ENCOUNTER — Ambulatory Visit: Payer: Medicare HMO | Admitting: Medical

## 2018-08-08 VITALS — BP 110/88 | HR 108 | Ht 72.0 in | Wt 207.4 lb

## 2018-08-08 DIAGNOSIS — R05 Cough: Secondary | ICD-10-CM | POA: Diagnosis not present

## 2018-08-08 DIAGNOSIS — E785 Hyperlipidemia, unspecified: Secondary | ICD-10-CM | POA: Diagnosis not present

## 2018-08-08 DIAGNOSIS — R059 Cough, unspecified: Secondary | ICD-10-CM

## 2018-08-08 DIAGNOSIS — I428 Other cardiomyopathies: Secondary | ICD-10-CM | POA: Diagnosis not present

## 2018-08-08 DIAGNOSIS — I4819 Other persistent atrial fibrillation: Secondary | ICD-10-CM | POA: Diagnosis not present

## 2018-08-08 DIAGNOSIS — Z95810 Presence of automatic (implantable) cardiac defibrillator: Secondary | ICD-10-CM | POA: Diagnosis not present

## 2018-08-08 NOTE — Progress Notes (Signed)
Cardiology Office Note   Date:  08/08/2018   ID:  Collin [name], DOB May 14, 1941, MRN [id number]  PCP:  [name], MD  Cardiologist:  Dietrich Pates, MD EP: None  Chief Complaint  Patient presents with  . Follow-up    CHF and atrial fibrillation      History of Present Illness: Collin [name] is a 77 y.o. male with a PMH of NICM s/p ICD placement for secondary prevention, chronic combined CHF (EF improved to 55-60% on last echo 2018), persistent atrial fibrillation on coumadin, HLD, DM type 2, OSA, and tongue cancer s/p surgery and radiation, who presents for routine follow-up of his CHF and atrial fibrillation.  He was last seen by Dr. Tenny Craw outpatient 02/2018 at which time he was doing well from a cardiac standpoint. His torsemide had been adjusted on previous visit 06/2017 and he reported improvement in his breathing, LE edema, and orthopnea. No medication changes occurred at this visit. He was seen by his PCP 08/02/18 for cough and prescribed a prednisone taper, doxycycline 100mg  BID x10 days, and cough suppressants.    He presents today for routine follow-up of his CHF and atrial fibrillation. His primary complaint at this time if a persistent, worsening cough. He reports feeling body aches and chills over the last several days but has not taken his temperature at home. He received his flu shot this season. Prior to his current illness he was doing well from a cardiac standpoint. He reports increased energy since starting lexapro. He is able to mow his lawn without DOE or chest pain. He denies orthopnea, PND, or LE edema. He has not noticed his heart racing or palpitations. Denies problems with bleeding - no melena, hematochezia, or hematuria.      Past Medical History:  Diagnosis Date  . AICD (automatic cardioverter/defibrillator) present   . Atrial fibrillation (HCC)   . Cancer (HCC)    tongue cancer  . Cardiomyopathy, nonischemic (HCC) 1998  . CHF (congestive  heart failure) (HCC)   . Claustrophobia   . Diabetes mellitus   . Dysrhythmia   . Heart disease   . History of echocardiogram    Echo 8/18:  EF 55-60, no RWMA, severe LAE, normal RVSF, mild RAE  . History of radiation therapy 12/07/16- 01/24/17   Base of Tongue 70 Gy in 35 fractions  . Hyperlipidemia   . Memory loss   . OSA (obstructive sleep apnea)   . Shingles   . Umbilical hernia   . 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 It sometimes shows as a shadow on scans.     Past Surgical History:  Procedure Laterality Date  . CARDIAC DEFIBRILLATOR PLACEMENT  2006  . CATARACT EXTRACTION Bilateral    01/07/2016. 01/22/2016  . CYST EXCISION     multiple drainage to a cyst in his neck.   Marland Kitchen DIRECT LARYNGOSCOPY N/A 11/16/2016   Procedure: DIRECT LARYNGOSCOPY AND BIOPSY;  Surgeon: [name], MD;  Location: [facility] OR;  Service: ENT;  Laterality: N/A;  . EYE SURGERY    . HERNIA REPAIR  1950s  . IMPLANTABLE CARDIOVERTER DEFIBRILLATOR (ICD) GENERATOR CHANGE N/A 05/10/2014   Procedure: ICD GENERATOR CHANGE;  Surgeon: [name], MD;  Location: [facility] CATH LAB;  Service: Cardiovascular;  Laterality: N/A;  . INSERTION OF MESH N/A 01/18/2018   Procedure: INSERTION OF MESH;  Surgeon: [name], MD;  Location: [facility] OR;  Service: General;  Laterality: N/A;  . IR GASTROSTOMY TUBE MOD SED  12/27/2016  . IR GASTROSTOMY TUBE REMOVAL  03/24/2018  . IR PATIENT EVAL TECH 0-60 MINS  01/17/2017  . MASS EXCISION Right 11/16/2016   Procedure: EXCISION RIGHT NECK LIPOMA;  Surgeon: [name], MD;  Location: [facility] OR;  Service: ENT;  Laterality: Right;  . MOHS SURGERY  01/01/2015   to top of head  . MOUTH SURGERY     teeth removed 4 teeth extracted 02/2010  . RADICAL NECK DISSECTION Right 07/25/2017   Procedure: RIGHT NECK DISSECTION;  Surgeon: [name], MD;  Location: [facility] OR;  Service: ENT;  Laterality: Right;  . UMBILICAL HERNIA REPAIR   01/18/2018   w/mesh  . UMBILICAL HERNIA REPAIR N/A 01/18/2018   Procedure: UMBILICAL HERNIA REPAIR WITH MESH;  Surgeon: [name], MD;  Location: [facility] OR;  Service: General;  Laterality: N/A;     Current Outpatient Medications  Medication Sig Dispense Refill  . acetaminophen (TYLENOL) 500 MG tablet Take 1,000 mg by mouth daily as needed for moderate pain.     . carvedilol (COREG) 25 MG tablet TAKE ONE TABLET BY MOUTH TWICE A DAY 60 tablet 0  . diltiazem (CARDIZEM) 90 MG tablet Take 1 tablet (90 mg total) by mouth daily. 90 tablet 3  . doxycycline (VIBRAMYCIN) 100 MG capsule Take 1 capsule (100 mg total) by mouth 2 (two) times daily. 20 capsule 0  . escitalopram (LEXAPRO) 10 MG tablet Take 1 tablet (10 mg total) by mouth daily. 30 tablet 11  . guaifenesin (ROBITUSSIN) 100 MG/5ML syrup Take 200 mg by mouth at bedtime as needed for cough.    . polyethylene glycol (MIRALAX) packet Take 17 g by mouth daily as needed. Available OTC 30 each 0  . potassium chloride (K-DUR,KLOR-CON) 10 MEQ tablet DISSOLVE  1 TABLET IN WATER AND ADMINISTER VIA PEG TUDE DAILY 90 tablet 3  . predniSONE (DELTASONE) 10 MG tablet Taper as follows: 4-4-4-3-3-2-2 22 tablet 0  . rosuvastatin (CRESTOR) 10 MG tablet Take 1 tablet (10 mg total) by mouth daily. 90 tablet 3  . sodium fluoride (DENTA 5000 PLUS) 1.1 % CREA dental cream Apply gel to tooth brush. Brush teeth for 2 minutes. Spit out excess-DO NOT swallow. Repeat nightly. 1 Tube 0  . torsemide (DEMADEX) 20 MG tablet Take 20-40 mg by mouth as directed. 20mg  daily by by mouth except Tuesday and Saturday take 40 mg.    . warfarin (COUMADIN) 5 MG tablet Take 1-1.5 tablets (5-7.5 mg total) by mouth See admin instructions. TAKE AS DIRECTED BY ANTICOAGULATION CLINIC. Take 5 mg daily except Wednesday and Saturday take 7.5 mg normally  **For the next 3 days take 7.5 mg Daily and go back to regular schedule 3/18. Follow up with Coumadin Clinic Monday for INR Check (Patient taking  differently: Take 5-7.5 mg by mouth See admin instructions. Take 5mg  once daily at night except on Wednesdays take 7.5 mg at night)     No current facility-administered medications for this visit.     Allergies:   Niaspan [niacin er]    Social History:  The patient  reports that he has never smoked. He has never used smokeless tobacco. He reports that he drinks alcohol. He reports that he does not use drugs.   Family History:  The patient's family history includes Heart disease in his mother; Hypertension in his father; Lupus in his mother; Stroke in his father; Sudden  death in his mother.    ROS:  Please see the history of present illness.   Otherwise, review of systems are positive for none.   All other systems are reviewed and negative.    PHYSICAL EXAM: VS:  BP 110/88   Pulse (!) 108   Ht 6' (1.829 m)   Wt 207 lb 6.4 oz (94.1 kg)   SpO2 97%   BMI 28.13 kg/m  , BMI Body mass index is 28.13 kg/m. GEN: Well nourished, well developed, sitting on exam table in NAD with frequent coughing HEENT: sclera anicteric Neck: no JVD, carotid bruits, or masses Cardiac: IRIR; no murmurs, rubs, or gallops, no edema  Respiratory:  Scattered wheezing and rhonchi; frequent coughing, no increased work of breathing GI: soft, nontender, nondistended, + BS MS: no deformity or atrophy Skin: warm and dry, no rash Neuro:  Strength and sensation are intact Psych: euthymic mood, full affect   EKG:  EKG is ordered today. The ekg ordered today demonstrates atrial fibrillation with rate 108, no STE/D, no TWI.    Recent Labs: 11/18/2017: ALT 23; Magnesium 2.0 12/02/2017: NT-Pro BNP 606 01/17/2018: Hemoglobin 13.1; Platelets 222 01/31/2018: TSH 0.79 03/03/2018: BUN 18; Creatinine, Ser 0.69; Potassium 4.5; Sodium 142    Lipid Panel    Component Value Date/Time   CHOL 175 11/10/2017 1201   TRIG 105 11/10/2017 1201   HDL 65 11/10/2017 1201   CHOLHDL 2.7 11/10/2017 1201   CHOLHDL 5 11/13/2015 1525    VLDL 73.0 (H) 10/04/2014 0931   LDLCALC 89 11/10/2017 1201   LDLDIRECT 72.0 11/13/2015 1525      Wt Readings from Last 3 Encounters:  08/08/18 207 lb 6.4 oz (94.1 kg)  08/02/18 206 lb 3.2 oz (93.5 kg)  03/03/18 204 lb 3.2 oz (92.6 kg)      Other studies Reviewed: Additional studies/ records that were reviewed today include:   Echocardiogram 04/2017: Study Conclusions  - Left ventricle: Systolic function was normal. The estimated ejection fraction was in the range of 55% to 60%. Wall motion was normal; there were no regional wall motion abnormalities. - Left atrium: Severely dilated. - Right ventricle: The cavity size was normal. Wall thickness was normal. AICD wire noted in right ventricle. Systolic function was normal. - Right atrium: The atrium was mildly dilated. AICD wire noted in right atrium. - Atrial septum: No defect or patent foramen ovale was identified. - Inferior vena cava: The vessel was normal in size. The   respirophasic diameter changes were in the normal range (>= 50%), consistent with normal central venous pressure.  Impressions:  - Compared to a prior study in 2015, there are no significant changes.     ASSESSMENT AND PLAN:  1. Chronic combined CHF: appears euvolemic on exam. No volume overload complaints in recent months. Last echo 04/2017 with EF 55-60%. Encouraged maintaining a low sodium diet and monitoring weights.  - Continue carvedilol and torsemide  2. Persistent atrial fibrillation: HR elevated to 108 today. Likely due to ongoing URI. Follows with the coumadin clinic for INR checks.  - Continue current carvedilol and diltiazem dosing for rate control - expect HR will return to normal with improvement in illness - Continue coumadin for stroke ppx  3. Cough: patient reports worsening productive cough, chills, and body aches despite prednisone taper (course to be completed today), and doxycycline 100mg  BID. He has scattered wheezing/rhonchi on  exam.  - Will check a CXR and CBC today - Appointment arranged to see a provider  in Dr. Lucie Leather office for close follow-up given worsening symptoms.   4. HLD: LDL 89 11/2017 - Continue crestor 10mg  daily  5. S/p AICD: placed for secondary prevention in the setting of NICM. Follows with Dr. [name]. Last device check with episodes of NSVT (longest 24 seconds).  - Continue routine device monitoring per Dr. [name].     Current medicines are reviewed at length with the patient today.  The patient does not have concerns regarding medicines.  The following changes have been made:  no change  Labs/ tests ordered today include:   Orders Placed This Encounter  Procedures  . DG Chest 2 View  . CBC  . EKG 12-Lead     Disposition:   FU with Dr. Tenny Craw in 4-6 months  Signed, Beatriz Stallion, PA-C  08/08/2018 4:41 PM

## 2018-08-08 NOTE — Patient Instructions (Addendum)
Medication Instructions:  Your physician recommends that you continue on your current medications as directed. Please refer to the Current Medication list given to you today.  If you need a refill on your cardiac medications before your next appointment, please call your pharmacy.   Lab work: TODAY: CBC If you have labs (blood work) drawn today and your tests are completely normal, you will receive your results only by: Marland Kitchen MyChart Message (if you have MyChart) OR . A paper copy in the mail If you have any lab test that is abnormal or we need to change your treatment, we will call you to review the results.  Testing/Procedures: A chest x-ray takes a picture of the organs and structures inside the chest, including the heart, lungs, and blood vessels. This test can show several things, including, whether the heart is enlarges; whether fluid is building up in the lungs; and whether pacemaker / defibrillator leads are still in place.   Follow-Up: At [facility], you and your health needs are our priority.  As part of our continuing mission to provide you with exceptional heart care, we have created designated Provider Care Teams.  These Care Teams include your primary Cardiologist (physician) and Advanced Practice Providers (APPs -  Physician Assistants and Nurse Practitioners) who all work together to provide you with the care you need, when you need it. You will need a follow up appointment in:  4-6 weeks.  Please call our office 2 months in advance to schedule this appointment.  You may see Dorris Carnes, MD or one of the following Advanced Practice Providers on your designated Care Team: [name], PA-C [place], [place] . Daune Perch, NP  PLEASE FOLLOW UP WITH YOUR PRIMARY CARE 12/4 @ 11 AM WITH DR. Sharlene Motts  Any Other Special Instructions Will Be Listed Below (If Applicable).

## 2018-08-09 ENCOUNTER — Ambulatory Visit: Payer: Self-pay | Admitting: Family Medicine

## 2018-08-09 LAB — CBC
Hematocrit: 43.5 % (ref 37.5–51.0)
Hemoglobin: 14.9 g/dL (ref 13.0–17.7)
MCH: 31.1 pg (ref 26.6–33.0)
MCHC: 34.3 g/dL (ref 31.5–35.7)
MCV: 91 fL (ref 79–97)
Platelets: 249 10*3/uL (ref 150–450)
RBC: 4.79 x10E6/uL (ref 4.14–5.80)
RDW: 14.3 % (ref 12.3–15.4)
WBC: 7.4 10*3/uL (ref 3.4–10.8)

## 2018-08-10 ENCOUNTER — Other Ambulatory Visit: Payer: Self-pay | Admitting: Family Medicine

## 2018-08-10 ENCOUNTER — Encounter: Payer: Self-pay | Admitting: Family Medicine

## 2018-08-10 ENCOUNTER — Ambulatory Visit (INDEPENDENT_AMBULATORY_CARE_PROVIDER_SITE_OTHER): Payer: Medicare HMO | Admitting: Family Medicine

## 2018-08-10 VITALS — BP 110/78 | HR 89 | Temp 97.8°F | Wt 205.1 lb

## 2018-08-10 DIAGNOSIS — J209 Acute bronchitis, unspecified: Secondary | ICD-10-CM | POA: Diagnosis not present

## 2018-08-10 MED ORDER — IPRATROPIUM BROMIDE 0.02 % IN SOLN
0.5000 mg | Freq: Once | RESPIRATORY_TRACT | Status: AC
  Administered 2018-08-10: 0.5 mg via RESPIRATORY_TRACT

## 2018-08-10 MED ORDER — ALBUTEROL SULFATE HFA 108 (90 BASE) MCG/ACT IN AERS: 2.0000 | INHALATION_SPRAY | RESPIRATORY_TRACT | 0 refills | Status: DC | PRN

## 2018-08-10 MED ORDER — ALBUTEROL SULFATE (2.5 MG/3ML) 0.083% IN NEBU
2.5000 mg | INHALATION_SOLUTION | Freq: Once | RESPIRATORY_TRACT | Status: AC
  Administered 2018-08-10: 2.5 mg via RESPIRATORY_TRACT

## 2018-08-10 MED ORDER — AMOXICILLIN-POT CLAVULANATE 875-125 MG PO TABS: 1.0000 | ORAL_TABLET | Freq: Two times a day (BID) | ORAL | 0 refills | Status: DC

## 2018-08-10 NOTE — Progress Notes (Signed)
Subjective:    Patient ID: Collin [name], male    DOB: Nov 13, 1940, 77 y.o.   MRN: [id number]  HPI Here for 10 days of a deep cough that produces yellow sputum, wheezing, and SOB. No fever or chest pain. He was seen here on 08-02-18 and was given a prednisone taper and Doxycycline. He now feels a little better, but the chest congestion and cough persist. He was seen in Cardiology on 08-08-18 and he had a CXR done that was clear and he had a CBC drawn which showed a normal WBC count at 7.4.    Review of Systems  Constitutional: Negative.   HENT: Negative.   Eyes: Negative.   Respiratory: Positive for cough, chest tightness, shortness of breath and wheezing.   Cardiovascular: Negative.        Objective:   Physical Exam  Constitutional: He appears well-developed and well-nourished. No distress.  HENT:  Right Ear: External ear normal.  Left Ear: External ear normal.  Nose: Nose normal.  Mouth/Throat: Oropharynx is clear and moist.  Eyes: Conjunctivae are normal.  Neck: No thyromegaly present.  Pulmonary/Chest: Effort normal. No stridor. He has no rales.  Scattered rhonchi and wheezes   Lymphadenopathy:    He has no cervical adenopathy.          Assessment & Plan:  Bronchitis, he is given a nebulization treatment with Duoneb and this helped open him up a bit. We will stop the Doxycycline and start  On Augmentin. Add a Proair inhaler prn.  Gershon Crane, MD

## 2018-08-14 ENCOUNTER — Ambulatory Visit (INDEPENDENT_AMBULATORY_CARE_PROVIDER_SITE_OTHER): Payer: Medicare HMO | Admitting: General Practice

## 2018-08-14 DIAGNOSIS — I480 Paroxysmal atrial fibrillation: Secondary | ICD-10-CM

## 2018-08-14 DIAGNOSIS — Z7901 Long term (current) use of anticoagulants: Secondary | ICD-10-CM

## 2018-08-14 LAB — POCT INR: INR: 5.6 — AB (ref 2.0–3.0)

## 2018-08-14 NOTE — Patient Instructions (Addendum)
Pre visit review using our clinic review tool, if applicable. No additional management support is needed unless otherwise documented below in the visit note.  Hold coumadin today, tomorrow and Wednesday and take 1/2 tablet on Thursday.  On Fri, Sat and Sun take 1 tablet.  Re-check on Monday.

## 2018-08-18 DIAGNOSIS — H40013 Open angle with borderline findings, low risk, bilateral: Secondary | ICD-10-CM | POA: Diagnosis not present

## 2018-08-20 ENCOUNTER — Other Ambulatory Visit: Payer: Self-pay | Admitting: Family Medicine

## 2018-08-21 ENCOUNTER — Ambulatory Visit (INDEPENDENT_AMBULATORY_CARE_PROVIDER_SITE_OTHER): Payer: Medicare HMO | Admitting: General Practice

## 2018-08-21 ENCOUNTER — Other Ambulatory Visit: Payer: Self-pay | Admitting: Family Medicine

## 2018-08-21 DIAGNOSIS — I480 Paroxysmal atrial fibrillation: Secondary | ICD-10-CM

## 2018-08-21 DIAGNOSIS — Z7901 Long term (current) use of anticoagulants: Secondary | ICD-10-CM | POA: Diagnosis not present

## 2018-08-21 LAB — POCT INR: INR: 1.8 — AB (ref 2.0–3.0)

## 2018-08-21 NOTE — Patient Instructions (Addendum)
Pre visit review using our clinic review tool, if applicable. No additional management support is needed unless otherwise documented below in the visit note. Take 1 1/2 tablets today and then take 1 tablet daily.  Re-check in 4 weeks.

## 2018-09-11 ENCOUNTER — Ambulatory Visit: Payer: Self-pay | Admitting: Neurology

## 2018-09-12 ENCOUNTER — Ambulatory Visit: Payer: Self-pay | Admitting: Physician Assistant

## 2018-09-12 ENCOUNTER — Telehealth: Payer: Self-pay

## 2018-09-12 NOTE — Telephone Encounter (Signed)
Reviewed pt chart and saw that pt needs to f/u in 4-6 months instead of 4-6 weeks with [name].  Spoke with pt and pt wife (DPR on file) to make aware of findings in chart of appointment mix-up. Asked pt if he is having any symptoms and pt and pt wife denies any symptoms at this time. Rescheduled pt for May 22nd @ 1:20 pm with [name]. Pt was happy to rescheduled and verbalized understanding. Pt thanked me for the call.

## 2018-09-16 ENCOUNTER — Other Ambulatory Visit: Payer: Self-pay | Admitting: Family Medicine

## 2018-09-18 ENCOUNTER — Ambulatory Visit (INDEPENDENT_AMBULATORY_CARE_PROVIDER_SITE_OTHER): Payer: Medicare HMO | Admitting: General Practice

## 2018-09-18 ENCOUNTER — Ambulatory Visit (INDEPENDENT_AMBULATORY_CARE_PROVIDER_SITE_OTHER): Payer: Medicare HMO

## 2018-09-18 DIAGNOSIS — I428 Other cardiomyopathies: Secondary | ICD-10-CM | POA: Diagnosis not present

## 2018-09-18 DIAGNOSIS — Z7901 Long term (current) use of anticoagulants: Secondary | ICD-10-CM | POA: Diagnosis not present

## 2018-09-18 DIAGNOSIS — I480 Paroxysmal atrial fibrillation: Secondary | ICD-10-CM

## 2018-09-18 LAB — POCT INR: INR: 3 (ref 2.0–3.0)

## 2018-09-18 NOTE — Patient Instructions (Addendum)
Pre visit review using our clinic review tool, if applicable. No additional management support is needed unless otherwise documented below in the visit note.  Take 1 tablet daily.  Re-check in 4 weeks.

## 2018-09-19 NOTE — Progress Notes (Signed)
Remote ICD transmission.   

## 2018-09-20 LAB — CUP PACEART REMOTE DEVICE CHECK
Battery Remaining Longevity: 144 mo
Battery Remaining Percentage: 100 %
Brady Statistic RV Percent Paced: 17 %
Date Time Interrogation Session: 20200113122700
HighPow Impedance: 38 Ohm
Implantable Lead Implant Date: 20060209
Implantable Lead Location: 753860
Implantable Lead Model: 158
Implantable Lead Serial Number: 156422
Implantable Pulse Generator Implant Date: 20150904
Lead Channel Impedance Value: 355 Ohm
Lead Channel Pacing Threshold Amplitude: 1 V
Lead Channel Pacing Threshold Pulse Width: 0.4 ms
Lead Channel Setting Pacing Amplitude: 2.4 V
Lead Channel Setting Pacing Pulse Width: 0.4 ms
Lead Channel Setting Sensing Sensitivity: 0.5 mV
Pulse Gen Serial Number: 191056

## 2018-09-22 ENCOUNTER — Encounter: Payer: Self-pay | Admitting: Neurology

## 2018-09-22 ENCOUNTER — Ambulatory Visit: Payer: Medicare HMO | Admitting: Neurology

## 2018-09-22 ENCOUNTER — Other Ambulatory Visit: Payer: Self-pay

## 2018-09-22 VITALS — BP 108/66 | HR 80 | Ht 72.0 in | Wt 214.0 lb

## 2018-09-22 DIAGNOSIS — F0390 Unspecified dementia without behavioral disturbance: Secondary | ICD-10-CM

## 2018-09-22 DIAGNOSIS — R69 Illness, unspecified: Secondary | ICD-10-CM | POA: Diagnosis not present

## 2018-09-22 DIAGNOSIS — F03A0 Unspecified dementia, mild, without behavioral disturbance, psychotic disturbance, mood disturbance, and anxiety: Secondary | ICD-10-CM

## 2018-09-22 MED ORDER — ESCITALOPRAM OXALATE 20 MG PO TABS: 20.0000 mg | ORAL_TABLET | Freq: Every day | ORAL | 11 refills | Status: DC

## 2018-09-22 MED ORDER — DONEPEZIL HCL 10 MG PO TABS: ORAL_TABLET | ORAL | 11 refills | Status: DC

## 2018-09-22 NOTE — Progress Notes (Signed)
NEUROLOGY FOLLOW UP OFFICE NOTE  Collin [name] [id number] [birth year]  HISTORY OF PRESENT ILLNESS: I had the pleasure of seeing Collin [name] in follow-up in the neurology clinic on 09/22/2018.  The patient was last seen 7 months ago for worsening memory. He is again accompanied by his wife who helps supplement the history today.  Records and images were personally reviewed where available. MMSE 25/30 in May 2019. I personally reviewed head CT with and without contrast done July 2019 which did not show any acute changes, there was mild diffuse atrophy. TSH and B12 normal. His wife was reporting more mood changes, we agreed to start Lexapro 10mg  daily. She reports that the Lexapro has helped because he is doing more things and does not sit in front of the TV as much, but he is still grumpy and angry a lot. He gets aggravated by his forgetfulness, he would need something and have to sit and think where it is. His wife has noticed he gets confused about how to use the TV remote control. She is not sure if he can use a phone without help. She had open heart surgery in the summer and he has had to do the driving. She has no concerns about his driving skills, but notices he has difficulties remembering directions. She is always in the car with him. She manages his medications, she has set up alarms because he would forget them even if she reminded him. Wife manages finances. No paranoia or hallucinations. He is independent with dressing and bathing. He denies any headaches, dizziness, vision changes, no falls.   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 He started noticing changes around a year ago, soon after diagnosis of cancer. He  describes his memory as "terrible," he would go upstairs and stop in the hallway forgetting what he went for. He could not find his eyeglasses and got very frustrated, berating himself for 40 minutes, calling himself stupid. His wife reports minor memory issues for a few years, but more noticeable in the past year. He used to manage his own medications, but at that time she would find full bottles of medications that should have been empty. She took over medications and now fixes his pills weekly. He states he does not drive as much and denies getting lost driving, his wife corrects him and reminds him he stopped driving November 2017 when he was having episodes with his ICD and was told not to drive. She feels he would have gotten lost, she always gives him instructions driving and he would panic if he did not know where he was. The other day at home he could not find their drinking glasses and was opening all the cabinets in the kitchen. He repeats stories and questions. He is independent with dressing and bathing, but his wife is never sure if he brushes his teeth, he gets mad if she reminds him. She states "he is a good actor." She reports he is friendly to everyone, talking to strangers, but once they get into the car, "he is a grump, angry old man," taking it out on her all the time. She reports since the cancer diagnosis, things have been a lot worse, she thinks he is depressed. He states he is  frustrated because he cannot do a lot of things he used to do.  He gets dizzy a lot when standing and has to get his bearings first. He has missteps and falls, he fell yesterday and had abrasions on his left arm. His vision is blurred. He has had swallowing difficulties since the surgery and radiation. He has a feeding tube but mostly is able to swallow by mouth now. No neck pain, some back discomfort. He gets left arm numbness when sitting for a long time. He has swelling on his left ankle, his wife reports it is  "collapsed" for years, but he has never been evaluated for it. He has constipation, no incontinence, no anosmia or tremors. His father had memory issues. No history of significant head injuries or alcohol use. He finished up to the 9th grade.  PAST MEDICAL HISTORY: Past Medical History:  Diagnosis Date  . AICD (automatic cardioverter/defibrillator) present   . Atrial fibrillation (HCC)   . Cancer (HCC)    tongue cancer  . Cardiomyopathy, nonischemic (HCC) 1998  . CHF (congestive heart failure) (HCC)   . Claustrophobia   . Diabetes mellitus   . Dysrhythmia   . Heart disease   . History of echocardiogram    Echo 8/18:  EF 55-60, no RWMA, severe LAE, normal RVSF, mild RAE  . History of radiation therapy 12/07/16- 01/24/17   Base of Tongue 70 Gy in 35 fractions  . Hyperlipidemia   . Memory loss   . OSA (obstructive sleep apnea)   . Shingles   . Umbilical hernia   . 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 It sometimes shows as a shadow on scans.     MEDICATIONS: Current Outpatient Medications on File Prior to Visit  Medication Sig Dispense Refill  . acetaminophen (TYLENOL) 500 MG tablet Take 1,000 mg by mouth daily as needed for moderate pain.     Marland Kitchen albuterol (PROVENTIL HFA;VENTOLIN HFA) 108 (90 Base) MCG/ACT inhaler Inhale 2 puffs into the lungs every 4 (four) hours as needed for wheezing or shortness of breath. 1 Inhaler 0  . carvedilol (COREG) 25 MG tablet TAKE ONE TABLET BY MOUTH TWICE A DAY NEEDS APPOINTMENT 60 tablet 0  . diltiazem (CARDIZEM) 90 MG tablet Take 1 tablet (90 mg total) by mouth daily. 90 tablet 3  . escitalopram (LEXAPRO) 10 MG tablet Take 1 tablet (10 mg total) by mouth daily. 30 tablet 11  . guaifenesin (ROBITUSSIN) 100 MG/5ML syrup Take 200 mg by mouth at bedtime as needed for cough.    . polyethylene glycol (MIRALAX) packet Take 17 g by mouth daily as needed. Available OTC 30 each 0  . potassium chloride  (K-DUR,KLOR-CON) 10 MEQ tablet DISSOLVE  1 TABLET IN WATER AND ADMINISTER VIA PEG TUDE DAILY 90 tablet 3  . rosuvastatin (CRESTOR) 10 MG tablet Take 1 tablet (10 mg total) by mouth daily. 90 tablet 3  . sodium fluoride (DENTA 5000 PLUS) 1.1 % CREA dental cream Apply gel to tooth brush. Brush teeth for 2 minutes. Spit out excess-DO NOT swallow. Repeat nightly. 1 Tube 0  . torsemide (DEMADEX) 20 MG tablet Take 20-40 mg by mouth as directed. 20mg  daily by by mouth except Tuesday and Saturday take 40 mg.    . warfarin (COUMADIN) 5 MG tablet Take 1-1.5 tablets (5-7.5 mg total) by mouth See admin instructions. TAKE AS DIRECTED BY ANTICOAGULATION CLINIC. Take 5 mg daily except  Wednesday and Saturday take 7.5 mg normally  **For the next 3 days take 7.5 mg Daily and go back to regular schedule 3/18. Follow up with Coumadin Clinic Monday for INR Check (Patient taking differently: Take 5-7.5 mg by mouth See admin instructions. Take 5mg  once daily at night except on Wednesdays take 7.5 mg at night)    . warfarin (COUMADIN) 5 MG tablet Take 1 tablet daily or AS DIRECTED BY ANTICOAGULATION CLINIC. 100 tablet 0   No current facility-administered medications on file prior to visit.     ALLERGIES: Allergies  Allergen Reactions  . Niaspan [Niacin Er] Other (See Comments)    FLUSHED    FAMILY HISTORY: Family History  Problem Relation Age of Onset  . Heart disease Mother   . Sudden death Mother        under age 31  . Lupus Mother   . Hypertension Father   . Stroke Father     SOCIAL HISTORY: Social History   Socioeconomic History  . Marital status: Married    Spouse name: [name]  . Number of children: 3  . Years of education: Not on file  . Highest education level: Not on file  Occupational History  . Occupation: Retired Engineer, materials  Social Needs  . Financial resource strain: Not on file  . Food insecurity:    Worry: Not on file    Inability: Not on file  . Transportation needs:     Medical: Not on file    Non-medical: Not on file  Tobacco Use  . Smoking status: Never Smoker  . Smokeless tobacco: Never Used  Substance and Sexual Activity  . Alcohol use: Yes    Comment: "a little wine every once in a while" 01/17/2018  . Drug use: No  . Sexual activity: Not on file  Lifestyle  . Physical activity:    Days per week: Not on file    Minutes per session: Not on file  . Stress: Not on file  Relationships  . Social connections:    Talks on phone: Not on file    Gets together: Not on file    Attends religious service: Not on file    Active member of club or organization: Not on file    Attends meetings of clubs or organizations: Not on file    Relationship status: Not on file  . Intimate partner violence:    Fear of current or ex partner: Not on file    Emotionally abused: Not on file    Physically abused: Not on file    Forced sexual activity: Not on file  Other Topics Concern  . Not on file  Social History Narrative   Pt lives in split level home with his wife, Aggie Cosier.   Has 3 sons   9th grade education   Retired Electrical engineer    REVIEW OF SYSTEMS: Constitutional: No fevers, chills, or sweats, no generalized fatigue, change in appetite Eyes: No visual changes, double vision, eye pain Ear, nose and throat: No hearing loss, ear pain, nasal congestion, sore throat Cardiovascular: No chest pain, palpitations Respiratory:  No shortness of breath at rest or with exertion, wheezes GastrointestinaI: No nausea, vomiting, diarrhea, abdominal pain, fecal incontinence Genitourinary:  No dysuria, urinary retention or frequency Musculoskeletal:  No neck pain, back pain Integumentary: No rash, pruritus, skin lesions Neurological: as above Psychiatric: + depression, anxiety Endocrine: No palpitations, fatigue, diaphoresis, mood swings, change in appetite, change in weight, increased thirst Hematologic/Lymphatic:  No anemia,  purpura, petechiae. Allergic/Immunologic:  no itchy/runny eyes, nasal congestion, recent allergic reactions, rashes  PHYSICAL EXAM: Vitals:   09/22/18 1524  BP: 108/66  Pulse: 80  SpO2: 98%   General: No acute distress Head:  Normocephalic/atraumatic Neck: supple, no paraspinal tenderness, full range of motion Heart:  Regular rate and rhythm Lungs:  Clear to auscultation bilaterally Back: No paraspinal tenderness Skin/Extremities: No rash, no edema Neurological Exam: alert and oriented to person, place, season/year. No aphasia or dysarthria. Fund of knowledge is appropriate.  Recent and remote memory are intact.  Attention and concentration are normal, he refused to spell WORLD backwards (was able to do on prior visit).    Able to name objects and repeat phrases. CDT 5/5 MMSE - Mini Mental State Exam 09/22/2018 01/31/2018  Orientation to time 2 3  Orientation to Place 4 5  Registration 3 3  Attention/ Calculation 0 5  Recall 2 1  Language- name 2 objects 2 2  Language- repeat 1 1  Language- follow 3 step command 3 2  Language- read & follow direction 1 1  Write a sentence 1 1  Copy design 1 1  Total score 20 25    Cranial nerves: Pupils equal, round, reactive to light. Extraocular movements intact with no nystagmus. Visual fields full. Facial sensation intact. No facial asymmetry. Tongue, uvula, palate midline.  Motor: Bulk and tone normal, muscle strength 5/5 throughout with no pronator drift.  Sensation to light touch intact.  No extinction to double simultaneous stimulation.  Finger to nose testing intact.  Gait slow and cautious with cane, no ataxia.  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 MMSE today 20/30 (25/30 in May 2019). 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 Head CT no acute changes with mild diffuse atrophy. We discussed starting Donepezil, including side effects and expectations from the medication. He is also having more mood changes, increase Lexapro to 20mg  daily. Continue to monitor driving, would always have wife present while driving. We again discussed the importance of physical exercise, brain stimulation exercises, and control of vascular risk factors for brain health. He will follow-up in 6 months and knows to call for any changes.  Thank you for allowing me to participate in his care.  Please do not hesitate to call for any questions or concerns.  The duration of this appointment visit was 30 minutes of face-to-face time with the patient.  Greater than 50% of this time was spent in counseling, explanation of diagnosis, planning of further management, and coordination of care.   Patrcia Dolly, M.D.   CC: Dr. Caryl Never

## 2018-09-22 NOTE — Patient Instructions (Signed)
1. Start Donepezil 10mg : take 1/2 tablet daily for 2 weeks, then increase to 1 tablet daily  2. Increase Lexapro (escitalopram) to 20mg  daily. With your current prescription of Lexapro 10mg , take 2 tablets daily. Once done, your new bottle will be for Lexapro 20mg , take 1 tablet daily  3. Follow-up in 6 months, call for any changes  FALL PRECAUTIONS: Be cautious when walking. Scan the area for obstacles that may increase the risk of trips and falls. When getting up in the mornings, sit up at the edge of the bed for a few minutes before getting out of bed. Consider elevating the bed at the head end to avoid drop of blood pressure when getting up. Walk always in a well-lit room (use night lights in the walls). Avoid area rugs or power cords from appliances in the middle of the walkways. Use a walker or a cane if necessary and consider physical therapy for balance exercise. Get your eyesight checked regularly.  HOME SAFETY: Consider the safety of the kitchen when operating appliances like stoves, microwave oven, and blender. Consider having supervision and share cooking responsibilities until no longer able to participate in those. Accidents with firearms and other hazards in the house should be identified and addressed as well.  DRIVING: Regarding driving, in patients with progressive memory problems, driving will be impaired. We advise to have someone else do the driving if trouble finding directions or if minor accidents are reported. Independent driving assessment is available to determine safety of driving.  ABILITY TO BE LEFT ALONE: If patient is unable to contact 911 operator, consider using LifeLine, or when the need is there, arrange for someone to stay with patients. Smoking is a fire hazard, consider supervision or cessation. Risk of wandering should be assessed by caregiver and if detected at any point, supervision and safe proof recommendations should be instituted.  RECOMMENDATIONS FOR ALL  PATIENTS WITH MEMORY PROBLEMS: 1. Continue to exercise (Recommend 30 minutes of walking everyday, or 3 hours every week) 2. Increase social interactions - continue going to [place] and enjoy social gatherings with friends and family 3. Eat healthy, avoid fried foods and eat more fruits and vegetables 4. Maintain adequate blood pressure, blood sugar, and blood cholesterol level. Reducing the risk of stroke and cardiovascular disease also helps promoting better memory. 5. Avoid stressful situations. Live a simple life and avoid aggravations. Organize your time and prepare for the next day in anticipation. 6. Sleep well, avoid any interruptions of sleep and avoid any distractions in the bedroom that may interfere with adequate sleep quality 7. Avoid sugar, avoid sweets as there is a strong link between excessive sugar intake, diabetes, and cognitive impairment The Mediterranean diet has been shown to help patients reduce the risk of progressive memory disorders and reduces cardiovascular risk. This includes eating fish, eat fruits and green leafy vegetables, nuts like almonds and hazelnuts, walnuts, and also use olive oil. Avoid fast foods and fried foods as much as possible. Avoid sweets and sugar as sugar use has been linked to worsening of memory function.  There is always a concern of gradual progression of memory problems. If this is the case, then we may need to adjust level of care according to patient needs. Support, both to the patient and caregiver, should then be put into place.

## 2018-09-26 ENCOUNTER — Encounter: Payer: Self-pay | Admitting: Neurology

## 2018-10-04 ENCOUNTER — Other Ambulatory Visit: Payer: Self-pay | Admitting: Internal Medicine

## 2018-10-13 DIAGNOSIS — R32 Unspecified urinary incontinence: Secondary | ICD-10-CM | POA: Diagnosis not present

## 2018-10-13 DIAGNOSIS — N529 Male erectile dysfunction, unspecified: Secondary | ICD-10-CM | POA: Diagnosis not present

## 2018-10-13 DIAGNOSIS — Z7901 Long term (current) use of anticoagulants: Secondary | ICD-10-CM | POA: Diagnosis not present

## 2018-10-13 DIAGNOSIS — E785 Hyperlipidemia, unspecified: Secondary | ICD-10-CM | POA: Diagnosis not present

## 2018-10-13 DIAGNOSIS — I110 Hypertensive heart disease with heart failure: Secondary | ICD-10-CM | POA: Diagnosis not present

## 2018-10-13 DIAGNOSIS — R69 Illness, unspecified: Secondary | ICD-10-CM | POA: Diagnosis not present

## 2018-10-13 DIAGNOSIS — G4730 Sleep apnea, unspecified: Secondary | ICD-10-CM | POA: Diagnosis not present

## 2018-10-13 DIAGNOSIS — I509 Heart failure, unspecified: Secondary | ICD-10-CM | POA: Diagnosis not present

## 2018-10-13 DIAGNOSIS — I4891 Unspecified atrial fibrillation: Secondary | ICD-10-CM | POA: Diagnosis not present

## 2018-10-14 ENCOUNTER — Other Ambulatory Visit: Payer: Self-pay | Admitting: Family Medicine

## 2018-10-16 ENCOUNTER — Ambulatory Visit (INDEPENDENT_AMBULATORY_CARE_PROVIDER_SITE_OTHER): Payer: Medicare HMO | Admitting: General Practice

## 2018-10-16 DIAGNOSIS — I480 Paroxysmal atrial fibrillation: Secondary | ICD-10-CM

## 2018-10-16 DIAGNOSIS — Z7901 Long term (current) use of anticoagulants: Secondary | ICD-10-CM | POA: Diagnosis not present

## 2018-10-16 LAB — POCT INR: INR: 3 (ref 2.0–3.0)

## 2018-10-16 NOTE — Patient Instructions (Addendum)
Pre visit review using our clinic review tool, if applicable. No additional management support is needed unless otherwise documented below in the visit note.  Take 1 tablet daily.  Re-check in 4 weeks.

## 2018-11-04 ENCOUNTER — Other Ambulatory Visit: Payer: Self-pay | Admitting: Internal Medicine

## 2018-11-12 ENCOUNTER — Other Ambulatory Visit: Payer: Self-pay | Admitting: Family Medicine

## 2018-11-13 ENCOUNTER — Ambulatory Visit (INDEPENDENT_AMBULATORY_CARE_PROVIDER_SITE_OTHER): Payer: Medicare HMO | Admitting: General Practice

## 2018-11-13 DIAGNOSIS — Z7901 Long term (current) use of anticoagulants: Secondary | ICD-10-CM

## 2018-11-13 DIAGNOSIS — I480 Paroxysmal atrial fibrillation: Secondary | ICD-10-CM

## 2018-11-13 LAB — POCT INR: INR: 4 — AB (ref 2.0–3.0)

## 2018-11-13 NOTE — Patient Instructions (Addendum)
Pre visit review using our clinic review tool, if applicable. No additional management support is needed unless otherwise documented below in the visit note.  Hold coumadin today and tomorrow and then start taking 1 tablet daily except 1/2 tablet on Sundays. Re-check in 4 weeks.

## 2018-11-13 NOTE — Telephone Encounter (Signed)
Coumadin refill request. Thanks!

## 2018-12-07 DIAGNOSIS — Z85810 Personal history of malignant neoplasm of tongue: Secondary | ICD-10-CM | POA: Diagnosis not present

## 2018-12-07 DIAGNOSIS — R590 Localized enlarged lymph nodes: Secondary | ICD-10-CM | POA: Diagnosis not present

## 2018-12-08 ENCOUNTER — Other Ambulatory Visit: Payer: Self-pay | Admitting: Otolaryngology

## 2018-12-08 DIAGNOSIS — R590 Localized enlarged lymph nodes: Secondary | ICD-10-CM

## 2018-12-09 ENCOUNTER — Other Ambulatory Visit: Payer: Self-pay | Admitting: Internal Medicine

## 2018-12-11 ENCOUNTER — Other Ambulatory Visit: Payer: Self-pay

## 2018-12-11 ENCOUNTER — Ambulatory Visit (INDEPENDENT_AMBULATORY_CARE_PROVIDER_SITE_OTHER): Payer: Medicare HMO | Admitting: General Practice

## 2018-12-11 DIAGNOSIS — Z7901 Long term (current) use of anticoagulants: Secondary | ICD-10-CM

## 2018-12-11 DIAGNOSIS — I480 Paroxysmal atrial fibrillation: Secondary | ICD-10-CM

## 2018-12-11 LAB — POCT INR: INR: 2.1 (ref 2.0–3.0)

## 2018-12-11 NOTE — Patient Instructions (Signed)
Pre visit review using our clinic review tool, if applicable. No additional management support is needed unless otherwise documented below in the visit note.  Continue to take 1 tablet daily except 1/2 tablet on Sundays.  Re-check in 6 weeks.  

## 2018-12-17 ENCOUNTER — Other Ambulatory Visit: Payer: Self-pay | Admitting: Family Medicine

## 2018-12-18 ENCOUNTER — Ambulatory Visit (INDEPENDENT_AMBULATORY_CARE_PROVIDER_SITE_OTHER): Payer: Medicare HMO | Admitting: Family Medicine

## 2018-12-18 ENCOUNTER — Ambulatory Visit (INDEPENDENT_AMBULATORY_CARE_PROVIDER_SITE_OTHER): Payer: Medicare HMO | Admitting: *Deleted

## 2018-12-18 ENCOUNTER — Other Ambulatory Visit: Payer: Self-pay

## 2018-12-18 DIAGNOSIS — I5032 Chronic diastolic (congestive) heart failure: Secondary | ICD-10-CM

## 2018-12-18 DIAGNOSIS — G5601 Carpal tunnel syndrome, right upper limb: Secondary | ICD-10-CM

## 2018-12-18 DIAGNOSIS — I428 Other cardiomyopathies: Secondary | ICD-10-CM

## 2018-12-18 DIAGNOSIS — I4819 Other persistent atrial fibrillation: Secondary | ICD-10-CM | POA: Diagnosis not present

## 2018-12-18 DIAGNOSIS — E785 Hyperlipidemia, unspecified: Secondary | ICD-10-CM | POA: Diagnosis not present

## 2018-12-18 LAB — CUP PACEART REMOTE DEVICE CHECK
Battery Remaining Longevity: 138 mo
Battery Remaining Percentage: 100 %
Brady Statistic RV Percent Paced: 17 %
Date Time Interrogation Session: 20200413082500
HighPow Impedance: 40 Ohm
Implantable Lead Implant Date: 20060209
Implantable Lead Location: 753860
Implantable Lead Model: 158
Implantable Lead Serial Number: 156422
Implantable Pulse Generator Implant Date: 20150904
Lead Channel Impedance Value: 380 Ohm
Lead Channel Pacing Threshold Amplitude: 1 V
Lead Channel Pacing Threshold Pulse Width: 0.4 ms
Lead Channel Setting Pacing Amplitude: 2.4 V
Lead Channel Setting Pacing Pulse Width: 0.4 ms
Lead Channel Setting Sensing Sensitivity: 0.5 mV
Pulse Gen Serial Number: 191056

## 2018-12-18 MED ORDER — CARVEDILOL 25 MG PO TABS: ORAL_TABLET | ORAL | 3 refills | Status: DC

## 2018-12-18 MED ORDER — TORSEMIDE 20 MG PO TABS: 20.0000 mg | ORAL_TABLET | Freq: Every day | ORAL | 3 refills | Status: DC

## 2018-12-18 NOTE — Progress Notes (Signed)
Patient ID: Collin [name], male   DOB: 1941/07/21, 78 y.o.   MRN: [id number]  Virtual Visit via Video Note  I connected with Jacinto Halim on 12/18/18 at 11:00 AM EDT by a video enabled telemedicine application and verified that I am speaking with the correct person using two identifiers.  Location patient: home Location provider:work or home office Persons participating in the virtual visit: patient, provider  I discussed the limitations of evaluation and management by telemedicine and the availability of in person appointments. The patient expressed understanding and agreed to proceed.   HPI: Patient had called requesting refills of carvedilol and Demadex.  He has history of obesity, nonischemic cardiomyopathy, chronic diastolic heart failure, atrial fibrillation, history of tongue neoplasm, obstructive sleep apnea, hyperglycemia, hyperlipidemia.  He had some recent small masses right posterior neck region and he followed up with ENT and they have ordered a PET scan.  Concern is whether these are some enlarged lymph nodes.  New problem of past few weeks has had some numbness involving the first through third digits of the right hand.  He describes some weakness.  Minimal pain.  No injury.  No neck pain.  Has been raking his yard a lot lately.  Denies prior history of carpal tunnel syndrome  Patient states his respiratory status is stable.  No chest pains.  No appetite or weight changes.  No fever.  No cough.  Remains on Coumadin for atrial fibrillation.  He is getting regular protimes through our Coumadin clinic   ROS: See pertinent positives and negatives per HPI.  Past Medical History:  Diagnosis Date  . AICD (automatic cardioverter/defibrillator) present   . Atrial fibrillation (HCC)   . Cancer (HCC)    tongue cancer  . Cardiomyopathy, nonischemic (HCC) 1998  . CHF (congestive heart failure) (HCC)   . Claustrophobia   . Diabetes mellitus   . Dysrhythmia   . Heart disease    . History of echocardiogram    Echo 8/18:  EF 55-60, no RWMA, severe LAE, normal RVSF, mild RAE  . History of radiation therapy 12/07/16- 01/24/17   Base of Tongue 70 Gy in 35 fractions  . Hyperlipidemia   . Memory loss   . OSA (obstructive sleep apnea)   . Shingles   . Umbilical hernia   . 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 It sometimes shows as a shadow on scans.     Past Surgical History:  Procedure Laterality Date  . CARDIAC DEFIBRILLATOR PLACEMENT  2006  . CATARACT EXTRACTION Bilateral    01/07/2016. 01/22/2016  . CYST EXCISION     multiple drainage to a cyst in his neck.   Marland Kitchen DIRECT LARYNGOSCOPY N/A 11/16/2016   Procedure: DIRECT LARYNGOSCOPY AND BIOPSY;  Surgeon: [name], MD;  Location: [facility] OR;  Service: ENT;  Laterality: N/A;  . EYE SURGERY    . HERNIA REPAIR  1950s  . IMPLANTABLE CARDIOVERTER DEFIBRILLATOR (ICD) GENERATOR CHANGE N/A 05/10/2014   Procedure: ICD GENERATOR CHANGE;  Surgeon: [name], MD;  Location[facility] C[facility] LAB;  Service: Cardiovascular;  Laterality: N/A;  . INSERTION OF MESH N/A 01/18/2018   Procedure: INSERTION OF MESH;  Surgeon: [name], MD;  Location: [facility] OR;  Service: General;  Laterality: N/A;  . IR GASTROSTOMY TUBE MOD SED  12/27/2016  . IR GASTROSTOMY TUBE REMOVAL  03/24/2018  . IR PATIENT EVAL TECH 0-60 MINS  01/17/2017  .  MASS EXCISION Right 11/16/2016   Procedure: EXCISION RIGHT NECK LIPOMA;  Surgeon: [name], MD;  Location: [facility] OR;  Service: ENT;  Laterality: Right;  . MOHS SURGERY  01/01/2015   to top of head  . MOUTH SURGERY     teeth removed 4 teeth extracted 02/2010  . RADICAL NECK DISSECTION Right 07/25/2017   Procedure: RIGHT NECK DISSECTION;  Surgeon: [name], MD;  Location: [facility] OR;  Service: ENT;  Laterality: Right;  . UMBILICAL HERNIA REPAIR  01/18/2018   w/mesh  . UMBILICAL HERNIA REPAIR N/A 01/18/2018   Procedure: UMBILICAL HERNIA REPAIR  WITH MESH;  Surgeon: [name], MD;  Location: [facility] OR;  Service: General;  Laterality: N/A;    Family History  Problem Relation Age of Onset  . Heart disease Mother   . Sudden death Mother        under age 69  . Lupus Mother   . Hypertension Father   . Stroke Father     SOCIAL HX: Never smoked.   Current Outpatient Medications:  .  acetaminophen (TYLENOL) 500 MG tablet, Take 1,000 mg by mouth daily as needed for moderate pain. , Disp: , Rfl:  .  albuterol (PROVENTIL HFA;VENTOLIN HFA) 108 (90 Base) MCG/ACT inhaler, Inhale 2 puffs into the lungs every 4 (four) hours as needed for wheezing or shortness of breath., Disp: 1 Inhaler, Rfl: 0 .  carvedilol (COREG) 25 MG tablet, TAKE ONE TABLET BY MOUTH TWICE A DAY   NEEDS APPOINTMENT, Disp: 180 tablet, Rfl: 3 .  diltiazem (CARDIZEM) 90 MG tablet, TAKE ONE TABLET BY MOUTH DAILY, Disp: 90 tablet, Rfl: 2 .  donepezil (ARICEPT) 10 MG tablet, Take 1/2 tablet daily for 2 weeks, then increase to 1 tablet daily, Disp: 30 tablet, Rfl: 11 .  escitalopram (LEXAPRO) 20 MG tablet, Take 1 tablet (20 mg total) by mouth daily., Disp: 30 tablet, Rfl: 11 .  guaifenesin (ROBITUSSIN) 100 MG/5ML syrup, Take 200 mg by mouth at bedtime as needed for cough., Disp: , Rfl:  .  polyethylene glycol (MIRALAX) packet, Take 17 g by mouth daily as needed. Available OTC, Disp: 30 each, Rfl: 0 .  potassium chloride (K-DUR,KLOR-CON) 10 MEQ tablet, DISSOLVE  1 TABLET IN WATER AND ADMINISTER VIA PEG TUDE DAILY, Disp: 90 tablet, Rfl: 3 .  rosuvastatin (CRESTOR) 10 MG tablet, TAKE ONE TABLET BY MOUTH DAILY, Disp: 90 tablet, Rfl: 0 .  sodium fluoride (DENTA 5000 PLUS) 1.1 % CREA dental cream, Apply gel to tooth brush. Brush teeth for 2 minutes. Spit out excess-DO NOT swallow. Repeat nightly., Disp: 1 Tube, Rfl: 0 .  torsemide (DEMADEX) 20 MG tablet, Take 1 tablet (20 mg total) by mouth daily., Disp: 90 tablet, Rfl: 3 .  warfarin (COUMADIN) 5 MG tablet, Take 1-1.5 tablets (5-7.5 mg  total) by mouth See admin instructions. TAKE AS DIRECTED BY ANTICOAGULATION CLINIC. Take 5 mg daily except Wednesday and Saturday take 7.5 mg normally  **For the next 3 days take 7.5 mg Daily and go back to regular schedule 3/18. Follow up with Coumadin Clinic Monday for INR Check (Patient taking differently: Take 5-7.5 mg by mouth See admin instructions. Take 5mg  once daily at night except on Wednesdays take 7.5 mg at night), Disp: , Rfl:  .  warfarin (COUMADIN) 5 MG tablet, TAKE 1 TABLET BY MOUTH DAILY OR AS DIRECTED BY ANTICOAGULATION CLINIC, Disp: 100 tablet, Rfl: 1  EXAM:  VITALS per patient if applicable:  GENERAL: alert, oriented, appears well  and in no acute distress  HEENT: atraumatic, conjunttiva clear, no obvious abnormalities on inspection of external nose and ears  NECK: normal movements of the head and neck  LUNGS: on inspection no signs of respiratory distress, breathing rate appears normal, no obvious gross SOB, gasping or wheezing  CV: no obvious cyanosis  MS: moves all visible extremities without noticeable abnormality  PSYCH/NEURO: pleasant and cooperative, no obvious depression or anxiety, speech and thought processing grossly intact  ASSESSMENT AND PLAN:  Discussed the following assessment and plan:  #1 history of nonischemic cardiomyopathy-stable symptomatically -Refilled carvedilol and Demadex for 1 year -Needs follow-up electrolyte panel and hopefully this can be done within the next few months  #2 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 -Recommend wrist splinting especially at night for the next several weeks.  Touch base if not improving over the next few weeks  #3 history of chronic atrial fibrillation-on Coumadin -Continue close follow-up with Coumadin clinic  #4 history of diastolic heart failure.  Weight stable. -Continue to monitor daily weights -Refill Demadex -Follow-up electrolytes when safe to return to office     I discussed the  assessment and treatment plan with the patient. The patient was provided an opportunity to ask questions and all were answered. The patient agreed with the plan and demonstrated an understanding of the instructions.   The patient was advised to call back or seek an in-person evaluation if the symptoms worsen or if the condition fails to improve as anticipated   Evelena Peat, MD

## 2018-12-18 NOTE — Telephone Encounter (Signed)
Patient has a med refill Doxy appt today at 11am

## 2018-12-28 NOTE — Progress Notes (Signed)
Remote ICD transmission.   

## 2019-01-09 ENCOUNTER — Encounter (HOSPITAL_COMMUNITY)
Admission: RE | Admit: 2019-01-09 | Discharge: 2019-01-09 | Disposition: A | Discharge status: Home / Self Care | Payer: Medicare HMO | Source: Ambulatory Visit | Attending: Otolaryngology | Admitting: Otolaryngology

## 2019-01-09 ENCOUNTER — Telehealth: Payer: Self-pay | Admitting: Internal Medicine

## 2019-01-09 DIAGNOSIS — R590 Localized enlarged lymph nodes: Secondary | ICD-10-CM | POA: Diagnosis not present

## 2019-01-09 DIAGNOSIS — K8020 Calculus of gallbladder without cholecystitis without obstruction: Secondary | ICD-10-CM | POA: Insufficient documentation

## 2019-01-09 DIAGNOSIS — N400 Enlarged prostate without lower urinary tract symptoms: Secondary | ICD-10-CM | POA: Insufficient documentation

## 2019-01-09 DIAGNOSIS — I700 Atherosclerosis of aorta: Secondary | ICD-10-CM | POA: Diagnosis not present

## 2019-01-09 DIAGNOSIS — I2510 Atherosclerotic heart disease of native coronary artery without angina pectoris: Secondary | ICD-10-CM | POA: Insufficient documentation

## 2019-01-09 DIAGNOSIS — N62 Hypertrophy of breast: Secondary | ICD-10-CM | POA: Insufficient documentation

## 2019-01-09 DIAGNOSIS — C01 Malignant neoplasm of base of tongue: Secondary | ICD-10-CM | POA: Diagnosis not present

## 2019-01-09 LAB — GLUCOSE, CAPILLARY: Glucose-Capillary: 82 mg/dL (ref 70–99)

## 2019-01-09 MED ORDER — FLUDEOXYGLUCOSE F - 18 (FDG) INJECTION
10.7500 | Freq: Once | INTRAVENOUS | Status: AC
  Administered 2019-01-09: 10.75 via INTRAVENOUS

## 2019-01-09 NOTE — Telephone Encounter (Signed)
New message   [facility] for pt to call and schedule virtual visit with Dr. Lovena Le. Will offer doxemity video if pt has a smart phone.

## 2019-01-10 ENCOUNTER — Telehealth: Payer: Self-pay | Admitting: Internal Medicine

## 2019-01-10 NOTE — Telephone Encounter (Signed)
Pt. Wife has a smart phone [phone].     Virtual Visit Pre-Appointment Phone Call  "(Name), I am calling you today to discuss your upcoming appointment. We are currently trying to limit exposure to the virus that causes COVID-19 by seeing patients at home rather than in the office."  1. "What is the BEST phone number to call the day of the visit?" - include this in appointment notes  2. Do you have or have access to (through a family member/friend) a smartphone with video capability that we can use for your visit?" a. If yes - list this number in appt notes as cell (if different from BEST phone #) and list the appointment type as a VIDEO visit in appointment notes b. If no - list the appointment type as a PHONE visit in appointment notes  3. Confirm consent - "In the setting of the current Covid19 crisis, you are scheduled for a (phone or video) visit with your provider on (date) at (time).  Just as we do with many in-office visits, in order for you to participate in this visit, we must obtain consent.  If you'd like, I can send this to your mychart (if signed up) or email for you to review.  Otherwise, I can obtain your verbal consent now.  All virtual visits are billed to your insurance company just like a normal visit would be.  By agreeing to a virtual visit, we'd like you to understand that the technology does not allow for your provider to perform an examination, and thus may limit your provider's ability to fully assess your condition. If your provider identifies any concerns that need to be evaluated in person, we will make arrangements to do so.  Finally, though the technology is pretty good, we cannot assure that it will always work on either your or our end, and in the setting of a video visit, we may have to convert it to a phone-only visit.  In either situation, we cannot ensure that we have a secure connection.  Are you willing to proceed?" YES  4. Advise patient to be prepared -  "Two hours prior to your appointment, go ahead and check your blood pressure, pulse, oxygen saturation, and your weight (if you have the equipment to check those) and write them all down. When your visit starts, your provider will ask you for this information. If you have an Apple Watch or Kardia device, please plan to have heart rate information ready on the day of your appointment. Please have a pen and paper handy nearby the day of the visit as well."  5. Give patient instructions for MyChart download to smartphone OR [email or web address] as below if video visit (depending on what platform provider is using)  6. Inform patient they will receive a phone call 15 minutes prior to their appointment time (may be from unknown caller ID) so they should be prepared to answer    TELEPHONE CALL NOTE  Collin [name] has been deemed a candidate for a follow-up tele-health visit to limit community exposure during the Covid-19 pandemic. I spoke with the patient via phone to ensure availability of phone/video source, confirm preferred email & phone number, and discuss instructions and expectations.  I reminded Collin [name] to be prepared with any vital sign and/or heart rhythm information that could potentially be obtained via home monitoring, at the time of his visit. I reminded Collin [name] to expect a phone call prior to his visit.  Collin Dakins  [name] 01/10/2019 4:48 PM   INSTRUCTIONS FOR DOWNLOADING THE MYCHART APP TO SMARTPHONE  - The patient must first make sure to have activated MyChart and know their login information - If Apple, go to [employer] and type in MyChart in the search bar and download the app. If Android, ask patient to go to [employer] and type in [place] in the search bar and download the app. The app is free but as with any other app downloads, their phone may require them to verify saved payment information or Apple/Android password.  - The patient will need to then log  into the app with their MyChart username and password, and select [facility] as their healthcare provider to link the account. When it is time for your visit, go to the MyChart app, find appointments, and click Begin Video Visit. Be sure to Select Allow for your device to access the Microphone and Camera for your visit. You will then be connected, and your provider will be with you shortly.  **If they have any issues connecting, or need assistance please contact MyChart service desk [phone])**  **If using a computer, in order to ensure the best quality for their visit they will need to use either of the following Internet Browsers: [employer], or Google Chrome**  IF USING DOXIMITY or DOXY.ME - The patient will receive a link just prior to their visit by text.     FULL LENGTH CONSENT FOR TELE-HEALTH VISIT   I hereby voluntarily request, consent and authorize [facility] and its employed or contracted physicians, physician assistants, nurse practitioners or other licensed health care professionals (the Practitioner), to provide me with telemedicine health care services (the Services") as deemed necessary by the treating Practitioner. I acknowledge and consent to receive the Services by the Practitioner via telemedicine. I understand that the telemedicine visit will involve communicating with the Practitioner through live audiovisual communication technology and the disclosure of certain medical information by electronic transmission. I acknowledge that I have been given the opportunity to request an in-person assessment or other available alternative prior to the telemedicine visit and am voluntarily participating in the telemedicine visit.  I understand that I have the right to withhold or withdraw my consent to the use of telemedicine in the course of my care at any time, without affecting my right to future care or treatment, and that the Practitioner or I may terminate  the telemedicine visit at any time. I understand that I have the right to inspect all information obtained and/or recorded in the course of the telemedicine visit and may receive copies of available information for a reasonable fee.  I understand that some of the potential risks of receiving the Services via telemedicine include:   Delay or interruption in medical evaluation due to technological equipment failure or disruption;  Information transmitted may not be sufficient (e.g. poor resolution of images) to allow for appropriate medical decision making by the Practitioner; and/or   In rare instances, security protocols could fail, causing a breach of personal health information.  Furthermore, I acknowledge that it is my responsibility to provide information about my medical history, conditions and care that is complete and accurate to the best of my ability. I acknowledge that Practitioner's advice, recommendations, and/or decision may be based on factors not within their control, such as incomplete or inaccurate data provided by me or distortions of diagnostic images or specimens that may result from electronic transmissions. I understand that the practice of  medicine is not an Chief Strategy Officer and that Practitioner makes no warranties or guarantees regarding treatment outcomes. I acknowledge that I will receive a copy of this consent concurrently upon execution via email to the email address I last provided but may also request a printed copy by calling the office of [place].    I understand that my insurance will be billed for this visit.   I have read or had this consent read to me.  I understand the contents of this consent, which adequately explains the benefits and risks of the Services being provided via telemedicine.   I have been provided ample opportunity to ask questions regarding this consent and the Services and have had my questions answered to my satisfaction.  I give my  informed consent for the services to be provided through the use of telemedicine in my medical care  By participating in this telemedicine visit I agree to the above.

## 2019-01-22 ENCOUNTER — Other Ambulatory Visit: Payer: Self-pay

## 2019-01-22 ENCOUNTER — Ambulatory Visit: Payer: Medicare HMO

## 2019-01-22 ENCOUNTER — Ambulatory Visit (INDEPENDENT_AMBULATORY_CARE_PROVIDER_SITE_OTHER): Payer: Medicare HMO | Admitting: General Practice

## 2019-01-22 DIAGNOSIS — Z7901 Long term (current) use of anticoagulants: Secondary | ICD-10-CM

## 2019-01-22 DIAGNOSIS — I480 Paroxysmal atrial fibrillation: Secondary | ICD-10-CM | POA: Diagnosis not present

## 2019-01-22 LAB — POCT INR: INR: 1.6 — AB (ref 2.0–3.0)

## 2019-01-22 NOTE — Patient Instructions (Signed)
Pre visit review using our clinic review tool, if applicable. No additional management support is needed unless otherwise documented below in the visit note.  Please take 1 1/2 tablets today and tomorrow and then continue to take 1 tablet daily except 1/2 tablet on Sundays. Re-check in 4 weeks.

## 2019-01-26 ENCOUNTER — Telehealth (INDEPENDENT_AMBULATORY_CARE_PROVIDER_SITE_OTHER): Payer: Medicare HMO | Admitting: Internal Medicine

## 2019-01-26 ENCOUNTER — Other Ambulatory Visit: Payer: Self-pay

## 2019-01-26 ENCOUNTER — Encounter: Payer: Self-pay | Admitting: Internal Medicine

## 2019-01-26 VITALS — BP 95/55 | HR 63 | Ht 72.0 in | Wt 200.0 lb

## 2019-01-26 DIAGNOSIS — E782 Mixed hyperlipidemia: Secondary | ICD-10-CM

## 2019-01-26 DIAGNOSIS — I4819 Other persistent atrial fibrillation: Secondary | ICD-10-CM | POA: Diagnosis not present

## 2019-01-26 DIAGNOSIS — I5042 Chronic combined systolic (congestive) and diastolic (congestive) heart failure: Secondary | ICD-10-CM

## 2019-01-26 DIAGNOSIS — I10 Essential (primary) hypertension: Secondary | ICD-10-CM

## 2019-01-26 NOTE — Progress Notes (Signed)
Virtual Visit via Video Note   This visit type was conducted due to national recommendations for restrictions regarding the COVID-19 Pandemic (e.g. social distancing) in an effort to limit this patient's exposure and mitigate transmission in our community.  Due to his co-morbid illnesses, this patient is at least at moderate risk for complications without adequate follow up.  This format is felt to be most appropriate for this patient at this time.  All issues noted in this document were discussed and addressed.  A limited physical exam was performed with this format.  Please refer to the patient's chart for his consent to telehealth for [facility].   Date:  01/26/2019   ID:  Collin [name], DOB 1940-11-12, MRN [id number]  Patient Location: Home Provider Location: Home  PCP:  [name], MD  Cardiologist:  Dietrich Pates, MD  Electrophysiologist:  None   Evaluation Performed:  Follow-Up Visit  Chief Complaint:  F/U of CHF    History of Present Illness:    Collin [name] is a 78 y.o. male with a history of NICM  S/p ICD, Last echo in 2018 LVEF improved to 55 to 60%.   Pt also has a history of atiral fib ( on coumadin), HL, DM, OSA and tongue a   He was seen by Irving Shows in cardiology in Dec 2019   At thte time he had cough, aches, chills    The patient is staying home now   Doing some work in garden    He denies CP   Says his breathing is OK   No PND   No orthopnea. Denies dizziness   Does not take BP regularly     The patient does not:[id number]} have symptoms concerning for COVID-19 infection (fever, chills, cough, or new shortness of breath).    Past Medical History:  Diagnosis Date  . AICD (automatic cardioverter/defibrillator) present   . Atrial fibrillation (HCC)   . Cancer (HCC)    tongue cancer  . Cardiomyopathy, nonischemic (HCC) 1998  . CHF (congestive heart failure) (HCC)   . Claustrophobia   . Diabetes mellitus   . Dysrhythmia   . Heart disease   .  History of echocardiogram    Echo 8/18:  EF 55-60, no RWMA, severe LAE, normal RVSF, mild RAE  . History of radiation therapy 12/07/16- 01/24/17   Base of Tongue 70 Gy in 35 fractions  . Hyperlipidemia   . Memory loss   . OSA (obstructive sleep apnea)   . Shingles   . Umbilical hernia   . 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 It sometimes shows as a shadow on scans.    Past Surgical History:  Procedure Laterality Date  . CARDIAC DEFIBRILLATOR PLACEMENT  2006  . CATARACT EXTRACTION Bilateral    01/07/2016. 01/22/2016  . CYST EXCISION     multiple drainage to a cyst in his neck.   Marland Kitchen DIRECT LARYNGOSCOPY N/A 11/16/2016   Procedure: DIRECT LARYNGOSCOPY AND BIOPSY;  Surgeon: [name], MD;  Location: [facility] OR;  Service: ENT;  Laterality: N/A;  . EYE SURGERY    . HERNIA REPAIR  1950s  . IMPLANTABLE CARDIOVERTER DEFIBRILLATOR (ICD) GENERATOR CHANGE N/A 05/10/2014   Procedure: ICD GENERATOR CHANGE;  Surgeon: [name], MD;  Location: [facility] CATH LAB;  Service: Cardiovascular;  Laterality: N/A;  . INSERTION OF MESH N/A 01/18/2018   Procedure: INSERTION OF MESH;  Surgeon: [name], MD;  Location: [facility] OR;  Service: General;  Laterality: N/A;  . IR GASTROSTOMY TUBE MOD SED  12/27/2016  . IR GASTROSTOMY TUBE REMOVAL  03/24/2018  . IR PATIENT EVAL TECH 0-60 MINS  01/17/2017  . MASS EXCISION Right 11/16/2016   Procedure: EXCISION RIGHT NECK LIPOMA;  Surgeon: [name], MD;  Location: [facility] OR;  Service: ENT;  Laterality: Right;  . MOHS SURGERY  01/01/2015   to top of head  . MOUTH SURGERY     teeth removed 4 teeth extracted 02/2010  . RADICAL NECK DISSECTION Right 07/25/2017   Procedure: RIGHT NECK DISSECTION;  Surgeon: [name], MD;  Location: [facility] OR;  Service: ENT;  Laterality: Right;  . UMBILICAL HERNIA REPAIR  01/18/2018   w/mesh  . UMBILICAL HERNIA REPAIR N/A 01/18/2018   Procedure: UMBILICAL HERNIA REPAIR WITH  MESH;  Surgeon: [name], MD;  Location: [facility] OR;  Service: General;  Laterality: N/A;     Current Meds  Medication Sig  . acetaminophen (TYLENOL) 500 MG tablet Take 1,000 mg by mouth daily as needed for moderate pain.   . carvedilol (COREG) 25 MG tablet TAKE ONE TABLET BY MOUTH TWICE A DAY   NEEDS APPOINTMENT  . diltiazem (CARDIZEM) 90 MG tablet TAKE ONE TABLET BY MOUTH DAILY  . donepezil (ARICEPT) 10 MG tablet Take 1/2 tablet daily for 2 weeks, then increase to 1 tablet daily  . escitalopram (LEXAPRO) 20 MG tablet Take 1 tablet (20 mg total) by mouth daily.  . polyethylene glycol (MIRALAX) packet Take 17 g by mouth daily as needed. Available OTC  . potassium chloride (K-DUR,KLOR-CON) 10 MEQ tablet DISSOLVE  1 TABLET IN WATER AND ADMINISTER VIA PEG TUDE DAILY  . rosuvastatin (CRESTOR) 10 MG tablet TAKE ONE TABLET BY MOUTH DAILY  . sodium fluoride (DENTA 5000 PLUS) 1.1 % CREA dental cream Apply gel to tooth brush. Brush teeth for 2 minutes. Spit out excess-DO NOT swallow. Repeat nightly.  . torsemide (DEMADEX) 20 MG tablet Take 1 tablet (20 mg total) by mouth daily.  Marland Kitchen warfarin (COUMADIN) 5 MG tablet Take 1-1.5 tablets (5-7.5 mg total) by mouth See admin instructions. TAKE AS DIRECTED BY ANTICOAGULATION CLINIC. Take 5 mg daily except Wednesday and Saturday take 7.5 mg normally  **For the next 3 days take 7.5 mg Daily and go back to regular schedule 3/18. Follow up with Coumadin Clinic Monday for INR Check (Patient taking differently: Take 5-7.5 mg by mouth See admin instructions. Take 5mg  once daily at night except on Wednesdays take 7.5 mg at night)     Allergies:   Niaspan [niacin er]   Social History   Tobacco Use  . Smoking status: Never Smoker  . Smokeless tobacco: Never Used  Substance Use Topics  . Alcohol use: Yes    Comment: "a little wine every once in a while" 01/17/2018  . Drug use: No     Family Hx: The patient's family history includes Heart disease in his mother;  Hypertension in his father; Lupus in his mother; Stroke in his father; Sudden death in his mother.  ROS:   Please see the history of present illness.    All other systems reviewed and are negative.   Prior CV studies:   The following studies were reviewed today:    Labs/Other Tests and Data Reviewed:    EKG:  No ECG reviewed.  Recent Labs: 01/31/2018: TSH 0.79 03/03/2018: BUN 18; Creatinine, Ser 0.69; Potassium 4.5;  Sodium 142 08/08/2018: Hemoglobin 14.9; Platelets 249   Recent Lipid Panel Lab Results  Component Value Date/Time   CHOL 175 11/10/2017 12:01 PM   TRIG 105 11/10/2017 12:01 PM   HDL 65 11/10/2017 12:01 PM   CHOLHDL 2.7 11/10/2017 12:01 PM   CHOLHDL 5 11/13/2015 03:25 PM   LDLCALC 89 11/10/2017 12:01 PM   LDLDIRECT 72.0 11/13/2015 03:25 PM    Wt Readings from Last 3 Encounters:  01/26/19 200 lb (90.7 kg)  09/22/18 214 lb (97.1 kg)  08/10/18 205 lb 2 oz (93 kg)     Objective:    Vital Signs:  BP (!) 95/55   Pulse 63   Ht 6' (1.829 m)   Wt 200 lb (90.7 kg)   BMI 27.12 kg/m    VITAL SIGNS:  reviewed  ASSESSMENT & PLAN:    1. NICM   Pt denies symptoms of SOB    He has ICD LVEF normalized  On last echo    2.  Hx Afib  Rate control   On coumadin    3  Hx renal insuff   Will get BMET  4  HL  Get lipids  5  HTN   Pt denies dizziness with current BP   Follow with more measurements in both arms   No changes     COVID-19 Education: The signs and symptoms of COVID-19 were discussed with the patient and how to seek care for testing (follow up with PCP or arrange E-visit).  The importance of social distancing was discussed today.  Time:   Today, I have spent 25  minutes with the patient with telehealth technology discussing the above problems.     Medication Adjustments/Labs and Tests Ordered: Current medicines are reviewed at length with the patient today.  Concerns regarding medicines are outlined above.   Tests Ordered: No orders of the defined  types were placed in this encounter.   Medication Changes: No orders of the defined types were placed in this encounter.   Disposition:  Follow up October 2020  SignedDietrich Pates, MD  01/26/2019 2:01 PM    [facility]

## 2019-01-26 NOTE — Addendum Note (Signed)
Addended by: Rodman Key on: 01/26/2019 03:29 PM   Modules accepted: Orders

## 2019-01-26 NOTE — Patient Instructions (Signed)
Medication Instructions:  No changes If you need a refill on your cardiac medications before your next appointment, please call your pharmacy.   Lab work: Psychologist, occupational, bmet, bnp, tsh --please schedule a day, at your convenience, to come into office for this blood work.  If you have labs (blood work) drawn today and your tests are completely normal, you will receive your results only by: Marland Kitchen MyChart Message (if you have MyChart) OR . A paper copy in the mail If you have any lab test that is abnormal or we need to change your treatment, we will call you to review the results.  Testing/Procedures: None   Follow-Up: At [facility], you and your health needs are our priority.  As part of our continuing mission to provide you with exceptional heart care, we have created designated Provider Care Teams.  These Care Teams include your primary Cardiologist (physician) and Advanced Practice Providers (APPs -  Physician Assistants and Nurse Practitioners) who all work together to provide you with the care you need, when you need it. You will need a follow up appointment in:  5 months.  Please call our office 2 months in advance to schedule this appointment.  You may see Dorris Carnes, MD or one of the following Advanced Practice Providers on your designated Care Team: [name], PA-C [place], [place] . Daune Perch, NP  Any Other Special Instructions Will Be Listed Below (If Applicable).

## 2019-01-31 ENCOUNTER — Encounter (INDEPENDENT_AMBULATORY_CARE_PROVIDER_SITE_OTHER): Payer: Medicare HMO | Admitting: Ophthalmology

## 2019-01-31 ENCOUNTER — Other Ambulatory Visit: Payer: Self-pay

## 2019-01-31 DIAGNOSIS — H35373 Puckering of macula, bilateral: Secondary | ICD-10-CM

## 2019-01-31 DIAGNOSIS — H35033 Hypertensive retinopathy, bilateral: Secondary | ICD-10-CM

## 2019-01-31 DIAGNOSIS — H43813 Vitreous degeneration, bilateral: Secondary | ICD-10-CM

## 2019-01-31 DIAGNOSIS — I10 Essential (primary) hypertension: Secondary | ICD-10-CM

## 2019-01-31 DIAGNOSIS — E113293 Type 2 diabetes mellitus with mild nonproliferative diabetic retinopathy without macular edema, bilateral: Secondary | ICD-10-CM | POA: Diagnosis not present

## 2019-01-31 DIAGNOSIS — E11319 Type 2 diabetes mellitus with unspecified diabetic retinopathy without macular edema: Secondary | ICD-10-CM

## 2019-02-19 ENCOUNTER — Other Ambulatory Visit: Payer: Self-pay

## 2019-02-19 ENCOUNTER — Ambulatory Visit (INDEPENDENT_AMBULATORY_CARE_PROVIDER_SITE_OTHER): Payer: Medicare HMO | Admitting: General Practice

## 2019-02-19 DIAGNOSIS — I480 Paroxysmal atrial fibrillation: Secondary | ICD-10-CM

## 2019-02-19 DIAGNOSIS — Z7901 Long term (current) use of anticoagulants: Secondary | ICD-10-CM | POA: Diagnosis not present

## 2019-02-19 LAB — POCT INR: INR: 1.5 — AB (ref 2.0–3.0)

## 2019-02-19 NOTE — Patient Instructions (Incomplete)
Pre visit review using our clinic review tool, if applicable. No additional management support is needed unless otherwise documented below in the visit note.  Please take 1 1/2 tablets today and tomorrow and then change dosage and take 1 tablet daily.  Re-check in 2 weeks.

## 2019-03-05 ENCOUNTER — Ambulatory Visit (INDEPENDENT_AMBULATORY_CARE_PROVIDER_SITE_OTHER): Payer: Medicare HMO | Admitting: General Practice

## 2019-03-05 ENCOUNTER — Other Ambulatory Visit: Payer: Self-pay

## 2019-03-05 DIAGNOSIS — Z7901 Long term (current) use of anticoagulants: Secondary | ICD-10-CM | POA: Diagnosis not present

## 2019-03-05 DIAGNOSIS — I480 Paroxysmal atrial fibrillation: Secondary | ICD-10-CM

## 2019-03-05 LAB — POCT INR: INR: 1.4 — AB (ref 2.0–3.0)

## 2019-03-05 NOTE — Patient Instructions (Signed)
Pre visit review using our clinic review tool, if applicable. No additional management support is needed unless otherwise documented below in the visit note.  Please take 1 1/2 tablets today, tomorrow and Wednesday.  On Thursday continue to take 1 tablet daily.  Re-check in 1 week.

## 2019-03-12 ENCOUNTER — Ambulatory Visit: Payer: Medicare HMO

## 2019-03-12 DIAGNOSIS — H35373 Puckering of macula, bilateral: Secondary | ICD-10-CM | POA: Diagnosis not present

## 2019-03-12 DIAGNOSIS — H40013 Open angle with borderline findings, low risk, bilateral: Secondary | ICD-10-CM | POA: Diagnosis not present

## 2019-03-12 DIAGNOSIS — E119 Type 2 diabetes mellitus without complications: Secondary | ICD-10-CM | POA: Diagnosis not present

## 2019-03-12 DIAGNOSIS — Z961 Presence of intraocular lens: Secondary | ICD-10-CM | POA: Diagnosis not present

## 2019-03-12 LAB — HM DIABETES EYE EXAM

## 2019-03-14 ENCOUNTER — Ambulatory Visit (INDEPENDENT_AMBULATORY_CARE_PROVIDER_SITE_OTHER): Payer: Medicare HMO | Admitting: General Practice

## 2019-03-14 ENCOUNTER — Other Ambulatory Visit: Payer: Self-pay

## 2019-03-14 DIAGNOSIS — I480 Paroxysmal atrial fibrillation: Secondary | ICD-10-CM

## 2019-03-14 DIAGNOSIS — Z7901 Long term (current) use of anticoagulants: Secondary | ICD-10-CM | POA: Diagnosis not present

## 2019-03-14 LAB — POCT INR: INR: 2.3 (ref 2.0–3.0)

## 2019-03-14 NOTE — Patient Instructions (Addendum)
Pre visit review using our clinic review tool, if applicable. No additional management support is needed unless otherwise documented below in the visit note.  Continue to take 1 tablet daily.  Re-check in 4 weeks.

## 2019-03-19 ENCOUNTER — Ambulatory Visit (INDEPENDENT_AMBULATORY_CARE_PROVIDER_SITE_OTHER): Payer: Medicare HMO | Admitting: *Deleted

## 2019-03-19 DIAGNOSIS — I509 Heart failure, unspecified: Secondary | ICD-10-CM

## 2019-03-19 DIAGNOSIS — I428 Other cardiomyopathies: Secondary | ICD-10-CM | POA: Diagnosis not present

## 2019-03-20 ENCOUNTER — Telehealth: Payer: Self-pay

## 2019-03-20 LAB — CUP PACEART REMOTE DEVICE CHECK
Battery Remaining Longevity: 144 mo
Battery Remaining Percentage: 100 %
Brady Statistic RV Percent Paced: 17 %
Date Time Interrogation Session: 20200714112407
HighPow Impedance: 37 Ohm
Implantable Lead Implant Date: 20060209
Implantable Lead Location: 753860
Implantable Lead Model: 158
Implantable Lead Serial Number: 156422
Implantable Pulse Generator Implant Date: 20150904
Lead Channel Impedance Value: 355 Ohm
Lead Channel Pacing Threshold Amplitude: 1 V
Lead Channel Pacing Threshold Pulse Width: 0.4 ms
Lead Channel Setting Pacing Amplitude: 2.4 V
Lead Channel Setting Pacing Pulse Width: 0.4 ms
Lead Channel Setting Sensing Sensitivity: 0.5 mV
Pulse Gen Serial Number: 191056

## 2019-03-20 NOTE — Telephone Encounter (Signed)
Spoke with patient to remind of missed remote transmission 

## 2019-03-29 NOTE — Progress Notes (Signed)
Remote ICD transmission.   

## 2019-04-04 ENCOUNTER — Encounter: Payer: Self-pay | Admitting: Neurology

## 2019-04-04 ENCOUNTER — Ambulatory Visit (INDEPENDENT_AMBULATORY_CARE_PROVIDER_SITE_OTHER): Payer: Medicare HMO | Admitting: Neurology

## 2019-04-04 ENCOUNTER — Other Ambulatory Visit: Payer: Self-pay

## 2019-04-04 VITALS — BP 104/63 | HR 83 | Ht 73.0 in | Wt 203.0 lb

## 2019-04-04 DIAGNOSIS — F0390 Unspecified dementia without behavioral disturbance: Secondary | ICD-10-CM

## 2019-04-04 DIAGNOSIS — F03A0 Unspecified dementia, mild, without behavioral disturbance, psychotic disturbance, mood disturbance, and anxiety: Secondary | ICD-10-CM

## 2019-04-04 DIAGNOSIS — R69 Illness, unspecified: Secondary | ICD-10-CM | POA: Diagnosis not present

## 2019-04-04 MED ORDER — ESCITALOPRAM OXALATE 20 MG PO TABS: 20.0000 mg | ORAL_TABLET | Freq: Every day | ORAL | 3 refills | Status: DC

## 2019-04-04 MED ORDER — DONEPEZIL HCL 10 MG PO TABS: ORAL_TABLET | ORAL | 3 refills | Status: DC

## 2019-04-04 NOTE — Progress Notes (Signed)
NEUROLOGY FOLLOW UP OFFICE NOTE  RAJDEEP ABBS [id number] 04/08/1941  HISTORY OF PRESENT ILLNESS: I had the pleasure of seeing Collin [name] in follow-up in the neurology clinic on 04/04/2019.  The patient was last seen 6 months ago for worsening memory. He is again accompanied by his wife who helps supplement the history today.  MMSE 20/30 in January 2020 (25/30 in May 2019). He was started on Donepezil 10mg  daily on last visit, which he is tolerating without side effects. When asked about memory, he states "depends on what I'm trying to remember." He misplaces things frequently. He denies getting lost driving, his wife has to give directions. He does not travel a lot. His wife reports memory is slowly getting worse. She has no issues with his driving at this time. She fixes his pills but he would forget them even if she reminds him. He denies any falls, his wife reminds him he tripped on some vines while working in the yard. She has noticed the Lexapro 20mg  daily has helped with energy, he works out in the yard, but he is still a "grumpy old guy" at home. No paranoia or hallucinations. He is independent with dressing and bathing. His wife has noticed he has lost some weight. He denies any headaches, dizziness, vision changes, focal numbness/tingling/weakness.  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 He started noticing changes around a year ago, soon after diagnosis of cancer. He describes his memory as "terrible," he would go upstairs and stop in the hallway forgetting what he went for. He could not find his eyeglasses and got very frustrated, berating himself for 40 minutes, calling himself stupid. His wife reports minor  memory issues for a few years, but more noticeable in the past year. He used to manage his own medications, but at that time she would find full bottles of medications that should have been empty. She took over medications and now fixes his pills weekly. He states he does not drive as much and denies getting lost driving, his wife corrects him and reminds him he stopped driving November 2017 when he was having episodes with his ICD and was told not to drive. She feels he would have gotten lost, she always gives him instructions driving and he would panic if he did not know where he was. The other day at home he could not find their drinking glasses and was opening all the cabinets in the kitchen. He repeats stories and questions. He is independent with dressing and bathing, but his wife is never sure if he brushes his teeth, he gets mad if she reminds him. She states "he is a good actor." She reports he is friendly to everyone, talking to strangers, but once they get into the car, "he is a grump, angry old man," taking it out on her all the time. She reports since the cancer diagnosis, things have been a lot worse, she thinks he is depressed. He states he is frustrated because he cannot do a lot of things he used to do.  He gets dizzy a lot when standing and has to get his bearings first. He has missteps and falls, he fell yesterday and had abrasions on his left arm. His vision is blurred. He has had  swallowing difficulties since the surgery and radiation. He has a feeding tube but mostly is able to swallow by mouth now. No neck pain, some back discomfort. He gets left arm numbness when sitting for a long time. He has swelling on his left ankle, his wife reports it is "collapsed" for years, but he has never been evaluated for it. He has constipation, no incontinence, no anosmia or tremors. His father had memory issues. No history of significant head injuries or alcohol use. He finished up to the 9th grade.   Diagnostic Data:  I personally reviewed head CT with and without contrast done July 2019 which did not show any acute changes, there was mild diffuse atrophy. TSH and B12 normal.  PAST MEDICAL HISTORY: Past Medical History:  Diagnosis Date  . AICD (automatic cardioverter/defibrillator) present   . Atrial fibrillation (HCC)   . Cancer (HCC)    tongue cancer  . Cardiomyopathy, nonischemic (HCC) 1998  . CHF (congestive heart failure) (HCC)   . Claustrophobia   . Diabetes mellitus   . Dysrhythmia   . Heart disease   . History of echocardiogram    Echo 8/18:  EF 55-60, no RWMA, severe LAE, normal RVSF, mild RAE  . History of radiation therapy 12/07/16- 01/24/17   Base of Tongue 70 Gy in 35 fractions  . Hyperlipidemia   . Memory loss   . OSA (obstructive sleep apnea)   . Shingles   . Umbilical hernia   . 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 It sometimes shows as a shadow on scans.     MEDICATIONS: Current Outpatient Medications on File Prior to Visit  Medication Sig Dispense Refill  . acetaminophen (TYLENOL) 500 MG tablet Take 1,000 mg by mouth daily as needed for moderate pain.     . carvedilol (COREG) 25 MG tablet TAKE ONE TABLET BY MOUTH TWICE A DAY   NEEDS APPOINTMENT 180 tablet 3  . diltiazem (CARDIZEM) 90 MG tablet TAKE ONE TABLET BY MOUTH DAILY 90 tablet 2  . donepezil (ARICEPT) 10 MG tablet Take 1/2 tablet daily for 2 weeks, then increase to 1 tablet daily 30 tablet 11  . escitalopram (LEXAPRO) 20 MG tablet Take 1 tablet (20 mg total) by mouth daily. 30 tablet 11  . polyethylene glycol (MIRALAX) packet Take 17 g by mouth daily as needed. Available OTC 30 each 0  . potassium chloride (K-DUR,KLOR-CON) 10 MEQ tablet DISSOLVE  1 TABLET IN WATER AND ADMINISTER VIA PEG TUDE DAILY 90 tablet 3  . rosuvastatin (CRESTOR) 10 MG tablet TAKE ONE TABLET BY MOUTH DAILY 90 tablet 0  . sodium fluoride (DENTA 5000 PLUS) 1.1 % CREA dental  cream Apply gel to tooth brush. Brush teeth for 2 minutes. Spit out excess-DO NOT swallow. Repeat nightly. 1 Tube 0  . torsemide (DEMADEX) 20 MG tablet Take 1 tablet (20 mg total) by mouth daily. 90 tablet 3  . warfarin (COUMADIN) 5 MG tablet Take 1-1.5 tablets (5-7.5 mg total) by mouth See admin instructions. TAKE AS DIRECTED BY ANTICOAGULATION CLINIC. Take 5 mg daily except Wednesday and Saturday take 7.5 mg normally  **For the next 3 days take 7.5 mg Daily and go back to regular schedule 3/18. Follow up with Coumadin Clinic Monday for INR Check (Patient taking differently: Take 5-7.5 mg by mouth See admin instructions. Take 5mg  once daily at night except on Wednesdays take 7.5 mg at night)    .  warfarin (COUMADIN) 5 MG tablet TAKE 1 TABLET BY MOUTH DAILY OR AS DIRECTED BY ANTICOAGULATION CLINIC 100 tablet 1   No current facility-administered medications on file prior to visit.     ALLERGIES: Allergies  Allergen Reactions  . Niaspan [Niacin Er] Other (See Comments)    FLUSHED    FAMILY HISTORY: Family History  Problem Relation Age of Onset  . Heart disease Mother   . Sudden death Mother        under age 42  . Lupus Mother   . Hypertension Father   . Stroke Father     SOCIAL HISTORY: Social History   Socioeconomic History  . Marital status: Married    Spouse name: Collin [name]  . Number of children: 3  . Years of education: Not on file  . Highest education level: Not on file  Occupational History  . Occupation: Retired Engineer, materials  Social Needs  . Financial resource strain: Not on file  . Food insecurity    Worry: Not on file    Inability: Not on file  . Transportation needs    Medical: Not on file    Non-medical: Not on file  Tobacco Use  . Smoking status: Never Smoker  . Smokeless tobacco: Never Used  Substance and Sexual Activity  . Alcohol use: Yes    Comment: "a little wine every once in a while" 01/17/2018  . Drug use: No  . Sexual activity: Not on file   Lifestyle  . Physical activity    Days per week: Not on file    Minutes per session: Not on file  . Stress: Not on file  Relationships  . Social Musician on phone: Not on file    Gets together: Not on file    Attends religious service: Not on file    Active member of club or organization: Not on file    Attends meetings of clubs or organizations: Not on file    Relationship status: Not on file  . Intimate partner violence    Fear of current or ex partner: Not on file    Emotionally abused: Not on file    Physically abused: Not on file    Forced sexual activity: Not on file  Other Topics Concern  . Not on file  Social History Narrative   Pt lives in split level home with his wife, Collin [name].   Has 3 sons   9th grade education   Retired Office manager guard   Right handed    REVIEW OF SYSTEMS: Constitutional: No fevers, chills, or sweats, no generalized fatigue, change in appetite Eyes: No visual changes, double vision, eye pain Ear, nose and throat: No hearing loss, ear pain, nasal congestion, sore throat Cardiovascular: No chest pain, palpitations Respiratory:  No shortness of breath at rest or with exertion, wheezes GastrointestinaI: No nausea, vomiting, diarrhea, abdominal pain, fecal incontinence Genitourinary:  No dysuria, urinary retention or frequency Musculoskeletal:  No neck pain, back pain Integumentary: No rash, pruritus, skin lesions Neurological: as above Psychiatric: + depression, anxiety Endocrine: No palpitations, fatigue, diaphoresis, mood swings, change in appetite, change in weight, increased thirst Hematologic/Lymphatic:  No anemia, purpura, petechiae. Allergic/Immunologic: no itchy/runny eyes, nasal congestion, recent allergic reactions, rashes  PHYSICAL EXAM: Vitals:   04/04/19 1610  BP: 104/63  Pulse: 83  SpO2: 97%   General: No acute distress Head:  Normocephalic/atraumatic Skin/Extremities: No rash, no edema Neurological Exam: alert  and oriented to person, city. No aphasia or dysarthria.  Fund of knowledge is appropriate.  Recent and remote memory are intact.  Attention and concentration are reduced.    Able to name objects and repeat phrases. He had more difficulty with executive function testing.  Montreal Cognitive Assessment  04/04/2019  Visuospatial/ Executive (0/5) 1  Naming (0/3) 3  Attention: Read list of digits (0/2) 2  Attention: Read list of letters (0/1) 1  Attention: Serial 7 subtraction starting at 100 (0/3) 0  Language: Repeat phrase (0/2) 1  Language : Fluency (0/1) 0  Abstraction (0/2) 1  Delayed Recall (0/5) 4  Orientation (0/6) 1  Total 14  Adjusted Score (based on education) 15    MMSE - Mini Mental State Exam 09/22/2018 01/31/2018  Orientation to time 2 3  Orientation to Place 4 5  Registration 3 3  Attention/ Calculation 0 5  Recall 2 1  Language- name 2 objects 2 2  Language- repeat 1 1  Language- follow 3 step command 3 2  Language- read & follow direction 1 1  Write a sentence 1 1  Copy design 1 1  Total score 20 25    Cranial nerves: Pupils equal, round, reactive to light. Extraocular movements intact with no nystagmus. Visual fields full. Facial sensation intact. No facial asymmetry. Tongue, uvula, palate midline.  Motor: Bulk and tone normal, muscle strength 5/5 throughout with no pronator drift.  Finger to nose testing intact.  Gait slow and cautious with cane, no ataxia.  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 MOCA score today 15/30, he had more difficulty with executive functioning. 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 Head CT no acute changes with mild diffuse atrophy. Continue Donepezil 10mg  daily, Lexapro 20mg  daily. We may add on  Memantine on next visit. Continue to monitor driving. His wife was instructed to start administering medications. We again discussed the importance of physical exercise, brain stimulation exercises, and control of vascular risk factors for brain health. He will follow-up in 6 months and knows to call for any changes.  Thank you for allowing me to participate in his care.  Please do not hesitate to call for any questions or concerns.  The duration of this appointment visit was 30 minutes of face-to-face time with the patient.  Greater than 50% of this time was spent in counseling, explanation of diagnosis, planning of further management, and coordination of care.   Patrcia Dolly, M.D.   CC: Dr. Caryl Never

## 2019-04-04 NOTE — Patient Instructions (Signed)
1. Continue all your medications  2. Recommend having Collin [name] give your medications daily  3. Continue to monitor driving  4. Follow-up in 6 months, call for any changes  FALL PRECAUTIONS: Be cautious when walking. Scan the area for obstacles that may increase the risk of trips and falls. When getting up in the mornings, sit up at the edge of the bed for a few minutes before getting out of bed. Consider elevating the bed at the head end to avoid drop of blood pressure when getting up. Walk always in a well-lit room (use night lights in the walls). Avoid area rugs or power cords from appliances in the middle of the walkways. Use a walker or a cane if necessary and consider physical therapy for balance exercise. Get your eyesight checked regularly.  FINANCIAL OVERSIGHT: Supervision, especially oversight when making financial decisions or transactions is also recommended.  HOME SAFETY: Consider the safety of the kitchen when operating appliances like stoves, microwave oven, and blender. Consider having supervision and share cooking responsibilities until no longer able to participate in those. Accidents with firearms and other hazards in the house should be identified and addressed as well.  DRIVING: Regarding driving, in patients with progressive memory problems, driving will be impaired. We advise to have someone else do the driving if trouble finding directions or if minor accidents are reported. Independent driving assessment is available to determine safety of driving.  ABILITY TO BE LEFT ALONE: If patient is unable to contact 911 operator, consider using LifeLine, or when the need is there, arrange for someone to stay with patients. Smoking is a fire hazard, consider supervision or cessation. Risk of wandering should be assessed by caregiver and if detected at any point, supervision and safe proof recommendations should be instituted.  MEDICATION SUPERVISION: Inability to self-administer  medication needs to be constantly addressed. Implement a mechanism to ensure safe administration of the medications.  RECOMMENDATIONS FOR ALL PATIENTS WITH MEMORY PROBLEMS: 1. Continue to exercise (Recommend 30 minutes of walking everyday, or 3 hours every week) 2. Increase social interactions - continue going to [place] and enjoy social gatherings with friends and family 3. Eat healthy, avoid fried foods and eat more fruits and vegetables 4. Maintain adequate blood pressure, blood sugar, and blood cholesterol level. Reducing the risk of stroke and cardiovascular disease also helps promoting better memory. 5. Avoid stressful situations. Live a simple life and avoid aggravations. Organize your time and prepare for the next day in anticipation. 6. Sleep well, avoid any interruptions of sleep and avoid any distractions in the bedroom that may interfere with adequate sleep quality 7. Avoid sugar, avoid sweets as there is a strong link between excessive sugar intake, diabetes, and cognitive impairment The Mediterranean diet has been shown to help patients reduce the risk of progressive memory disorders and reduces cardiovascular risk. This includes eating fish, eat fruits and green leafy vegetables, nuts like almonds and hazelnuts, walnuts, and also use olive oil. Avoid fast foods and fried foods as much as possible. Avoid sweets and sugar as sugar use has been linked to worsening of memory function.  There is always a concern of gradual progression of memory problems. If this is the case, then we may need to adjust level of care according to patient needs. Support, both to the patient and caregiver, should then be put into place.

## 2019-04-11 ENCOUNTER — Ambulatory Visit (INDEPENDENT_AMBULATORY_CARE_PROVIDER_SITE_OTHER): Payer: Medicare HMO | Admitting: General Practice

## 2019-04-11 ENCOUNTER — Other Ambulatory Visit: Payer: Self-pay

## 2019-04-11 DIAGNOSIS — Z7901 Long term (current) use of anticoagulants: Secondary | ICD-10-CM

## 2019-04-11 DIAGNOSIS — I480 Paroxysmal atrial fibrillation: Secondary | ICD-10-CM

## 2019-04-11 LAB — POCT INR: INR: 1.7 — AB (ref 2.0–3.0)

## 2019-04-11 NOTE — Patient Instructions (Signed)
Pre visit review using our clinic review tool, if applicable. No additional management support is needed unless otherwise documented below in the visit note.  Take 1 1/2 tablet today and then continue to take 1 tablet daily.  Re-check in 4 weeks.

## 2019-04-23 ENCOUNTER — Other Ambulatory Visit: Payer: Self-pay | Admitting: Internal Medicine

## 2019-04-30 ENCOUNTER — Other Ambulatory Visit: Payer: Self-pay | Admitting: Internal Medicine

## 2019-05-09 ENCOUNTER — Ambulatory Visit (INDEPENDENT_AMBULATORY_CARE_PROVIDER_SITE_OTHER): Payer: Medicare HMO | Admitting: General Practice

## 2019-05-09 ENCOUNTER — Other Ambulatory Visit: Payer: Self-pay

## 2019-05-09 DIAGNOSIS — Z7901 Long term (current) use of anticoagulants: Secondary | ICD-10-CM

## 2019-05-09 DIAGNOSIS — I480 Paroxysmal atrial fibrillation: Secondary | ICD-10-CM

## 2019-05-09 LAB — POCT INR: INR: 1.8 — AB (ref 2.0–3.0)

## 2019-05-09 NOTE — Patient Instructions (Signed)
Pre visit review using our clinic review tool, if applicable. No additional management support is needed unless otherwise documented below in the visit note.  Take 1 1/2 tablet today and then change dosage and take 1 tablet daily except 1 1/2 tablets every Wednesday.   Re-check in 4 weeks.

## 2019-05-26 DIAGNOSIS — S2242XA Multiple fractures of ribs, left side, initial encounter for closed fracture: Secondary | ICD-10-CM | POA: Diagnosis not present

## 2019-05-26 DIAGNOSIS — M25512 Pain in left shoulder: Secondary | ICD-10-CM | POA: Diagnosis not present

## 2019-05-26 DIAGNOSIS — R0781 Pleurodynia: Secondary | ICD-10-CM | POA: Diagnosis not present

## 2019-05-30 ENCOUNTER — Ambulatory Visit (INDEPENDENT_AMBULATORY_CARE_PROVIDER_SITE_OTHER): Payer: Medicare HMO

## 2019-05-30 ENCOUNTER — Encounter: Payer: Self-pay | Admitting: Family Medicine

## 2019-05-30 ENCOUNTER — Other Ambulatory Visit: Payer: Medicare HMO

## 2019-05-30 ENCOUNTER — Other Ambulatory Visit: Payer: Self-pay

## 2019-05-30 ENCOUNTER — Ambulatory Visit (INDEPENDENT_AMBULATORY_CARE_PROVIDER_SITE_OTHER): Payer: Medicare HMO | Admitting: Family Medicine

## 2019-05-30 VITALS — BP 112/62 | HR 58 | Temp 97.9°F | Ht 73.0 in | Wt 213.8 lb

## 2019-05-30 DIAGNOSIS — S2242XA Multiple fractures of ribs, left side, initial encounter for closed fracture: Secondary | ICD-10-CM

## 2019-05-30 DIAGNOSIS — M79642 Pain in left hand: Secondary | ICD-10-CM | POA: Diagnosis not present

## 2019-05-30 DIAGNOSIS — Z23 Encounter for immunization: Secondary | ICD-10-CM

## 2019-05-30 DIAGNOSIS — S6992XA Unspecified injury of left wrist, hand and finger(s), initial encounter: Secondary | ICD-10-CM | POA: Diagnosis not present

## 2019-05-30 MED ORDER — HYDROCODONE-ACETAMINOPHEN 5-325 MG PO TABS: 1.0000 | ORAL_TABLET | Freq: Four times a day (QID) | ORAL | 0 refills | Status: DC | PRN

## 2019-05-30 NOTE — Progress Notes (Signed)
Subjective:     Patient ID: Collin [name], male   DOB: 17-Sep-1940, 78 y.o.   MRN: [id number]  HPI Patient is seen following a fall on Saturday.  Collin [name] was playing with grandchildren and went to kick a ball and his foot caught apparently on the ground Collin [name] fell down landing on the left side.  Collin [name] thinks his left elbow hit against his rib cage area.  Collin [name] went to urgent care and had x-rays done and reportedly had nondisplaced fractures of the left sixth and seventh ribs.  Moderate pain.  Collin [name] was prescribed 20 hydrocodone which Collin [name] has been taking about every 6 hours.  His pain is fairly severe at times.  No fevers or chills.  Collin [name] denied any loss of consciousness.  Collin [name] did apparently strike his left temporal area but has not had any headaches since then.  No confusion.  Also complains of left hand pain since the fall.  This is over the navicular region.  No wrist pain.  Collin [name] has some mild swelling of the hand.  No significant elbow or shoulder pain  Past Medical History:  Diagnosis Date  . AICD (automatic cardioverter/defibrillator) present   . Atrial fibrillation (HCC)   . Cancer (HCC)    tongue cancer  . Cardiomyopathy, nonischemic (HCC) 1998  . CHF (congestive heart failure) (HCC)   . Claustrophobia   . Diabetes mellitus   . Dysrhythmia   . Heart disease   . History of echocardiogram    Echo 8/18:  EF 55-60, no RWMA, severe LAE, normal RVSF, mild RAE  . History of radiation therapy 12/07/16- 01/24/17   Base of Tongue 70 Gy in 35 fractions  . Hyperlipidemia   . Memory loss   . OSA (obstructive sleep apnea)   . Shingles   . Umbilical hernia   . 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 It sometimes shows as a shadow on scans.    Past Surgical History:  Procedure Laterality Date  . CARDIAC DEFIBRILLATOR PLACEMENT  2006  . CATARACT EXTRACTION Bilateral    01/07/2016. 01/22/2016  . CYST EXCISION     multiple drainage to a cyst in his neck.   Marland Kitchen  DIRECT LARYNGOSCOPY N/A 11/16/2016   Procedure: DIRECT LARYNGOSCOPY AND BIOPSY;  Surgeon: [name], MD;  Location: [facility] OR;  Service: ENT;  Laterality: N/A;  . EYE SURGERY    . HERNIA REPAIR  1950s  . IMPLANTABLE CARDIOVERTER DEFIBRILLATOR (ICD) GENERATOR CHANGE N/A 05/10/2014   Procedure: ICD GENERATOR CHANGE;  Surgeon: [name], MD;  Location: [facility] C[facility] LAB;  Service: Cardiovascular;  Laterality: N/A;  . INSERTION OF MESH N/A 01/18/2018   Procedure: INSERTION OF MESH;  Surgeon: [name], MD;  Location: [facility] OR;  Service: General;  Laterality: N/A;  . IR GASTROSTOMY TUBE MOD SED  12/27/2016  . IR GASTROSTOMY TUBE REMOVAL  03/24/2018  . IR PATIENT EVAL TECH 0-60 MINS  01/17/2017  . MASS EXCISION Right 11/16/2016   Procedure: EXCISION RIGHT NECK LIPOMA;  Surgeon: [name], MD;  Location: [facility] OR;  Service: ENT;  Laterality: Right;  . MOHS SURGERY  01/01/2015   to top of head  . MOUTH SURGERY     teeth removed 4 teeth extracted 02/2010  . RADICAL NECK DISSECTION Right 07/25/2017   Procedure: RIGHT NECK DISSECTION;  Surgeon: [name], MD;  Location: [facility] OR;  Service: ENT;  Laterality: Right;  . UMBILICAL HERNIA REPAIR  01/18/2018   w/mesh  . UMBILICAL HERNIA REPAIR N/A 01/18/2018   Procedure: UMBILICAL HERNIA REPAIR WITH MESH;  Surgeon: [name], MD;  Location: [facility] OR;  Service: General;  Laterality: N/A;    reports that Collin [name] has never smoked. Collin [name] has never used smokeless tobacco. Collin [name] reports current alcohol use. Collin [name] reports that Collin [name] does not use drugs. family history includes Heart disease in his mother; Hypertension in his father; Lupus in his mother; Stroke in his father; Sudden death in his mother. Allergies  Allergen Reactions  . Niaspan [Niacin Er] Other (See Comments)    FLUSHED     Review of Systems  Constitutional: Negative for fatigue.  Eyes: Negative for visual disturbance.  Respiratory: Negative for cough, chest tightness and shortness of  breath.   Cardiovascular: Negative for chest pain, palpitations and leg swelling.  Gastrointestinal: Negative for abdominal pain.  Neurological: Negative for dizziness, syncope, weakness, light-headedness and headaches.       Objective:   Physical Exam Constitutional:      Appearance: Collin [name] is well-developed.  HENT:     Right Ear: External ear normal.     Left Ear: External ear normal.  Eyes:     Pupils: Pupils are equal, round, and reactive to light.  Neck:     Musculoskeletal: Neck supple.     Thyroid: No thyromegaly.  Cardiovascular:     Rate and Rhythm: Normal rate and regular rhythm.  Pulmonary:     Effort: Pulmonary effort is normal. No respiratory distress.     Breath sounds: Normal breath sounds. No wheezing or rales.  Musculoskeletal:     Comments: Collin [name] has tenderness over the sixth through eighth rib region with little bit of visible ecchymosis in this region.  Collin [name] has full range of motion left wrist with no wrist tenderness.  Collin [name] does have tenderness localized over the navicular region.  Neurological:     Mental Status: Collin [name] is alert and oriented to person, place, and time.        Assessment:     Status post recent fall this past Saturday with reported nondisplaced fractures left sixth and seventh ribs.  Collin [name] also has some left hand pain.  This may be just contusion but need to rule out navicular fracture    Plan:     -1 refill of hydrocodone given for use sparingly as needed for severe pain.  Refilled #20  -Stressed importance of good deep breathing periodically  -Recommend x-rays left hand.  We stressed that even if x-rays negative recommend thumb spica splint and reassess in 2 weeks  [name] MD [facility] at [facility]

## 2019-05-30 NOTE — Addendum Note (Signed)
Addended by: Anibal Henderson on: 05/30/2019 01:25 PM   Modules accepted: Orders

## 2019-05-30 NOTE — Patient Instructions (Signed)
Rib Fracture  A rib fracture is a break or crack in one of the bones of the ribs. The ribs are long, curved bones that wrap around your chest and attach to your spine and your breastbone. The ribs protect your heart, lungs, and other organs in the chest. A broken or cracked rib is often painful but is not usually serious. Most rib fractures heal on their own over time. However, rib fractures can be more serious if multiple ribs are broken or if broken ribs move out of place and push against other structures or organs. What are the causes? This condition is caused by:  Repetitive movements with high force, such as pitching a baseball or having severe coughing spells.  A direct blow to the chest, such as a sports injury, a car accident, or a fall.  Cancer that has spread to the bones, which can weaken bones and cause them to break. What are the signs or symptoms? Symptoms of this condition include:  Pain when you breathe in or cough.  Pain when someone presses on the injured area.  Feeling short of breath. How is this diagnosed? This condition is diagnosed with a physical exam and medical history. Imaging tests may also be done, such as:  Chest X-ray.  CT scan.  MRI.  Bone scan.  Chest ultrasound. How is this treated? Treatment for this condition depends on the severity of the fracture. Most rib fractures usually heal on their own in 1-3 months. Sometimes healing takes longer if there is a cough that does not stop or if there are other activities that make the injury worse (aggravating factors). While you heal, you will be given medicines to control the pain. You will also be taught deep breathing exercises. Severe injuries may require hospitalization or surgery. Follow these instructions at home: Managing pain, stiffness, and swelling  If directed, apply ice to the injured area. ? Put ice in a plastic bag. ? Place a towel between your skin and the bag. ? Leave the ice on for  20 minutes, 2-3 times a day.  Take over-the-counter and prescription medicines only as told by your health care provider. Activity  Avoid a lot of activity and any activities or movements that cause pain. Be careful during activities and avoid bumping the injured rib.  Slowly increase your activity as told by your health care provider. General instructions  Do deep breathing exercises as told by your health care provider. This helps prevent pneumonia, which is a common complication of a broken rib. Your health care provider may instruct you to: ? Take deep breaths several times a day. ? Try to cough several times a day, holding a pillow against the injured area. ? Use a device called incentive spirometer to practice deep breathing several times a day.  Drink enough fluid to keep your urine pale yellow.  Do not wear a rib belt or binder. These restrict breathing, which can lead to pneumonia.  Keep all follow-up visits as told by your health care provider. This is important. Contact a health care provider if:  You have a fever. Get help right away if:  You have difficulty breathing or you are short of breath.  You develop a cough that does not stop, or you cough up thick or bloody sputum.  You have nausea, vomiting, or pain in your abdomen.  Your pain gets worse and medicine does not help. Summary  A rib fracture is a break or crack in one of  the bones of the ribs.  A broken or cracked rib is often painful but is not usually serious.  Most rib fractures heal on their own over time.  Treatment for this condition depends on the severity of the fracture.  Avoid a lot of activity and any activities or movements that cause pain. This information is not intended to replace advice given to you by your health care provider. Make sure you discuss any questions you have with your health care provider. Document Released: 08/23/2005 Document Revised: 08/05/2017 Document Reviewed:  11/22/2016 Elsevier Patient Education  [place] for Left hand X-ray at [facility]  Consider thumb spica splint- every if X-ray negative for 2 weeks.

## 2019-06-04 ENCOUNTER — Other Ambulatory Visit: Payer: Self-pay | Admitting: Family Medicine

## 2019-06-06 ENCOUNTER — Other Ambulatory Visit: Payer: Self-pay

## 2019-06-06 ENCOUNTER — Ambulatory Visit (INDEPENDENT_AMBULATORY_CARE_PROVIDER_SITE_OTHER): Payer: Medicare HMO | Admitting: General Practice

## 2019-06-06 DIAGNOSIS — Z7901 Long term (current) use of anticoagulants: Secondary | ICD-10-CM | POA: Diagnosis not present

## 2019-06-06 DIAGNOSIS — I480 Paroxysmal atrial fibrillation: Secondary | ICD-10-CM

## 2019-06-06 LAB — POCT INR: INR: 3.6 — AB (ref 2.0–3.0)

## 2019-06-06 NOTE — Patient Instructions (Signed)
Pre visit review using our clinic review tool, if applicable. No additional management support is needed unless otherwise documented below in the visit note.  Skip coumadin today and then change dosage and take 1 tablet daily.  Re-check in 3 to 4 weeks.

## 2019-06-19 ENCOUNTER — Ambulatory Visit (INDEPENDENT_AMBULATORY_CARE_PROVIDER_SITE_OTHER): Payer: Medicare HMO | Admitting: *Deleted

## 2019-06-19 DIAGNOSIS — I509 Heart failure, unspecified: Secondary | ICD-10-CM

## 2019-06-19 DIAGNOSIS — I480 Paroxysmal atrial fibrillation: Secondary | ICD-10-CM

## 2019-06-20 LAB — CUP PACEART REMOTE DEVICE CHECK
Battery Remaining Longevity: 138 mo
Battery Remaining Percentage: 100 %
Brady Statistic RV Percent Paced: 17 %
Date Time Interrogation Session: 20201013085100
HighPow Impedance: 39 Ohm
Implantable Lead Implant Date: 20060209
Implantable Lead Location: 753860
Implantable Lead Model: 158
Implantable Lead Serial Number: 156422
Implantable Pulse Generator Implant Date: 20150904
Lead Channel Impedance Value: 350 Ohm
Lead Channel Pacing Threshold Amplitude: 1 V
Lead Channel Pacing Threshold Pulse Width: 0.4 ms
Lead Channel Setting Pacing Amplitude: 2.4 V
Lead Channel Setting Pacing Pulse Width: 0.4 ms
Lead Channel Setting Sensing Sensitivity: 0.5 mV
Pulse Gen Serial Number: 191056

## 2019-06-29 NOTE — Progress Notes (Signed)
Remote ICD transmission.   

## 2019-07-02 ENCOUNTER — Other Ambulatory Visit: Payer: Self-pay

## 2019-07-02 ENCOUNTER — Ambulatory Visit (INDEPENDENT_AMBULATORY_CARE_PROVIDER_SITE_OTHER): Payer: Medicare HMO | Admitting: General Practice

## 2019-07-02 DIAGNOSIS — Z7901 Long term (current) use of anticoagulants: Secondary | ICD-10-CM | POA: Diagnosis not present

## 2019-07-02 DIAGNOSIS — I480 Paroxysmal atrial fibrillation: Secondary | ICD-10-CM

## 2019-07-02 LAB — POCT INR: INR: 2.4 (ref 2.0–3.0)

## 2019-07-02 NOTE — Patient Instructions (Signed)
Pre visit review using our clinic review tool, if applicable. No additional management support is needed unless otherwise documented below in the visit note.  Continue to take 1 tablet daily.  Re-check in 4 weeks.  

## 2019-07-26 NOTE — Progress Notes (Signed)
Cardiology Office Note   Date:  07/27/2019   ID:  Collin [name], DOB 1941-04-15, MRN [id number]  PCP:  [name], MD  Cardiologist:   Dietrich Pates, MD   F/u of NICM  And PAF    History of Present Illness: Collin [name] is a 78 y.o. male with a history of  NICM  S/p ICD, Last echo in 2018 LVEF improved to 55 to 60%.   Pt also has a history of atiral fib ( on coumadin), HL, DM, OSA and tongue a   He was seen by Irving Shows in cardiology in Dec 2019   At thte time he had cough, aches, chills   The pt was seen as a televisit in May 2020  Patient denies chest pain.  Breathing is okay.  He denies any palpitations.  Patient is very unsteady on his feet.  He has fallen a couple times.  Couple ribs.  Wife says he is weak he notes also aches and pains elsewhere some numbness in the first 3 fingers of his right hand.  Difficult to button.    Current Meds  Medication Sig  . acetaminophen (TYLENOL) 500 MG tablet Take 1,000 mg by mouth daily as needed for moderate pain.   . carvedilol (COREG) 25 MG tablet TAKE ONE TABLET BY MOUTH TWICE A DAY   NEEDS APPOINTMENT  . diltiazem (CARDIZEM) 90 MG tablet TAKE ONE TABLET BY MOUTH DAILY  . donepezil (ARICEPT) 10 MG tablet Take 1 tablet daily  . escitalopram (LEXAPRO) 20 MG tablet Take 1 tablet (20 mg total) by mouth daily.  . polyethylene glycol (MIRALAX) packet Take 17 g by mouth daily as needed. Available OTC  . potassium chloride (K-DUR) 10 MEQ tablet DISSOLVE 1 TABLET IN  WATER AND ADMINISTER VIA PEG TUDE DAILY  . rosuvastatin (CRESTOR) 10 MG tablet TAKE ONE TABLET BY MOUTH DAILY  . sodium fluoride (DENTA 5000 PLUS) 1.1 % CREA dental cream Apply gel to tooth brush. Brush teeth for 2 minutes. Spit out excess-DO NOT swallow. Repeat nightly.  . torsemide (DEMADEX) 20 MG tablet Take 1 tablet (20 mg total) by mouth daily.  Marland Kitchen warfarin (COUMADIN) 5 MG tablet TAKE ONE TABLET BY MOUTH DAILY OR AS DIRECTED BY ANTICOAGULATION CLINIC      Allergies:   Niaspan [niacin er]   Past Medical History:  Diagnosis Date  . AICD (automatic cardioverter/defibrillator) present   . Atrial fibrillation (HCC)   . Cancer (HCC)    tongue cancer  . Cardiomyopathy, nonischemic (HCC) 1998  . CHF (congestive heart failure) (HCC)   . Claustrophobia   . Diabetes mellitus   . Dysrhythmia   . Heart disease   . History of echocardiogram    Echo 8/18:  EF 55-60, no RWMA, severe LAE, normal RVSF, mild RAE  . History of radiation therapy 12/07/16- 01/24/17   Base of Tongue 70 Gy in 35 fractions  . Hyperlipidemia   . Memory loss   . OSA (obstructive sleep apnea)   . Shingles   . Umbilical hernia   . 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 It sometimes shows as a shadow on scans.     Past Surgical History:  Procedure Laterality Date  . CARDIAC DEFIBRILLATOR PLACEMENT  2006  . CATARACT EXTRACTION Bilateral    01/07/2016. 01/22/2016  . CYST EXCISION     multiple drainage to a cyst in his neck.   Marland Kitchen  DIRECT LARYNGOSCOPY N/A 11/16/2016   Procedure: DIRECT LARYNGOSCOPY AND BIOPSY;  Surgeon: [name], MD;  Location: [facility] OR;  Service: ENT;  Laterality: N/A;  . EYE SURGERY    . HERNIA REPAIR  1950s  . IMPLANTABLE CARDIOVERTER DEFIBRILLATOR (ICD) GENERATOR CHANGE N/A 05/10/2014   Procedure: ICD GENERATOR CHANGE;  Surgeon: [name], MD;  Location: [facility] CATH LAB;  Service: Cardiovascular;  Laterality: N/A;  . INSERTION OF MESH N/A 01/18/2018   Procedure: INSERTION OF MESH;  Surgeon: [name], MD;  Location: [facility] OR;  Service: General;  Laterality: N/A;  . IR GASTROSTOMY TUBE MOD SED  12/27/2016  . IR GASTROSTOMY TUBE REMOVAL  03/24/2018  . IR PATIENT EVAL TECH 0-60 MINS  01/17/2017  . MASS EXCISION Right 11/16/2016   Procedure: EXCISION RIGHT NECK LIPOMA;  Surgeon: [name], MD;  Location: [facility] OR;  Service: ENT;  Laterality: Right;  . MOHS SURGERY  01/01/2015   to top of  head  . MOUTH SURGERY     teeth removed 4 teeth extracted 02/2010  . RADICAL NECK DISSECTION Right 07/25/2017   Procedure: RIGHT NECK DISSECTION;  Surgeon: [name], MD;  Location: [facility] OR;  Service: ENT;  Laterality: Right;  . UMBILICAL HERNIA REPAIR  01/18/2018   w/mesh  . UMBILICAL HERNIA REPAIR N/A 01/18/2018   Procedure: UMBILICAL HERNIA REPAIR WITH MESH;  Surgeon: [name], MD;  Location: [facility] OR;  Service: General;  Laterality: N/A;     Social History:  The patient  reports that he has never smoked. He has never used smokeless tobacco. He reports current alcohol use. He reports that he does not use drugs.   Family History:  The patient's family history includes Heart disease in his mother; Hypertension in his father; Lupus in his mother; Stroke in his father; Sudden death in his mother.    ROS:  Please see the history of present illness. All other systems are reviewed and  Negative to the above problem except as noted.    PHYSICAL EXAM: VS:  BP 130/72   Pulse 91   Ht 6\' 1"  (1.854 m)   Wt 206 lb (93.4 kg)   SpO2 95%   BMI 27.18 kg/m   GEN: Well nourished, well developed, in no acute distress  HEENT: normal  Neck: no JVD, carotid bruits, or masses Cardiac: RRR; no murmurs, rubs, or gallops,no edema  Respiratory:  clear to auscultation bilaterally, normal work of breathing mild rhonchi. GI: soft, nontender, nondistended, + BS  No hepatomegaly  MS: no deformity Moving all extremities   Skin: warm and dry, no rash Neuro:  Strength and sensation are intact Psych: euthymic mood, full affect   EKG:  EKG is not ordered today.   Lipid Panel    Component Value Date/Time   CHOL 175 11/10/2017 1201   TRIG 105 11/10/2017 1201   HDL 65 11/10/2017 1201   CHOLHDL 2.7 11/10/2017 1201   CHOLHDL 5 11/13/2015 1525   VLDL 73.0 (H) 10/04/2014 0931   LDLCALC 89 11/10/2017 1201   LDLDIRECT 72.0 11/13/2015 1525      Wt Readings from Last 3 Encounters:  07/27/19 206  lb (93.4 kg)  05/30/19 213 lb 12.8 oz (97 kg)  04/04/19 203 lb (92.1 kg)      ASSESSMENT AND PLAN:  1.  History of nonischemic cardiomyopathy.  LVEF has normalized.  Volume status looks okay on current regimen.  Will check labs  PAF.  Rhythm is okay  he denies palpitations.  Would check labs today  Falling.  Patient is at high risk because he is on Coumadin.  Fallen and broken ribs already.  Will send to [facility].    3  Hx DM  Check an A1c  4  HL  Check lipids      F/U in 9 months      Current medicines are reviewed at length with the patient today.  The patient does not have concerns regarding medicines.  Signed, Dietrich Pates, MD  07/27/2019 10:07 AM    [facility] [address] [place], [place], [place]  [address] Phone: [phone]; Fax: [phone]

## 2019-07-27 ENCOUNTER — Other Ambulatory Visit: Payer: Self-pay

## 2019-07-27 ENCOUNTER — Encounter: Payer: Self-pay | Admitting: Internal Medicine

## 2019-07-27 ENCOUNTER — Ambulatory Visit (INDEPENDENT_AMBULATORY_CARE_PROVIDER_SITE_OTHER): Payer: Medicare HMO | Admitting: Internal Medicine

## 2019-07-27 VITALS — BP 130/72 | HR 91 | Ht 73.0 in | Wt 206.0 lb

## 2019-07-27 DIAGNOSIS — I480 Paroxysmal atrial fibrillation: Secondary | ICD-10-CM | POA: Diagnosis not present

## 2019-07-27 DIAGNOSIS — E782 Mixed hyperlipidemia: Secondary | ICD-10-CM | POA: Diagnosis not present

## 2019-07-27 DIAGNOSIS — E559 Vitamin D deficiency, unspecified: Secondary | ICD-10-CM

## 2019-07-27 DIAGNOSIS — E119 Type 2 diabetes mellitus without complications: Secondary | ICD-10-CM | POA: Diagnosis not present

## 2019-07-27 DIAGNOSIS — I509 Heart failure, unspecified: Secondary | ICD-10-CM

## 2019-07-27 DIAGNOSIS — I428 Other cardiomyopathies: Secondary | ICD-10-CM

## 2019-07-27 NOTE — Patient Instructions (Signed)
Medication Instructions:  No changes today *If you need a refill on your cardiac medications before your next appointment, please call your pharmacy*   Lab Work: Today: bmet, bnp, tsh, A1c, lipids, vit D If you have labs (blood work) drawn today and your tests are completely normal, you will receive your results only by: Marland Kitchen MyChart Message (if you have MyChart) OR . A paper copy in the mail If you have any lab test that is abnormal or we need to change your treatment, we will call you to review the results.  Testing/Procedures: none  Follow-Up: At [facility], you and your health needs are our priority.  As part of our continuing mission to provide you with exceptional heart care, we have created designated Provider Care Teams.  These Care Teams include your primary Cardiologist (physician) and Advanced Practice Providers (APPs -  Physician Assistants and Nurse Practitioners) who all work together to provide you with the care you need, when you need it.  Your next appointment:   9 month(s)  The format for your next appointment:   In Person  Provider:   Dorris Carnes, MD  Other Instructions You have been referred to [facility].

## 2019-07-28 LAB — HEMOGLOBIN A1C
Est. average glucose Bld gHb Est-mCnc: 120 mg/dL
Hgb A1c MFr Bld: 5.8 % — ABNORMAL HIGH (ref 4.8–5.6)

## 2019-07-28 LAB — BASIC METABOLIC PANEL
BUN/Creatinine Ratio: 25 — ABNORMAL HIGH (ref 10–24)
BUN: 18 mg/dL (ref 8–27)
CO2: 24 mmol/L (ref 20–29)
Calcium: 9 mg/dL (ref 8.6–10.2)
Chloride: 106 mmol/L (ref 96–106)
Creatinine, Ser: 0.73 mg/dL — ABNORMAL LOW (ref 0.76–1.27)
GFR calc Af Amer: 103 mL/min/{1.73_m2} (ref >59)
GFR calc non Af Amer: 89 mL/min/{1.73_m2} (ref >59)
Glucose: 95 mg/dL (ref 65–99)
Potassium: 4.4 mmol/L (ref 3.5–5.2)
Sodium: 143 mmol/L (ref 134–144)

## 2019-07-28 LAB — LIPID PANEL
Chol/HDL Ratio: 1.8 ratio (ref 0.0–5.0)
Cholesterol, Total: 136 mg/dL (ref 100–199)
HDL: 75 mg/dL (ref >39)
LDL Chol Calc (NIH): 40 mg/dL (ref 0–99)
Triglycerides: 122 mg/dL (ref 0–149)
VLDL Cholesterol Cal: 21 mg/dL (ref 5–40)

## 2019-07-28 LAB — PRO B NATRIURETIC PEPTIDE: NT-Pro BNP: 1692 pg/mL — ABNORMAL HIGH (ref 0–486)

## 2019-07-28 LAB — TSH: TSH: 1.9 u[IU]/mL (ref 0.450–4.500)

## 2019-07-28 LAB — VITAMIN D 25 HYDROXY (VIT D DEFICIENCY, FRACTURES): Vit D, 25-Hydroxy: 29.4 ng/mL — ABNORMAL LOW (ref 30.0–100.0)

## 2019-07-31 ENCOUNTER — Telehealth: Payer: Self-pay | Admitting: *Deleted

## 2019-07-31 DIAGNOSIS — I509 Heart failure, unspecified: Secondary | ICD-10-CM

## 2019-07-31 MED ORDER — TORSEMIDE 20 MG PO TABS: 20.0000 mg | ORAL_TABLET | Freq: Every day | ORAL | 3 refills | Status: DC

## 2019-07-31 MED ORDER — CHOLECALCIFEROL 100 MCG (4000 UT) PO TABS: 4000.0000 [IU] | ORAL_TABLET | Freq: Every day | ORAL | Status: DC

## 2019-07-31 NOTE — Telephone Encounter (Signed)
-----   Message from Fay Records, MD sent at 07/29/2019  5:45 PM EST ----- Thyroid function is normal Lpids are very good Electrolytes and kidney functon are normal Hgb A1C (glucose control) is good FLuid is up    WOuld take 1 1/2 torsemide 2x per week Vit D is deficient     Recomm 4000 mg daily Recheck BMET and BNP in 3 wks

## 2019-07-31 NOTE — Telephone Encounter (Signed)
Spoke with patient's wife and reviewed all labs. Pt will increase torsemide to 1 and half tabs twice a week and add vit D. Repeat lab appointment scheduled.

## 2019-08-06 ENCOUNTER — Encounter (INDEPENDENT_AMBULATORY_CARE_PROVIDER_SITE_OTHER): Payer: Medicare HMO | Admitting: Ophthalmology

## 2019-08-06 DIAGNOSIS — H35373 Puckering of macula, bilateral: Secondary | ICD-10-CM

## 2019-08-06 DIAGNOSIS — H35033 Hypertensive retinopathy, bilateral: Secondary | ICD-10-CM

## 2019-08-06 DIAGNOSIS — H43813 Vitreous degeneration, bilateral: Secondary | ICD-10-CM

## 2019-08-06 DIAGNOSIS — E113293 Type 2 diabetes mellitus with mild nonproliferative diabetic retinopathy without macular edema, bilateral: Secondary | ICD-10-CM

## 2019-08-06 DIAGNOSIS — E11319 Type 2 diabetes mellitus with unspecified diabetic retinopathy without macular edema: Secondary | ICD-10-CM | POA: Diagnosis not present

## 2019-08-06 DIAGNOSIS — I10 Essential (primary) hypertension: Secondary | ICD-10-CM

## 2019-08-06 DIAGNOSIS — H353131 Nonexudative age-related macular degeneration, bilateral, early dry stage: Secondary | ICD-10-CM

## 2019-08-08 ENCOUNTER — Ambulatory Visit: Payer: Medicare HMO

## 2019-08-17 ENCOUNTER — Other Ambulatory Visit: Payer: Self-pay

## 2019-08-20 ENCOUNTER — Other Ambulatory Visit: Payer: Medicare HMO | Admitting: *Deleted

## 2019-08-20 ENCOUNTER — Other Ambulatory Visit: Payer: Self-pay

## 2019-08-20 ENCOUNTER — Ambulatory Visit (INDEPENDENT_AMBULATORY_CARE_PROVIDER_SITE_OTHER): Payer: Medicare HMO | Admitting: General Practice

## 2019-08-20 DIAGNOSIS — I509 Heart failure, unspecified: Secondary | ICD-10-CM | POA: Diagnosis not present

## 2019-08-20 DIAGNOSIS — I480 Paroxysmal atrial fibrillation: Secondary | ICD-10-CM

## 2019-08-20 DIAGNOSIS — Z7901 Long term (current) use of anticoagulants: Secondary | ICD-10-CM | POA: Diagnosis not present

## 2019-08-20 LAB — BASIC METABOLIC PANEL
BUN/Creatinine Ratio: 26 — ABNORMAL HIGH (ref 10–24)
BUN: 25 mg/dL (ref 8–27)
CO2: 24 mmol/L (ref 20–29)
Calcium: 9.1 mg/dL (ref 8.6–10.2)
Chloride: 103 mmol/L (ref 96–106)
Creatinine, Ser: 0.95 mg/dL (ref 0.76–1.27)
GFR calc Af Amer: 88 mL/min/{1.73_m2} (ref >59)
GFR calc non Af Amer: 76 mL/min/{1.73_m2} (ref >59)
Glucose: 94 mg/dL (ref 65–99)
Potassium: 4.2 mmol/L (ref 3.5–5.2)
Sodium: 140 mmol/L (ref 134–144)

## 2019-08-20 LAB — PRO B NATRIURETIC PEPTIDE: NT-Pro BNP: 2186 pg/mL — ABNORMAL HIGH (ref 0–486)

## 2019-08-20 LAB — POCT INR: INR: 2.4 (ref 2.0–3.0)

## 2019-08-20 NOTE — Patient Instructions (Addendum)
Pre visit review using our clinic review tool, if applicable. No additional management support is needed unless otherwise documented below in the visit note.  Continue to take 1 tablet daily.  Re-check in 6 weeks.

## 2019-08-21 ENCOUNTER — Encounter: Payer: Self-pay | Admitting: Physical Therapy

## 2019-08-21 ENCOUNTER — Ambulatory Visit: Payer: Medicare HMO | Attending: Internal Medicine | Admitting: Physical Therapy

## 2019-08-21 ENCOUNTER — Other Ambulatory Visit: Payer: Self-pay | Admitting: *Deleted

## 2019-08-21 DIAGNOSIS — M6281 Muscle weakness (generalized): Secondary | ICD-10-CM | POA: Diagnosis not present

## 2019-08-21 DIAGNOSIS — R2689 Other abnormalities of gait and mobility: Secondary | ICD-10-CM | POA: Insufficient documentation

## 2019-08-21 DIAGNOSIS — I480 Paroxysmal atrial fibrillation: Secondary | ICD-10-CM

## 2019-08-21 DIAGNOSIS — I509 Heart failure, unspecified: Secondary | ICD-10-CM

## 2019-08-21 DIAGNOSIS — R2681 Unsteadiness on feet: Secondary | ICD-10-CM | POA: Insufficient documentation

## 2019-08-22 ENCOUNTER — Encounter: Payer: Self-pay | Admitting: Physical Therapy

## 2019-08-22 NOTE — Therapy (Signed)
[facility] [address] [place], [place], [address] Phone: [phone]   Fax:  [phone]  Physical Therapy Evaluation  Patient Details  Name: Collin [name] MRN: [id number] Date of Birth: [birth year] Referring Provider (PT): Dietrich Pates, MD   Encounter Date: 08/21/2019  PT End of Session - 08/22/19 1955    Visit Number  1    Number of Visits  9    Date for PT Re-Evaluation  09/24/19    Authorization Type  [employer] - 10th visit progress note needed    Authorization Time Period  08-21-19 - 10-22-19    PT Start Time  1447    PT Stop Time  1533    PT Time Calculation (min)  46 min    Activity Tolerance  Patient tolerated treatment well    Behavior During Therapy  [facility] for tasks assessed/performed       Past Medical History:  Diagnosis Date  . AICD (automatic cardioverter/defibrillator) present   . Atrial fibrillation (HCC)   . Cancer (HCC)    tongue cancer  . Cardiomyopathy, nonischemic (HCC) 1998  . CHF (congestive heart failure) (HCC)   . Claustrophobia   . Diabetes mellitus   . Dysrhythmia   . Heart disease   . History of echocardiogram    Echo 8/18:  EF 55-60, no RWMA, severe LAE, normal RVSF, mild RAE  . History of radiation therapy 12/07/16- 01/24/17   Base of Tongue 70 Gy in 35 fractions  . Hyperlipidemia   . Memory loss   . OSA (obstructive sleep apnea)   . Shingles   . Umbilical hernia   . 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 It sometimes shows as a shadow on scans.     Past Surgical History:  Procedure Laterality Date  . CARDIAC DEFIBRILLATOR PLACEMENT  2006  . CATARACT EXTRACTION Bilateral    01/07/2016. 01/22/2016  . CYST EXCISION     multiple drainage to a cyst in his neck.   Marland Kitchen DIRECT LARYNGOSCOPY N/A 11/16/2016   Procedure: DIRECT LARYNGOSCOPY AND BIOPSY;  Surgeon: [name], MD;  Location: [facility] OR;  Service: ENT;   Laterality: N/A;  . EYE SURGERY    . HERNIA REPAIR  1950s  . IMPLANTABLE CARDIOVERTER DEFIBRILLATOR (ICD) GENERATOR CHANGE N/A 05/10/2014   Procedure: ICD GENERATOR CHANGE;  Surgeon: [name], MD;  Location: [facility] CATH LAB;  Service: Cardiovascular;  Laterality: N/A;  . INSERTION OF MESH N/A 01/18/2018   Procedure: INSERTION OF MESH;  Surgeon: [name], MD;  Location: [facility] OR;  Service: General;  Laterality: N/A;  . IR GASTROSTOMY TUBE MOD SED  12/27/2016  . IR GASTROSTOMY TUBE REMOVAL  03/24/2018  . IR PATIENT EVAL TECH 0-60 MINS  01/17/2017  . MASS EXCISION Right 11/16/2016   Procedure: EXCISION RIGHT NECK LIPOMA;  Surgeon: [name], MD;  Location: [facility] OR;  Service: ENT;  Laterality: Right;  . MOHS SURGERY  01/01/2015   to top of head  . MOUTH SURGERY     teeth removed 4 teeth extracted 02/2010  . RADICAL NECK DISSECTION Right 07/25/2017   Procedure: RIGHT NECK DISSECTION;  Surgeon: [name], MD;  Location: [facility] OR;  Service: ENT;  Laterality: Right;  . UMBILICAL HERNIA REPAIR  01/18/2018   w/mesh  . UMBILICAL HERNIA REPAIR N/A 01/18/2018   Procedure: UMBILICAL HERNIA REPAIR WITH MESH;  Surgeon:  [name], MD;  Location: [facility] OR;  Service: General;  Laterality: N/A;    There were no vitals filed for this visit.   Subjective Assessment - 08/21/19 1458    Subjective  Pt presents to PT eval - Pt had cancer 2 yrs ago and lost >100# lbs - wife thinks he now has no muscle; pt states he falls frequently - reports approx. 5 falls within past 6 months; pt is amb. 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  No/denies   reports stiffness in joints        [facility] PT Assessment - 08/22/19 0001       Assessment   Medical Diagnosis  Gait and balance dysfunction     Referring Provider (PT)  Dietrich Pates, MD    Onset Date/Surgical Date  --   approx. 1 yr ago    Prior Therapy  PT 2 yrs ago at [facility] for lymphedema; also had ST at this facility for dysphagia approx. 2 yrs ago      Precautions   Precautions  Fall      Balance Screen   Has the patient fallen in the past 6 months  Yes    How many times?  5    Has the patient had a decrease in activity level because of a fear of falling?   Yes    Is the patient reluctant to leave their home because of a fear of falling?   No      Home Environment   Living Environment  Private residence    Type of Home  House    Home Access  Stairs to enter    Entrance Stairs-Number of Steps  4    Entrance Stairs-Rails  Right;Left    Home Layout  Two level   split level   Alternate Level Stairs-Number of Steps  7      Prior Function   Level of Independence  Independent with basic ADLs;Independent with household mobility without device;Independent with community mobility with device   uses walking stick   Leisure  pt makes walking sticks      Posture/Postural Control   Posture/Postural Control  Postural limitations    Posture Comments  Pt amb. with bil. knees slightly flexed,  Lt foot externally rotated ; pt has wide BOS and pelvis in posterior tilt with arms slightly posterior with min. arm swing      ROM / Strength   AROM / PROM / Strength  Strength      Strength   Overall Strength  Within functional limits for tasks performed      Transfers   Transfers  Sit to Stand;Stand to Sit    Sit to Stand  6: Modified independent (Device/Increase time)   from mat table   Stand to Sit  6: Modified independent (Device/Increase time);Uncontrolled descent   some mild uncontrolled descent to low mat table   Number of Reps  Other reps (comment)   2     Ambulation/Gait   Ambulation/Gait  Yes    Ambulation/Gait Assistance  5: Supervision     Ambulation Distance (Feet)  100 Feet    Assistive device  None   pt has walking stick but did not use during eval   Gait Pattern  Wide base of support;Decreased arm swing - right;Decreased arm swing - left;Decreased step length - right;Decreased step length - left   pt has posterior pelvic tilt; bil. knees flexed   Ambulation Surface  Level;Indoor    Gait velocity  17.42 = 1.88 ft/sec      Standardized Balance Assessment   Standardized Balance Assessment  Berg Balance Test;Timed Up and Go Test      Berg Balance Test   Sit to Stand  Able to stand without using hands and stabilize independently    Standing Unsupported  Able to stand safely 2 minutes    Sitting with Back Unsupported but Feet Supported on Floor or Stool  Able to sit safely and securely 2 minutes    Stand to Sit  Controls descent by using hands    Standing Unsupported with Eyes Closed  Able to stand 10 seconds with supervision    From Standing Position, Pick up Object from Floor  Able to pick up shoe, needs supervision    Standing on One Leg  Tries to lift leg/unable to hold 3 seconds but remains standing independently      Timed Up and Go Test   Normal TUG (seconds)  19.35   secs - no device               Objective measurements completed on examination: See above findings.              PT Education - 08/22/19 1954    Education Details  results of eval discussed with pt and wife; wife stated he did not do exercises on his own - informed pt of importance of compliance with HEP in order for progress to be made    Person(s) Educated  Patient;Spouse    Methods  Explanation    Comprehension  Verbalized understanding       PT Short Term Goals - 08/22/19 2008      PT SHORT TERM GOAL #1   Title  same as LTG's (4 weeks ELOS)        PT Long Term Goals - 08/22/19 2008      PT LONG TERM GOAL #1   Title  Pt will increase Berg balance test score by at least 5 points for reduced fall risk.    Baseline   to be completed next session    Time  4    Period  Weeks    Status  New    Target Date  09/21/19      PT LONG TERM GOAL #2   Title  Improve TUG score from 19.35 secs to </= 14.5 secs with no device to demo increased functional mobility with reduced fall risk.    Baseline  19.35 secs with no device    Time  4    Period  Weeks    Status  New    Target Date  09/21/19      PT LONG TERM GOAL #3   Title  Increase gait velocity from 1.88 ft/sec to >/= 2.1 ft/sec with no device for incr. gait efficiency.    Baseline  17.42 secs = 1.88 ft/sec    Time  4    Period  Weeks    Status  New    Target Date  09/21/19      PT LONG TERM GOAL #4   Title  Pt will be  independent in HEP for balance exercises.    Time  4    Period  Weeks    Status  New    Target Date  09/21/19             Plan - 08/22/19 1957    Clinical Impression Statement  Pt is a 78 yr old gentleman who presents with balance and gait dysfunction and postural deviations.  Pt states his balance problem started after he lost over 100# after having tongue/throat cancer and had difficulty with swallowing.  Pt reports he has had 5 falls in the past 6 months.  Pt is at fall risk per TUG score of 19.35 secs.  Pt will benefit from skilled PT to address balance, gait and postural abnormalities.    Personal Factors and Comorbidities  Comorbidity 2;Past/Current Experience;Behavior Pattern;Time since onset of injury/illness/exacerbation    Comorbidities  nonischemic cardiomyopathy, defibrillator implant 2006; tongue cancer 2018; CHF:  DM    Examination-Activity Limitations  Locomotion Level;Stand;Stairs;Squat;Carry;Bend    Examination-Participation Restrictions  Meal Prep;Driving;Shop;Yard Work    Stability/Clinical Decision Making  Evolving/Moderate complexity    Clinical Decision Making  Moderate    Rehab Potential  Good    PT Frequency  2x / week    PT Duration  4 weeks   will plan to renew if pt progresses & wishes to continue PT    PT Treatment/Interventions  ADLs/Self Care Home Management;Gait training;Stair training;Therapeutic activities;Therapeutic exercise;Balance training;Neuromuscular re-education;Patient/family education    PT Next Visit Plan  please complete Sharlene Motts (I started but did not finish) -- can start over if you would like (LTG has been set); begin balance HEP; gait train without device    PT Home Exercise Plan  balance exs - kicks, marching, sidestepping with good posture, tandem & SLS; ankle sways;  heel raises for gastroc strengthening    Consulted and Agree with Plan of Care  Patient;Family member/caregiver    Family Member Consulted  wife [name]       Patient will benefit from skilled therapeutic intervention in order to improve the following deficits and impairments:  Abnormal gait, Decreased balance, Other (comment), Decreased cognition, Decreased strength, Postural dysfunction  Visit Diagnosis: Other abnormalities of gait and mobility - Plan: PT plan of care cert/re-cert  Unsteadiness on feet - Plan: PT plan of care cert/re-cert  Muscle weakness (generalized) - Plan: PT plan of care cert/re-cert     Problem List Patient Active Problem List   Diagnosis Date Noted  . Malnutrition of moderate degree 11/18/2017  . Supratherapeutic INR 11/14/2017  . AKI (acute kidney injury) (HCC) 11/12/2017  . Vein disorder   . Umbilical hernia   . Shingles   . History of radiation therapy   . History of echocardiogram   . Heart disease   . Dysrhythmia   . Claustrophobia   . CHF (congestive heart failure) (HCC)   . Cancer (HCC)   . Atrial fibrillation (HCC)   . AICD (automatic cardioverter/defibrillator) present   . History of head and neck cancer 07/25/2017  . Long term (current) use of anticoagulants 06/22/2017  . 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 Thrush, oral 12/21/2016  . Weight loss, abnormal 12/21/2016  . Malignant neoplasm of base of tongue (HCC)  11/24/2016  . Umbilical hernia without obstruction and without gangrene   . Pressure injury of skin 11/14/2016  . Tongue mass: Per Ct neck 11/10/2016 11/12/2016  . SBO (small bowel obstruction) (HCC) 11/11/2016  . Encounter for therapeutic drug  monitoring 10/25/2013  . Diabetic peripheral neuropathy (HCC) 10/14/2011  . Hypotension 08/20/2011  . Acute renal failure (HCC) 08/20/2011  . OSA (obstructive sleep apnea) 04/15/2011  . ICD-[employer] 03/30/2011  . Chronic diastolic CHF (congestive heart failure) (HCC) 03/30/2011  . PAF (paroxysmal atrial fibrillation) (HCC) 12/14/2010  . Nonischemic cardiomyopathy (HCC) 12/14/2010  . Type 2 diabetes mellitus (HCC) 12/14/2010  . Hyperlipidemia 12/14/2010  . Cardiomyopathy, nonischemic (HCC) 09/06/1996    Makynli Stills, Donavan Burnet, PT 08/22/2019, 8:31 PM  [facility] [address] [place], [place], [address] Phone: [phone]   Fax:  [phone]  Name: Collin [name] MRN: [id number] Date of Birth: [birth year]

## 2019-08-23 ENCOUNTER — Other Ambulatory Visit: Payer: Self-pay

## 2019-08-23 ENCOUNTER — Encounter: Payer: Self-pay | Admitting: Physical Therapy

## 2019-08-23 ENCOUNTER — Ambulatory Visit: Payer: Medicare HMO | Admitting: Physical Therapy

## 2019-08-23 DIAGNOSIS — M6281 Muscle weakness (generalized): Secondary | ICD-10-CM

## 2019-08-23 DIAGNOSIS — R2689 Other abnormalities of gait and mobility: Secondary | ICD-10-CM

## 2019-08-23 DIAGNOSIS — R2681 Unsteadiness on feet: Secondary | ICD-10-CM

## 2019-08-23 NOTE — Patient Instructions (Signed)
Access Code: [email or web address]  URL: [email or web address]  Date: 08/23/2019  Prepared by: Nita Sells   Exercises  Standing March with Counter Support - 10 reps - 2x daily - 5x weekly  Standing Hip Abduction with Counter Support - 10 reps - 2x daily - 5x weekly  Standing Tandem Balance with Counter Support - 10 reps - 2x daily - 5x weekly  Standing Single Leg Stance with Counter Support - 10 reps - 2x daily - 5x weekly  Heel rises with counter support - 10 reps - 2x daily - 5x weekly  Side Stepping with Counter Support - 10 reps - 2x daily - 5x weekly  Standing Hip Flexion with Counter Support - 10 reps - 2x daily - 5x weekly

## 2019-08-23 NOTE — Therapy (Signed)
[facility] [address] [place], [place], [address] Phone: [phone]   Fax:  [phone]  Physical Therapy Treatment  Patient Details  Name: Collin [name] MRN: [id number] Date of Birth: 1940-11-27 Referring Provider (PT): Dietrich Pates, MD   Encounter Date: 08/23/2019  PT End of Session - 08/23/19 1627    Visit Number  2    Number of Visits  9    Date for PT Re-Evaluation  09/24/19    Authorization Type  [employer] Medicare - 10th visit progress note needed    Authorization Time Period  08-21-19 - 10-22-19    PT Start Time  1540    PT Stop Time  1620    PT Time Calculation (min)  40 min    Activity Tolerance  Patient tolerated treatment well    Behavior During Therapy  [facility] for tasks assessed/performed       Past Medical History:  Diagnosis Date  . AICD (automatic cardioverter/defibrillator) present   . Atrial fibrillation (HCC)   . Cancer (HCC)    tongue cancer  . Cardiomyopathy, nonischemic (HCC) 1998  . CHF (congestive heart failure) (HCC)   . Claustrophobia   . Diabetes mellitus   . Dysrhythmia   . Heart disease   . History of echocardiogram    Echo 8/18:  EF 55-60, no RWMA, severe LAE, normal RVSF, mild RAE  . History of radiation therapy 12/07/16- 01/24/17   Base of Tongue 70 Gy in 35 fractions  . Hyperlipidemia   . Memory loss   . OSA (obstructive sleep apnea)   . Shingles   . Umbilical hernia   . 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 It sometimes shows as a shadow on scans.     Past Surgical History:  Procedure Laterality Date  . CARDIAC DEFIBRILLATOR PLACEMENT  2006  . CATARACT EXTRACTION Bilateral    01/07/2016. 01/22/2016  . CYST EXCISION     multiple drainage to a cyst in his neck.   Marland Kitchen DIRECT LARYNGOSCOPY N/A 11/16/2016   Procedure: DIRECT LARYNGOSCOPY AND BIOPSY;  Surgeon: [name], MD;  Location: [facility] OR;  Service: ENT;   Laterality: N/A;  . EYE SURGERY    . HERNIA REPAIR  1950s  . IMPLANTABLE CARDIOVERTER DEFIBRILLATOR (ICD) GENERATOR CHANGE N/A 05/10/2014   Procedure: ICD GENERATOR CHANGE;  Surgeon: [name], MD;  Location: [facility] CATH LAB;  Service: Cardiovascular;  Laterality: N/A;  . INSERTION OF MESH N/A 01/18/2018   Procedure: INSERTION OF MESH;  Surgeon: [name], MD;  Location: [facility] OR;  Service: General;  Laterality: N/A;  . IR GASTROSTOMY TUBE MOD SED  12/27/2016  . IR GASTROSTOMY TUBE REMOVAL  03/24/2018  . IR PATIENT EVAL TECH 0-60 MINS  01/17/2017  . MASS EXCISION Right 11/16/2016   Procedure: EXCISION RIGHT NECK LIPOMA;  Surgeon: [name], MD;  Location: [facility] OR;  Service: ENT;  Laterality: Right;  . MOHS SURGERY  01/01/2015   to top of head  . MOUTH SURGERY     teeth removed 4 teeth extracted 02/2010  . RADICAL NECK DISSECTION Right 07/25/2017   Procedure: RIGHT NECK DISSECTION;  Surgeon: [name], MD;  Location: [facility] OR;  Service: ENT;  Laterality: Right;  . UMBILICAL HERNIA REPAIR  01/18/2018   w/mesh  . UMBILICAL HERNIA REPAIR N/A 01/18/2018   Procedure: UMBILICAL HERNIA REPAIR WITH MESH;  Surgeon:  [name], MD;  Location: [facility] OR;  Service: General;  Laterality: N/A;    There were no vitals filed for this visit.  Subjective Assessment - 08/23/19 1544    Subjective  "My body feels like a toothache all the time."  Denies falls or changes since last visit.  States "toothache" is there all the time.    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  No/denies         [facility] Adult PT Treatment/Exercise - 08/23/19 0001      Transfers   Transfers  Sit to Stand;Stand to Sit    Sit to Stand  6: Modified independent (Device/Increase  time)    Stand to Sit  6: Modified independent (Device/Increase time);Uncontrolled descent      Standardized Balance Assessment   Standardized Balance Assessment  Berg Balance Test      Berg Balance Test   Sit to Stand  Able to stand without using hands and stabilize independently    Standing Unsupported  Able to stand safely 2 minutes    Sitting with Back Unsupported but Feet Supported on Floor or Stool  Able to sit safely and securely 2 minutes    Stand to Sit  Sits safely with minimal use of hands    Transfers  Able to transfer safely, minor use of hands    Standing Unsupported with Eyes Closed  Able to stand 10 seconds safely    Standing Ubsupported with Feet Together  Able to place feet together independently and stand 1 minute safely    From Standing, Reach Forward with Outstretched Arm  Can reach confidently >25 cm (10")    From Standing Position, Pick up Object from Floor  Able to pick up shoe safely and easily    From Standing Position, Turn to Look Behind Over each Shoulder  Looks behind one side only/other side shows less weight shift    Turn 360 Degrees  Able to turn 360 degrees safely in 4 seconds or less    Standing Unsupported, Alternately Place Feet on Step/Stool  Able to complete >2 steps/needs minimal assist    Standing Unsupported, One Foot in Front  Able to take small step independently and hold 30 seconds    Standing on One Leg  Tries to lift leg/unable to hold 3 seconds but remains standing independently    Total Score  47      Exercises   Exercises  Other Exercises   See HEP for exercises performed during session       Access Code: [email or web address]  URL: [email or web address]  Date: 08/23/2019  Prepared by: Modena Morrow   Exercises  Standing March with Counter Support - 10 reps Standing Hip Abduction with Counter Support - 10 reps  Standing Tandem Balance with Counter Support - 10 reps Standing Single Leg Stance with Counter Support - 10 reps   Heel rises with counter support - 10 reps  Side Stepping with Counter Support - 10 reps Standing Hip Flexion with Counter Support - 10 reps        PT Education - 08/23/19 1626    Education Details  HEP    Person(s) Educated  Patient;Spouse    Methods  Explanation;Demonstration;Handout  Comprehension  Verbalized understanding;Returned demonstration       PT Short Term Goals - 08/22/19 2008      PT SHORT TERM GOAL #1   Title  same as LTG's (4 weeks ELOS)        PT Long Term Goals - 08/22/19 2008      PT LONG TERM GOAL #1   Title  Pt will increase Berg balance test score by at least 5 points for reduced fall risk.    Baseline  to be completed next session    Time  4    Period  Weeks    Status  New    Target Date  09/21/19      PT LONG TERM GOAL #2   Title  Improve TUG score from 19.35 secs to </= 14.5 secs with no device to demo increased functional mobility with reduced fall risk.    Baseline  19.35 secs with no device    Time  4    Period  Weeks    Status  New    Target Date  09/21/19      PT LONG TERM GOAL #3   Title  Increase gait velocity from 1.88 ft/sec to >/= 2.1 ft/sec with no device for incr. gait efficiency.    Baseline  17.42 secs = 1.88 ft/sec    Time  4    Period  Weeks    Status  New    Target Date  09/21/19      PT LONG TERM GOAL #4   Title  Pt will be independent in HEP for balance exercises.    Time  4    Period  Weeks    Status  New    Target Date  09/21/19            Plan - 08/23/19 1630    Clinical Impression Statement  Skilled session focused on initiating HEP and scoring BERG.  Pt scored 47/56 on BERG today.  Continue PT per POC.    Personal Factors and Comorbidities  Comorbidity 2;Past/Current Experience;Behavior Pattern;Time since onset of injury/illness/exacerbation    Comorbidities  nonischemic cardiomyopathy, defibrillator implant 2006; tongue cancer 2018; CHF:  DM    Examination-Activity Limitations  Locomotion  Level;Stand;Stairs;Squat;Carry;Bend    Examination-Participation Restrictions  Meal Prep;Driving;Shop;Yard Work    Stability/Clinical Decision Making  Evolving/Moderate complexity    Rehab Potential  Good    PT Frequency  2x / week    PT Duration  4 weeks   will plan to renew if pt progresses & wishes to continue PT   PT Treatment/Interventions  ADLs/Self Care Home Management;Gait training;Stair training;Therapeutic activities;Therapeutic exercise;Balance training;Neuromuscular re-education;Patient/family education    PT Next Visit Plan  Review HEP and revise as needed, gait train without device, balance on compliant surfaces    PT Home Exercise Plan  [employer] [email or web address]    Consulted and Agree with Plan of Care  Patient;Family member/caregiver    Family Member Consulted  wife [name]       Patient will benefit from skilled therapeutic intervention in order to improve the following deficits and impairments:  Abnormal gait, Decreased balance, Other (comment), Decreased cognition, Decreased strength, Postural dysfunction  Visit Diagnosis: Other abnormalities of gait and mobility  Unsteadiness on feet  Muscle weakness (generalized)     Problem List Patient Active Problem List   Diagnosis Date Noted  . Malnutrition of moderate degree 11/18/2017  . Supratherapeutic INR 11/14/2017  . AKI (acute kidney injury) (HCC) 11/12/2017  .  Vein disorder   . Umbilical hernia   . Shingles   . History of radiation therapy   . History of echocardiogram   . Heart disease   . Dysrhythmia   . Claustrophobia   . CHF (congestive heart failure) (HCC)   . Cancer (HCC)   . Atrial fibrillation (HCC)   . AICD (automatic cardioverter/defibrillator) present   . History of head and neck cancer 07/25/2017  . Long term (current) use of anticoagulants 06/22/2017  . 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 Thrush, oral 12/21/2016  . Weight loss, abnormal 12/21/2016  . Malignant  neoplasm of base of tongue (HCC) 11/24/2016  . Umbilical hernia without obstruction and without gangrene   . Pressure injury of skin 11/14/2016  . Tongue mass: Per Ct neck 11/10/2016 11/12/2016  . SBO (small bowel obstruction) (HCC) 11/11/2016  . Encounter for therapeutic drug monitoring 10/25/2013  . Diabetic peripheral neuropathy (HCC) 10/14/2011  . Hypotension 08/20/2011  . Acute renal failure (HCC) 08/20/2011  . OSA (obstructive sleep apnea) 04/15/2011  . ICD-[employer] 03/30/2011  . Chronic diastolic CHF (congestive heart failure) (HCC) 03/30/2011  . PAF (paroxysmal atrial fibrillation) (HCC) 12/14/2010  . Nonischemic cardiomyopathy (HCC) 12/14/2010  . Type 2 diabetes mellitus (HCC) 12/14/2010  . Hyperlipidemia 12/14/2010  . Cardiomyopathy, nonischemic (HCC) 09/06/1996   Newell Coral, PTA [facility] 08/23/19 4:35 PM Phone: [phone] Fax: [phone]   [facility] [address] [place], [place], [address] Phone: [phone]   Fax:  [phone]  Name: Collin [name] MRN: [id number] Date of Birth: March 17, 1941

## 2019-08-28 ENCOUNTER — Ambulatory Visit: Payer: Medicare HMO | Admitting: Physical Therapy

## 2019-08-28 ENCOUNTER — Encounter: Payer: Self-pay | Admitting: Physical Therapy

## 2019-08-28 ENCOUNTER — Other Ambulatory Visit: Payer: Self-pay

## 2019-08-28 DIAGNOSIS — M6281 Muscle weakness (generalized): Secondary | ICD-10-CM | POA: Diagnosis not present

## 2019-08-28 DIAGNOSIS — R2689 Other abnormalities of gait and mobility: Secondary | ICD-10-CM

## 2019-08-28 DIAGNOSIS — R2681 Unsteadiness on feet: Secondary | ICD-10-CM

## 2019-08-28 NOTE — Therapy (Signed)
[facility] [address] [place], [place], [address] Phone: [phone]   Fax:  [phone]  Physical Therapy Treatment  Patient Details  Name: Collin [name] MRN: [id number] Date of Birth: 11-May-1941 Referring Provider (PT): Dorris Carnes, MD   Encounter Date: 08/28/2019  PT End of Session - 08/28/19 0922    Visit Number  3    Number of Visits  9    Date for PT Re-Evaluation  09/24/19    Authorization Type  A[employer]na Medicare - 10th visit progress note needed    Authorization Time Period  08-21-19 - 10-22-19    PT Start Time  0848    PT Stop Time  0934    PT Time Calculation (min)  46 min    Equipment Utilized During Treatment  Gait belt    Activity Tolerance  Patient tolerated treatment well    Behavior During Therapy  [facility] for tasks assessed/performed       Past Medical History:  Diagnosis Date  . AICD (automatic cardioverter/defibrillator) present   . Atrial fibrillation ([place])   . Cancer (HCC)    tongue cancer  . Cardiomyopathy, nonischemic ([place]) 1998  . CHF (congestive heart failure) ([place])   . Claustrophobia   . Diabetes mellitus   . Dysrhythmia   . Heart disease   . History of echocardiogram    Echo 8/18:  EF 55-60, no RWMA, severe LAE, normal RVSF, mild RAE  . History of radiation therapy 12/07/16- 01/24/17   Base of Tongue 70 Gy in 35 fractions  . Hyperlipidemia   . Memory loss   . OSA (obstructive sleep apnea)   . Shingles   . Umbilical hernia   . 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 It sometimes shows as a shadow on scans.     Past Surgical History:  Procedure Laterality Date  . CARDIAC DEFIBRILLATOR PLACEMENT  2006  . CATARACT EXTRACTION Bilateral    01/07/2016. 01/22/2016  . CYST EXCISION     multiple drainage to a cyst in his neck.   Marland Kitchen DIRECT LARYNGOSCOPY N/A 11/16/2016   Procedure: DIRECT LARYNGOSCOPY AND BIOPSY;  Surgeon: Rozetta Nunnery, MD;  Location: [place];  Service: ENT;  Laterality: N/A;  . EYE SURGERY    . HERNIA REPAIR  1950s  . IMPLANTABLE CARDIOVERTER DEFIBRILLATOR (ICD) GENERATOR CHANGE N/A 05/10/2014   Procedure: ICD GENERATOR CHANGE;  Surgeon: [name], MD;  Location: [facility] CATH LAB;  Service: Cardiovascular;  Laterality: N/A;  . INSERTION OF MESH N/A 01/18/2018   Procedure: INSERTION OF MESH;  Surgeon: Jovita Kussmaul, MD;  Location: [place];  Service: General;  Laterality: N/A;  . IR GASTROSTOMY TUBE MOD SED  12/27/2016  . IR GASTROSTOMY TUBE REMOVAL  03/24/2018  . IR PATIENT EVAL TECH 0-60 MINS  01/17/2017  . MASS EXCISION Right 11/16/2016   Procedure: EXCISION RIGHT NECK LIPOMA;  Surgeon: Rozetta Nunnery, MD;  Location: [facility];  Service: ENT;  Laterality: Right;  . MOHS SURGERY  01/01/2015   to top of head  . MOUTH SURGERY     teeth removed 4 teeth extracted 02/2010  . RADICAL NECK DISSECTION Right 07/25/2017   Procedure: RIGHT NECK DISSECTION;  Surgeon: Rozetta Nunnery, MD;  Location: [facility];  Service: ENT;  Laterality: Right;  . UMBILICAL HERNIA REPAIR  01/18/2018   w/mesh  . UMBILICAL HERNIA REPAIR N/A 01/18/2018  [facility] [address] [place], [place], [address] Phone: [phone]   Fax:  [phone]  Physical Therapy Treatment  Patient Details  Name: Collin [name] MRN: [id number] Date of Birth: 11-May-1941 Referring Provider (PT): Dorris Carnes, MD   Encounter Date: 08/28/2019  PT End of Session - 08/28/19 0922    Visit Number  3    Number of Visits  9    Date for PT Re-Evaluation  09/24/19    Authorization Type  Aetna Medicare - 10th visit progress note needed    Authorization Time Period  08-21-19 - 10-22-19    PT Start Time  0848    PT Stop Time  0934    PT Time Calculation (min)  46 min    Equipment Utilized During Treatment  Gait belt    Activity Tolerance  Patient tolerated treatment well    Behavior During Therapy  [facility] for tasks assessed/performed       Past Medical History:  Diagnosis Date  . AICD (automatic cardioverter/defibrillator) present   . Atrial fibrillation ([place])   . Cancer (HCC)    tongue cancer  . Cardiomyopathy, nonischemic ([place]) 1998  . CHF (congestive heart failure) ([place])   . Claustrophobia   . Diabetes mellitus   . Dysrhythmia   . Heart disease   . History of echocardiogram    Echo 8/18:  EF 55-60, no RWMA, severe LAE, normal RVSF, mild RAE  . History of radiation therapy 12/07/16- 01/24/17   Base of Tongue 70 Gy in 35 fractions  . Hyperlipidemia   . Memory loss   . OSA (obstructive sleep apnea)   . Shingles   . Umbilical hernia   . 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 It sometimes shows as a shadow on scans.     Past Surgical History:  Procedure Laterality Date  . CARDIAC DEFIBRILLATOR PLACEMENT  2006  . CATARACT EXTRACTION Bilateral    01/07/2016. 01/22/2016  . CYST EXCISION     multiple drainage to a cyst in his neck.   Marland Kitchen DIRECT LARYNGOSCOPY N/A 11/16/2016   Procedure: DIRECT LARYNGOSCOPY AND BIOPSY;  Surgeon: Rozetta Nunnery, MD;  Location: [facility] [place];  Service: ENT;  Laterality: N/A;  . EYE SURGERY    . HERNIA REPAIR  1950s  . IMPLANTABLE CARDIOVERTER DEFIBRILLATOR (ICD) GENERATOR CHANGE N/A 05/10/2014   Procedure: ICD GENERATOR CHANGE;  Surgeon: [name], MD;  Location: [facility] CATH LAB;  Service: Cardiovascular;  Laterality: N/A;  . INSERTION OF MESH N/A 01/18/2018   Procedure: INSERTION OF MESH;  Surgeon: Jovita Kussmaul, MD;  Location: [facility];  Service: General;  Laterality: N/A;  . IR GASTROSTOMY TUBE MOD SED  12/27/2016  . IR GASTROSTOMY TUBE REMOVAL  03/24/2018  . IR PATIENT EVAL TECH 0-60 MINS  01/17/2017  . MASS EXCISION Right 11/16/2016   Procedure: EXCISION RIGHT NECK LIPOMA;  Surgeon: Rozetta Nunnery, MD;  Location: [facility];  Service: ENT;  Laterality: Right;  . MOHS SURGERY  01/01/2015   to top of head  . MOUTH SURGERY     teeth removed 4 teeth extracted 02/2010  . RADICAL NECK DISSECTION Right 07/25/2017   Procedure: RIGHT NECK DISSECTION;  Surgeon: Rozetta Nunnery, MD;  Location: [facility];  Service: ENT;  Laterality: Right;  . UMBILICAL HERNIA REPAIR  01/18/2018   w/mesh  . UMBILICAL HERNIA REPAIR N/A 01/18/2018  AICD (automatic cardioverter/defibrillator) present   . History of head and neck cancer 07/25/2017  . Long term (current) use of anticoagulants 06/22/2017  . 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 Thrush, oral 12/21/2016  . Weight loss, abnormal 12/21/2016  . 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 Umbilical hernia without obstruction and without gangrene   . Pressure injury of skin 11/14/2016  . Tongue mass: Per Ct neck 11/10/2016 11/12/2016  . SBO (small bowel obstruction) ([place]) 11/11/2016  . Encounter for therapeutic drug monitoring  10/25/2013  . Diabetic peripheral neuropathy ([place]) 10/14/2011  . Hypotension 08/20/2011  . Acute renal failure ([place]) 08/20/2011  . OSA (obstructive sleep apnea) 04/15/2011  . ICD-[employer] 03/30/2011  . Chronic diastolic CHF (congestive heart failure) ([place]) 03/30/2011  . PAF (paroxysmal atrial fibrillation) ([place]) 12/14/2010  . Nonischemic cardiomyopathy ([place]) 12/14/2010  . Type 2 diabetes mellitus ([place]) 12/14/2010  . Hyperlipidemia 12/14/2010  . Cardiomyopathy, nonischemic ([place]) 09/06/1996    Narda Bonds, PTA [place] 08/28/19 12:19 PM Phone: [phone] Fax: [place] [facility] [address] [place], [place], [address] Phone: [phone]   Fax:  [phone]  Name: Collin [name] MRN: [id number] Date of Birth: 1940/11/27  AICD (automatic cardioverter/defibrillator) present   . History of head and neck cancer 07/25/2017  . Long term (current) use of anticoagulants 06/22/2017  . 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 Thrush, oral 12/21/2016  . Weight loss, abnormal 12/21/2016  . 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 Umbilical hernia without obstruction and without gangrene   . Pressure injury of skin 11/14/2016  . Tongue mass: Per Ct neck 11/10/2016 11/12/2016  . SBO (small bowel obstruction) ([place]) 11/11/2016  . Encounter for therapeutic drug monitoring  10/25/2013  . Diabetic peripheral neuropathy ([place]) 10/14/2011  . Hypotension 08/20/2011  . Acute renal failure ([place]) 08/20/2011  . OSA (obstructive sleep apnea) 04/15/2011  . ICD-[employer] 03/30/2011  . Chronic diastolic CHF (congestive heart failure) ([place]) 03/30/2011  . PAF (paroxysmal atrial fibrillation) ([place]) 12/14/2010  . Nonischemic cardiomyopathy ([place]) 12/14/2010  . Type 2 diabetes mellitus ([place]) 12/14/2010  . Hyperlipidemia 12/14/2010  . Cardiomyopathy, nonischemic ([place]) 09/06/1996    Narda Bonds, PTA [place] 08/28/19 12:19 PM Phone: [phone] Fax: [facility] [address] [place], [place], [address] Phone: [phone]   Fax:  [phone]  Name: Collin [name] MRN: [id number] Date of Birth: 1940/11/27

## 2019-09-04 ENCOUNTER — Ambulatory Visit: Payer: Medicare HMO | Admitting: Physical Therapy

## 2019-09-04 ENCOUNTER — Other Ambulatory Visit: Payer: Self-pay

## 2019-09-04 DIAGNOSIS — R2689 Other abnormalities of gait and mobility: Secondary | ICD-10-CM

## 2019-09-04 DIAGNOSIS — R2681 Unsteadiness on feet: Secondary | ICD-10-CM | POA: Diagnosis not present

## 2019-09-04 DIAGNOSIS — M6281 Muscle weakness (generalized): Secondary | ICD-10-CM

## 2019-09-04 NOTE — Therapy (Signed)
[facility] [address] [place], [place], [address] Phone: [phone]   Fax:  [phone]  Physical Therapy Treatment  Patient Details  Name: Collin [name] MRN: [id number] Date of Birth: 1941/02/22 Referring Provider (PT): Dorris Carnes, MD   Encounter Date: 09/04/2019  PT End of Session - 09/04/19 1055    Visit Number  4    Number of Visits  9    Date for PT Re-Evaluation  09/24/19    Authorization Type  [employer] Medicare - 10th visit progress note needed    Authorization Time Period  08-21-19 - 10-22-19    PT Start Time  0932    PT Stop Time  1015    PT Time Calculation (min)  43 min    Equipment Utilized During Treatment  Gait belt    Activity Tolerance  Patient tolerated treatment well    Behavior During Therapy  [facility] for tasks assessed/performed       Past Medical History:  Diagnosis Date  . AICD (automatic cardioverter/defibrillator) present   . Atrial fibrillation ([place])   . Cancer (HCC)    tongue cancer  . Cardiomyopathy, nonischemic ([place]) 1998  . CHF (congestive heart failure) ([place])   . Claustrophobia   . Diabetes mellitus   . Dysrhythmia   . Heart disease   . History of echocardiogram    Echo 8/18:  EF 55-60, no RWMA, severe LAE, normal RVSF, mild RAE  . History of radiation therapy 12/07/16- 01/24/17   Base of Tongue 70 Gy in 35 fractions  . Hyperlipidemia   . Memory loss   . OSA (obstructive sleep apnea)   . Shingles   . Umbilical hernia   . 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 It sometimes shows as a shadow on scans.     Past Surgical History:  Procedure Laterality Date  . CARDIAC DEFIBRILLATOR PLACEMENT  2006  . CATARACT EXTRACTION Bilateral    01/07/2016. 01/22/2016  . CYST EXCISION     multiple drainage to a cyst in his neck.   Marland Kitchen DIRECT LARYNGOSCOPY N/A 11/16/2016   Procedure: DIRECT LARYNGOSCOPY AND BIOPSY;  Surgeon: Rozetta Nunnery, MD;  Location: [facility];  Service: ENT;  Laterality: N/A;  . EYE SURGERY    . HERNIA REPAIR  1950s  . IMPLANTABLE CARDIOVERTER DEFIBRILLATOR (ICD) GENERATOR CHANGE N/A 05/10/2014   Procedure: ICD GENERATOR CHANGE;  Surgeon: [name], MD;  Location: [facility] CATH LAB;  Service: Cardiovascular;  Laterality: N/A;  . INSERTION OF MESH N/A 01/18/2018   Procedure: INSERTION OF MESH;  Surgeon: Jovita Kussmaul, MD;  Location: [facility];  Service: General;  Laterality: N/A;  . IR GASTROSTOMY TUBE MOD SED  12/27/2016  . IR GASTROSTOMY TUBE REMOVAL  03/24/2018  . IR PATIENT EVAL TECH 0-60 MINS  01/17/2017  . MASS EXCISION Right 11/16/2016   Procedure: EXCISION RIGHT NECK LIPOMA;  Surgeon: Rozetta Nunnery, MD;  Location: [facility];  Service: ENT;  Laterality: Right;  . MOHS SURGERY  01/01/2015   to top of head  . MOUTH SURGERY     teeth removed 4 teeth extracted 02/2010  . RADICAL NECK DISSECTION Right 07/25/2017   Procedure: RIGHT NECK DISSECTION;  Surgeon: Rozetta Nunnery, MD;  Location: [facility];  Service: ENT;  Laterality: Right;  . UMBILICAL HERNIA REPAIR  01/18/2018   w/mesh  . UMBILICAL HERNIA REPAIR N/A 01/18/2018  [facility] [address] [place], [place], [address] Phone: [phone]   Fax:  [phone]  Physical Therapy Treatment  Patient Details  Name: Collin [name] MRN: [id number] Date of Birth: 1941/02/22 Referring Provider (PT): Dorris Carnes, MD   Encounter Date: 09/04/2019  PT End of Session - 09/04/19 1055    Visit Number  4    Number of Visits  9    Date for PT Re-Evaluation  09/24/19    Authorization Type  [employer] Medicare - 10th visit progress note needed    Authorization Time Period  08-21-19 - 10-22-19    PT Start Time  0932    PT Stop Time  1015    PT Time Calculation (min)  43 min    Equipment Utilized During Treatment  Gait belt    Activity Tolerance  Patient tolerated treatment well    Behavior During Therapy  [facility] for tasks assessed/performed       Past Medical History:  Diagnosis Date  . AICD (automatic cardioverter/defibrillator) present   . Atrial fibrillation ([place])   . Cancer (HCC)    tongue cancer  . Cardiomyopathy, nonischemic ([place]) 1998  . CHF (congestive heart failure) ([place])   . Claustrophobia   . Diabetes mellitus   . Dysrhythmia   . Heart disease   . History of echocardiogram    Echo 8/18:  EF 55-60, no RWMA, severe LAE, normal RVSF, mild RAE  . History of radiation therapy 12/07/16- 01/24/17   Base of Tongue 70 Gy in 35 fractions  . Hyperlipidemia   . Memory loss   . OSA (obstructive sleep apnea)   . Shingles   . Umbilical hernia   . 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 It sometimes shows as a shadow on scans.     Past Surgical History:  Procedure Laterality Date  . CARDIAC DEFIBRILLATOR PLACEMENT  2006  . CATARACT EXTRACTION Bilateral    01/07/2016. 01/22/2016  . CYST EXCISION     multiple drainage to a cyst in his neck.   Marland Kitchen DIRECT LARYNGOSCOPY N/A 11/16/2016   Procedure: DIRECT LARYNGOSCOPY AND BIOPSY;  Surgeon: Rozetta Nunnery, MD;  Location: [facility];  Service: ENT;  Laterality: N/A;  . EYE SURGERY    . HERNIA REPAIR  1950s  . IMPLANTABLE CARDIOVERTER DEFIBRILLATOR (ICD) GENERATOR CHANGE N/A 05/10/2014   Procedure: ICD GENERATOR CHANGE;  Surgeon: [name], MD;  Location: [facility] CATH LAB;  Service: Cardiovascular;  Laterality: N/A;  . INSERTION OF MESH N/A 01/18/2018   Procedure: INSERTION OF MESH;  Surgeon: Jovita Kussmaul, MD;  Location: [facility];  Service: General;  Laterality: N/A;  . IR GASTROSTOMY TUBE MOD SED  12/27/2016  . IR GASTROSTOMY TUBE REMOVAL  03/24/2018  . IR PATIENT EVAL TECH 0-60 MINS  01/17/2017  . MASS EXCISION Right 11/16/2016   Procedure: EXCISION RIGHT NECK LIPOMA;  Surgeon: Rozetta Nunnery, MD;  Location: [facility];  Service: ENT;  Laterality: Right;  . MOHS SURGERY  01/01/2015   to top of head  . MOUTH SURGERY     teeth removed 4 teeth extracted 02/2010  . RADICAL NECK DISSECTION Right 07/25/2017   Procedure: RIGHT NECK DISSECTION;  Surgeon: Rozetta Nunnery, MD;  Location: [facility];  Service: ENT;  Laterality: Right;  . UMBILICAL HERNIA REPAIR  01/18/2018   w/mesh  . UMBILICAL HERNIA REPAIR N/A 01/18/2018  [facility] [address] [place], [place], [address] Phone: [phone]   Fax:  [phone]  Physical Therapy Treatment  Patient Details  Name: Collin [name] MRN: [id number] Date of Birth: 1941/02/22 Referring Provider (PT): Dorris Carnes, MD   Encounter Date: 09/04/2019  PT End of Session - 09/04/19 1055    Visit Number  4    Number of Visits  9    Date for PT Re-Evaluation  09/24/19    Authorization Type  [employer] Medicare - 10th visit progress note needed    Authorization Time Period  08-21-19 - 10-22-19    PT Start Time  0932    PT Stop Time  1015    PT Time Calculation (min)  43 min    Equipment Utilized During Treatment  Gait belt    Activity Tolerance  Patient tolerated treatment well    Behavior During Therapy  [facility] for tasks assessed/performed       Past Medical History:  Diagnosis Date  . AICD (automatic cardioverter/defibrillator) present   . Atrial fibrillation ([place])   . Cancer (HCC)    tongue cancer  . Cardiomyopathy, nonischemic ([place]) 1998  . CHF (congestive heart failure) ([place])   . Claustrophobia   . Diabetes mellitus   . Dysrhythmia   . Heart disease   . History of echocardiogram    Echo 8/18:  EF 55-60, no RWMA, severe LAE, normal RVSF, mild RAE  . History of radiation therapy 12/07/16- 01/24/17   Base of Tongue 70 Gy in 35 fractions  . Hyperlipidemia   . Memory loss   . OSA (obstructive sleep apnea)   . Shingles   . Umbilical hernia   . 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 It sometimes shows as a shadow on scans.     Past Surgical History:  Procedure Laterality Date  . CARDIAC DEFIBRILLATOR PLACEMENT  2006  . CATARACT EXTRACTION Bilateral    01/07/2016. 01/22/2016  . CYST EXCISION     multiple drainage to a cyst in his neck.   Marland Kitchen DIRECT LARYNGOSCOPY N/A 11/16/2016   Procedure: DIRECT LARYNGOSCOPY AND BIOPSY;  Surgeon: Rozetta Nunnery, MD;  Location: [facility];  Service: ENT;  Laterality: N/A;  . EYE SURGERY    . HERNIA REPAIR  1950s  . IMPLANTABLE CARDIOVERTER DEFIBRILLATOR (ICD) GENERATOR CHANGE N/A 05/10/2014   Procedure: ICD GENERATOR CHANGE;  Surgeon: [name], MD;  Location: [facility] CATH LAB;  Service: Cardiovascular;  Laterality: N/A;  . INSERTION OF MESH N/A 01/18/2018   Procedure: INSERTION OF MESH;  Surgeon: Jovita Kussmaul, MD;  Location: [facility];  Service: General;  Laterality: N/A;  . IR GASTROSTOMY TUBE MOD SED  12/27/2016  . IR GASTROSTOMY TUBE REMOVAL  03/24/2018  . IR PATIENT EVAL TECH 0-60 MINS  01/17/2017  . MASS EXCISION Right 11/16/2016   Procedure: EXCISION RIGHT NECK LIPOMA;  Surgeon: Rozetta Nunnery, MD;  Location: [facility];  Service: ENT;  Laterality: Right;  . MOHS SURGERY  01/01/2015   to top of head  . MOUTH SURGERY     teeth removed 4 teeth extracted 02/2010  . RADICAL NECK DISSECTION Right 07/25/2017   Procedure: RIGHT NECK DISSECTION;  Surgeon: Rozetta Nunnery, MD;  Location: [facility];  Service: ENT;  Laterality: Right;  . UMBILICAL HERNIA REPAIR  01/18/2018   w/mesh  . UMBILICAL HERNIA REPAIR N/A 01/18/2018  kidney injury) ([place]) 11/12/2017  . Vein disorder   . Umbilical hernia   . Shingles   . History of radiation therapy   . History of echocardiogram   . Heart disease   . Dysrhythmia   . Claustrophobia   . CHF (congestive heart failure) ([place])   . Cancer ([place])   . Atrial fibrillation ([place])   . AICD (automatic cardioverter/defibrillator) present   . History of head and neck cancer 07/25/2017  . Long term (current) use of anticoagulants 06/22/2017  . 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 Thrush, oral 12/21/2016  . Weight loss, abnormal 12/21/2016  . Malignant neoplasm of base of tongue ([place]) 11/24/2016  . Umbilical hernia without obstruction and without gangrene   .  Pressure injury of skin 11/14/2016  . Tongue mass: Per Ct neck 11/10/2016 11/12/2016  . SBO (small bowel obstruction) ([place]) 11/11/2016  . Encounter for therapeutic drug monitoring 10/25/2013  . Diabetic peripheral neuropathy ([place]) 10/14/2011  . Hypotension 08/20/2011  . Acute renal failure ([place]) 08/20/2011  . OSA (obstructive sleep apnea) 04/15/2011  . ICD-[employer] 03/30/2011  . Chronic diastolic CHF (congestive heart failure) ([place]) 03/30/2011  . PAF (paroxysmal atrial fibrillation) ([place]) 12/14/2010  . Nonischemic cardiomyopathy ([place]) 12/14/2010  . Type 2 diabetes mellitus ([place]) 12/14/2010  . Hyperlipidemia 12/14/2010  . Cardiomyopathy, nonischemic ([place]) 09/06/1996    Narda Bonds, PTA [facility] 09/04/19 10:58 AM Phone: [phone] Fax: [facility] [address] [place], [place], [address] Phone: [phone]   Fax:  [phone]  Name: Collin [name] MRN: [id number] Date of Birth: 1940/10/14

## 2019-09-05 ENCOUNTER — Ambulatory Visit: Payer: Medicare HMO | Admitting: Physical Therapy

## 2019-09-05 DIAGNOSIS — R2689 Other abnormalities of gait and mobility: Secondary | ICD-10-CM | POA: Diagnosis not present

## 2019-09-05 DIAGNOSIS — M6281 Muscle weakness (generalized): Secondary | ICD-10-CM

## 2019-09-05 DIAGNOSIS — R2681 Unsteadiness on feet: Secondary | ICD-10-CM

## 2019-09-05 NOTE — Therapy (Signed)
Narda Bonds, [place] 09/05/19 10:42 AM Phone: [phone] Fax: [facility] [address] [place], [place], [address] Phone: [phone]   Fax:  [phone]  Name: Collin [name] MRN: [id number] Date  of Birth: [birth year]  [facility] [address] [place], [place], [address] Phone: [phone]   Fax:  [phone]  Physical Therapy Treatment  Patient Details  Name: Collin [name] MRN: [id number] Date of Birth: [birth year] Referring Provider (PT): Dorris Carnes, MD   Encounter Date: 09/05/2019  PT End of Session - 09/05/19 1036    Visit Number  5    Number of Visits  9    Date for PT Re-Evaluation  09/24/19    Authorization Type  [employer] Medicare - 10th visit progress note needed    Authorization Time Period  08-21-19 - 10-22-19    PT Start Time  0936    PT Stop Time  1016    PT Time Calculation (min)  40 min    Equipment Utilized During Treatment  Gait belt    Activity Tolerance  Patient tolerated treatment well    Behavior During Therapy  [facility] for tasks assessed/performed       Past Medical History:  Diagnosis Date  . AICD (automatic cardioverter/defibrillator) present   . Atrial fibrillation ([place])   . Cancer (HCC)    tongue cancer  . Cardiomyopathy, nonischemic ([place]) 1998  . CHF (congestive heart failure) ([place])   . Claustrophobia   . Diabetes mellitus   . Dysrhythmia   . Heart disease   . History of echocardiogram    Echo 8/18:  EF 55-60, no RWMA, severe LAE, normal RVSF, mild RAE  . History of radiation therapy 12/07/16- 01/24/17   Base of Tongue 70 Gy in 35 fractions  . Hyperlipidemia   . Memory loss   . OSA (obstructive sleep apnea)   . Shingles   . Umbilical hernia   . 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 It sometimes shows as a shadow on scans.     Past Surgical History:  Procedure Laterality Date  . CARDIAC DEFIBRILLATOR PLACEMENT  2006  . CATARACT EXTRACTION Bilateral    01/07/2016. 01/22/2016  . CYST EXCISION     multiple drainage to a cyst in his neck.   Marland Kitchen DIRECT LARYNGOSCOPY N/A 11/16/2016   Procedure: DIRECT LARYNGOSCOPY AND BIOPSY;  Surgeon: Rozetta Nunnery, MD;  Location: [facility];  Service: ENT;  Laterality: N/A;  . EYE SURGERY    . HERNIA REPAIR  1950s  . IMPLANTABLE CARDIOVERTER DEFIBRILLATOR (ICD) GENERATOR CHANGE N/A 05/10/2014   Procedure: ICD GENERATOR CHANGE;  Surgeon: [name], MD;  Location: [facility] CATH LAB;  Service: Cardiovascular;  Laterality: N/A;  . INSERTION OF MESH N/A 01/18/2018   Procedure: INSERTION OF MESH;  Surgeon: Jovita Kussmaul, MD;  Location: [facility];  Service: General;  Laterality: N/A;  . IR GASTROSTOMY TUBE MOD SED  12/27/2016  . IR GASTROSTOMY TUBE REMOVAL  03/24/2018  . IR PATIENT EVAL TECH 0-60 MINS  01/17/2017  . MASS EXCISION Right 11/16/2016   Procedure: EXCISION RIGHT NECK LIPOMA;  Surgeon: Rozetta Nunnery, MD;  Location: [facility];  Service: ENT;  Laterality: Right;  . MOHS SURGERY  01/01/2015   to top of head  . MOUTH SURGERY     teeth removed 4 teeth extracted 02/2010  . RADICAL NECK DISSECTION Right 07/25/2017   Procedure: RIGHT NECK DISSECTION;  Surgeon: Rozetta Nunnery, MD;  Location: [facility];  Service: ENT;  Laterality: Right;  . UMBILICAL HERNIA REPAIR  01/18/2018   w/mesh  . UMBILICAL HERNIA REPAIR N/A 01/18/2018  Narda Bonds, [place] 09/05/19 10:42 AM Phone: [phone] Fax: [facility] [address] [place], [place], [address] Phone: [phone]   Fax:  [phone]  Name: Collin [name] MRN: [id number] Date  of Birth: [birth year]  Narda Bonds, [place] 09/05/19 10:42 AM Phone: [phone] Fax: [facility] [address] [place], [place], [address] Phone: [phone]   Fax:  [phone]  Name: Collin [name] MRN: [id number] Date  of Birth: [birth year]

## 2019-09-05 NOTE — Patient Instructions (Signed)
Access Code: [email or web address]  URL: [email or web address]  Date: 09/05/2019  Prepared by: Nita Sells   Exercises  Standing March with Counter Support - 10 reps - 2x daily - 5x weekly  Standing Hip Abduction with Counter Support - 10 reps - 2x daily - 5x weekly  Standing Tandem Balance with Counter Support - 10 reps - 2x daily - 5x weekly  Standing Single Leg Stance with Counter Support - 10 reps - 2x daily - 5x weekly  Heel rises with counter support - 10 reps - 2x daily - 5x weekly  Side Stepping with Counter Support - 10 reps - 2x daily - 5x weekly  Standing Hip Flexion with Counter Support - 10 reps - 2x daily - 5x weekly  Standing Balance with Eyes Closed on Foam - 3 sets - 10 seconds hold - 2x daily - 7x weekly  Romberg Stance Eyes Closed on Foam Pad - 3 sets - 10 seconds hold - 2x daily - 7x weekly  Eyes closed Romberg Stance with Head Nods and Turns - 10 reps - 2x daily - 7x weekly  Long Sitting Ankle Plantar Flexion with Resistance - 10 reps - 2 sets - 2x daily - 7x weekly

## 2019-09-10 ENCOUNTER — Other Ambulatory Visit: Payer: Self-pay

## 2019-09-10 ENCOUNTER — Other Ambulatory Visit: Payer: Medicare HMO | Admitting: *Deleted

## 2019-09-10 DIAGNOSIS — I509 Heart failure, unspecified: Secondary | ICD-10-CM | POA: Diagnosis not present

## 2019-09-10 DIAGNOSIS — I480 Paroxysmal atrial fibrillation: Secondary | ICD-10-CM | POA: Diagnosis not present

## 2019-09-10 LAB — BASIC METABOLIC PANEL
BUN/Creatinine Ratio: 21 (ref 10–24)
BUN: 19 mg/dL (ref 8–27)
CO2: 29 mmol/L (ref 20–29)
Calcium: 9.2 mg/dL (ref 8.6–10.2)
Chloride: 102 mmol/L (ref 96–106)
Creatinine, Ser: 0.89 mg/dL (ref 0.76–1.27)
GFR calc Af Amer: 95 mL/min/{1.73_m2} (ref >59)
GFR calc non Af Amer: 82 mL/min/{1.73_m2} (ref >59)
Glucose: 92 mg/dL (ref 65–99)
Potassium: 4.4 mmol/L (ref 3.5–5.2)
Sodium: 141 mmol/L (ref 134–144)

## 2019-09-10 LAB — PRO B NATRIURETIC PEPTIDE: NT-Pro BNP: 1281 pg/mL — ABNORMAL HIGH (ref 0–486)

## 2019-09-11 ENCOUNTER — Encounter: Payer: Self-pay | Admitting: Physical Therapy

## 2019-09-11 ENCOUNTER — Ambulatory Visit: Payer: Medicare HMO | Attending: Internal Medicine | Admitting: Physical Therapy

## 2019-09-11 DIAGNOSIS — R2681 Unsteadiness on feet: Secondary | ICD-10-CM

## 2019-09-11 DIAGNOSIS — R2689 Other abnormalities of gait and mobility: Secondary | ICD-10-CM | POA: Diagnosis not present

## 2019-09-11 DIAGNOSIS — M6281 Muscle weakness (generalized): Secondary | ICD-10-CM | POA: Diagnosis not present

## 2019-09-11 NOTE — Therapy (Signed)
[facility] [address] [place], [place], [address] Phone: [phone]   Fax:  [phone]  Physical Therapy Treatment  Patient Details  Name: Collin [name] MRN: [id number] Date of Birth: 1941-09-02 Referring Provider (PT): Dorris Carnes, MD   Encounter Date: 09/11/2019  PT End of Session - 09/11/19 0807    Visit Number  6    Number of Visits  9    Date for PT Re-Evaluation  09/24/19    Authorization Type  [employer] Medicare - 10th visit progress note needed    Authorization Time Period  08-21-19 - 10-22-19    PT Start Time  0804    PT Stop Time  0845    PT Time Calculation (min)  41 min    Equipment Utilized During Treatment  Gait belt    Activity Tolerance  Patient tolerated treatment well    Behavior During Therapy  [facility] for tasks assessed/performed       Past Medical History:  Diagnosis Date  . AICD (automatic cardioverter/defibrillator) present   . Atrial fibrillation ([place])   . Cancer (HCC)    tongue cancer  . 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 CHF (congestive heart failure) ([place])   . Claustrophobia   . Diabetes mellitus   . Dysrhythmia   . Heart disease   . History of echocardiogram    Echo 8/18:  EF 55-60, no RWMA, severe LAE, normal RVSF, mild RAE  . History of radiation therapy 12/07/16- 01/24/17   Base of Tongue 70 Gy in 35 fractions  . Hyperlipidemia   . Memory loss   . OSA (obstructive sleep apnea)   . Shingles   . Umbilical hernia   . 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 It sometimes shows as a shadow on scans.     Past Surgical History:  Procedure Laterality Date  . CARDIAC DEFIBRILLATOR PLACEMENT  2006  . CATARACT EXTRACTION Bilateral    01/07/2016. 01/22/2016  . CYST EXCISION     multiple drainage to a cyst in his neck.   Marland Kitchen DIRECT LARYNGOSCOPY N/A 11/16/2016   Procedure: DIRECT LARYNGOSCOPY AND BIOPSY;  Surgeon: Rozetta Nunnery, MD;  Location: [facility];  Service: ENT;  Laterality: N/A;  . EYE SURGERY    . HERNIA REPAIR  1950s  . IMPLANTABLE CARDIOVERTER DEFIBRILLATOR (ICD) GENERATOR CHANGE N/A 05/10/2014   Procedure: ICD GENERATOR CHANGE;  Surgeon: [name], MD;  [facility];  Service: Cardiovascular;  Laterality: N/A;  . INSERTION OF MESH N/A 01/18/2018   Procedure: INSERTION OF MESH;  Surgeon: Jovita Kussmaul, MD;  Location: [facility];  Service: General;  Laterality: N/A;  . IR GASTROSTOMY TUBE MOD SED  12/27/2016  . IR GASTROSTOMY TUBE REMOVAL  03/24/2018  . IR PATIENT EVAL TECH 0-60 MINS  01/17/2017  . MASS EXCISION Right 11/16/2016   Procedure: EXCISION RIGHT NECK LIPOMA;  Surgeon: Rozetta Nunnery, MD;  Location: [facility];  Service: ENT;  Laterality: Right;  . MOHS SURGERY  01/01/2015   to top of head  . MOUTH SURGERY     teeth removed 4 teeth extracted 02/2010  . RADICAL NECK DISSECTION Right 07/25/2017   Procedure: RIGHT NECK DISSECTION;  Surgeon: Rozetta Nunnery, MD;  Location: [facility];  Service: ENT;  Laterality: Right;  . UMBILICAL HERNIA REPAIR  01/18/2018   w/mesh  . UMBILICAL HERNIA REPAIR N/A 01/18/2018  on feet  Muscle weakness (generalized)     Problem List Patient Active Problem List   Diagnosis Date Noted  . Malnutrition of moderate degree 11/18/2017  . Supratherapeutic INR 11/14/2017  . AKI (acute kidney injury) ([place]) 11/12/2017  . Vein disorder   . Umbilical hernia   . Shingles   . History of radiation therapy   . History of echocardiogram   . Heart disease   . Dysrhythmia   . Claustrophobia   . CHF (congestive heart failure) ([place])   . Cancer ([place])   . Atrial fibrillation ([place])   . AICD (automatic cardioverter/defibrillator) present    . History of head and neck cancer 07/25/2017  . Long term (current) use of anticoagulants 06/22/2017  . 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 Thrush, oral 12/21/2016  . Weight loss, abnormal 12/21/2016  . Malignant neoplasm of base of tongue ([place]) 11/24/2016  . Umbilical hernia without obstruction and without gangrene   . Pressure injury of skin 11/14/2016  . Tongue mass: Per Ct neck 11/10/2016 11/12/2016  . SBO (small bowel obstruction) ([place]) 11/11/2016  . Encounter for therapeutic drug monitoring 10/25/2013  . Diabetic peripheral neuropathy ([place]) 10/14/2011  . Hypotension 08/20/2011  . Acute renal failure ([place]) 08/20/2011  . OSA (obstructive sleep apnea) 04/15/2011  . ICD-[employer] 03/30/2011  . Chronic diastolic CHF (congestive heart failure) ([place]) 03/30/2011  . PAF (paroxysmal atrial fibrillation) ([place]) 12/14/2010  . Nonischemic cardiomyopathy ([place]) 12/14/2010  . Type 2 diabetes mellitus ([place]) 12/14/2010  . Hyperlipidemia 12/14/2010  . Cardiomyopathy, nonischemic ([place]) 09/06/1996    Willow Ora, PTA, [facility] [address], [place], [place] [address] [phone] 09/11/19, 1:02 PM   Name: Collin [name] MRN: [id number] Date of Birth: 01-07-1941  [facility] [address] [place], [place], [address] Phone: [phone]   Fax:  [phone]  Physical Therapy Treatment  Patient Details  Name: Collin [name] MRN: [id number] Date of Birth: 1941-09-02 Referring Provider (PT): Dorris Carnes, MD   Encounter Date: 09/11/2019  PT End of Session - 09/11/19 0807    Visit Number  6    Number of Visits  9    Date for PT Re-Evaluation  09/24/19    Authorization Type  A[employer]na Medicare - 10th visit progress note needed    Authorization Time Period  08-21-19 - 10-22-19    PT Start Time  0804    PT Stop Time  0845    PT Time Calculation (min)  41 min    Equipment Utilized During Treatment  Gait belt    Activity Tolerance  Patient tolerated treatment well    Behavior During Therapy  [facility] for tasks assessed/performed       Past Medical History:  Diagnosis Date  . AICD (automatic cardioverter/defibrillator) present   . Atrial fibrillation ([place])   . Cancer (HCC)    tongue cancer  . 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 CHF (congestive heart failure) ([place])   . Claustrophobia   . Diabetes mellitus   . Dysrhythmia   . Heart disease   . History of echocardiogram    Echo 8/18:  EF 55-60, no RWMA, severe LAE, normal RVSF, mild RAE  . History of radiation therapy 12/07/16- 01/24/17   Base of Tongue 70 Gy in 35 fractions  . Hyperlipidemia   . Memory loss   . OSA (obstructive sleep apnea)   . Shingles   . Umbilical hernia   . 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 It sometimes shows as a shadow on scans.     Past Surgical History:  Procedure Laterality Date  . CARDIAC DEFIBRILLATOR PLACEMENT  2006  . CATARACT EXTRACTION Bilateral    01/07/2016. 01/22/2016  . CYST EXCISION     multiple drainage to a cyst in his neck.   Marland Kitchen DIRECT LARYNGOSCOPY N/A 11/16/2016   Procedure: DIRECT LARYNGOSCOPY AND BIOPSY;  Surgeon: Rozetta Nunnery, MD;  Location: [facility];  Service: ENT;  Laterality: N/A;  . EYE SURGERY    . HERNIA REPAIR  1950s  . IMPLANTABLE CARDIOVERTER DEFIBRILLATOR (ICD) GENERATOR CHANGE N/A 05/10/2014   Procedure: ICD GENERATOR CHANGE;  Surgeon: [name], MD;  [facility];  Service: Cardiovascular;  Laterality: N/A;  . INSERTION OF MESH N/A 01/18/2018   Procedure: INSERTION OF MESH;  Surgeon: Jovita Kussmaul, MD;  Location: [facility];  Service: General;  Laterality: N/A;  . IR GASTROSTOMY TUBE MOD SED  12/27/2016  . IR GASTROSTOMY TUBE REMOVAL  03/24/2018  . IR PATIENT EVAL TECH 0-60 MINS  01/17/2017  . MASS EXCISION Right 11/16/2016   Procedure: EXCISION RIGHT NECK LIPOMA;  Surgeon: Rozetta Nunnery, MD;  Location: [facility];  Service: ENT;  Laterality: Right;  . MOHS SURGERY  01/01/2015   to top of head  . MOUTH SURGERY     teeth removed 4 teeth extracted 02/2010  . RADICAL NECK DISSECTION Right 07/25/2017   Procedure: RIGHT NECK DISSECTION;  Surgeon: Rozetta Nunnery, MD;  Location: [facility];  Service: ENT;  Laterality: Right;  . UMBILICAL HERNIA REPAIR  01/18/2018   w/mesh  . UMBILICAL HERNIA REPAIR N/A 01/18/2018  [facility] [address] [place], [place], [address] Phone: [phone]   Fax:  [phone]  Physical Therapy Treatment  Patient Details  Name: Collin [name] MRN: [id number] Date of Birth: 1941-09-02 Referring Provider (PT): Dorris Carnes, MD   Encounter Date: 09/11/2019  PT End of Session - 09/11/19 0807    Visit Number  6    Number of Visits  9    Date for PT Re-Evaluation  09/24/19    Authorization Type  Aetna Medicare - 10th visit progress note needed    Authorization Time Period  08-21-19 - 10-22-19    PT Start Time  0804    PT Stop Time  0845    PT Time Calculation (min)  41 min    Equipment Utilized During Treatment  Gait belt    Activity Tolerance  Patient tolerated treatment well    Behavior During Therapy  [facility] for tasks assessed/performed       Past Medical History:  Diagnosis Date  . AICD (automatic cardioverter/defibrillator) present   . Atrial fibrillation ([place])   . Cancer (HCC)    tongue cancer  . 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 CHF (congestive heart failure) ([place])   . Claustrophobia   . Diabetes mellitus   . Dysrhythmia   . Heart disease   . History of echocardiogram    Echo 8/18:  EF 55-60, no RWMA, severe LAE, normal RVSF, mild RAE  . History of radiation therapy 12/07/16- 01/24/17   Base of Tongue 70 Gy in 35 fractions  . Hyperlipidemia   . Memory loss   . OSA (obstructive sleep apnea)   . Shingles   . Umbilical hernia   . 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 It sometimes shows as a shadow on scans.     Past Surgical History:  Procedure Laterality Date  . CARDIAC DEFIBRILLATOR PLACEMENT  2006  . CATARACT EXTRACTION Bilateral    01/07/2016. 01/22/2016  . CYST EXCISION     multiple drainage to a cyst in his neck.   Marland Kitchen DIRECT LARYNGOSCOPY N/A 11/16/2016   Procedure: DIRECT LARYNGOSCOPY AND BIOPSY;  Surgeon: Rozetta Nunnery, MD;  Location: [facility];  Service: ENT;  Laterality: N/A;  . EYE SURGERY    . HERNIA REPAIR  1950s  . IMPLANTABLE CARDIOVERTER DEFIBRILLATOR (ICD) GENERATOR CHANGE N/A 05/10/2014   Procedure: ICD GENERATOR CHANGE;  Surgeon: [name], MD;  [facility];  Service: Cardiovascular;  Laterality: N/A;  . INSERTION OF MESH N/A 01/18/2018   Procedure: INSERTION OF MESH;  Surgeon: Jovita Kussmaul, MD;  Location: [facility];  Service: General;  Laterality: N/A;  . IR GASTROSTOMY TUBE MOD SED  12/27/2016  . IR GASTROSTOMY TUBE REMOVAL  03/24/2018  . IR PATIENT EVAL TECH 0-60 MINS  01/17/2017  . MASS EXCISION Right 11/16/2016   Procedure: EXCISION RIGHT NECK LIPOMA;  Surgeon: Rozetta Nunnery, MD;  Location: [facility];  Service: ENT;  Laterality: Right;  . MOHS SURGERY  01/01/2015   to top of head  . MOUTH SURGERY     teeth removed 4 teeth extracted 02/2010  . RADICAL NECK DISSECTION Right 07/25/2017   Procedure: RIGHT NECK DISSECTION;  Surgeon: Rozetta Nunnery, MD;  Location: [facility];  Service: ENT;  Laterality: Right;  . UMBILICAL HERNIA REPAIR  01/18/2018   w/mesh  . UMBILICAL HERNIA REPAIR N/A 01/18/2018

## 2019-09-13 ENCOUNTER — Other Ambulatory Visit: Payer: Self-pay

## 2019-09-13 ENCOUNTER — Ambulatory Visit: Payer: Medicare HMO | Admitting: Physical Therapy

## 2019-09-13 ENCOUNTER — Encounter: Payer: Self-pay | Admitting: Physical Therapy

## 2019-09-13 DIAGNOSIS — R2689 Other abnormalities of gait and mobility: Secondary | ICD-10-CM

## 2019-09-13 DIAGNOSIS — M6281 Muscle weakness (generalized): Secondary | ICD-10-CM | POA: Diagnosis not present

## 2019-09-13 DIAGNOSIS — R2681 Unsteadiness on feet: Secondary | ICD-10-CM

## 2019-09-14 ENCOUNTER — Telehealth: Payer: Self-pay | Admitting: *Deleted

## 2019-09-14 DIAGNOSIS — I509 Heart failure, unspecified: Secondary | ICD-10-CM

## 2019-09-14 MED ORDER — TORSEMIDE 20 MG PO TABS: ORAL_TABLET | ORAL | 3 refills | Status: DC

## 2019-09-14 NOTE — Telephone Encounter (Signed)
-----   Message from Dorris Carnes V, MD sent at 09/10/2019 11:53 PM EST ----- Kidney function and electrolytes are normal Fluid is up but better than 3 wks ago    Pt could take 1 1/2 torsemide every other day and 20 on other days Check BMET and BNP in 1 month

## 2019-09-14 NOTE — Telephone Encounter (Signed)
Spoke with patient's wife. Pt to increase torsemide to 20 mg alternating with 30 mg every other day and will come back for labs on 10/12/19. (BMET, BNP).

## 2019-09-14 NOTE — Therapy (Signed)
[facility] [address] [place], [place], [address] Phone: [phone]   Fax:  [phone]  Physical Therapy Treatment  Patient Details  Name: Collin [name] MRN: [id number] Date of Birth: [birth year] Referring Provider (PT): Dorris Carnes, MD   Encounter Date: 09/13/2019  PT End of Session - 09/13/19 1455    Visit Number  7    Number of Visits  9    Date for PT Re-Evaluation  09/24/19    Authorization Type  A[employer]na Medicare - 10th visit progress note needed    Authorization Time Period  08-21-19 - 10-22-19    PT Start Time  1448    PT Stop Time  1530    PT Time Calculation (min)  42 min    Equipment Utilized During Treatment  Gait belt    Activity Tolerance  Patient tolerated treatment well    Behavior During Therapy  [facility] for tasks assessed/performed       Past Medical History:  Diagnosis Date  . AICD (automatic cardioverter/defibrillator) present   . Atrial fibrillation ([place])   . Cancer (HCC)    tongue cancer  . 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 CHF (congestive heart failure) ([place])   . Claustrophobia   . Diabetes mellitus   . Dysrhythmia   . Heart disease   . History of echocardiogram    Echo 8/18:  EF 55-60, no RWMA, severe LAE, normal RVSF, mild RAE  . History of radiation therapy 12/07/16- 01/24/17   Base of Tongue 70 Gy in 35 fractions  . Hyperlipidemia   . Memory loss   . OSA (obstructive sleep apnea)   . Shingles   . Umbilical hernia   . 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 It sometimes shows as a shadow on scans.     Past Surgical History:  Procedure Laterality Date  . CARDIAC DEFIBRILLATOR PLACEMENT  2006  . CATARACT EXTRACTION Bilateral    01/07/2016. 01/22/2016  . CYST EXCISION     multiple drainage to a cyst in his neck.   Marland Kitchen DIRECT LARYNGOSCOPY N/A 11/16/2016   Procedure: DIRECT LARYNGOSCOPY AND BIOPSY;  Surgeon: Rozetta Nunnery, MD;  Location: [facility];  Service: ENT;  Laterality: N/A;  . EYE SURGERY    . HERNIA REPAIR  1950s  . IMPLANTABLE CARDIOVERTER DEFIBRILLATOR (ICD) GENERATOR CHANGE N/A 05/10/2014   Procedure: ICD GENERATOR CHANGE;  Surgeon: [name], MD;  [facility];  Service: Cardiovascular;  Laterality: N/A;  . INSERTION OF MESH N/A 01/18/2018   Procedure: INSERTION OF MESH;  Surgeon: Jovita Kussmaul, MD;  Location: [facility];  Service: General;  Laterality: N/A;  . IR GASTROSTOMY TUBE MOD SED  12/27/2016  . IR GASTROSTOMY TUBE REMOVAL  03/24/2018  . IR PATIENT EVAL TECH 0-60 MINS  01/17/2017  . MASS EXCISION Right 11/16/2016   Procedure: EXCISION RIGHT NECK LIPOMA;  Surgeon: Rozetta Nunnery, MD;  Location: [facility];  Service: ENT;  Laterality: Right;  . MOHS SURGERY  01/01/2015   to top of head  . MOUTH SURGERY     teeth removed 4 teeth extracted 02/2010  . RADICAL NECK DISSECTION Right 07/25/2017   Procedure: RIGHT NECK DISSECTION;  Surgeon: Rozetta Nunnery, MD;  Location: [facility];  Service: ENT;  Laterality: Right;  . UMBILICAL HERNIA REPAIR  01/18/2018   w/mesh  . UMBILICAL HERNIA REPAIR N/A 01/18/2018  Atrial fibrillation ([place])   . AICD (automatic cardioverter/defibrillator) present   . History of head and neck cancer 07/25/2017  . Long term (current) use of anticoagulants 06/22/2017  . 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 Thrush, oral 12/21/2016  . Weight loss, abnormal 12/21/2016  . Malignant neoplasm of base of tongue ([place]) 11/24/2016  . Umbilical hernia without obstruction and without gangrene   . Pressure injury of skin 11/14/2016  . Tongue mass: Per Ct neck 11/10/2016  11/12/2016  . SBO (small bowel obstruction) ([place]) 11/11/2016  . Encounter for therapeutic drug monitoring 10/25/2013  . Diabetic peripheral neuropathy ([place]) 10/14/2011  . Hypotension 08/20/2011  . Acute renal failure ([place]) 08/20/2011  . OSA (obstructive sleep apnea) 04/15/2011  . ICD-[employer] 03/30/2011  . Chronic diastolic CHF (congestive heart failure) ([place]) 03/30/2011  . PAF (paroxysmal atrial fibrillation) ([place]) 12/14/2010  . Nonischemic cardiomyopathy ([place]) 12/14/2010  . Type 2 diabetes mellitus ([place]) 12/14/2010  . Hyperlipidemia 12/14/2010  . Cardiomyopathy, nonischemic ([place]) 09/06/1996    Willow Ora, PTA, [facility] [address], [place], [place] [address] [phone] 09/14/19, 8:21 PM   Name: Collin [name] MRN: [id number] Date of Birth: Apr 09, 1941  [facility] [address] [place], [place], [address] Phone: [phone]   Fax:  [phone]  Physical Therapy Treatment  Patient Details  Name: Collin [name] MRN: [id number] Date of Birth: [birth year] Referring Provider (PT): Dorris Carnes, MD   Encounter Date: 09/13/2019  PT End of Session - 09/13/19 1455    Visit Number  7    Number of Visits  9    Date for PT Re-Evaluation  09/24/19    Authorization Type  Aetna Medicare - 10th visit progress note needed    Authorization Time Period  08-21-19 - 10-22-19    PT Start Time  1448    PT Stop Time  1530    PT Time Calculation (min)  42 min    Equipment Utilized During Treatment  Gait belt    Activity Tolerance  Patient tolerated treatment well    Behavior During Therapy  [facility] for tasks assessed/performed       Past Medical History:  Diagnosis Date  . AICD (automatic cardioverter/defibrillator) present   . Atrial fibrillation ([place])   . Cancer (HCC)    tongue cancer  . 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 CHF (congestive heart failure) ([place])   . Claustrophobia   . Diabetes mellitus   . Dysrhythmia   . Heart disease   . History of echocardiogram    Echo 8/18:  EF 55-60, no RWMA, severe LAE, normal RVSF, mild RAE  . History of radiation therapy 12/07/16- 01/24/17   Base of Tongue 70 Gy in 35 fractions  . Hyperlipidemia   . Memory loss   . OSA (obstructive sleep apnea)   . Shingles   . Umbilical hernia   . 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 It sometimes shows as a shadow on scans.     Past Surgical History:  Procedure Laterality Date  . CARDIAC DEFIBRILLATOR PLACEMENT  2006  . CATARACT EXTRACTION Bilateral    01/07/2016. 01/22/2016  . CYST EXCISION     multiple drainage to a cyst in his neck.   Marland Kitchen DIRECT LARYNGOSCOPY N/A 11/16/2016   Procedure: DIRECT LARYNGOSCOPY AND BIOPSY;  Surgeon: Rozetta Nunnery, MD;  Location: [facility];  Service: ENT;  Laterality: N/A;  . EYE SURGERY    . HERNIA REPAIR  1950s  . IMPLANTABLE CARDIOVERTER DEFIBRILLATOR (ICD) GENERATOR CHANGE N/A 05/10/2014   Procedure: ICD GENERATOR CHANGE;  Surgeon: [name], MD;  Location: [facility];  Service: Cardiovascular;  Laterality: N/A;  . INSERTION OF MESH N/A 01/18/2018   Procedure: INSERTION OF MESH;  Surgeon: Jovita Kussmaul, MD;  Location: [facility];  Service: General;  Laterality: N/A;  . IR GASTROSTOMY TUBE MOD SED  12/27/2016  . IR GASTROSTOMY TUBE REMOVAL  03/24/2018  . IR PATIENT EVAL TECH 0-60 MINS  01/17/2017  . MASS EXCISION Right 11/16/2016   Procedure: EXCISION RIGHT NECK LIPOMA;  Surgeon: Rozetta Nunnery, MD;  Location: [facility];  Service: ENT;  Laterality: Right;  . MOHS SURGERY  01/01/2015   to top of head  . MOUTH SURGERY     teeth removed 4 teeth extracted 02/2010  . RADICAL NECK DISSECTION Right 07/25/2017   Procedure: RIGHT NECK DISSECTION;  Surgeon: Rozetta Nunnery, MD;  Location: [facility];  Service: ENT;  Laterality: Right;  . UMBILICAL HERNIA REPAIR  01/18/2018   w/mesh  . UMBILICAL HERNIA REPAIR N/A 01/18/2018  [facility] [address] [place], [place], [address] Phone: [phone]   Fax:  [phone]  Physical Therapy Treatment  Patient Details  Name: Collin [name] MRN: [id number] Date of Birth: [birth year] Referring Provider (PT): Dorris Carnes, MD   Encounter Date: 09/13/2019  PT End of Session - 09/13/19 1455    Visit Number  7    Number of Visits  9    Date for PT Re-Evaluation  09/24/19    Authorization Type  Aetna Medicare - 10th visit progress note needed    Authorization Time Period  08-21-19 - 10-22-19    PT Start Time  1448    PT Stop Time  1530    PT Time Calculation (min)  42 min    Equipment Utilized During Treatment  Gait belt    Activity Tolerance  Patient tolerated treatment well    Behavior During Therapy  [facility] for tasks assessed/performed       Past Medical History:  Diagnosis Date  . AICD (automatic cardioverter/defibrillator) present   . Atrial fibrillation ([place])   . Cancer (HCC)    tongue cancer  . 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 CHF (congestive heart failure) ([place])   . Claustrophobia   . Diabetes mellitus   . Dysrhythmia   . Heart disease   . History of echocardiogram    Echo 8/18:  EF 55-60, no RWMA, severe LAE, normal RVSF, mild RAE  . History of radiation therapy 12/07/16- 01/24/17   Base of Tongue 70 Gy in 35 fractions  . Hyperlipidemia   . Memory loss   . OSA (obstructive sleep apnea)   . Shingles   . Umbilical hernia   . 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 It sometimes shows as a shadow on scans.     Past Surgical History:  Procedure Laterality Date  . CARDIAC DEFIBRILLATOR PLACEMENT  2006  . CATARACT EXTRACTION Bilateral    01/07/2016. 01/22/2016  . CYST EXCISION     multiple drainage to a cyst in his neck.   Marland Kitchen DIRECT LARYNGOSCOPY N/A 11/16/2016   Procedure: DIRECT LARYNGOSCOPY AND BIOPSY;  Surgeon: Rozetta Nunnery, MD;  Location: [facility];  Service: ENT;  Laterality: N/A;  . EYE SURGERY    . HERNIA REPAIR  1950s  . IMPLANTABLE CARDIOVERTER DEFIBRILLATOR (ICD) GENERATOR CHANGE N/A 05/10/2014   Procedure: ICD GENERATOR CHANGE;  Surgeon: [name], MD;  Location: [facility] C[facility] LAB;  Service: Cardiovascular;  Laterality: N/A;  . INSERTION OF MESH N/A 01/18/2018   Procedure: INSERTION OF MESH;  Surgeon: Jovita Kussmaul, MD;  Location: [facility];  Service: General;  Laterality: N/A;  . IR GASTROSTOMY TUBE MOD SED  12/27/2016  . IR GASTROSTOMY TUBE REMOVAL  03/24/2018  . IR PATIENT EVAL TECH 0-60 MINS  01/17/2017  . MASS EXCISION Right 11/16/2016   Procedure: EXCISION RIGHT NECK LIPOMA;  Surgeon: Rozetta Nunnery, MD;  Location: [facility];  Service: ENT;  Laterality: Right;  . MOHS SURGERY  01/01/2015   to top of head  . MOUTH SURGERY     teeth removed 4 teeth extracted 02/2010  . RADICAL NECK DISSECTION Right 07/25/2017   Procedure: RIGHT NECK DISSECTION;  Surgeon: Rozetta Nunnery, MD;  Location: [facility];  Service: ENT;  Laterality: Right;  . UMBILICAL HERNIA REPAIR  01/18/2018   w/mesh  . UMBILICAL HERNIA REPAIR N/A 01/18/2018

## 2019-09-17 ENCOUNTER — Other Ambulatory Visit: Payer: Self-pay

## 2019-09-17 ENCOUNTER — Encounter: Payer: Self-pay | Admitting: Physical Therapy

## 2019-09-17 ENCOUNTER — Other Ambulatory Visit: Payer: Self-pay | Admitting: Family Medicine

## 2019-09-17 ENCOUNTER — Ambulatory Visit: Payer: Medicare HMO | Admitting: Physical Therapy

## 2019-09-17 ENCOUNTER — Other Ambulatory Visit: Payer: Self-pay | Admitting: Internal Medicine

## 2019-09-17 DIAGNOSIS — M6281 Muscle weakness (generalized): Secondary | ICD-10-CM | POA: Diagnosis not present

## 2019-09-17 DIAGNOSIS — R2681 Unsteadiness on feet: Secondary | ICD-10-CM

## 2019-09-17 DIAGNOSIS — R2689 Other abnormalities of gait and mobility: Secondary | ICD-10-CM | POA: Diagnosis not present

## 2019-09-17 NOTE — Patient Instructions (Addendum)
Strengthening: Hip Extension - Resisted    With tubing around right ankle, face anchor and pull leg straight back. Repeat __10__ times per set. Do _1___ sets per session. Do __1__ sessions per day.  http://orth.exer.us/636   Copyright  VHI. All rights reserved.    Strengthening: Hip Abduction - Resisted    With tubing around right leg, other side toward anchor, extend leg out from side. Repeat _10___ times per set. Do __1__ sets per session. Do __1__ sessions per day.  http://orth.exer.us/634       Strengthening: Hip Flexion - Resisted    With tubing around left ankle, anchor behind, bring leg forward, keeping knee straight. Repeat _10___ times per set. Do _1___ sets per session. Do ___1_ sessions per day.  ALSO LIFT UP LEG with knee bent - step on band with opposite foot  http://orth.exer.us/638     Functional Quadriceps: Sit to Stand    Sit on edge of chair, feet flat on floor. Stand upright, extending knees fully. Repeat __10__ times per set. Do _1___ sets per session. Do __1__ sessions per day.  http://orth.exer.us/734   Copyright  VHI. All rights reserved.

## 2019-09-17 NOTE — Therapy (Addendum)
[facility] [address] [place], [place], [address] Phone: [phone]   Fax:  [phone]  Physical Therapy Treatment  Patient Details  Name: Collin [name] MRN: [id number] Date of Birth: September 06, 1941 Referring Provider (PT): Dietrich Pates, MD   Encounter Date: 09/17/2019  PT End of Session - 09/17/19 2002    Visit Number  8    Number of Visits  9    Date for PT Re-Evaluation  09/24/19    Authorization Type  [employer] - 10th visit progress note needed    Authorization Time Period  08-21-19 - 10-22-19    PT Start Time  1105    PT Stop Time  1155    PT Time Calculation (min)  50 min    Equipment Utilized During Treatment  Gait belt    Activity Tolerance  Patient tolerated treatment well    Behavior During Therapy  [facility] for tasks assessed/performed       Past Medical History:  Diagnosis Date  . AICD (automatic cardioverter/defibrillator) present   . Atrial fibrillation (HCC)   . Cancer (HCC)    tongue cancer  . Cardiomyopathy, nonischemic (HCC) 1998  . CHF (congestive heart failure) (HCC)   . Claustrophobia   . Diabetes mellitus   . Dysrhythmia   . Heart disease   . History of echocardiogram    Echo 8/18:  EF 55-60, no RWMA, severe LAE, normal RVSF, mild RAE  . History of radiation therapy 12/07/16- 01/24/17   Base of Tongue 70 Gy in 35 fractions  . Hyperlipidemia   . Memory loss   . OSA (obstructive sleep apnea)   . Shingles   . Umbilical hernia   . 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 It sometimes shows as a shadow on scans.     Past Surgical History:  Procedure Laterality Date  . CARDIAC DEFIBRILLATOR PLACEMENT  2006  . CATARACT EXTRACTION Bilateral    01/07/2016. 01/22/2016  . CYST EXCISION     multiple drainage to a cyst in his neck.   Marland Kitchen DIRECT LARYNGOSCOPY N/A 11/16/2016   Procedure: DIRECT LARYNGOSCOPY AND BIOPSY;  Surgeon: [name], MD;  Location: [facility] OR;  Service: ENT;  Laterality: N/A;  . EYE SURGERY    . HERNIA REPAIR  1950s  . IMPLANTABLE CARDIOVERTER DEFIBRILLATOR (ICD) GENERATOR CHANGE N/A 05/10/2014   Procedure: ICD GENERATOR CHANGE;  Surgeon: [name], MD;  Location: [facility] CATH LAB;  Service: Cardiovascular;  Laterality: N/A;  . INSERTION OF MESH N/A 01/18/2018   Procedure: INSERTION OF MESH;  Surgeon: [name], MD;  Location: [facility] OR;  Service: General;  Laterality: N/A;  . IR GASTROSTOMY TUBE MOD SED  12/27/2016  . IR GASTROSTOMY TUBE REMOVAL  03/24/2018  . IR PATIENT EVAL TECH 0-60 MINS  01/17/2017  . MASS EXCISION Right 11/16/2016   Procedure: EXCISION RIGHT NECK LIPOMA;  Surgeon: [name], MD;  Location: [facility] OR;  Service: ENT;  Laterality: Right;  . MOHS SURGERY  01/01/2015   to top of head  . MOUTH SURGERY     teeth removed 4 teeth extracted 02/2010  . RADICAL NECK DISSECTION Right 07/25/2017   Procedure: RIGHT NECK DISSECTION;  Surgeon: [name], MD;  Location: [facility] OR;  Service: ENT;  Laterality: Right;  . UMBILICAL HERNIA REPAIR  01/18/2018   w/mesh  . UMBILICAL HERNIA REPAIR N/A 01/18/2018  Procedure: UMBILICAL HERNIA REPAIR WITH MESH;  Surgeon: [name], MD;  Location: [facility] OR;  Service: General;  Laterality: N/A;    There were no vitals filed for this visit.  Subjective Assessment - 09/17/19 1953    Subjective  Pt continues to c/o joint pain in hands and knees; wife informs him he needs to discuss this with his doctor.  Pt states he is doing exercises at home for balance.    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  Yes    Pain Score  4     Pain Location  --   generalized body and joint pain   Pain Orientation  Right;Left     Pain Descriptors / Indicators  Aching;Discomfort;Sore;Nagging    Pain Type  Chronic pain    Pain Onset  More than a month ago         [facility] PT Assessment - 09/17/19 0001      Standardized Balance Assessment   Standardized Balance Assessment  Berg Balance Test                   [facility] Adult PT Treatment/Exercise - 09/17/19 1126      Transfers   Transfers  Sit to Stand;Stand to Sit    Sit to Stand  5: Supervision    Sit to Stand Details (indicate cue type and reason)  from mat - no UE support used    Number of Reps  Other reps (comment)   5 reps - added this ex. to HEP     Standardized Balance Assessment   Standardized Balance Assessment  Timed Up and Go Test      Berg Balance Test   Sit to Stand  Able to stand without using hands and stabilize independently    Standing Unsupported  Able to stand safely 2 minutes    Sitting with Back Unsupported but Feet Supported on Floor or Stool  Able to sit safely and securely 2 minutes    Stand to Sit  Sits safely with minimal use of hands    Transfers  Able to transfer safely, minor use of hands    Standing Unsupported with Eyes Closed  Able to stand 10 seconds safely    Standing Ubsupported with Feet Together  Able to place feet together independently and stand for 1 minute with supervision    From Standing, Reach Forward with Outstretched Arm  Can reach confidently >25 cm (10")    From Standing Position, Pick up Object from Floor  Able to pick up shoe, needs supervision    From Standing Position, Turn to Look Behind Over each Shoulder  Looks behind from both sides and weight shifts well    Turn 360 Degrees  Able to turn 360 degrees safely but slowly   L=4.75  R=5.25   Standing Unsupported, Alternately Place Feet on Step/Stool  Able to complete >2 steps/needs minimal assist    Standing Unsupported, One Foot in Front  Able to plae foot ahead of the other independently and hold 30 seconds    Standing on One Leg  Able to lift leg  independently and hold equal to or more than 3 seconds   R= 3.12   L= 1.6  Total Score  46      Timed Up and Go Test   TUG  Normal TUG    Normal TUG (seconds)  25.68   16.97 secs 2nd rep      Exercises   Exercises  Knee/Hip      Knee/Hip Exercises: Standing   Hip Flexion  Stengthening;Right;Left;10 reps;Knee bent;Knee straight;2 sets   1 set with knee extended; 1 set w/ knee flexed; red t-band    Hip Abduction  Stengthening;Right;Left;1 set;10 reps   with red theraband   Hip Extension  Stengthening;Right;Left;1 set;10 reps   with red t-band            PT Education - 09/17/19 2001    Education Details  added sit to stand and hip strengthening exs with red theraband to  HEP    Person(s) Educated  Patient    Methods  Explanation;Demonstration;Handout    Comprehension  Verbalized understanding;Returned demonstration       PT Short Term Goals - 09/17/19 2003      PT SHORT TERM GOAL #1   Title  same as LTG's (4 weeks ELOS)        PT Long Term Goals - 09/17/19 2003      PT LONG TERM GOAL #1   Title  Pt will increase Berg balance test score by at least 5 points for reduced fall risk.    Baseline  score decreased from 47/56 on 08-23-19 to 46/56 as tested today on 09-17-19    Time  4    Period  Weeks    Status  Not Met      PT LONG TERM GOAL #2   Title  Improve TUG score from 19.35 secs to </= 14.5 secs with no device to demo increased functional mobility with reduced fall risk.    Baseline  16.97 secs with no device on 09-17-19    Time  4    Period  Weeks    Status  Partially Met      PT LONG TERM GOAL #3   Title  Increase gait velocity from 1.88 ft/sec to >/= 2.1 ft/sec with no device for incr. gait efficiency.    Baseline  17.42 secs = 1.88 ft/sec    Time  4    Period  Weeks    Status  New      PT LONG TERM GOAL #4   Title  Pt will be independent in HEP for balance exercises.    Baseline  met 09-17-19    Time  4    Period  Weeks    Status  Achieved             Plan - 09/17/19 1114    Clinical Impression Statement  Pt has not met LTG #1 as Berg score decreased from 47/56 to 46/56;  LTG #2 partially met as TUG score has improved since initial eval but not to stated goal;  LTG #4 met with pt independent in HEP.  Progress has been limited - probable D/C next session as pt states he will continue to perform HEP at home.    PT Frequency  2x / week    PT Duration  4 weeks    PT Treatment/Interventions  ADLs/Self Care Home Management;Gait training;Stair training;Therapeutic activities;Therapeutic exercise;Balance training;Neuromuscular re-education;Patient/family education    PT Next Visit Plan  Check LTG #3 - pt is to be deciding if he wishes to continue with PT or D/C next session on 09-19-19;  check  hip strengthening exercises with red tband added to HEP on 09-17-19    PT Home Exercise Plan  [employer] [email or web address]    Consulted and Agree with Plan of Care  Patient    Family Member Consulted  wife [name]       Patient will benefit from skilled therapeutic intervention in order to improve the following deficits and impairments:  Abnormal gait, Decreased balance, Other (comment), Decreased cognition, Decreased strength, Postural dysfunction  Visit Diagnosis: Other abnormalities of gait and mobility  Unsteadiness on feet  Muscle weakness (generalized)     Problem List Patient Active Problem List   Diagnosis Date Noted  . Malnutrition of moderate degree 11/18/2017  . Supratherapeutic INR 11/14/2017  . AKI (acute kidney injury) (HCC) 11/12/2017  . Vein disorder   . Umbilical hernia   . Shingles   . History of radiation therapy   . History of echocardiogram   . Heart disease   . Dysrhythmia   . Claustrophobia   . CHF (congestive heart failure) (HCC)   . Cancer (HCC)   . Atrial fibrillation (HCC)   . AICD (automatic cardioverter/defibrillator) present   . History of head and neck cancer 07/25/2017  . Long term (current) use of  anticoagulants 06/22/2017  . 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 Thrush, oral 12/21/2016  . Weight loss, abnormal 12/21/2016  . 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 Umbilical hernia without obstruction and without gangrene   . Pressure injury of skin 11/14/2016  . Tongue mass: Per Ct neck 11/10/2016 11/12/2016  . SBO (small bowel obstruction) (HCC) 11/11/2016  . Encounter for therapeutic drug monitoring 10/25/2013  . Diabetic peripheral neuropathy (HCC) 10/14/2011  . Hypotension 08/20/2011  . Acute renal failure (HCC) 08/20/2011  . OSA (obstructive sleep apnea) 04/15/2011  . ICD-[employer] 03/30/2011  . Chronic diastolic CHF (congestive heart failure) (HCC) 03/30/2011  . PAF (paroxysmal atrial fibrillation) (HCC) 12/14/2010  . Nonischemic cardiomyopathy (HCC) 12/14/2010  . Type 2 diabetes mellitus (HCC) 12/14/2010  . Hyperlipidemia 12/14/2010  . Cardiomyopathy, nonischemic (HCC) 09/06/1996    Karna Abed, Donavan Burnet, PT 09/17/2019, 8:42 PM   [facility] [address] [place], [place], [address] Phone: [phone]   Fax:  [phone]  Name: Collin [name] MRN: [id number] Date of Birth: [birth year]

## 2019-09-18 ENCOUNTER — Ambulatory Visit: Payer: Self-pay

## 2019-09-18 ENCOUNTER — Ambulatory Visit (INDEPENDENT_AMBULATORY_CARE_PROVIDER_SITE_OTHER): Payer: Medicare HMO | Admitting: *Deleted

## 2019-09-18 DIAGNOSIS — I509 Heart failure, unspecified: Secondary | ICD-10-CM

## 2019-09-18 LAB — CUP PACEART REMOTE DEVICE CHECK
Battery Remaining Longevity: 132 mo
Battery Remaining Percentage: 100 %
Brady Statistic RV Percent Paced: 17 %
Date Time Interrogation Session: 20210112080500
HighPow Impedance: 40 Ohm
Implantable Lead Implant Date: 20060209
Implantable Lead Location: 753860
Implantable Lead Model: 158
Implantable Lead Serial Number: 156422
Implantable Pulse Generator Implant Date: 20150904
Lead Channel Impedance Value: 373 Ohm
Lead Channel Pacing Threshold Amplitude: 1 V
Lead Channel Pacing Threshold Pulse Width: 0.4 ms
Lead Channel Setting Pacing Amplitude: 2.4 V
Lead Channel Setting Pacing Pulse Width: 0.4 ms
Lead Channel Setting Sensing Sensitivity: 0.5 mV
Pulse Gen Serial Number: 191056

## 2019-09-18 NOTE — Telephone Encounter (Signed)
Incoming call from Patient wife , reporting that Patient , reports numbness sensation. In both hands .  Reports that the sensation is constant.  Denies cardiac  Sx.   Does report normal aches and pain.  Reviewed protocol with wife of Patient .  Recommend Patient be evaluated at Urgent Care or ED.  Patient  Wife. Stated that she did  Not feel it was that emergent. Related that it was.  Husband has been complaining  Since 2 months ago.  Wife states that she would try to get Patient to Urgent care.         Reason for Disposition . [1] Numbness (i.e., loss of sensation) of the face, arm / hand, or leg / foot on one side of the body AND [2] sudden onset AND [3] present now  Answer Assessment - Initial Assessment Questions 1. SYMPTOM: "What is the main symptom you are concerned about?" (e.g., weakness, numbness)     numb 2. ONSET: "When did this start?" (minutes, hours, days; while sleeping)      A couple of months ago 3. LAST NORMAL: "When was the last time you were normal (no symptoms)?"     *No Answer* 2 months 4. PATTERN "Does this come and go, or has it been constant since it started?"  "Is it present now?"    constant 5. CARDIAC SYMPTOMS: "Have you had any of the following symptoms: chest pain, difficulty breathing, palpitations?"    denies 6. NEUROLOGIC SYMPTOMS: "Have you had any of the following symptoms: headache, dizziness, vision loss, double vision, changes in speech, unsteady on your feet?"     denies 7. OTHER SYMPTOMS: "Do you have any other symptoms?"    Denies,  Normal aches and pains 8. PREGNANCY: "Is there any chance you are pregnant?" "When was your last menstrual period?"    Na  Protocols used: NEUROLOGIC DEFICIT-A-AH

## 2019-09-18 NOTE — Telephone Encounter (Signed)
Offer follow-up here tomorrow for 30-minute evaluation assuming he does not have any Covid symptoms.  Sounds like his symptoms have been more chronic over the past couple of months

## 2019-09-18 NOTE — Telephone Encounter (Signed)
Please see message. °

## 2019-09-18 NOTE — Telephone Encounter (Signed)
Called patient and spoke to wife. Scheduled an appointment at 10am tomorrow. Wife verbalized an understanding.

## 2019-09-18 NOTE — Telephone Encounter (Signed)
Message Routed to PCP CMA 

## 2019-09-19 ENCOUNTER — Encounter: Payer: Self-pay | Admitting: Family Medicine

## 2019-09-19 ENCOUNTER — Other Ambulatory Visit: Payer: Self-pay

## 2019-09-19 ENCOUNTER — Ambulatory Visit (INDEPENDENT_AMBULATORY_CARE_PROVIDER_SITE_OTHER): Payer: Medicare HMO | Admitting: Family Medicine

## 2019-09-19 ENCOUNTER — Encounter: Payer: Self-pay | Admitting: Physical Therapy

## 2019-09-19 ENCOUNTER — Ambulatory Visit: Payer: Medicare HMO | Admitting: Physical Therapy

## 2019-09-19 VITALS — BP 128/64 | HR 71 | Temp 97.7°F | Ht 73.0 in | Wt 216.8 lb

## 2019-09-19 DIAGNOSIS — M19041 Primary osteoarthritis, right hand: Secondary | ICD-10-CM | POA: Diagnosis not present

## 2019-09-19 DIAGNOSIS — M6281 Muscle weakness (generalized): Secondary | ICD-10-CM

## 2019-09-19 DIAGNOSIS — R2689 Other abnormalities of gait and mobility: Secondary | ICD-10-CM

## 2019-09-19 DIAGNOSIS — R2681 Unsteadiness on feet: Secondary | ICD-10-CM | POA: Diagnosis not present

## 2019-09-19 DIAGNOSIS — G5603 Carpal tunnel syndrome, bilateral upper limbs: Secondary | ICD-10-CM

## 2019-09-19 DIAGNOSIS — M19042 Primary osteoarthritis, left hand: Secondary | ICD-10-CM

## 2019-09-19 DIAGNOSIS — M6530 Trigger finger, unspecified finger: Secondary | ICD-10-CM | POA: Diagnosis not present

## 2019-09-19 NOTE — Progress Notes (Signed)
Subjective:     Patient ID: Collin [name], male   DOB: Aug 25, 1941, 79 y.o.   MRN: [id number]  HPI   With multiple complaints.  He had called yesterday and initial impression was that he was having numbness in his arms, legs, and face.  He was advised initially go to the ER.  On further questioning though he has had symptoms for over couple months.  He actually denies any numbness involving the face or lower extremities.  He is complaining of bilateral hand numbness in a distribution involving thumb, index, and middle fingers bilaterally.  He states this fall he did a lot of raking of leaves and seemed to start about then.  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  He denies any neck pain.  He is complaining of multiple joint pains including several digits of the hand.  He also has frequent trigger finger issues with his right thumb right index and left index fingers.  He has not noted any associated weakness in the hands or upper extremities.  He has history of hyperglycemia and recent A1c 5.8%.  Recent B12 level 2019 normal  Past Medical History:  Diagnosis Date  . AICD (automatic cardioverter/defibrillator) present   . Atrial fibrillation (HCC)   . Cancer (HCC)    tongue cancer  . Cardiomyopathy, nonischemic (HCC) 1998  . CHF (congestive heart failure) (HCC)   . Claustrophobia   . Diabetes mellitus   . Dysrhythmia   . Heart disease   . History of echocardiogram    Echo 8/18:  EF 55-60, no RWMA, severe LAE, normal RVSF, mild RAE  . History of radiation therapy 12/07/16- 01/24/17   Base of Tongue 70 Gy in 35 fractions  . Hyperlipidemia   . Memory loss   . OSA (obstructive sleep apnea)   . Shingles   . Umbilical hernia   . 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 It sometimes shows as a shadow on scans.    Past Surgical History:  Procedure Laterality Date  . CARDIAC DEFIBRILLATOR PLACEMENT  2006  . CATARACT  EXTRACTION Bilateral    01/07/2016. 01/22/2016  . CYST EXCISION     multiple drainage to a cyst in his neck.   Marland Kitchen DIRECT LARYNGOSCOPY N/A 11/16/2016   Procedure: DIRECT LARYNGOSCOPY AND BIOPSY;  Surgeon: [name], MD;  Location: [facility] OR;  Service: ENT;  Laterality: N/A;  . EYE SURGERY    . HERNIA REPAIR  1950s  . IMPLANTABLE CARDIOVERTER DEFIBRILLATOR (ICD) GENERATOR CHANGE N/A 05/10/2014   Procedure: ICD GENERATOR CHANGE;  Surgeon: [name], MD;  Location: [facility] CATH LAB;  Service: Cardiovascular;  Laterality: N/A;  . INSERTION OF MESH N/A 01/18/2018   Procedure: INSERTION OF MESH;  Surgeon: [name], MD;  Location: [facility] OR;  Service: General;  Laterality: N/A;  . IR GASTROSTOMY TUBE MOD SED  12/27/2016  . IR GASTROSTOMY TUBE REMOVAL  03/24/2018  . IR PATIENT EVAL TECH 0-60 MINS  01/17/2017  . MASS EXCISION Right 11/16/2016   Procedure: EXCISION RIGHT NECK LIPOMA;  Surgeon: [name], MD;  Location: [facility] OR;  Service: ENT;  Laterality: Right;  . MOHS SURGERY  01/01/2015   to top of head  . MOUTH SURGERY     teeth removed 4 teeth extracted 02/2010  . RADICAL NECK DISSECTION Right 07/25/2017   Procedure: RIGHT NECK DISSECTION;  Surgeon: Ezzard Standing,  Kristine Garbe, MD;  Location: [facility] OR;  Service: ENT;  Laterality: Right;  . UMBILICAL HERNIA REPAIR  01/18/2018   w/mesh  . UMBILICAL HERNIA REPAIR N/A 01/18/2018   Procedure: UMBILICAL HERNIA REPAIR WITH MESH;  Surgeon: [name], MD;  Location: [facility] OR;  Service: General;  Laterality: N/A;    reports that he has never smoked. He has never used smokeless tobacco. He reports current alcohol use. He reports that he does not use drugs. family history includes Heart disease in his mother; Hypertension in his father; Lupus in his mother; Stroke in his father; Sudden death in his mother. Allergies  Allergen Reactions  . Niaspan [Niacin Er] Other (See Comments)    FLUSHED     Review of Systems  Constitutional: Negative for chills  and fatigue.  Eyes: Negative for visual disturbance.  Respiratory: Negative for cough, chest tightness and shortness of breath.   Cardiovascular: Negative for chest pain, palpitations and leg swelling.  Neurological: Positive for numbness. Negative for dizziness, syncope, weakness, light-headedness and headaches.       Objective:   Physical Exam Vitals reviewed.  Constitutional:      Appearance: Normal appearance.  Cardiovascular:     Rate and Rhythm: Normal rate and regular rhythm.  Pulmonary:     Effort: Pulmonary effort is normal.     Breath sounds: Normal breath sounds.  Musculoskeletal:     Comments: He has trigger finger involving the right thumb, right index, and left index fingers.  He has slightly tender nodules in those digits at the base of the finger.  He does have some obvious thenar atrophy involving the right hand and unsure of chronicity.  Neurological:     Mental Status: He is alert.     Comments: He has good strength upper extremities.  He does have impairment with monofilament testing right and left hands involving the first through third digits and half of the fourth consistent with median nerve distribution.  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  If not improving at that time consider referral to hand surgeon  -Discussed possible steroid injections of most bothersome digits for trigger finger he will observe at this point consider at follow-up  [name] MD [facility]

## 2019-09-19 NOTE — Therapy (Signed)
[facility] [address] [place], [place], [address] Phone: [phone]   Fax:  [phone]  Physical Therapy Treatment  Patient Details  Name: Collin [name] MRN: [id number] Date of Birth: 1940/11/06 Referring Provider (PT): Dorris Carnes, MD   Encounter Date: 09/19/2019  PT End of Session - 09/19/19 1322    Visit Number  9    Number of Visits  9    Date for PT Re-Evaluation  09/24/19    Authorization Type  [employer] Medicare - 10th visit progress note needed    Authorization Time Period  08-21-19 - 10-22-19    PT Start Time  6834    PT Stop Time  1400    PT Time Calculation (min)  43 min    Equipment Utilized During Treatment  Gait belt    Activity Tolerance  Patient tolerated treatment well    Behavior During Therapy  [facility] for tasks assessed/performed       Past Medical History:  Diagnosis Date  . AICD (automatic cardioverter/defibrillator) present   . Atrial fibrillation ([place])   . Cancer (HCC)    tongue cancer  . 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 CHF (congestive heart failure) ([place])   . Claustrophobia   . Diabetes mellitus   . Dysrhythmia   . Heart disease   . History of echocardiogram    Echo 8/18:  EF 55-60, no RWMA, severe LAE, normal RVSF, mild RAE  . History of radiation therapy 12/07/16- 01/24/17   Base of Tongue 70 Gy in 35 fractions  . Hyperlipidemia   . Memory loss   . OSA (obstructive sleep apnea)   . Shingles   . Umbilical hernia   . 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 It sometimes shows as a shadow on scans.     Past Surgical History:  Procedure Laterality Date  . CARDIAC DEFIBRILLATOR PLACEMENT  2006  . CATARACT EXTRACTION Bilateral    01/07/2016. 01/22/2016  . CYST EXCISION     multiple drainage to a cyst in his neck.   Marland Kitchen DIRECT LARYNGOSCOPY N/A 11/16/2016   Procedure: DIRECT LARYNGOSCOPY AND BIOPSY;  Surgeon: Rozetta Nunnery, MD;  Location: [facility];  Service: ENT;  Laterality: N/A;  . EYE SURGERY    . HERNIA REPAIR  1950s  . IMPLANTABLE CARDIOVERTER DEFIBRILLATOR (ICD) GENERATOR CHANGE N/A 05/10/2014   Procedure: ICD GENERATOR CHANGE;  Surgeon: [name], MD;  Location: [facility] CATH LAB;  Service: Cardiovascular;  Laterality: N/A;  . INSERTION OF MESH N/A 01/18/2018   Procedure: INSERTION OF MESH;  Surgeon: Jovita Kussmaul, MD;  Location: [facility];  Service: General;  Laterality: N/A;  . IR GASTROSTOMY TUBE MOD SED  12/27/2016  . IR GASTROSTOMY TUBE REMOVAL  03/24/2018  . IR PATIENT EVAL TECH 0-60 MINS  01/17/2017  . MASS EXCISION Right 11/16/2016   Procedure: EXCISION RIGHT NECK LIPOMA;  Surgeon: Rozetta Nunnery, MD;  Location: [facility];  Service: ENT;  Laterality: Right;  . MOHS SURGERY  01/01/2015   to top of head  . MOUTH SURGERY     teeth removed 4 teeth extracted 02/2010  . RADICAL NECK DISSECTION Right 07/25/2017   Procedure: RIGHT NECK DISSECTION;  Surgeon: Rozetta Nunnery, MD;  Location: [place] [facility];  Service: ENT;  Laterality: Right;  . UMBILICAL HERNIA REPAIR  01/18/2018   w/mesh  . UMBILICAL HERNIA REPAIR N/A 01/18/2018  [facility] [address] [place], [place], [address] Phone: [phone]   Fax:  [phone]  Physical Therapy Treatment  Patient Details  Name: Collin [name] MRN: [id number] Date of Birth: 1940/11/06 Referring Provider (PT): Dorris Carnes, MD   Encounter Date: 09/19/2019  PT End of Session - 09/19/19 1322    Visit Number  9    Number of Visits  9    Date for PT Re-Evaluation  09/24/19    Authorization Type  [employer] Medicare - 10th visit progress note needed    Authorization Time Period  08-21-19 - 10-22-19    PT Start Time  6834    PT Stop Time  1400    PT Time Calculation (min)  43 min    Equipment Utilized During Treatment  Gait belt    Activity Tolerance  Patient tolerated treatment well    Behavior During Therapy  [facility] for tasks assessed/performed       Past Medical History:  Diagnosis Date  . AICD (automatic cardioverter/defibrillator) present   . Atrial fibrillation ([place])   . Cancer (HCC)    tongue cancer  . 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 CHF (congestive heart failure) ([place])   . Claustrophobia   . Diabetes mellitus   . Dysrhythmia   . Heart disease   . History of echocardiogram    Echo 8/18:  EF 55-60, no RWMA, severe LAE, normal RVSF, mild RAE  . History of radiation therapy 12/07/16- 01/24/17   Base of Tongue 70 Gy in 35 fractions  . Hyperlipidemia   . Memory loss   . OSA (obstructive sleep apnea)   . Shingles   . Umbilical hernia   . 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 It sometimes shows as a shadow on scans.     Past Surgical History:  Procedure Laterality Date  . CARDIAC DEFIBRILLATOR PLACEMENT  2006  . CATARACT EXTRACTION Bilateral    01/07/2016. 01/22/2016  . CYST EXCISION     multiple drainage to a cyst in his neck.   Marland Kitchen DIRECT LARYNGOSCOPY N/A 11/16/2016   Procedure: DIRECT LARYNGOSCOPY AND BIOPSY;  Surgeon: Rozetta Nunnery, MD;  Location: [facility];  Service: ENT;  Laterality: N/A;  . EYE SURGERY    . HERNIA REPAIR  1950s  . IMPLANTABLE CARDIOVERTER DEFIBRILLATOR (ICD) GENERATOR CHANGE N/A 05/10/2014   Procedure: ICD GENERATOR CHANGE;  Surgeon: [name], MD;  Location: [facility] CATH LAB;  Service: Cardiovascular;  Laterality: N/A;  . INSERTION OF MESH N/A 01/18/2018   Procedure: INSERTION OF MESH;  Surgeon: Jovita Kussmaul, MD;  Location: [facility];  Service: General;  Laterality: N/A;  . IR GASTROSTOMY TUBE MOD SED  12/27/2016  . IR GASTROSTOMY TUBE REMOVAL  03/24/2018  . IR PATIENT EVAL TECH 0-60 MINS  01/17/2017  . MASS EXCISION Right 11/16/2016   Procedure: EXCISION RIGHT NECK LIPOMA;  Surgeon: Rozetta Nunnery, MD;  Location: [facility];  Service: ENT;  Laterality: Right;  . MOHS SURGERY  01/01/2015   to top of head  . MOUTH SURGERY     teeth removed 4 teeth extracted 02/2010  . RADICAL NECK DISSECTION Right 07/25/2017   Procedure: RIGHT NECK DISSECTION;  Surgeon: Rozetta Nunnery, MD;  Location: [place];  Service: ENT;  Laterality: Right;  . UMBILICAL HERNIA REPAIR  01/18/2018   w/mesh  . UMBILICAL HERNIA REPAIR N/A 01/18/2018  [facility] [address] [place], [place], [address] Phone: [phone]   Fax:  [phone]  Physical Therapy Treatment  Patient Details  Name: Collin [name] MRN: [id number] Date of Birth: 1940/11/06 Referring Provider (PT): Dorris Carnes, MD   Encounter Date: 09/19/2019  PT End of Session - 09/19/19 1322    Visit Number  9    Number of Visits  9    Date for PT Re-Evaluation  09/24/19    Authorization Type  [employer] Medicare - 10th visit progress note needed    Authorization Time Period  08-21-19 - 10-22-19    PT Start Time  6834    PT Stop Time  1400    PT Time Calculation (min)  43 min    Equipment Utilized During Treatment  Gait belt    Activity Tolerance  Patient tolerated treatment well    Behavior During Therapy  [facility] for tasks assessed/performed       Past Medical History:  Diagnosis Date  . AICD (automatic cardioverter/defibrillator) present   . Atrial fibrillation ([place])   . Cancer (HCC)    tongue cancer  . 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 CHF (congestive heart failure) ([place])   . Claustrophobia   . Diabetes mellitus   . Dysrhythmia   . Heart disease   . History of echocardiogram    Echo 8/18:  EF 55-60, no RWMA, severe LAE, normal RVSF, mild RAE  . History of radiation therapy 12/07/16- 01/24/17   Base of Tongue 70 Gy in 35 fractions  . Hyperlipidemia   . Memory loss   . OSA (obstructive sleep apnea)   . Shingles   . Umbilical hernia   . 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 It sometimes shows as a shadow on scans.     Past Surgical History:  Procedure Laterality Date  . CARDIAC DEFIBRILLATOR PLACEMENT  2006  . CATARACT EXTRACTION Bilateral    01/07/2016. 01/22/2016  . CYST EXCISION     multiple drainage to a cyst in his neck.   Marland Kitchen DIRECT LARYNGOSCOPY N/A 11/16/2016   Procedure: DIRECT LARYNGOSCOPY AND BIOPSY;  Surgeon: Rozetta Nunnery, MD;  Location: [facility];  Service: ENT;  Laterality: N/A;  . EYE SURGERY    . HERNIA REPAIR  1950s  . IMPLANTABLE CARDIOVERTER DEFIBRILLATOR (ICD) GENERATOR CHANGE N/A 05/10/2014   Procedure: ICD GENERATOR CHANGE;  Surgeon: [name], MD;  Location: [facility] CATH LAB;  Service: Cardiovascular;  Laterality: N/A;  . INSERTION OF MESH N/A 01/18/2018   Procedure: INSERTION OF MESH;  Surgeon: Jovita Kussmaul, MD;  Location: [facility];  Service: General;  Laterality: N/A;  . IR GASTROSTOMY TUBE MOD SED  12/27/2016  . IR GASTROSTOMY TUBE REMOVAL  03/24/2018  . IR PATIENT EVAL TECH 0-60 MINS  01/17/2017  . MASS EXCISION Right 11/16/2016   Procedure: EXCISION RIGHT NECK LIPOMA;  Surgeon: Rozetta Nunnery, MD;  Location: [facility];  Service: ENT;  Laterality: Right;  . MOHS SURGERY  01/01/2015   to top of head  . MOUTH SURGERY     teeth removed 4 teeth extracted 02/2010  . RADICAL NECK DISSECTION Right 07/25/2017   Procedure: RIGHT NECK DISSECTION;  Surgeon: Rozetta Nunnery, MD;  Location: [place];  Service: ENT;  Laterality: Right;  . UMBILICAL HERNIA REPAIR  01/18/2018   w/mesh  . UMBILICAL HERNIA REPAIR N/A 01/18/2018  Patient Active Problem List   Diagnosis Date Noted  . Malnutrition of moderate degree 11/18/2017  . Supratherapeutic INR 11/14/2017  . AKI (acute kidney injury) ([place]) 11/12/2017  . Vein disorder   . Umbilical hernia   . Shingles   . History of radiation therapy   . History of echocardiogram   . Heart disease   . Dysrhythmia   . Claustrophobia   . CHF (congestive heart failure) ([place])   . Cancer ([name])   . Atrial fibrillation ([place])   . AICD (automatic cardioverter/defibrillator) present   . History of head and neck cancer 07/25/2017  . Long term (current) use of anticoagulants 06/22/2017  . 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 Thrush, oral 12/21/2016  . Weight loss, abnormal 12/21/2016  . Malignant neoplasm of base of tongue  ([place]) 11/24/2016  . Umbilical hernia without obstruction and without gangrene   . Pressure injury of skin 11/14/2016  . Tongue mass: Per Ct neck 11/10/2016 11/12/2016  . SBO (small bowel obstruction) ([place]) 11/11/2016  . Encounter for therapeutic drug monitoring 10/25/2013  . Diabetic peripheral neuropathy ([place]) 10/14/2011  . Hypotension 08/20/2011  . Acute renal failure ([place]) 08/20/2011  . OSA (obstructive sleep apnea) 04/15/2011  . ICD-[employer] 03/30/2011  . Chronic diastolic CHF (congestive heart failure) ([place]) 03/30/2011  . PAF (paroxysmal atrial fibrillation) ([place]) 12/14/2010  . Nonischemic cardiomyopathy ([place]) 12/14/2010  . Type 2 diabetes mellitus ([place]) 12/14/2010  . Hyperlipidemia 12/14/2010  . Cardiomyopathy, nonischemic ([place]) 09/06/1996    Willow Ora, PTA, [facility] [address] [place], [place] [address] [phone] 09/22/19, 12:08 AM    Name: Collin [name] MRN: [id number] Date of Birth: September 22, 1940

## 2019-09-19 NOTE — Patient Instructions (Signed)
Carpal Tunnel Syndrome  Carpal tunnel syndrome is a condition that causes pain in your hand and arm. The carpal tunnel is a narrow area located on the palm side of your wrist. Repeated wrist motion or certain diseases may cause swelling within the tunnel. This swelling pinches the main nerve in the wrist (median nerve). What are the causes? This condition may be caused by:  Repeated wrist motions.  Wrist injuries.  Arthritis.  A cyst or tumor in the carpal tunnel.  Fluid buildup during pregnancy. Sometimes the cause of this condition is not known. What increases the risk? The following factors may make you more likely to develop this condition:  Having a job, such as being a butcher or a cashier, that requires you to repeatedly move your wrist in the same motion.  Being a woman.  Having certain conditions, such as: ? Diabetes. ? Obesity. ? An underactive thyroid (hypothyroidism). ? Kidney failure. What are the signs or symptoms? Symptoms of this condition include:  A tingling feeling in your fingers, especially in your thumb, index, and middle fingers.  Tingling or numbness in your hand.  An aching feeling in your entire arm, especially when your wrist and elbow are bent for a long time.  Wrist pain that goes up your arm to your shoulder.  Pain that goes down into your palm or fingers.  A weak feeling in your hands. You may have trouble grabbing and holding items. Your symptoms may feel worse during the night. How is this diagnosed? This condition is diagnosed with a medical history and physical exam. You may also have tests, including:  Electromyogram (EMG). This test measures electrical signals sent by your nerves into the muscles.  Nerve conduction study. This test measures how well electrical signals pass through your nerves.  Imaging tests, such as X-rays, ultrasound, and MRI. These tests check for possible causes of your condition. How is this treated? This  condition may be treated with:  Lifestyle changes. It is important to stop or change the activity that caused your condition.  Doing exercise and activities to strengthen your muscles and bones (physical therapy).  Learning how to use your hand again after diagnosis (occupational therapy).  Medicines for pain and inflammation. This may include medicine that is injected into your wrist.  A wrist splint.  Surgery. Follow these instructions at home: If you have a splint:  Wear the splint as told by your health care provider. Remove it only as told by your health care provider.  Loosen the splint if your fingers tingle, become numb, or turn cold and blue.  Keep the splint clean.  If the splint is not waterproof: ? Do not let it get wet. ? Cover it with a watertight covering when you take a bath or shower. Managing pain, stiffness, and swelling   If directed, put ice on the painful area: ? If you have a removable splint, remove it as told by your health care provider. ? Put ice in a plastic bag. ? Place a towel between your skin and the bag. ? Leave the ice on for 20 minutes, 2-3 times per day. General instructions  Take over-the-counter and prescription medicines only as told by your health care provider.  Rest your wrist from any activity that may be causing your pain. If your condition is work related, talk with your employer about changes that can be made, such as getting a wrist pad to use while typing.  Do any exercises as told   by your health care provider, physical therapist, or occupational therapist.  Keep all follow-up visits as told by your health care provider. This is important. Contact a health care provider if:  You have new symptoms.  Your pain is not controlled with medicines.  Your symptoms get worse. Get help right away if:  You have severe numbness or tingling in your wrist or hand. Summary  Carpal tunnel syndrome is a condition that causes pain in  your hand and arm.  It is usually caused by repeated wrist motions.  Lifestyle changes and medicines are used to treat carpal tunnel syndrome. Surgery may be recommended.  Follow your health care provider's instructions about wearing a splint, resting from activity, keeping follow-up visits, and calling for help. This information is not intended to replace advice given to you by your health care provider. Make sure you discuss any questions you have with your health care provider. Document Revised: 12/30/2017 Document Reviewed: 12/30/2017 Elsevier Patient Education  [place] THE WRIST SPLINTS AND WEAR NIGHTLY.   IF NOT IMPROVING IN 3-4 WEEKS LET ME KNOW AND WE CAN REFER TO TO HAND SURGEON.

## 2019-09-22 NOTE — Patient Instructions (Signed)
Access Code: [email or web address]  URL: [email or web address]  Date: 09/19/2019  Prepared by: Willow Ora   Exercises Long Sitting Ankle Plantar Flexion with Resistance - 10 reps - 1 sets - 1x daily - 7x weekly Standing Hip Extension with Resistance at Ankles and Counter Support - 10 reps - 1 sets - 1x daily - 5x weekly Standing Hip Abduction with Resistance at Ankles and Counter Support - 10 reps - 1 sets - 1x daily - 5x weekly Standing Hip Flexion with Resistance Loop - 10 reps - 1 sets - 1x daily - 5x weekly Heel rises with counter support - 10 reps - 5 hold - 1x daily - 5x weekly Walking March - 3 reps - 1 sets - 1x daily - 5x weekly Standing Tandem Balance with Counter Support - 3 reps - 15 hold - 1x daily - 5x weekly Side Stepping with Counter Support - 3 reps - 1x daily - 5x weekly Standing Single Leg Stance with Counter Support - 3 reps - 15 hold - 1x daily - 5x weekly Standing Balance with Eyes Closed on Foam - 3 sets - 10 seconds hold - 2x daily - 7x weekly Romberg Stance Eyes Closed on Foam Pad - 3 sets - 10 seconds hold - 2x daily - 7x weekly Eyes closed Romberg Stance with Head Nods and Turns - 10 reps - 2x daily - 7x weekly

## 2019-09-28 ENCOUNTER — Other Ambulatory Visit: Payer: Self-pay

## 2019-10-01 ENCOUNTER — Ambulatory Visit (INDEPENDENT_AMBULATORY_CARE_PROVIDER_SITE_OTHER): Payer: Medicare HMO | Admitting: General Practice

## 2019-10-01 DIAGNOSIS — Z7901 Long term (current) use of anticoagulants: Secondary | ICD-10-CM

## 2019-10-01 LAB — POCT INR: INR: 2.8 (ref 2.0–3.0)

## 2019-10-01 NOTE — Patient Instructions (Addendum)
Pre visit review using our clinic review tool, if applicable. No additional management support is needed unless otherwise documented below in the visit note.  Continue to take 1 tablet daily.  Re-check in 6 weeks.

## 2019-10-03 ENCOUNTER — Ambulatory Visit: Payer: Medicare HMO | Admitting: Neurology

## 2019-10-12 ENCOUNTER — Other Ambulatory Visit: Payer: Medicare HMO | Admitting: *Deleted

## 2019-10-12 ENCOUNTER — Other Ambulatory Visit: Payer: Self-pay

## 2019-10-12 DIAGNOSIS — I509 Heart failure, unspecified: Secondary | ICD-10-CM

## 2019-10-13 LAB — BASIC METABOLIC PANEL
BUN/Creatinine Ratio: 19 (ref 10–24)
BUN: 19 mg/dL (ref 8–27)
CO2: 26 mmol/L (ref 20–29)
Calcium: 9.2 mg/dL (ref 8.6–10.2)
Chloride: 102 mmol/L (ref 96–106)
Creatinine, Ser: 1.01 mg/dL (ref 0.76–1.27)
GFR calc Af Amer: 82 mL/min/{1.73_m2} (ref >59)
GFR calc non Af Amer: 71 mL/min/{1.73_m2} (ref >59)
Glucose: 96 mg/dL (ref 65–99)
Potassium: 4.2 mmol/L (ref 3.5–5.2)
Sodium: 142 mmol/L (ref 134–144)

## 2019-10-13 LAB — PRO B NATRIURETIC PEPTIDE: NT-Pro BNP: 885 pg/mL — ABNORMAL HIGH (ref 0–486)

## 2019-10-20 ENCOUNTER — Ambulatory Visit: Payer: Medicare HMO | Attending: Internal Medicine

## 2019-10-20 DIAGNOSIS — Z23 Encounter for immunization: Secondary | ICD-10-CM

## 2019-10-20 NOTE — Progress Notes (Signed)
Covid-19 Vaccination Clinic  Name:  Collin [name]    MRN: [id number] DOB: Nov 24, 1940  10/20/2019  Mr. Blandin was observed post Covid-19 immunization for 15 minutes without incidence. He was provided with Vaccine Information Sheet and instruction to access the V-Safe system.   Mr. Willmann was instructed to call 911 with any severe reactions post vaccine: Marland Kitchen Difficulty breathing  . Swelling of your face and throat  . A fast heartbeat  . A bad rash all over your body  . Dizziness and weakness    Immunizations Administered    Name Date Dose VIS Date Route   Pfizer COVID-19 Vaccine 10/20/2019  2:25 PM 0.3 mL 08/17/2019 Intramuscular   Manufacturer: [employer], [employer]   Lot: [id number]   NDC: [address]-4

## 2019-11-06 ENCOUNTER — Other Ambulatory Visit: Payer: Medicare HMO

## 2019-11-06 ENCOUNTER — Other Ambulatory Visit: Payer: Self-pay

## 2019-11-06 DIAGNOSIS — I10 Essential (primary) hypertension: Secondary | ICD-10-CM | POA: Diagnosis not present

## 2019-11-06 DIAGNOSIS — E782 Mixed hyperlipidemia: Secondary | ICD-10-CM | POA: Diagnosis not present

## 2019-11-06 DIAGNOSIS — I5042 Chronic combined systolic (congestive) and diastolic (congestive) heart failure: Secondary | ICD-10-CM | POA: Diagnosis not present

## 2019-11-06 DIAGNOSIS — I4819 Other persistent atrial fibrillation: Secondary | ICD-10-CM | POA: Diagnosis not present

## 2019-11-06 LAB — PRO B NATRIURETIC PEPTIDE: NT-Pro BNP: 1034 pg/mL — ABNORMAL HIGH (ref 0–486)

## 2019-11-06 LAB — BASIC METABOLIC PANEL
BUN/Creatinine Ratio: 21 (ref 10–24)
BUN: 22 mg/dL (ref 8–27)
CO2: 23 mmol/L (ref 20–29)
Calcium: 9 mg/dL (ref 8.6–10.2)
Chloride: 102 mmol/L (ref 96–106)
Creatinine, Ser: 1.04 mg/dL (ref 0.76–1.27)
GFR calc Af Amer: 79 mL/min/{1.73_m2} (ref >59)
GFR calc non Af Amer: 68 mL/min/{1.73_m2} (ref >59)
Glucose: 100 mg/dL — ABNORMAL HIGH (ref 65–99)
Potassium: 4.1 mmol/L (ref 3.5–5.2)
Sodium: 141 mmol/L (ref 134–144)

## 2019-11-06 LAB — CBC
Hematocrit: 41.4 % (ref 37.5–51.0)
Hemoglobin: 13.8 g/dL (ref 13.0–17.7)
MCH: 30.3 pg (ref 26.6–33.0)
MCHC: 33.3 g/dL (ref 31.5–35.7)
MCV: 91 fL (ref 79–97)
Platelets: 173 10*3/uL (ref 150–450)
RBC: 4.56 x10E6/uL (ref 4.14–5.80)
RDW: 13.9 % (ref 11.6–15.4)
WBC: 2.5 10*3/uL — CL (ref 3.4–10.8)

## 2019-11-06 LAB — TSH: TSH: 1.51 u[IU]/mL (ref 0.450–4.500)

## 2019-11-09 ENCOUNTER — Other Ambulatory Visit: Payer: Self-pay

## 2019-11-12 ENCOUNTER — Ambulatory Visit (INDEPENDENT_AMBULATORY_CARE_PROVIDER_SITE_OTHER): Payer: Medicare HMO | Admitting: General Practice

## 2019-11-12 ENCOUNTER — Ambulatory Visit: Payer: Medicare HMO | Attending: Internal Medicine

## 2019-11-12 ENCOUNTER — Other Ambulatory Visit: Payer: Self-pay

## 2019-11-12 DIAGNOSIS — Z7901 Long term (current) use of anticoagulants: Secondary | ICD-10-CM | POA: Diagnosis not present

## 2019-11-12 DIAGNOSIS — Z23 Encounter for immunization: Secondary | ICD-10-CM

## 2019-11-12 LAB — POCT INR: INR: 3.7 — AB (ref 2.0–3.0)

## 2019-11-12 NOTE — Progress Notes (Signed)
Covid-19 Vaccination Clinic  Name:  Collin [name]    MRN: [id number] DOB: 01-Aug-1941  11/12/2019  [name] was observed post Covid-19 immunization for 15 minutes without incident. He was provided with Vaccine Information Sheet and instruction to access the V-Safe system.   [name] was instructed to call 911 with any severe reactions post vaccine: Marland Kitchen Difficulty breathing  . Swelling of face and throat  . A fast heartbeat  . A bad rash all over body  . Dizziness and weakness   Immunizations Administered    Name Date Dose VIS Date Route   Pfizer COVID-19 Vaccine 11/12/2019  1:40 PM 0.3 mL 08/17/2019 Intramuscular   Manufacturer: [employer], [employer]   Lot: [id number]   NDC: [id number]

## 2019-11-12 NOTE — Patient Instructions (Signed)
Pre visit review using our clinic review tool, if applicable. No additional management support is needed unless otherwise documented below in the visit note.  Do not take coumadin today (3/8) and then continue to take 1 tablet daily.  Re-check in 3 weeks.

## 2019-11-13 ENCOUNTER — Telehealth: Payer: Self-pay | Admitting: Internal Medicine

## 2019-11-13 DIAGNOSIS — I509 Heart failure, unspecified: Secondary | ICD-10-CM

## 2019-11-13 MED ORDER — TORSEMIDE 20 MG PO TABS: ORAL_TABLET | ORAL | 3 refills | Status: DC

## 2019-11-13 NOTE — Telephone Encounter (Signed)
New Message  Patients wife is returning call in reference to lab results. Please call.

## 2019-11-13 NOTE — Telephone Encounter (Signed)
Lab review from [name]: CBC :  Hgb is normal WBC is a little low  I would repeat in a few weeks with a differential Electrolytes and kidney function are normal  Fluid is up some  It has been worse before  It appears he is taking 20 mg torsemide alternating with 30 mg torsemide  Confirm  It this is case could take 30 mg 2 days in a row then alternate Repeat following week  When repeat CBC, repeat BMET  Watch salt intake _____________________________________________________________________________  Reviewed with patient's wife who manages patient's medications. Confirmed he has been taking torsemide 20 mg daily alternating with 30 mg daily.  He will now take torsemide 30 mg daily alt with 20 mg every third day. Will come for repeat labs on 11/28/19.

## 2019-11-28 ENCOUNTER — Other Ambulatory Visit: Payer: Self-pay

## 2019-11-28 ENCOUNTER — Other Ambulatory Visit: Payer: Medicare HMO | Admitting: *Deleted

## 2019-11-28 DIAGNOSIS — I509 Heart failure, unspecified: Secondary | ICD-10-CM | POA: Diagnosis not present

## 2019-11-28 LAB — CBC
Hematocrit: 39.4 % (ref 37.5–51.0)
Hemoglobin: 13.1 g/dL (ref 13.0–17.7)
MCH: 30.2 pg (ref 26.6–33.0)
MCHC: 33.2 g/dL (ref 31.5–35.7)
MCV: 91 fL (ref 79–97)
Platelets: 180 10*3/uL (ref 150–450)
RBC: 4.34 x10E6/uL (ref 4.14–5.80)
RDW: 13.5 % (ref 11.6–15.4)
WBC: 2.7 10*3/uL — ABNORMAL LOW (ref 3.4–10.8)

## 2019-11-28 LAB — BASIC METABOLIC PANEL
BUN/Creatinine Ratio: 25 — ABNORMAL HIGH (ref 10–24)
BUN: 24 mg/dL (ref 8–27)
CO2: 26 mmol/L (ref 20–29)
Calcium: 9 mg/dL (ref 8.6–10.2)
Chloride: 100 mmol/L (ref 96–106)
Creatinine, Ser: 0.97 mg/dL (ref 0.76–1.27)
GFR calc Af Amer: 86 mL/min/{1.73_m2} (ref >59)
GFR calc non Af Amer: 74 mL/min/{1.73_m2} (ref >59)
Glucose: 113 mg/dL — ABNORMAL HIGH (ref 65–99)
Potassium: 4.5 mmol/L (ref 3.5–5.2)
Sodium: 141 mmol/L (ref 134–144)

## 2019-11-29 DIAGNOSIS — E261 Secondary hyperaldosteronism: Secondary | ICD-10-CM | POA: Diagnosis not present

## 2019-11-29 DIAGNOSIS — Z008 Encounter for other general examination: Secondary | ICD-10-CM | POA: Diagnosis not present

## 2019-11-29 DIAGNOSIS — I509 Heart failure, unspecified: Secondary | ICD-10-CM | POA: Diagnosis not present

## 2019-11-29 DIAGNOSIS — I110 Hypertensive heart disease with heart failure: Secondary | ICD-10-CM | POA: Diagnosis not present

## 2019-11-29 DIAGNOSIS — D6869 Other thrombophilia: Secondary | ICD-10-CM | POA: Diagnosis not present

## 2019-11-29 DIAGNOSIS — I4891 Unspecified atrial fibrillation: Secondary | ICD-10-CM | POA: Diagnosis not present

## 2019-11-29 DIAGNOSIS — R69 Illness, unspecified: Secondary | ICD-10-CM | POA: Diagnosis not present

## 2019-11-29 DIAGNOSIS — E785 Hyperlipidemia, unspecified: Secondary | ICD-10-CM | POA: Diagnosis not present

## 2019-11-29 DIAGNOSIS — G4730 Sleep apnea, unspecified: Secondary | ICD-10-CM | POA: Diagnosis not present

## 2019-12-04 ENCOUNTER — Ambulatory Visit: Payer: Medicare HMO | Admitting: Neurology

## 2019-12-10 ENCOUNTER — Ambulatory Visit (INDEPENDENT_AMBULATORY_CARE_PROVIDER_SITE_OTHER): Payer: Medicare HMO | Admitting: General Practice

## 2019-12-10 ENCOUNTER — Other Ambulatory Visit: Payer: Self-pay

## 2019-12-10 DIAGNOSIS — Z7901 Long term (current) use of anticoagulants: Secondary | ICD-10-CM

## 2019-12-10 DIAGNOSIS — I480 Paroxysmal atrial fibrillation: Secondary | ICD-10-CM

## 2019-12-10 LAB — POCT INR: INR: 1.8 — AB (ref 2.0–3.0)

## 2019-12-10 NOTE — Patient Instructions (Addendum)
Pre visit review using our clinic review tool, if applicable. No additional management support is needed unless otherwise documented below in the visit note.  Take 1 1/2 tablets today and then continue to take 1 tablet daily.  Re-check in 4 weeks. 

## 2019-12-14 ENCOUNTER — Other Ambulatory Visit: Payer: Self-pay | Admitting: Family Medicine

## 2019-12-17 NOTE — Telephone Encounter (Signed)
Pt compliant with coumadin management for last 2 visits. Sent in script for 90 days

## 2019-12-17 NOTE — Telephone Encounter (Signed)
Please advise 

## 2019-12-18 ENCOUNTER — Ambulatory Visit (INDEPENDENT_AMBULATORY_CARE_PROVIDER_SITE_OTHER): Payer: Medicare HMO | Admitting: *Deleted

## 2019-12-18 DIAGNOSIS — I509 Heart failure, unspecified: Secondary | ICD-10-CM

## 2019-12-18 LAB — CUP PACEART REMOTE DEVICE CHECK
Battery Remaining Longevity: 132 mo
Battery Remaining Percentage: 100 %
Brady Statistic RV Percent Paced: 17 %
Date Time Interrogation Session: 20210413045100
HighPow Impedance: 41 Ohm
Implantable Lead Implant Date: 20060209
Implantable Lead Location: 753860
Implantable Lead Model: 158
Implantable Lead Serial Number: 156422
Implantable Pulse Generator Implant Date: 20150904
Lead Channel Impedance Value: 362 Ohm
Lead Channel Pacing Threshold Amplitude: 1 V
Lead Channel Pacing Threshold Pulse Width: 0.4 ms
Lead Channel Setting Pacing Amplitude: 2.4 V
Lead Channel Setting Pacing Pulse Width: 0.4 ms
Lead Channel Setting Sensing Sensitivity: 0.5 mV
Pulse Gen Serial Number: 191056

## 2019-12-19 NOTE — Progress Notes (Signed)
ICD Remote  

## 2019-12-25 ENCOUNTER — Other Ambulatory Visit: Payer: Self-pay

## 2019-12-25 ENCOUNTER — Telehealth: Payer: Self-pay | Admitting: Family Medicine

## 2019-12-25 NOTE — Telephone Encounter (Signed)
Pt made an appt  °

## 2019-12-25 NOTE — Telephone Encounter (Signed)
Please advise 

## 2019-12-25 NOTE — Telephone Encounter (Signed)
Pt wife called about bruise that has on his arm near elbow. Pt is on warfarin so she was not sure if she should be concerned about it or not.

## 2019-12-25 NOTE — Telephone Encounter (Signed)
Difficult to say without examining him.  We would be happy to see him if they are concerned.  We have plenty of openings tomorrow.

## 2019-12-26 ENCOUNTER — Encounter: Payer: Self-pay | Admitting: Family Medicine

## 2019-12-26 ENCOUNTER — Ambulatory Visit (INDEPENDENT_AMBULATORY_CARE_PROVIDER_SITE_OTHER): Payer: Medicare HMO | Admitting: General Practice

## 2019-12-26 ENCOUNTER — Ambulatory Visit (INDEPENDENT_AMBULATORY_CARE_PROVIDER_SITE_OTHER): Payer: Medicare HMO | Admitting: Family Medicine

## 2019-12-26 VITALS — BP 122/68 | HR 96 | Temp 97.7°F | Wt 209.2 lb

## 2019-12-26 DIAGNOSIS — R58 Hemorrhage, not elsewhere classified: Secondary | ICD-10-CM | POA: Diagnosis not present

## 2019-12-26 DIAGNOSIS — Z7901 Long term (current) use of anticoagulants: Secondary | ICD-10-CM

## 2019-12-26 DIAGNOSIS — I4891 Unspecified atrial fibrillation: Secondary | ICD-10-CM

## 2019-12-26 DIAGNOSIS — I480 Paroxysmal atrial fibrillation: Secondary | ICD-10-CM

## 2019-12-26 LAB — POCT INR: INR: 3.3 — AB (ref 2.0–3.0)

## 2019-12-26 NOTE — Patient Instructions (Addendum)
Pre visit review using our clinic review tool, if applicable. No additional management support is needed unless otherwise documented below in the visit note.  Hold coumadin today and then change dosage and take 1 tablet daily except 1/2 tablet on Wednesdays.  Re-check in 7 to 10 days.

## 2019-12-26 NOTE — Progress Notes (Signed)
Subjective:     Patient ID: Collin [name], male   DOB: 26-Jun-1941, 79 y.o.   MRN: [id number]  HPI   Collin [name] has chronic problems include atrial fibrillation, history of nonischemic cardiomyopathy, chronic diastolic heart failure, atrial fibrillation, history of tongue cancer, obstructive sleep apnea, type 2 diabetes.  He is seen today with some spontaneous ecchymosis left arm.  He has had some recent concern for some cognitive issues.  Wife states they noted bruising about 2 days ago.  He has not had any significant pain.  He does take Coumadin.  Last INR was 1.8 on 12-10-19.  He has not had any other bleeding issues.  He has been somewhat negligent with taking Coumadin and she is giving oversight for that.  Past Medical History:  Diagnosis Date  . AICD (automatic cardioverter/defibrillator) present   . Atrial fibrillation (HCC)   . Cancer (HCC)    tongue cancer  . Cardiomyopathy, nonischemic (HCC) 1998  . CHF (congestive heart failure) (HCC)   . Claustrophobia   . Diabetes mellitus   . Dysrhythmia   . Heart disease   . History of echocardiogram    Echo 8/18:  EF 55-60, no RWMA, severe LAE, normal RVSF, mild RAE  . History of radiation therapy 12/07/16- 01/24/17   Base of Tongue 70 Gy in 35 fractions  . Hyperlipidemia   . Memory loss   . OSA (obstructive sleep apnea)   . Shingles   . Umbilical hernia   . 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 It sometimes shows as a shadow on scans.    Past Surgical History:  Procedure Laterality Date  . CARDIAC DEFIBRILLATOR PLACEMENT  2006  . CATARACT EXTRACTION Bilateral    01/07/2016. 01/22/2016  . CYST EXCISION     multiple drainage to a cyst in his neck.   Marland Kitchen DIRECT LARYNGOSCOPY N/A 11/16/2016   Procedure: DIRECT LARYNGOSCOPY AND BIOPSY;  Surgeon: [name], MD;  Location: [facility] OR;  Service: ENT;  Laterality: N/A;  . EYE SURGERY    . HERNIA REPAIR  1950s  . IMPLANTABLE  CARDIOVERTER DEFIBRILLATOR (ICD) GENERATOR CHANGE N/A 05/10/2014   Procedure: ICD GENERATOR CHANGE;  Surgeon: [name], MD;  Location: [facility] CATH LAB;  Service: Cardiovascular;  Laterality: N/A;  . INSERTION OF MESH N/A 01/18/2018   Procedure: INSERTION OF MESH;  Surgeon: [name], MD;  Location: [facility] OR;  Service: General;  Laterality: N/A;  . IR GASTROSTOMY TUBE MOD SED  12/27/2016  . IR GASTROSTOMY TUBE REMOVAL  03/24/2018  . IR PATIENT EVAL TECH 0-60 MINS  01/17/2017  . MASS EXCISION Right 11/16/2016   Procedure: EXCISION RIGHT NECK LIPOMA;  Surgeon: [name], MD;  Location: [facility] OR;  Service: ENT;  Laterality: Right;  . MOHS SURGERY  01/01/2015   to top of head  . MOUTH SURGERY     teeth removed 4 teeth extracted 02/2010  . RADICAL NECK DISSECTION Right 07/25/2017   Procedure: RIGHT NECK DISSECTION;  Surgeon: [name], MD;  Location: [facility] OR;  Service: ENT;  Laterality: Right;  . UMBILICAL HERNIA REPAIR  01/18/2018   w/mesh  . UMBILICAL HERNIA REPAIR N/A 01/18/2018   Procedure: UMBILICAL HERNIA REPAIR WITH MESH;  Surgeon: [name], MD;  Location: [facility] OR;  Service: General;  Laterality: N/A;    reports that he has never smoked. He has never used smokeless tobacco.  He reports current alcohol use. He reports that he does not use drugs. family history includes Heart disease in his mother; Hypertension in his father; Lupus in his mother; Stroke in his father; Sudden death in his mother. Allergies  Allergen Reactions  . Niaspan [Niacin Er] Other (See Comments)    FLUSHED     Review of Systems  Constitutional: Negative for chills and fever.  Respiratory: Negative for shortness of breath.   Cardiovascular: Negative for chest pain.  Gastrointestinal: Negative for abdominal pain.  Hematological: Negative for adenopathy. Bruises/bleeds easily.       Objective:   Physical Exam Vitals reviewed.  Constitutional:      Appearance: Normal appearance.   Cardiovascular:     Rate and Rhythm: Normal rate.  Pulmonary:     Effort: Pulmonary effort is normal.     Breath sounds: Normal breath sounds.  Musculoskeletal:     Comments: No biceps muscle shortening.  Skin:    Comments: He has large bruise which is somewhat purplish in color involving left anterior arm distally.  No hematoma.  Nontender.  Full range of motion elbow and shoulder.  No bony tenderness.  Neurological:     Mental Status: He is alert.        Assessment:     #1 large area of ecchymosis left arm without history of recent known trauma  #2 chronic anticoagulation secondary to atrial fibrillation.    Plan:     -Check INR with his chronic Coumadin therapy.   INR 3.3 and coumadin adjusted per coumadin clinic.    - follow up promptly for any recurrent ecchymosis or other bleeding concerns.     - do not see any good indication for imaging at this time with no hx of trauma and no bony tenderness and excellent ROM all joints.  [name] MD [facility]

## 2019-12-26 NOTE — Patient Instructions (Signed)
Follow up for any recurrent bruises or other bleeding concerns.

## 2020-01-07 ENCOUNTER — Ambulatory Visit (INDEPENDENT_AMBULATORY_CARE_PROVIDER_SITE_OTHER): Payer: Medicare HMO | Admitting: General Practice

## 2020-01-07 ENCOUNTER — Other Ambulatory Visit: Payer: Self-pay

## 2020-01-07 DIAGNOSIS — Z7901 Long term (current) use of anticoagulants: Secondary | ICD-10-CM

## 2020-01-07 DIAGNOSIS — I480 Paroxysmal atrial fibrillation: Secondary | ICD-10-CM

## 2020-01-07 LAB — POCT INR: INR: 2 (ref 2.0–3.0)

## 2020-01-07 NOTE — Patient Instructions (Signed)
Pre visit review using our clinic review tool, if applicable. No additional management support is needed unless otherwise documented below in the visit note.  Continue to take 1 tablet daily  except 1/2 tablet on Wednesdays.  Re-check in 4 weeks. 

## 2020-01-26 ENCOUNTER — Other Ambulatory Visit: Payer: Self-pay | Admitting: Family Medicine

## 2020-01-31 ENCOUNTER — Other Ambulatory Visit: Payer: Self-pay | Admitting: Internal Medicine

## 2020-02-07 ENCOUNTER — Encounter (INDEPENDENT_AMBULATORY_CARE_PROVIDER_SITE_OTHER): Payer: Medicare HMO | Admitting: Ophthalmology

## 2020-02-07 ENCOUNTER — Other Ambulatory Visit: Payer: Self-pay

## 2020-02-07 DIAGNOSIS — E11319 Type 2 diabetes mellitus with unspecified diabetic retinopathy without macular edema: Secondary | ICD-10-CM

## 2020-02-07 DIAGNOSIS — H35373 Puckering of macula, bilateral: Secondary | ICD-10-CM

## 2020-02-07 DIAGNOSIS — E113293 Type 2 diabetes mellitus with mild nonproliferative diabetic retinopathy without macular edema, bilateral: Secondary | ICD-10-CM

## 2020-02-07 DIAGNOSIS — H35033 Hypertensive retinopathy, bilateral: Secondary | ICD-10-CM | POA: Diagnosis not present

## 2020-02-07 DIAGNOSIS — I10 Essential (primary) hypertension: Secondary | ICD-10-CM

## 2020-02-07 DIAGNOSIS — H43813 Vitreous degeneration, bilateral: Secondary | ICD-10-CM | POA: Diagnosis not present

## 2020-02-08 ENCOUNTER — Telehealth (INDEPENDENT_AMBULATORY_CARE_PROVIDER_SITE_OTHER): Payer: Medicare HMO | Admitting: Family Medicine

## 2020-02-08 DIAGNOSIS — R05 Cough: Secondary | ICD-10-CM

## 2020-02-08 DIAGNOSIS — R059 Cough, unspecified: Secondary | ICD-10-CM

## 2020-02-08 MED ORDER — HYDROCODONE-HOMATROPINE 5-1.5 MG/5ML PO SYRP: 5.0000 mL | ORAL_SOLUTION | Freq: Four times a day (QID) | ORAL | 0 refills | Status: AC | PRN

## 2020-02-08 MED ORDER — DOXYCYCLINE HYCLATE 100 MG PO CAPS: 100.0000 mg | ORAL_CAPSULE | Freq: Two times a day (BID) | ORAL | 0 refills | Status: DC

## 2020-02-08 NOTE — Progress Notes (Signed)
Patient ID: Collin [name], male   DOB: 1940-11-06, 79 y.o.   MRN: [id number]  This visit type was conducted due to national recommendations for restrictions regarding the COVID-19 pandemic in an effort to limit this patient's exposure and mitigate transmission in our community.   Virtual Visit via Video Note  I connected with Collin [name] on 02/08/20 at  2:15 PM EDT by a video enabled telemedicine application and verified that I am speaking with the correct person using two identifiers.  Location patient: home Location provider:work or home office Persons participating in the virtual visit: patient, provider  I discussed the limitations of evaluation and management by telemedicine and the availability of in person appointments. The patient expressed understanding and agreed to proceed.   HPI: Patient called with cough productive of green sputum he has had cough which was severe yesterday and last night.  They tried NyQuil without much relief.  Temperature 99.1.  Cough productive of green sputum.  Increased fatigue symptoms.  Patient has had Covid vaccine back in February and March.  No hemoptysis.  No recent appetite or weight change.  He is on chronic Coumadin therapy.  No sick contacts.   Wife also relates he has had some intermittent right hand numbness and increased stiffness in several joints recently.   ROS: See pertinent positives and negatives per HPI.  Past Medical History:  Diagnosis Date  . AICD (automatic cardioverter/defibrillator) present   . Atrial fibrillation (HCC)   . Cancer (HCC)    tongue cancer  . Cardiomyopathy, nonischemic (HCC) 1998  . CHF (congestive heart failure) (HCC)   . Claustrophobia   . Diabetes mellitus   . Dysrhythmia   . Heart disease   . History of echocardiogram    Echo 8/18:  EF 55-60, no RWMA, severe LAE, normal RVSF, mild RAE  . History of radiation therapy 12/07/16- 01/24/17   Base of Tongue 70 Gy in 35 fractions  . Hyperlipidemia   .  Memory loss   . OSA (obstructive sleep apnea)   . Shingles   . Umbilical hernia   . 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 It sometimes shows as a shadow on scans.     Past Surgical History:  Procedure Laterality Date  . CARDIAC DEFIBRILLATOR PLACEMENT  2006  . CATARACT EXTRACTION Bilateral    01/07/2016. 01/22/2016  . CYST EXCISION     multiple drainage to a cyst in his neck.   Marland Kitchen DIRECT LARYNGOSCOPY N/A 11/16/2016   Procedure: DIRECT LARYNGOSCOPY AND BIOPSY;  Surgeon: [name], MD;  Location: [facility] OR;  Service: ENT;  Laterality: N/A;  . EYE SURGERY    . HERNIA REPAIR  1950s  . IMPLANTABLE CARDIOVERTER DEFIBRILLATOR (ICD) GENERATOR CHANGE N/A 05/10/2014   Procedure: ICD GENERATOR CHANGE;  Surgeon: [name], MD;  Location: [facility] C[facility] LAB;  Service: Cardiovascular;  Laterality: N/A;  . INSERTION OF MESH N/A 01/18/2018   Procedure: INSERTION OF MESH;  Surgeon: [name], MD;  Location: [facility] OR;  Service: General;  Laterality: N/A;  . IR GASTROSTOMY TUBE MOD SED  12/27/2016  . IR GASTROSTOMY TUBE REMOVAL  03/24/2018  . IR PATIENT EVAL TECH 0-60 MINS  01/17/2017  . MASS EXCISION Right 11/16/2016   Procedure: EXCISION RIGHT NECK LIPOMA;  Surgeon: [name], MD;  Location: [facility] OR;  Service: ENT;  Laterality: Right;  . MOHS SURGERY  01/01/2015  to top of head  . MOUTH SURGERY     teeth removed 4 teeth extracted 02/2010  . RADICAL NECK DISSECTION Right 07/25/2017   Procedure: RIGHT NECK DISSECTION;  Surgeon: [name], MD;  Location: [facility] OR;  Service: ENT;  Laterality: Right;  . UMBILICAL HERNIA REPAIR  01/18/2018   w/mesh  . UMBILICAL HERNIA REPAIR N/A 01/18/2018   Procedure: UMBILICAL HERNIA REPAIR WITH MESH;  Surgeon: [name], MD;  Location: [facility] OR;  Service: General;  Laterality: N/A;    Family History  Problem Relation Age of Onset  . Heart disease Mother   . Sudden death Mother        under  age 28  . Lupus Mother   . Hypertension Father   . Stroke Father     SOCIAL HX: non-smoker.   Current Outpatient Medications:  .  acetaminophen (TYLENOL) 500 MG tablet, Take 1,000 mg by mouth daily as needed for moderate pain. , Disp: , Rfl:  .  carvedilol (COREG) 25 MG tablet, TAKE ONE TABLET BY MOUTH TWICE A DAY NEEDS APPOINTMENT, Disp: 180 tablet, Rfl: 2 .  Cholecalciferol 100 MCG (4000 UT) TABS, Take 4,000 Units by mouth daily., Disp: 30 tablet, Rfl:  .  diltiazem (CARDIZEM) 90 MG tablet, TAKE ONE TABLET BY MOUTH DAILY, Disp: 90 tablet, Rfl: 3 .  donepezil (ARICEPT) 10 MG tablet, Take 1 tablet daily, Disp: 90 tablet, Rfl: 3 .  doxycycline (VIBRAMYCIN) 100 MG capsule, Take 1 capsule (100 mg total) by mouth 2 (two) times daily., Disp: 20 capsule, Rfl: 0 .  escitalopram (LEXAPRO) 20 MG tablet, Take 1 tablet (20 mg total) by mouth daily., Disp: 90 tablet, Rfl: 3 .  HYDROcodone-homatropine (HYCODAN) 5-1.5 MG/5ML syrup, Take 5 mLs by mouth every 6 (six) hours as needed for up to 10 days., Disp: 120 mL, Rfl: 0 .  polyethylene glycol (MIRALAX) packet, Take 17 g by mouth daily as needed. Available OTC, Disp: 30 each, Rfl: 0 .  potassium chloride (K-DUR) 10 MEQ tablet, DISSOLVE 1 TABLET IN  WATER AND ADMINISTER VIA PEG TUDE DAILY, Disp: 90 tablet, Rfl: 2 .  rosuvastatin (CRESTOR) 10 MG tablet, TAKE ONE TABLET BY MOUTH DAILY, Disp: 90 tablet, Rfl: 1 .  sodium fluoride (DENTA 5000 PLUS) 1.1 % CREA dental cream, Apply gel to tooth brush. Brush teeth for 2 minutes. Spit out excess-DO NOT swallow. Repeat nightly., Disp: 1 Tube, Rfl: 0 .  torsemide (DEMADEX) 20 MG tablet, Take 30 mg daily alternating with 20 mg every third day, Disp: 180 tablet, Rfl: 3 .  warfarin (COUMADIN) 5 MG tablet, TAKE ONE TABLET BY MOUTH DAILY OR AS DIRECTED BY ANTICOAGULATION CLINIC, Disp: 90 tablet, Rfl: 0  EXAM:  VITALS per patient if applicable:  GENERAL: alert, oriented, appears well and in no acute distress  HEENT:  atraumatic, conjunttiva clear, no obvious abnormalities on inspection of external nose and ears  NECK: normal movements of the head and neck  LUNGS: on inspection no signs of respiratory distress, breathing rate appears normal, no obvious gross SOB, gasping or wheezing  CV: no obvious cyanosis  MS: moves all visible extremities without noticeable abnormality  PSYCH/NEURO: pleasant and cooperative, no obvious depression or anxiety, speech and thought processing grossly intact  ASSESSMENT AND PLAN:  Discussed the following assessment and plan:  #1 productive cough.  Patient in no respiratory distress at this time.  He does have high risk for complications given his age and other comorbidities  -We elected to  go ahead and cover with doxycycline 100 mg twice daily for 10 days -Hycodan cough syrup 1 teaspoon nightly as needed for severe cough -Follow-up immediately for any fever, increased shortness of breath, or other concerns    I discussed the assessment and treatment plan with the patient. The patient was provided an opportunity to ask questions and all were answered. The patient agreed with the plan and demonstrated an understanding of the instructions.   The patient was advised to call back or seek an in-person evaluation if the symptoms worsen or if the condition fails to improve as anticipated.   Evelena Peat, MD

## 2020-02-11 ENCOUNTER — Ambulatory Visit: Payer: Medicare HMO

## 2020-02-18 ENCOUNTER — Ambulatory Visit (INDEPENDENT_AMBULATORY_CARE_PROVIDER_SITE_OTHER): Payer: Medicare HMO | Admitting: General Practice

## 2020-02-18 ENCOUNTER — Other Ambulatory Visit: Payer: Self-pay

## 2020-02-18 DIAGNOSIS — Z7901 Long term (current) use of anticoagulants: Secondary | ICD-10-CM

## 2020-02-18 DIAGNOSIS — I480 Paroxysmal atrial fibrillation: Secondary | ICD-10-CM | POA: Diagnosis not present

## 2020-02-18 LAB — POCT INR: INR: 2.3 (ref 2.0–3.0)

## 2020-02-18 NOTE — Patient Instructions (Signed)
Pre visit review using our clinic review tool, if applicable. No additional management support is needed unless otherwise documented below in the visit note.  ontinue to take 1 tablet daily except 1/2 tablet on Wednesdays.  Re-check in 4 weeks.

## 2020-02-20 ENCOUNTER — Telehealth: Payer: Self-pay | Admitting: Family Medicine

## 2020-02-20 ENCOUNTER — Encounter: Payer: Self-pay | Admitting: Neurology

## 2020-02-20 ENCOUNTER — Other Ambulatory Visit: Payer: Self-pay

## 2020-02-20 ENCOUNTER — Ambulatory Visit: Payer: Medicare HMO | Admitting: Neurology

## 2020-02-20 VITALS — BP 110/70 | HR 71 | Ht 73.0 in | Wt 204.2 lb

## 2020-02-20 DIAGNOSIS — F0391 Unspecified dementia with behavioral disturbance: Secondary | ICD-10-CM

## 2020-02-20 DIAGNOSIS — R69 Illness, unspecified: Secondary | ICD-10-CM | POA: Diagnosis not present

## 2020-02-20 DIAGNOSIS — F03B18 Unspecified dementia, moderate, with other behavioral disturbance: Secondary | ICD-10-CM

## 2020-02-20 NOTE — Telephone Encounter (Signed)
If no fever and no increased shortness of breath give this another few days.  Touch base by next week if not improving

## 2020-02-20 NOTE — Telephone Encounter (Signed)
Collin [name] given information and said they will monitor and call back if not feeling better by next week

## 2020-02-20 NOTE — Telephone Encounter (Signed)
Pt's spouse, Clarene Critchley, stated that the pt finished his antibiotics but still coughing. She does not know if they should wait a few days to see if it subsides since  He just finished or they should do a follow up?   Pt cna be reached at [phone] ok to leave a vm per pt

## 2020-02-20 NOTE — Patient Instructions (Signed)
1. Wean off the Lexapro 20mg : Take 1/2 tablet daily for a week, then stop. Once off Lexapro, start the Paxil 10mg  daily  2. Call our office for an update in a month. We may adjust dose of Paxil or start the new medication for memory, Namenda  3. No further driving  4. Follow-up in 6 months, call for any changes

## 2020-02-20 NOTE — Telephone Encounter (Signed)
Please advise 
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
## 2020-02-22 ENCOUNTER — Other Ambulatory Visit: Payer: Self-pay | Admitting: Internal Medicine

## 2020-02-29 ENCOUNTER — Telehealth: Payer: Self-pay | Admitting: Neurology

## 2020-02-29 MED ORDER — PAROXETINE HCL 10 MG PO TABS: 10.0000 mg | ORAL_TABLET | Freq: Every day | ORAL | 11 refills | Status: DC

## 2020-02-29 NOTE — Telephone Encounter (Signed)
Pls let wife know Paxil 10mg  daily Rx sent to [facility], thanks

## 2020-02-29 NOTE — Telephone Encounter (Signed)
Patient's wife called in stating her husband was supposed to slowly wean himself off of Lexapro and then start Paxil. She said their pharmacy doesn't have a prescription for Paxil. He has already weaned himself off from the Lexapro and needs the Paxil. Can it be sent to the [facility] on [address]

## 2020-03-13 DIAGNOSIS — H35373 Puckering of macula, bilateral: Secondary | ICD-10-CM | POA: Diagnosis not present

## 2020-03-13 DIAGNOSIS — H40013 Open angle with borderline findings, low risk, bilateral: Secondary | ICD-10-CM | POA: Diagnosis not present

## 2020-03-13 DIAGNOSIS — E113293 Type 2 diabetes mellitus with mild nonproliferative diabetic retinopathy without macular edema, bilateral: Secondary | ICD-10-CM | POA: Diagnosis not present

## 2020-03-13 DIAGNOSIS — Z961 Presence of intraocular lens: Secondary | ICD-10-CM | POA: Diagnosis not present

## 2020-03-13 LAB — HM DIABETES EYE EXAM

## 2020-03-14 ENCOUNTER — Encounter: Payer: Self-pay | Admitting: Family Medicine

## 2020-03-17 ENCOUNTER — Ambulatory Visit (INDEPENDENT_AMBULATORY_CARE_PROVIDER_SITE_OTHER): Payer: Medicare HMO | Admitting: General Practice

## 2020-03-17 ENCOUNTER — Other Ambulatory Visit: Payer: Self-pay

## 2020-03-17 DIAGNOSIS — Z7901 Long term (current) use of anticoagulants: Secondary | ICD-10-CM

## 2020-03-17 DIAGNOSIS — I480 Paroxysmal atrial fibrillation: Secondary | ICD-10-CM | POA: Diagnosis not present

## 2020-03-17 LAB — POCT INR: INR: 2.1 (ref 2.0–3.0)

## 2020-03-17 NOTE — Patient Instructions (Addendum)
Pre visit review using our clinic review tool, if applicable. No additional management support is needed unless otherwise documented below in the visit note. ° °Continue to take 1 tablet daily  except 1/2 tablet on Wednesdays.  Re-check in 6 weeks. °

## 2020-03-18 ENCOUNTER — Ambulatory Visit (INDEPENDENT_AMBULATORY_CARE_PROVIDER_SITE_OTHER): Payer: Medicare HMO | Admitting: *Deleted

## 2020-03-18 ENCOUNTER — Encounter: Payer: Self-pay | Admitting: Family Medicine

## 2020-03-18 DIAGNOSIS — I428 Other cardiomyopathies: Secondary | ICD-10-CM

## 2020-03-18 LAB — CUP PACEART REMOTE DEVICE CHECK
Battery Remaining Longevity: 126 mo
Battery Remaining Percentage: 100 %
Brady Statistic RV Percent Paced: 18 %
Date Time Interrogation Session: 20210713072700
HighPow Impedance: 39 Ohm
Implantable Lead Implant Date: 20060209
Implantable Lead Location: 753860
Implantable Lead Model: 158
Implantable Lead Serial Number: 156422
Implantable Pulse Generator Implant Date: 20150904
Lead Channel Impedance Value: 361 Ohm
Lead Channel Pacing Threshold Amplitude: 1 V
Lead Channel Pacing Threshold Pulse Width: 0.4 ms
Lead Channel Setting Pacing Amplitude: 2.4 V
Lead Channel Setting Pacing Pulse Width: 0.4 ms
Lead Channel Setting Sensing Sensitivity: 0.5 mV
Pulse Gen Serial Number: 191056

## 2020-03-20 NOTE — Progress Notes (Signed)
Remote ICD transmission.   

## 2020-04-12 ENCOUNTER — Other Ambulatory Visit: Payer: Self-pay | Admitting: Family Medicine

## 2020-04-14 NOTE — Telephone Encounter (Signed)
Please advise 

## 2020-04-15 ENCOUNTER — Ambulatory Visit (INDEPENDENT_AMBULATORY_CARE_PROVIDER_SITE_OTHER): Payer: Medicare HMO | Admitting: Family Medicine

## 2020-04-15 ENCOUNTER — Encounter: Payer: Self-pay | Admitting: Family Medicine

## 2020-04-15 ENCOUNTER — Other Ambulatory Visit: Payer: Self-pay

## 2020-04-15 VITALS — BP 126/70 | HR 85 | Temp 98.0°F | Wt 209.4 lb

## 2020-04-15 DIAGNOSIS — R1310 Dysphagia, unspecified: Secondary | ICD-10-CM | POA: Diagnosis not present

## 2020-04-15 DIAGNOSIS — R221 Localized swelling, mass and lump, neck: Secondary | ICD-10-CM

## 2020-04-15 NOTE — Patient Instructions (Signed)
Dysphagia  Dysphagia is trouble swallowing. This condition occurs when solids and liquids stick in a person's throat on the way down to the stomach, or when food takes longer to get to the stomach than usual. You may have problems swallowing food, liquids, or both. You may also have pain while trying to swallow. It may take you more time and effort to swallow something. What are the causes? This condition may be caused by:  Muscle problems. They may make it difficult for you to move food and liquids through the esophagus, which is the tube that connects your mouth to your stomach.  Blockages. You may have ulcers, scar tissue, or inflammation that blocks the normal passage of food and liquids. Causes of these problems include: ? Acid reflux from your stomach into your esophagus (gastroesophageal reflux). ? Infections. ? Radiation treatment for cancer. ? Medicines taken without enough fluids to wash them down into your stomach.  Stroke. This can affect the nerves and make it difficult to swallow.  Nerve problems. These prevent signals from being sent to the muscles of your esophagus to squeeze (contract) and move what you swallow down to your stomach.  Globus pharyngeus. This is a common problem that involves a feeling like something is stuck in your throat or a sense of trouble with swallowing, even though nothing is wrong with the swallowing passages.  Certain conditions, such as cerebral palsy or Parkinson's disease. What are the signs or symptoms? Common symptoms of this condition include:  A feeling that solids or liquids are stuck in your throat on the way down to the stomach.  Pain while swallowing.  Coughing or gagging while trying to swallow. Other symptoms include:  Food moving back from your stomach to your mouth (regurgitation).  Noises coming from your throat.  Chest discomfort with swallowing.  A feeling of fullness when swallowing.  Drooling, especially when the  throat is blocked.  Heartburn. How is this diagnosed? This condition may be diagnosed by:  Barium X-ray. In this test, you will swallow a white liquid that sticks to the inside of your esophagus. X-ray images are then taken.  Endoscopy. In this test, a flexible telescope is inserted down your throat to look at your esophagus and your stomach.  CT scans and an MRI. How is this treated? Treatment for dysphagia depends on the cause of this condition, such as:  If the dysphagia is caused by acid reflux or infection, medicines may be used. They may include antibiotics and heartburn medicines.  If the dysphagia is caused by problems with the muscles, swallowing therapy may be used to help you strengthen your swallowing muscles. You may have to do specific exercises to strengthen the muscles or stretch them.  If the dysphagia is caused by a blockage or mass, procedures to remove the blockage may be done. You may need surgery and a feeding tube. You may need to make diet changes. Ask your health care provider for specific instructions. Follow these instructions at home: Medicines  Take over-the-counter and prescription medicines only as told by your health care provider.  If you were prescribed an antibiotic medicine, take it as told by your health care provider. Do not stop taking the antibiotic even if you start to feel better. Eating and drinking   Follow any diet changes as told by your health care provider.  Work with a diet and nutrition specialist (dietitian) to create an eating plan that will help you get the nutrients you need in   order to stay healthy.  Eat soft foods that are easier to swallow.  Cut your food into small pieces and eat slowly. Take small bites.  Eat and drink only when you are sitting upright.  Do not drink alcohol or caffeine. If you need help quitting, ask your health care provider. General instructions  Check your weight every day to make sure you are  not losing weight.  Do not use any products that contain nicotine or tobacco, such as cigarettes, e-cigarettes, and chewing tobacco. If you need help quitting, ask your health care provider.  Keep all follow-up visits as told by your health care provider. This is important. Contact a health care provider if you:  Lose weight because you cannot swallow.  Cough when you drink liquids.  Cough up partially digested food. Get help right away if you:  Cannot swallow your saliva.  Have shortness of breath, a fever, or both.  Have a hoarse voice and also have trouble swallowing. Summary  Dysphagia is trouble swallowing. This condition occurs when solids and liquids stick in a person's throat on the way down to the stomach. You may cough or gag while trying to swallow.  Dysphagia has many possible causes.  Treatment for dysphagia depends on the cause of the condition.  Keep all follow-up visits as told by your health care provider. This is important. This information is not intended to replace advice given to you by your health care provider. Make sure you discuss any questions you have with your health care provider. Document Revised: 01/17/2019 Document Reviewed: 01/17/2019 Elsevier Patient Education  2020 [employer]  

## 2020-04-15 NOTE — Progress Notes (Signed)
Established Patient Office Visit  Subjective:  Patient ID: Collin [name], male    DOB: 04-Nov-1940  Age: 79 y.o. MRN: [id number]  CC:  Chief Complaint  Patient presents with  . Dysphagia    since surgery     HPI CHALES UMEMOTO presents for "difficulty swallowing "since his head and neck surgery for tongue cancer back in 2018.  His chronic problems include history of atrial fibrillation, nonischemic cardiomyopathy, diastolic heart failure, atrial fibrillation, obstructive sleep apnea, history of type 2 diabetes.  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 Head CT soft tissues of the neck back in December 2018 which showed improvement in base of tongue mass with no measurable tumor remaining and also improvement of bilateral adenopathy with extensive postradiation changes of the pharynx, larynx, anterior soft tissues of the neck.  Patient and wife relate that he has had a "lump" somewhat firm to palpation right anterior neck over several months.  He has had difficulty swallowing solids but not liquids although wife states that he frequently coughs and chokes with any foods.  His appetite is good and his weight is stable.  They state he had some swallowing difficulty since the surgery but perhaps slightly progressive.  Denies any active reflux symptoms.  Past Medical History:  Diagnosis Date  . AICD (automatic cardioverter/defibrillator) present   . Atrial fibrillation (HCC)   . Cancer (HCC)    tongue cancer  . Cardiomyopathy, nonischemic (HCC) 1998  . CHF (congestive heart failure) (HCC)   . Claustrophobia   . Diabetes mellitus   . Dysrhythmia   . Heart disease   . History of echocardiogram    Echo 8/18:  EF 55-60, no RWMA, severe LAE, normal RVSF, mild RAE  . History of radiation therapy 12/07/16- 01/24/17   Base of Tongue 70 Gy in 35 fractions  . Hyperlipidemia   . Memory loss   . OSA (obstructive sleep apnea)    . Shingles   . Umbilical hernia   . 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 It sometimes shows as a shadow on scans.     Past Surgical History:  Procedure Laterality Date  . CARDIAC DEFIBRILLATOR PLACEMENT  2006  . CATARACT EXTRACTION Bilateral    01/07/2016. 01/22/2016  . CYST EXCISION     multiple drainage to a cyst in his neck.   Marland Kitchen DIRECT LARYNGOSCOPY N/A 11/16/2016   Procedure: DIRECT LARYNGOSCOPY AND BIOPSY;  Surgeon: [name], MD;  Location: [facility] OR;  Service: ENT;  Laterality: N/A;  . EYE SURGERY    . HERNIA REPAIR  1950s  . IMPLANTABLE CARDIOVERTER DEFIBRILLATOR (ICD) GENERATOR CHANGE N/A 05/10/2014   Procedure: ICD GENERATOR CHANGE;  Surgeon: [name], MD;  Location: [facility] CATH LAB;  Service: Cardiovascular;  Laterality: N/A;  . INSERTION OF MESH N/A 01/18/2018   Procedure: INSERTION OF MESH;  Surgeon: [name], MD;  Location: [facility] OR;  Service: General;  Laterality: N/A;  . IR GASTROSTOMY TUBE MOD SED  12/27/2016  . IR GASTROSTOMY TUBE REMOVAL  03/24/2018  . IR PATIENT EVAL TECH 0-60 MINS  01/17/2017  . MASS EXCISION Right 11/16/2016   Procedure: EXCISION RIGHT NECK LIPOMA;  Surgeon: [name], MD;  Location: [facility] OR;  Service: ENT;  Laterality: Right;  . MOHS SURGERY  01/01/2015   to top of  head  . MOUTH SURGERY     teeth removed 4 teeth extracted 02/2010  . RADICAL NECK DISSECTION Right 07/25/2017   Procedure: RIGHT NECK DISSECTION;  Surgeon: [name], MD;  Location: [facility] OR;  Service: ENT;  Laterality: Right;  . UMBILICAL HERNIA REPAIR  01/18/2018   w/mesh  . UMBILICAL HERNIA REPAIR N/A 01/18/2018   Procedure: UMBILICAL HERNIA REPAIR WITH MESH;  Surgeon: [name], MD;  Location: [facility] OR;  Service: General;  Laterality: N/A;    Family History  Problem Relation Age of Onset  . Heart disease Mother   . Sudden death Mother        under age 4  . Lupus Mother   . Hypertension Father    . Stroke Father     Social History   Socioeconomic History  . Marital status: Married    Spouse name: [name]  . Number of children: 3  . Years of education: Not on file  . Highest education level: Not on file  Occupational History  . Occupation: Retired Engineer, materials  Tobacco Use  . Smoking status: Never Smoker  . Smokeless tobacco: Never Used  Vaping Use  . Vaping Use: Never used  Substance and Sexual Activity  . Alcohol use: Yes    Comment: "a little wine every once in a while" 01/17/2018  . Drug use: No  . Sexual activity: Not on file  Other Topics Concern  . Not on file  Social History Narrative   Pt lives in split level home with his wife, Aggie Cosier.   Has 3 sons   9th grade education   Retired Electrical engineer   Right handed   Social Determinants of Health   Financial Resource Strain:   . Difficulty of Paying Living Expenses:   Food Insecurity:   . Worried About Programme researcher, broadcasting/film/video in the Last Year:   . Barista in the Last Year:   Transportation Needs:   . Freight forwarder (Medical):   Marland Kitchen Lack of Transportation (Non-Medical):   Physical Activity:   . Days of Exercise per Week:   . Minutes of Exercise per Session:   Stress:   . Feeling of Stress :   Social Connections:   . Frequency of Communication with Friends and Family:   . Frequency of Social Gatherings with Friends and Family:   . Attends Religious Services:   . Active Member of Clubs or Organizations:   . Attends Banker Meetings:   Marland Kitchen Marital Status:   Intimate Partner Violence:   . Fear of Current or Ex-Partner:   . Emotionally Abused:   Marland Kitchen Physically Abused:   . Sexually Abused:     Outpatient Medications Prior to Visit  Medication Sig Dispense Refill  . acetaminophen (TYLENOL) 500 MG tablet Take 1,000 mg by mouth daily as needed for moderate pain.     . carvedilol (COREG) 25 MG tablet TAKE ONE TABLET BY MOUTH TWICE A DAY NEEDS APPOINTMENT 180 tablet 2  .  diltiazem (CARDIZEM) 90 MG tablet TAKE ONE TABLET BY MOUTH DAILY 90 tablet 3  . donepezil (ARICEPT) 10 MG tablet Take 1 tablet daily 90 tablet 3  . escitalopram (LEXAPRO) 20 MG tablet Take 1 tablet (20 mg total) by mouth daily. 90 tablet 3  . PARoxetine (PAXIL) 10 MG tablet Take 1 tablet (10 mg total) by mouth daily. 30 tablet 11  . polyethylene glycol (MIRALAX) packet Take 17 g by mouth daily as  needed. Available OTC 30 each 0  . potassium chloride (KLOR-CON) 10 MEQ tablet DISSOLVE 1 TABLET IN WATER DAILY AND ADMINSTER VIA PEG TUBE 90 tablet 1  . rosuvastatin (CRESTOR) 10 MG tablet TAKE ONE TABLET BY MOUTH DAILY 90 tablet 1  . torsemide (DEMADEX) 20 MG tablet Take 30 mg daily alternating with 20 mg every third day 180 tablet 3  . warfarin (COUMADIN) 5 MG tablet TAKE ONE TABLET BY MOUTH DAILY OR AS INSTRUCTED BY COUMADIN CLINIC 90 tablet 0   No facility-administered medications prior to visit.    Allergies  Allergen Reactions  . Niaspan [Niacin Er] Other (See Comments)    FLUSHED    ROS Review of Systems  Constitutional: Negative for appetite change and unexpected weight change.  HENT: Positive for trouble swallowing.   Respiratory: Negative for cough and shortness of breath.   Cardiovascular: Negative for chest pain.  Gastrointestinal: Negative for abdominal pain.  Hematological: Negative for adenopathy.      Objective:    Physical Exam Constitutional:      Appearance: Normal appearance.  HENT:     Mouth/Throat:     Pharynx: No oropharyngeal exudate or posterior oropharyngeal erythema.  Neck:     Comments: He has a long scar right side of neck from prior surgery.  He has some soft tissue edema particular in the right submandibular region.  No distinct adenopathy palpated. Cardiovascular:     Rate and Rhythm: Normal rate.  Pulmonary:     Effort: Pulmonary effort is normal.     Breath sounds: Normal breath sounds.  Musculoskeletal:     Cervical back: Neck supple.      Right lower leg: No edema.     Left lower leg: No edema.  Neurological:     Mental Status: He is alert.     BP 126/70 (BP Location: Left Arm, Patient Position: Sitting, Cuff Size: Normal)   Pulse 85   Temp 98 F (36.7 C) (Oral)   Wt 209 lb 6.4 oz (95 kg)   SpO2 95%   BMI 27.63 kg/m  Wt Readings from Last 3 Encounters:  04/15/20 209 lb 6.4 oz (95 kg)  02/20/20 204 lb 3.2 oz (92.6 kg)  12/26/19 209 lb 3.2 oz (94.9 kg)     Health Maintenance Due  Topic Date Due  . Hepatitis C Screening  Never done  . FOOT EXAM  Never done  . URINE MICROALBUMIN  Never done  . TETANUS/TDAP  Never done  . HEMOGLOBIN A1C  01/24/2020  . INFLUENZA VACCINE  04/06/2020    There are no preventive care reminders to display for this patient.  Lab Results  Component Value Date   TSH 1.510 11/06/2019   Lab Results  Component Value Date   WBC 2.7 (L) 11/28/2019   HGB 13.1 11/28/2019   HCT 39.4 11/28/2019   MCV 91 11/28/2019   PLT 180 11/28/2019   Lab Results  Component Value Date   NA 141 11/28/2019   K 4.5 11/28/2019   CHLORIDE 100 01/17/2017   CO2 26 11/28/2019   GLUCOSE 113 (H) 11/28/2019   BUN 24 11/28/2019   CREATININE 0.97 11/28/2019   BILITOT 0.6 11/18/2017   ALKPHOS 83 11/18/2017   AST 19 11/18/2017   ALT 23 11/18/2017   PROT 5.9 (L) 11/18/2017   ALBUMIN 2.9 (L) 11/18/2017   CALCIUM 9.0 11/28/2019   ANIONGAP 13 01/17/2018   EGFR >60 09/01/2017   GFR 109.67 11/13/2015   Lab Results  Component Value Date   CHOL 136 07/27/2019   Lab Results  Component Value Date   HDL 75 07/27/2019   Lab Results  Component Value Date   LDLCALC 40 07/27/2019   Lab Results  Component Value Date   TRIG 122 07/27/2019   Lab Results  Component Value Date   CHOLHDL 1.8 07/27/2019   Lab Results  Component Value Date   HGBA1C 5.8 (H) 07/27/2019      Assessment & Plan:   Progressive solid food dysphagia in a patient with prior history of malignancy base of the tongue with prior  radiation therapy and surgery.  Fortunately, he does not have any appetite change or weight loss.  Question is whether this is esophageal related to other etiologies such as esophageal stricture versus compression related to any kind of recurrent mass  He does have some soft tissue prominence anterior neck and question is whether this is post radiation changes versus recurrent mass  -Recommend repeat CT neck soft tissue with contrast -Consider GI referral for consideration for EGD versus barium swallow to further investigate his dysphagia  No orders of the defined types were placed in this encounter.   Follow-up: No follow-ups on file.    Evelena Peat, MD

## 2020-04-28 ENCOUNTER — Ambulatory Visit (INDEPENDENT_AMBULATORY_CARE_PROVIDER_SITE_OTHER): Payer: Medicare HMO | Admitting: General Practice

## 2020-04-28 ENCOUNTER — Other Ambulatory Visit: Payer: Self-pay

## 2020-04-28 DIAGNOSIS — Z7901 Long term (current) use of anticoagulants: Secondary | ICD-10-CM

## 2020-04-28 DIAGNOSIS — I480 Paroxysmal atrial fibrillation: Secondary | ICD-10-CM

## 2020-04-28 LAB — POCT INR: INR: 1.5 — AB (ref 2.0–3.0)

## 2020-04-28 NOTE — Patient Instructions (Addendum)
Pre visit review using our clinic review tool, if applicable. No additional management support is needed unless otherwise documented below in the visit note.  Take 1 1/2 tablets today and tomorrow and then take 1 tablet on Wednesday and then continue to take 1 tablet daily except 1/2 tablet on Wednesdays.  Re-check in 3 weeks.

## 2020-05-06 ENCOUNTER — Encounter: Payer: Self-pay | Admitting: Nurse Practitioner

## 2020-05-19 ENCOUNTER — Other Ambulatory Visit: Payer: Self-pay

## 2020-05-19 ENCOUNTER — Ambulatory Visit (INDEPENDENT_AMBULATORY_CARE_PROVIDER_SITE_OTHER): Payer: Medicare HMO | Admitting: General Practice

## 2020-05-19 DIAGNOSIS — Z7901 Long term (current) use of anticoagulants: Secondary | ICD-10-CM

## 2020-05-19 DIAGNOSIS — I480 Paroxysmal atrial fibrillation: Secondary | ICD-10-CM | POA: Diagnosis not present

## 2020-05-19 LAB — POCT INR: INR: 2.1 (ref 2.0–3.0)

## 2020-05-19 NOTE — Patient Instructions (Addendum)
Pre visit review using our clinic review tool, if applicable. No additional management support is needed unless otherwise documented below in the visit note.  Continue to take 1 tablet daily  except 1/2 tablet on Wednesdays.  Re-check in 4 weeks. 

## 2020-06-11 ENCOUNTER — Encounter: Payer: Self-pay | Admitting: Nurse Practitioner

## 2020-06-11 ENCOUNTER — Ambulatory Visit: Payer: Medicare HMO | Admitting: Nurse Practitioner

## 2020-06-11 VITALS — BP 110/70 | HR 94 | Ht 73.0 in | Wt 214.0 lb

## 2020-06-11 DIAGNOSIS — R1310 Dysphagia, unspecified: Secondary | ICD-10-CM | POA: Diagnosis not present

## 2020-06-11 NOTE — Patient Instructions (Addendum)
If you are age 79 or older, your body mass index should be between 23-30. Your Body mass index is 28.23 kg/m. If this is out of the aforementioned range listed, please consider follow up with your Primary Care Provider.  If you are age 79 or younger, your body mass index should be between 19-25. Your Body mass index is 28.23 kg/m. If this is out of the aformentioned range listed, please consider follow up with your Primary Care Provider.    You have been scheduled for a Barium Esophogram at [facility] (1st floor of the hospital) on 06/30/20 at 10:30am. Please arrive 15 minutes prior to your appointment for registration. Make certain not to have anything to eat or drink 3 hours prior to your test. If you need to reschedule for any reason, please contact radiology at [phone] to do so. __________________________________________________________________ A barium swallow is an examination that concentrates on views of the esophagus. This tends to be a double contrast exam (barium and two liquids which, when combined, create a gas to distend the wall of the oesophagus) or single contrast (non-ionic iodine based). The study is usually tailored to your symptoms so a good history is essential. Attention is paid during the study to the form, structure and configuration of the esophagus, looking for functional disorders (such as aspiration, dysphagia, achalasia, motility and reflux) EXAMINATION You may be asked to change into a gown, depending on the type of swallow being performed. A radiologist and radiographer will perform the procedure. The radiologist will advise you of the type of contrast selected for your procedure and direct you during the exam. You will be asked to stand, sit or lie in several different positions and to hold a small amount of fluid in your mouth before being asked to swallow while the imaging is performed .In some instances you may be asked to swallow barium coated  marshmallows to assess the motility of a solid food bolus. The exam can be recorded as a digital or video fluoroscopy procedure. POST PROCEDURE It will take 1-2 days for the barium to pass through your system. To facilitate this, it is important, unless otherwise directed, to increase your fluids for the next 24-48hrs and to resume your normal diet.  This test typically takes about 30 minutes to perform. __________________________________________________________________________________  Recommendations for follow-up with oral surgeon or ENT to be discussed further with primary care physician after Barium Swallow Test has been performed.   If you are age 79 or older, your body mass index should be between 23-30. Your Body mass index is 28.23 kg/m. If this is out of the aforementioned range listed, please consider follow up with your Primary Care Provider.  If you are age 79 or younger your body mass index should be between 19-25. Your Body mass index is 28.23 kg/m. If this is out of the aformentioned range listed, please consider follow up with your Primary Care Provider.    Thank you for choosing me and [facility].  [place]

## 2020-06-11 NOTE — Progress Notes (Signed)
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 He presents to our office today as referred by Dr. Caryl [name] for further evaluation for dysphagia. No heartburn. He is accompanied by his wife. He has mild dementia with some memory loss so his wife is assisting with obtaining his history.  He reports having difficulty swallowing solid foods and liquids since he underwent surgery and radiation for his tongue cancer and excision of a large right neck lipoma by Dr. Ezzard Standing on 11/16/2016. He  underwent a modified right neck dissection due to having right neck lymphadenopathy 07/25/2017.  His dysphagia symptoms have progressively worsened over the past 9 to 10 months. He describes feeling as if there is a rock in his throat. He cuts his food into small pieces and chews his food thoroughly. He coughs every time he eats. On one occasion, food got stuck in his throat/upper esophagus and he coughed it out. He coughs during the night time as well. Following his surgery 11/2016 his wife reported he had speech therapy and he was given swallowing exercises to continue which he did not do. He reported losing 120lbs within the first 6 to 12 months following his diagnosis and treatment for tongue cancer. His weight has remained stable for at least the past year. He is passing normal balls of brown stool every other day. No rectal bleeding or black stools. He underwent a colonoscopy in 2005, his wife reported his colonoscopy was normal. No family history of esophageal, gastric or colon cancer.    CBC Latest Ref Rng & Units  11/28/2019 11/06/2019 08/08/2018  WBC 3.4 - 10.8 x10E3/uL 2.7(L) 2.5(LL) 7.4  Hemoglobin 13.0 - 17.7 g/dL 93.2 35.5 73.2  Hematocrit 37.5 - 51.0 % 39.4 41.4 43.5  Platelets 150 - 450 x10E3/uL 180 173 249   CMP Latest Ref Rng & Units 11/28/2019 11/06/2019 10/12/2019  Glucose 65 - 99 mg/dL 202(R) 427(C) 96  BUN 8 - 27 mg/dL 24 22 19   Creatinine 0.76 - 1.27 mg/dL 6.23 7.62 8.31  Sodium 134 - 144 mmol/L 141 141 142  Potassium 3.5 - 5.2 mmol/L 4.5 4.1 4.2  Chloride 96 - 106 mmol/L 100 102 102  CO2 20 - 29 mmol/L 26 23 26   Calcium 8.6 - 10.2 mg/dL 9.0 9.0 9.2  Total Protein 6.5 - 8.1 g/dL - - -  Total Bilirubin 0.3 - 1.2 mg/dL - - -  Alkaline Phos 38 - 126 U/L - - -  AST 15 - 41 U/L - - -  ALT 17 - 63 U/L - - -    ECHO 04/28/2017: - Left ventricle: Systolic function was normal. The estimated  ejection fraction was in the range of 55% to 60%. Wall motion was  normal; there were no regional wall motion abnormalities.  - Left atrium: Severely dilated.  - Right ventricle: The cavity size was normal. Wall thickness was  normal. AICD wire noted in right ventricle. Systolic function was  normal.  - Right atrium: The atrium was mildly dilated. AICD wire noted in  right atrium.  - Atrial septum: No defect or patent foramen ovale was identified.  -  Inferior vena cava: The vessel was normal in size. The  respirophasic diameter changes were in the normal range (>= 50%),  consistent with normal central venous pressure.   Past Medical History:  Diagnosis Date  . AICD (automatic cardioverter/defibrillator) present   . Atrial fibrillation (HCC)   . Cancer (HCC)    tongue cancer  . Cardiomyopathy, nonischemic (HCC) 1998  . CHF (congestive heart failure) (HCC)   . Claustrophobia   . Diabetes mellitus   . Dysrhythmia   . Heart disease   . History of echocardiogram    Echo 8/18:  EF 55-60, no RWMA, severe LAE, normal RVSF, mild RAE  . History of radiation therapy 12/07/16- 01/24/17   Base  of Tongue 70 Gy in 35 fractions  . Hyperlipidemia   . Memory loss   . OSA (obstructive sleep apnea)   . Shingles   . Umbilical hernia   . 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 It sometimes shows as a shadow on scans.    Past Surgical History:  Procedure Laterality Date  . CARDIAC DEFIBRILLATOR PLACEMENT  2006  . CATARACT EXTRACTION Bilateral    01/07/2016. 01/22/2016  . CYST EXCISION     multiple drainage to a cyst in his neck.   Marland Kitchen DIRECT LARYNGOSCOPY N/A 11/16/2016   Procedure: DIRECT LARYNGOSCOPY AND BIOPSY;  Surgeon: [name], MD;  Location: [facility] OR;  Service: ENT;  Laterality: N/A;  . EYE SURGERY    . HERNIA REPAIR  1950s  . IMPLANTABLE CARDIOVERTER DEFIBRILLATOR (ICD) GENERATOR CHANGE N/A 05/10/2014   Procedure: ICD GENERATOR CHANGE;  Surgeon: [name], MD;  Location: [facility] CATH LAB;  Service: Cardiovascular;  Laterality: N/A;  . INSERTION OF MESH N/A 01/18/2018   Procedure: INSERTION OF MESH;  Surgeon: [name], MD;  Location: [facility] OR;  Service: General;  Laterality: N/A;  . IR GASTROSTOMY TUBE MOD SED  12/27/2016  . IR GASTROSTOMY TUBE REMOVAL  03/24/2018  . IR PATIENT EVAL TECH 0-60 MINS  01/17/2017  . MASS EXCISION Right 11/16/2016   Procedure: EXCISION RIGHT NECK LIPOMA;  Surgeon: [name], MD;  Location: [facility] OR;  Service: ENT;  Laterality: Right;  . MOHS SURGERY  01/01/2015   to top of head  . MOUTH SURGERY     teeth removed 4 teeth extracted 02/2010  . RADICAL NECK DISSECTION Right 07/25/2017   Procedure: RIGHT NECK DISSECTION;  Surgeon: [name], MD;  Location: [facility] OR;  Service: ENT;  Laterality: Right;  . UMBILICAL HERNIA REPAIR  01/18/2018   w/mesh  . UMBILICAL HERNIA REPAIR N/A 01/18/2018   Procedure: UMBILICAL HERNIA REPAIR WITH MESH;  Surgeon: [name], MD;  Location: [facility] OR;  Service: General;  Laterality: N/A;   Social History:  Married. Son with heart disease. One son  estranged. Nonsmoker. Rare alcohol intake.   Family History: Mother died in he 53's with history of Lupus, heart disease, sudden death. Father died in his 44's with history of HTN and TIAs.    Allergies  Allergen Reactions  . Niaspan [Niacin Er] Other (See Comments)    FLUSHED      Outpatient Encounter Medications as of 06/11/2020  Medication Sig  . acetaminophen (TYLENOL) 500 MG tablet Take 1,000 mg by mouth daily as needed for moderate pain.   . carvedilol (COREG) 25 MG tablet TAKE ONE TABLET BY MOUTH TWICE A DAY NEEDS APPOINTMENT  .  diltiazem (CARDIZEM) 90 MG tablet TAKE ONE TABLET BY MOUTH DAILY  . donepezil (ARICEPT) 10 MG tablet Take 1 tablet daily  . escitalopram (LEXAPRO) 20 MG tablet Take 1 tablet (20 mg total) by mouth daily.  Marland Kitchen PARoxetine (PAXIL) 10 MG tablet Take 1 tablet (10 mg total) by mouth daily.  . polyethylene glycol (MIRALAX) packet Take 17 g by mouth daily as needed. Available OTC  . potassium chloride (KLOR-CON) 10 MEQ tablet DISSOLVE 1 TABLET IN WATER DAILY AND ADMINSTER VIA PEG TUBE  . rosuvastatin (CRESTOR) 10 MG tablet TAKE ONE TABLET BY MOUTH DAILY  . torsemide (DEMADEX) 20 MG tablet Take 30 mg daily alternating with 20 mg every third day  . warfarin (COUMADIN) 5 MG tablet TAKE ONE TABLET BY MOUTH DAILY OR AS INSTRUCTED BY COUMADIN CLINIC   No facility-administered encounter medications on file as of 06/11/2020.    REVIEW OF SYSTEMS:   Gen: Denies fever, sweats or chills. No weight loss.  CV: Denies chest pain, palpitations or edema. Resp: See HPI.   GI: See HPI.  GU : Decreased urinary flow.  MS: Denies new joint pain or muscle aches. Left ankle deformity.  Derm: Denies rash, itchiness, skin lesions or unhealing ulcers. Psych: + Dementia.  Heme: + easy bruising and bleeding. Neuro:  Denies headaches, dizziness or paresthesias. Endo:  + DM II.   PHYSICAL EXAM: BP 110/70   Pulse 94   Ht 6\' 1"  (1.854 m)   Wt 214 lb (97.1 kg)   BMI 28.23 kg/m    General: Somewhat frail appearing 79 year old male in no acute distress. Head: Normocephalic and atraumatic. Eyes:  Sclerae non-icteric, conjunctive pink. Ears: Normal auditory acuity. Mouth: Dentures intact.  No ulcers or lesions.  Neck: Large right neck scar.  Lungs: Clear bilaterally to auscultation without wheezes, crackles or rhonchi. Diminished in the bases.  Heart: Irregular rate and rhythm. No murmur, rub or gallop appreciated.  Abdomen: Soft, nontender, non distended. No masses. No hepatosplenomegaly. Normoactive bowel sounds x 4 quadrants. Central upper abdominal scar (site of past G tube).  Rectal: Deferred.  Musculoskeletal: Symmetrical with no gross deformities. Skin: Warm and dry. No rash or lesions on visible extremities. Extremities: Bilateral LEs with brawny discoloration. Left ankle 2+ edema.  Neurological: Alert oriented x  3, no focal deficits.  Psychological:  Alert and cooperative. Normal mood and affect.  ASSESSMENT AND PLAN:  42. 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 His dysphagia symptoms have progressively worsened over the past 9 to 10 months.  -Barium swallow with tablet -Further recommendation, consideration for EGD to be determined after Barium swallow results completed -He will most likely benefit from speech therapy re-evaluation and PT -Patient/wife to call our office if his symptoms worsen. He will continue to cut his food into small pieces, chew food thoroughly, avoid steak/dry chicken, bread.  -Recommend follow up with oral surgeon/ENT  2. Colon cancer screening. Reported having a normal colonoscopy in 2005. -Consider  Cologuard Test -Defer decision regarding future colonoscopy to Dr. Orvan Falconer  3. CAD, CHF,CM, AICD,  Afib,   4. Dementia        CC:  [name], MD

## 2020-06-12 NOTE — Progress Notes (Signed)
Reviewed and agree with management plans. ? ?Madeeha Costantino L. Avyanna Spada, MD, MPH  ?

## 2020-06-15 ENCOUNTER — Other Ambulatory Visit: Payer: Self-pay | Admitting: Neurology

## 2020-06-16 ENCOUNTER — Ambulatory Visit (INDEPENDENT_AMBULATORY_CARE_PROVIDER_SITE_OTHER): Payer: Medicare HMO | Admitting: General Practice

## 2020-06-16 ENCOUNTER — Other Ambulatory Visit: Payer: Self-pay

## 2020-06-16 DIAGNOSIS — Z7901 Long term (current) use of anticoagulants: Secondary | ICD-10-CM | POA: Diagnosis not present

## 2020-06-16 DIAGNOSIS — R69 Illness, unspecified: Secondary | ICD-10-CM | POA: Diagnosis not present

## 2020-06-16 LAB — POCT INR: INR: 1.4 — AB (ref 2.0–3.0)

## 2020-06-16 NOTE — Patient Instructions (Addendum)
Pre visit review using our clinic review tool, if applicable. No additional management support is needed unless otherwise documented below in the visit note.  Take 1 1/2 tablets today and tomorrow and 1 tablet on Wednesday.  On Thursday continue to take 1 tablet daily except 1/2 tablet on Wednesdays.  Re-check in 2 weeks.

## 2020-06-17 ENCOUNTER — Ambulatory Visit (INDEPENDENT_AMBULATORY_CARE_PROVIDER_SITE_OTHER): Payer: Medicare HMO

## 2020-06-17 DIAGNOSIS — I428 Other cardiomyopathies: Secondary | ICD-10-CM | POA: Diagnosis not present

## 2020-06-18 LAB — CUP PACEART REMOTE DEVICE CHECK
Battery Remaining Longevity: 126 mo
Battery Remaining Percentage: 100 %
Brady Statistic RV Percent Paced: 18 %
Date Time Interrogation Session: 20211012063600
HighPow Impedance: 40 Ohm
Implantable Lead Implant Date: 20060209
Implantable Lead Location: 753860
Implantable Lead Model: 158
Implantable Lead Serial Number: 156422
Implantable Pulse Generator Implant Date: 20150904
Lead Channel Impedance Value: 349 Ohm
Lead Channel Pacing Threshold Amplitude: 1 V
Lead Channel Pacing Threshold Pulse Width: 0.4 ms
Lead Channel Setting Pacing Amplitude: 2.4 V
Lead Channel Setting Pacing Pulse Width: 0.4 ms
Lead Channel Setting Sensing Sensitivity: 0.5 mV
Pulse Gen Serial Number: 191056

## 2020-06-23 NOTE — Progress Notes (Signed)
Remote ICD transmission.   

## 2020-06-30 ENCOUNTER — Other Ambulatory Visit: Payer: Self-pay

## 2020-06-30 ENCOUNTER — Telehealth: Payer: Self-pay

## 2020-06-30 ENCOUNTER — Ambulatory Visit (HOSPITAL_COMMUNITY)
Admission: RE | Admit: 2020-06-30 | Discharge: 2020-06-30 | Disposition: A | Discharge status: Home / Self Care | Payer: Medicare HMO | Source: Ambulatory Visit | Attending: Nurse Practitioner | Admitting: Nurse Practitioner

## 2020-06-30 DIAGNOSIS — R1310 Dysphagia, unspecified: Secondary | ICD-10-CM | POA: Diagnosis not present

## 2020-06-30 NOTE — Telephone Encounter (Signed)
Malachy Mood with [facility] Radiology called to let Jaclyn Shaggy know they had to halt patient's Swallow Study because of gross aspiration. No esophageal abnormalities were seen in the portion of the study they were able to complete. They suggested a follow-up Speech Pathology eval.

## 2020-07-02 ENCOUNTER — Other Ambulatory Visit: Payer: Self-pay

## 2020-07-02 ENCOUNTER — Ambulatory Visit (INDEPENDENT_AMBULATORY_CARE_PROVIDER_SITE_OTHER): Payer: Medicare HMO | Admitting: General Practice

## 2020-07-02 DIAGNOSIS — I480 Paroxysmal atrial fibrillation: Secondary | ICD-10-CM

## 2020-07-02 DIAGNOSIS — Z7901 Long term (current) use of anticoagulants: Secondary | ICD-10-CM

## 2020-07-02 LAB — POCT INR: INR: 2 (ref 2.0–3.0)

## 2020-07-02 NOTE — Progress Notes (Signed)
I have reviewed the results and agree with this plan   

## 2020-07-02 NOTE — Progress Notes (Signed)
I have reviewed the results and agree with this plan   Dorothyann Peng, NP

## 2020-07-02 NOTE — Patient Instructions (Addendum)
Pre visit review using our clinic review tool, if applicable. No additional management support is needed unless otherwise documented below in the visit note.  Continue to take 1 tablet daily  except 1/2 tablet on Wednesdays.  Re-check in 4 weeks. 

## 2020-07-08 NOTE — Telephone Encounter (Signed)
Dr. Tarri Glenn, refer to office visit 10/6. See failed barium swallow due to evidence of aspiration. Do you want him to undergo a modified swallow study with speech pathologist?

## 2020-07-08 NOTE — Telephone Encounter (Signed)
Evaluation with speech pathology sounds appropriate. Thank you.

## 2020-07-09 ENCOUNTER — Telehealth: Payer: Self-pay | Admitting: Nurse Practitioner

## 2020-07-09 NOTE — Telephone Encounter (Signed)
Patient's wife returned your call about results, please call her one more time.   

## 2020-07-09 NOTE — Telephone Encounter (Signed)
See barium swallow report notes.

## 2020-07-10 ENCOUNTER — Other Ambulatory Visit (HOSPITAL_COMMUNITY): Payer: Self-pay

## 2020-07-10 ENCOUNTER — Other Ambulatory Visit: Payer: Self-pay

## 2020-07-10 DIAGNOSIS — R1310 Dysphagia, unspecified: Secondary | ICD-10-CM

## 2020-07-10 NOTE — Telephone Encounter (Signed)
Please see imaging results

## 2020-07-16 ENCOUNTER — Ambulatory Visit (HOSPITAL_COMMUNITY)
Admission: RE | Admit: 2020-07-16 | Discharge: 2020-07-16 | Disposition: A | Discharge status: Home / Self Care | Payer: Medicare HMO | Source: Ambulatory Visit | Attending: Nurse Practitioner | Admitting: Nurse Practitioner

## 2020-07-16 ENCOUNTER — Other Ambulatory Visit: Payer: Self-pay

## 2020-07-16 DIAGNOSIS — R1310 Dysphagia, unspecified: Secondary | ICD-10-CM | POA: Insufficient documentation

## 2020-07-16 DIAGNOSIS — T17300A Unspecified foreign body in larynx causing asphyxiation, initial encounter: Secondary | ICD-10-CM | POA: Diagnosis not present

## 2020-07-16 NOTE — Progress Notes (Addendum)
Modified Barium Swallow Progress Note  Patient Details  Name: Collin [name] MRN: [id number] Date of Birth: [birth year]  Today's Date: 07/16/2020  Modified Barium Swallow completed.  Full report located under Chart Review in the Imaging Section.  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 Pt presents with iatrogenic oropharyngeal dysphagia, s/p radiation tx for lingual cancer, characterized by reduced bolus cohesion, a pharyngeal delay, reduced lingual retraction, and reduced anterior laryngeal moevement. He demonstrated premature spillage to the pyriform sinuses and the swallow was often triggered with the head of the liquid bolus at the level of the pyriform sinuses. He exhibited mild vallecular residue and trace pyriform sinus residue. Penetration (PAS 3, 4)  was noted with thin liquids via straw and was improved to PAS 2 with nectar thick liquids and thin liquids via cup. Instances of penetration to the vocal folds triggered spontaneous throat clearing which eliminated the penetrant. Penetration was limited with use of compensatory strategies including effortful swallows, reduced bolus sizes via cup, and use of a mendelsohn maneuver. No instances of aspiration were observed during the study, but the pharyngeal delay and pharyngeal residue do place him at increased risk of aspiration if swallowing precautions are not observed. It is recommended that the pt continue intake of regular texture solids and thin liquids with observance of the noted swallowing precautions/compensatory strategies. Pt and his wife were educated regarding results and recommendations; understanding and agreement were verbalized.    Swallow Evaluation Recommendations       SLP Diet Recommendations: Regular solids;Thin liquid   Liquid Administration via: Cup;No straw    Medication Administration: Whole meds with puree (or  with thin liquids if individual sips are used)   Supervision: Patient able to self feed   Compensations: Slow rate;Small sips/bites;Effortful swallow, individual sips only; avoid consecutive swallows of thin liquids   Postural Changes: Remain semi-upright after after feeds/meals (Comment);Seated upright at 90 degrees   Oral Care Recommendations: Oral care BID       Angeliah Wisdom I. Hardin Negus, [place], [place] Office number [phone] Pager [place] 07/16/2020,2:08 PM

## 2020-07-19 ENCOUNTER — Other Ambulatory Visit: Payer: Self-pay | Admitting: Family Medicine

## 2020-08-06 ENCOUNTER — Ambulatory Visit (INDEPENDENT_AMBULATORY_CARE_PROVIDER_SITE_OTHER): Payer: Medicare HMO | Admitting: General Practice

## 2020-08-06 DIAGNOSIS — Z7901 Long term (current) use of anticoagulants: Secondary | ICD-10-CM

## 2020-08-06 DIAGNOSIS — I480 Paroxysmal atrial fibrillation: Secondary | ICD-10-CM | POA: Diagnosis not present

## 2020-08-06 LAB — POCT INR: INR: 2.1 (ref 2.0–3.0)

## 2020-08-06 NOTE — Patient Instructions (Signed)
Pre visit review using our clinic review tool, if applicable. No additional management support is needed unless otherwise documented below in the visit note.  Continue to take 1 tablet daily  except 1/2 tablet on Wednesdays.  Re-check in 4 weeks. 

## 2020-09-02 ENCOUNTER — Other Ambulatory Visit: Payer: Self-pay

## 2020-09-03 ENCOUNTER — Ambulatory Visit (INDEPENDENT_AMBULATORY_CARE_PROVIDER_SITE_OTHER): Payer: Medicare HMO | Admitting: General Practice

## 2020-09-03 DIAGNOSIS — Z7901 Long term (current) use of anticoagulants: Secondary | ICD-10-CM

## 2020-09-03 DIAGNOSIS — I480 Paroxysmal atrial fibrillation: Secondary | ICD-10-CM

## 2020-09-03 LAB — POCT INR: INR: 2.1 (ref 2.0–3.0)

## 2020-09-03 NOTE — Patient Instructions (Signed)
Pre visit review using our clinic review tool, if applicable. No additional management support is needed unless otherwise documented below in the visit note. ° °Continue to take 1 tablet daily  except 1/2 tablet on Wednesdays.  Re-check in 6 weeks. °

## 2020-09-09 ENCOUNTER — Other Ambulatory Visit: Payer: Self-pay | Admitting: Internal Medicine

## 2020-09-10 ENCOUNTER — Telehealth: Payer: Self-pay | Admitting: Family Medicine

## 2020-09-10 NOTE — Telephone Encounter (Signed)
Left message for patient to call back and schedule Medicare Annual Wellness Visit (AWV) either virtually or in office.   Last AWV no information please schedule at anytime with [facility] Nurse Health Advisor 1 or 2   This should be a 45 minute visit. 

## 2020-09-16 ENCOUNTER — Ambulatory Visit (INDEPENDENT_AMBULATORY_CARE_PROVIDER_SITE_OTHER): Payer: Medicare HMO

## 2020-09-16 DIAGNOSIS — I428 Other cardiomyopathies: Secondary | ICD-10-CM

## 2020-09-17 LAB — CUP PACEART REMOTE DEVICE CHECK
Battery Remaining Longevity: 120 mo
Battery Remaining Percentage: 100 %
Brady Statistic RV Percent Paced: 18 %
Date Time Interrogation Session: 20220111074100
HighPow Impedance: 41 Ohm
Implantable Lead Implant Date: 20060209
Implantable Lead Location: 753860
Implantable Lead Model: 158
Implantable Lead Serial Number: 156422
Implantable Pulse Generator Implant Date: 20150904
Lead Channel Impedance Value: 365 Ohm
Lead Channel Pacing Threshold Amplitude: 1 V
Lead Channel Pacing Threshold Pulse Width: 0.4 ms
Lead Channel Setting Pacing Amplitude: 2.4 V
Lead Channel Setting Pacing Pulse Width: 0.4 ms
Lead Channel Setting Sensing Sensitivity: 0.5 mV
Pulse Gen Serial Number: 191056

## 2020-09-18 DIAGNOSIS — I429 Cardiomyopathy, unspecified: Secondary | ICD-10-CM | POA: Diagnosis not present

## 2020-09-18 DIAGNOSIS — E785 Hyperlipidemia, unspecified: Secondary | ICD-10-CM | POA: Diagnosis not present

## 2020-09-18 DIAGNOSIS — I739 Peripheral vascular disease, unspecified: Secondary | ICD-10-CM | POA: Diagnosis not present

## 2020-09-18 DIAGNOSIS — Z008 Encounter for other general examination: Secondary | ICD-10-CM | POA: Diagnosis not present

## 2020-09-18 DIAGNOSIS — I4891 Unspecified atrial fibrillation: Secondary | ICD-10-CM | POA: Diagnosis not present

## 2020-09-18 DIAGNOSIS — R69 Illness, unspecified: Secondary | ICD-10-CM | POA: Diagnosis not present

## 2020-09-18 DIAGNOSIS — E663 Overweight: Secondary | ICD-10-CM | POA: Diagnosis not present

## 2020-09-18 DIAGNOSIS — I509 Heart failure, unspecified: Secondary | ICD-10-CM | POA: Diagnosis not present

## 2020-09-18 DIAGNOSIS — I700 Atherosclerosis of aorta: Secondary | ICD-10-CM | POA: Diagnosis not present

## 2020-09-18 DIAGNOSIS — I110 Hypertensive heart disease with heart failure: Secondary | ICD-10-CM | POA: Diagnosis not present

## 2020-09-22 ENCOUNTER — Encounter: Payer: Self-pay | Admitting: Neurology

## 2020-09-22 ENCOUNTER — Telehealth (INDEPENDENT_AMBULATORY_CARE_PROVIDER_SITE_OTHER): Payer: Medicare HMO | Admitting: Neurology

## 2020-09-22 ENCOUNTER — Other Ambulatory Visit: Payer: Self-pay

## 2020-09-22 VITALS — Ht 72.0 in | Wt 210.0 lb

## 2020-09-22 DIAGNOSIS — F0391 Unspecified dementia with behavioral disturbance: Secondary | ICD-10-CM | POA: Diagnosis not present

## 2020-09-22 DIAGNOSIS — F03B18 Unspecified dementia, moderate, with other behavioral disturbance: Secondary | ICD-10-CM

## 2020-09-22 DIAGNOSIS — R69 Illness, unspecified: Secondary | ICD-10-CM | POA: Diagnosis not present

## 2020-09-22 MED ORDER — MEMANTINE HCL 10 MG PO TABS: ORAL_TABLET | ORAL | 11 refills | Status: DC

## 2020-09-22 MED ORDER — DONEPEZIL HCL 10 MG PO TABS
ORAL_TABLET | ORAL | 0 refills | Status: DC
Start: 2020-09-22 — End: 2020-12-30

## 2020-09-22 MED ORDER — PAROXETINE HCL 20 MG PO TABS: 20.0000 mg | ORAL_TABLET | Freq: Every day | ORAL | 3 refills | Status: DC

## 2020-09-22 NOTE — Progress Notes (Signed)
Telephone (Audio) Visit The purpose of this telephone visit is to provide medical care while limiting exposure to the novel coronavirus.    Consent was obtained for telephone visit:  Yes.   Answered questions that patient had about telehealth interaction:  Yes.   I discussed the limitations, risks, security and privacy concerns of performing an evaluation and management service by telephone. I also discussed with the patient that there may be a patient responsible charge related to this service. The patient expressed understanding and agreed to proceed.  Pt location: Home Physician Location: office Name of referring provider:  [name], MD I connected with .Collin [name] at patients initiation/request on 09/22/2020 at  3:30 PM EST by telephone and verified that I am speaking with the correct person using two identifiers.  Pt MRN:  [id number] Pt DOB:  [birth year]   History of Present Illness:  The patient had a telephone visit on 09/22/2020. Unable to connect via video due to technical difficulties. He was last seen in the neurology clinic 7 months ago for moderate dementia with behavioral disturbance, likely due to Alzheimer's disease. MMSE 15/30 in 02/2020. His wife asked to speak to me separately. She reports his short-term memory is almost gone. He does not recall anything new. He watches TV but she does not think he comprehends what is going on, asking about what is happening on shows they have watched for years. He does not recognize faces, he did not remember who his house visitors were. He is always angry at his wife. He still hoards quite a bit, she thinks he is still stealing things even if she does not see him do it. He does not have money or a credit card, but small things would appear in their house such as toy planes and cars. He is always complaining there is nowhere to go, his wife states she does not know what to do due to financial issues. She has contacted the [place]. He  is not interested in doing activities or physical therapy home exercises. He was complaining of hearing loss, she got hearing aides but he refuses to wear them. On his last visit, Lexapro was switched to Paroxetine 10mg  daily. He is also on Donepezil 10mg  daily. No side effects. He sleeps well at night, but also sleeps a lot during the day. He does not snore. No hallucinations. He states his memory is "as long as I can remember, been short." He denies any headaches, focal numbness/tingling/weakness. He feels dizzy/off center once in a while and reports a fall recently, he stumbles easily and has to know exactly where to put his feet. He states he uses a cane for balance.    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 He started noticing changes around a year ago, soon after diagnosis of cancer. He describes his memory as "terrible," he would go upstairs and stop in the hallway forgetting what he went for. He could not find his eyeglasses and got very frustrated, berating himself for 40 minutes, calling himself stupid. His wife reports minor memory issues for a few years, but more noticeable in the past year. He used to manage his own medications, but at that time she would find full bottles of medications that should  have been empty. She took over medications and now fixes his pills weekly. He states he does not drive as much and denies getting lost driving, his wife corrects him and reminds him he stopped driving November 2017 when he was having episodes with his ICD and was told not to drive. She feels he would have gotten lost, she always gives him instructions driving and he would panic if he did not know where he was. The other day at  home he could not find their drinking glasses and was opening all the cabinets in the kitchen. He repeats stories and questions. He is independent with dressing and bathing, but his wife is never sure if he brushes his teeth, he gets mad if she reminds him. She states "he is a good actor." She reports he is friendly to everyone, talking to strangers, but once they get into the car, "he is a grump, angry old man," taking it out on her all the time. She reports since the cancer diagnosis, things have been a lot worse, she thinks he is depressed. He states he is frustrated because he cannot do a lot of things he used to do.  He gets dizzy a lot when standing and has to get his bearings first. He has missteps and falls, he fell yesterday and had abrasions on his left arm. His vision is blurred. He has had swallowing difficulties since the surgery and radiation. He has a feeding tube but mostly is able to swallow by mouth now. No neck pain, some back discomfort. He gets left arm numbness when sitting for a long time. He has swelling on his left ankle, his wife reports it is "collapsed" for years, but he has never been evaluated for it. He has constipation, no incontinence, no anosmia or tremors. His father had memory issues. No history of significant head injuries or alcohol use. He finished up to the 9th grade.  Diagnostic Data:  I personally reviewed head CT with and without contrast done July 2019 which did not show any acute changes, there was mild diffuse atrophy. TSH and B12 normal.     Observations/Objective:   Vitals:   09/22/20 1504  Weight: 210 lb (95.3 kg)  Height: 6' (1.829 m)   Exam limited due to nature of phone visit. Patient is awake, alert, oriented to person, knows his wife's name. Does not know month/year, address. Knows it is winter. He did not want to spell WORLD. 3/3 delayed recall. He did not know how many children he has.   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 MMSE 15/30 in 02/2020. Discussed diagnosis and prognosis with wife. Discussed starting Memantine, this may help with behaviors, side effects discussed. Start Memantine 10mg  daily for 2 weeks, then increase to 10mg  BID. Continue Donepezil 10mg  daily. He is always angry and having compulsive behaviors, increase Paroxetine to 20mg  daily. Continue 24/7 care, he does not drive. Caregiver support provided, wife was encouraged to contact the navigator with [facility] and look into day programs for him. Follow-up in 6 months, they know to call for any changes.    Follow Up Instructions:   -I discussed the assessment and treatment plan with the patient. The patient was provided an opportunity to ask questions and all were  answered. The patient agreed with the plan and demonstrated an understanding of the instructions.   The patient was advised to call back or seek an in-person evaluation if the symptoms worsen or if the condition fails to improve as anticipated.    Total Time spent in visit with the patient was:  27 minutes, of which 100% of the time was spent in counseling and/or coordinating care on the above.   Pt understands and agrees with the plan of care outlined.     Van Clines, MD

## 2020-09-25 ENCOUNTER — Encounter: Payer: Self-pay | Admitting: Physical Therapy

## 2020-09-25 NOTE — Therapy (Signed)
[facility] [address] [place], [place], [address] Phone: [phone]   Fax:  [phone]  Patient Details  Name: Collin [name] MRN: [id number] Date of Birth: 06/17/1941 Referring Provider:  Dr. Dorris Carnes Encounter Date: 09/25/2020   PHYSICAL THERAPY DISCHARGE SUMMARY  Visits from Start of Care: 9  Current functional level related to goals / functional outcomes:  Pt's last day of PT was 09-19-19       PT Long Term Goals - 09/17/19 2003            PT LONG TERM GOAL #1   Title  Pt will increase Berg balance test score by at least 5 points for reduced fall risk.    Baseline  score decreased from 47/56 on 08-23-19 to 46/56 as tested today on 09-17-19    Time  4    Period  Weeks    Status  Not Met        PT LONG TERM GOAL #2   Title  Improve TUG score from 19.35 secs to </= 14.5 secs with no device to demo increased functional mobility with reduced fall risk.    Baseline  16.97 secs with no device on 09-17-19    Time  4    Period  Weeks    Status  Partially Met        PT LONG TERM GOAL #3   Title  Increase gait velocity from 1.88 ft/sec to >/= 2.1 ft/sec with no device for incr. gait efficiency.    Baseline  17.42 secs = 1.88 ft/sec    Time  4    Period  Weeks    Status  New        PT LONG TERM GOAL #4   Title  Pt will be independent in HEP for balance exercises.    Baseline  met 09-17-19    Time  4    Period  Weeks    Status  Achieved       Remaining deficits: Continued decreased balance and high level gait   Education / Equipment: Pt was instructed in HEP for balance and strengthening Plan: Patient agrees to discharge.  Patient goals were partially met. Patient is being discharged due to being pleased with the current functional level.  ?????         FRTMYT, RZNBV APOLIDC, PT 09/25/2020, 8:22 AM  [facility] [address] [place], [place], [address] Phone: [phone]   Fax:  [phone]

## 2020-09-30 ENCOUNTER — Telehealth: Payer: Self-pay | Admitting: Family Medicine

## 2020-09-30 NOTE — Telephone Encounter (Signed)
Collin [name] is calling in w/[facility] stating that they received a report but was missing a current medication list and would like to see if it can be faxed to [phone] (F)

## 2020-09-30 NOTE — Telephone Encounter (Signed)
Medication list faxed.

## 2020-10-02 NOTE — Progress Notes (Signed)
Remote ICD transmission.   

## 2020-10-08 NOTE — Progress Notes (Unsigned)
Cardiology Office Note   Date:  10/10/2020   ID:  Collin [name], DOB February 07, 1941, MRN [id number]  PCP:  [name], MD  Cardiologist:   Dietrich Pates, MD   F/u of NICM  and PAF   History of Present Illness: Collin [name] is a 80 y.o. male with a history of  NICM  S/p ICD, Last echo in 2018 LVEF improved to 55 to 60%.   Pt also has a history of atiral fib ( on coumadin), HL, DM, OSA   Pt also with a hx of unsteadiness and falls   I saw the pt in Nov 2020 Pt denies CP  Breathng is OK  No signif edema The pt has signif arthritis   He says he is unsteady on feet   Has to watch what he does      Current Meds  Medication Sig  . acetaminophen (TYLENOL) 500 MG tablet Take 1,000 mg by mouth daily as needed for moderate pain.   . carvedilol (COREG) 25 MG tablet TAKE ONE TABLET BY MOUTH TWICE A DAY NEEDS APPOINTMENT  . diltiazem (CARDIZEM) 90 MG tablet TAKE ONE TABLET BY MOUTH DAILY  . donepezil (ARICEPT) 10 MG tablet TAKE ONE TABLET BY MOUTH DAILY  . memantine (NAMENDA) 10 MG tablet Take 1 tablet every day for 2 weeks, then increase to 1 tablet twice a day and continue  . PARoxetine (PAXIL) 20 MG tablet Take 1 tablet (20 mg total) by mouth daily.  . polyethylene glycol (MIRALAX) packet Take 17 g by mouth daily as needed. Available OTC  . potassium chloride (KLOR-CON) 10 MEQ tablet DISSOLVE 1 TABLET IN WATER AND ADMINISTER VIA PEG TUBE. Please make overdue appt with Dr. Tenny Craw before anymore refills. Thank you 1st attempt  . rosuvastatin (CRESTOR) 10 MG tablet Take 1 tablet (10 mg total) by mouth daily. Please make overdue appt with Dr. Tenny Craw before anymore refills. Thank you 1st attempt  . torsemide (DEMADEX) 20 MG tablet Take 30 mg daily alternating with 20 mg every third day  . warfarin (COUMADIN) 5 MG tablet TAKE 1 TABLET BY MOUTH DAILY OR AS INSTRUCTED BY COUMADIN CLINIC     Allergies:   Niaspan [niacin er]   Past Medical History:  Diagnosis Date  . AICD (automatic  cardioverter/defibrillator) present   . Atrial fibrillation (HCC)   . Cancer (HCC)    tongue cancer  . Cardiomyopathy, nonischemic (HCC) 1998  . CHF (congestive heart failure) (HCC)   . Claustrophobia   . Diabetes mellitus   . Dysrhythmia   . Heart disease   . History of echocardiogram    Echo 8/18:  EF 55-60, no RWMA, severe LAE, normal RVSF, mild RAE  . History of radiation therapy 12/07/16- 01/24/17   Base of Tongue 70 Gy in 35 fractions  . Hyperlipidemia   . Memory loss   . OSA (obstructive sleep apnea)   . Shingles   . Umbilical hernia   . 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 It sometimes shows as a shadow on scans.     Past Surgical History:  Procedure Laterality Date  . CARDIAC DEFIBRILLATOR PLACEMENT  2006  . CATARACT EXTRACTION Bilateral    01/07/2016. 01/22/2016  . CYST EXCISION     multiple drainage to a cyst in his neck.   Marland Kitchen DIRECT LARYNGOSCOPY N/A 11/16/2016   Procedure: DIRECT LARYNGOSCOPY AND BIOPSY;  Surgeon: Cristal Deer  Braxton Feathers, MD;  Location: [facility] OR;  Service: ENT;  Laterality: N/A;  . EYE SURGERY    . HERNIA REPAIR  1950s  . IMPLANTABLE CARDIOVERTER DEFIBRILLATOR (ICD) GENERATOR CHANGE N/A 05/10/2014   Procedure: ICD GENERATOR CHANGE;  Surgeon: [name], MD;  Location: [facility] CATH LAB;  Service: Cardiovascular;  Laterality: N/A;  . INSERTION OF MESH N/A 01/18/2018   Procedure: INSERTION OF MESH;  Surgeon: [name], MD;  Location: [facility] OR;  Service: General;  Laterality: N/A;  . IR GASTROSTOMY TUBE MOD SED  12/27/2016  . IR GASTROSTOMY TUBE REMOVAL  03/24/2018  . IR PATIENT EVAL TECH 0-60 MINS  01/17/2017  . MASS EXCISION Right 11/16/2016   Procedure: EXCISION RIGHT NECK LIPOMA;  Surgeon: [name], MD;  Location: [facility] OR;  Service: ENT;  Laterality: Right;  . MOHS SURGERY  01/01/2015   to top of head  . MOUTH SURGERY     teeth removed 4 teeth extracted 02/2010  . RADICAL NECK DISSECTION Right  07/25/2017   Procedure: RIGHT NECK DISSECTION;  Surgeon: [name], MD;  Location: [facility] OR;  Service: ENT;  Laterality: Right;  . UMBILICAL HERNIA REPAIR  01/18/2018   w/mesh  . UMBILICAL HERNIA REPAIR N/A 01/18/2018   Procedure: UMBILICAL HERNIA REPAIR WITH MESH;  Surgeon: [name], MD;  Location: [facility] OR;  Service: General;  Laterality: N/A;     Social History:  The patient  reports that he has never smoked. He has never used smokeless tobacco. He reports current alcohol use. He reports that he does not use drugs.   Family History:  The patient's family history includes Heart disease in his mother; Hypertension in his father; Lupus in his mother; Stroke in his father; Sudden death in his mother.    ROS:  Please see the history of present illness. All other systems are reviewed and  Negative to the above problem except as noted.    PHYSICAL EXAM: VS:  BP 100/60   Pulse 84   Ht 6' (1.829 m)   Wt 213 lb 12.8 oz (97 kg)   SpO2 96%   BMI 29.00 kg/m   GEN: Well nourished, well developed, in no acute distress  HEENT: normal  Neck: no JVD Cardiac: RRR; no murmurs  No LE edema  Respiratory:  clear to auscultation bilaterally,  GI: soft, nontender, nondistended, + BS  No hepatomegaly  MS: Feet canted out   Some joints with signs of arthritis   Skin: warm and dry, no rash Neuro:  Strength and sensation are intact Psych: euthymic mood, full affect   EKG:  EKG is ordered today.  Afib   IWMI  Low voltage     Lipid Panel    Component Value Date/Time   CHOL 136 07/27/2019 1032   TRIG 122 07/27/2019 1032   HDL 75 07/27/2019 1032   CHOLHDL 1.8 07/27/2019 1032   CHOLHDL 5 11/13/2015 1525   VLDL 73.0 (H) 10/04/2014 0931   LDLCALC 40 07/27/2019 1032   LDLDIRECT 72.0 11/13/2015 1525      Wt Readings from Last 3 Encounters:  10/10/20 213 lb 12.8 oz (97 kg)  09/22/20 210 lb (95.3 kg)  06/11/20 214 lb (97.1 kg)      ASSESSMENT AND PLAN:  1.  History of nonischemic  cardiomyopathy.  Last echo in 2018 LVEF had normalized   Volume is OK on exam   Follow    2PAF.  He is in afib today  Rates OK  Keep on coumadin   Check CBC   3 Falling.  Still unsteady   Wife says they went to [facility]    Did not benefit    5  Hx DM  Check an A1c again     6  HL  Check lipids     F/U in 1 year     F/U in 9 months      Current medicines are reviewed at length with the patient today.  The patient does not have concerns regarding medicines.  Signed, Dietrich Pates, MD  10/10/2020 4:02 PM    [facility] [address] [place], [place], [place]  [address] Phone: [phone]; Fax: [phone]

## 2020-10-10 ENCOUNTER — Encounter: Payer: Self-pay | Admitting: Internal Medicine

## 2020-10-10 ENCOUNTER — Other Ambulatory Visit: Payer: Self-pay

## 2020-10-10 ENCOUNTER — Ambulatory Visit: Payer: Medicare HMO | Admitting: Internal Medicine

## 2020-10-10 VITALS — BP 100/60 | HR 84 | Ht 72.0 in | Wt 213.8 lb

## 2020-10-10 DIAGNOSIS — I509 Heart failure, unspecified: Secondary | ICD-10-CM

## 2020-10-10 DIAGNOSIS — E119 Type 2 diabetes mellitus without complications: Secondary | ICD-10-CM | POA: Diagnosis not present

## 2020-10-10 DIAGNOSIS — E559 Vitamin D deficiency, unspecified: Secondary | ICD-10-CM | POA: Diagnosis not present

## 2020-10-10 DIAGNOSIS — G4733 Obstructive sleep apnea (adult) (pediatric): Secondary | ICD-10-CM

## 2020-10-10 DIAGNOSIS — I10 Essential (primary) hypertension: Secondary | ICD-10-CM | POA: Diagnosis not present

## 2020-10-10 DIAGNOSIS — I480 Paroxysmal atrial fibrillation: Secondary | ICD-10-CM | POA: Diagnosis not present

## 2020-10-10 DIAGNOSIS — I428 Other cardiomyopathies: Secondary | ICD-10-CM | POA: Diagnosis not present

## 2020-10-10 NOTE — Patient Instructions (Signed)
Medication Instructions:  Your physician recommends that you continue on your current medications as directed. Please refer to the Current Medication list given to you today.  *If you need a refill on your cardiac medications before your next appointment, please call your pharmacy*   Lab Work: TODAY: Lipid, BMET, CBC, A1C, TSH  If you have labs (blood work) drawn today and your tests are completely normal, you will receive your results only by: Marland Kitchen MyChart Message (if you have MyChart) OR . A paper copy in the mail If you have any lab test that is abnormal or we need to change your treatment, we will call you to review the results.   Follow-Up: At [facility], you and your health needs are our priority.  As part of our continuing mission to provide you with exceptional heart care, we have created designated Provider Care Teams.  These Care Teams include your primary Cardiologist (physician) and Advanced Practice Providers (APPs -  Physician Assistants and Nurse Practitioners) who all work together to provide you with the care you need, when you need it.  Your next appointment:   1 year(s)  The format for your next appointment:   In Person  Provider:   You may see Dorris Carnes, MD or one of the following Advanced Practice Providers on your designated Care Team:    [name], PA-C  Robbie Lis, [place]  Other Instructions Check your blood pressure at home consistently

## 2020-10-11 LAB — HEMOGLOBIN A1C
Est. average glucose Bld gHb Est-mCnc: 123 mg/dL
Hgb A1c MFr Bld: 5.9 % — ABNORMAL HIGH (ref 4.8–5.6)

## 2020-10-11 LAB — BASIC METABOLIC PANEL
BUN/Creatinine Ratio: 20 (ref 10–24)
BUN: 24 mg/dL (ref 8–27)
CO2: 30 mmol/L — ABNORMAL HIGH (ref 20–29)
Calcium: 9.5 mg/dL (ref 8.6–10.2)
Chloride: 100 mmol/L (ref 96–106)
Creatinine, Ser: 1.23 mg/dL (ref 0.76–1.27)
GFR calc Af Amer: 64 mL/min/{1.73_m2} (ref >59)
GFR calc non Af Amer: 55 mL/min/{1.73_m2} — ABNORMAL LOW (ref >59)
Glucose: 101 mg/dL — ABNORMAL HIGH (ref 65–99)
Potassium: 4.5 mmol/L (ref 3.5–5.2)
Sodium: 146 mmol/L — ABNORMAL HIGH (ref 134–144)

## 2020-10-11 LAB — LIPID PANEL
Chol/HDL Ratio: 2.2 ratio (ref 0.0–5.0)
Cholesterol, Total: 161 mg/dL (ref 100–199)
HDL: 74 mg/dL (ref >39)
LDL Chol Calc (NIH): 59 mg/dL (ref 0–99)
Triglycerides: 175 mg/dL — ABNORMAL HIGH (ref 0–149)
VLDL Cholesterol Cal: 28 mg/dL (ref 5–40)

## 2020-10-11 LAB — TSH: TSH: 2.49 u[IU]/mL (ref 0.450–4.500)

## 2020-10-11 LAB — CBC
Hematocrit: 43.1 % (ref 37.5–51.0)
Hemoglobin: 14.4 g/dL (ref 13.0–17.7)
MCH: 30.3 pg (ref 26.6–33.0)
MCHC: 33.4 g/dL (ref 31.5–35.7)
MCV: 91 fL (ref 79–97)
Platelets: 219 10*3/uL (ref 150–450)
RBC: 4.75 x10E6/uL (ref 4.14–5.80)
RDW: 13.7 % (ref 11.6–15.4)
WBC: 4.2 10*3/uL (ref 3.4–10.8)

## 2020-10-13 ENCOUNTER — Other Ambulatory Visit: Payer: Self-pay | Admitting: Internal Medicine

## 2020-10-15 ENCOUNTER — Ambulatory Visit (INDEPENDENT_AMBULATORY_CARE_PROVIDER_SITE_OTHER): Payer: Medicare HMO | Admitting: General Practice

## 2020-10-15 ENCOUNTER — Other Ambulatory Visit: Payer: Self-pay

## 2020-10-15 ENCOUNTER — Telehealth: Payer: Self-pay | Admitting: Licensed Clinical Social Worker

## 2020-10-15 DIAGNOSIS — Z7901 Long term (current) use of anticoagulants: Secondary | ICD-10-CM

## 2020-10-15 DIAGNOSIS — I480 Paroxysmal atrial fibrillation: Secondary | ICD-10-CM

## 2020-10-15 LAB — POCT INR: INR: 2 (ref 2.0–3.0)

## 2020-10-15 NOTE — Patient Instructions (Addendum)
Pre visit review using our clinic review tool, if applicable. No additional management support is needed unless otherwise documented below in the visit note. ° °Continue to take 1 tablet daily  except 1/2 tablet on Wednesdays.  Re-check in 6 weeks. °

## 2020-10-15 NOTE — Telephone Encounter (Signed)
CSW received referral to assist patient's wife with some respite services. CSW contacted patient's home and message left for return call. Raquel Sarna, [place], [place]

## 2020-11-04 ENCOUNTER — Other Ambulatory Visit: Payer: Self-pay | Admitting: Family Medicine

## 2020-11-10 ENCOUNTER — Encounter (INDEPENDENT_AMBULATORY_CARE_PROVIDER_SITE_OTHER): Payer: Medicare HMO | Admitting: Ophthalmology

## 2020-11-11 ENCOUNTER — Other Ambulatory Visit: Payer: Self-pay

## 2020-11-11 ENCOUNTER — Encounter (INDEPENDENT_AMBULATORY_CARE_PROVIDER_SITE_OTHER): Payer: Medicare HMO | Admitting: Ophthalmology

## 2020-11-11 DIAGNOSIS — H35373 Puckering of macula, bilateral: Secondary | ICD-10-CM

## 2020-11-11 DIAGNOSIS — I10 Essential (primary) hypertension: Secondary | ICD-10-CM

## 2020-11-11 DIAGNOSIS — H35033 Hypertensive retinopathy, bilateral: Secondary | ICD-10-CM

## 2020-11-11 DIAGNOSIS — H43813 Vitreous degeneration, bilateral: Secondary | ICD-10-CM | POA: Diagnosis not present

## 2020-11-18 ENCOUNTER — Other Ambulatory Visit: Payer: Self-pay | Admitting: Family Medicine

## 2020-11-18 ENCOUNTER — Other Ambulatory Visit: Payer: Self-pay | Admitting: Internal Medicine

## 2020-11-26 ENCOUNTER — Ambulatory Visit (INDEPENDENT_AMBULATORY_CARE_PROVIDER_SITE_OTHER): Payer: Medicare HMO | Admitting: General Practice

## 2020-11-26 ENCOUNTER — Ambulatory Visit: Payer: Medicare HMO

## 2020-11-26 ENCOUNTER — Other Ambulatory Visit: Payer: Self-pay

## 2020-11-26 DIAGNOSIS — I480 Paroxysmal atrial fibrillation: Secondary | ICD-10-CM | POA: Diagnosis not present

## 2020-11-26 DIAGNOSIS — Z7901 Long term (current) use of anticoagulants: Secondary | ICD-10-CM

## 2020-11-26 LAB — POCT INR: INR: 3 (ref 2.0–3.0)

## 2020-11-26 NOTE — Patient Instructions (Addendum)
Pre visit review using our clinic review tool, if applicable. No additional management support is needed unless otherwise documented below in the visit note.  Skip dosage today and then continue to take 1 tablet daily except 1/2 tablet on Wednesdays.  Re-check in 6 weeks.

## 2020-12-16 ENCOUNTER — Ambulatory Visit (INDEPENDENT_AMBULATORY_CARE_PROVIDER_SITE_OTHER): Payer: Medicare HMO

## 2020-12-16 DIAGNOSIS — I428 Other cardiomyopathies: Secondary | ICD-10-CM | POA: Diagnosis not present

## 2020-12-16 LAB — CUP PACEART REMOTE DEVICE CHECK
Battery Remaining Longevity: 114 mo
Battery Remaining Percentage: 100 %
Brady Statistic RV Percent Paced: 19 %
Date Time Interrogation Session: 20220412034700
HighPow Impedance: 42 Ohm
Implantable Lead Implant Date: 20060209
Implantable Lead Location: 753860
Implantable Lead Model: 158
Implantable Lead Serial Number: 156422
Implantable Pulse Generator Implant Date: 20150904
Lead Channel Impedance Value: 381 Ohm
Lead Channel Pacing Threshold Amplitude: 1 V
Lead Channel Pacing Threshold Pulse Width: 0.4 ms
Lead Channel Setting Pacing Amplitude: 2.4 V
Lead Channel Setting Pacing Pulse Width: 0.4 ms
Lead Channel Setting Sensing Sensitivity: 0.5 mV
Pulse Gen Serial Number: 191056

## 2020-12-29 ENCOUNTER — Other Ambulatory Visit: Payer: Self-pay | Admitting: Neurology

## 2020-12-31 NOTE — Progress Notes (Signed)
Remote ICD transmission.   

## 2021-01-07 ENCOUNTER — Other Ambulatory Visit: Payer: Self-pay

## 2021-01-07 ENCOUNTER — Ambulatory Visit (INDEPENDENT_AMBULATORY_CARE_PROVIDER_SITE_OTHER): Payer: Medicare HMO | Admitting: General Practice

## 2021-01-07 DIAGNOSIS — Z7901 Long term (current) use of anticoagulants: Secondary | ICD-10-CM

## 2021-01-07 DIAGNOSIS — I480 Paroxysmal atrial fibrillation: Secondary | ICD-10-CM

## 2021-01-07 LAB — POCT INR: INR: 2.3 (ref 2.0–3.0)

## 2021-01-07 NOTE — Patient Instructions (Signed)
Pre visit review using our clinic review tool, if applicable. No additional management support is needed unless otherwise documented below in the visit note. ° °Continue to take 1 tablet daily  except 1/2 tablet on Wednesdays.  Re-check in 6 weeks. °

## 2021-01-21 ENCOUNTER — Telehealth: Payer: Self-pay | Admitting: Family Medicine

## 2021-01-22 ENCOUNTER — Other Ambulatory Visit: Payer: Self-pay

## 2021-01-22 MED ORDER — ROSUVASTATIN CALCIUM 10 MG PO TABS: 10.0000 mg | ORAL_TABLET | Freq: Every day | ORAL | 2 refills | Status: DC

## 2021-01-22 NOTE — Telephone Encounter (Signed)
Patient spoke with Triage: Caller states her husband and her tested positive for COVID today per home test. Caller states he is having dizziness, cough (but states has always had a cough due to throat CA).  Outcome: Home Care given per Guideline

## 2021-02-09 ENCOUNTER — Other Ambulatory Visit: Payer: Self-pay | Admitting: Family Medicine

## 2021-02-18 ENCOUNTER — Inpatient Hospital Stay (HOSPITAL_COMMUNITY)
Admission: EM | Admit: 2021-02-18 | Discharge: 2021-02-21 | DRG: 158 | Disposition: A | Discharge status: Home / Self Care | Payer: Medicare HMO | Attending: Internal Medicine | Admitting: Internal Medicine

## 2021-02-18 ENCOUNTER — Other Ambulatory Visit: Payer: Self-pay

## 2021-02-18 ENCOUNTER — Encounter (HOSPITAL_COMMUNITY): Payer: Self-pay | Admitting: *Deleted

## 2021-02-18 ENCOUNTER — Ambulatory Visit: Payer: Medicare HMO | Admitting: General Practice

## 2021-02-18 DIAGNOSIS — E782 Mixed hyperlipidemia: Secondary | ICD-10-CM | POA: Diagnosis present

## 2021-02-18 DIAGNOSIS — Z79899 Other long term (current) drug therapy: Secondary | ICD-10-CM

## 2021-02-18 DIAGNOSIS — Z823 Family history of stroke: Secondary | ICD-10-CM

## 2021-02-18 DIAGNOSIS — I4891 Unspecified atrial fibrillation: Secondary | ICD-10-CM | POA: Diagnosis not present

## 2021-02-18 DIAGNOSIS — Z832 Family history of diseases of the blood and blood-forming organs and certain disorders involving the immune mechanism: Secondary | ICD-10-CM

## 2021-02-18 DIAGNOSIS — L03213 Periorbital cellulitis: Secondary | ICD-10-CM | POA: Diagnosis not present

## 2021-02-18 DIAGNOSIS — Z95810 Presence of automatic (implantable) cardiac defibrillator: Secondary | ICD-10-CM

## 2021-02-18 DIAGNOSIS — M272 Inflammatory conditions of jaws: Secondary | ICD-10-CM | POA: Diagnosis present

## 2021-02-18 DIAGNOSIS — E119 Type 2 diabetes mellitus without complications: Secondary | ICD-10-CM

## 2021-02-18 DIAGNOSIS — I5032 Chronic diastolic (congestive) heart failure: Secondary | ICD-10-CM | POA: Diagnosis present

## 2021-02-18 DIAGNOSIS — K047 Periapical abscess without sinus: Principal | ICD-10-CM

## 2021-02-18 DIAGNOSIS — Z85810 Personal history of malignant neoplasm of tongue: Secondary | ICD-10-CM

## 2021-02-18 DIAGNOSIS — I480 Paroxysmal atrial fibrillation: Secondary | ICD-10-CM | POA: Diagnosis present

## 2021-02-18 DIAGNOSIS — Z743 Need for continuous supervision: Secondary | ICD-10-CM | POA: Diagnosis not present

## 2021-02-18 DIAGNOSIS — F40240 Claustrophobia: Secondary | ICD-10-CM | POA: Diagnosis present

## 2021-02-18 DIAGNOSIS — R2681 Unsteadiness on feet: Secondary | ICD-10-CM | POA: Diagnosis present

## 2021-02-18 DIAGNOSIS — R9431 Abnormal electrocardiogram [ECG] [EKG]: Secondary | ICD-10-CM | POA: Diagnosis not present

## 2021-02-18 DIAGNOSIS — I428 Other cardiomyopathies: Secondary | ICD-10-CM | POA: Diagnosis present

## 2021-02-18 DIAGNOSIS — I4819 Other persistent atrial fibrillation: Secondary | ICD-10-CM | POA: Diagnosis present

## 2021-02-18 DIAGNOSIS — Z7901 Long term (current) use of anticoagulants: Secondary | ICD-10-CM

## 2021-02-18 DIAGNOSIS — G4733 Obstructive sleep apnea (adult) (pediatric): Secondary | ICD-10-CM | POA: Diagnosis present

## 2021-02-18 DIAGNOSIS — Z923 Personal history of irradiation: Secondary | ICD-10-CM

## 2021-02-18 DIAGNOSIS — F0390 Unspecified dementia without behavioral disturbance: Secondary | ICD-10-CM | POA: Diagnosis present

## 2021-02-18 DIAGNOSIS — R000 Tachycardia, unspecified: Secondary | ICD-10-CM | POA: Diagnosis not present

## 2021-02-18 DIAGNOSIS — R609 Edema, unspecified: Secondary | ICD-10-CM | POA: Diagnosis not present

## 2021-02-18 DIAGNOSIS — R220 Localized swelling, mass and lump, head: Secondary | ICD-10-CM | POA: Diagnosis not present

## 2021-02-18 DIAGNOSIS — L03211 Cellulitis of face: Secondary | ICD-10-CM

## 2021-02-18 DIAGNOSIS — Z8249 Family history of ischemic heart disease and other diseases of the circulatory system: Secondary | ICD-10-CM

## 2021-02-18 DIAGNOSIS — Z20822 Contact with and (suspected) exposure to covid-19: Secondary | ICD-10-CM | POA: Diagnosis present

## 2021-02-18 NOTE — ED Triage Notes (Signed)
The pt  arrived by [facility] the pt has swelling to the lt side of his face  the pts wife at bedside thinks that his face has been drooping and that his speech is slurred she recognized this approx 1830  he has redness under his lt eye that appears to be red as in an abscess  the pt has dementia

## 2021-02-18 NOTE — ED Provider Notes (Signed)
[facility] EMERGENCY DEPARTMENT Provider Note   CSN: [id number] Arrival date & time: 02/18/21  2135     History Chief Complaint  Patient presents with   Facial Swelling    Collin [name] is a 80 y.o. male.  The history is provided by the patient, the spouse and medical records. No language interpreter was used.  Sore Throat This is a new problem. The current episode started 12 to 24 hours ago. The problem occurs constantly. The problem has been gradually worsening. Associated symptoms include shortness of breath. Pertinent negatives include no chest pain, no abdominal pain and no headaches. Nothing aggravates the symptoms. Nothing relieves the symptoms. He has tried nothing for the symptoms. The treatment provided no relief.      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 It sometimes shows as a shadow on scans.     Patient Active Problem List   Diagnosis Date Noted   Osteoarthritis of both hands 09/19/2019   Malnutrition of moderate degree 11/18/2017   Supratherapeutic INR 11/14/2017   AKI (acute kidney injury) ([place]) 11/12/2017   Vein disorder    Umbilical hernia    Shingles    History of radiation therapy    History of echocardiogram    Heart disease    Dysrhythmia    Claustrophobia    CHF (congestive  heart failure) (HCC)    Cancer (HCC)    Atrial fibrillation ([place])    AICD (automatic cardioverter/defibrillator) present    History of head and neck cancer 07/25/2017   Long term (current) use of anticoagulants 06/22/2017   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   Thrush, oral 12/21/2016   Weight loss, abnormal 12/21/2016   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/98/1191   Umbilical hernia without obstruction and without gangrene    Pressure injury of skin 11/14/2016   Tongue mass: Per Ct neck 11/10/2016 11/12/2016   SBO (small bowel obstruction) ([place]) 11/11/2016   Encounter for therapeutic drug monitoring 10/25/2013   Diabetic peripheral neuropathy ([place]) 10/14/2011   Hypotension 08/20/2011   Acute renal failure ([place]) 08/20/2011   OSA (obstructive sleep apnea) 04/15/2011   ICD-[employer] 03/30/2011   Chronic diastolic CHF (congestive heart failure) ([place]) 03/30/2011   PAF (paroxysmal atrial fibrillation) ([place]) 12/14/2010   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 01/22/2016   CYST EXCISION     multiple drainage to a cyst in his neck.  DIRECT LARYNGOSCOPY N/A 11/16/2016   Procedure: DIRECT LARYNGOSCOPY AND BIOPSY;  Surgeon: Rozetta Nunnery, MD;  Location: [place];  Service: ENT;  Laterality: N/A;   EYE SURGERY     HERNIA REPAIR  1950s   IMPLANTABLE CARDIOVERTER DEFIBRILLATOR (ICD) GENERATOR CHANGE N/A 05/10/2014   Procedure: ICD GENERATOR CHANGE;  Surgeon: [name], MD;  Location: [facility] CATH LAB;  Service: Cardiovascular;  Laterality: N/A;   INSERTION OF MESH N/A 01/18/2018   Procedure: INSERTION OF MESH;  Surgeon: Jovita Kussmaul, MD;  Location: [place];  Service: General;   Laterality: N/A;   IR GASTROSTOMY TUBE MOD SED  12/27/2016   IR GASTROSTOMY TUBE REMOVAL  03/24/2018   IR PATIENT EVAL TECH 0-60 MINS  01/17/2017   MASS EXCISION Right 11/16/2016   Procedure: EXCISION RIGHT NECK LIPOMA;  Surgeon: Rozetta Nunnery, MD;  Location: [place];  Service: ENT;  Laterality: Right;   MOHS SURGERY  01/01/2015   to top of head   MOUTH SURGERY     teeth removed 4 teeth extracted 02/2010   RADICAL NECK DISSECTION Right 07/25/2017   Procedure: RIGHT NECK DISSECTION;  Surgeon: Rozetta Nunnery, MD;  Location: [place];  Service: ENT;  Laterality: Right;   UMBILICAL HERNIA REPAIR  16/96/7893   w/mesh   UMBILICAL HERNIA REPAIR N/A 01/18/2018   Procedure: UMBILICAL HERNIA REPAIR WITH MESH;  Surgeon: Jovita Kussmaul, MD;  Location: [place];  Service: General;  Laterality: N/A;       Family History  Problem Relation Age of Onset   Heart disease Mother    Sudden death Mother        under age 78   Lupus Mother    Hypertension Father    Stroke Father     Social History   Tobacco Use   Smoking status: Never   Smokeless tobacco: Never  Vaping Use   Vaping Use: Never used  Substance Use Topics   Alcohol use: Yes    Comment: "a little wine every once in a while" 01/17/2018   Drug use: No    Home Medications Prior to Admission medications   Medication Sig Start Date End Date Taking? Authorizing Provider  acetaminophen (TYLENOL) 500 MG tablet Take 1,000 mg by mouth daily as needed for moderate pain.     [provider]  carvedilol (COREG) 25 MG tablet TAKE 1 TABLET BY MOUTH TWO TIMES A DAY NEEDS APPOINTMENT 11/19/20   Burchette, Alinda Sierras, MD  diltiazem (CARDIZEM) 90 MG tablet TAKE ONE TABLET BY MOUTH DAILY 10/15/20   Fay Records, MD  donepezil (ARICEPT) 10 MG tablet TAKE ONE TABLET BY MOUTH DAILY 12/30/20   Cameron Sprang, MD  memantine (NAMENDA) 10 MG tablet Take 1 tablet every day for 2 weeks, then increase to 1 tablet twice a day and continue 09/22/20    Cameron Sprang, MD  PARoxetine (PAXIL) 20 MG tablet Take 1 tablet (20 mg total) by mouth daily. 09/22/20   Cameron Sprang, MD  polyethylene glycol [facility]) packet Take 17 g by mouth daily as needed. Available OTC 11/17/16   Rai, Ripudeep K, MD  potassium chloride (KLOR-CON) 10 MEQ tablet DISSOLVE 1 TABLET IN WATER AND ADMINISTER VIA PEG TUBE TAKE ONE TABLET BY MOUTH DAILY 10/15/20   Fay Records, MD  rosuvastatin (CRESTOR) 10 MG tablet Take 1 tablet (10 mg total) by mouth daily. 01/22/21   Fay Records, MD  torsemide (DEMADEX) 20 MG tablet TAKE  ONE AND ONE-HALF TABLETS (30 MG) BY MOUTH DAILY, ALTERNATING WITH 1 TABLET (20 MG) EVERY THIRD DAY 11/19/20   Fay Records, MD  warfarin (COUMADIN) 5 MG tablet TAKE ONE TABLET BY MOUTH DAILY OR AS INSTRUCTED BY COUMADIN CLINIC 02/10/21   Burchette, Alinda Sierras, MD    Allergies    Niaspan [niacin er]  Review of Systems   Review of Systems  Constitutional:  Negative for chills, diaphoresis, fatigue and fever.  HENT:  Positive for facial swelling. Negative for congestion and drooling.   Eyes:  Negative for visual disturbance.  Respiratory:  Positive for shortness of breath. Negative for cough, chest tightness and wheezing.   Cardiovascular:  Negative for chest pain, palpitations and leg swelling.  Gastrointestinal:  Negative for abdominal pain, constipation, diarrhea, nausea and vomiting.  Genitourinary:  Negative for flank pain.  Musculoskeletal:  Positive for neck pain. Negative for back pain and neck stiffness.  Skin:  Negative for rash and wound.  Neurological:  Negative for weakness, light-headedness, numbness and headaches.  Psychiatric/Behavioral:  Negative for agitation.   All other systems reviewed and are negative.  Physical Exam Updated Vital Signs BP 130/85   Pulse 96   Resp 18   Ht 6\' 1"  (1.854 m)   Wt 97.5 kg   SpO2 94%   BMI 28.37 kg/m   Physical Exam Vitals and nursing note reviewed.  Constitutional:      General: He is not in  acute distress.    Appearance: He is well-developed. He is not ill-appearing, toxic-appearing or diaphoretic.  HENT:     Head:     Comments: Significant edema and swelling of the left face including the left periorbital area, left cheek, left upper lip, and swelling and tenderness in the left submandibular and neck area.  No stridor.  No carotid bruit appreciated.  Normal tongue range of motion and no evidence of PTA or RPA on initial exam.  No erythema seen posteriorly in the oropharynx.  Normal extraocular movements.  Pupils symmetric and reactive.  No posterior neck tenderness on my exam.    Nose: No congestion or rhinorrhea.     Mouth/Throat:     Mouth: Mucous membranes are moist.     Pharynx: No oropharyngeal exudate or posterior oropharyngeal erythema.  Eyes:     Extraocular Movements: Extraocular movements intact.     Conjunctiva/sclera: Conjunctivae normal.     Pupils: Pupils are equal, round, and reactive to light.  Neck:     Vascular: No carotid bruit.  Cardiovascular:     Rate and Rhythm: Normal rate and regular rhythm.     Heart sounds: No murmur heard. Pulmonary:     Effort: Pulmonary effort is normal. No respiratory distress.     Breath sounds: Normal breath sounds. No wheezing, rhonchi or rales.  Chest:     Chest wall: No tenderness.  Abdominal:     Palpations: Abdomen is soft.     Tenderness: There is no abdominal tenderness. There is no right CVA tenderness, left CVA tenderness, guarding or rebound.  Musculoskeletal:        General: Tenderness present.     Cervical back: Neck supple. Tenderness present.  Skin:    General: Skin is warm and dry.     Capillary Refill: Capillary refill takes less than 2 seconds.     Findings: No erythema.  Neurological:     Mental Status: He is alert.     Sensory: No sensory deficit.  Motor: No weakness.  Psychiatric:        Mood and Affect: Mood normal.       ED Results / Procedures / Treatments   Labs (all labs ordered  are listed, but only abnormal results are displayed) Labs Reviewed  CBC WITH DIFFERENTIAL/PLATELET - Abnormal; Notable for the following components:      Result Value   Lymphs Abs 0.4 (*)    All other components within normal limits  COMPREHENSIVE METABOLIC PANEL - Abnormal; Notable for the following components:   Glucose, Bld 130 (*)    Calcium 8.8 (*)    Albumin 3.4 (*)    All other components within normal limits  PROTIME-INR - Abnormal; Notable for the following components:   Prothrombin Time 35.8 (*)    INR 3.6 (*)    All other components within normal limits  TROPONIN I (HIGH SENSITIVITY) - Abnormal; Notable for the following components:   Troponin I (High Sensitivity) 21 (*)    All other components within normal limits  TROPONIN I (HIGH SENSITIVITY) - Abnormal; Notable for the following components:   Troponin I (High Sensitivity) 22 (*)    All other components within normal limits  CULTURE, BLOOD (ROUTINE X 2)  CULTURE, BLOOD (ROUTINE X 2)  MRSA NEXT GEN BY PCR, NASAL  LACTIC ACID, PLASMA  LACTIC ACID, PLASMA  HEMOGLOBIN A1C    EKG None  Radiology CT Soft Tissue Neck W Contrast  Result Date: 02/19/2021 CLINICAL DATA:  80 year old male with history of tongue cancer. New left facial swelling, also tenderness in the right submandibular area. EXAM: CT NECK WITH CONTRAST TECHNIQUE: Multidetector CT imaging of the neck was performed using the standard protocol following the bolus administration of intravenous contrast. CONTRAST:  56mL OMNIPAQUE IOHEXOL 300 MG/ML  SOLN COMPARISON:  Face CT today reported separately. PET-CT 01/09/2019.  Prior neck CT 09/01/2017, 11/10/2016. FINDINGS: Pharynx and larynx: Mild generalized pharyngeal mucosal space thickening compatible with prior XRT. Trace retained secretions in the oropharynx. Pharyngeal and laryngeal soft tissue contours are within normal limits. No discrete residual right base of tongue mass. Parapharyngeal and retropharyngeal  spaces are within normal limits. Salivary glands: Residual asymmetric sublingual space soft tissue on series 5, image 57, although not significantly changed compared to 09/01/2017 and regressed from the initial neck CT. No significant sublingual mass effect. Post XRT atrophy of the bilateral submandibular glands. Parotid glands are better preserved and within normal limits. Thyroid: Negative. Lymph nodes: Matted soft tissue along the right level 2 and level 3 lymph node stations (series 1, image 51) which is regressed compared to 09/01/2017 and the site of the dominant malignant nodes on the presentation exam. Small bilateral level 1 nodes. No discrete lymphadenopathy today. Vascular: Major vascular structures in the neck and at the skull base are patent. Right IJ remains in place. Bilateral carotid bifurcation calcified atherosclerosis worse on the left. Codominant vertebral arteries. V4 segment and bulky left greater than right visible ICA siphon calcified plaque. Limited intracranial: Negative. Visualized orbits: Postoperative changes to both globes. Asymmetric left preseptal and left facial soft tissue swelling in conjunction with abnormal perioral soft tissue swelling. See maxillofacial CT reported separately. Mastoids and visualized paranasal sinuses: See face CT reported separately. Skeleton: Carious dentition. See face CT reported separately. Skull base and cervical spine appear stable. Osteopenia. But no osseous metastatic disease identified. Congenital incomplete ossification of the posterior C1 ring. Upper chest: Partially visible left chest pacemaker device. Little of the superior mediastinum is included. Negative lung  apices. IMPRESSION: 1. Acute inflammatory perioral, left face, and left periorbital process is detailed on the Face CT reported separately. 2. Satisfactory post treatment appearance of the neck. Post XRT changes. Residual asymmetric soft tissue in the right sublingual space and right level  2/3 lymph node stations corresponding to sites of treated tumor in 2018. 3. Calcified carotid atherosclerosis. Electronically Signed   By: Genevie Ann M.D.   On: 02/19/2021 04:16   CT Maxillofacial W Contrast  Result Date: 02/19/2021 CLINICAL DATA:  80 year old male with history of tongue cancer. New left facial swelling, also tenderness in the right submandibular area. EXAM: CT MAXILLOFACIAL WITH CONTRAST TECHNIQUE: Multidetector CT imaging of the maxillofacial structures was performed with intravenous contrast. Multiplanar CT image reconstructions were also generated. CONTRAST:  11mL OMNIPAQUE IOHEXOL 300 MG/ML SOLN in conjunction with contrast enhanced imaging of the neck reported separately. COMPARISON:  Neck CT reported separately. FINDINGS: Osseous: Extensively carious dentition. Mandible is intact and normally located. Carious anterior left mandible molar with periapical lucency and lateral mandible body dehiscence (series 4, image 25) which is in proximity to the left lateral facial soft tissue inflammation. Poor maxillary dentition also with periapical lucency at the left anterior maxillary bicuspid which is in proximity to the periorbital soft tissue inflammation. And along the anterior maxillary body overlying the carious left lateral incisor where additional periapical lucency is noted there is a small semicircular roughly 9 mm subperiosteal abscess on series 3, image 52. Additional generalized periorbital edema and soft tissue swelling which tracks up the left nasal labial fold. No other No acute osseous abnormality identified. Orbits: Intact orbital walls. Globes and intraorbital soft tissues appear intact. There is left side preseptal soft tissue swelling and stranding. No soft tissue gas. No periorbital fluid collection. Sinuses: Opacified left maxillary sinus with mucoperiosteal thickening and poor left maxillary dentition. Contralateral mild to moderate right maxillary mucoperiosteal thickening.  Subtotal opacification of the left anterior ethmoid air cells. Bubbly opacity in the left frontal sinus. Opacified left OMC. Small volume retained secretions in the left nasopharynx. Tympanic cavities and mastoids are clear. Soft tissues: Widespread soft tissue inflammation in the left face, perioral soft tissues eccentric to the left, and left preseptal space as detailed above. Other deep space face and neck soft tissue findings are reported on the neck CT today. Limited intracranial: ICA siphon and distal vertebral artery calcified atherosclerosis greater on the left. Visible brain parenchyma is within normal limits for age. IMPRESSION: 1. Poor dentition with multifocal odontogenic cellulitis suspected: - perioral cellulitis tracking up the left nasal labial fold with a 9 mm subperiosteal abscess overlying the anterior left maxillary alveolus, and likely associated with the left maxillary lateral incisor and/or anterior bicuspid. - left mandible molar periapical lucency and dehiscence at the lateral mandible body with regional left face inflammation. - preseptal left orbit involvement also, but no other complicating features. 2. Left OMC obstructive pattern of acute on chronic paranasal sinus disease, possibly also odontogenic originating from the left maxillary sinus. 3. Other Neck restaging is detailed on the Neck CT today. Electronically Signed   By: Genevie Ann M.D.   On: 02/19/2021 04:26   DG Chest Portable 1 View  Result Date: 02/19/2021 CLINICAL DATA:  Shortness of breath EXAM: PORTABLE CHEST 1 VIEW COMPARISON:  08/08/2018 FINDINGS: Left AICD remains in place, unchanged. Cardiomegaly. No confluent opacities, effusions or edema. No acute bony abnormality. IMPRESSION: Cardiomegaly.  No active disease. Electronically Signed   By: Rolm Baptise M.D.   On:  02/19/2021 00:28    Procedures Procedures   CRITICAL CARE Performed by: Gwenyth Allegra Sasha Rueth Total critical care time: 35 minutes Critical care time  was exclusive of separately billable procedures and treating other patients. Critical care was necessary to treat or prevent imminent or life-threatening deterioration. Critical care was time spent personally by me on the following activities: development of treatment plan with patient and/or surrogate as well as nursing, discussions with consultants, evaluation of patient's response to treatment, examination of patient, obtaining history from patient or surrogate, ordering and performing treatments and interventions, ordering and review of laboratory studies, ordering and review of radiographic studies, pulse oximetry and re-evaluation of patient's condition.   Medications Ordered in ED Medications  Ampicillin-Sulbactam (UNASYN) 3 g in sodium chloride 0.9 % 100 mL IVPB (0 g Intravenous Stopped 02/19/21 0636)  carvedilol (COREG) tablet 25 mg (has no administration in time range)  diltiazem (CARDIZEM) tablet 90 mg (has no administration in time range)  rosuvastatin (CRESTOR) tablet 10 mg (has no administration in time range)  PARoxetine (PAXIL) tablet 20 mg (has no administration in time range)  donepezil (ARICEPT) tablet 10 mg (has no administration in time range)  acetaminophen (TYLENOL) tablet 650 mg (has no administration in time range)    Or  acetaminophen (TYLENOL) suppository 650 mg (has no administration in time range)  polyethylene glycol (MIRALAX / GLYCOLAX) packet 17 g (has no administration in time range)  ondansetron (ZOFRAN) tablet 4 mg (has no administration in time range)    Or  ondansetron (ZOFRAN) injection 4 mg (has no administration in time range)  insulin aspart (novoLOG) injection 0-15 Units (has no administration in time range)  iohexol (OMNIPAQUE) 300 MG/ML solution 75 mL (75 mLs Intravenous Contrast Given 02/19/21 0328)    ED Course  I have reviewed the triage vital signs and the nursing notes.  Pertinent labs & imaging results that were available during my care of  the patient were reviewed by me and considered in my medical decision making (see chart for details).    MDM Rules/Calculators/A&P                          Collin [name] is a 80 y.o. male with a past medical history significant for previous base of tongue neoplasm status post previous surgery and radiation, paroxysmal atrial fibrillation on Coumadin therapy, hyperlipidemia, CHF with ICD, diabetes, and sleep apnea who presents with 1 day of left facial swelling and left neck pain.  According to patient, he was at his baseline yesterday and had no previous symptoms.  Wife says that this morning she noticed some swelling near his left orbit which she has had in the past and she did some compresses and he went to his adult daycare.  When he returned home, the swelling has markedly worsened in the left face, left cheek, left upper lip, and under his left jaw where he is having pain.  Patient reports that he always has some difficulty swallowing since his cancer but has not had pain until today.  He reportedly had a cyst removed from his right submandibular area in the past but did not have any complication on the left side he thinks.  He is reporting some mild shortness of breath but denies any cough or chest pain.  Denies any fevers or chills but does report feeling tired.  He denies any vision changes or any symptoms in his arms or legs.  Wife thinks that  his voice sounds different with the swelling of his lips but he is still speaking normally otherwise.  On exam, patient does have swelling in his left face in the cheek and around the orbit.  There was no true fluctuance but there was just diffuse edema.  Patient had normal extraocular movements and pupils are symmetric and reactive.  Speech was clear for me but he does have swelling in his left upper lip almost like angioedema.  Unlike angioedema, patient does have tenderness and pain in his left throat and neck.  No crepitance or stridor appreciated.  No  carotid bruit appreciated.  Neck otherwise nontender.  Lungs clear and chest nontender.  Exam otherwise unremarkable.  Clinically I am concerned about a deep space neck infection that is causing some fluid to be backed up into his face.  Less concerned about orbital cellulitis based on the swelling in the neck and lip.  Chart review does not show any evidence of a sore arm that would predispose him to angioedema although wife does report that his symptoms have been only worsening throughout the day.  Will get labs, chest x-ray with his mild shortness of breath that he reports is chronic, as well as some CTs of the face and neck.  Given his worsening symptoms and his history, I  do anticipate he may need admission.  Objectively he is tachycardic but is not hypotensive.  We will get a rectal temperature.  Family reports he had COVID several weeks ago so will not reswab for COVID.  4:41 AM CT finally completed and shows he does have facial cellulitis coming from an odontogenic source with subperiosteal abscess.  The cellulitis then tracked to the preseptal area on the face and on the cheek.  The soft tissue neck CT does not show any acute abscess or deep infection of the neck at this time.  Given the patient's rapidly worsening cellulitis of the face today, will start antibiotics and admit for further monitoring and management.  Oral surgery will be available consultation in the morning, anticipate admitting team calling for consultation to discuss determine if there is any surgical management needed with the abscess.  4:46 AM Just spoke to pharmacy who recommended clindamycin and Unasyn for coverage of this rapidly progressive facial cellulitis coming from a dental source.  Medicine team will be called for admission and I anticipate oral surgery consultation in the a.m.  Final Clinical Impression(s) / ED Diagnoses Final diagnoses:  Facial cellulitis  Preseptal cellulitis of left eye  Dental  abscess  Left facial swelling    Rx / DC Orders ED Discharge Orders     None      Clinical Impression: 1. Facial cellulitis   2. Preseptal cellulitis of left eye   3. Dental abscess   4. Left facial swelling     Disposition: Admit  This note was prepared with assistance of Dragon voice recognition software. Occasional wrong-word or sound-a-like substitutions may have occurred due to the inherent limitations of voice recognition software.     Mitul Hallowell, Gwenyth Allegra, MD 02/19/21 [phone]

## 2021-02-19 ENCOUNTER — Emergency Department (HOSPITAL_COMMUNITY): Payer: Medicare HMO

## 2021-02-19 ENCOUNTER — Encounter (HOSPITAL_COMMUNITY): Payer: Self-pay | Admitting: Internal Medicine

## 2021-02-19 DIAGNOSIS — L03213 Periorbital cellulitis: Secondary | ICD-10-CM | POA: Diagnosis not present

## 2021-02-19 DIAGNOSIS — Z85810 Personal history of malignant neoplasm of tongue: Secondary | ICD-10-CM | POA: Diagnosis not present

## 2021-02-19 DIAGNOSIS — F40240 Claustrophobia: Secondary | ICD-10-CM | POA: Diagnosis present

## 2021-02-19 DIAGNOSIS — E119 Type 2 diabetes mellitus without complications: Secondary | ICD-10-CM | POA: Diagnosis not present

## 2021-02-19 DIAGNOSIS — Z7901 Long term (current) use of anticoagulants: Secondary | ICD-10-CM | POA: Diagnosis not present

## 2021-02-19 DIAGNOSIS — F0390 Unspecified dementia without behavioral disturbance: Secondary | ICD-10-CM | POA: Diagnosis present

## 2021-02-19 DIAGNOSIS — I517 Cardiomegaly: Secondary | ICD-10-CM | POA: Diagnosis not present

## 2021-02-19 DIAGNOSIS — E782 Mixed hyperlipidemia: Secondary | ICD-10-CM

## 2021-02-19 DIAGNOSIS — R220 Localized swelling, mass and lump, head: Secondary | ICD-10-CM

## 2021-02-19 DIAGNOSIS — Z8249 Family history of ischemic heart disease and other diseases of the circulatory system: Secondary | ICD-10-CM | POA: Diagnosis not present

## 2021-02-19 DIAGNOSIS — K029 Dental caries, unspecified: Secondary | ICD-10-CM | POA: Diagnosis not present

## 2021-02-19 DIAGNOSIS — Z95810 Presence of automatic (implantable) cardiac defibrillator: Secondary | ICD-10-CM | POA: Diagnosis not present

## 2021-02-19 DIAGNOSIS — I428 Other cardiomyopathies: Secondary | ICD-10-CM | POA: Diagnosis not present

## 2021-02-19 DIAGNOSIS — I5032 Chronic diastolic (congestive) heart failure: Secondary | ICD-10-CM

## 2021-02-19 DIAGNOSIS — K047 Periapical abscess without sinus: Secondary | ICD-10-CM

## 2021-02-19 DIAGNOSIS — L03211 Cellulitis of face: Secondary | ICD-10-CM | POA: Diagnosis not present

## 2021-02-19 DIAGNOSIS — G4733 Obstructive sleep apnea (adult) (pediatric): Secondary | ICD-10-CM | POA: Diagnosis present

## 2021-02-19 DIAGNOSIS — Z20822 Contact with and (suspected) exposure to covid-19: Secondary | ICD-10-CM | POA: Diagnosis not present

## 2021-02-19 DIAGNOSIS — Z79899 Other long term (current) drug therapy: Secondary | ICD-10-CM | POA: Diagnosis not present

## 2021-02-19 DIAGNOSIS — K122 Cellulitis and abscess of mouth: Secondary | ICD-10-CM | POA: Diagnosis not present

## 2021-02-19 DIAGNOSIS — M272 Inflammatory conditions of jaws: Secondary | ICD-10-CM | POA: Diagnosis not present

## 2021-02-19 DIAGNOSIS — C779 Secondary and unspecified malignant neoplasm of lymph node, unspecified: Secondary | ICD-10-CM | POA: Diagnosis not present

## 2021-02-19 DIAGNOSIS — Z832 Family history of diseases of the blood and blood-forming organs and certain disorders involving the immune mechanism: Secondary | ICD-10-CM | POA: Diagnosis not present

## 2021-02-19 DIAGNOSIS — Z823 Family history of stroke: Secondary | ICD-10-CM | POA: Diagnosis not present

## 2021-02-19 DIAGNOSIS — I480 Paroxysmal atrial fibrillation: Secondary | ICD-10-CM | POA: Diagnosis not present

## 2021-02-19 DIAGNOSIS — R2681 Unsteadiness on feet: Secondary | ICD-10-CM | POA: Diagnosis present

## 2021-02-19 DIAGNOSIS — R69 Illness, unspecified: Secondary | ICD-10-CM | POA: Diagnosis not present

## 2021-02-19 DIAGNOSIS — Z923 Personal history of irradiation: Secondary | ICD-10-CM | POA: Diagnosis not present

## 2021-02-19 DIAGNOSIS — R0602 Shortness of breath: Secondary | ICD-10-CM | POA: Diagnosis not present

## 2021-02-19 DIAGNOSIS — I6523 Occlusion and stenosis of bilateral carotid arteries: Secondary | ICD-10-CM | POA: Diagnosis not present

## 2021-02-19 DIAGNOSIS — L03221 Cellulitis of neck: Secondary | ICD-10-CM | POA: Diagnosis not present

## 2021-02-19 LAB — COMPREHENSIVE METABOLIC PANEL
ALT: 16 U/L (ref 0–44)
AST: 15 U/L (ref 15–41)
Albumin: 3.4 g/dL — ABNORMAL LOW (ref 3.5–5.0)
Alkaline Phosphatase: 92 U/L (ref 38–126)
Anion gap: 8 (ref 5–15)
BUN: 15 mg/dL (ref 8–23)
CO2: 26 mmol/L (ref 22–32)
Calcium: 8.8 mg/dL — ABNORMAL LOW (ref 8.9–10.3)
Chloride: 103 mmol/L (ref 98–111)
Creatinine, Ser: 0.87 mg/dL (ref 0.61–1.24)
GFR, Estimated: >60 mL/min (ref >60)
Glucose, Bld: 130 mg/dL — ABNORMAL HIGH (ref 70–99)
Potassium: 4 mmol/L (ref 3.5–5.1)
Sodium: 137 mmol/L (ref 135–145)
Total Bilirubin: 0.8 mg/dL (ref 0.3–1.2)
Total Protein: 7.1 g/dL (ref 6.5–8.1)

## 2021-02-19 LAB — LACTIC ACID, PLASMA: Lactic Acid, Venous: 0.9 mmol/L (ref 0.5–1.9)

## 2021-02-19 LAB — CBC WITH DIFFERENTIAL/PLATELET
Abs Immature Granulocytes: 0.02 10*3/uL (ref 0.00–0.07)
Basophils Absolute: 0 10*3/uL (ref 0.0–0.1)
Basophils Relative: 0 %
Eosinophils Absolute: 0 10*3/uL (ref 0.0–0.5)
Eosinophils Relative: 1 %
HCT: 40.5 % (ref 39.0–52.0)
Hemoglobin: 13.2 g/dL (ref 13.0–17.0)
Immature Granulocytes: 0 %
Lymphocytes Relative: 6 %
Lymphs Abs: 0.4 10*3/uL — ABNORMAL LOW (ref 0.7–4.0)
MCH: 30.6 pg (ref 26.0–34.0)
MCHC: 32.6 g/dL (ref 30.0–36.0)
MCV: 93.8 fL (ref 80.0–100.0)
Monocytes Absolute: 0.8 10*3/uL (ref 0.1–1.0)
Monocytes Relative: 13 %
Neutro Abs: 5.2 10*3/uL (ref 1.7–7.7)
Neutrophils Relative %: 80 %
Platelets: 180 10*3/uL (ref 150–400)
RBC: 4.32 MIL/uL (ref 4.22–5.81)
RDW: 14.6 % (ref 11.5–15.5)
WBC: 6.4 10*3/uL (ref 4.0–10.5)
nRBC: 0 % (ref 0.0–0.2)

## 2021-02-19 LAB — PROTIME-INR
INR: 3.6 — ABNORMAL HIGH (ref 0.8–1.2)
Prothrombin Time: 35.8 seconds — ABNORMAL HIGH (ref 11.4–15.2)

## 2021-02-19 LAB — CBG MONITORING, ED
Glucose-Capillary: 110 mg/dL — ABNORMAL HIGH (ref 70–99)
Glucose-Capillary: 113 mg/dL — ABNORMAL HIGH (ref 70–99)

## 2021-02-19 LAB — GLUCOSE, CAPILLARY
Glucose-Capillary: 118 mg/dL — ABNORMAL HIGH (ref 70–99)
Glucose-Capillary: 123 mg/dL — ABNORMAL HIGH (ref 70–99)

## 2021-02-19 LAB — TROPONIN I (HIGH SENSITIVITY)
Troponin I (High Sensitivity): 21 ng/L — ABNORMAL HIGH (ref <18)
Troponin I (High Sensitivity): 22 ng/L — ABNORMAL HIGH (ref <18)

## 2021-02-19 LAB — SARS CORONAVIRUS 2 (TAT 6-24 HRS): SARS Coronavirus 2: NEGATIVE

## 2021-02-19 MED ORDER — CLINDAMYCIN PHOSPHATE 900 MG/50ML IV SOLN
900.0000 mg | Freq: Three times a day (TID) | INTRAVENOUS | Status: DC
  Administered 2021-02-19: 900 mg via INTRAVENOUS
  Filled 2021-02-19: qty 50

## 2021-02-19 MED ORDER — CARVEDILOL 12.5 MG PO TABS
25.0000 mg | ORAL_TABLET | Freq: Two times a day (BID) | ORAL | Status: DC
  Administered 2021-02-19 – 2021-02-21 (×5): 25 mg via ORAL
  Filled 2021-02-19 (×5): qty 2

## 2021-02-19 MED ORDER — INSULIN ASPART 100 UNIT/ML IJ SOLN
0.0000 [IU] | Freq: Three times a day (TID) | INTRAMUSCULAR | Status: DC
  Administered 2021-02-19: 2 [IU] via SUBCUTANEOUS

## 2021-02-19 MED ORDER — TORSEMIDE 20 MG PO TABS: 10.0000 mg | ORAL_TABLET | Freq: Once | ORAL | Status: DC

## 2021-02-19 MED ORDER — ROSUVASTATIN CALCIUM 5 MG PO TABS
10.0000 mg | ORAL_TABLET | Freq: Every day | ORAL | Status: DC
  Administered 2021-02-19 – 2021-02-21 (×3): 10 mg via ORAL
  Filled 2021-02-19 (×3): qty 2

## 2021-02-19 MED ORDER — ACETAMINOPHEN 650 MG RE SUPP: 650.0000 mg | Freq: Four times a day (QID) | RECTAL | Status: DC | PRN

## 2021-02-19 MED ORDER — MEMANTINE HCL 10 MG PO TABS
10.0000 mg | ORAL_TABLET | Freq: Two times a day (BID) | ORAL | Status: DC
  Administered 2021-02-19 – 2021-02-21 (×5): 10 mg via ORAL
  Filled 2021-02-19 (×7): qty 1

## 2021-02-19 MED ORDER — SODIUM CHLORIDE 0.9 % IV SOLN
3.0000 g | Freq: Four times a day (QID) | INTRAVENOUS | Status: DC
  Administered 2021-02-19 – 2021-02-21 (×9): 3 g via INTRAVENOUS
  Filled 2021-02-19 (×12): qty 8

## 2021-02-19 MED ORDER — ACETAMINOPHEN 325 MG PO TABS: 650.0000 mg | ORAL_TABLET | Freq: Four times a day (QID) | ORAL | Status: DC | PRN

## 2021-02-19 MED ORDER — PAROXETINE HCL 20 MG PO TABS
20.0000 mg | ORAL_TABLET | Freq: Every day | ORAL | Status: DC
  Administered 2021-02-19 – 2021-02-21 (×3): 20 mg via ORAL
  Filled 2021-02-19 (×3): qty 1

## 2021-02-19 MED ORDER — POLYETHYLENE GLYCOL 3350 17 G PO PACK: 17.0000 g | PACK | Freq: Every day | ORAL | Status: DC | PRN

## 2021-02-19 MED ORDER — DONEPEZIL HCL 10 MG PO TABS
10.0000 mg | ORAL_TABLET | Freq: Every day | ORAL | Status: DC
  Administered 2021-02-19 – 2021-02-21 (×3): 10 mg via ORAL
  Filled 2021-02-19 (×3): qty 1

## 2021-02-19 MED ORDER — IOHEXOL 300 MG/ML  SOLN
75.0000 mL | Freq: Once | INTRAMUSCULAR | Status: AC | PRN
  Administered 2021-02-19: 75 mL via INTRAVENOUS

## 2021-02-19 MED ORDER — ONDANSETRON HCL 4 MG PO TABS: 4.0000 mg | ORAL_TABLET | Freq: Four times a day (QID) | ORAL | Status: DC | PRN

## 2021-02-19 MED ORDER — ONDANSETRON HCL 4 MG/2ML IJ SOLN: 4.0000 mg | Freq: Four times a day (QID) | INTRAMUSCULAR | Status: DC | PRN

## 2021-02-19 MED ORDER — DILTIAZEM HCL 30 MG PO TABS
90.0000 mg | ORAL_TABLET | Freq: Every day | ORAL | Status: DC
  Administered 2021-02-19 – 2021-02-21 (×3): 90 mg via ORAL
  Filled 2021-02-19: qty 1
  Filled 2021-02-19 (×2): qty 3

## 2021-02-19 NOTE — H&P (Signed)
History and Physical    Collin [name] ZOX:[id number] DOB: 10-17-1940 DOA: 02/18/2021  PCP: [name], MD  Patient coming from: Home via EMS   Chief Complaint:  Chief Complaint  Patient presents with   Facial Swelling     HPI:    80 year old male with past medical history of paroxysmal atrial fibrillation (on coumadin), nonischemic cardiomyopathy status post ICD placement (last echo 2018 EF 55-60%), tongue cancer (S/P surgery/radiation 2018), and dementia who presents to [facility] emergency department with complaints of left facial swelling.  Patient explains that for approximately the past 4 days he has been experiencing increasing left facial swelling.  This is been associated with redness of the left face.  Patient denies any significant pain of the face but does complain of severe dental pain, particularly in several of his teeth in the left side of his mouth which have broken.  The severe pain is sharp in quality and has been ongoing for at least the past several weeks preceding the facial swelling.  Patient complains of associated generalized weakness and unsteady gait.  Upon further questioning patient denies fevers, sick contacts, recent travel or confirmed contact with COVID-19 infection.  Patient eventually presented to [facility] emergency department for evaluation.  Upon evaluation in the emergency department clinically the patient was felt to be suffering from a left facial cellulitis.  CT imaging revealed poor dentition with multifocal odontogenic cellulitis in addition to a 9 mm subperiosteal abscess overlying the anterior left maxillary alveolus.  ER provider provide the patient with intravenous Unasyn and clindamycin.  The hospitalist group was then called to assess the patient for admission to the hospital.  Review of Systems:   Review of Systems  Constitutional:  Positive for malaise/fatigue.  HENT:         Facial swelling  Neurological:   Positive for weakness.  All other systems reviewed and are negative.  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 It sometimes shows as a shadow on scans.     Past Surgical History:  Procedure Laterality Date   CARDIAC DEFIBRILLATOR PLACEMENT  2006   CATARACT EXTRACTION Bilateral    01/07/2016. 01/22/2016   CYST EXCISION     multiple drainage to a cyst in his neck.    DIRECT LARYNGOSCOPY N/A 11/16/2016   Procedure: DIRECT LARYNGOSCOPY AND BIOPSY;  Surgeon: [name], MD;  Location: [facility] OR;  Service: ENT;  Laterality: N/A;   EYE SURGERY     HERNIA REPAIR  1950s   IMPLANTABLE CARDIOVERTER DEFIBRILLATOR (ICD) GENERATOR CHANGE N/A 05/10/2014   Procedure: ICD GENERATOR CHANGE;  Surgeon: [name], MD;  Location: [facility] CATH LAB;  Service:  Cardiovascular;  Laterality: N/A;   INSERTION OF MESH N/A 01/18/2018   Procedure: INSERTION OF MESH;  Surgeon: [name], MD;  Location: [facility] OR;  Service: General;  Laterality: N/A;   IR GASTROSTOMY TUBE MOD SED  12/27/2016   IR GASTROSTOMY TUBE REMOVAL  03/24/2018   IR PATIENT EVAL TECH 0-60 MINS  01/17/2017   MASS EXCISION Right 11/16/2016   Procedure: EXCISION RIGHT NECK LIPOMA;  Surgeon: [name], MD;  Location: [facility] OR;  Service: ENT;  Laterality: Right;   MOHS SURGERY  01/01/2015   to top of  head   MOUTH SURGERY     teeth removed 4 teeth extracted 02/2010   RADICAL NECK DISSECTION Right 07/25/2017   Procedure: RIGHT NECK DISSECTION;  Surgeon: [name], MD;  Location: [facility] OR;  Service: ENT;  Laterality: Right;   UMBILICAL HERNIA REPAIR  01/18/2018   w/mesh   UMBILICAL HERNIA REPAIR N/A 01/18/2018   Procedure: UMBILICAL HERNIA REPAIR WITH MESH;  Surgeon: [name], MD;  Location: [facility] OR;  Service: General;  Laterality: N/A;     reports that he has never smoked. He has never used smokeless tobacco. He reports current alcohol use. He reports that he does not use drugs.  Allergies  Allergen Reactions   Niaspan [Niacin Er] Other (See Comments)    FLUSHED    Family History  Problem Relation Age of Onset   Heart disease Mother    Sudden death Mother        under age 10   Lupus Mother    Hypertension Father    Stroke Father      Prior to Admission medications   Medication Sig Start Date End Date Taking? Authorizing Provider  acetaminophen (TYLENOL) 500 MG tablet Take 1,000 mg by mouth daily as needed for moderate pain.     [provider]  carvedilol (COREG) 25 MG tablet TAKE 1 TABLET BY MOUTH TWO TIMES A DAY NEEDS APPOINTMENT 11/19/20   Burchette, Elberta Fortis, MD  diltiazem (CARDIZEM) 90 MG tablet TAKE ONE TABLET BY MOUTH DAILY 10/15/20   Pricilla Riffle, MD  donepezil (ARICEPT) 10 MG tablet TAKE ONE TABLET BY MOUTH DAILY 12/30/20   Van Clines, MD  memantine (NAMENDA) 10 MG tablet Take 1 tablet every day for 2 weeks, then increase to 1 tablet twice a day and continue 09/22/20   Van Clines, MD  PARoxetine (PAXIL) 20 MG tablet Take 1 tablet (20 mg total) by mouth daily. 09/22/20   Van Clines, MD  polyethylene glycol [facility]) packet Take 17 g by mouth daily as needed. Available OTC 11/17/16   Rai, Ripudeep K, MD  potassium chloride (KLOR-CON) 10 MEQ tablet DISSOLVE 1 TABLET IN WATER AND ADMINISTER VIA PEG TUBE TAKE ONE TABLET BY MOUTH DAILY 10/15/20    Pricilla Riffle, MD  rosuvastatin (CRESTOR) 10 MG tablet Take 1 tablet (10 mg total) by mouth daily. 01/22/21   Pricilla Riffle, MD  torsemide (DEMADEX) 20 MG tablet TAKE ONE AND ONE-HALF TABLETS (30 MG) BY MOUTH DAILY, ALTERNATING WITH 1 TABLET (20 MG) EVERY THIRD DAY 11/19/20   Pricilla Riffle, MD  warfarin (COUMADIN) 5 MG tablet TAKE ONE TABLET BY MOUTH DAILY OR AS INSTRUCTED BY COUMADIN CLINIC 02/10/21   [name], MD    Physical Exam: Vitals:   02/19/21 0445 02/19/21 0500 02/19/21 0600 02/19/21 0615  BP:  104/69 113/64   Pulse:  (!) 42  (!) 106  Resp:  18 19 14   Temp:      TempSrc:      SpO2: 96% 97% 98% 98%  Weight:      Height:        Constitutional: Awake alert and oriented x3, no associated distress.   Skin: no rashes, no lesions, good skin turgor noted. Eyes: Pupils are equally reactive to light.  No evidence of scleral icterus or conjunctival pallor.  ENMT: Significant left facial redness and swelling noted.  Extremely poor dentition noted with multiple broken teeth particularly of the upper row.  Significant tenderness of the gingiva identified surrounding the broken teeth.  Moist mucous membranes noted.  Posterior pharynx clear of any exudate or lesions.   Neck: Notable scarring and derangement of anatomy due to previous surgical and radiation intervention.  no masses, no thyromegaly.  No evidence of jugular venous distension.   Respiratory: clear to auscultation bilaterally, no wheezing, no crackles. Normal respiratory effort. No accessory muscle use.  Cardiovascular: Regular rate and rhythm, no murmurs / rubs / gallops. No extremity edema. 2+ pedal pulses. No carotid bruits.  Chest:   Nontender without crepitus or deformity.   Back:   Nontender without crepitus or deformity. Abdomen: Abdomen is soft and nontender.  No evidence of intra-abdominal masses.  Positive bowel sounds noted in all quadrants.   Musculoskeletal: No joint deformity upper and lower extremities. Good  ROM, no contractures. Normal muscle tone.  Neurologic: CN 2-12 grossly intact. Sensation intact.  Patient moving all 4 extremities spontaneously.  Patient is following all commands.  Patient is responsive to verbal stimuli.   Psychiatric: Patient exhibits normal mood with appropriate affect.  Patient seems to possess insight as to their current situation.     Labs on Admission: I have personally reviewed following labs and imaging studies -   CBC: Recent Labs  Lab 02/19/21 0007  WBC 6.4  NEUTROABS 5.2  HGB 13.2  HCT 40.5  MCV 93.8  PLT 180   Basic Metabolic Panel: Recent Labs  Lab 02/19/21 0007  NA 137  K 4.0  CL 103  CO2 26  GLUCOSE 130*  BUN 15  CREATININE 0.87  CALCIUM 8.8*   GFR: Estimated Creatinine Clearance: 83.2 mL/min (by C-G formula based on SCr of 0.87 mg/dL). Liver Function Tests: Recent Labs  Lab 02/19/21 0007  AST 15  ALT 16  ALKPHOS 92  BILITOT 0.8  PROT 7.1  ALBUMIN 3.4*   No results for input(s): LIPASE, AMYLASE in the last 168 hours. No results for input(s): AMMONIA in the last 168 hours. Coagulation Profile: Recent Labs  Lab 02/19/21 0039  INR 3.6*   Cardiac Enzymes: No results for input(s): CKTOTAL, CKMB, CKMBINDEX, TROPONINI in the last 168 hours. BNP (last 3 results) No results for input(s): PROBNP in the last 8760 hours. HbA1C: No results for input(s): HGBA1C in the last 72 hours. CBG: No results for input(s): GLUCAP in the last 168 hours. Lipid Profile: No results for input(s): CHOL, HDL, LDLCALC, TRIG, CHOLHDL, LDLDIRECT in the last 72 hours. Thyroid Function Tests: No results for input(s): TSH, T4TOTAL, FREET4, T3FREE, THYROIDAB in the last 72 hours. Anemia Panel: No results for input(s): VITAMINB12, FOLATE, FERRITIN, TIBC, IRON, RETICCTPCT in the last 72 hours. Urine analysis:    Component Value Date/Time   COLORURINE AMBER (A) 11/12/2017 2006   APPEARANCEUR HAZY (A) 11/12/2017 2006   LABSPEC 1.024 11/12/2017 2006    PHURINE 5.0 11/12/2017 2006   GLUCOSEU NEGATIVE 11/12/2017 2006   HGBUR  NEGATIVE 11/12/2017 2006   BILIRUBINUR SMALL (A) 11/12/2017 2006   KETONESUR 5 (A) 11/12/2017 2006   PROTEINUR 100 (A) 11/12/2017 2006   UROBILINOGEN 0.2 08/20/2011 1711   NITRITE NEGATIVE 11/12/2017 2006   LEUKOCYTESUR NEGATIVE 11/12/2017 2006    Radiological Exams on Admission - Personally Reviewed: CT Soft Tissue Neck W Contrast  Result Date: 02/19/2021 CLINICAL DATA:  80 year old male with history of tongue cancer. New left facial swelling, also tenderness in the right submandibular area. EXAM: CT NECK WITH CONTRAST TECHNIQUE: Multidetector CT imaging of the neck was performed using the standard protocol following the bolus administration of intravenous contrast. CONTRAST:  75mL OMNIPAQUE IOHEXOL 300 MG/ML  SOLN COMPARISON:  Face CT today reported separately. PET-CT 01/09/2019.  Prior neck CT 09/01/2017, 11/10/2016. FINDINGS: Pharynx and larynx: Mild generalized pharyngeal mucosal space thickening compatible with prior XRT. Trace retained secretions in the oropharynx. Pharyngeal and laryngeal soft tissue contours are within normal limits. No discrete residual right base of tongue mass. Parapharyngeal and retropharyngeal spaces are within normal limits. Salivary glands: Residual asymmetric sublingual space soft tissue on series 5, image 57, although not significantly changed compared to 09/01/2017 and regressed from the initial neck CT. No significant sublingual mass effect. Post XRT atrophy of the bilateral submandibular glands. Parotid glands are better preserved and within normal limits. Thyroid: Negative. Lymph nodes: Matted soft tissue along the right level 2 and level 3 lymph node stations (series 1, image 51) which is regressed compared to 09/01/2017 and the site of the dominant malignant nodes on the presentation exam. Small bilateral level 1 nodes. No discrete lymphadenopathy today. Vascular: Major vascular structures  in the neck and at the skull base are patent. Right IJ remains in place. Bilateral carotid bifurcation calcified atherosclerosis worse on the left. Codominant vertebral arteries. V4 segment and bulky left greater than right visible ICA siphon calcified plaque. Limited intracranial: Negative. Visualized orbits: Postoperative changes to both globes. Asymmetric left preseptal and left facial soft tissue swelling in conjunction with abnormal perioral soft tissue swelling. See maxillofacial CT reported separately. Mastoids and visualized paranasal sinuses: See face CT reported separately. Skeleton: Carious dentition. See face CT reported separately. Skull base and cervical spine appear stable. Osteopenia. But no osseous metastatic disease identified. Congenital incomplete ossification of the posterior C1 ring. Upper chest: Partially visible left chest pacemaker device. Little of the superior mediastinum is included. Negative lung apices. IMPRESSION: 1. Acute inflammatory perioral, left face, and left periorbital process is detailed on the Face CT reported separately. 2. Satisfactory post treatment appearance of the neck. Post XRT changes. Residual asymmetric soft tissue in the right sublingual space and right level 2/3 lymph node stations corresponding to sites of treated tumor in 2018. 3. Calcified carotid atherosclerosis. Electronically Signed   By: Odessa Fleming M.D.   On: 02/19/2021 04:16   CT Maxillofacial W Contrast  Result Date: 02/19/2021 CLINICAL DATA:  80 year old male with history of tongue cancer. New left facial swelling, also tenderness in the right submandibular area. EXAM: CT MAXILLOFACIAL WITH CONTRAST TECHNIQUE: Multidetector CT imaging of the maxillofacial structures was performed with intravenous contrast. Multiplanar CT image reconstructions were also generated. CONTRAST:  75mL OMNIPAQUE IOHEXOL 300 MG/ML SOLN in conjunction with contrast enhanced imaging of the neck reported separately. COMPARISON:   Neck CT reported separately. FINDINGS: Osseous: Extensively carious dentition. Mandible is intact and normally located. Carious anterior left mandible molar with periapical lucency and lateral mandible body dehiscence (series 4, image 25) which is in proximity to the left  lateral facial soft tissue inflammation. Poor maxillary dentition also with periapical lucency at the left anterior maxillary bicuspid which is in proximity to the periorbital soft tissue inflammation. And along the anterior maxillary body overlying the carious left lateral incisor where additional periapical lucency is noted there is a small semicircular roughly 9 mm subperiosteal abscess on series 3, image 52. Additional generalized periorbital edema and soft tissue swelling which tracks up the left nasal labial fold. No other No acute osseous abnormality identified. Orbits: Intact orbital walls. Globes and intraorbital soft tissues appear intact. There is left side preseptal soft tissue swelling and stranding. No soft tissue gas. No periorbital fluid collection. Sinuses: Opacified left maxillary sinus with mucoperiosteal thickening and poor left maxillary dentition. Contralateral mild to moderate right maxillary mucoperiosteal thickening. Subtotal opacification of the left anterior ethmoid air cells. Bubbly opacity in the left frontal sinus. Opacified left OMC. Small volume retained secretions in the left nasopharynx. Tympanic cavities and mastoids are clear. Soft tissues: Widespread soft tissue inflammation in the left face, perioral soft tissues eccentric to the left, and left preseptal space as detailed above. Other deep space face and neck soft tissue findings are reported on the neck CT today. Limited intracranial: ICA siphon and distal vertebral artery calcified atherosclerosis greater on the left. Visible brain parenchyma is within normal limits for age. IMPRESSION: 1. Poor dentition with multifocal odontogenic cellulitis suspected: -  perioral cellulitis tracking up the left nasal labial fold with a 9 mm subperiosteal abscess overlying the anterior left maxillary alveolus, and likely associated with the left maxillary lateral incisor and/or anterior bicuspid. - left mandible molar periapical lucency and dehiscence at the lateral mandible body with regional left face inflammation. - preseptal left orbit involvement also, but no other complicating features. 2. Left OMC obstructive pattern of acute on chronic paranasal sinus disease, possibly also odontogenic originating from the left maxillary sinus. 3. Other Neck restaging is detailed on the Neck CT today. Electronically Signed   By: Odessa Fleming M.D.   On: 02/19/2021 04:26   DG Chest Portable 1 View  Result Date: 02/19/2021 CLINICAL DATA:  Shortness of breath EXAM: PORTABLE CHEST 1 VIEW COMPARISON:  08/08/2018 FINDINGS: Left AICD remains in place, unchanged. Cardiomegaly. No confluent opacities, effusions or edema. No acute bony abnormality. IMPRESSION: Cardiomegaly.  No active disease. Electronically Signed   By: Charlett Nose M.D.   On: 02/19/2021 00:28    EKG: Personally reviewed.  Rhythm is atrial fibrillation with heart rate of 89 bpm.  No dynamic ST segment changes appreciated.  Assessment/Plan Principal Problem:   Facial cellulitis secondary to odontogenic infection  Patient placed on intravenous Unasyn and clindamycin by the emergency department staff Additionally there is a 1 L subperiosteal abscess overlying the anterior left maxillary alveolus. Will treat with Unasyn monotherapy for now Patient will ultimately need OMFS evaluation, will attempt to contact them on day shift for consultation and extraction of broken teeth. Blood cultures obtained MRSA PCR ordered If MRSA PCR is positive we will add intravenous vancomycin.  Active Problems:    PAF (paroxysmal atrial fibrillation) (HCC)  Temporarily holding Coumadin in preparation for possible dental procedure Continuing  home regimen of AV nodal blocking agents Patient is rate controlled Continuing to monitor patient on telemetry    Type 2 diabetes mellitus without complication, without long-term current use of insulin (HCC)  Patient been placed on Accu-Cheks before every meal and nightly with sliding scale insulin Hemoglobin A1C ordered Diabetic Diet    Mixed hyperlipidemia  Continue home regimen of statin therapy  Nonischemic cardiomyopathy  No evidence of cardiogenic volume overload at this time Temporarily holding torsemide  Dementia  Mild/moderate as patient is still a good historian and able to make his own medical decisions Continue home regimen of Aricept and Namenda  Code Status:  Full code Family Communication: Deferred  Status is: Inpatient  Remains inpatient appropriate because:Ongoing diagnostic testing needed not appropriate for outpatient work up, IV treatments appropriate due to intensity of illness or inability to take PO, and Inpatient level of care appropriate due to severity of illness  Dispo: The patient is from: Home              Anticipated d/c is to: Home              Patient currently is not medically stable to d/c.   Difficult to place patient Yes        Marinda Elk MD [facility] Pager [phone]  If 7PM-7AM, please contact night-coverage [email or web address] Use universal [place] password for that web site. If you do not have the password, please call the hospital operator.  02/19/2021, 7:23 AM

## 2021-02-19 NOTE — Plan of Care (Signed)

## 2021-02-19 NOTE — Progress Notes (Signed)
[facility] PROGRESS NOTE    Progress Note  Collin [name]  ZOX:[id number] DOB: Feb 27, 1941 DOA: 02/18/2021 PCP: [name], MD     Brief Narrative:   Collin [name] is an 80 y.o. male past medical history of paroxysmal atrial fibrillation on Coumadin, nonischemic cardiomyopathy status post AICD placement with an echo in 2018 that showed an EF of 50% tongue cancer status post radiation 2019 and dementia comes into: Hospital for facial swelling 4 days prior to admission he has been having increased facial swelling associated with redness.  He complains of dental pain.  CT scan showed poor dentition with multifocal odontogenic cellulitis in addition to 9 mm subperiosteal abscess overlying the anterior left maxilla alveolus.  He was started empirically on IV Unasyn and clindamycin in the ED    Assessment/Plan:   Facial swelling likely due to dental abscess: Criteria no leukocytosis has remained afebrile you have some local facial swelling he relates he sleeps on the left side. He was started empirically on IV Unasyn which we will continue. CT of maxillofacial showed poor dentition and multiple odontogenic abscesses with a 9 mm overlying the anterior left maxilla alveolus. Head and neck showed acute inflammatory Perry oral left face periorbital. There is some residual asymmetric soft tissue in the right sublingual space with 2 out of 3 lymph nodes in the right side corresponding to the site of the treated tumor in 2018.  Paroxysmal atrial fibrillation: INR was 3.6 hold Coumadin for now. Nodal blocking agents.  Diabetes mellitus type 2 without complications: Continue sliding scale insulin carb modified diet. Hemoglobin A1c is pending.  Mixed hyperlipidemia:  Excellent continue statins.  Nonischemic cardiomyopathy: Appears euvolemic resume torsemide.  Mild dementia: Continue Aricept and Namenda.     DVT prophylaxis: coumadin Family Communication:  Daughter Status is: Inpatient  Not inpatient appropriate, will call [facility] team and downgrade to OBS.   Dispo: The patient is from: Home              Anticipated d/c is to: Home              Patient currently is not medically stable to d/c.   Difficult to place patient No        Code Status:     Code Status Orders  (From admission, onward)           Start     Ordered   02/19/21 0719  Full code  Continuous        02/19/21 0722           Code Status History     Date Active Date Inactive Code Status Order ID Comments User Context   01/18/2018 2036 01/19/2018 1718 Full Code [id number]  [name], MD Inpatient   11/12/2017 2323 11/18/2017 1829 Full Code [id number]  Briscoe Deutscher, MD ED   07/25/2017 1307 07/26/2017 2329 Full Code [id number]  [name], MD Inpatient   11/11/2016 0250 11/17/2016 1619 Full Code [id number]  Eduard Clos, MD ED   05/10/2014 1118 05/10/2014 1542 Full Code [id number]  [name], MD Inpatient   08/21/2011 0140 08/22/2011 1834 Full Code [id number]  Judie Bonus, RN ED         IV Access:   Peripheral IV   Procedures and diagnostic studies:   CT Soft Tissue Neck W Contrast  Result Date: 02/19/2021 CLINICAL DATA:  80 year old male with history of tongue cancer. New left facial swelling, also tenderness in the  right submandibular area. EXAM: CT NECK WITH CONTRAST TECHNIQUE: Multidetector CT imaging of the neck was performed using the standard protocol following the bolus administration of intravenous contrast. CONTRAST:  75mL OMNIPAQUE IOHEXOL 300 MG/ML  SOLN COMPARISON:  Face CT today reported separately. PET-CT 01/09/2019.  Prior neck CT 09/01/2017, 11/10/2016. FINDINGS: Pharynx and larynx: Mild generalized pharyngeal mucosal space thickening compatible with prior XRT. Trace retained secretions in the oropharynx. Pharyngeal and laryngeal soft tissue contours are within normal limits. No discrete residual right base of tongue  mass. Parapharyngeal and retropharyngeal spaces are within normal limits. Salivary glands: Residual asymmetric sublingual space soft tissue on series 5, image 57, although not significantly changed compared to 09/01/2017 and regressed from the initial neck CT. No significant sublingual mass effect. Post XRT atrophy of the bilateral submandibular glands. Parotid glands are better preserved and within normal limits. Thyroid: Negative. Lymph nodes: Matted soft tissue along the right level 2 and level 3 lymph node stations (series 1, image 51) which is regressed compared to 09/01/2017 and the site of the dominant malignant nodes on the presentation exam. Small bilateral level 1 nodes. No discrete lymphadenopathy today. Vascular: Major vascular structures in the neck and at the skull base are patent. Right IJ remains in place. Bilateral carotid bifurcation calcified atherosclerosis worse on the left. Codominant vertebral arteries. V4 segment and bulky left greater than right visible ICA siphon calcified plaque. Limited intracranial: Negative. Visualized orbits: Postoperative changes to both globes. Asymmetric left preseptal and left facial soft tissue swelling in conjunction with abnormal perioral soft tissue swelling. See maxillofacial CT reported separately. Mastoids and visualized paranasal sinuses: See face CT reported separately. Skeleton: Carious dentition. See face CT reported separately. Skull base and cervical spine appear stable. Osteopenia. But no osseous metastatic disease identified. Congenital incomplete ossification of the posterior C1 ring. Upper chest: Partially visible left chest pacemaker device. Little of the superior mediastinum is included. Negative lung apices. IMPRESSION: 1. Acute inflammatory perioral, left face, and left periorbital process is detailed on the Face CT reported separately. 2. Satisfactory post treatment appearance of the neck. Post XRT changes. Residual asymmetric soft tissue in  the right sublingual space and right level 2/3 lymph node stations corresponding to sites of treated tumor in 2018. 3. Calcified carotid atherosclerosis. Electronically Signed   By: Odessa Fleming M.D.   On: 02/19/2021 04:16   CT Maxillofacial W Contrast  Result Date: 02/19/2021 CLINICAL DATA:  80 year old male with history of tongue cancer. New left facial swelling, also tenderness in the right submandibular area. EXAM: CT MAXILLOFACIAL WITH CONTRAST TECHNIQUE: Multidetector CT imaging of the maxillofacial structures was performed with intravenous contrast. Multiplanar CT image reconstructions were also generated. CONTRAST:  75mL OMNIPAQUE IOHEXOL 300 MG/ML SOLN in conjunction with contrast enhanced imaging of the neck reported separately. COMPARISON:  Neck CT reported separately. FINDINGS: Osseous: Extensively carious dentition. Mandible is intact and normally located. Carious anterior left mandible molar with periapical lucency and lateral mandible body dehiscence (series 4, image 25) which is in proximity to the left lateral facial soft tissue inflammation. Poor maxillary dentition also with periapical lucency at the left anterior maxillary bicuspid which is in proximity to the periorbital soft tissue inflammation. And along the anterior maxillary body overlying the carious left lateral incisor where additional periapical lucency is noted there is a small semicircular roughly 9 mm subperiosteal abscess on series 3, image 52. Additional generalized periorbital edema and soft tissue swelling which tracks up the left nasal labial fold. No other No  acute osseous abnormality identified. Orbits: Intact orbital walls. Globes and intraorbital soft tissues appear intact. There is left side preseptal soft tissue swelling and stranding. No soft tissue gas. No periorbital fluid collection. Sinuses: Opacified left maxillary sinus with mucoperiosteal thickening and poor left maxillary dentition. Contralateral mild to moderate  right maxillary mucoperiosteal thickening. Subtotal opacification of the left anterior ethmoid air cells. Bubbly opacity in the left frontal sinus. Opacified left OMC. Small volume retained secretions in the left nasopharynx. Tympanic cavities and mastoids are clear. Soft tissues: Widespread soft tissue inflammation in the left face, perioral soft tissues eccentric to the left, and left preseptal space as detailed above. Other deep space face and neck soft tissue findings are reported on the neck CT today. Limited intracranial: ICA siphon and distal vertebral artery calcified atherosclerosis greater on the left. Visible brain parenchyma is within normal limits for age. IMPRESSION: 1. Poor dentition with multifocal odontogenic cellulitis suspected: - perioral cellulitis tracking up the left nasal labial fold with a 9 mm subperiosteal abscess overlying the anterior left maxillary alveolus, and likely associated with the left maxillary lateral incisor and/or anterior bicuspid. - left mandible molar periapical lucency and dehiscence at the lateral mandible body with regional left face inflammation. - preseptal left orbit involvement also, but no other complicating features. 2. Left OMC obstructive pattern of acute on chronic paranasal sinus disease, possibly also odontogenic originating from the left maxillary sinus. 3. Other Neck restaging is detailed on the Neck CT today. Electronically Signed   By: Odessa Fleming M.D.   On: 02/19/2021 04:26   DG Chest Portable 1 View  Result Date: 02/19/2021 CLINICAL DATA:  Shortness of breath EXAM: PORTABLE CHEST 1 VIEW COMPARISON:  08/08/2018 FINDINGS: Left AICD remains in place, unchanged. Cardiomegaly. No confluent opacities, effusions or edema. No acute bony abnormality. IMPRESSION: Cardiomegaly.  No active disease. Electronically Signed   By: Charlett Nose M.D.   On: 02/19/2021 00:28     Medical Consultants:   None.   Subjective:    [name] no complaints he  relates some pain when he chews on that side.  Objective:    Vitals:   02/19/21 0900 02/19/21 1000 02/19/21 1100 02/19/21 1200  BP: 117/76 111/83 115/81 (!) 150/134  Pulse: 77 (!) 104 80 62  Resp: 19 (!) 24 14 12   Temp:      TempSrc:      SpO2: 99% 97% 98% 98%  Weight:      Height:       SpO2: 98 %  No intake or output data in the 24 hours ending 02/19/21 1356 Filed Weights   02/18/21 2205  Weight: 97.5 kg    Exam: General exam: In no acute distress left facial swelling with some mild erythema. Respiratory system: Good air movement and clear to auscultation. Cardiovascular system: S1 & S2 heard, RRR. No JVD. Gastrointestinal system: Abdomen is nondistended, soft and nontender.  Extremities: No pedal edema. Skin: No rashes, lesions or ulcers Psychiatry: Judgement and insight appear normal. Mood & affect appropriate.    Data Reviewed:    Labs: Basic Metabolic Panel: Recent Labs  Lab 02/19/21 0007  NA 137  K 4.0  CL 103  CO2 26  GLUCOSE 130*  BUN 15  CREATININE 0.87  CALCIUM 8.8*   GFR Estimated Creatinine Clearance: 83.2 mL/min (by C-G formula based on SCr of 0.87 mg/dL). Liver Function Tests: Recent Labs  Lab 02/19/21 0007  AST 15  ALT 16  ALKPHOS 92  BILITOT 0.8  PROT 7.1  ALBUMIN 3.4*   No results for input(s): LIPASE, AMYLASE in the last 168 hours. No results for input(s): AMMONIA in the last 168 hours. Coagulation profile Recent Labs  Lab 02/19/21 0039  INR 3.6*   COVID-19 Labs  No results for input(s): DDIMER, FERRITIN, LDH, CRP in the last 72 hours.  No results found for: SARSCOV2NAA  CBC: Recent Labs  Lab 02/19/21 0007  WBC 6.4  NEUTROABS 5.2  HGB 13.2  HCT 40.5  MCV 93.8  PLT 180   Cardiac Enzymes: No results for input(s): CKTOTAL, CKMB, CKMBINDEX, TROPONINI in the last 168 hours. BNP (last 3 results) No results for input(s): PROBNP in the last 8760 hours. CBG: Recent Labs  Lab 02/19/21 0901 02/19/21 1220  GLUCAP  110* 113*   D-Dimer: No results for input(s): DDIMER in the last 72 hours. Hgb A1c: No results for input(s): HGBA1C in the last 72 hours. Lipid Profile: No results for input(s): CHOL, HDL, LDLCALC, TRIG, CHOLHDL, LDLDIRECT in the last 72 hours. Thyroid function studies: No results for input(s): TSH, T4TOTAL, T3FREE, THYROIDAB in the last 72 hours.  Invalid input(s): FREET3 Anemia work up: No results for input(s): VITAMINB12, FOLATE, FERRITIN, TIBC, IRON, RETICCTPCT in the last 72 hours. Sepsis Labs: Recent Labs  Lab 02/19/21 0007 02/19/21 0131  WBC 6.4  --   LATICACIDVEN  --  0.9   Microbiology No results found for this or any previous visit (from the past 240 hour(s)).   Medications:    carvedilol  25 mg Oral BID WC   diltiazem  90 mg Oral Daily   donepezil  10 mg Oral Daily   insulin aspart  0-15 Units Subcutaneous TID AC & HS   PARoxetine  20 mg Oral Daily   rosuvastatin  10 mg Oral Daily   Continuous Infusions:  ampicillin-sulbactam (UNASYN) IV Stopped (02/19/21 0636)      LOS: 0 days   Marinda Elk  [facility]  02/19/2021, 1:56 PM

## 2021-02-20 LAB — CBC WITH DIFFERENTIAL/PLATELET
Abs Immature Granulocytes: 0.02 10*3/uL (ref 0.00–0.07)
Basophils Absolute: 0 10*3/uL (ref 0.0–0.1)
Basophils Relative: 0 %
Eosinophils Absolute: 0 10*3/uL (ref 0.0–0.5)
Eosinophils Relative: 1 %
HCT: 37.6 % — ABNORMAL LOW (ref 39.0–52.0)
Hemoglobin: 12.4 g/dL — ABNORMAL LOW (ref 13.0–17.0)
Immature Granulocytes: 0 %
Lymphocytes Relative: 6 %
Lymphs Abs: 0.3 10*3/uL — ABNORMAL LOW (ref 0.7–4.0)
MCH: 30.7 pg (ref 26.0–34.0)
MCHC: 33 g/dL (ref 30.0–36.0)
MCV: 93.1 fL (ref 80.0–100.0)
Monocytes Absolute: 0.5 10*3/uL (ref 0.1–1.0)
Monocytes Relative: 9 %
Neutro Abs: 4.3 10*3/uL (ref 1.7–7.7)
Neutrophils Relative %: 84 %
Platelets: 187 10*3/uL (ref 150–400)
RBC: 4.04 MIL/uL — ABNORMAL LOW (ref 4.22–5.81)
RDW: 14.6 % (ref 11.5–15.5)
WBC: 5.1 10*3/uL (ref 4.0–10.5)
nRBC: 0 % (ref 0.0–0.2)

## 2021-02-20 LAB — GLUCOSE, CAPILLARY
Glucose-Capillary: 107 mg/dL — ABNORMAL HIGH (ref 70–99)
Glucose-Capillary: 113 mg/dL — ABNORMAL HIGH (ref 70–99)
Glucose-Capillary: 125 mg/dL — ABNORMAL HIGH (ref 70–99)
Glucose-Capillary: 86 mg/dL (ref 70–99)

## 2021-02-20 LAB — COMPREHENSIVE METABOLIC PANEL
ALT: 13 U/L (ref 0–44)
AST: 13 U/L — ABNORMAL LOW (ref 15–41)
Albumin: 3.1 g/dL — ABNORMAL LOW (ref 3.5–5.0)
Alkaline Phosphatase: 88 U/L (ref 38–126)
Anion gap: 10 (ref 5–15)
BUN: 16 mg/dL (ref 8–23)
CO2: 27 mmol/L (ref 22–32)
Calcium: 8.9 mg/dL (ref 8.9–10.3)
Chloride: 102 mmol/L (ref 98–111)
Creatinine, Ser: 0.94 mg/dL (ref 0.61–1.24)
GFR, Estimated: >60 mL/min (ref >60)
Glucose, Bld: 101 mg/dL — ABNORMAL HIGH (ref 70–99)
Potassium: 3.8 mmol/L (ref 3.5–5.1)
Sodium: 139 mmol/L (ref 135–145)
Total Bilirubin: 1 mg/dL (ref 0.3–1.2)
Total Protein: 6.6 g/dL (ref 6.5–8.1)

## 2021-02-20 LAB — HEMOGLOBIN A1C
Hgb A1c MFr Bld: 6 % — ABNORMAL HIGH (ref 4.8–5.6)
Mean Plasma Glucose: 126 mg/dL

## 2021-02-20 LAB — PROTIME-INR
INR: 3.8 — ABNORMAL HIGH (ref 0.8–1.2)
Prothrombin Time: 37.1 seconds — ABNORMAL HIGH (ref 11.4–15.2)

## 2021-02-20 LAB — MAGNESIUM: Magnesium: 2.2 mg/dL (ref 1.7–2.4)

## 2021-02-20 MED ORDER — INSULIN ASPART 100 UNIT/ML IJ SOLN
2.0000 [IU] | Freq: Three times a day (TID) | INTRAMUSCULAR | Status: DC
  Administered 2021-02-20 – 2021-02-21 (×4): 2 [IU] via SUBCUTANEOUS

## 2021-02-20 MED ORDER — SODIUM CHLORIDE 0.9 % IV SOLN
INTRAVENOUS | Status: DC | PRN
  Administered 2021-02-20: 250 mL via INTRAVENOUS

## 2021-02-20 MED ORDER — INSULIN ASPART 100 UNIT/ML IJ SOLN: 0.0000 [IU] | Freq: Every day | INTRAMUSCULAR | Status: DC

## 2021-02-20 MED ORDER — INSULIN ASPART 100 UNIT/ML IJ SOLN
0.0000 [IU] | Freq: Three times a day (TID) | INTRAMUSCULAR | Status: DC
  Administered 2021-02-20 – 2021-02-21 (×2): 2 [IU] via SUBCUTANEOUS

## 2021-02-20 NOTE — Plan of Care (Signed)

## 2021-02-20 NOTE — Progress Notes (Signed)
[facility] PROGRESS NOTE    Progress Note  Collin [name]  DUK:[id number] DOB: Jan 31, 1941 DOA: 02/18/2021 PCP: [name], MD     Brief Narrative:   Collin [name] is an 80 y.o. male past medical history of paroxysmal atrial fibrillation on Coumadin, nonischemic cardiomyopathy status post AICD placement with an echo in 2018 that showed an EF of 50% tongue cancer status post radiation 2019 and dementia comes into: Hospital for facial swelling 4 days prior to admission he has been having increased facial swelling associated with redness.  He complains of dental pain.  CT scan showed poor dentition with multifocal odontogenic cellulitis in addition to 9 mm subperiosteal abscess overlying the anterior left maxilla alveolus.  He was started empirically on IV Unasyn and clindamycin in the ED    Assessment/Plan:   Facial swelling likely due to dental abscess: He has no criteria for sepsis.  Sepsis has been ruled out.   Continues to make remained afebrile with no leukocytosis or left shift. CT of the maxillofacial also 9 mm abscess. CT of the head and neck showed acute inflammatory process and some residual symmetry of the sublingual space was 2-3 lymph nodes in the area, corresponding to the site of the treated tumor in 2018.  He will need follow-up with a repeat a CT as an outpatient. Swelling is significantly improved he relates he also feels better, the wife who is at bedside relates that his swelling is significantly improved continue to apply cold compresses continue IV antibiotics.  Paroxysmal atrial fibrillation: INR pending today Coumadin was held as his INR was supratherapeutic. On no rate controlling agents.  Diabetes mellitus type 2 without complications: With a hemoglobin A1c of 6.0, continue sliding scale insulin with carb modified diet. Continue sliding scale insulin carb modified diet. Hemoglobin A1c is pending.  Mixed hyperlipidemia:  Excellent continue  statins.  Nonischemic cardiomyopathy: Appears euvolemic resume torsemide.  Mild dementia: Continue Aricept and Namenda.     DVT prophylaxis: coumadin Family Communication: Daughter Status is: Inpatient  Not inpatient appropriate, will call [facility] team and downgrade to OBS.   Dispo: The patient is from: Home              Anticipated d/c is to: Home              Patient currently is not medically stable to d/c.   Difficult to place patient No        Code Status:     Code Status Orders  (From admission, onward)           Start     Ordered   02/19/21 0719  Full code  Continuous        02/19/21 0722           Code Status History     Date Active Date Inactive Code Status Order ID Comments User Context   01/18/2018 2036 01/19/2018 1718 Full Code [id number]  [name], MD Inpatient   11/12/2017 2323 11/18/2017 1829 Full Code [id number]  Briscoe Deutscher, MD ED   07/25/2017 1307 07/26/2017 2329 Full Code [id number]  [name], MD Inpatient   11/11/2016 0250 11/17/2016 1619 Full Code [id number]  Eduard Clos, MD ED   05/10/2014 1118 05/10/2014 1542 Full Code [id number]  [name], MD Inpatient   08/21/2011 0140 08/22/2011 1834 Full Code [id number]  Judie Bonus, RN ED         IV Access:   Peripheral  IV   Procedures and diagnostic studies:   CT Soft Tissue Neck W Contrast  Result Date: 02/19/2021 CLINICAL DATA:  80 year old male with history of tongue cancer. New left facial swelling, also tenderness in the right submandibular area. EXAM: CT NECK WITH CONTRAST TECHNIQUE: Multidetector CT imaging of the neck was performed using the standard protocol following the bolus administration of intravenous contrast. CONTRAST:  75mL OMNIPAQUE IOHEXOL 300 MG/ML  SOLN COMPARISON:  Face CT today reported separately. PET-CT 01/09/2019.  Prior neck CT 09/01/2017, 11/10/2016. FINDINGS: Pharynx and larynx: Mild generalized pharyngeal mucosal space thickening  compatible with prior XRT. Trace retained secretions in the oropharynx. Pharyngeal and laryngeal soft tissue contours are within normal limits. No discrete residual right base of tongue mass. Parapharyngeal and retropharyngeal spaces are within normal limits. Salivary glands: Residual asymmetric sublingual space soft tissue on series 5, image 57, although not significantly changed compared to 09/01/2017 and regressed from the initial neck CT. No significant sublingual mass effect. Post XRT atrophy of the bilateral submandibular glands. Parotid glands are better preserved and within normal limits. Thyroid: Negative. Lymph nodes: Matted soft tissue along the right level 2 and level 3 lymph node stations (series 1, image 51) which is regressed compared to 09/01/2017 and the site of the dominant malignant nodes on the presentation exam. Small bilateral level 1 nodes. No discrete lymphadenopathy today. Vascular: Major vascular structures in the neck and at the skull base are patent. Right IJ remains in place. Bilateral carotid bifurcation calcified atherosclerosis worse on the left. Codominant vertebral arteries. V4 segment and bulky left greater than right visible ICA siphon calcified plaque. Limited intracranial: Negative. Visualized orbits: Postoperative changes to both globes. Asymmetric left preseptal and left facial soft tissue swelling in conjunction with abnormal perioral soft tissue swelling. See maxillofacial CT reported separately. Mastoids and visualized paranasal sinuses: See face CT reported separately. Skeleton: Carious dentition. See face CT reported separately. Skull base and cervical spine appear stable. Osteopenia. But no osseous metastatic disease identified. Congenital incomplete ossification of the posterior C1 ring. Upper chest: Partially visible left chest pacemaker device. Little of the superior mediastinum is included. Negative lung apices. IMPRESSION: 1. Acute inflammatory perioral, left face,  and left periorbital process is detailed on the Face CT reported separately. 2. Satisfactory post treatment appearance of the neck. Post XRT changes. Residual asymmetric soft tissue in the right sublingual space and right level 2/3 lymph node stations corresponding to sites of treated tumor in 2018. 3. Calcified carotid atherosclerosis. Electronically Signed   By: Odessa Fleming M.D.   On: 02/19/2021 04:16   CT Maxillofacial W Contrast  Result Date: 02/19/2021 CLINICAL DATA:  80 year old male with history of tongue cancer. New left facial swelling, also tenderness in the right submandibular area. EXAM: CT MAXILLOFACIAL WITH CONTRAST TECHNIQUE: Multidetector CT imaging of the maxillofacial structures was performed with intravenous contrast. Multiplanar CT image reconstructions were also generated. CONTRAST:  75mL OMNIPAQUE IOHEXOL 300 MG/ML SOLN in conjunction with contrast enhanced imaging of the neck reported separately. COMPARISON:  Neck CT reported separately. FINDINGS: Osseous: Extensively carious dentition. Mandible is intact and normally located. Carious anterior left mandible molar with periapical lucency and lateral mandible body dehiscence (series 4, image 25) which is in proximity to the left lateral facial soft tissue inflammation. Poor maxillary dentition also with periapical lucency at the left anterior maxillary bicuspid which is in proximity to the periorbital soft tissue inflammation. And along the anterior maxillary body overlying the carious left lateral incisor where additional periapical  lucency is noted there is a small semicircular roughly 9 mm subperiosteal abscess on series 3, image 52. Additional generalized periorbital edema and soft tissue swelling which tracks up the left nasal labial fold. No other No acute osseous abnormality identified. Orbits: Intact orbital walls. Globes and intraorbital soft tissues appear intact. There is left side preseptal soft tissue swelling and stranding. No soft  tissue gas. No periorbital fluid collection. Sinuses: Opacified left maxillary sinus with mucoperiosteal thickening and poor left maxillary dentition. Contralateral mild to moderate right maxillary mucoperiosteal thickening. Subtotal opacification of the left anterior ethmoid air cells. Bubbly opacity in the left frontal sinus. Opacified left OMC. Small volume retained secretions in the left nasopharynx. Tympanic cavities and mastoids are clear. Soft tissues: Widespread soft tissue inflammation in the left face, perioral soft tissues eccentric to the left, and left preseptal space as detailed above. Other deep space face and neck soft tissue findings are reported on the neck CT today. Limited intracranial: ICA siphon and distal vertebral artery calcified atherosclerosis greater on the left. Visible brain parenchyma is within normal limits for age. IMPRESSION: 1. Poor dentition with multifocal odontogenic cellulitis suspected: - perioral cellulitis tracking up the left nasal labial fold with a 9 mm subperiosteal abscess overlying the anterior left maxillary alveolus, and likely associated with the left maxillary lateral incisor and/or anterior bicuspid. - left mandible molar periapical lucency and dehiscence at the lateral mandible body with regional left face inflammation. - preseptal left orbit involvement also, but no other complicating features. 2. Left OMC obstructive pattern of acute on chronic paranasal sinus disease, possibly also odontogenic originating from the left maxillary sinus. 3. Other Neck restaging is detailed on the Neck CT today. Electronically Signed   By: Odessa Fleming M.D.   On: 02/19/2021 04:26   DG Chest Portable 1 View  Result Date: 02/19/2021 CLINICAL DATA:  Shortness of breath EXAM: PORTABLE CHEST 1 VIEW COMPARISON:  08/08/2018 FINDINGS: Left AICD remains in place, unchanged. Cardiomegaly. No confluent opacities, effusions or edema. No acute bony abnormality. IMPRESSION: Cardiomegaly.  No  active disease. Electronically Signed   By: Charlett Nose M.D.   On: 02/19/2021 00:28     Medical Consultants:   None.   Subjective:    Collin [name] relates he is feels better tolerating his diet no new complaints.  Objective:    Vitals:   02/19/21 2000 02/19/21 2334 02/20/21 0353 02/20/21 0743  BP: 97/65 97/67 97/64  98/69  Pulse: 67 79 (!) 51 99  Resp: 17 18 17 18   Temp: 98.6 F (37 C) 98.6 F (37 C) 98.7 F (37.1 C) 98.2 F (36.8 C)  TempSrc: Oral Oral Oral Oral  SpO2: 99% 99% 98% 98%  Weight:      Height:       SpO2: 98 % O2 Flow Rate (L/min): 2 L/min   Intake/Output Summary (Last 24 hours) at 02/20/2021 1004 Last data filed at 02/20/2021 0354 Gross per 24 hour  Intake 160 ml  Output 500 ml  Net -340 ml   Filed Weights   02/18/21 2205  Weight: 97.5 kg    Exam: General exam: In no acute distress. Respiratory system: Good air movement and clear to auscultation. Cardiovascular system: S1 & S2 heard, RRR. No JVD. Gastrointestinal system: Abdomen is nondistended, soft and nontender.  Extremities: No pedal edema. Skin: No rashes, lesions or ulcers Psychiatry: Judgement and insight appear normal. Mood & affect appropriate.   Data Reviewed:    Labs: Basic Metabolic Panel: Recent  Labs  Lab 02/19/21 0007 02/20/21 0330  NA 137 139  K 4.0 3.8  CL 103 102  CO2 26 27  GLUCOSE 130* 101*  BUN 15 16  CREATININE 0.87 0.94  CALCIUM 8.8* 8.9  MG  --  2.2    GFR Estimated Creatinine Clearance: 77 mL/min (by C-G formula based on SCr of 0.94 mg/dL). Liver Function Tests: Recent Labs  Lab 02/19/21 0007 02/20/21 0330  AST 15 13*  ALT 16 13  ALKPHOS 92 88  BILITOT 0.8 1.0  PROT 7.1 6.6  ALBUMIN 3.4* 3.1*    No results for input(s): LIPASE, AMYLASE in the last 168 hours. No results for input(s): AMMONIA in the last 168 hours. Coagulation profile Recent Labs  Lab 02/19/21 0039  INR 3.6*    COVID-19 Labs  No results for input(s): DDIMER,  FERRITIN, LDH, CRP in the last 72 hours.  Lab Results  Component Value Date   SARSCOV2NAA NEGATIVE 02/19/2021    CBC: Recent Labs  Lab 02/19/21 0007 02/20/21 0330  WBC 6.4 5.1  NEUTROABS 5.2 4.3  HGB 13.2 12.4*  HCT 40.5 37.6*  MCV 93.8 93.1  PLT 180 187    Cardiac Enzymes: No results for input(s): CKTOTAL, CKMB, CKMBINDEX, TROPONINI in the last 168 hours. BNP (last 3 results) No results for input(s): PROBNP in the last 8760 hours. CBG: Recent Labs  Lab 02/19/21 0901 02/19/21 1220 02/19/21 1822 02/19/21 2114 02/20/21 0629  GLUCAP 110* 113* 118* 123* 107*    D-Dimer: No results for input(s): DDIMER in the last 72 hours. Hgb A1c: Recent Labs    02/19/21 0007  HGBA1C 6.0*   Lipid Profile: No results for input(s): CHOL, HDL, LDLCALC, TRIG, CHOLHDL, LDLDIRECT in the last 72 hours. Thyroid function studies: No results for input(s): TSH, T4TOTAL, T3FREE, THYROIDAB in the last 72 hours.  Invalid input(s): FREET3 Anemia work up: No results for input(s): VITAMINB12, FOLATE, FERRITIN, TIBC, IRON, RETICCTPCT in the last 72 hours. Sepsis Labs: Recent Labs  Lab 02/19/21 0007 02/19/21 0131 02/20/21 0330  WBC 6.4  --  5.1  LATICACIDVEN  --  0.9  --     Microbiology Recent Results (from the past 240 hour(s))  Blood culture (routine x 2)     Status: None (Preliminary result)   Collection Time: 02/19/21 12:14 AM   Specimen: BLOOD  Result Value Ref Range Status   Specimen Description BLOOD RIGHT ARM  Final   Special Requests   Final    BOTTLES DRAWN AEROBIC AND ANAEROBIC Blood Culture adequate volume   Culture   Final    NO GROWTH 1 DAY Performed at [facility], [address], [place], [place] [address]    Report Status PENDING  Incomplete  Blood culture (routine x 2)     Status: None (Preliminary result)   Collection Time: 02/19/21 12:48 AM   Specimen: BLOOD  Result Value Ref Range Status   Specimen Description BLOOD LEFT ARM  Final   Special  Requests   Final    BOTTLES DRAWN AEROBIC AND ANAEROBIC Blood Culture adequate volume   Culture   Final    NO GROWTH 1 DAY Performed at [facility], [address] [place], [place] [address]    Report Status PENDING  Incomplete  SARS CORONAVIRUS 2 (TAT 6-24 HRS) Nasopharyngeal Nasopharyngeal Swab     Status: None   Collection Time: 02/19/21 10:25 AM   Specimen: Nasopharyngeal Swab  Result Value Ref Range Status  SARS Coronavirus 2 NEGATIVE NEGATIVE Final    Comment: (NOTE) SARS-CoV-2 target nucleic acids are NOT DETECTED.  The SARS-CoV-2 RNA is generally detectable in upper and lower respiratory specimens during the acute phase of infection. Negative results do not preclude SARS-CoV-2 infection, do not rule out co-infections with other pathogens, and should not be used as the sole basis for treatment or other patient management decisions. Negative results must be combined with clinical observations, patient history, and epidemiological information. The expected result is Negative.  Fact Sheet for Patients: HairSlick.no  Fact Sheet for Healthcare Providers: quierodirigir.com  This test is not yet approved or cleared by the [place] FDA and  has been authorized for detection and/or diagnosis of SARS-CoV-2 by FDA under an Emergency Use Authorization (EUA). This EUA will remain  in effect (meaning this test can be used) for the duration of the COVID-19 declaration under Se ction 564(b)(1) of the Act, 21 U.S.C. section 360bbb-3(b)(1), unless the authorization is terminated or revoked sooner.  Performed at [facility], [address], [place], [place] [address]      Medications:    carvedilol  25 mg Oral BID WC   diltiazem  90 mg Oral Daily   donepezil  10 mg Oral Daily   insulin aspart  0-15 Units Subcutaneous TID AC & HS   memantine  10 mg Oral BID   PARoxetine  20 mg Oral Daily   rosuvastatin   10 mg Oral Daily   Continuous Infusions:  ampicillin-sulbactam (UNASYN) IV 3 g (02/20/21 0544)      LOS: 1 day   Marinda Elk  [facility]  02/20/2021, 10:04 AM

## 2021-02-20 NOTE — TOC Initial Note (Signed)
Transition of Care [facility]) - Initial/Assessment Note    Patient Details  Name: Collin [name] MRN: [id number] Date of Birth: 07-May-1941  Transition of [facility]) CM/SW Contact:    Kermit Balo, RN Phone Number: 02/20/2021, 12:43 PM  Clinical Narrative:                 Pt lives at home with his spouse. Pt attends adult day care and gets exercise 2x/ day there.  Pts wife does his medications and transportation. TOC following for d/c needs.   Expected Discharge Plan: Home/Self Care Barriers to Discharge: Continued Medical Work up   Patient Goals and CMS Choice        Expected Discharge Plan and Services Expected Discharge Plan: Home/Self Care   Discharge Planning Services: CM Consult   Living arrangements for the past 2 months: Single Family Home                                      Prior Living Arrangements/Services Living arrangements for the past 2 months: Single Family Home Lives with:: Self Patient language and need for interpreter reviewed:: Yes Do you feel safe going back to the place where you live?: Yes      Need for Family Participation in Patient Care: Yes (Comment) Care giver support system in place?: Yes (comment) Current home services: DME, Other (comment) (cane/ walker) Criminal Activity/Legal Involvement Pertinent to Current Situation/Hospitalization: No - Comment as needed  Activities of Daily Living      Permission Sought/Granted                  Emotional Assessment Appearance:: Appears stated age Attitude/Demeanor/Rapport: Engaged Affect (typically observed): Accepting Orientation: : Oriented to Self, Oriented to  Time, Oriented to Situation, Oriented to Place   Psych Involvement: No (comment)  Admission diagnosis:  Dental abscess [K04.7] Left facial numbness [R20.0] Left facial swelling [R22.0] Facial cellulitis [L03.211] Preseptal cellulitis of left eye [L03.213] Patient Active Problem List   Diagnosis Date Noted    Facial swelling 02/19/2021   Odontogenic infection of jaw 02/19/2021   Dental abscess 02/19/2021   Osteoarthritis of both hands 09/19/2019   Malnutrition of moderate degree 11/18/2017   Supratherapeutic INR 11/14/2017   AKI (acute kidney injury) (HCC) 11/12/2017   Vein disorder    Umbilical hernia    Shingles    History of radiation therapy    History of echocardiogram    Heart disease    Dysrhythmia    Claustrophobia    CHF (congestive heart failure) (HCC)    Cancer (HCC)    Atrial fibrillation (HCC)    AICD (automatic cardioverter/defibrillator) present    History of head and neck cancer 07/25/2017   Long term (current) use of anticoagulants 06/22/2017   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   Thrush, oral 12/21/2016   Weight loss, abnormal 12/21/2016   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   Umbilical hernia without obstruction and without gangrene    Pressure injury of skin 11/14/2016   Tongue mass: Per Ct neck 11/10/2016 11/12/2016   SBO (small bowel obstruction) (HCC) 11/11/2016   Encounter for therapeutic drug monitoring 10/25/2013   Diabetic peripheral neuropathy (HCC) 10/14/2011   Hypotension 08/20/2011   Acute renal failure (HCC) 08/20/2011   OSA (obstructive sleep apnea) 04/15/2011   ICD-[employer] 03/30/2011   Chronic diastolic CHF (congestive heart failure) (HCC) 03/30/2011  PAF (paroxysmal atrial fibrillation) (HCC) 12/14/2010   Nonischemic cardiomyopathy (HCC) 12/14/2010   Type 2 diabetes mellitus without complication, without long-term current use of insulin (HCC) 12/14/2010   Mixed hyperlipidemia 12/14/2010   Cardiomyopathy, nonischemic (HCC) 09/06/1996   PCP:  [name], MD Pharmacy:   [facility] [id number] - [place], [place] - [address] [place] [address] Phone: [phone] Fax: [phone]     Social Determinants of Health (SDOH)  Interventions    Readmission Risk Interventions No flowsheet data found.

## 2021-02-20 NOTE — Progress Notes (Signed)
ANTICOAGULATION CONSULT NOTE - Initial Consult  Pharmacy Consult for Warfarin Indication: atrial fibrillation  Allergies  Allergen Reactions   Niaspan [Niacin Er] Other (See Comments)    FLUSHED    Patient Measurements: Height: 6\' 1"  (185.4 cm) Weight: 97.5 kg (215 lb) IBW/kg (Calculated) : 79.9  Vital Signs: Temp: 98.3 F (36.8 C) (06/17 1121) Temp Source: Oral (06/17 1121) BP: 92/72 (06/17 1121) Pulse Rate: 80 (06/17 1121)  Labs: Recent Labs    02/19/21 0007 02/19/21 0039 02/19/21 0209 02/20/21 0330 02/20/21 1053  HGB 13.2  --   --  12.4*  --   HCT 40.5  --   --  37.6*  --   PLT 180  --   --  187  --   LABPROT  --  35.8*  --   --  37.1*  INR  --  3.6*  --   --  3.8*  CREATININE 0.87  --   --  0.94  --   TROPONINIHS 21*  --  22*  --   --     Estimated Creatinine Clearance: 77 mL/min (by C-G formula based on SCr of 0.94 mg/dL).   Medical History: Past Medical History:  Diagnosis Date   AICD (automatic cardioverter/defibrillator) present    Atrial fibrillation ([place])    Cancer ([place])    tongue cancer   Cardiomyopathy, nonischemic ([place]) 1998   CHF (congestive heart failure) (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 It sometimes shows as a shadow on scans.     Medications:  Medications Prior to Admission  Medication Sig Dispense Refill Last Dose   acetaminophen (TYLENOL) 500 MG tablet Take 1,000 mg by mouth daily as needed for moderate pain.    unk   carvedilol (COREG) 25 MG tablet TAKE 1 TABLET BY MOUTH TWO TIMES A DAY NEEDS APPOINTMENT  (Patient taking differently: Take 25 mg by mouth 2 (two) times daily with a meal.) 180 tablet 2 02/18/2021 at 0730   Cholecalciferol (VITAMIN D3) 50 MCG (2000 UT) TABS Take 1 tablet by mouth daily.   02/18/2021   diltiazem (CARDIZEM) 90 MG tablet TAKE ONE TABLET BY MOUTH DAILY (Patient taking differently: Take 90 mg by mouth at bedtime.) 90 tablet 3 02/17/2021   donepezil (ARICEPT) 10 MG tablet TAKE ONE TABLET BY MOUTH DAILY (Patient taking differently: Take 10 mg by mouth daily.) 90 tablet 0 02/18/2021   memantine (NAMENDA) 10 MG tablet Take 1 tablet every day for 2 weeks, then increase to 1 tablet twice a day and continue (Patient taking differently: Take 10 mg by mouth 2 (two) times daily.) 60 tablet 11 02/18/2021   PARoxetine (PAXIL) 20 MG tablet Take 1 tablet (20 mg total) by mouth daily. 90 tablet 3 02/18/2021   polyethylene glycol (MIRALAX) packet Take 17 g by mouth daily as needed. Available OTC (Patient taking differently: Take 17 g by mouth daily as needed for mild constipation.) 30  each 0 unk   potassium chloride (KLOR-CON) 10 MEQ tablet DISSOLVE 1 TABLET IN WATER AND ADMINISTER VIA PEG TUBE TAKE ONE TABLET BY MOUTH DAILY (Patient taking differently: Take 10 mEq by mouth at bedtime.) 90 tablet 3 02/17/2021   rosuvastatin (CRESTOR) 10 MG tablet Take 1 tablet (10 mg total) by mouth daily. 90 tablet 2 02/17/2021   torsemide (DEMADEX) 20 MG tablet TAKE ONE AND ONE-HALF TABLETS (30 MG) BY MOUTH DAILY, ALTERNATING WITH 1 TABLET (20 MG) EVERY THIRD DAY (Patient taking differently: Take 20-30 mg by mouth See admin instructions. Take 30mg  by mouth daily, alternating with 20mg  every third day.) 180 tablet 3 02/18/2021   warfarin (COUMADIN) 5 MG tablet TAKE ONE TABLET BY MOUTH DAILY OR AS INSTRUCTED BY COUMADIN CLINIC (Patient taking differently: Take 5 mg by mouth at bedtime.) 90 tablet 0 02/17/2021    Assessment: 80 yo M admitted with L facial swelling and dental pain.  Pt admitted for IV antibiotics.  Pt on  Coumadin PTA for PAF.  INR elevated.  Goal of Therapy:  INR 2-3 Monitor platelets by anticoagulation protocol: Yes   Plan:  No Coumadin today. Daily INR  [employer], Pharm.D., BCPS Clinical Pharmacist Clinical phone for 02/20/2021 from 7:30-3:00 is [phone].  **Pharmacist phone directory can be found on [email or web address] listed under [place].  02/20/2021 12:53 PM

## 2021-02-21 DIAGNOSIS — L03211 Cellulitis of face: Secondary | ICD-10-CM

## 2021-02-21 DIAGNOSIS — K047 Periapical abscess without sinus: Secondary | ICD-10-CM | POA: Diagnosis not present

## 2021-02-21 DIAGNOSIS — I480 Paroxysmal atrial fibrillation: Secondary | ICD-10-CM

## 2021-02-21 DIAGNOSIS — R220 Localized swelling, mass and lump, head: Secondary | ICD-10-CM | POA: Diagnosis not present

## 2021-02-21 LAB — GLUCOSE, CAPILLARY
Glucose-Capillary: 102 mg/dL — ABNORMAL HIGH (ref 70–99)
Glucose-Capillary: 124 mg/dL — ABNORMAL HIGH (ref 70–99)

## 2021-02-21 LAB — PROTIME-INR
INR: 2.5 — ABNORMAL HIGH (ref 0.8–1.2)
Prothrombin Time: 27 seconds — ABNORMAL HIGH (ref 11.4–15.2)

## 2021-02-21 LAB — MRSA PCR SCREENING: MRSA by PCR: NEGATIVE

## 2021-02-21 MED ORDER — WARFARIN SODIUM 5 MG PO TABS: ORAL_TABLET | ORAL | 0 refills | Status: DC

## 2021-02-21 MED ORDER — AMOXICILLIN-POT CLAVULANATE 875-125 MG PO TABS: 1.0000 | ORAL_TABLET | Freq: Two times a day (BID) | ORAL | 0 refills | Status: AC

## 2021-02-21 NOTE — Discharge Summary (Signed)
Physician Discharge Summary  Collin [name] ZOX:[id number] DOB: 1940/12/02 DOA: 02/18/2021  PCP: [name], MD  Admit date: 02/18/2021 Discharge date: 02/21/2021  Admitted From: Home Disposition:  home  Recommendations for Outpatient Follow-up:  Has maxillofacial dental appointment this upcoming week. Please obtain BMP/CBC in one week   Home Health:no Equipment/Devices:None  Discharge Condition:Stable CODE STATUS:Full Diet recommendation: Heart Healthy   Brief/Interim Summary: 80 y.o. male past medical history of paroxysmal atrial fibrillation on Coumadin, nonischemic cardiomyopathy status post AICD placement with an echo in 2018 that showed an EF of 50% tongue cancer status post radiation 2019 and dementia comes into: Hospital for facial swelling 4 days prior to admission he has been having increased facial swelling associated with redness.  He complains of dental pain.  CT scan showed poor dentition with multifocal odontogenic cellulitis in addition to 9 mm subperiosteal abscess overlying the anterior left maxilla alveolus.  He was started empirically on IV Unasyn and clindamycin in the ED    Discharge Diagnoses:  Principal Problem:   Facial swelling Active Problems:   PAF (paroxysmal atrial fibrillation) (HCC)   Type 2 diabetes mellitus without complication, without long-term current use of insulin (HCC)   Mixed hyperlipidemia   Chronic diastolic CHF (congestive heart failure) (HCC)   Odontogenic infection of jaw   Dental abscess  Facial swelling likely due to dental abscess: He has no criteria for sepsis sepsis has been ruled out. CT of the maxillofacial showed a 9 mm abscess, CT scan of the head and neck showed no acute inflammatory process and some residual symmetry. He was started empirically on IV antibiotics the swelling of the face improved significantly. He relates he was having no further pain with chewing. He has a follow-up appointment with the  maxillofacial surgeon on 02/25/2021. He was discharged on antibiotics.  Paroxysmal atrial fibrillation: INR persistently high despite holding Coumadin likely due to antibiotics. His rate is controlled. He will hold his Coumadin he sees his Investment banker, corporate.  Diabetes mellitus type 2 without complications: No changes made to his medication.  Hyperlipidemia:  Excellent continue statins.  Nonischemic cardiomyopathy: Continue current medication.  Mild dementia: Continue Aricept and Namenda wife is at bedside.  Discharge Instructions  Discharge Instructions     Diet - low sodium heart healthy   Complete by: As directed    Increase activity slowly   Complete by: As directed       Allergies as of 02/21/2021       Reactions   Niaspan [niacin Er] Other (See Comments)   FLUSHED        Medication List     TAKE these medications    acetaminophen 500 MG tablet Commonly known as: TYLENOL Take 1,000 mg by mouth daily as needed for moderate pain.   amoxicillin-clavulanate 875-125 MG tablet Commonly known as: Augmentin Take 1 tablet by mouth 2 (two) times daily for 14 days.   carvedilol 25 MG tablet Commonly known as: COREG TAKE 1 TABLET BY MOUTH TWO TIMES A DAY NEEDS APPOINTMENT What changed:  how much to take how to take this when to take this additional instructions   diltiazem 90 MG tablet Commonly known as: CARDIZEM TAKE ONE TABLET BY MOUTH DAILY What changed: when to take this   donepezil 10 MG tablet Commonly known as: ARICEPT TAKE ONE TABLET BY MOUTH DAILY   memantine 10 MG tablet Commonly known as: NAMENDA Take 1 tablet every day for 2 weeks, then increase to 1 tablet twice a  day and continue What changed:  how much to take how to take this when to take this additional instructions   PARoxetine 20 MG tablet Commonly known as: PAXIL Take 1 tablet (20 mg total) by mouth daily.   polyethylene glycol 17 g packet Commonly known as:  MiraLax Take 17 g by mouth daily as needed. Available OTC What changed:  reasons to take this additional instructions   potassium chloride 10 MEQ tablet Commonly known as: KLOR-CON DISSOLVE 1 TABLET IN WATER AND ADMINISTER VIA PEG TUBE TAKE ONE TABLET BY MOUTH DAILY What changed:  how much to take how to take this when to take this additional instructions   rosuvastatin 10 MG tablet Commonly known as: CRESTOR Take 1 tablet (10 mg total) by mouth daily.   torsemide 20 MG tablet Commonly known as: DEMADEX TAKE ONE AND ONE-HALF TABLETS (30 MG) BY MOUTH DAILY, ALTERNATING WITH 1 TABLET (20 MG) EVERY THIRD DAY What changed:  how much to take how to take this when to take this additional instructions   Vitamin D3 50 MCG (2000 UT) Tabs Take 1 tablet by mouth daily.   warfarin 5 MG tablet Commonly known as: COUMADIN Take as directed. If you are unsure how to take this medication, talk to your nurse or doctor. Original instructions: Take as instructed by the Coumadin clinic. Start taking on: February 24, 2021 What changed:  See the new instructions. These instructions start on February 24, 2021. If you are unsure what to do until then, ask your doctor or other care provider.        Allergies  Allergen Reactions   Niaspan [Niacin Er] Other (See Comments)    FLUSHED    Consultations: None   Procedures/Studies: CT Soft Tissue Neck W Contrast  Result Date: 02/19/2021 CLINICAL DATA:  80 year old male with history of tongue cancer. New left facial swelling, also tenderness in the right submandibular area. EXAM: CT NECK WITH CONTRAST TECHNIQUE: Multidetector CT imaging of the neck was performed using the standard protocol following the bolus administration of intravenous contrast. CONTRAST:  75mL OMNIPAQUE IOHEXOL 300 MG/ML  SOLN COMPARISON:  Face CT today reported separately. PET-CT 01/09/2019.  Prior neck CT 09/01/2017, 11/10/2016. FINDINGS: Pharynx and larynx: Mild generalized  pharyngeal mucosal space thickening compatible with prior XRT. Trace retained secretions in the oropharynx. Pharyngeal and laryngeal soft tissue contours are within normal limits. No discrete residual right base of tongue mass. Parapharyngeal and retropharyngeal spaces are within normal limits. Salivary glands: Residual asymmetric sublingual space soft tissue on series 5, image 57, although not significantly changed compared to 09/01/2017 and regressed from the initial neck CT. No significant sublingual mass effect. Post XRT atrophy of the bilateral submandibular glands. Parotid glands are better preserved and within normal limits. Thyroid: Negative. Lymph nodes: Matted soft tissue along the right level 2 and level 3 lymph node stations (series 1, image 51) which is regressed compared to 09/01/2017 and the site of the dominant malignant nodes on the presentation exam. Small bilateral level 1 nodes. No discrete lymphadenopathy today. Vascular: Major vascular structures in the neck and at the skull base are patent. Right IJ remains in place. Bilateral carotid bifurcation calcified atherosclerosis worse on the left. Codominant vertebral arteries. V4 segment and bulky left greater than right visible ICA siphon calcified plaque. Limited intracranial: Negative. Visualized orbits: Postoperative changes to both globes. Asymmetric left preseptal and left facial soft tissue swelling in conjunction with abnormal perioral soft tissue swelling. See maxillofacial CT reported separately. Mastoids  and visualized paranasal sinuses: See face CT reported separately. Skeleton: Carious dentition. See face CT reported separately. Skull base and cervical spine appear stable. Osteopenia. But no osseous metastatic disease identified. Congenital incomplete ossification of the posterior C1 ring. Upper chest: Partially visible left chest pacemaker device. Little of the superior mediastinum is included. Negative lung apices. IMPRESSION: 1.  Acute inflammatory perioral, left face, and left periorbital process is detailed on the Face CT reported separately. 2. Satisfactory post treatment appearance of the neck. Post XRT changes. Residual asymmetric soft tissue in the right sublingual space and right level 2/3 lymph node stations corresponding to sites of treated tumor in 2018. 3. Calcified carotid atherosclerosis. Electronically Signed   By: Odessa Fleming M.D.   On: 02/19/2021 04:16   CT Maxillofacial W Contrast  Result Date: 02/19/2021 CLINICAL DATA:  80 year old male with history of tongue cancer. New left facial swelling, also tenderness in the right submandibular area. EXAM: CT MAXILLOFACIAL WITH CONTRAST TECHNIQUE: Multidetector CT imaging of the maxillofacial structures was performed with intravenous contrast. Multiplanar CT image reconstructions were also generated. CONTRAST:  75mL OMNIPAQUE IOHEXOL 300 MG/ML SOLN in conjunction with contrast enhanced imaging of the neck reported separately. COMPARISON:  Neck CT reported separately. FINDINGS: Osseous: Extensively carious dentition. Mandible is intact and normally located. Carious anterior left mandible molar with periapical lucency and lateral mandible body dehiscence (series 4, image 25) which is in proximity to the left lateral facial soft tissue inflammation. Poor maxillary dentition also with periapical lucency at the left anterior maxillary bicuspid which is in proximity to the periorbital soft tissue inflammation. And along the anterior maxillary body overlying the carious left lateral incisor where additional periapical lucency is noted there is a small semicircular roughly 9 mm subperiosteal abscess on series 3, image 52. Additional generalized periorbital edema and soft tissue swelling which tracks up the left nasal labial fold. No other No acute osseous abnormality identified. Orbits: Intact orbital walls. Globes and intraorbital soft tissues appear intact. There is left side preseptal  soft tissue swelling and stranding. No soft tissue gas. No periorbital fluid collection. Sinuses: Opacified left maxillary sinus with mucoperiosteal thickening and poor left maxillary dentition. Contralateral mild to moderate right maxillary mucoperiosteal thickening. Subtotal opacification of the left anterior ethmoid air cells. Bubbly opacity in the left frontal sinus. Opacified left OMC. Small volume retained secretions in the left nasopharynx. Tympanic cavities and mastoids are clear. Soft tissues: Widespread soft tissue inflammation in the left face, perioral soft tissues eccentric to the left, and left preseptal space as detailed above. Other deep space face and neck soft tissue findings are reported on the neck CT today. Limited intracranial: ICA siphon and distal vertebral artery calcified atherosclerosis greater on the left. Visible brain parenchyma is within normal limits for age. IMPRESSION: 1. Poor dentition with multifocal odontogenic cellulitis suspected: - perioral cellulitis tracking up the left nasal labial fold with a 9 mm subperiosteal abscess overlying the anterior left maxillary alveolus, and likely associated with the left maxillary lateral incisor and/or anterior bicuspid. - left mandible molar periapical lucency and dehiscence at the lateral mandible body with regional left face inflammation. - preseptal left orbit involvement also, but no other complicating features. 2. Left OMC obstructive pattern of acute on chronic paranasal sinus disease, possibly also odontogenic originating from the left maxillary sinus. 3. Other Neck restaging is detailed on the Neck CT today. Electronically Signed   By: Odessa Fleming M.D.   On: 02/19/2021 04:26   DG Chest Portable  1 View  Result Date: 02/19/2021 CLINICAL DATA:  Shortness of breath EXAM: PORTABLE CHEST 1 VIEW COMPARISON:  08/08/2018 FINDINGS: Left AICD remains in place, unchanged. Cardiomegaly. No confluent opacities, effusions or edema. No acute bony  abnormality. IMPRESSION: Cardiomegaly.  No active disease. Electronically Signed   By: Charlett Nose M.D.   On: 02/19/2021 00:28    Subjective: No complaints feels great.  Discharge Exam: Vitals:   02/20/21 1953 02/21/21 0343  BP: 111/77 102/69  Pulse: 61 (!) 104  Resp: 17 18  Temp: 98 F (36.7 C) 97.8 F (36.6 C)  SpO2: 97% 98%   Vitals:   02/20/21 1121 02/20/21 1537 02/20/21 1953 02/21/21 0343  BP: 92/72 96/66 111/77 102/69  Pulse: 80 (!) 59 61 (!) 104  Resp: 16 18 17 18   Temp: 98.3 F (36.8 C) 98 F (36.7 C) 98 F (36.7 C) 97.8 F (36.6 C)  TempSrc: Oral Oral  Oral  SpO2: 98% 98% 97% 98%  Weight:      Height:        General: Pt is alert, awake, not in acute distress Cardiovascular: RRR, S1/S2 +, no rubs, no gallops Respiratory: CTA bilaterally, no wheezing, no rhonchi Abdominal: Soft, NT, ND, bowel sounds + Extremities: no edema, no cyanosis    The results of significant diagnostics from this hospitalization (including imaging, microbiology, ancillary and laboratory) are listed below for reference.     Microbiology: Recent Results (from the past 240 hour(s))  Blood culture (routine x 2)     Status: None (Preliminary result)   Collection Time: 02/19/21 12:14 AM   Specimen: BLOOD  Result Value Ref Range Status   Specimen Description BLOOD RIGHT ARM  Final   Special Requests   Final    BOTTLES DRAWN AEROBIC AND ANAEROBIC Blood Culture adequate volume   Culture   Final    NO GROWTH 1 DAY Performed at [facility], [address] [place], [place] [address]    Report Status PENDING  Incomplete  Blood culture (routine x 2)     Status: None (Preliminary result)   Collection Time: 02/19/21 12:48 AM   Specimen: BLOOD  Result Value Ref Range Status   Specimen Description BLOOD LEFT ARM  Final   Special Requests   Final    BOTTLES DRAWN AEROBIC AND ANAEROBIC Blood Culture adequate volume   Culture   Final    NO GROWTH 1 DAY Performed at [facility], [address], [place], [place] [address]    Report Status PENDING  Incomplete  SARS CORONAVIRUS 2 (TAT 6-24 HRS) Nasopharyngeal Nasopharyngeal Swab     Status: None   Collection Time: 02/19/21 10:25 AM   Specimen: Nasopharyngeal Swab  Result Value Ref Range Status   SARS Coronavirus 2 NEGATIVE NEGATIVE Final    Comment: (NOTE) SARS-CoV-2 target nucleic acids are NOT DETECTED.  The SARS-CoV-2 RNA is generally detectable in upper and lower respiratory specimens during the acute phase of infection. Negative results do not preclude SARS-CoV-2 infection, do not rule out co-infections with other pathogens, and should not be used as the sole basis for treatment or other patient management decisions. Negative results must be combined with clinical observations, patient history, and epidemiological information. The expected result is Negative.  Fact Sheet for Patients: HairSlick.no  Fact Sheet for Healthcare Providers: quierodirigir.com  This test is not yet approved or cleared by the [place] FDA and  has been authorized for detection and/or diagnosis of SARS-CoV-2 by  FDA under an Emergency Use Authorization (EUA). This EUA will remain  in effect (meaning this test can be used) for the duration of the COVID-19 declaration under Se ction 564(b)(1) of the Act, 21 U.S.C. section 360bbb-3(b)(1), unless the authorization is terminated or revoked sooner.  Performed at [facility], [address], [place], [place] [address]   MRSA PCR Screening     Status: None   Collection Time: 02/20/21 11:17 PM  Result Value Ref Range Status   MRSA by PCR NEGATIVE NEGATIVE Final    Comment:        The GeneXpert MRSA Assay (FDA approved for NASAL specimens only), is one component of a comprehensive MRSA colonization surveillance program. It is not intended to diagnose MRSA infection nor to guide or monitor treatment  for MRSA infections. Performed at [facility], [address], [place], [place] [address]      Labs: BNP (last 3 results) No results for input(s): BNP in the last 8760 hours. Basic Metabolic Panel: Recent Labs  Lab 02/19/21 0007 02/20/21 0330  NA 137 139  K 4.0 3.8  CL 103 102  CO2 26 27  GLUCOSE 130* 101*  BUN 15 16  CREATININE 0.87 0.94  CALCIUM 8.8* 8.9  MG  --  2.2   Liver Function Tests: Recent Labs  Lab 02/19/21 0007 02/20/21 0330  AST 15 13*  ALT 16 13  ALKPHOS 92 88  BILITOT 0.8 1.0  PROT 7.1 6.6  ALBUMIN 3.4* 3.1*   No results for input(s): LIPASE, AMYLASE in the last 168 hours. No results for input(s): AMMONIA in the last 168 hours. CBC: Recent Labs  Lab 02/19/21 0007 02/20/21 0330  WBC 6.4 5.1  NEUTROABS 5.2 4.3  HGB 13.2 12.4*  HCT 40.5 37.6*  MCV 93.8 93.1  PLT 180 187   Cardiac Enzymes: No results for input(s): CKTOTAL, CKMB, CKMBINDEX, TROPONINI in the last 168 hours. BNP: Invalid input(s): POCBNP CBG: Recent Labs  Lab 02/20/21 0629 02/20/21 1130 02/20/21 1534 02/20/21 2159 02/21/21 0612  GLUCAP 107* 113* 125* 86 102*   D-Dimer No results for input(s): DDIMER in the last 72 hours. Hgb A1c Recent Labs    02/19/21 0007  HGBA1C 6.0*   Lipid Profile No results for input(s): CHOL, HDL, LDLCALC, TRIG, CHOLHDL, LDLDIRECT in the last 72 hours. Thyroid function studies No results for input(s): TSH, T4TOTAL, T3FREE, THYROIDAB in the last 72 hours.  Invalid input(s): FREET3 Anemia work up No results for input(s): VITAMINB12, FOLATE, FERRITIN, TIBC, IRON, RETICCTPCT in the last 72 hours. Urinalysis    Component Value Date/Time   COLORURINE AMBER (A) 11/12/2017 2006   APPEARANCEUR HAZY (A) 11/12/2017 2006   LABSPEC 1.024 11/12/2017 2006   PHURINE 5.0 11/12/2017 2006   GLUCOSEU NEGATIVE 11/12/2017 2006   HGBUR NEGATIVE 11/12/2017 2006   BILIRUBINUR SMALL (A) 11/12/2017 2006   KETONESUR 5 (A) 11/12/2017 2006   PROTEINUR  100 (A) 11/12/2017 2006   UROBILINOGEN 0.2 08/20/2011 1711   NITRITE NEGATIVE 11/12/2017 2006   LEUKOCYTESUR NEGATIVE 11/12/2017 2006   Sepsis Labs Invalid input(s): PROCALCITONIN,  WBC,  LACTICIDVEN Microbiology Recent Results (from the past 240 hour(s))  Blood culture (routine x 2)     Status: None (Preliminary result)   Collection Time: 02/19/21 12:14 AM   Specimen: BLOOD  Result Value Ref Range Status   Specimen Description BLOOD RIGHT ARM  Final   Special Requests   Final    BOTTLES DRAWN AEROBIC AND ANAEROBIC  Blood Culture adequate volume   Culture   Final    NO GROWTH 1 DAY Performed at [facility], [address], [place], [place] [address]    Report Status PENDING  Incomplete  Blood culture (routine x 2)     Status: None (Preliminary result)   Collection Time: 02/19/21 12:48 AM   Specimen: BLOOD  Result Value Ref Range Status   Specimen Description BLOOD LEFT ARM  Final   Special Requests   Final    BOTTLES DRAWN AEROBIC AND ANAEROBIC Blood Culture adequate volume   Culture   Final    NO GROWTH 1 DAY Performed at [facility], [address] [place], [place] [address]    Report Status PENDING  Incomplete  SARS CORONAVIRUS 2 (TAT 6-24 HRS) Nasopharyngeal Nasopharyngeal Swab     Status: None   Collection Time: 02/19/21 10:25 AM   Specimen: Nasopharyngeal Swab  Result Value Ref Range Status   SARS Coronavirus 2 NEGATIVE NEGATIVE Final    Comment: (NOTE) SARS-CoV-2 target nucleic acids are NOT DETECTED.  The SARS-CoV-2 RNA is generally detectable in upper and lower respiratory specimens during the acute phase of infection. Negative results do not preclude SARS-CoV-2 infection, do not rule out co-infections with other pathogens, and should not be used as the sole basis for treatment or other patient management decisions. Negative results must be combined with clinical observations, patient history, and epidemiological information. The expected result  is Negative.  Fact Sheet for Patients: HairSlick.no  Fact Sheet for Healthcare Providers: quierodirigir.com  This test is not yet approved or cleared by the [place] FDA and  has been authorized for detection and/or diagnosis of SARS-CoV-2 by FDA under an Emergency Use Authorization (EUA). This EUA will remain  in effect (meaning this test can be used) for the duration of the COVID-19 declaration under Se ction 564(b)(1) of the Act, 21 U.S.C. section 360bbb-3(b)(1), unless the authorization is terminated or revoked sooner.  Performed at [facility], [address] [place], [place] [address]   MRSA PCR Screening     Status: None   Collection Time: 02/20/21 11:17 PM  Result Value Ref Range Status   MRSA by PCR NEGATIVE NEGATIVE Final    Comment:        The GeneXpert MRSA Assay (FDA approved for NASAL specimens only), is one component of a comprehensive MRSA colonization surveillance program. It is not intended to diagnose MRSA infection nor to guide or monitor treatment for MRSA infections. Performed at [facility], [address] [place], [place] [address]      Time coordinating discharge: Over 40 minutes  SIGNED:   Marinda Elk, MD  [facility] 02/21/2021, 8:33 AM Pager   If 7PM-7AM, please contact night-coverage [email or web address] Password [email or web address]

## 2021-02-21 NOTE — Progress Notes (Signed)
ANTICOAGULATION CONSULT NOTE - Follow-Up Consult  Pharmacy Consult for Warfarin Indication: atrial fibrillation  Patient Measurements: Height: 6\' 1"  (185.4 cm) Weight: 97.5 kg (215 lb) IBW/kg (Calculated) : 79.9  Vital Signs: Temp: 99.1 F (37.3 C) (06/18 0830) Temp Source: Oral (06/18 0830) BP: 106/69 (06/18 0830) Pulse Rate: 88 (06/18 0830)  Labs: Recent Labs    02/19/21 0007 02/19/21 0039 02/19/21 0209 02/20/21 0330 02/20/21 1053 02/21/21 0720  HGB 13.2  --   --  12.4*  --   --   HCT 40.5  --   --  37.6*  --   --   PLT 180  --   --  187  --   --   LABPROT  --  35.8*  --   --  37.1* 27.0*  INR  --  3.6*  --   --  3.8* 2.5*  CREATININE 0.87  --   --  0.94  --   --   TROPONINIHS 21*  --  22*  --   --   --      Estimated Creatinine Clearance: 77 mL/min (by C-G formula based on SCr of 0.94 mg/dL).  Assessment: 80 yo M admitted with L facial swelling and dental pain.  Pt admitted for IV antibiotics.  Pt on Coumadin PTA for PAF.    INR today is back down to 2.5 however per MD note plan is to hold warfarin until the patient can be seen by the oral surgeon.   Goal of Therapy:  INR 2-3 Monitor platelets by anticoagulation protocol: Yes   Plan:  Continue to hold warfarin per MD plan Planning discharge today  Thank you for allowing pharmacy to be a part of this patient's care.  Alycia Rossetti, PharmD, BCPS Clinical Pharmacist 02/21/2021 10:40 AM   **Pharmacist phone directory can now be found on [email or web address] ([email or web address]).  Listed under [place].

## 2021-02-23 ENCOUNTER — Ambulatory Visit (INDEPENDENT_AMBULATORY_CARE_PROVIDER_SITE_OTHER): Payer: Medicare HMO | Admitting: General Practice

## 2021-02-23 ENCOUNTER — Other Ambulatory Visit: Payer: Self-pay

## 2021-02-23 ENCOUNTER — Telehealth: Payer: Self-pay | Admitting: Neurology

## 2021-02-23 ENCOUNTER — Telehealth: Payer: Self-pay | Admitting: Family Medicine

## 2021-02-23 DIAGNOSIS — Z7901 Long term (current) use of anticoagulants: Secondary | ICD-10-CM

## 2021-02-23 DIAGNOSIS — I480 Paroxysmal atrial fibrillation: Secondary | ICD-10-CM | POA: Diagnosis not present

## 2021-02-23 LAB — POCT INR: INR: 1.6 — AB (ref 2.0–3.0)

## 2021-02-23 NOTE — Telephone Encounter (Signed)
Patient was just in the hospital and needs a note to go back to the [facility].  His spouse said to call her to come pick up the note.

## 2021-02-23 NOTE — Telephone Encounter (Signed)
Patient's wife called in stating the patient has been taking Memantine twice a day, but he just got out of the hospital and they wanted him to go back to the original of 1 a day for 2 weeks. She is just not sure if she is confused for if they were confused.

## 2021-02-23 NOTE — Telephone Encounter (Signed)
Ok to go back down to 1 tablet daily of Memantine and see if this helps. Thanks

## 2021-02-23 NOTE — Patient Instructions (Signed)
Pre visit review using our clinic review tool, if applicable. No additional management support is needed unless otherwise documented below in the visit note.  Take 1 1/2 tablets today and tomorrow and then continue to take 1 tablet daily except 1/2 tablet on Wednesdays.  Re-check after dental surgery.  Call [place], RN @ [phone] when you get a date for surgery.

## 2021-02-23 NOTE — Telephone Encounter (Signed)
Patient is advised.  

## 2021-02-23 NOTE — Telephone Encounter (Signed)
Please advise 

## 2021-02-23 NOTE — Telephone Encounter (Signed)
Patient was admitted to the hospital 02/18/2021. Hospital follow up need to authorize this letter?

## 2021-02-24 ENCOUNTER — Telehealth: Payer: Self-pay

## 2021-02-24 LAB — CULTURE, BLOOD (ROUTINE X 2)
Culture: NO GROWTH
Culture: NO GROWTH
Special Requests: ADEQUATE
Special Requests: ADEQUATE

## 2021-02-24 NOTE — Telephone Encounter (Signed)
Transition Care Management Unsuccessful Follow-up Telephone Call  Date of discharge and from where:  Collin [name] 02/21/2021  Attempts:  1st Attempt  Reason for unsuccessful TCM follow-up call:  No answer/busy

## 2021-02-24 NOTE — Telephone Encounter (Signed)
Spoke with the patients wife. A hospital follow up appointment has been made.

## 2021-02-25 NOTE — Progress Notes (Addendum)
Assessment/Plan:    Moderate Dementia with Behavioral Disturbance Collin [name] is an 80 year old right-handed male with a history of moderate dementia with behavioral disturbance, seen today in follow-up. MMSE today is 17/30 slightly better from 15/30 one year prior. His mood appears to have improved since attending [facility], and Paxil has been increased to 20 mg daily.  He had a recent hospitalization for left facial cellulitis treated with IV antibiotics, and then discharged on oral antibiotics, with good response.   Recommendations as follows:   Continue donepezil 10 mg daily . Side effects were discussed  Continue Memantine 10 mg twice daily. Side effects were discussed  Continue attending the [facility] Discussed safety both in and out of the home.  Continue to monitor mood, and continue Paroxetine 20g daily  Follow up in 6 months.   Case discussed with Dr. Delice Lesch who agrees with the plan     Subjective:     Collin [name] is a 80 y.o. 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 This patient is accompanied in the office by his wife Clarene Critchley who supplements the history.  Previous records as well as any outside records available were reviewed prior to todays visit.  Patient is currently on Donepezil 10 mg daily and Memantine 10 mg daily.  The patient after his hospital visit, he is doing well.  He reports that today is a "super day ", although the "memory is not much of a memory ".  He feels that attending the adult day program at [facility], has helped with his overall behavior, especially with his mood.  He goes there 5 times a week, Monday through Friday from 9-5.  There, reports that he is building  his favorite crafts (making canes and pool poles), doing chair exercises, and specifically, talking to other adults.  He states that while there, he is free to interact, and feels very much like himself.  His wife adds that he likes being the center of attention, and always has been, and "he shines there ".  However, when he gets home, he is "grouchy again, but only at home.  There, he only likes to talk to the cat ".  After increasing his mood stabilizer to paroxetine 20 mg daily, he finds that he can sleep better, and be less irritable.  When he goes to sleep, "he turns on the switch ", falling asleep quickly, and staying asleep.  Only a couple of times he has reported acting out, but does not sleep walk.  He always had vivid dreams, but he does not report any nightmares.  "In my dreams I go to places, I do think so I wish I had a recorder for.  I thought in my sleep and lash out a couple of times ".  He tries to remain as independent as he can with bathing and dressing.  He is no longer in charge of medications, finances or driving.  He denies leaving objects in unusual places.  His appetite is good, but at [facility] is "great, because they serve [employer] so they help great breakfast and lunch ".  He has chronic dysphagia with mild aspiration risk for swallowing study in November 2021, but at the facility they  he is feeling very small pieces and he is able to swallow without difficulty.  He does not cook.  Of note,  he had a hospital visit for left facial cellulitis, and he was given antibiotics which have helped the swelling.  He has to undergo some dental extraction soon, due to poor dentition. He ambulates with a cane that he has built and he is very proud to show.  He has chronic swelling in his lower extremities, right greater than left, so he is cautious when ambulating.  He denies any headaches, trauma or injuries recently, he denies any double vision, but he has difficulty focusing, because he  does not like to wear glasses.  His hearing is better after he "decided to wear his hearing aids"-wife says.  He denies any focal numbness or tingling, unilateral weakness or tremors.  He denies urine incontinence or retention.  Denies constipation or diarrhea.  He has an appointment with his cardiologist soon, to manage his blood pressure medications, which have been running on the lower side.      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 He started noticing changes around a year ago, soon after diagnosis of cancer. He describes his memory as "terrible," he would go upstairs and stop in the hallway forgetting what he went for. He could not find his eyeglasses and got very frustrated, berating himself for 40 minutes, calling himself stupid. His wife reports minor memory issues for a few years, but more noticeable in the past year. He used to manage his own medications, but at that time she would find full bottles of medications that should have been empty. She took over medications and now fixes his pills weekly. He states he does not drive as much and denies getting lost driving, his wife corrects him and reminds him he stopped driving November 6295 when he was having episodes with his ICD and was told not to drive. She feels he would have gotten lost, she always gives him instructions driving and he would panic if he did not know where he was. The other day at home he could not find their drinking glasses and was opening all the cabinets in the kitchen. He repeats stories and questions. He is independent with dressing and bathing, but his wife is never sure if he brushes his teeth, he gets mad if she  reminds him. She states "he is a good actor." She reports he is friendly to everyone, talking to strangers, but once they get into the car, "he is a grump, angry old man," taking it out on her all the time. She reports since the cancer diagnosis, things have been a lot worse, she thinks he is depressed. He states he is frustrated because he cannot do a lot of things he used to do.   He gets dizzy a lot when standing and has to get his bearings first. He has missteps and falls, he fell yesterday and had abrasions on his left arm. His vision is blurred. He has had swallowing difficulties since the surgery and radiation. He has a feeding tube but mostly is able to swallow by mouth now. No neck pain, some back discomfort. He gets left arm numbness when  sitting for a long time. He has swelling on his left ankle, his wife reports it is "collapsed" for years, but he has never been evaluated for it. He has constipation, no incontinence, no anosmia or tremors. His father had memory issues. No history of significant head injuries or alcohol use. He finished up to the 9th grade.   Diagnostic Data:  I personally reviewed head CT with and without contrast done July 2019 which did not show any acute changes, there was mild diffuse atrophy. TSH and B12 normal.    PREVIOUS MEDICATIONS:   CURRENT MEDICATIONS:  Outpatient Encounter Medications as of 02/26/2021  Medication Sig   acetaminophen (TYLENOL) 500 MG tablet Take 1,000 mg by mouth daily as needed for moderate pain.    amoxicillin-clavulanate (AUGMENTIN) 875-125 MG tablet Take 1 tablet by mouth 2 (two) times daily for 14 days.   carvedilol (COREG) 25 MG tablet TAKE 1 TABLET BY MOUTH TWO TIMES A DAY NEEDS APPOINTMENT   Cholecalciferol (VITAMIN D3) 50 MCG (2000 UT) TABS Take 1 tablet by mouth daily.   diltiazem (CARDIZEM) 90 MG tablet TAKE ONE TABLET BY MOUTH DAILY   donepezil (ARICEPT) 10 MG tablet TAKE ONE TABLET BY MOUTH DAILY   escitalopram (LEXAPRO) 20 MG  tablet Take 20 mg by mouth daily.   memantine (NAMENDA) 10 MG tablet Take 1 tablet every day for 2 weeks, then increase to 1 tablet twice a day and continue   PARoxetine (PAXIL) 20 MG tablet Take 1 tablet (20 mg total) by mouth daily.   polyethylene glycol (MIRALAX) packet Take 17 g by mouth daily as needed. Available OTC   potassium chloride (KLOR-CON) 10 MEQ tablet DISSOLVE 1 TABLET IN WATER AND ADMINISTER VIA PEG TUBE TAKE ONE TABLET BY MOUTH DAILY   rosuvastatin (CRESTOR) 10 MG tablet Take 1 tablet (10 mg total) by mouth daily.   torsemide (DEMADEX) 20 MG tablet TAKE ONE AND ONE-HALF TABLETS (30 MG) BY MOUTH DAILY, ALTERNATING WITH 1 TABLET (20 MG) EVERY THIRD DAY   warfarin (COUMADIN) 5 MG tablet Take as instructed by the Coumadin clinic.   No facility-administered encounter medications on file as of 02/26/2021.     Objective:     PHYSICAL EXAMINATION:    VITALS:   Vitals:   02/26/21 1413  BP: (!) 85/60  Pulse: 61  SpO2: 97%  Weight: 212 lb 12.8 oz (96.5 kg)  Height: 6\' 1"  (1.854 m)    GEN:  The patient appears stated age and is in NAD. HEENT:  Normocephalic, atraumatic.   Neurological examination:  General: NAD, well-groomed, appears stated age. Orientation: The patient is alert. Oriented to person, [place], [place], not the office (physical place), knows the month June  Cranial nerves: There is good facial symmetry.The speech is fluent and clear. No aphasia or dysarthria. Fund of knowledge is reduced . Recent and remote memory are impaired. Attention and concentration are reduced.  Able to name objects and repeat phrases.  Hearing is intact to conversational tone.    Sensation: Sensation is intact to light touch throughout Motor: Strength is at least antigravity x4.    Montreal Cognitive Assessment  04/04/2019  Visuospatial/ Executive (0/5) 1  Naming (0/3) 3  Attention: Read list of digits (0/2) 2  Attention: Read list of letters (0/1) 1  Attention: Serial 7  subtraction starting at 100 (0/3) 0  Language: Repeat phrase (0/2) 1  Language : Fluency (0/1) 0  Abstraction (0/2) 1  Delayed Recall (0/5) 4  Orientation (0/6) 1  Total 14  Adjusted Score (based on education) 15     MMSE - Mini Mental State Exam 02/26/2021 02/20/2020 09/22/2018  Orientation to time 2 1 2   Orientation to Place 2 2 4   Registration 3 3 3   Attention/ Calculation 0 0 0  Recall 2 1 2   Language- name 2 objects 2 2 2   Language- repeat 1 1 1   Language- follow 3 step command 3 2 3   Language- read & follow direction 1 1 1   Write a sentence 0 1 1  Copy design 1 1 1   Total score 17 15 20       Movement examination: Tone: There is normal tone in the UE/LE Abnormal movements:  no tremor.  No myoclonus.  No asterixis.   Coordination:  There is no decremation with RAM's. Normal finger to nose  Gait and Station: The patient has  difficulty arising out of a deep-seated chair without the use of the hands. He needs the cane, The patient's stride length is fair, Gait is cautious and narrow.    CBC CBC Latest Ref Rng & Units 02/20/2021 02/19/2021 10/10/2020  WBC 4.0 - 10.5 K/uL 5.1 6.4 4.2  Hemoglobin 13.0 - 17.0 g/dL 12.4(L) 13.2 14.4  Hematocrit 39.0 - 52.0 % 37.6(L) 40.5 43.1  Platelets 150 - 400 K/uL 187 180 219     CMP Latest Ref Rng & Units 02/20/2021 02/19/2021 10/10/2020  Glucose 70 - 99 mg/dL 101(H) 130(H) 101(H)  BUN 8 - 23 mg/dL 16 15 24   Creatinine 0.61 - 1.24 mg/dL 0.94 0.87 1.23  Sodium 135 - 145 mmol/L 139 137 146(H)  Potassium 3.5 - 5.1 mmol/L 3.8 4.0 4.5  Chloride 98 - 111 mmol/L 102 103 100  CO2 22 - 32 mmol/L 27 26 30(H)  Calcium 8.9 - 10.3 mg/dL 8.9 8.8(L) 9.5  Total Protein 6.5 - 8.1 g/dL 6.6 7.1 -  Total Bilirubin 0.3 - 1.2 mg/dL 1.0 0.8 -  Alkaline Phos 38 - 126 U/L 88 92 -  AST 15 - 41 U/L 13(L) 15 -  ALT 0 - 44 U/L 13 16 -       Total time spent on today's visit was 60 minutes, including both face-to-face time and nonface-to-face time. Time included  that spent on review of records (prior notes available to me/labs/imaging if pertinent), discussing treatment and goals, answering patient's questions and coordinating care.  Cc:  Eulas Post, MD Sharene Butters, PA-C

## 2021-02-26 ENCOUNTER — Encounter: Payer: Self-pay | Admitting: Physician Assistant

## 2021-02-26 ENCOUNTER — Other Ambulatory Visit: Payer: Self-pay

## 2021-02-26 ENCOUNTER — Ambulatory Visit: Payer: Medicare HMO | Admitting: Physician Assistant

## 2021-02-26 VITALS — BP 85/60 | HR 61 | Ht 73.0 in | Wt 212.8 lb

## 2021-02-26 DIAGNOSIS — F0391 Unspecified dementia with behavioral disturbance: Secondary | ICD-10-CM

## 2021-02-26 DIAGNOSIS — F03B18 Unspecified dementia, moderate, with other behavioral disturbance: Secondary | ICD-10-CM

## 2021-02-26 DIAGNOSIS — R69 Illness, unspecified: Secondary | ICD-10-CM | POA: Diagnosis not present

## 2021-02-26 NOTE — Patient Instructions (Signed)
It was a pleasure to see you today at our office.   Recommendations:  Follow up in  6 months Continue donepezil 10 mg daily   Continue Memantine 10 mg twice daily  Continue taking Paroxetine 20 mg daily   RECOMMENDATIONS FOR ALL PATIENTS WITH MEMORY PROBLEMS: 1. Continue to exercise (Recommend 30 minutes of walking everyday, or 3 hours every week) 2. Increase social interactions - continue going to [place] and enjoy social gatherings with friends and family 3. Eat healthy, avoid fried foods and eat more fruits and vegetables 4. Maintain adequate blood pressure, blood sugar, and blood cholesterol level. Reducing the risk of stroke and cardiovascular disease also helps promoting better memory. 5. Avoid stressful situations. Live a simple life and avoid aggravations. Organize your time and prepare for the next day in anticipation. 6. Sleep well, avoid any interruptions of sleep and avoid any distractions in the bedroom that may interfere with adequate sleep quality 7. Avoid sugar, avoid sweets as there is a strong link between excessive sugar intake, diabetes, and cognitive impairment We discussed the Mediterranean diet, which has been shown to help patients reduce the risk of progressive memory disorders and reduces cardiovascular risk. This includes eating fish, eat fruits and green leafy vegetables, nuts like almonds and hazelnuts, walnuts, and also use olive oil. Avoid fast foods and fried foods as much as possible. Avoid sweets and sugar as sugar use has been linked to worsening of memory function.  There is always a concern of gradual progression of memory problems. If this is the case, then we may need to adjust level of care according to patient needs. Support, both to the patient and caregiver, should then be put into place.    FALL PRECAUTIONS: Be cautious when walking. Scan the area for obstacles that may increase the risk of trips and falls. When getting up in the mornings, sit up at  the edge of the bed for a few minutes before getting out of bed. Consider elevating the bed at the head end to avoid drop of blood pressure when getting up. Walk always in a well-lit room (use night lights in the walls). Avoid area rugs or power cords from appliances in the middle of the walkways. Use a walker or a cane if necessary and consider physical therapy for balance exercise. Get your eyesight checked regularly.  HOME SAFETY: Consider the safety of the kitchen when operating appliances like stoves, microwave oven, and blender. Consider having supervision and share cooking responsibilities until no longer able to participate in those. Accidents with firearms and other hazards in the house should be identified and addressed as well.   ABILITY TO BE LEFT ALONE: If patient is unable to contact 911 operator, consider using LifeLine, or when the need is there, arrange for someone to stay with patients. Smoking is a fire hazard, consider supervision or cessation. Risk of wandering should be assessed by caregiver and if detected at any point, supervision and safe proof recommendations should be instituted.  MEDICATION SUPERVISION: Inability to self-administer medication needs to be constantly addressed. Implement a mechanism to ensure safe administration of the medications.    Mediterranean Diet A Mediterranean diet refers to food and lifestyle choices that are based on the traditions of countries located on the [employer]. This way of eating has been shown to help prevent certain conditions and improve outcomes for people who have chronic diseases, like kidney disease and heart disease. What are tips for following this plan? Lifestyle  Collin [name]  and eat meals together with your family, when possible. Drink enough fluid to keep your urine clear or pale yellow. Be physically active every day. This includes: Aerobic exercise like running or swimming. Leisure activities like gardening, walking, or  housework. Get 7-8 hours of sleep each night. If recommended by your health care provider, drink red wine in moderation. This means 1 glass a day for nonpregnant women and 2 glasses a day for men. A glass of wine equals 5 oz (150 mL). Reading food labels  Check the serving size of packaged foods. For foods such as rice and pasta, the serving size refers to the amount of cooked product, not dry. Check the total fat in packaged foods. Avoid foods that have saturated fat or trans fats. Check the ingredients list for added sugars, such as corn syrup. Shopping  At the grocery store, buy most of your food from the areas near the walls of the store. This includes: Fresh fruits and vegetables (produce). Grains, beans, nuts, and seeds. Some of these may be available in unpackaged forms or large amounts (in bulk). Fresh seafood. Poultry and eggs. Low-fat dairy products. Buy whole ingredients instead of prepackaged foods. Buy fresh fruits and vegetables in-season from local farmers markets. Buy frozen fruits and vegetables in resealable bags. If you do not have access to quality fresh seafood, buy precooked frozen shrimp or canned fish, such as tuna, salmon, or sardines. Buy small amounts of raw or cooked vegetables, salads, or olives from the deli or salad bar at your store. Stock your pantry so you always have certain foods on hand, such as olive oil, canned tuna, canned tomatoes, rice, pasta, and beans. Cooking  Cook foods with extra-virgin olive oil instead of using butter or other vegetable oils. Have meat as a side dish, and have vegetables or grains as your main dish. This means having meat in small portions or adding small amounts of meat to foods like pasta or stew. Use beans or vegetables instead of meat in common dishes like chili or lasagna. Experiment with different cooking methods. Try roasting or broiling vegetables instead of steaming or sauteing them. Add frozen vegetables to soups,  stews, pasta, or rice. Add nuts or seeds for added healthy fat at each meal. You can add these to yogurt, salads, or vegetable dishes. Marinate fish or vegetables using olive oil, lemon juice, garlic, and fresh herbs. Meal planning  Plan to eat 1 vegetarian meal one day each week. Try to work up to 2 vegetarian meals, if possible. Eat seafood 2 or more times a week. Have healthy snacks readily available, such as: Vegetable sticks with hummus. Greek yogurt. Fruit and nut trail mix. Eat balanced meals throughout the week. This includes: Fruit: 2-3 servings a day Vegetables: 4-5 servings a day Low-fat dairy: 2 servings a day Fish, poultry, or lean meat: 1 serving a day Beans and legumes: 2 or more servings a week Nuts and seeds: 1-2 servings a day Whole grains: 6-8 servings a day Extra-virgin olive oil: 3-4 servings a day Limit red meat and sweets to only a few servings a month What are my food choices? Mediterranean diet Recommended Grains: Whole-grain pasta. Brown rice. Bulgar wheat. Polenta. Couscous. Whole-wheat bread. [place]. Vegetables: Artichokes. Beets. Broccoli. Cabbage. Carrots. Eggplant. Green beans. Chard. Kale. Spinach. Onions. Leeks. Peas. Squash. Tomatoes. Peppers. Radishes. Fruits: Apples. Apricots. Avocado. Berries. Bananas. Cherries. Dates. Figs. Grapes. Lemons. Melon. Oranges. Peaches. Plums. Pomegranate. Meats and other protein foods: Beans. Almonds. Sunflower seeds. [employer]  nuts. Peanuts. [place]. Salmon. Scallops. Shrimp. [place]. Tilapia. Clams. Oysters. Eggs. Dairy: Low-fat milk. Cheese. Greek yogurt. Beverages: Water. Red wine. Herbal tea. Fats and oils: Extra virgin olive oil. Avocado oil. Grape seed oil. Sweets and desserts: [place] yogurt with honey. Baked apples. Poached pears. Trail mix. Seasoning and other foods: Basil. Cilantro. Coriander. Cumin. Mint. Parsley. Sage. Rosemary. Tarragon. Garlic. Oregano. Thyme. Pepper. Balsalmic vinegar. Tahini. Hummus. Tomato  sauce. Olives. Mushrooms. Limit these Grains: Prepackaged pasta or rice dishes. Prepackaged cereal with added sugar. Vegetables: Deep fried potatoes (french fries). Fruits: Fruit canned in syrup. Meats and other protein foods: Beef. Pork. Lamb. Poultry with skin. Hot dogs. [place] Salines. Dairy: Ice cream. Sour cream. Whole milk. Beverages: Juice. Sugar-sweetened soft drinks. Beer. Liquor and spirits. Fats and oils: Butter. Canola oil. Vegetable oil. Beef fat (tallow). Lard. Sweets and desserts: Cookies. Cakes. Pies. Candy. Seasoning and other foods: Mayonnaise. Premade sauces and marinades. The items listed may not be a complete list. Talk with your dietitian about what dietary choices are right for you. Summary The Mediterranean diet includes both food and lifestyle choices. Eat a variety of fresh fruits and vegetables, beans, nuts, seeds, and whole grains. Limit the amount of red meat and sweets that you eat. Talk with your health care provider about whether it is safe for you to drink red wine in moderation. This means 1 glass a day for nonpregnant women and 2 glasses a day for men. A glass of wine equals 5 oz (150 mL). This information is not intended to replace advice given to you by your health care provider. Make sure you discuss any questions you have with your health care provider. Document Released: 04/15/2016 Document Revised: 05/18/2016 Document Reviewed: 04/15/2016 Elsevier Interactive Patient Education  2017 [employer].

## 2021-03-02 ENCOUNTER — Other Ambulatory Visit: Payer: Self-pay

## 2021-03-02 ENCOUNTER — Ambulatory Visit (INDEPENDENT_AMBULATORY_CARE_PROVIDER_SITE_OTHER): Payer: Medicare HMO | Admitting: Family Medicine

## 2021-03-02 VITALS — BP 124/62 | HR 64 | Temp 97.6°F | Wt 209.9 lb

## 2021-03-02 DIAGNOSIS — I4891 Unspecified atrial fibrillation: Secondary | ICD-10-CM

## 2021-03-02 DIAGNOSIS — R220 Localized swelling, mass and lump, head: Secondary | ICD-10-CM

## 2021-03-02 DIAGNOSIS — L03115 Cellulitis of right lower limb: Secondary | ICD-10-CM | POA: Diagnosis not present

## 2021-03-02 MED ORDER — DOXYCYCLINE HYCLATE 100 MG PO CAPS: 100.0000 mg | ORAL_CAPSULE | Freq: Two times a day (BID) | ORAL | 0 refills | Status: DC

## 2021-03-02 NOTE — Progress Notes (Signed)
Established Patient Office Visit  Subjective:  Patient ID: Collin [name], male    DOB: 09/09/1940  Age: 80 y.o. MRN: [id number]  CC:  Chief Complaint  Patient presents with   Hospitalization Follow-up    Pt's L foot is also very swollen, x 3 days, very painful    HPI Collin [name] presents for recent hospitalization for facial cellulitis.  He has history of dementia and apparently developed some left facial swelling about 4 days prior to admission with increased redness.  Complaining of increased dental pain.  CT showed poor dentition with multifocal odontogenic cellulitis and also 9 mm subperiosteal abscess overlying the anterior left maxillary alveolus.  He was treated initially with IV Unasyn and clindamycin.  Facial swelling improved promptly.  No evidence for sepsis.  He was discharged on Augmentin and remains on that at this time.  He has history of paroxysmal atrial fibrillation is on chronic Coumadin.  His INR did drift down to 1.6 with last check.  He had A1c of 6.0%.  No history of peripheral vascular disease.  Since discharge she has had about 3 days of right foot swelling and redness and warmth.  No history of gout.  No known injury.  Denies any systemic fever or chills.  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 It sometimes  shows as a shadow on scans.     Past Surgical History:  Procedure Laterality Date   CARDIAC DEFIBRILLATOR PLACEMENT  2006   CATARACT EXTRACTION Bilateral    01/07/2016. 01/22/2016   CYST EXCISION     multiple drainage to a cyst in his neck.    DIRECT LARYNGOSCOPY N/A 11/16/2016   Procedure: DIRECT LARYNGOSCOPY AND BIOPSY;  Surgeon: [name], MD;  Location: [facility] OR;  Service: ENT;  Laterality: N/A;   EYE SURGERY     HERNIA REPAIR  1950s   IMPLANTABLE CARDIOVERTER DEFIBRILLATOR (ICD) GENERATOR CHANGE N/A 05/10/2014   Procedure: ICD GENERATOR CHANGE;  Surgeon: [name], MD;  Location: [facility] CATH LAB;  Service: Cardiovascular;  Laterality: N/A;   INSERTION OF MESH N/A 01/18/2018   Procedure: INSERTION OF MESH;  Surgeon: [name], MD;  Location: [facility] OR;  Service: General;  Laterality: N/A;   IR GASTROSTOMY TUBE MOD SED  12/27/2016   IR GASTROSTOMY TUBE REMOVAL  03/24/2018   IR PATIENT EVAL TECH 0-60 MINS  01/17/2017   MASS EXCISION Right 11/16/2016   Procedure: EXCISION RIGHT NECK LIPOMA;  Surgeon: [name], MD;  Location: [facility] OR;  Service: ENT;  Laterality: Right;   MOHS SURGERY  01/01/2015   to top of head   MOUTH SURGERY     teeth removed  4 teeth extracted 02/2010   RADICAL NECK DISSECTION Right 07/25/2017   Procedure: RIGHT NECK DISSECTION;  Surgeon: [name], MD;  Location: [facility] OR;  Service: ENT;  Laterality: Right;   UMBILICAL HERNIA REPAIR  01/18/2018   w/mesh   UMBILICAL HERNIA REPAIR N/A 01/18/2018   Procedure: UMBILICAL HERNIA REPAIR WITH MESH;  Surgeon: [name], MD;  Location: [facility] OR;  Service: General;  Laterality: N/A;    Family History  Problem Relation Age of Onset   Heart disease Mother    Sudden death Mother        under age 23   Lupus Mother    Hypertension Father    Stroke Father     Social History   Socioeconomic History   Marital status: Married    Spouse name: [name]   Number of children: 3   Years of education: Not  on file   Highest education level: Not on file  Occupational History   Occupation: Retired Engineer, materials  Tobacco Use   Smoking status: Never   Smokeless tobacco: Never  Vaping Use   Vaping Use: Never used  Substance and Sexual Activity   Alcohol use: Yes    Comment: "a little wine every once in a while" 01/17/2018   Drug use: No   Sexual activity: Not on file  Other Topics Concern   Not on file  Social History Narrative   Pt lives in split level home with his wife, Aggie Cosier.   Has 3 sons   9th grade education   Retired Electrical engineer   Right handed   Social Determinants of Health   Financial Resource Strain: Not on file  Food Insecurity: Not on file  Transportation Needs: Not on file  Physical Activity: Not on file  Stress: Not on file  Social Connections: Not on file  Intimate Partner Violence: Not on file    Outpatient Medications Prior to Visit  Medication Sig Dispense Refill   acetaminophen (TYLENOL) 500 MG tablet Take 1,000 mg by mouth daily as needed for moderate pain.      amoxicillin-clavulanate (AUGMENTIN) 875-125 MG tablet Take 1 tablet by mouth 2 (two) times daily for 14 days. 28 tablet 0   carvedilol (COREG) 25 MG tablet TAKE 1 TABLET BY MOUTH TWO TIMES A DAY NEEDS APPOINTMENT 180 tablet 2   Cholecalciferol (VITAMIN D3) 50 MCG (2000 UT) TABS Take 1 tablet by mouth daily.     diltiazem (CARDIZEM) 90 MG tablet TAKE ONE TABLET BY MOUTH DAILY 90 tablet 3   donepezil (ARICEPT) 10 MG tablet TAKE ONE TABLET BY MOUTH DAILY 90 tablet 0   escitalopram (LEXAPRO) 20 MG tablet Take 20 mg by mouth daily.     memantine (NAMENDA) 10 MG tablet Take 1 tablet every day for 2 weeks, then increase to 1 tablet twice a day and continue 60 tablet 11   PARoxetine (PAXIL) 20 MG tablet Take 1 tablet (20 mg total) by mouth daily. 90 tablet 3   polyethylene glycol (MIRALAX) packet Take 17 g by mouth daily as needed. Available OTC 30 each 0   potassium chloride (KLOR-CON) 10 MEQ tablet  DISSOLVE 1 TABLET IN WATER AND ADMINISTER VIA PEG TUBE TAKE ONE TABLET BY MOUTH DAILY 90 tablet 3   rosuvastatin (CRESTOR) 10 MG tablet Take 1 tablet (10 mg total) by mouth daily. 90 tablet 2   torsemide (DEMADEX) 20 MG tablet TAKE ONE AND ONE-HALF TABLETS (30 MG) BY MOUTH DAILY, ALTERNATING WITH 1 TABLET (  20 MG) EVERY THIRD DAY 180 tablet 3   warfarin (COUMADIN) 5 MG tablet Take as instructed by the Coumadin clinic. 90 tablet 0   No facility-administered medications prior to visit.    Allergies  Allergen Reactions   Niaspan [Niacin Er] Other (See Comments)    FLUSHED    ROS Review of Systems  Constitutional:  Negative for chills and fever.     Objective:    Physical Exam Vitals reviewed.  Cardiovascular:     Rate and Rhythm: Normal rate.  Pulmonary:     Effort: Pulmonary effort is normal.     Breath sounds: Normal breath sounds.  Skin:    Comments: Diffuse swelling of the right foot dorsally with some increased warmth and diffuse tenderness to palpation.  No obvious breaks in the skin noted.  Excellent capillary refill.  Neurological:     Mental Status: He is alert.    BP 124/62 (BP Location: Left Arm, Patient Position: Sitting, Cuff Size: Normal)   Pulse 64   Temp 97.6 F (36.4 C) (Oral)   Wt 209 lb 14.4 oz (95.2 kg)   SpO2 95%   BMI 27.69 kg/m  Wt Readings from Last 3 Encounters:  03/02/21 209 lb 14.4 oz (95.2 kg)  02/26/21 212 lb 12.8 oz (96.5 kg)  02/18/21 215 lb (97.5 kg)     Health Maintenance Due  Topic Date Due   FOOT EXAM  Never done   URINE MICROALBUMIN  Never done   TETANUS/TDAP  Never done   Zoster Vaccines- Shingrix (1 of 2) Never done   COVID-19 Vaccine (3 - Pfizer risk series) 12/10/2019    There are no preventive care reminders to display for this patient.  Lab Results  Component Value Date   TSH 2.490 10/10/2020   Lab Results  Component Value Date   WBC 5.1 02/20/2021   HGB 12.4 (L) 02/20/2021   HCT 37.6 (L) 02/20/2021   MCV 93.1  02/20/2021   PLT 187 02/20/2021   Lab Results  Component Value Date   NA 139 02/20/2021   K 3.8 02/20/2021   CHLORIDE 100 01/17/2017   CO2 27 02/20/2021   GLUCOSE 101 (H) 02/20/2021   BUN 16 02/20/2021   CREATININE 0.94 02/20/2021   BILITOT 1.0 02/20/2021   ALKPHOS 88 02/20/2021   AST 13 (L) 02/20/2021   ALT 13 02/20/2021   PROT 6.6 02/20/2021   ALBUMIN 3.1 (L) 02/20/2021   CALCIUM 8.9 02/20/2021   ANIONGAP 10 02/20/2021   EGFR >60 09/01/2017   GFR 109.67 11/13/2015   Lab Results  Component Value Date   CHOL 161 10/10/2020   Lab Results  Component Value Date   HDL 74 10/10/2020   Lab Results  Component Value Date   LDLCALC 59 10/10/2020   Lab Results  Component Value Date   TRIG 175 (H) 10/10/2020   Lab Results  Component Value Date   CHOLHDL 2.2 10/10/2020   Lab Results  Component Value Date   HGBA1C 6.0 (H) 02/19/2021      Assessment & Plan:   #1 recent left facial cellulitis with odontogenic abscess improved.  Patient on Augmentin for that.  He has pending follow-up with oral surgeon  #2 acute right foot swelling.  This appears to be cellulitis.  Already on Augmentin as above. Add coverage for MRSA with doxycycline 100 mg twice daily and continue Augmentin -Elevate foot frequently -Follow-up immediately for any progressive redness or fever  -Reassess within 4 days and will  plan to repeat INR at that time  #3 history of atrial fibrillation on chronic Coumadin -Recheck INR at follow-up as above  Meds ordered this encounter  Medications   doxycycline (VIBRAMYCIN) 100 MG capsule    Sig: Take 1 capsule (100 mg total) by mouth 2 (two) times daily.    Dispense:  20 capsule    Refill:  0    Follow-up: Return in about 4 days (around 03/06/2021).    Evelena Peat, MD

## 2021-03-02 NOTE — Patient Instructions (Signed)
Elevate foot frequently  Watch for any systemic fever  Follow up in 4 days and sooner for any fever or progressive redness or swelling.

## 2021-03-06 ENCOUNTER — Other Ambulatory Visit: Payer: Self-pay

## 2021-03-06 ENCOUNTER — Telehealth: Payer: Self-pay | Admitting: Family Medicine

## 2021-03-06 ENCOUNTER — Ambulatory Visit (INDEPENDENT_AMBULATORY_CARE_PROVIDER_SITE_OTHER): Payer: Medicare HMO | Admitting: Family Medicine

## 2021-03-06 VITALS — BP 130/60 | HR 80 | Temp 98.0°F | Wt 211.0 lb

## 2021-03-06 DIAGNOSIS — Z7901 Long term (current) use of anticoagulants: Secondary | ICD-10-CM | POA: Diagnosis not present

## 2021-03-06 DIAGNOSIS — L03115 Cellulitis of right lower limb: Secondary | ICD-10-CM

## 2021-03-06 DIAGNOSIS — R269 Unspecified abnormalities of gait and mobility: Secondary | ICD-10-CM

## 2021-03-06 DIAGNOSIS — M216X2 Other acquired deformities of left foot: Secondary | ICD-10-CM | POA: Diagnosis not present

## 2021-03-06 DIAGNOSIS — I480 Paroxysmal atrial fibrillation: Secondary | ICD-10-CM

## 2021-03-06 LAB — POCT INR: INR: 3.9 — AB (ref 2.0–3.0)

## 2021-03-06 NOTE — Progress Notes (Signed)
Established Patient Office Visit  Subjective:  Patient ID: Collin [name], male    DOB: April 27, 1941  Age: 80 y.o. MRN: [id number]  CC:  Chief Complaint  Patient presents with   Follow-up    Cellulitis, R foot, has gotten a little better    HPI Collin [name] presents for follow-up for several items  Recent hospitalization for facial cellulitis.  That seems to have resolved.  He was in earlier in the week with right foot swelling, pain, and erythema.  Suspected cellulitis.  No history of gout.  This looks more consistent with cellulitis.  He was already finishing Augmentin and we added doxycycline for MRSA coverage.  Wife thinks his foot looks better overall with less redness and less swelling especially in the morning when he first gets up.  With dependency does get somewhat more swollen.  Ambulating without difficulty.  He is on Coumadin and we recommended follow-up today to reassess his INR with combination antibiotic therapy.  His usual Coumadin regimen is 5 mg daily except for 2.5 mg on Wednesday  Wife is concerned because of progressive gait abnormality.  She states he has excessive pronation on the left to the extent is almost walking on the inside of his foot.  She would like to consult with orthopedics regarding that.  Past Medical History:  Diagnosis Date   AICD (automatic cardioverter/defibrillator) present    Atrial fibrillation (HCC)    Cancer (HCC)    tongue cancer   Cardiomyopathy, nonischemic (HCC) 1998   CHF (congestive heart failure) (HCC)    Claustrophobia    Diabetes mellitus    Dysrhythmia    Heart disease    History of echocardiogram    Echo 8/18:  EF 55-60, no RWMA, severe LAE, normal RVSF, mild RAE   History of radiation therapy 12/07/16- 01/24/17   Base of Tongue 70 Gy in 35 fractions   Hyperlipidemia    Memory loss    OSA (obstructive sleep apnea)    Shingles    Umbilical hernia    Vein disorder    he reports that he has an extra vein around his  heart that is not connected. It sometimes shows as a shadow on scans.     Past Surgical History:  Procedure Laterality Date   CARDIAC DEFIBRILLATOR PLACEMENT  2006   CATARACT EXTRACTION Bilateral    01/07/2016. 01/22/2016   CYST EXCISION     multiple drainage to a cyst in his neck.    DIRECT LARYNGOSCOPY N/A 11/16/2016   Procedure: DIRECT LARYNGOSCOPY AND BIOPSY;  Surgeon: [name], MD;  Location: [facility] OR;  Service: ENT;  Laterality: N/A;   EYE SURGERY     HERNIA REPAIR  1950s   IMPLANTABLE CARDIOVERTER DEFIBRILLATOR (ICD) GENERATOR CHANGE N/A 05/10/2014   Procedure: ICD GENERATOR CHANGE;  Surgeon: [name], MD;  Location: [facility] CATH LAB;  Service: Cardiovascular;  Laterality: N/A;   INSERTION OF MESH N/A 01/18/2018   Procedure: INSERTION OF MESH;  Surgeon: [name], MD;  Location: [facility] OR;  Service: General;  Laterality: N/A;   IR GASTROSTOMY TUBE MOD SED  12/27/2016   IR GASTROSTOMY TUBE REMOVAL  03/24/2018   IR PATIENT EVAL TECH 0-60 MINS  01/17/2017   MASS EXCISION Right 11/16/2016   Procedure: EXCISION RIGHT NECK LIPOMA;  Surgeon: [name], MD;  Location: [facility] OR;  Service: ENT;  Laterality: Right;   MOHS SURGERY  01/01/2015   to top of head   MOUTH  SURGERY     teeth removed 4 teeth extracted 02/2010   RADICAL NECK DISSECTION Right 07/25/2017   Procedure: RIGHT NECK DISSECTION;  Surgeon: [name], MD;  Location: [facility] OR;  Service: ENT;  Laterality: Right;   UMBILICAL HERNIA REPAIR  01/18/2018   w/mesh   UMBILICAL HERNIA REPAIR N/A 01/18/2018   Procedure: UMBILICAL HERNIA REPAIR WITH MESH;  Surgeon: [name], MD;  Location: [facility] OR;  Service: General;  Laterality: N/A;    Family History  Problem Relation Age of Onset   Heart disease Mother    Sudden death Mother        under age 42   Lupus Mother    Hypertension Father    Stroke Father     Social History   Socioeconomic History   Marital status: Married    Spouse name: [name]   Number  of children: 3   Years of education: Not on file   Highest education level: Not on file  Occupational History   Occupation: Retired Engineer, materials  Tobacco Use   Smoking status: Never   Smokeless tobacco: Never  Vaping Use   Vaping Use: Never used  Substance and Sexual Activity   Alcohol use: Yes    Comment: "a little wine every once in a while" 01/17/2018   Drug use: No   Sexual activity: Not on file  Other Topics Concern   Not on file  Social History Narrative   Pt lives in split level home with his wife, Aggie Cosier.   Has 3 sons   9th grade education   Retired Electrical engineer   Right handed   Social Determinants of Health   Financial Resource Strain: Not on file  Food Insecurity: Not on file  Transportation Needs: Not on file  Physical Activity: Not on file  Stress: Not on file  Social Connections: Not on file  Intimate Partner Violence: Not on file    Outpatient Medications Prior to Visit  Medication Sig Dispense Refill   acetaminophen (TYLENOL) 500 MG tablet Take 1,000 mg by mouth daily as needed for moderate pain.      amoxicillin-clavulanate (AUGMENTIN) 875-125 MG tablet Take 1 tablet by mouth 2 (two) times daily for 14 days. 28 tablet 0   carvedilol (COREG) 25 MG tablet TAKE 1 TABLET BY MOUTH TWO TIMES A DAY NEEDS APPOINTMENT 180 tablet 2   Cholecalciferol (VITAMIN D3) 50 MCG (2000 UT) TABS Take 1 tablet by mouth daily.     diltiazem (CARDIZEM) 90 MG tablet TAKE ONE TABLET BY MOUTH DAILY 90 tablet 3   donepezil (ARICEPT) 10 MG tablet TAKE ONE TABLET BY MOUTH DAILY 90 tablet 0   doxycycline (VIBRAMYCIN) 100 MG capsule Take 1 capsule (100 mg total) by mouth 2 (two) times daily. 20 capsule 0   memantine (NAMENDA) 10 MG tablet Take 1 tablet every day for 2 weeks, then increase to 1 tablet twice a day and continue 60 tablet 11   PARoxetine (PAXIL) 20 MG tablet Take 1 tablet (20 mg total) by mouth daily. 90 tablet 3   polyethylene glycol (MIRALAX) packet Take 17 g by  mouth daily as needed. Available OTC 30 each 0   potassium chloride (KLOR-CON) 10 MEQ tablet DISSOLVE 1 TABLET IN WATER AND ADMINISTER VIA PEG TUBE TAKE ONE TABLET BY MOUTH DAILY 90 tablet 3   rosuvastatin (CRESTOR) 10 MG tablet Take 1 tablet (10 mg total) by mouth daily. 90 tablet 2   torsemide (DEMADEX) 20 MG tablet  TAKE ONE AND ONE-HALF TABLETS (30 MG) BY MOUTH DAILY, ALTERNATING WITH 1 TABLET (20 MG) EVERY THIRD DAY 180 tablet 3   warfarin (COUMADIN) 5 MG tablet Take as instructed by the Coumadin clinic. 90 tablet 0   escitalopram (LEXAPRO) 20 MG tablet Take 20 mg by mouth daily.     No facility-administered medications prior to visit.    Allergies  Allergen Reactions   Niaspan [Niacin Er] Other (See Comments)    FLUSHED    ROS Review of Systems  Constitutional:  Negative for chills and fever.  Respiratory:  Negative for cough and shortness of breath.   Cardiovascular:  Negative for chest pain.  Neurological:  Negative for weakness.     Objective:    Physical Exam Vitals reviewed.  Cardiovascular:     Rate and Rhythm: Normal rate.  Pulmonary:     Effort: Pulmonary effort is normal.     Breath sounds: Normal breath sounds.  Musculoskeletal:     Comments: He does have excessive pronation left foot with standing.  Skin:    Comments: Right foot edema remains.  He has some diffuse erythema though much less than it was earlier in the week.  Minimal warmth.  Minimal tenderness.  Excellent capillary refill.  No ulcerations.  Neurological:     Mental Status: He is alert.    BP 130/60 (BP Location: Left Arm, Patient Position: Sitting, Cuff Size: Normal)   Pulse 80   Temp 98 F (36.7 C) (Oral)   Wt 211 lb (95.7 kg)   SpO2 96%   BMI 27.84 kg/m  Wt Readings from Last 3 Encounters:  03/06/21 211 lb (95.7 kg)  03/02/21 209 lb 14.4 oz (95.2 kg)  02/26/21 212 lb 12.8 oz (96.5 kg)     Health Maintenance Due  Topic Date Due   FOOT EXAM  Never done   URINE MICROALBUMIN   Never done   TETANUS/TDAP  Never done   Zoster Vaccines- Shingrix (1 of 2) Never done   COVID-19 Vaccine (3 - Pfizer risk series) 12/10/2019    There are no preventive care reminders to display for this patient.  Lab Results  Component Value Date   TSH 2.490 10/10/2020   Lab Results  Component Value Date   WBC 5.1 02/20/2021   HGB 12.4 (L) 02/20/2021   HCT 37.6 (L) 02/20/2021   MCV 93.1 02/20/2021   PLT 187 02/20/2021   Lab Results  Component Value Date   NA 139 02/20/2021   K 3.8 02/20/2021   CHLORIDE 100 01/17/2017   CO2 27 02/20/2021   GLUCOSE 101 (H) 02/20/2021   BUN 16 02/20/2021   CREATININE 0.94 02/20/2021   BILITOT 1.0 02/20/2021   ALKPHOS 88 02/20/2021   AST 13 (L) 02/20/2021   ALT 13 02/20/2021   PROT 6.6 02/20/2021   ALBUMIN 3.1 (L) 02/20/2021   CALCIUM 8.9 02/20/2021   ANIONGAP 10 02/20/2021   EGFR >60 09/01/2017   GFR 109.67 11/13/2015   Lab Results  Component Value Date   CHOL 161 10/10/2020   Lab Results  Component Value Date   HDL 74 10/10/2020   Lab Results  Component Value Date   LDLCALC 59 10/10/2020   Lab Results  Component Value Date   TRIG 175 (H) 10/10/2020   Lab Results  Component Value Date   CHOLHDL 2.2 10/10/2020   Lab Results  Component Value Date   HGBA1C 6.0 (H) 02/19/2021      Assessment & Plan:   #  1 cellulitis right foot which is gradually improving.  Less erythema.  Still has significant edema.  Continue dual coverage with Augmentin and doxycycline.  Continue to elevate frequently.  Be in touch of erythema not continuing to improve by next week  #2 excessive pronation left foot. -Set up orthopedic assessment with foot specialist  #3 chronic Coumadin therapy.  INR was checked today because of his dual antibiotic therapy and came back 3.9.  We have instructed him to hold his Coumadin today and then take half tablet tomorrow then resume his regular dosing.  Follow-up with Coumadin clinic next week to  reassess   No orders of the defined types were placed in this encounter.   Follow-up: No follow-ups on file.    Evelena Peat, MD

## 2021-03-06 NOTE — Telephone Encounter (Signed)
Letter written. Spoke with the patients wife and she is aware. She would like to pick this up. Letter printed and placed up front for pick up.

## 2021-03-06 NOTE — Telephone Encounter (Signed)
Patient wife calling stating the patient was seen today and forgot to get a note for patient stating he can return back to memory care at [facility]. Please call Clarene Critchley at [phone].

## 2021-03-06 NOTE — Patient Instructions (Signed)
HOLD the Coumadin for today and then take ONE HALF tablet tomorrow and then continue with usual schedule of 5 mg daily except for 2.5 mg on Wednesdays.    Elevate foot as much as possible  Watch for increased redness, warmth, or fever.

## 2021-03-13 DIAGNOSIS — H40013 Open angle with borderline findings, low risk, bilateral: Secondary | ICD-10-CM | POA: Diagnosis not present

## 2021-03-13 DIAGNOSIS — E119 Type 2 diabetes mellitus without complications: Secondary | ICD-10-CM | POA: Diagnosis not present

## 2021-03-13 DIAGNOSIS — H35373 Puckering of macula, bilateral: Secondary | ICD-10-CM | POA: Diagnosis not present

## 2021-03-13 DIAGNOSIS — H524 Presbyopia: Secondary | ICD-10-CM | POA: Diagnosis not present

## 2021-03-13 DIAGNOSIS — Z961 Presence of intraocular lens: Secondary | ICD-10-CM | POA: Diagnosis not present

## 2021-03-13 LAB — HM DIABETES EYE EXAM

## 2021-03-16 DIAGNOSIS — M67962 Unspecified disorder of synovium and tendon, left lower leg: Secondary | ICD-10-CM | POA: Diagnosis not present

## 2021-03-16 DIAGNOSIS — M79672 Pain in left foot: Secondary | ICD-10-CM | POA: Diagnosis not present

## 2021-03-16 DIAGNOSIS — M25572 Pain in left ankle and joints of left foot: Secondary | ICD-10-CM | POA: Diagnosis not present

## 2021-03-17 ENCOUNTER — Ambulatory Visit (INDEPENDENT_AMBULATORY_CARE_PROVIDER_SITE_OTHER): Payer: Medicare HMO

## 2021-03-17 ENCOUNTER — Encounter: Payer: Self-pay | Admitting: Family Medicine

## 2021-03-17 DIAGNOSIS — I428 Other cardiomyopathies: Secondary | ICD-10-CM | POA: Diagnosis not present

## 2021-03-17 LAB — CUP PACEART REMOTE DEVICE CHECK
Battery Remaining Longevity: 114 mo
Battery Remaining Percentage: 100 %
Brady Statistic RV Percent Paced: 19 %
Date Time Interrogation Session: 20220712054300
HighPow Impedance: 43 Ohm
Implantable Lead Implant Date: 20060209
Implantable Lead Location: 753860
Implantable Lead Model: 158
Implantable Lead Serial Number: 156422
Implantable Pulse Generator Implant Date: 20150904
Lead Channel Impedance Value: 369 Ohm
Lead Channel Pacing Threshold Amplitude: 1 V
Lead Channel Pacing Threshold Pulse Width: 0.4 ms
Lead Channel Setting Pacing Amplitude: 2.4 V
Lead Channel Setting Pacing Pulse Width: 0.4 ms
Lead Channel Setting Sensing Sensitivity: 0.5 mV
Pulse Gen Serial Number: 191056

## 2021-04-09 NOTE — Progress Notes (Signed)
Remote ICD transmission.   

## 2021-04-12 ENCOUNTER — Other Ambulatory Visit: Payer: Self-pay | Admitting: Neurology

## 2021-04-13 ENCOUNTER — Ambulatory Visit (INDEPENDENT_AMBULATORY_CARE_PROVIDER_SITE_OTHER): Payer: Medicare HMO | Admitting: General Practice

## 2021-04-13 ENCOUNTER — Other Ambulatory Visit: Payer: Self-pay

## 2021-04-13 DIAGNOSIS — I480 Paroxysmal atrial fibrillation: Secondary | ICD-10-CM

## 2021-04-13 DIAGNOSIS — Z7901 Long term (current) use of anticoagulants: Secondary | ICD-10-CM

## 2021-04-13 LAB — POCT INR: INR: 2.1 (ref 2.0–3.0)

## 2021-04-13 NOTE — Patient Instructions (Addendum)
Pre visit review using our clinic review tool, if applicable. No additional management support is needed unless otherwise documented below in the visit note.  Continue to take 1 tablet daily except 1/2 tablet on Wednesdays.  Recheck in 6 wks.

## 2021-04-16 ENCOUNTER — Other Ambulatory Visit: Payer: Self-pay

## 2021-04-17 ENCOUNTER — Encounter: Payer: Self-pay | Admitting: Family Medicine

## 2021-04-17 ENCOUNTER — Ambulatory Visit (INDEPENDENT_AMBULATORY_CARE_PROVIDER_SITE_OTHER): Payer: Medicare HMO | Admitting: Family Medicine

## 2021-04-17 VITALS — BP 120/58 | HR 67 | Temp 97.6°F | Ht 73.0 in | Wt 217.3 lb

## 2021-04-17 DIAGNOSIS — R413 Other amnesia: Secondary | ICD-10-CM | POA: Insufficient documentation

## 2021-04-17 DIAGNOSIS — Z0000 Encounter for general adult medical examination without abnormal findings: Secondary | ICD-10-CM | POA: Diagnosis not present

## 2021-04-17 NOTE — Patient Instructions (Signed)
Remember to get Flu vaccine this Fall  Consider Shingrix vaccine and can check at pharmacy.    Would also check on insurance coverage.

## 2021-04-17 NOTE — Progress Notes (Signed)
Established Patient Office Visit  Subjective:  Patient ID: Collin [name], male    DOB: [birth year]  Age: 80 y.o. MRN: [id number]  CC:  Chief Complaint  Patient presents with   Annual Exam    No new concerns     HPI Collin [name] presents for physical exam.  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  He also has history of memory loss and currently is in a day program where he gets breakfast and lunch each day and is engaged in group activities.  This is a memory care center.  Health maintenance reviewed  -Pneumonia vaccines complete -Declines tetanus at this time -Has had shingles clinically in the past.  Declines shingles vaccine.  Social history-is been married for 37 years.  Stepchild from current marriage and he has 2 children of his own.  Non-smoker.  No alcohol.  Family history-his mother had heart disease.  She also had history of lupus.  Father had hypertension and stroke history.  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 It sometimes shows as a shadow on scans.     Past Surgical History:   Procedure Laterality Date   CARDIAC DEFIBRILLATOR PLACEMENT  2006   CATARACT EXTRACTION Bilateral    01/07/2016. 01/22/2016   CYST EXCISION     multiple drainage to a cyst in his neck.    DIRECT LARYNGOSCOPY N/A 11/16/2016   Procedure: DIRECT LARYNGOSCOPY AND BIOPSY;  Surgeon: [name], MD;  Location: [facility] OR;  Service: ENT;  Laterality: N/A;   EYE SURGERY     HERNIA REPAIR  1950s   IMPLANTABLE CARDIOVERTER DEFIBRILLATOR (ICD) GENERATOR CHANGE N/A 05/10/2014   Procedure: ICD GENERATOR CHANGE;  Surgeon: [name], MD;  Location: [facility] CATH LAB;  Service: Cardiovascular;  Laterality: N/A;   INSERTION OF MESH N/A 01/18/2018   Procedure: INSERTION OF MESH;  Surgeon: [name], MD;  Location: [facility] OR;  Service: General;  Laterality: N/A;   IR GASTROSTOMY TUBE MOD SED  12/27/2016   IR GASTROSTOMY TUBE REMOVAL  03/24/2018   IR PATIENT EVAL TECH 0-60 MINS  01/17/2017   MASS EXCISION Right 11/16/2016   Procedure: EXCISION RIGHT NECK LIPOMA;  Surgeon: [name], MD;  Location: [facility] OR;  Service: ENT;  Laterality: Right;   MOHS SURGERY  01/01/2015   to top of head   MOUTH SURGERY     teeth removed 4 teeth extracted 02/2010   RADICAL NECK DISSECTION Right 07/25/2017   Procedure: RIGHT  NECK DISSECTION;  Surgeon: [name], MD;  Location: [facility] OR;  Service: ENT;  Laterality: Right;   UMBILICAL HERNIA REPAIR  01/18/2018   w/mesh   UMBILICAL HERNIA REPAIR N/A 01/18/2018   Procedure: UMBILICAL HERNIA REPAIR WITH MESH;  Surgeon: [name], MD;  Location: [facility] OR;  Service: General;  Laterality: N/A;    Family History  Problem Relation Age of Onset   Heart disease Mother    Sudden death Mother        under age 37   Lupus Mother    Hypertension Father    Stroke Father     Social History   Socioeconomic History   Marital status: Married    Spouse name: [name]   Number of children: 3   Years of education: Not on file   Highest education level: Not on file   Occupational History   Occupation: Retired Engineer, materials  Tobacco Use   Smoking status: Never   Smokeless tobacco: Never  Vaping Use   Vaping Use: Never used  Substance and Sexual Activity   Alcohol use: Yes    Comment: "a little wine every once in a while" 01/17/2018   Drug use: No   Sexual activity: Not on file  Other Topics Concern   Not on file  Social History Narrative   Pt lives in split level home with his wife, Aggie Cosier.   Has 3 sons   9th grade education   Retired Electrical engineer   Right handed   Social Determinants of Health   Financial Resource Strain: Not on file  Food Insecurity: Not on file  Transportation Needs: Not on file  Physical Activity: Not on file  Stress: Not on file  Social Connections: Not on file  Intimate Partner Violence: Not on file    Outpatient Medications Prior to Visit  Medication Sig Dispense Refill   acetaminophen (TYLENOL) 500 MG tablet Take 1,000 mg by mouth daily as needed for moderate pain.      carvedilol (COREG) 25 MG tablet TAKE 1 TABLET BY MOUTH TWO TIMES A DAY NEEDS APPOINTMENT 180 tablet 2   Cholecalciferol (VITAMIN D3) 50 MCG (2000 UT) TABS Take 1 tablet by mouth daily.     diltiazem (CARDIZEM) 90 MG tablet TAKE ONE TABLET BY MOUTH DAILY 90 tablet 3   donepezil (ARICEPT) 10 MG tablet TAKE ONE TABLET BY MOUTH DAILY 90 tablet 0   memantine (NAMENDA) 10 MG tablet Take 1 tablet every day for 2 weeks, then increase to 1 tablet twice a day and continue 60 tablet 11   PARoxetine (PAXIL) 20 MG tablet Take 1 tablet (20 mg total) by mouth daily. 90 tablet 3   polyethylene glycol (MIRALAX) packet Take 17 g by mouth daily as needed. Available OTC 30 each 0   potassium chloride (KLOR-CON) 10 MEQ tablet DISSOLVE 1 TABLET IN WATER AND ADMINISTER VIA PEG TUBE TAKE ONE TABLET BY MOUTH DAILY 90 tablet 3   rosuvastatin (CRESTOR) 10 MG tablet Take 1 tablet (10 mg total) by mouth daily. 90 tablet 2   torsemide (DEMADEX) 20 MG tablet TAKE ONE AND  ONE-HALF TABLETS (30 MG) BY MOUTH DAILY, ALTERNATING WITH 1 TABLET (20 MG) EVERY THIRD DAY 180 tablet 3   warfarin (COUMADIN) 5 MG tablet Take as instructed by the Coumadin clinic. 90 tablet 0   doxycycline (VIBRAMYCIN) 100 MG capsule Take 1 capsule (100 mg total) by mouth 2 (two) times daily. 20 capsule 0   No facility-administered medications  Established Patient Office Visit  Subjective:  Patient ID: HRIDAAN BOUSE, male    DOB: 06-29-1941  Age: 80 y.o. MRN: [id number]  CC:  Chief Complaint  Patient presents with   Annual Exam    No new concerns     HPI Collin [name] presents for physical exam.  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  He also has history of memory loss and currently is in a day program where he gets breakfast and lunch each day and is engaged in group activities.  This is a memory care center.  Health maintenance reviewed  -Pneumonia vaccines complete -Declines tetanus at this time -Has had shingles clinically in the past.  Declines shingles vaccine.  Social history-is been married for 37 years.  Stepchild from current marriage and he has 2 children of his own.  Non-smoker.  No alcohol.  Family history-his mother had heart disease.  She also had history of lupus.  Father had hypertension and stroke history.  Past Medical History:  Diagnosis Date   AICD (automatic cardioverter/defibrillator) present    Atrial fibrillation ([place])    Cancer ([place])    tongue cancer   Cardiomyopathy, nonischemic ([place]) 1998   CHF (congestive heart failure) ([place])    Claustrophobia    Diabetes mellitus    Dysrhythmia    Heart disease    History of echocardiogram    Echo 8/18:  EF 55-60, no RWMA, severe LAE, normal RVSF, mild RAE   History of radiation therapy 12/07/16- 01/24/17   Base of Tongue 70 Gy in 35 fractions   Hyperlipidemia    Memory loss    OSA (obstructive sleep apnea)    Shingles    Umbilical hernia    Vein disorder    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 It sometimes shows as a shadow on scans.     Past Surgical History:   Procedure Laterality Date   CARDIAC DEFIBRILLATOR PLACEMENT  2006   CATARACT EXTRACTION Bilateral    01/07/2016. 01/22/2016   CYST EXCISION     multiple drainage to a cyst in his neck.    DIRECT LARYNGOSCOPY N/A 11/16/2016   Procedure: DIRECT LARYNGOSCOPY AND BIOPSY;  Surgeon: Rozetta Nunnery, MD;  Location: [place];  Service: ENT;  Laterality: N/A;   EYE SURGERY     HERNIA REPAIR  1950s   IMPLANTABLE CARDIOVERTER DEFIBRILLATOR (ICD) GENERATOR CHANGE N/A 05/10/2014   Procedure: ICD GENERATOR CHANGE;  Surgeon: [name], MD;  Location[facility] C[facility] LAB;  Service: Cardiovascular;  Laterality: N/A;   INSERTION OF MESH N/A 01/18/2018   Procedure: INSERTION OF MESH;  Surgeon: Jovita Kussmaul, MD;  Location: [place];  Service: General;  Laterality: N/A;   IR GASTROSTOMY TUBE MOD SED  12/27/2016   IR GASTROSTOMY TUBE REMOVAL  03/24/2018   IR PATIENT EVAL TECH 0-60 MINS  01/17/2017   MASS EXCISION Right 11/16/2016   Procedure: EXCISION RIGHT NECK LIPOMA;  Surgeon: Rozetta Nunnery, MD;  Location: [place];  Service: ENT;  Laterality: Right;   MOHS SURGERY  01/01/2015   to top of head   MOUTH SURGERY     teeth removed 4 teeth extracted 02/2010   RADICAL NECK DISSECTION Right 07/25/2017   Procedure: RIGHT  10/10/2020   Lab Results  Component Value Date   LDLCALC 59 10/10/2020   Lab Results  Component Value Date   TRIG 175 (H) 10/10/2020   Lab Results  Component Value Date   CHOLHDL 2.2 10/10/2020   Lab Results  Component Value Date   HGBA1C 6.0 (H) 02/19/2021      Assessment & Plan:   Physical exam.  Patient has history of memory loss and is currently in a day memory care center which seems to be working  well for him.  This is also been beneficial for his wife.  He has past history of type 2 diabetes which is reversed with weight loss following treatments for his tongue cancer.  We discussed the following health maintenance issues  -No labs indicated at this time.  He had recent A1c 6.0%.  Has had multiple other labs and would not recommend PSA screening at his age and with co-morbidities. -Continue annual flu vaccine -Pneumonia vaccines complete   No orders of the defined types were placed in this encounter.   Follow-up: No follow-ups on file.    Evelena Peat, MD

## 2021-05-12 ENCOUNTER — Ambulatory Visit: Payer: Medicare HMO | Admitting: Adult Health

## 2021-05-12 NOTE — Progress Notes (Deleted)
Electrophysiology Office Note Date: 05/12/2021  ID:  Collin [name], DOB [birth year], MRN [id number]  PCP: [name], MD Primary Cardiologist: Dietrich Pates, MD Electrophysiologist: Lewayne Bunting, MD   CC: Routine ICD follow-up  Collin [name] is a 80 y.o. male seen today for Lewayne Bunting, MD for routine electrophysiology followup.  Since last being seen in our clinic the patient reports doing ***.  he denies chest pain, palpitations, dyspnea, PND, orthopnea, nausea, vomiting, dizziness, syncope, edema, weight gain, or early satiety. {He/she (caps):30048} has not had ICD shocks.   Device History: Magazine features editor ICD implanted 2006, gen change 05/2014 for CHF   Past Medical History:  Diagnosis Date   AICD (automatic cardioverter/defibrillator) present    Atrial fibrillation (HCC)    Cancer (HCC)    tongue cancer   Cardiomyopathy, nonischemic (HCC) 1998   CHF (congestive heart failure) (HCC)    Claustrophobia    Diabetes mellitus    Dysrhythmia    Heart disease    History of echocardiogram    Echo 8/18:  EF 55-60, no RWMA, severe LAE, normal RVSF, mild RAE   History of radiation therapy 12/07/16- 01/24/17   Base of Tongue 70 Gy in 35 fractions   Hyperlipidemia    Memory loss    OSA (obstructive sleep apnea)    Shingles    Umbilical hernia    Vein disorder    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 It sometimes shows as a shadow on scans.    Past Surgical History:  Procedure Laterality Date   CARDIAC DEFIBRILLATOR PLACEMENT  2006   CATARACT EXTRACTION Bilateral    01/07/2016. 01/22/2016   CYST EXCISION     multiple drainage to a cyst in his neck.    DIRECT LARYNGOSCOPY N/A 11/16/2016   Procedure: DIRECT LARYNGOSCOPY AND BIOPSY;  Surgeon: [name], MD;  Location: [facility] OR;  Service: ENT;  Laterality: N/A;   EYE SURGERY     HERNIA REPAIR  1950s   IMPLANTABLE CARDIOVERTER DEFIBRILLATOR (ICD) GENERATOR  CHANGE N/A 05/10/2014   Procedure: ICD GENERATOR CHANGE;  Surgeon: [name], MD;  Location: [facility] CATH LAB;  Service: Cardiovascular;  Laterality: N/A;   INSERTION OF MESH N/A 01/18/2018   Procedure: INSERTION OF MESH;  Surgeon: [name], MD;  Location: [facility] OR;  Service: General;  Laterality: N/A;   IR GASTROSTOMY TUBE MOD SED  12/27/2016   IR GASTROSTOMY TUBE REMOVAL  03/24/2018   IR PATIENT EVAL TECH 0-60 MINS  01/17/2017   MASS EXCISION Right 11/16/2016   Procedure: EXCISION RIGHT NECK LIPOMA;  Surgeon: [name], MD;  Location: [facility] OR;  Service: ENT;  Laterality: Right;   MOHS SURGERY  01/01/2015   to top of head   MOUTH SURGERY     teeth removed 4 teeth extracted 02/2010   RADICAL NECK DISSECTION Right 07/25/2017   Procedure: RIGHT NECK DISSECTION;  Surgeon: [name], MD;  Location: [facility] OR;  Service: ENT;  Laterality: Right;   UMBILICAL HERNIA REPAIR  01/18/2018   w/mesh   UMBILICAL HERNIA REPAIR N/A 01/18/2018   Procedure: UMBILICAL HERNIA REPAIR WITH MESH;  Surgeon: [name], MD;  Location: [facility] OR;  Service: General;  Laterality: N/A;    Current Outpatient Medications  Medication Sig Dispense Refill   acetaminophen (TYLENOL) 500 MG tablet Take 1,000 mg by mouth daily as needed for moderate pain.  carvedilol (COREG) 25 MG tablet TAKE 1 TABLET BY MOUTH TWO TIMES A DAY NEEDS APPOINTMENT 180 tablet 2   Cholecalciferol (VITAMIN D3) 50 MCG (2000 UT) TABS Take 1 tablet by mouth daily.     diltiazem (CARDIZEM) 90 MG tablet TAKE ONE TABLET BY MOUTH DAILY 90 tablet 3   donepezil (ARICEPT) 10 MG tablet TAKE ONE TABLET BY MOUTH DAILY 90 tablet 0   memantine (NAMENDA) 10 MG tablet Take 1 tablet every day for 2 weeks, then increase to 1 tablet twice a day and continue 60 tablet 11   PARoxetine (PAXIL) 20 MG tablet Take 1 tablet (20 mg total) by mouth daily. 90 tablet 3   polyethylene glycol (MIRALAX) packet Take 17 g by mouth daily as needed. Available OTC 30 each  0   potassium chloride (KLOR-CON) 10 MEQ tablet DISSOLVE 1 TABLET IN WATER AND ADMINISTER VIA PEG TUBE TAKE ONE TABLET BY MOUTH DAILY 90 tablet 3   rosuvastatin (CRESTOR) 10 MG tablet Take 1 tablet (10 mg total) by mouth daily. 90 tablet 2   torsemide (DEMADEX) 20 MG tablet TAKE ONE AND ONE-HALF TABLETS (30 MG) BY MOUTH DAILY, ALTERNATING WITH 1 TABLET (20 MG) EVERY THIRD DAY 180 tablet 3   warfarin (COUMADIN) 5 MG tablet Take as instructed by the Coumadin clinic. 90 tablet 0   No current facility-administered medications for this visit.    Allergies:   Niaspan [niacin er]   Social History: Social History   Socioeconomic History   Marital status: Married    Spouse name: [name]   Number of children: 3   Years of education: Not on file   Highest education level: Not on file  Occupational History   Occupation: Retired Engineer, materials  Tobacco Use   Smoking status: Never   Smokeless tobacco: Never  Vaping Use   Vaping Use: Never used  Substance and Sexual Activity   Alcohol use: Yes    Comment: "a little wine every once in a while" 01/17/2018   Drug use: No   Sexual activity: Not on file  Other Topics Concern   Not on file  Social History Narrative   Pt lives in split level home with his wife, Aggie Cosier.   Has 3 sons   9th grade education   Retired Electrical engineer   Right handed   Social Determinants of Health   Financial Resource Strain: Not on file  Food Insecurity: Not on file  Transportation Needs: Not on file  Physical Activity: Not on file  Stress: Not on file  Social Connections: Not on file  Intimate Partner Violence: Not on file    Family History: Family History  Problem Relation Age of Onset   Heart disease Mother    Sudden death Mother        under age 24   Lupus Mother    Hypertension Father    Stroke Father     Review of Systems: All other systems reviewed and are otherwise negative except as noted above.   Physical Exam: There were no vitals  filed for this visit.   GEN- The patient is well appearing, alert and oriented x 3 today.   HEENT: normocephalic, atraumatic; sclera clear, conjunctiva pink; hearing intact; oropharynx clear; neck supple, no JVP Lymph- no cervical lymphadenopathy Lungs- Clear to ausculation bilaterally, normal work of breathing.  No wheezes, rales, rhonchi Heart- Regular rate and rhythm, no murmurs, rubs or gallops, PMI not laterally displaced GI- soft, non-tender, non-distended, bowel sounds present, no  hepatosplenomegaly Extremities- no clubbing or cyanosis. No edema; DP/PT/radial pulses 2+ bilaterally MS- no significant deformity or atrophy Skin- warm and dry, no rash or lesion; ICD pocket well healed Psych- euthymic mood, full affect Neuro- strength and sensation are intact  ICD interrogation- reviewed in detail today,  See PACEART report  EKG:  EKG is not ordered today. Recent Labs: 10/10/2020: TSH 2.490 02/20/2021: ALT 13; BUN 16; Creatinine, Ser 0.94; Hemoglobin 12.4; Magnesium 2.2; Platelets 187; Potassium 3.8; Sodium 139   Wt Readings from Last 3 Encounters:  04/17/21 217 lb 4.8 oz (98.6 kg)  03/06/21 211 lb (95.7 kg)  03/02/21 209 lb 14.4 oz (95.2 kg)     Other studies Reviewed: Additional studies/ records that were reviewed today include: Previous EP office notes   Assessment and Plan:  1.  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 VT *** Labs today  3. Tongue CA  4. Atrial fibrillation  Current medicines are reviewed at length with the patient today.   The patient {ACTIONS; HAS/DOES NOT HAVE:19233} concerns regarding his medicines.  The following changes were made today:  {NONE DEFAULTED:18576}  Labs/ tests ordered today include: *** No orders of the defined types were placed in this encounter.    Disposition:   Follow up  with {Blank single:19197::"Dr. Allred","Dr. Amada Jupiter. Klein","Dr. Camnitz","Dr. Lambert","EP APP"}  {gen number [id number]:096045} {TIME; UNITS DAY/WEEK/MONTH:19136}   Signed, Graciella Freer, PA-C  05/12/2021 10:54 AM  [facility] [address] [place] [address] [phone] (office) [phone] (fax)

## 2021-05-13 ENCOUNTER — Telehealth (INDEPENDENT_AMBULATORY_CARE_PROVIDER_SITE_OTHER): Payer: Medicare HMO | Admitting: Family Medicine

## 2021-05-13 ENCOUNTER — Other Ambulatory Visit: Payer: Self-pay

## 2021-05-13 ENCOUNTER — Encounter: Payer: Medicare HMO | Admitting: Student

## 2021-05-13 DIAGNOSIS — R5381 Other malaise: Secondary | ICD-10-CM | POA: Diagnosis not present

## 2021-05-13 DIAGNOSIS — J019 Acute sinusitis, unspecified: Secondary | ICD-10-CM

## 2021-05-13 MED ORDER — AMOXICILLIN-POT CLAVULANATE 875-125 MG PO TABS: 1.0000 | ORAL_TABLET | Freq: Two times a day (BID) | ORAL | 0 refills | Status: DC

## 2021-05-13 NOTE — Progress Notes (Signed)
Patient ID: Collin [name], male   DOB: 09/04/1941, 80 y.o.   MRN: [id number]  This visit type was conducted due to national recommendations for restrictions regarding the COVID-19 pandemic in an effort to limit this patient's exposure and mitigate transmission in our community.   Virtual Visit via Video Note  I connected with Collin [name] on 05/13/21 at  5:00 PM EDT by a video enabled telemedicine application and verified that I am speaking with the correct person using two identifiers.  Location patient: home Location provider:work or home office Persons participating in the virtual visit: patient, provider, and his wife who assisted with the call.  Patient does have some cognitive impairment and wife helped provide some of the history.  I discussed the limitations of evaluation and management by telemedicine and the availability of in person appointments. The patient expressed understanding and agreed to proceed.   HPI:  He has chronic problems including history of atrial fibrillation, chronic diastolic heart failure, history of tongue cancer, type 2 diabetes, history of odontogenic infection of the jaw, chronic Coumadin  Has had some cough and nasal congestion for the past several weeks.  They have done 2 different COVID test which have both been negative.  He has had loss of taste and smell.  To some extent he had some loss of taste since his tongue surgery but especially over the past couple weeks.  Some nasal congestion and discharge but mostly clear.  Some productive cough.  He had admission back in June for facial cellulitis which eventually improved with antibiotics.  CT maxillofacial at that time showed poor dentition with multifocal odontogenic cellulitis suspected with some periorbital cellulitis tracking up into the left nasolabial fold.  Evidence for acute on chronic paranasal sinus disease with question of odontogenic originating from the left maxillary sinus.  No reported  fevers.  Wife has not seen any recurrent visible facial swelling.   ROS: See pertinent positives and negatives per HPI.  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 It sometimes shows as a shadow on scans.     Past Surgical History:  Procedure Laterality Date   CARDIAC DEFIBRILLATOR PLACEMENT  2006   CATARACT EXTRACTION Bilateral    01/07/2016. 01/22/2016   CYST EXCISION     multiple drainage to a cyst in his neck.    DIRECT LARYNGOSCOPY N/A 11/16/2016   Procedure: DIRECT LARYNGOSCOPY AND BIOPSY;  Surgeon: [name], MD;  Location: [facility] OR;  Service: ENT;  Laterality: N/A;   EYE SURGERY     HERNIA REPAIR  1950s   IMPLANTABLE CARDIOVERTER DEFIBRILLATOR (ICD) GENERATOR CHANGE N/A 05/10/2014   Procedure: ICD GENERATOR CHANGE;  Surgeon: [name], MD;  Location: [facility] CATH LAB;  Service: Cardiovascular;  Laterality: N/A;   INSERTION OF MESH N/A 01/18/2018   Procedure: INSERTION OF MESH;  Surgeon: [name], MD;  Location: [facility] OR;  Service: General;  Laterality: N/A;   IR GASTROSTOMY TUBE MOD SED  12/27/2016   IR GASTROSTOMY TUBE REMOVAL  03/24/2018   IR PATIENT EVAL TECH 0-60 MINS  01/17/2017   MASS EXCISION Right 11/16/2016   Procedure: EXCISION RIGHT NECK LIPOMA;  Surgeon: [name], MD;  Location: [facility] OR;  Service: ENT;   Laterality: Right;   MOHS SURGERY  01/01/2015   to top of head   MOUTH SURGERY     teeth removed 4 teeth extracted 02/2010   RADICAL NECK DISSECTION Right 07/25/2017   Procedure: RIGHT NECK DISSECTION;  Surgeon: [name], MD;  Location: [facility] OR;  Service: ENT;  Laterality: Right;   UMBILICAL HERNIA REPAIR  01/18/2018   w/mesh   UMBILICAL HERNIA REPAIR N/A 01/18/2018   Procedure: UMBILICAL HERNIA REPAIR WITH MESH;  Surgeon: [name], MD;  Location: [facility] OR;  Service: General;  Laterality: N/A;    Family History  Problem Relation Age of Onset   Heart disease Mother    Sudden death Mother        under age 29   Lupus Mother    Hypertension Father    Stroke Father     SOCIAL HX: Non-smoker.  Lives with wife who is primary caregiver.   Current Outpatient Medications:    acetaminophen (TYLENOL) 500 MG tablet, Take 1,000 mg by mouth daily as needed for moderate pain. , Disp: , Rfl:    amoxicillin-clavulanate (AUGMENTIN) 875-125 MG tablet, Take 1 tablet by mouth 2 (two) times daily., Disp: 20 tablet, Rfl: 0   carvedilol (COREG) 25 MG tablet, TAKE 1 TABLET BY MOUTH TWO TIMES A DAY NEEDS APPOINTMENT, Disp: 180 tablet, Rfl: 2   Cholecalciferol (VITAMIN D3) 50 MCG (2000 UT) TABS, Take 1 tablet by mouth daily., Disp: , Rfl:    diltiazem (CARDIZEM) 90 MG tablet, TAKE ONE TABLET BY MOUTH DAILY, Disp: 90 tablet, Rfl: 3   donepezil (ARICEPT) 10 MG tablet, TAKE ONE TABLET BY MOUTH DAILY, Disp: 90 tablet, Rfl: 0   memantine (NAMENDA) 10 MG tablet, Take 1 tablet every day for 2 weeks, then increase to 1 tablet twice a day and continue, Disp: 60 tablet, Rfl: 11   PARoxetine (PAXIL) 20 MG tablet, Take 1 tablet (20 mg total) by mouth daily., Disp: 90 tablet, Rfl: 3   polyethylene glycol (MIRALAX) packet, Take 17 g by mouth daily as needed. Available OTC, Disp: 30 each, Rfl: 0   potassium chloride (KLOR-CON) 10 MEQ tablet, DISSOLVE 1 TABLET IN WATER AND ADMINISTER VIA PEG TUBE TAKE ONE TABLET  BY MOUTH DAILY, Disp: 90 tablet, Rfl: 3   rosuvastatin (CRESTOR) 10 MG tablet, Take 1 tablet (10 mg total) by mouth daily., Disp: 90 tablet, Rfl: 2   torsemide (DEMADEX) 20 MG tablet, TAKE ONE AND ONE-HALF TABLETS (30 MG) BY MOUTH DAILY, ALTERNATING WITH 1 TABLET (20 MG) EVERY THIRD DAY, Disp: 180 tablet, Rfl: 3   warfarin (COUMADIN) 5 MG tablet, Take as instructed by the Coumadin clinic., Disp: 90 tablet, Rfl: 0  EXAM:  VITALS per patient if applicable:  GENERAL: alert, oriented, appears well and in no acute distress  HEENT: atraumatic, conjunttiva clear, no obvious abnormalities on inspection of external nose and ears  NECK: normal movements of the head and neck  LUNGS:  on inspection no signs of respiratory distress, breathing rate appears normal, no obvious gross SOB, gasping or wheezing  CV: no obvious cyanosis  MS: moves all visible extremities without noticeable abnormality  PSYCH/NEURO: pleasant and cooperative, no obvious depression or anxiety, speech and thought processing grossly intact  ASSESSMENT AND PLAN:  Discussed the following assessment and plan:  Malaise  Acute non-recurrent sinusitis, unspecified location  Patient has past history of known sinus disease and odontogenic disease as above with several weeks of some malaise and nasal congestion and question of acute on chronic sinusitis  -We agreed to go ahead and cover with Augmentin 875 mg twice daily for 10 days -Recommend office evaluation if not improved with the above   I discussed the assessment and treatment plan with the patient. The patient was provided an opportunity to ask questions and all were answered. The patient agreed with the plan and demonstrated an understanding of the instructions.   The patient was advised to call back or seek an in-person evaluation if the symptoms worsen or if the condition fails to improve as anticipated.     Evelena Peat, MD

## 2021-05-25 ENCOUNTER — Other Ambulatory Visit: Payer: Self-pay

## 2021-05-25 ENCOUNTER — Ambulatory Visit (INDEPENDENT_AMBULATORY_CARE_PROVIDER_SITE_OTHER): Payer: Medicare HMO

## 2021-05-25 DIAGNOSIS — Z7901 Long term (current) use of anticoagulants: Secondary | ICD-10-CM

## 2021-05-25 LAB — POCT INR: INR: 4.3 — AB (ref 2.0–3.0)

## 2021-05-25 NOTE — Patient Instructions (Addendum)
Pre visit review using our clinic review tool, if applicable. No additional management support is needed unless otherwise documented below in the visit note.  Hold dose today and then change weekly dose to take 1 tablet daily except take 1/2 tablet on Mon, Wed, and Fri.   Recheck in 6 wks.   Call [place], RN @ [phone] when you get a date for surgery.

## 2021-05-26 NOTE — Progress Notes (Signed)
Electrophysiology Office Note Date: 05/26/2021  ID:  Collin [name], DOB Jun 18, 1941, MRN [id number]  PCP: [name], MD Primary Cardiologist: Dietrich Pates, MD Electrophysiologist: Lewayne Bunting, MD   CC: Routine ICD follow-up  Collin [name] is a 80 y.o. male seen today for Lewayne Bunting, MD for routine electrophysiology followup.  Since last being seen in our clinic the patient reports doing very well. It has been a prolonged absence. He previously had his PEG tube removed, which he still had in place at last visit.  he denies chest pain, palpitations, dyspnea, PND, orthopnea, nausea, vomiting, dizziness, syncope, edema, weight gain, or early satiety. He has not had ICD shocks.   Device History: Magazine features editor ICD implanted 2006, gen change 2015 for Chronic systolic CHF and VT   Past Medical History:  Diagnosis Date   AICD (automatic cardioverter/defibrillator) present    Atrial fibrillation (HCC)    Cancer (HCC)    tongue cancer   Cardiomyopathy, nonischemic (HCC) 1998   CHF (congestive heart failure) (HCC)    Claustrophobia    Diabetes mellitus    Dysrhythmia    Heart disease    History of echocardiogram    Echo 8/18:  EF 55-60, no RWMA, severe LAE, normal RVSF, mild RAE   History of radiation therapy 12/07/16- 01/24/17   Base of Tongue 70 Gy in 35 fractions   Hyperlipidemia    Memory loss    OSA (obstructive sleep apnea)    Shingles    Umbilical hernia    Vein disorder    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 It sometimes shows as a shadow on scans.    Past Surgical History:  Procedure Laterality Date   CARDIAC DEFIBRILLATOR PLACEMENT  2006   CATARACT EXTRACTION Bilateral    01/07/2016. 01/22/2016   CYST EXCISION     multiple drainage to a cyst in his neck.    DIRECT LARYNGOSCOPY N/A 11/16/2016   Procedure: DIRECT LARYNGOSCOPY AND BIOPSY;  Surgeon: [name], MD;  Location: [facility] OR;  Service:  ENT;  Laterality: N/A;   EYE SURGERY     HERNIA REPAIR  1950s   IMPLANTABLE CARDIOVERTER DEFIBRILLATOR (ICD) GENERATOR CHANGE N/A 05/10/2014   Procedure: ICD GENERATOR CHANGE;  Surgeon: [name], MD;  Location: [facility] CATH LAB;  Service: Cardiovascular;  Laterality: N/A;   INSERTION OF MESH N/A 01/18/2018   Procedure: INSERTION OF MESH;  Surgeon: [name], MD;  Location: [facility] OR;  Service: General;  Laterality: N/A;   IR GASTROSTOMY TUBE MOD SED  12/27/2016   IR GASTROSTOMY TUBE REMOVAL  03/24/2018   IR PATIENT EVAL TECH 0-60 MINS  01/17/2017   MASS EXCISION Right 11/16/2016   Procedure: EXCISION RIGHT NECK LIPOMA;  Surgeon: [name], MD;  Location: [facility] OR;  Service: ENT;  Laterality: Right;   MOHS SURGERY  01/01/2015   to top of head   MOUTH SURGERY     teeth removed 4 teeth extracted 02/2010   RADICAL NECK DISSECTION Right 07/25/2017   Procedure: RIGHT NECK DISSECTION;  Surgeon: [name], MD;  Location: [facility] OR;  Service: ENT;  Laterality: Right;   UMBILICAL HERNIA REPAIR  01/18/2018   w/mesh   UMBILICAL HERNIA REPAIR N/A 01/18/2018   Procedure: UMBILICAL HERNIA REPAIR WITH MESH;  Surgeon: [name], MD;  Location: [facility] OR;  Service: General;  Laterality: N/A;    Current Outpatient Medications  Medication Sig Dispense Refill   acetaminophen (TYLENOL) 500 MG tablet Take 1,000 mg by mouth daily as needed for moderate pain.      amoxicillin-clavulanate (AUGMENTIN) 875-125 MG tablet Take 1 tablet by mouth 2 (two) times daily. 20 tablet 0   carvedilol (COREG) 25 MG tablet TAKE 1 TABLET BY MOUTH TWO TIMES A DAY NEEDS APPOINTMENT 180 tablet 2   Cholecalciferol (VITAMIN D3) 50 MCG (2000 UT) TABS Take 1 tablet by mouth daily.     diltiazem (CARDIZEM) 90 MG tablet TAKE ONE TABLET BY MOUTH DAILY 90 tablet 3   donepezil (ARICEPT) 10 MG tablet TAKE ONE TABLET BY MOUTH DAILY 90 tablet 0   memantine (NAMENDA) 10 MG tablet Take 1 tablet every day for 2 weeks, then increase to  1 tablet twice a day and continue 60 tablet 11   PARoxetine (PAXIL) 20 MG tablet Take 1 tablet (20 mg total) by mouth daily. 90 tablet 3   polyethylene glycol (MIRALAX) packet Take 17 g by mouth daily as needed. Available OTC 30 each 0   potassium chloride (KLOR-CON) 10 MEQ tablet DISSOLVE 1 TABLET IN WATER AND ADMINISTER VIA PEG TUBE TAKE ONE TABLET BY MOUTH DAILY 90 tablet 3   rosuvastatin (CRESTOR) 10 MG tablet Take 1 tablet (10 mg total) by mouth daily. 90 tablet 2   torsemide (DEMADEX) 20 MG tablet TAKE ONE AND ONE-HALF TABLETS (30 MG) BY MOUTH DAILY, ALTERNATING WITH 1 TABLET (20 MG) EVERY THIRD DAY 180 tablet 3   warfarin (COUMADIN) 5 MG tablet Take as instructed by the Coumadin clinic. 90 tablet 0   No current facility-administered medications for this visit.    Allergies:   Niaspan [niacin er]   Social History: Social History   Socioeconomic History   Marital status: Married    Spouse name: [name]   Number of children: 3   Years of education: Not on file   Highest education level: Not on file  Occupational History   Occupation: Retired Engineer, materials  Tobacco Use   Smoking status: Never   Smokeless tobacco: Never  Vaping Use   Vaping Use: Never used  Substance and Sexual Activity   Alcohol use: Yes    Comment: "a little wine every once in a while" 01/17/2018   Drug use: No   Sexual activity: Not on file  Other Topics Concern   Not on file  Social History Narrative   Pt lives in split level home with his wife, Aggie Cosier.   Has 3 sons   9th grade education   Retired Electrical engineer   Right handed   Social Determinants of Health   Financial Resource Strain: Not on file  Food Insecurity: Not on file  Transportation Needs: Not on file  Physical Activity: Not on file  Stress: Not on file  Social Connections: Not on file  Intimate Partner Violence: Not on file    Family History: Family History  Problem Relation Age of Onset   Heart disease Mother    Sudden  death Mother        under age 80   Lupus Mother    Hypertension Father    Stroke Father     Review of Systems: All other systems reviewed and are otherwise negative except as noted above.   Physical Exam: There were no vitals filed for this visit.   GEN- The patient is well appearing, alert and oriented x 3 today.   HEENT: normocephalic, atraumatic; sclera clear, conjunctiva pink; hearing intact;  oropharynx clear; neck supple, no JVP Lymph- no cervical lymphadenopathy Lungs- Clear to ausculation bilaterally, normal work of breathing.  No wheezes, rales, rhonchi Heart- Regular rate and rhythm, no murmurs, rubs or gallops, PMI not laterally displaced GI- soft, non-tender, non-distended, bowel sounds present, no hepatosplenomegaly Extremities- no clubbing or cyanosis. No edema; DP/PT/radial pulses 2+ bilaterally MS- no significant deformity or atrophy Skin- warm and dry, no rash or lesion; ICD pocket well healed Psych- euthymic mood, full affect Neuro- strength and sensation are intact  ICD interrogation- reviewed in detail today,  See PACEART report  EKG:  EKG is not ordered today.  Recent Labs: 10/10/2020: TSH 2.490 02/20/2021: ALT 13; BUN 16; Creatinine, Ser 0.94; Hemoglobin 12.4; Magnesium 2.2; Platelets 187; Potassium 3.8; Sodium 139   Wt Readings from Last 3 Encounters:  04/17/21 217 lb 4.8 oz (98.6 kg)  03/06/21 211 lb (95.7 kg)  03/02/21 209 lb 14.4 oz (95.2 kg)     Other studies Reviewed: Additional studies/ records that were reviewed today include: Previous EP office notes.   Assessment and Plan:  1.  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 Atrial fibrillation, Persistent. Rates controlled On coumadin for CHA2DS2/VASc of at least 5.    3. VT Occasional NSVT. No sustained Continue BB. Recent labs stable.    4. Tongue CA S/p radiation and surgery  Current medicines are reviewed at length with the patient today.    Disposition:   Follow up with Dr. [name] in 12 months. Sooner with issues.   Dustin Flock, PA-C  05/26/2021 2:49 PM  [facility] [address] [place] [address] [phone] (office) [phone] (fax)

## 2021-05-27 ENCOUNTER — Other Ambulatory Visit: Payer: Self-pay

## 2021-05-27 ENCOUNTER — Ambulatory Visit: Payer: Medicare HMO | Admitting: Student

## 2021-05-27 ENCOUNTER — Ambulatory Visit: Payer: Medicare HMO | Admitting: Neurology

## 2021-05-27 ENCOUNTER — Encounter: Payer: Self-pay | Admitting: Student

## 2021-05-27 VITALS — BP 90/60 | HR 77 | Ht 73.0 in | Wt 213.4 lb

## 2021-05-27 DIAGNOSIS — I472 Ventricular tachycardia, unspecified: Secondary | ICD-10-CM

## 2021-05-27 DIAGNOSIS — I428 Other cardiomyopathies: Secondary | ICD-10-CM | POA: Diagnosis not present

## 2021-05-27 DIAGNOSIS — I480 Paroxysmal atrial fibrillation: Secondary | ICD-10-CM

## 2021-05-27 LAB — CUP PACEART INCLINIC DEVICE CHECK
Date Time Interrogation Session: 20220921105917
HighPow Impedance: 41 Ohm
Implantable Lead Implant Date: 20060209
Implantable Lead Location: 753860
Implantable Lead Model: 158
Implantable Lead Serial Number: 156422
Implantable Pulse Generator Implant Date: 20150904
Lead Channel Impedance Value: 371 Ohm
Lead Channel Pacing Threshold Amplitude: 1.1 V
Lead Channel Pacing Threshold Pulse Width: 0.6 ms
Lead Channel Sensing Intrinsic Amplitude: 13.2 mV
Lead Channel Setting Pacing Amplitude: 2.4 V
Lead Channel Setting Pacing Pulse Width: 0.6 ms
Lead Channel Setting Sensing Sensitivity: 0.5 mV
Pulse Gen Serial Number: 191056

## 2021-05-27 NOTE — Patient Instructions (Signed)
Medication Instructions:  Your physician recommends that you continue on your current medications as directed. Please refer to the Current Medication list given to you today.  *If you need a refill on your cardiac medications before your next appointment, please call your pharmacy*   Lab Work: None If you have labs (blood work) drawn today and your tests are completely normal, you will receive your results only by: MyChart Message (if you have MyChart) OR A paper copy in the mail If you have any lab test that is abnormal or we need to change your treatment, we will call you to review the results.   Follow-Up: At [facility], you and your health needs are our priority.  As part of our continuing mission to provide you with exceptional heart care, we have created designated Provider Care Teams.  These Care Teams include your primary Cardiologist (physician) and Advanced Practice Providers (APPs -  Physician Assistants and Nurse Practitioners) who all work together to provide you with the care you need, when you need it.  Your next appointment:   1 year(s)  The format for your next appointment:   In Person  Provider:   You may see Gregg Taylor, MD or one of the following Advanced Practice Providers on your designated Care Team:   Renee Ursuy, PA-C Michael "Andy" Tillery, PA-C   

## 2021-06-08 ENCOUNTER — Other Ambulatory Visit: Payer: Self-pay

## 2021-06-08 ENCOUNTER — Ambulatory Visit (INDEPENDENT_AMBULATORY_CARE_PROVIDER_SITE_OTHER): Payer: Medicare HMO

## 2021-06-08 DIAGNOSIS — Z7901 Long term (current) use of anticoagulants: Secondary | ICD-10-CM | POA: Diagnosis not present

## 2021-06-08 LAB — POCT INR: INR: 3.6 — AB (ref 2.0–3.0)

## 2021-06-08 NOTE — Patient Instructions (Addendum)
Pre visit review using our clinic review tool, if applicable. No additional management support is needed unless otherwise documented below in the visit note.  Hold dose today and then change weekly dose to take 1/2 tablet daily except take 1 tablet on Mon, Wed, and Fri.   Recheck in 3 wks.   Call Platter, RN @ [phone] when you get a date for surgery.

## 2021-06-15 ENCOUNTER — Telehealth: Payer: Self-pay | Admitting: *Deleted

## 2021-06-15 ENCOUNTER — Emergency Department (HOSPITAL_COMMUNITY): Payer: Medicare HMO

## 2021-06-15 ENCOUNTER — Emergency Department (HOSPITAL_COMMUNITY)
Admission: EM | Admit: 2021-06-15 | Discharge: 2021-06-15 | Disposition: A | Discharge status: Home / Self Care | Payer: Medicare HMO | Attending: Emergency Medicine | Admitting: Emergency Medicine

## 2021-06-15 DIAGNOSIS — S0101XA Laceration without foreign body of scalp, initial encounter: Secondary | ICD-10-CM

## 2021-06-15 DIAGNOSIS — S8001XA Contusion of right knee, initial encounter: Secondary | ICD-10-CM

## 2021-06-15 DIAGNOSIS — W19XXXA Unspecified fall, initial encounter: Secondary | ICD-10-CM

## 2021-06-15 DIAGNOSIS — S40011A Contusion of right shoulder, initial encounter: Secondary | ICD-10-CM

## 2021-06-15 DIAGNOSIS — Z23 Encounter for immunization: Secondary | ICD-10-CM | POA: Insufficient documentation

## 2021-06-15 DIAGNOSIS — I509 Heart failure, unspecified: Secondary | ICD-10-CM | POA: Diagnosis not present

## 2021-06-15 DIAGNOSIS — R0789 Other chest pain: Secondary | ICD-10-CM | POA: Diagnosis not present

## 2021-06-15 DIAGNOSIS — I2510 Atherosclerotic heart disease of native coronary artery without angina pectoris: Secondary | ICD-10-CM | POA: Diagnosis not present

## 2021-06-15 DIAGNOSIS — S199XXA Unspecified injury of neck, initial encounter: Secondary | ICD-10-CM | POA: Diagnosis not present

## 2021-06-15 DIAGNOSIS — W01198A Fall on same level from slipping, tripping and stumbling with subsequent striking against other object, initial encounter: Secondary | ICD-10-CM | POA: Insufficient documentation

## 2021-06-15 DIAGNOSIS — R58 Hemorrhage, not elsewhere classified: Secondary | ICD-10-CM | POA: Diagnosis not present

## 2021-06-15 DIAGNOSIS — S0181XA Laceration without foreign body of other part of head, initial encounter: Secondary | ICD-10-CM | POA: Insufficient documentation

## 2021-06-15 DIAGNOSIS — M25511 Pain in right shoulder: Secondary | ICD-10-CM | POA: Insufficient documentation

## 2021-06-15 DIAGNOSIS — S0990XA Unspecified injury of head, initial encounter: Secondary | ICD-10-CM | POA: Diagnosis not present

## 2021-06-15 DIAGNOSIS — R55 Syncope and collapse: Secondary | ICD-10-CM | POA: Diagnosis not present

## 2021-06-15 DIAGNOSIS — Z743 Need for continuous supervision: Secondary | ICD-10-CM | POA: Diagnosis not present

## 2021-06-15 DIAGNOSIS — R109 Unspecified abdominal pain: Secondary | ICD-10-CM | POA: Insufficient documentation

## 2021-06-15 DIAGNOSIS — S0993XA Unspecified injury of face, initial encounter: Secondary | ICD-10-CM | POA: Diagnosis not present

## 2021-06-15 DIAGNOSIS — R079 Chest pain, unspecified: Secondary | ICD-10-CM | POA: Diagnosis not present

## 2021-06-15 DIAGNOSIS — D3500 Benign neoplasm of unspecified adrenal gland: Secondary | ICD-10-CM | POA: Diagnosis not present

## 2021-06-15 DIAGNOSIS — Z043 Encounter for examination and observation following other accident: Secondary | ICD-10-CM | POA: Diagnosis not present

## 2021-06-15 DIAGNOSIS — M549 Dorsalgia, unspecified: Secondary | ICD-10-CM | POA: Diagnosis not present

## 2021-06-15 DIAGNOSIS — S0003XA Contusion of scalp, initial encounter: Secondary | ICD-10-CM | POA: Diagnosis not present

## 2021-06-15 DIAGNOSIS — I700 Atherosclerosis of aorta: Secondary | ICD-10-CM | POA: Diagnosis not present

## 2021-06-15 DIAGNOSIS — K8020 Calculus of gallbladder without cholecystitis without obstruction: Secondary | ICD-10-CM | POA: Diagnosis not present

## 2021-06-15 LAB — COMPREHENSIVE METABOLIC PANEL
ALT: 22 U/L (ref 0–44)
AST: 24 U/L (ref 15–41)
Albumin: 3.6 g/dL (ref 3.5–5.0)
Alkaline Phosphatase: 102 U/L (ref 38–126)
Anion gap: 9 (ref 5–15)
BUN: 20 mg/dL (ref 8–23)
CO2: 26 mmol/L (ref 22–32)
Calcium: 9.2 mg/dL (ref 8.9–10.3)
Chloride: 103 mmol/L (ref 98–111)
Creatinine, Ser: 1.17 mg/dL (ref 0.61–1.24)
GFR, Estimated: >60 mL/min (ref >60)
Glucose, Bld: 99 mg/dL (ref 70–99)
Potassium: 4.3 mmol/L (ref 3.5–5.1)
Sodium: 138 mmol/L (ref 135–145)
Total Bilirubin: 0.6 mg/dL (ref 0.3–1.2)
Total Protein: 7.2 g/dL (ref 6.5–8.1)

## 2021-06-15 LAB — CBC
HCT: 41.6 % (ref 39.0–52.0)
Hemoglobin: 13.3 g/dL (ref 13.0–17.0)
MCH: 30.5 pg (ref 26.0–34.0)
MCHC: 32 g/dL (ref 30.0–36.0)
MCV: 95.4 fL (ref 80.0–100.0)
Platelets: 198 10*3/uL (ref 150–400)
RBC: 4.36 MIL/uL (ref 4.22–5.81)
RDW: 14.5 % (ref 11.5–15.5)
WBC: 4.5 10*3/uL (ref 4.0–10.5)
nRBC: 0 % (ref 0.0–0.2)

## 2021-06-15 LAB — PROTIME-INR
INR: 2.4 — ABNORMAL HIGH (ref 0.8–1.2)
Prothrombin Time: 26.4 seconds — ABNORMAL HIGH (ref 11.4–15.2)

## 2021-06-15 LAB — I-STAT CHEM 8, ED
BUN: 24 mg/dL — ABNORMAL HIGH (ref 8–23)
Calcium, Ion: 1.1 mmol/L — ABNORMAL LOW (ref 1.15–1.40)
Chloride: 102 mmol/L (ref 98–111)
Creatinine, Ser: 1.1 mg/dL (ref 0.61–1.24)
Glucose, Bld: 99 mg/dL (ref 70–99)
HCT: 42 % (ref 39.0–52.0)
Hemoglobin: 14.3 g/dL (ref 13.0–17.0)
Potassium: 4.3 mmol/L (ref 3.5–5.1)
Sodium: 138 mmol/L (ref 135–145)
TCO2: 27 mmol/L (ref 22–32)

## 2021-06-15 LAB — SAMPLE TO BLOOD BANK

## 2021-06-15 LAB — ETHANOL: Alcohol, Ethyl (B): <10 mg/dL (ref <10)

## 2021-06-15 MED ORDER — TETANUS-DIPHTH-ACELL PERTUSSIS 5-2.5-18.5 LF-MCG/0.5 IM SUSY
0.5000 mL | PREFILLED_SYRINGE | Freq: Once | INTRAMUSCULAR | Status: AC
  Administered 2021-06-15: 0.5 mL via INTRAMUSCULAR
  Filled 2021-06-15: qty 0.5

## 2021-06-15 MED ORDER — SODIUM CHLORIDE 0.9 % IV BOLUS
1000.0000 mL | Freq: Once | INTRAVENOUS | Status: DC
  Administered 2021-06-15: 1000 mL via INTRAVENOUS

## 2021-06-15 MED ORDER — LIDOCAINE-EPINEPHRINE 2 %-1:100000 IJ SOLN
20.0000 mL | Freq: Once | INTRAMUSCULAR | Status: AC
  Administered 2021-06-15: 20 mL
  Filled 2021-06-15: qty 20

## 2021-06-15 MED ORDER — IOHEXOL 350 MG/ML SOLN
75.0000 mL | Freq: Once | INTRAVENOUS | Status: AC | PRN
  Administered 2021-06-15: 75 mL via INTRAVENOUS

## 2021-06-15 MED ORDER — LIDOCAINE-EPINEPHRINE (PF) 2 %-1:200000 IJ SOLN
INTRAMUSCULAR | Status: AC
  Filled 2021-06-15: qty 20

## 2021-06-15 MED ORDER — ONDANSETRON HCL 4 MG/2ML IJ SOLN
4.0000 mg | Freq: Once | INTRAMUSCULAR | Status: AC
  Administered 2021-06-15: 4 mg via INTRAVENOUS
  Filled 2021-06-15: qty 2

## 2021-06-15 MED ORDER — MORPHINE SULFATE (PF) 4 MG/ML IV SOLN
4.0000 mg | Freq: Once | INTRAVENOUS | Status: AC
Start: 2021-06-15 — End: 2021-06-15
  Administered 2021-06-15: 4 mg via INTRAVENOUS
  Filled 2021-06-15: qty 1

## 2021-06-15 NOTE — Discharge Instructions (Addendum)
Your x-rays did not show any fractures.  There is no internal bleeding on your CT scans  Take Tylenol as needed for pain.  See your doctor for follow-up  Expect to be stiff and sore for several days.  Also expect to have bruising on the right side of the face.  Suture removal in a week  Return to ER if you have worse headache, abdominal pain, chest pain, trouble breathing, difficulty walking

## 2021-06-15 NOTE — ED Notes (Signed)
Patient transported to CT 

## 2021-06-15 NOTE — ED Notes (Signed)
Trauma Response Nurse Note-  Reason for Call / Reason for Trauma activation:   - L2 fall on warfarin  Initial Focused Assessment (If applicable, or please see trauma documentation):  - Lac to R forehead - GCS 14 (has mild alzheimers)   Interventions:  - L PIV 20G  - trauma labs - Xrays of chest, pelvis, shoulder and knee - CT pan scan  Plan of Care as of this note:  - Awaiting scan results.  Event Summary:   - Pt was bending over to pet a dog and fell hitting his head and multiple areas on his body. On warfarin for [place], [place] .

## 2021-06-15 NOTE — ED Provider Notes (Signed)
..  Laceration Repair  Date/Time: 06/15/2021 8:05 PM Performed by: Jacqlyn Larsen, PA-C Authorized by: Jacqlyn Larsen, PA-C   Consent:    Consent obtained:  Verbal   Consent given by:  Patient   Risks discussed:  Infection, pain, poor cosmetic result, need for additional repair, nerve damage and poor wound healing   Alternatives discussed:  No treatment Universal protocol:    Procedure explained and questions answered to patient or proxy's satisfaction: yes     Patient identity confirmed:  Verbally with patient Anesthesia:    Anesthesia method:  Local infiltration   Local anesthetic:  Lidocaine 2% WITH epi Laceration details:    Location:  Face   Face location:  Forehead   Length (cm):  3   Depth (mm):  7 Pre-procedure details:    Preparation:  Patient was prepped and draped in usual sterile fashion Exploration:    Hemostasis achieved with:  Direct pressure and epinephrine   Imaging outcome: foreign body not noted     Wound exploration: entire depth of wound visualized     Wound extent: areolar tissue violated     Wound extent: no foreign bodies/material noted and no underlying fracture noted   Treatment:    Area cleansed with:  Saline   Amount of cleaning:  Standard   Irrigation solution:  Sterile saline   Undermining:  None Skin repair:    Repair method:  Sutures   Suture size:  5-0   Suture material:  Prolene   Suture technique:  Simple interrupted   Number of sutures:  5 Approximation:    Approximation:  Close Repair type:    Repair type:  Simple Post-procedure details:    Dressing:  Non-adherent dressing   Procedure completion:  Tolerated well, no immediate complications    Jacqlyn Larsen, PA-C 06/15/21 2007    Drenda Freeze, MD 06/15/21 2027

## 2021-06-15 NOTE — ED Provider Notes (Signed)
[name] Provider Note   CSN: [id number] Arrival date & time: 06/15/21  1754     History No chief complaint on file.   [name] is a 80 y.o. male history of CHF with ICD, A. fib on Coumadin, V. tach, here presenting with fall.  Patient states that he was standing and bent over and fell and hit his head.  He states that he also hit his chest and abdomen as well.  He is complaining of right shoulder pain and right-sided abdominal pain and right knee pain.  Patient was noted to have laceration of the right forehead.  Level 2 trauma was activated by EMS since he is on Coumadin  The history is provided by the patient.      No past medical history on file.  There are no problems to display for this patient.     No family history on file.     Home Medications Prior to Admission medications   Not on File    Allergies    Patient has no allergy information on record.  Review of Systems   Review of Systems  Skin:  Positive for wound.  All other systems reviewed and are negative.  Physical Exam Updated Vital Signs BP 134/84   Pulse 72   Temp (!) 96.8 F (36 C) (Temporal)   Resp 17   Ht 6\' 1"  (1.854 m)   Wt 99.8 kg   SpO2 99%   BMI 29.03 kg/m   Physical Exam Vitals and nursing note reviewed.  Constitutional:      Comments: Uncomfortable and chronically ill  HENT:     Head: Normocephalic.     Comments: + 3 cm laceration on the right forehead    Nose: Nose normal.     Mouth/Throat:     Mouth: Mucous membranes are moist.  Eyes:     Extraocular Movements: Extraocular movements intact.     Pupils: Pupils are equal, round, and reactive to light.  Cardiovascular:     Rate and Rhythm: Normal rate and regular rhythm.     Pulses: Normal pulses.     Heart sounds: Normal heart sounds.  Pulmonary:     Effort: Pulmonary effort is normal.     Breath sounds: Normal breath sounds.  Abdominal:     General: Abdomen is  flat.     Comments: Tenderness in the right upper quadrant.  No obvious ecchymosis  Musculoskeletal:        General: Normal range of motion.     Cervical back: Normal range of motion and neck supple.     Comments: No spinal tenderness. Bruising R knee and R shoulder   Skin:    General: Skin is warm.     Capillary Refill: Capillary refill takes less than 2 seconds.  Neurological:     General: No focal deficit present.     Mental Status: He is alert and oriented to person, place, and time.  Psychiatric:        Mood and Affect: Mood normal.        Behavior: Behavior normal.    ED Results / Procedures / Treatments   Labs (all labs ordered are listed, but only abnormal results are displayed) Labs Reviewed  PROTIME-INR - Abnormal; Notable for the following components:      Result Value   Prothrombin Time 26.4 (*)    INR 2.4 (*)    All other components within normal limits  I-STAT  CHEM 8, ED - Abnormal; Notable for the following components:   BUN 24 (*)    Calcium, Ion 1.10 (*)    All other components within normal limits  RESP PANEL BY RT-PCR (FLU A&B, COVID) [id number]  COMPREHENSIVE METABOLIC PANEL  CBC  ETHANOL  URINALYSIS, ROUTINE W REFLEX MICROSCOPIC  SAMPLE TO BLOOD BANK    EKG None  Radiology DG Chest 1 View  Result Date: 06/15/2021 CLINICAL DATA:  [place] EXAM: CHEST  1 VIEW COMPARISON:  02/19/2021 FINDINGS: Single frontal view of the chest demonstrates stable AICD. Cardiac silhouette remains enlarged. No acute airspace disease, effusion, or pneumothorax. Likely aspirated barium right lung base. Banal acute displaced fracture. IMPRESSION: 1. Stable chest, no acute process. Electronically Signed   By: Randa Ngo M.D.   On: 06/15/2021 18:28   DG Pelvis 1-2 Views  Result Date: 06/15/2021 CLINICAL DATA:  [place] EXAM: PELVIS - 1-2 VIEW COMPARISON:  None. FINDINGS: 2 supine frontal views of the pelvis are obtained. No acute fracture, subluxation, or dislocation within either  hip. 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 The remainder of the bony pelvis is unremarkable. Sacroiliac joints are symmetrical. Mild lower lumbar spondylosis. IMPRESSION: 1. Degenerative changes as above.  No acute fracture. Electronically Signed   By: Randa Ngo M.D.   On: 06/15/2021 18:29   DG Shoulder Right  Result Date: 06/15/2021 CLINICAL DATA:  [place] EXAM: RIGHT SHOULDER - 2+ VIEW COMPARISON:  None. FINDINGS: Internal rotation, external rotation, and transscapular views of the right shoulder are obtained. No acute fracture, subluxation, or dislocation. There is moderate acromioclavicular joint osteoarthritis. Mild glenohumeral joint space narrowing. Mild narrowing of the acromial humeral interval, with prominent spurring of the undersurface of the acromion process. Right chest is clear. IMPRESSION: 1. Degenerative changes.  No acute fracture. Electronically Signed   By: Randa Ngo M.D.   On: 06/15/2021 18:30   CT HEAD WO CONTRAST  Result Date: 06/15/2021 CLINICAL DATA:  Facial trauma. Level 2 trauma. Fall on blood thinners. EXAM: CT HEAD WITHOUT CONTRAST CT CERVICAL SPINE WITHOUT CONTRAST TECHNIQUE: Multidetector CT imaging of the head and cervical spine was performed following the standard protocol without intravenous contrast. Multiplanar CT image reconstructions of the cervical spine were also generated. COMPARISON:  CT head 03/14/2018.  CT neck 02/19/2021 FINDINGS: CT HEAD FINDINGS Brain: Diffuse cerebral atrophy. Ventricular dilatation consistent with central atrophy. Low-attenuation changes in the deep white matter consistent with small vessel ischemia. No abnormal extra-axial fluid collections. No mass effect or midline shift. Gray-white matter junctions are distinct. Basal cisterns are not effaced. No acute intracranial hemorrhage. Vascular: Moderate intracranial arterial vascular calcifications. Mild prominence of the basilar tip likely representing tortuosity and  unchanged since prior study. Skull: Calvarium appears intact. Subcutaneous scalp hematoma over the right anterior frontal region. Sinuses/Orbits: Right periorbital soft tissue hematoma. Mucosal thickening in the paranasal sinuses. No acute air-fluid levels. Mastoid air cells are clear. Other: None. CT CERVICAL SPINE FINDINGS Alignment: Normal alignment of the cervical vertebrae and facet joints. C1-2 articulation appears intact. Skull base and vertebrae: Skull base appears intact. Congenital nonunion of the posterior arch of C1. No vertebral compression deformities. No focal bone lesion or bone destruction. Bone cortex appears intact. Soft tissues and spinal canal: No prevertebral soft tissue swelling. No abnormal paraspinal soft tissue mass or infiltration. Disc levels: Degenerative changes with disc space narrowing and endplate osteophyte formation throughout. Most prominent degenerative changes at C4-5, C5-6, and C6-7. Degenerative changes in the facet joints. Upper chest: Lung  apices are clear. Other: None. IMPRESSION: 1. No acute intracranial abnormalities. Mild chronic atrophy and small vessel ischemic changes. 2. Normal alignment of the cervical spine. Diffuse degenerative change. No acute displaced fractures identified. 3. Right periorbital and supraorbital subcutaneous hematoma. Electronically Signed   By: Lucienne Capers M.D.   On: 06/15/2021 19:39   CT CHEST W CONTRAST  Result Date: 06/15/2021 CLINICAL DATA:  Fall, pain EXAM: CT CHEST, ABDOMEN, AND PELVIS WITH CONTRAST TECHNIQUE: Multidetector CT imaging of the chest, abdomen and pelvis was performed following the standard protocol during bolus administration of intravenous contrast. CONTRAST:  3mL OMNIPAQUE IOHEXOL 350 MG/ML SOLN COMPARISON:  CT abdomen pelvis, 11/12/2017 FINDINGS: CT CHEST FINDINGS Cardiovascular: Aortic atherosclerosis. Left chest multi lead pacer defibrillator. Cardiomegaly. Three-vessel coronary artery calcifications and/or  stents. No pericardial effusion. Mediastinum/Nodes: No enlarged mediastinal, hilar, or axillary lymph nodes. Thyroid gland, trachea, and esophagus demonstrate no significant findings. Lungs/Pleura: Mild, diffuse bilateral bronchial wall thickening. Minimal bibasilar scarring. No pleural effusion or pneumothorax. Musculoskeletal: No chest wall mass or suspicious bone lesions identified. Bilateral gynecomastia. CT ABDOMEN PELVIS FINDINGS Hepatobiliary: No solid liver abnormality is seen. Large, rim calcified gallstone in the gallbladder. Gallbladder wall thickening, or biliary dilatation. Pancreas: Unremarkable. No pancreatic ductal dilatation or surrounding inflammatory changes. Spleen: Normal in size without significant abnormality. Adrenals/Urinary Tract: Benign, adenomatous thickening of the bilateral adrenal glands. Kidneys are normal, without renal calculi, solid lesion, or hydronephrosis. Bladder is unremarkable. Stomach/Bowel: Stomach is within normal limits. Appendix is not clearly visualized. No evidence of bowel wall thickening, distention, or inflammatory changes. Vascular/Lymphatic: Aortic atherosclerosis. 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 No enlarged abdominal or pelvic lymph nodes. Reproductive: Prostatomegaly. Other: No abdominal wall hernia or abnormality. No abdominopelvic ascites. Musculoskeletal: No acute or significant osseous findings. IMPRESSION: 1. No CT evidence of acute traumatic injury to the chest, abdomen, or pelvis. 2. Cardiomegaly and coronary artery disease. 3. Cholelithiasis. 4. Prostatomegaly. Aortic Atherosclerosis (ICD10-I70.0). Electronically Signed   By: Delanna Ahmadi M.D.   On: 06/15/2021 19:38   CT CERVICAL SPINE WO CONTRAST  Result Date: 06/15/2021 CLINICAL DATA:  Facial trauma. Level 2 trauma. Fall on blood thinners. EXAM: CT HEAD WITHOUT CONTRAST CT CERVICAL SPINE WITHOUT  CONTRAST TECHNIQUE: Multidetector CT imaging of the head and cervical spine was performed following the standard protocol without intravenous contrast. Multiplanar CT image reconstructions of the cervical spine were also generated. COMPARISON:  CT head 03/14/2018.  CT neck 02/19/2021 FINDINGS: CT HEAD FINDINGS Brain: Diffuse cerebral atrophy. Ventricular dilatation consistent with central atrophy. Low-attenuation changes in the deep white matter consistent with small vessel ischemia. No abnormal extra-axial fluid collections. No mass effect or midline shift. Gray-white matter junctions are distinct. Basal cisterns are not effaced. No acute intracranial hemorrhage. Vascular: Moderate intracranial arterial vascular calcifications. Mild prominence of the basilar tip likely representing tortuosity and unchanged since prior study. Skull: Calvarium appears intact. Subcutaneous scalp hematoma over the right anterior frontal region. Sinuses/Orbits: Right periorbital soft tissue hematoma. Mucosal thickening in the paranasal sinuses. No acute air-fluid levels. Mastoid air cells are clear. Other: None. CT CERVICAL SPINE FINDINGS Alignment: Normal alignment of the cervical vertebrae and facet joints. C1-2 articulation appears intact. Skull base and vertebrae: Skull base appears intact. Congenital nonunion of the posterior arch of C1. No vertebral compression deformities. No focal bone lesion or bone destruction. Bone cortex appears intact. Soft tissues and spinal canal: No prevertebral soft tissue swelling. No abnormal paraspinal soft tissue  mass or infiltration. Disc levels: Degenerative changes with disc space narrowing and endplate osteophyte formation throughout. Most prominent degenerative changes at C4-5, C5-6, and C6-7. Degenerative changes in the facet joints. Upper chest: Lung apices are clear. Other: None. IMPRESSION: 1. No acute intracranial abnormalities. Mild chronic atrophy and small vessel ischemic changes. 2.  Normal alignment of the cervical spine. Diffuse degenerative change. No acute displaced fractures identified. 3. Right periorbital and supraorbital subcutaneous hematoma. Electronically Signed   By: Lucienne Capers M.D.   On: 06/15/2021 19:39   CT ABDOMEN PELVIS W CONTRAST  Result Date: 06/15/2021 CLINICAL DATA:  Fall, pain EXAM: CT CHEST, ABDOMEN, AND PELVIS WITH CONTRAST TECHNIQUE: Multidetector CT imaging of the chest, abdomen and pelvis was performed following the standard protocol during bolus administration of intravenous contrast. CONTRAST:  5mL OMNIPAQUE IOHEXOL 350 MG/ML SOLN COMPARISON:  CT abdomen pelvis, 11/12/2017 FINDINGS: CT CHEST FINDINGS Cardiovascular: Aortic atherosclerosis. Left chest multi lead pacer defibrillator. Cardiomegaly. Three-vessel coronary artery calcifications and/or stents. No pericardial effusion. Mediastinum/Nodes: No enlarged mediastinal, hilar, or axillary lymph nodes. Thyroid gland, trachea, and esophagus demonstrate no significant findings. Lungs/Pleura: Mild, diffuse bilateral bronchial wall thickening. Minimal bibasilar scarring. No pleural effusion or pneumothorax. Musculoskeletal: No chest wall mass or suspicious bone lesions identified. Bilateral gynecomastia. CT ABDOMEN PELVIS FINDINGS Hepatobiliary: No solid liver abnormality is seen. Large, rim calcified gallstone in the gallbladder. Gallbladder wall thickening, or biliary dilatation. Pancreas: Unremarkable. No pancreatic ductal dilatation or surrounding inflammatory changes. Spleen: Normal in size without significant abnormality. Adrenals/Urinary Tract: Benign, adenomatous thickening of the bilateral adrenal glands. Kidneys are normal, without renal calculi, solid lesion, or hydronephrosis. Bladder is unremarkable. Stomach/Bowel: Stomach is within normal limits. Appendix is not clearly visualized. No evidence of bowel wall thickening, distention, or inflammatory changes. Vascular/Lymphatic: Aortic  atherosclerosis. 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 No enlarged abdominal or pelvic lymph nodes. Reproductive: Prostatomegaly. Other: No abdominal wall hernia or abnormality. No abdominopelvic ascites. Musculoskeletal: No acute or significant osseous findings. IMPRESSION: 1. No CT evidence of acute traumatic injury to the chest, abdomen, or pelvis. 2. Cardiomegaly and coronary artery disease. 3. Cholelithiasis. 4. Prostatomegaly. Aortic Atherosclerosis (ICD10-I70.0). Electronically Signed   By: Delanna Ahmadi M.D.   On: 06/15/2021 19:38   DG Knee Complete 4 Views Right  Result Date: 06/15/2021 CLINICAL DATA:  [place] EXAM: RIGHT KNEE - COMPLETE 4+ VIEW COMPARISON:  None. FINDINGS: Frontal, bilateral oblique, and lateral views of the right knee are obtained. No fracture, subluxation, or dislocation. Three compartmental osteoarthritis greatest in the medial compartment. No joint effusion. Diffuse atherosclerosis. Soft tissues are unremarkable. IMPRESSION: 1. Moderate 3 compartmental osteoarthritis greatest medially. 2. No acute bony abnormality. Electronically Signed   By: Randa Ngo M.D.   On: 06/15/2021 18:30   CT MAXILLOFACIAL WO CONTRAST  Result Date: 06/15/2021 CLINICAL DATA:  Fall, facial trauma EXAM: CT MAXILLOFACIAL WITHOUT CONTRAST TECHNIQUE: Multidetector CT imaging of the maxillofacial structures was performed. Multiplanar CT image reconstructions were also generated. COMPARISON:  02/19/2021 FINDINGS: Osseous: No fracture or mandibular dislocation. No destructive process. Orbits: Negative. No traumatic or inflammatory finding. Sinuses: Clear. Soft tissues: Soft tissue contusion and laceration of the right forehead (series 3, image 79). Limited intracranial: No significant or unexpected finding. IMPRESSION: 1. No displaced fracture or dislocation of the facial bones. 2. Soft tissue contusion and laceration  of the right forehead. Electronically Signed   By: Delanna Ahmadi M.D.   On: 06/15/2021 19:41  Procedures Procedures   Medications Ordered in ED Medications  Tdap (BOOSTRIX) injection 0.5 mL (0.5 mLs Intramuscular Given 06/15/21 1936)  morphine 4 MG/ML injection 4 mg (4 mg Intravenous Given 06/15/21 1935)  ondansetron (ZOFRAN) injection 4 mg (4 mg Intravenous Given 06/15/21 1936)  lidocaine-EPINEPHrine (XYLOCAINE W/EPI) 2 %-1:100000 (with pres) injection 20 mL (20 mLs Infiltration Given 06/15/21 1938)  iohexol (OMNIPAQUE) 350 MG/ML injection 75 mL (75 mLs Intravenous Contrast Given 06/15/21 1854)  lidocaine-EPINEPHrine (XYLOCAINE W/EPI) 2 %-1:200000 (PF) injection (  Return to [facility] 06/15/21 1945)    ED Course  I have reviewed the triage vital signs and the nursing notes.  Pertinent labs & imaging results that were available during my care of the patient were reviewed by me and considered in my medical decision making (see chart for details).    MDM Rules/Calculators/A&P                           [name] is a 80 y.o. male here with fall. Patient is on coumadin. Will get trauma scan and xrays.   8:26 PM Patient's INR is 2.5.  Trauma scan did not show any fracture or bleeding.  Patient has some right orbital hematoma with no fracture.  X-ray did not show any fractures.  Sutures placed by PA.  Suture removal in a week.   Final Clinical Impression(s) / ED Diagnoses Final diagnoses:  None    Rx / DC Orders ED Discharge Orders     None        Drenda Freeze, MD 06/15/21 2027

## 2021-06-15 NOTE — Chronic Care Management (AMB) (Signed)
Chronic Care Management   Note  06/15/2021 Name: JWAN HORNBAKER MRN: [id number] DOB: 1941-02-23  VIRAAJ VORNDRAN is a 80 y.o. year old male who is a primary care patient of Burchette, Alinda Sierras, MD. I reached out to Stevie Kern by phone today in response to a referral sent by Mr. [employer]'s PCP.  Mr. Mode was given information about Chronic Care Management services today including:  CCM service includes personalized support from designated clinical staff supervised by his physician, including individualized plan of care and coordination with other care providers 24/7 contact phone numbers for assistance for urgent and routine care needs. Service will only be billed when office clinical staff spend 20 minutes or more in a month to coordinate care. Only one practitioner may furnish and bill the service in a calendar month. The patient may stop CCM services at any time (effective at the end of the month) by phone call to the office staff. The patient is responsible for co-pay (up to 20% after annual deductible is met) if co-pay is required by the individual health plan.   Patient spouse Mrs. Prowse verbally agreed to assistance and services provided by embedded care coordination/care management team today.  Follow up plan: Telephone appointment with care management team member scheduled for:06/23/2021  [facility]  Direct Dial: [phone]

## 2021-06-16 ENCOUNTER — Ambulatory Visit (INDEPENDENT_AMBULATORY_CARE_PROVIDER_SITE_OTHER): Payer: Medicare HMO

## 2021-06-16 DIAGNOSIS — I428 Other cardiomyopathies: Secondary | ICD-10-CM

## 2021-06-16 LAB — CUP PACEART REMOTE DEVICE CHECK
Battery Remaining Longevity: 102 mo
Battery Remaining Percentage: 94 %
Brady Statistic RV Percent Paced: 24 %
Date Time Interrogation Session: 20221011042500
HighPow Impedance: 40 Ohm
Implantable Lead Implant Date: 20060209
Implantable Lead Location: 753860
Implantable Lead Model: 158
Implantable Lead Serial Number: 156422
Implantable Pulse Generator Implant Date: 20150904
Lead Channel Impedance Value: 371 Ohm
Lead Channel Pacing Threshold Amplitude: 1.1 V
Lead Channel Pacing Threshold Pulse Width: 0.6 ms
Lead Channel Setting Pacing Amplitude: 2.4 V
Lead Channel Setting Pacing Pulse Width: 0.6 ms
Lead Channel Setting Sensing Sensitivity: 0.5 mV
Pulse Gen Serial Number: 191056

## 2021-06-23 ENCOUNTER — Ambulatory Visit (INDEPENDENT_AMBULATORY_CARE_PROVIDER_SITE_OTHER): Payer: Medicare HMO

## 2021-06-23 DIAGNOSIS — R413 Other amnesia: Secondary | ICD-10-CM

## 2021-06-23 DIAGNOSIS — I480 Paroxysmal atrial fibrillation: Secondary | ICD-10-CM

## 2021-06-23 DIAGNOSIS — R269 Unspecified abnormalities of gait and mobility: Secondary | ICD-10-CM

## 2021-06-23 DIAGNOSIS — I5032 Chronic diastolic (congestive) heart failure: Secondary | ICD-10-CM

## 2021-06-23 DIAGNOSIS — E782 Mixed hyperlipidemia: Secondary | ICD-10-CM

## 2021-06-23 NOTE — Patient Instructions (Signed)
Visit Information   PATIENT GOALS:   Goals Addressed             This Visit's Progress    RNCM:Prevent Falls and Injury       Timeframe:  Long-Range Goal Priority:  Medium Start Date:      06/23/21                       Expected End Date:      12/05/20                 Follow Up Date 07/28/21    - always use handrails on the stairs - always wear low-heeled or flat shoes or slippers with nonskid soles - keep a flashlight by the bed - make an emergency alert plan in case I fall - pick up clutter from the floors - use a nonslip pad with throw rugs, or remove them completely - use a cane or walker - use a nightlight in the bathroom - wear my glasses and/or hearing aid    Why is this important?   Most falls happen when it is hard for you to walk safely. Your balance may be off because of an illness. You may have pain in your knees, hip or other joints.  You may be overly tired or taking medicines that make you sleepy. You may not be able to see or hear clearly.  Falls can lead to broken bones, bruises or other injuries.  There are things you can do to help prevent falling.     Notes:      RNCM:Track and Manage Heart Rate and Rhythm-Atrial Fibrillation       Timeframe:  Long-Range Goal Priority:  High Start Date:        06/23/21                     Expected End Date:   12/05/20                    Follow Up Date 07/28/21    - check pulse (heart) rate once a day - make a plan to eat healthy - keep all lab appointments - take medicine as prescribed    Why is this important?   Atrial fibrillation may have no symptoms. Sometimes the symptoms get worse or happen more often.  It is important to keep track of what your symptoms are and when they happen.  A change in symptoms is important to discuss with your doctor or nurse.  Being active and healthy eating will also help you manage your heart condition.     Notes:      RNCM:Track and Manage My Blood Pressure-Hypertension        Timeframe:  Long-Range Goal Priority:  High Start Date:     06/23/21                        Expected End Date:     12/05/20                  Follow Up Date 07/28/21    - check blood pressure weekly - choose a place to take my blood pressure (home, clinic or office, retail store) - write blood pressure results in a log or diary    Why is this important?   You won't feel high blood pressure, but it can still hurt your blood vessels.  High blood pressure can cause heart or kidney problems. It can also cause a stroke.  Making lifestyle changes like losing a little weight or eating less salt will help.  Checking your blood pressure at home and at different times of the day can help to control blood pressure.  If the doctor prescribes medicine remember to take it the way the doctor ordered.  Call the office if you cannot afford the medicine or if there are questions about it.     Notes:       It is important to avoid accidents which may result in broken bones.  Here are a few ideas on how to make your home safer so you will be less likely to trip or fall.  Use nonskid mats or non slip strips in your shower or tub, on your bathroom floor and around sinks.  If you know that you have spilled water, wipe it up! In the bathroom, it is important to have properly installed grab bars on the walls or on the edge of the tub.  Towel racks are NOT strong enough for you to hold onto or to pull on for support. Stairs and hallways should have enough light.  Add lamps or night lights if you need ore light. It is good to have handrails on both sides of the stairs if possible.  Always fix broken handrails right away. It is important to see the edges of steps.  Paint the edges of outdoor steps white so you can see them better.  Put colored tape on the edge of inside steps. Throw-rugs are dangerous because they can slide.  Removing the rugs is the best idea, but if they must stay, add adhesive carpet tape to  prevent slipping. Do not keep things on stairs or in the halls.  Remove small furniture that blocks the halls as it may cause you to trip.  Keep telephone and electrical cords out of the way where you walk. Always were sturdy, rubber-soled shoes for good support.  Never wear just socks, especially on the stairs.  Socks may cause you to slip or fall.  Do not wear full-length housecoats as you can easily trip on the bottom.  Place the things you use the most on the shelves that are the easiest to reach.  If you use a stepstool, make sure it is in good condition.  If you feel unsteady, DO NOT climb, ask for help. If a health professional advises you to use a cane or walker, do not be ashamed.  These items can keep you from falling and breaking your bones.  A-Fib ACTION PLAN Actions to Take if My Symptoms Get Worse   WHAT ZONE ARE YOU IN TODAY? Green, Yellow, or Red?    GREEN ZONE: This is your goal!  No shortness of breath.  No weakness.  No dizziness. No continuous racing of the heart.   YELLOW ZONE: Call your doctor TODAY to get help!  You may have one or more of the following:  Racing or irregular heart beat that my be uncomfortable. Shortness of breath with or without chest pain.  Weakness (unusually tired or feeling faint).  Dizziness.    RED ZONE: EMERGENCY CALL 911!  Increased chest pain that is continuous.  Fainting or near fainting.  Struggling to breathe.  New confusion or can't think clearly.  Symptoms of Stroke: F - Face Drooping  A - Arms drift downward when both arms are raised                                         S - Slurred Speech                                         T - Time to call 911 immediately.     Consent to CCM Services: Mr. Ruppert was given information about Chronic Care Management services including:  CCM service includes personalized support from designated clinical staff supervised by his physician,  including individualized plan of care and coordination with other care providers 24/7 contact phone numbers for assistance for urgent and routine care needs. Service will only be billed when office clinical staff spend 20 minutes or more in a month to coordinate care. Only one practitioner may furnish and bill the service in a calendar month. The patient may stop CCM services at any time (effective at the end of the month) by phone call to the office staff. The patient will be responsible for cost sharing (co-pay) of up to 20% of the service fee (after annual deductible is met).  Patient agreed to services and verbal consent obtained.   Patient verbalizes understanding of instructions provided today and agrees to view in [place].   Telephone follow up appointment with care management team member scheduled for: 07/28/21 at 2 PM Kill[place], BSN,CCM, CDE Care Management Coordinator [facility] [phone], Mobile [phone]   CLINICAL CARE PLAN: Patient Care Plan: Cardiovascular disease ( Atrial Fib V tach,CHF, HTN and HLD)     Problem Identified: Need for long term self managment of Cardiovascular disease ( Atrial Fib V tach,CHF, HTN and HLD)   Priority: High     Long-Range Goal: Long term self managment of Cardiovascular disease ( Atrial Fib V tach,CHF, HTN and HLD)   Start Date: 06/23/2021  Expected End Date: 12/05/2021  This Visit's Progress: On track  Priority: High  Note:   Current Barriers:  Knowledge deficits related to self health management of Cardiovascular disease ( Atrial Fib V tach,CHF, HTN and HLD) Knowledge Deficits related to Cardiovascular disease ( Atrial Fib V tach,CHF, HTN and HLD) Chronic Disease Management support and education needs related to Cardiovascular disease ( Atrial Fib V tach,CHF, HTN and HLD) Cognitive Deficits Unable to independently Cardiovascular disease ( Atrial Fib V tach,CHF, HTN and HLD) Unable to perform IADLs  independently Spoke with wife Nahsir Venezia.  States pt is followed by cardiology for his heart and his defibrillator.  States she gives him his medications.  States he eats good and she tries to give him healthy food.  Denies any chest pains, shortness of breath or increase in leg swelling Nurse Case Manager Clinical Goal(s):  patient will take all medications exactly as prescribed and will call provider for medication related questions patient will verbalize understanding of Afib Action Plan and when to call doctor patient will verbalize understanding of plan for Cardiovascular disease ( Atrial Fib V tach,CHF, HTN and HLD) patient will not experience hospital admission. Hospital Admissions in last 6 months = 1 patient will take all medications exactly as prescribed and will call provider for medication related questions patient will attend all scheduled medical appointments: Dr. Elease Hashimoto 06/23/21, Neurology  08/24/21 the patient will demonstrate ongoing self health care management ability Interventions:  Collaboration with Burchette, Alinda Sierras, MD regarding development and update of comprehensive plan of care as evidenced by provider attestation and co-signature Inter-disciplinary care team collaboration (see longitudinal plan of care) Basic overview and discussion of afib disease state Medications reviewed Afib action plan reviewed Evaluation of current treatment plan related to Cardiovascular disease ( Atrial Fib V tach,CHF, HTN and HLD) and patient's adherence to plan as established by provider. Provided education to patient re: Cardiovascular disease ( Atrial Fib V tach,CHF, HTN and HLD) Reviewed medications with patient and discussed adherence Advised patient, providing education and rationale, to monitor blood pressure daily and record, calling provider for findings outside established parameters.  Provided patient with written and verbal educational materials related to Cardiovascular  disease ( Atrial Fib V tach,CHF, HTN and HLD) Discussed plans with patient for ongoing care management follow up and provided patient with direct contact information for care management team Self-Care Activities: Self administers medications as prescribed Attends all scheduled provider appointments Calls pharmacy for medication refills Calls provider office for new concerns or questions Patient Goals: - check pulse (heart) rate once a day - make a plan to eat healthy - keep all lab appointments - take medicine as prescribed - check blood pressure weekly - choose a place to take my blood pressure (home, clinic or office, retail store) - write blood pressure results in a log or diary Follow Up Plan: Telephone follow up appointment with care management team member scheduled for: 07/28/21 at 2 PM The patient has been provided with contact information for the care management team and has been advised to call with any health related questions or concerns.      Patient Care Plan: Dementia/Fall Risk     Problem Identified: Dementia/Fall Risk   Priority: Medium     Long-Range Goal: Absence of Fall and Fall-Related Injury   Start Date: 06/23/2021  Expected End Date: 12/05/2021  This Visit's Progress: On track  Priority: Medium  Note:   Current Barriers:  Knowledge Deficits related to fall precautions in patient with dementia, impaired balance Decreased adherence to prescribed treatment for fall prevention Unable to independently self manage dementia/fall prevention Unable to perform IADLs independently Knowledge Deficits related to self management of dementia/fall prevention Chronic Disease Management support and education needs related to self management of dementia/fall prevention Cognitive Deficits Spoke with wife.  States pt goes to [facility] day [facility] 5 days a week where he eats breakfast and lunch.  States pt has poor short term memory.  States he has poor balance and uses  walker or cane.  States he did have a fall on 06/15/21 where he fell bending over.  States he had cut on his head with stitches. Wife states that she goes to a caregiver support group for dementia pts and states she does not need social worker referral at this time.  States she gives pt his medications and she uses a medication box.    Clinical Goal(s):  patient will demonstrate improved adherence to prescribed treatment plan for decreasing falls as evidenced by patient reporting and review of EMR patient will verbalize using fall risk reduction strategies discussed patient will not experience additional falls patient will verbalize understanding of plan for self management of dementia/fall prevention patient will not experience hospital admission. Hospital Admissions in last 6 months = 1 patient will take all medications exactly as prescribed and will call provider for medication related questions  patient will attend all scheduled medical appointments: Dr. Elease Hashimoto 06/24/21, Neurology 08/24/21 the patient will demonstrate ongoing self health care management ability Interventions:  Collaboration with Eulas Post, MD regarding development and update of comprehensive plan of care as evidenced by provider attestation and co-signature Inter-disciplinary care team collaboration (see longitudinal plan of care) Provided written and verbal education re: Potential causes of falls and Fall prevention strategies Reviewed medications and discussed potential side effects of medications such as dizziness and frequent urination Assessed for s/s of orthostatic hypotension Assessed for falls since last encounter. Assessed patients knowledge of fall risk prevention secondary to previously provided education. Assessed working status of life alert bracelet and patient adherence Provided patient information for fall alert systems Evaluation of current treatment plan related to self management of dementia/fall  prevention and patient's adherence to plan as established by provider. Provided education to patient re: self management of dementia/fall prevention Discussed plans with patient for ongoing care management follow up and provided patient with direct contact information for care management team Self-Care Deficits:  Unable to independently self management of dementia/fall prevention Unable to perform IADLs independently Patient Goals:  - Utilize walker or cane (assistive device) appropriately with all ambulation - De-clutter walkways - Change positions slowly - Wear secure fitting shoes at all times with ambulation - Utilize home lighting for dim lit areas - Demonstrate self and pet awareness at all times Follow Up Plan: Telephone follow up appointment with care management team member scheduled for: 07/28/21 at 2 PM The patient has been provided with contact information for the care management team and has been advised to call with any health related questions or concerns.

## 2021-06-23 NOTE — Chronic Care Management (AMB) (Signed)
Chronic Care Management   CCM RN Visit Note  06/23/2021 Name: Collin [name] MRN: [id number] DOB: 1941/06/10  Subjective: Collin [name] is a 80 y.o. year old male who is a primary care patient of Burchette, Elberta Fortis, MD. The care management team was consulted for assistance with disease management and care coordination needs.    Engaged with patient by telephone for initial visit in response to provider referral for case management and/or care coordination services.   Consent to Services:  The patient was given the following information about Chronic Care Management services today, agreed to services, and gave verbal consent: 1. CCM service includes personalized support from designated clinical staff supervised by the primary care provider, including individualized plan of care and coordination with other care providers 2. 24/7 contact phone numbers for assistance for urgent and routine care needs. 3. Service will only be billed when office clinical staff spend 20 minutes or more in a month to coordinate care. 4. Only one practitioner may furnish and bill the service in a calendar month. 5.The patient may stop CCM services at any time (effective at the end of the month) by phone call to the office staff. 6. The patient will be responsible for cost sharing (co-pay) of up to 20% of the service fee (after annual deductible is met). Patient agreed to services and consent obtained.  Patient agreed to services and verbal consent obtained.   Assessment: Review of patient past medical history, allergies, medications, health status, including review of consultants reports, laboratory and other test data, was performed as part of comprehensive evaluation and provision of chronic care management services.   SDOH (Social Determinants of Health) assessments and interventions performed:  SDOH Interventions    Flowsheet Row Most Recent Value  SDOH Interventions   Food Insecurity Interventions  Intervention Not Indicated  Financial Strain Interventions Intervention Not Indicated  Housing Interventions Intervention Not Indicated  Physical Activity Interventions Patient Refused  Stress Interventions Intervention Not Indicated  Social Connections Interventions Intervention Not Indicated  Transportation Interventions Intervention Not Indicated        CCM Care Plan  Allergies  Allergen Reactions   Niaspan [Niacin Er] Other (See Comments)    FLUSHED    Outpatient Encounter Medications as of 06/23/2021  Medication Sig   acetaminophen (TYLENOL) 500 MG tablet Take 1,000 mg by mouth daily as needed for moderate pain.    carvedilol (COREG) 25 MG tablet TAKE 1 TABLET BY MOUTH TWO TIMES A DAY NEEDS APPOINTMENT   Cholecalciferol (VITAMIN D3) 50 MCG (2000 UT) TABS Take 1 tablet by mouth daily.   diltiazem (CARDIZEM) 90 MG tablet TAKE ONE TABLET BY MOUTH DAILY   donepezil (ARICEPT) 10 MG tablet TAKE ONE TABLET BY MOUTH DAILY   memantine (NAMENDA) 10 MG tablet Take 10 mg by mouth 2 (two) times daily.   PARoxetine (PAXIL) 20 MG tablet Take 1 tablet (20 mg total) by mouth daily.   polyethylene glycol (MIRALAX) packet Take 17 g by mouth daily as needed. Available OTC   potassium chloride (KLOR-CON) 10 MEQ tablet DISSOLVE 1 TABLET IN WATER AND ADMINISTER VIA PEG TUBE TAKE ONE TABLET BY MOUTH DAILY   rosuvastatin (CRESTOR) 10 MG tablet Take 1 tablet (10 mg total) by mouth daily.   torsemide (DEMADEX) 20 MG tablet TAKE ONE AND ONE-HALF TABLETS (30 MG) BY MOUTH DAILY, ALTERNATING WITH 1 TABLET (20 MG) EVERY THIRD DAY   warfarin (COUMADIN) 5 MG tablet Take as instructed by the Coumadin clinic.  No facility-administered encounter medications on file as of 06/23/2021.    Patient Active Problem List   Diagnosis Date Noted   Memory loss 04/17/2021   Facial swelling 02/19/2021   Odontogenic infection of jaw 02/19/2021   Dental abscess 02/19/2021   Osteoarthritis of both hands 09/19/2019    Malnutrition of moderate degree 11/18/2017   Supratherapeutic INR 11/14/2017   AKI (acute kidney injury) (HCC) 11/12/2017   Vein disorder    Umbilical hernia    Shingles    History of radiation therapy    History of echocardiogram    Heart disease    Dysrhythmia    Claustrophobia    CHF (congestive heart failure) (HCC)    Cancer (HCC)    Atrial fibrillation (HCC)    AICD (automatic cardioverter/defibrillator) present    History of head and neck cancer 07/25/2017   Long term (current) use of anticoagulants 06/22/2017   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   Thrush, oral 12/21/2016   Weight loss, abnormal 12/21/2016   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   Umbilical hernia without obstruction and without gangrene    Pressure injury of skin 11/14/2016   Tongue mass: Per Ct neck 11/10/2016 11/12/2016   SBO (small bowel obstruction) (HCC) 11/11/2016   Encounter for therapeutic drug monitoring 10/25/2013   Diabetic peripheral neuropathy (HCC) 10/14/2011   Hypotension 08/20/2011   Acute renal failure (HCC) 08/20/2011   OSA (obstructive sleep apnea) 04/15/2011   ICD-[employer] 03/30/2011   Chronic diastolic CHF (congestive heart failure) (HCC) 03/30/2011   PAF (paroxysmal atrial fibrillation) (HCC) 12/14/2010   Nonischemic cardiomyopathy (HCC) 12/14/2010   Type 2 diabetes mellitus without complication, without long-term current use of insulin (HCC) 12/14/2010   Mixed hyperlipidemia 12/14/2010   Cardiomyopathy, nonischemic (HCC) 09/06/1996    Conditions to be addressed/monitored:Atrial Fibrillation, CHF, HTN, HLD, and Dementia  Care Plan : Cardiovascular disease ( Atrial Fib V tach,CHF, HTN and HLD)  Updates made by Yetta Glassman, RN since 06/23/2021 12:00 AM     Problem: Need for long term self managment of Cardiovascular disease ( Atrial Fib V tach,CHF, HTN and HLD)   Priority: High     Long-Range Goal: Long term self  managment of Cardiovascular disease ( Atrial Fib V tach,CHF, HTN and HLD)   Start Date: 06/23/2021  Expected End Date: 12/05/2021  This Visit's Progress: On track  Priority: High  Note:   Current Barriers:  Knowledge deficits related to self health management of Cardiovascular disease ( Atrial Fib V tach,CHF, HTN and HLD) Knowledge Deficits related to Cardiovascular disease ( Atrial Fib V tach,CHF, HTN and HLD) Chronic Disease Management support and education needs related to Cardiovascular disease ( Atrial Fib V tach,CHF, HTN and HLD) Cognitive Deficits Unable to independently Cardiovascular disease ( Atrial Fib V tach,CHF, HTN and HLD) Unable to perform IADLs independently Spoke with wife Ran Sowells.  States pt is followed by cardiology for his heart and his defibrillator.  States she gives him his medications.  States he eats good and she tries to give him healthy food.  Denies any chest pains, shortness of breath or increase in leg swelling Nurse Case Manager Clinical Goal(s):  patient will take all medications exactly as prescribed and will call provider for medication related questions patient will verbalize understanding of Afib Action Plan and when to call doctor patient will verbalize understanding of plan for Cardiovascular disease ( Atrial Fib V tach,CHF, HTN and HLD) patient will not experience hospital admission.  Hospital Admissions in last 6 months = 1 patient will take all medications exactly as prescribed and will call provider for medication related questions patient will attend all scheduled medical appointments: Dr. Caryl Never 06/23/21, Neurology 08/24/21 the patient will demonstrate ongoing self health care management ability Interventions:  Collaboration with [name], MD regarding development and update of comprehensive plan of care as evidenced by provider attestation and co-signature Inter-disciplinary care team collaboration (see longitudinal plan of  care) Basic overview and discussion of afib disease state Medications reviewed Afib action plan reviewed Evaluation of current treatment plan related to Cardiovascular disease ( Atrial Fib V tach,CHF, HTN and HLD) and patient's adherence to plan as established by provider. Provided education to patient re: Cardiovascular disease ( Atrial Fib V tach,CHF, HTN and HLD) Reviewed medications with patient and discussed adherence Advised patient, providing education and rationale, to monitor blood pressure daily and record, calling provider for findings outside established parameters.  Provided patient with written and verbal educational materials related to Cardiovascular disease ( Atrial Fib V tach,CHF, HTN and HLD) Discussed plans with patient for ongoing care management follow up and provided patient with direct contact information for care management team Self-Care Activities: Self administers medications as prescribed Attends all scheduled provider appointments Calls pharmacy for medication refills Calls provider office for new concerns or questions Patient Goals: - check pulse (heart) rate once a day - make a plan to eat healthy - keep all lab appointments - take medicine as prescribed - check blood pressure weekly - choose a place to take my blood pressure (home, clinic or office, retail store) - write blood pressure results in a log or diary Follow Up Plan: Telephone follow up appointment with care management team member scheduled for: 07/28/21 at 2 PM The patient has been provided with contact information for the care management team and has been advised to call with any health related questions or concerns.      Care Plan : Dementia/Fall Risk  Updates made by Yetta Glassman, RN since 06/23/2021 12:00 AM     Problem: Dementia/Fall Risk   Priority: Medium     Long-Range Goal: Absence of Fall and Fall-Related Injury   Start Date: 06/23/2021  Expected End Date: 12/05/2021   This Visit's Progress: On track  Priority: Medium  Note:   Current Barriers:  Knowledge Deficits related to fall precautions in patient with dementia, impaired balance Decreased adherence to prescribed treatment for fall prevention Unable to independently self manage dementia/fall prevention Unable to perform IADLs independently Knowledge Deficits related to self management of dementia/fall prevention Chronic Disease Management support and education needs related to self management of dementia/fall prevention Cognitive Deficits Spoke with wife.  States pt goes to [facility] 5 days a week where he eats breakfast and lunch.  States pt has poor short term memory.  States he has poor balance and uses walker or cane.  States he did have a fall on 06/15/21 where he fell bending over.  States he had cut on his head with stitches. Wife states that she goes to a caregiver support group for dementia pts and states she does not need social worker referral at this time.  States she gives pt his medications and she uses a medication box.    Clinical Goal(s):  patient will demonstrate improved adherence to prescribed treatment plan for decreasing falls as evidenced by patient reporting and review of EMR patient will verbalize using fall risk reduction strategies discussed patient will  not experience additional falls patient will verbalize understanding of plan for self management of dementia/fall prevention patient will not experience hospital admission. Hospital Admissions in last 6 months = 1 patient will take all medications exactly as prescribed and will call provider for medication related questions patient will attend all scheduled medical appointments: Dr. Caryl Never 06/24/21, Neurology 08/24/21 the patient will demonstrate ongoing self health care management ability Interventions:  Collaboration with [name], MD regarding development and update of comprehensive plan of care  as evidenced by provider attestation and co-signature Inter-disciplinary care team collaboration (see longitudinal plan of care) Provided written and verbal education re: Potential causes of falls and Fall prevention strategies Reviewed medications and discussed potential side effects of medications such as dizziness and frequent urination Assessed for s/s of orthostatic hypotension Assessed for falls since last encounter. Assessed patients knowledge of fall risk prevention secondary to previously provided education. Assessed working status of life alert bracelet and patient adherence Provided patient information for fall alert systems Evaluation of current treatment plan related to self management of dementia/fall prevention and patient's adherence to plan as established by provider. Provided education to patient re: self management of dementia/fall prevention Discussed plans with patient for ongoing care management follow up and provided patient with direct contact information for care management team Self-Care Deficits:  Unable to independently self management of dementia/fall prevention Unable to perform IADLs independently Patient Goals:  - Utilize walker or cane (assistive device) appropriately with all ambulation - De-clutter walkways - Change positions slowly - Wear secure fitting shoes at all times with ambulation - Utilize home lighting for dim lit areas - Demonstrate self and pet awareness at all times Follow Up Plan: Telephone follow up appointment with care management team member scheduled for: 07/28/21 at 2 PM The patient has been provided with contact information for the care management team and has been advised to call with any health related questions or concerns.       Plan:Telephone follow up appointment with care management team member scheduled for:  07/28/21 The patient has been provided with contact information for the care management team and has been advised to  call with any health related questions or concerns.  Dudley Major RN, Maximiano Coss, CDE Care Management Coordinator [facility] [phone], Mobile [phone]

## 2021-06-24 ENCOUNTER — Other Ambulatory Visit: Payer: Self-pay

## 2021-06-24 ENCOUNTER — Ambulatory Visit (INDEPENDENT_AMBULATORY_CARE_PROVIDER_SITE_OTHER): Payer: Medicare HMO | Admitting: Family Medicine

## 2021-06-24 VITALS — BP 104/70 | HR 65 | Temp 97.5°F | Wt 216.2 lb

## 2021-06-24 DIAGNOSIS — S80211A Abrasion, right knee, initial encounter: Secondary | ICD-10-CM

## 2021-06-24 DIAGNOSIS — M25462 Effusion, left knee: Secondary | ICD-10-CM

## 2021-06-24 DIAGNOSIS — Z23 Encounter for immunization: Secondary | ICD-10-CM | POA: Diagnosis not present

## 2021-06-24 DIAGNOSIS — S0181XD Laceration without foreign body of other part of head, subsequent encounter: Secondary | ICD-10-CM

## 2021-06-24 NOTE — Progress Notes (Signed)
Established Patient Office Visit  Subjective:  Patient ID: Collin [name], male    DOB: 07-Dec-1940  Age: 80 y.o. MRN: [id number]  CC:  Chief Complaint  Patient presents with   Suture / Staple Removal    HPI OSAMA COLESON presents for ER follow-up following fall.  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  Laceration right forehead.  He also sustained abrasion right anterior knee.  He has had some chronic intermittent left knee pain but perhaps some increased swelling since then.  Patient went to the ER.  He had 5 sutures placed on the right forehead laceration.  He had extensive imaging with CT head, maxillofacial, cervical spine, chest, abdomen, chest x-ray, and plain films of the right knee, right shoulder, and pelvis.  No acute bony abnormalities.  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  He actually has fairly large effusion of the left knee.  Labs unremarkable.  INR was 2.4.  Feels well at this time.  No headaches.  No dizziness.  No acute confusion.  Ambulating with a cane.  Sometimes uses a walker.  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 It sometimes shows as a shadow on scans.     Past Surgical History:  Procedure Laterality  Date   CARDIAC DEFIBRILLATOR PLACEMENT  2006   CATARACT EXTRACTION Bilateral    01/07/2016. 01/22/2016   CYST EXCISION     multiple drainage to a cyst in his neck.    DIRECT LARYNGOSCOPY N/A 11/16/2016   Procedure: DIRECT LARYNGOSCOPY AND BIOPSY;  Surgeon: Rozetta Nunnery, MD;  Location: [place];  Service: ENT;  Laterality: N/A;   EYE SURGERY     HERNIA REPAIR  1950s   IMPLANTABLE CARDIOVERTER DEFIBRILLATOR (ICD) GENERATOR CHANGE N/A 05/10/2014   Procedure: ICD GENERATOR CHANGE;  Surgeon: [name], MD;  Location: [facility] CATH LAB;  Service: Cardiovascular;  Laterality: N/A;   INSERTION OF MESH N/A 01/18/2018   Procedure: INSERTION OF MESH;  Surgeon: Jovita Kussmaul, MD;  Location: [place];  Service: General;  Laterality: N/A;   IR GASTROSTOMY TUBE MOD SED  12/27/2016   IR GASTROSTOMY TUBE REMOVAL  03/24/2018   IR PATIENT EVAL TECH 0-60 MINS  01/17/2017   MASS EXCISION Right 11/16/2016   Procedure: EXCISION RIGHT NECK LIPOMA;  Surgeon: Rozetta Nunnery, MD;  Location: [place];  Service: ENT;  Laterality: Right;   MOHS SURGERY  01/01/2015   to top of head   MOUTH SURGERY     teeth removed 4 teeth extracted 02/2010   RADICAL NECK DISSECTION Right  Established Patient Office Visit  Subjective:  Patient ID: Collin [name], male    DOB: 10/13/1940  Age: 80 y.o. MRN: [id number]  CC:  Chief Complaint  Patient presents with   Suture / Staple Removal    HPI Collin [name] presents for ER follow-up following fall.  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  Laceration right forehead.  He also sustained abrasion right anterior knee.  He has had some chronic intermittent left knee pain but perhaps some increased swelling since then.  Patient went to the ER.  He had 5 sutures placed on the right forehead laceration.  He had extensive imaging with CT head, maxillofacial, cervical spine, chest, abdomen, chest x-ray, and plain films of the right knee, right shoulder, and pelvis.  No acute bony abnormalities.  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  He actually has fairly large effusion of the left knee.  Labs unremarkable.  INR was 2.4.  Feels well at this time.  No headaches.  No dizziness.  No acute confusion.  Ambulating with a cane.  Sometimes uses a walker.  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 It sometimes shows as a shadow on scans.     Past Surgical History:  Procedure Laterality  Date   CARDIAC DEFIBRILLATOR PLACEMENT  2006   CATARACT EXTRACTION Bilateral    01/07/2016. 01/22/2016   CYST EXCISION     multiple drainage to a cyst in his neck.    DIRECT LARYNGOSCOPY N/A 11/16/2016   Procedure: DIRECT LARYNGOSCOPY AND BIOPSY;  Surgeon: Rozetta Nunnery, MD;  Location: [place];  Service: ENT;  Laterality: N/A;   EYE SURGERY     HERNIA REPAIR  1950s   IMPLANTABLE CARDIOVERTER DEFIBRILLATOR (ICD) GENERATOR CHANGE N/A 05/10/2014   Procedure: ICD GENERATOR CHANGE;  Surgeon: [name], MD;  Location: [facility] CATH LAB;  Service: Cardiovascular;  Laterality: N/A;   INSERTION OF MESH N/A 01/18/2018   Procedure: INSERTION OF MESH;  Surgeon: Jovita Kussmaul, MD;  Location: [place];  Service: General;  Laterality: N/A;   IR GASTROSTOMY TUBE MOD SED  12/27/2016   IR GASTROSTOMY TUBE REMOVAL  03/24/2018   IR PATIENT EVAL TECH 0-60 MINS  01/17/2017   MASS EXCISION Right 11/16/2016   Procedure: EXCISION RIGHT NECK LIPOMA;  Surgeon: Rozetta Nunnery, MD;  Location: [place];  Service: ENT;  Laterality: Right;   MOHS SURGERY  01/01/2015   to top of head   MOUTH SURGERY     teeth removed 4 teeth extracted 02/2010   RADICAL NECK DISSECTION Right  Established Patient Office Visit  Subjective:  Patient ID: Collin [name], male    DOB: 10/13/1940  Age: 80 y.o. MRN: [id number]  CC:  Chief Complaint  Patient presents with   Suture / Staple Removal    HPI Collin [name] presents for ER follow-up following fall.  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  Laceration right forehead.  He also sustained abrasion right anterior knee.  He has had some chronic intermittent left knee pain but perhaps some increased swelling since then.  Patient went to the ER.  He had 5 sutures placed on the right forehead laceration.  He had extensive imaging with CT head, maxillofacial, cervical spine, chest, abdomen, chest x-ray, and plain films of the right knee, right shoulder, and pelvis.  No acute bony abnormalities.  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  He actually has fairly large effusion of the left knee.  Labs unremarkable.  INR was 2.4.  Feels well at this time.  No headaches.  No dizziness.  No acute confusion.  Ambulating with a cane.  Sometimes uses a walker.  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 It sometimes shows as a shadow on scans.     Past Surgical History:  Procedure Laterality  Date   CARDIAC DEFIBRILLATOR PLACEMENT  2006   CATARACT EXTRACTION Bilateral    01/07/2016. 01/22/2016   CYST EXCISION     multiple drainage to a cyst in his neck.    DIRECT LARYNGOSCOPY N/A 11/16/2016   Procedure: DIRECT LARYNGOSCOPY AND BIOPSY;  Surgeon: Rozetta Nunnery, MD;  Location: [place];  Service: ENT;  Laterality: N/A;   EYE SURGERY     HERNIA REPAIR  1950s   IMPLANTABLE CARDIOVERTER DEFIBRILLATOR (ICD) GENERATOR CHANGE N/A 05/10/2014   Procedure: ICD GENERATOR CHANGE;  Surgeon: [name], MD;  Location: [facility] CATH LAB;  Service: Cardiovascular;  Laterality: N/A;   INSERTION OF MESH N/A 01/18/2018   Procedure: INSERTION OF MESH;  Surgeon: Jovita Kussmaul, MD;  Location: [place];  Service: General;  Laterality: N/A;   IR GASTROSTOMY TUBE MOD SED  12/27/2016   IR GASTROSTOMY TUBE REMOVAL  03/24/2018   IR PATIENT EVAL TECH 0-60 MINS  01/17/2017   MASS EXCISION Right 11/16/2016   Procedure: EXCISION RIGHT NECK LIPOMA;  Surgeon: Rozetta Nunnery, MD;  Location: [place];  Service: ENT;  Laterality: Right;   MOHS SURGERY  01/01/2015   to top of head   MOUTH SURGERY     teeth removed 4 teeth extracted 02/2010   RADICAL NECK DISSECTION Right  Established Patient Office Visit  Subjective:  Patient ID: Collin [name], male    DOB: 10/13/1940  Age: 80 y.o. MRN: [id number]  CC:  Chief Complaint  Patient presents with   Suture / Staple Removal    HPI Collin [name] presents for ER follow-up following fall.  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  Laceration right forehead.  He also sustained abrasion right anterior knee.  He has had some chronic intermittent left knee pain but perhaps some increased swelling since then.  Patient went to the ER.  He had 5 sutures placed on the right forehead laceration.  He had extensive imaging with CT head, maxillofacial, cervical spine, chest, abdomen, chest x-ray, and plain films of the right knee, right shoulder, and pelvis.  No acute bony abnormalities.  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  He actually has fairly large effusion of the left knee.  Labs unremarkable.  INR was 2.4.  Feels well at this time.  No headaches.  No dizziness.  No acute confusion.  Ambulating with a cane.  Sometimes uses a walker.  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 It sometimes shows as a shadow on scans.     Past Surgical History:  Procedure Laterality  Date   CARDIAC DEFIBRILLATOR PLACEMENT  2006   CATARACT EXTRACTION Bilateral    01/07/2016. 01/22/2016   CYST EXCISION     multiple drainage to a cyst in his neck.    DIRECT LARYNGOSCOPY N/A 11/16/2016   Procedure: DIRECT LARYNGOSCOPY AND BIOPSY;  Surgeon: Rozetta Nunnery, MD;  Location: [place];  Service: ENT;  Laterality: N/A;   EYE SURGERY     HERNIA REPAIR  1950s   IMPLANTABLE CARDIOVERTER DEFIBRILLATOR (ICD) GENERATOR CHANGE N/A 05/10/2014   Procedure: ICD GENERATOR CHANGE;  Surgeon: [name], MD;  Location: [facility] CATH LAB;  Service: Cardiovascular;  Laterality: N/A;   INSERTION OF MESH N/A 01/18/2018   Procedure: INSERTION OF MESH;  Surgeon: Jovita Kussmaul, MD;  Location: [place];  Service: General;  Laterality: N/A;   IR GASTROSTOMY TUBE MOD SED  12/27/2016   IR GASTROSTOMY TUBE REMOVAL  03/24/2018   IR PATIENT EVAL TECH 0-60 MINS  01/17/2017   MASS EXCISION Right 11/16/2016   Procedure: EXCISION RIGHT NECK LIPOMA;  Surgeon: Rozetta Nunnery, MD;  Location: [place];  Service: ENT;  Laterality: Right;   MOHS SURGERY  01/01/2015   to top of head   MOUTH SURGERY     teeth removed 4 teeth extracted 02/2010   RADICAL NECK DISSECTION Right

## 2021-06-24 NOTE — Progress Notes (Signed)
Remote ICD transmission.   

## 2021-06-24 NOTE — Patient Instructions (Signed)
Ice knee 20 minutes 2-3 times daily  Consider elastic knee sleeve for compression.

## 2021-06-29 ENCOUNTER — Ambulatory Visit (INDEPENDENT_AMBULATORY_CARE_PROVIDER_SITE_OTHER): Payer: Medicare HMO

## 2021-06-29 ENCOUNTER — Other Ambulatory Visit: Payer: Self-pay

## 2021-06-29 DIAGNOSIS — Z7901 Long term (current) use of anticoagulants: Secondary | ICD-10-CM

## 2021-06-29 LAB — POCT INR: INR: 2.1 (ref 2.0–3.0)

## 2021-06-29 NOTE — Patient Instructions (Addendum)
Pre visit review using our clinic review tool, if applicable. No additional management support is needed unless otherwise documented below in the visit note.  Continue 1/2 tablet daily except take 1 tablet on Mon, Wed, and Fri.   Recheck in 4 wks.   Call Belt, RN @ [phone] when you get a date for surgery.

## 2021-07-06 DIAGNOSIS — I5032 Chronic diastolic (congestive) heart failure: Secondary | ICD-10-CM | POA: Diagnosis not present

## 2021-07-06 DIAGNOSIS — E782 Mixed hyperlipidemia: Secondary | ICD-10-CM | POA: Diagnosis not present

## 2021-07-06 DIAGNOSIS — I480 Paroxysmal atrial fibrillation: Secondary | ICD-10-CM

## 2021-07-13 DIAGNOSIS — M25562 Pain in left knee: Secondary | ICD-10-CM | POA: Diagnosis not present

## 2021-07-24 ENCOUNTER — Other Ambulatory Visit: Payer: Self-pay | Admitting: Neurology

## 2021-07-27 ENCOUNTER — Ambulatory Visit (INDEPENDENT_AMBULATORY_CARE_PROVIDER_SITE_OTHER): Payer: Medicare HMO

## 2021-07-27 DIAGNOSIS — Z7901 Long term (current) use of anticoagulants: Secondary | ICD-10-CM

## 2021-07-27 LAB — POCT INR: INR: 2.4 (ref 2.0–3.0)

## 2021-07-27 NOTE — Patient Instructions (Addendum)
Pre visit review using our clinic review tool, if applicable. No additional management support is needed unless otherwise documented below in the visit note.  Continue 1/2 tablet daily except take 1 tablet on Mon, Wed, and Fri.   Recheck in 6 wks.

## 2021-07-27 NOTE — Progress Notes (Signed)
Continue 1/2 tablet daily except take 1 tablet on Mon, Wed, and Fri.   Recheck in 6 wks.

## 2021-07-28 ENCOUNTER — Ambulatory Visit (INDEPENDENT_AMBULATORY_CARE_PROVIDER_SITE_OTHER): Payer: Medicare HMO

## 2021-07-28 DIAGNOSIS — R413 Other amnesia: Secondary | ICD-10-CM

## 2021-07-28 DIAGNOSIS — R269 Unspecified abnormalities of gait and mobility: Secondary | ICD-10-CM

## 2021-07-28 DIAGNOSIS — I480 Paroxysmal atrial fibrillation: Secondary | ICD-10-CM

## 2021-07-28 DIAGNOSIS — I5032 Chronic diastolic (congestive) heart failure: Secondary | ICD-10-CM

## 2021-07-28 DIAGNOSIS — E782 Mixed hyperlipidemia: Secondary | ICD-10-CM

## 2021-07-28 NOTE — Patient Instructions (Addendum)
Visit Information  Thank you for taking time to visit with me today. Please don't hesitate to contact me if I can be of assistance to you before our next scheduled telephone appointment.  Following are the goals we discussed today:  Patient will self administer medications as prescribed Patient will attend all scheduled provider appointments Patient will call pharmacy for medication refills Patient will call provider office for new concerns or questions call office if I gain more than 2 pounds in one day or 5 pounds in one week keep legs up while sitting use salt in moderation watch for swelling in feet, ankles and legs every day eat more whole grains, fruits and vegetables, lean meats and healthy fats - check pulse (heart) rate once a day - make a plan to eat healthy - keep all lab appointments - take medicine as prescribed check blood pressure weekly limit salt intake to 2300mg /day - call for medicine refill 2 or 3 days before it runs out - take all medications exactly as prescribed - call doctor with any symptoms you believe are related to your medicine - always use handrails on the stairs - always wear low-heeled or flat shoes or slippers with nonskid soles - keep a flashlight by the bed - make an emergency alert plan in case I fall - pick up clutter from the floors - use a nonslip pad with throw rugs, or remove them completely - use a cane or walker - use a nightlight in the bathroom - wear my glasses and/or hearing aid  Our next appointment is by telephone on 09/29/21 at 11:30 AM  Please call the care guide team at [phone] if you need to cancel or reschedule your appointment.   Please call 911 go to [facility] [place] [phone]) call the Suicide and Crisis Lifeline: 988 call 1-800-273-TALK (toll free, 24 hour hotline) if you are experiencing a Mental Health or [place] or need someone to talk  to.  Patient verbalizes understanding of instructions provided today and agrees to view in [place].  Peter Garter RN, Jackquline Denmark, CDE Care Management Coordinator [facility] [phone], Mobile [phone]

## 2021-07-28 NOTE — Chronic Care Management (AMB) (Signed)
Chronic Care Management   CCM RN Visit Note  07/28/2021 Name: Collin [name] MRN: [id number] DOB: 06-06-1941  Subjective: Collin [name] is a 80 y.o. year old male who is a primary care patient of Burchette, Elberta Fortis, MD. The care management team was consulted for assistance with disease management and care coordination needs.    Engaged with patient by telephone for follow up visit in response to provider referral for case management and/or care coordination services.   Consent to Services:  The patient was given information about Chronic Care Management services, agreed to services, and gave verbal consent prior to initiation of services.  Please see initial visit note for detailed documentation.   Patient agreed to services and verbal consent obtained.   Assessment: Review of patient past medical history, allergies, medications, health status, including review of consultants reports, laboratory and other test data, was performed as part of comprehensive evaluation and provision of chronic care management services.   SDOH (Social Determinants of Health) assessments and interventions performed:    CCM Care Plan  Allergies  Allergen Reactions   Niaspan [Niacin Er] Other (See Comments)    FLUSHED    Outpatient Encounter Medications as of 07/28/2021  Medication Sig   acetaminophen (TYLENOL) 500 MG tablet Take 1,000 mg by mouth daily as needed for moderate pain.    carvedilol (COREG) 25 MG tablet TAKE 1 TABLET BY MOUTH TWO TIMES A DAY NEEDS APPOINTMENT   Cholecalciferol (VITAMIN D3) 50 MCG (2000 UT) TABS Take 1 tablet by mouth daily.   diltiazem (CARDIZEM) 90 MG tablet TAKE ONE TABLET BY MOUTH DAILY   donepezil (ARICEPT) 10 MG tablet TAKE ONE TABLET BY MOUTH DAILY   memantine (NAMENDA) 10 MG tablet Take 10 mg by mouth 2 (two) times daily.   PARoxetine (PAXIL) 20 MG tablet Take 1 tablet (20 mg total) by mouth daily.   polyethylene glycol (MIRALAX) packet Take 17 g by mouth daily  as needed. Available OTC   potassium chloride (KLOR-CON) 10 MEQ tablet DISSOLVE 1 TABLET IN WATER AND ADMINISTER VIA PEG TUBE TAKE ONE TABLET BY MOUTH DAILY   rosuvastatin (CRESTOR) 10 MG tablet Take 1 tablet (10 mg total) by mouth daily.   torsemide (DEMADEX) 20 MG tablet TAKE ONE AND ONE-HALF TABLETS (30 MG) BY MOUTH DAILY, ALTERNATING WITH 1 TABLET (20 MG) EVERY THIRD DAY   warfarin (COUMADIN) 5 MG tablet Take as instructed by the Coumadin clinic.   No facility-administered encounter medications on file as of 07/28/2021.    Patient Active Problem List   Diagnosis Date Noted   Memory loss 04/17/2021   Facial swelling 02/19/2021   Odontogenic infection of jaw 02/19/2021   Dental abscess 02/19/2021   Osteoarthritis of both hands 09/19/2019   Malnutrition of moderate degree 11/18/2017   Supratherapeutic INR 11/14/2017   AKI (acute kidney injury) (HCC) 11/12/2017   Vein disorder    Umbilical hernia    Shingles    History of radiation therapy    History of echocardiogram    Heart disease    Dysrhythmia    Claustrophobia    CHF (congestive heart failure) (HCC)    Cancer (HCC)    Atrial fibrillation (HCC)    AICD (automatic cardioverter/defibrillator) present    History of head and neck cancer 07/25/2017   Long term (current) use of anticoagulants 06/22/2017   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   Thrush, oral 12/21/2016   Weight loss, abnormal 12/21/2016   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/24/2016   Umbilical hernia without obstruction and without gangrene    Pressure injury of skin 11/14/2016   Tongue mass: Per Ct neck 11/10/2016 11/12/2016   SBO (small bowel obstruction) (HCC) 11/11/2016   Encounter for therapeutic drug monitoring 10/25/2013   Diabetic peripheral neuropathy (HCC) 10/14/2011   Hypotension 08/20/2011   Acute renal failure (HCC) 08/20/2011   OSA (obstructive sleep apnea) 04/15/2011   ICD-[employer] 03/30/2011    Chronic diastolic CHF (congestive heart failure) (HCC) 03/30/2011   PAF (paroxysmal atrial fibrillation) (HCC) 12/14/2010   Nonischemic cardiomyopathy (HCC) 12/14/2010   Type 2 diabetes mellitus without complication, without long-term current use of insulin (HCC) 12/14/2010   Mixed hyperlipidemia 12/14/2010   Cardiomyopathy, nonischemic (HCC) 09/06/1996    Conditions to be addressed/monitored:Atrial Fibrillation, CHF, CAD, HLD, Dementia, and hx falls  Care Plan : Cardiovascular disease ( Atrial Fib V tach,CHF, HTN and HLD)  Updates made by Yetta Glassman, RN since 07/28/2021 12:00 AM  Completed 07/28/2021   Problem: Need for long term self managment of Cardiovascular disease ( Atrial Fib V tach,CHF, HTN and HLD) Resolved 07/28/2021  Priority: High     Long-Range Goal: Long term self managment of Cardiovascular disease ( Atrial Fib V tach,CHF, HTN and HLD) Completed 07/28/2021  Start Date: 06/23/2021  Expected End Date: 12/05/2021  Recent Progress: On track  Priority: High  Note:   Resolving due to duplicate goal  Current Barriers:  Knowledge deficits related to self health management of Cardiovascular disease ( Atrial Fib V tach,CHF, HTN and HLD) Knowledge Deficits related to Cardiovascular disease ( Atrial Fib V tach,CHF, HTN and HLD) Chronic Disease Management support and education needs related to Cardiovascular disease ( Atrial Fib V tach,CHF, HTN and HLD) Cognitive Deficits Unable to independently Cardiovascular disease ( Atrial Fib V tach,CHF, HTN and HLD) Unable to perform IADLs independently Spoke with wife Collin [name].  States pt is followed by cardiology for his heart and his defibrillator.  States she gives him his medications.  States he eats good and she tries to give him healthy food.  Denies any chest pains, shortness of breath or increase in leg swelling Nurse Case Manager Clinical Goal(s):  patient will take all medications exactly as prescribed and will call  provider for medication related questions patient will verbalize understanding of Afib Action Plan and when to call doctor patient will verbalize understanding of plan for Cardiovascular disease ( Atrial Fib V tach,CHF, HTN and HLD) patient will not experience hospital admission. Hospital Admissions in last 6 months = 1 patient will take all medications exactly as prescribed and will call provider for medication related questions patient will attend all scheduled medical appointments: Dr. Caryl Never 06/23/21, Neurology 08/24/21 the patient will demonstrate ongoing self health care management ability Interventions:  Collaboration with [name], MD regarding development and update of comprehensive plan of care as evidenced by provider attestation and co-signature Inter-disciplinary care team collaboration (see longitudinal plan of care) Basic overview and discussion of afib disease state Medications reviewed Afib action plan reviewed Evaluation of current treatment plan related to Cardiovascular disease ( Atrial Fib V tach,CHF, HTN and HLD) and patient's adherence to plan as established by provider. Provided education to patient re: Cardiovascular disease ( Atrial Fib V tach,CHF, HTN and HLD) Reviewed medications with patient and discussed adherence Advised patient, providing education and rationale, to monitor blood pressure daily and record, calling provider for findings outside established parameters.  Provided patient with written and verbal educational  materials related to Cardiovascular disease ( Atrial Fib V tach,CHF, HTN and HLD) Discussed plans with patient for ongoing care management follow up and provided patient with direct contact information for care management team Self-Care Activities: Self administers medications as prescribed Attends all scheduled provider appointments Calls pharmacy for medication refills Calls provider office for new concerns or questions Patient  Goals: - check pulse (heart) rate once a day - make a plan to eat healthy - keep all lab appointments - take medicine as prescribed - check blood pressure weekly - choose a place to take my blood pressure (home, clinic or office, retail store) - write blood pressure results in a log or diary Follow Up Plan: Telephone follow up appointment with care management team member scheduled for: 07/28/21 at 2 PM The patient has been provided with contact information for the care management team and has been advised to call with any health related questions or concerns.      Care Plan : Dementia/Fall Risk  Updates made by Yetta Glassman, RN since 07/28/2021 12:00 AM  Completed 07/28/2021   Problem: Dementia/Fall Risk Resolved 07/28/2021  Priority: Medium     Long-Range Goal: Absence of Fall and Fall-Related Injury Completed 07/28/2021  Start Date: 06/23/2021  Expected End Date: 12/05/2021  Recent Progress: On track  Priority: Medium  Note:   Resolving due to duplicate goal  Current Barriers:  Knowledge Deficits related to fall precautions in patient with dementia, impaired balance Decreased adherence to prescribed treatment for fall prevention Unable to independently self manage dementia/fall prevention Unable to perform IADLs independently Knowledge Deficits related to self management of dementia/fall prevention Chronic Disease Management support and education needs related to self management of dementia/fall prevention Cognitive Deficits Spoke with wife.  States pt goes to [facility] 5 days a week where he eats breakfast and lunch.  States pt has poor short term memory.  States he has poor balance and uses walker or cane.  States he did have a fall on 06/15/21 where he fell bending over.  States he had cut on his head with stitches. Wife states that she goes to a caregiver support group for dementia pts and states she does not need social worker referral at this time.   States she gives pt his medications and she uses a medication box.    Clinical Goal(s):  patient will demonstrate improved adherence to prescribed treatment plan for decreasing falls as evidenced by patient reporting and review of EMR patient will verbalize using fall risk reduction strategies discussed patient will not experience additional falls patient will verbalize understanding of plan for self management of dementia/fall prevention patient will not experience hospital admission. Hospital Admissions in last 6 months = 1 patient will take all medications exactly as prescribed and will call provider for medication related questions patient will attend all scheduled medical appointments: Dr. Caryl Never 06/24/21, Neurology 08/24/21 the patient will demonstrate ongoing self health care management ability Interventions:  Collaboration with [name], MD regarding development and update of comprehensive plan of care as evidenced by provider attestation and co-signature Inter-disciplinary care team collaboration (see longitudinal plan of care) Provided written and verbal education re: Potential causes of falls and Fall prevention strategies Reviewed medications and discussed potential side effects of medications such as dizziness and frequent urination Assessed for s/s of orthostatic hypotension Assessed for falls since last encounter. Assessed patients knowledge of fall risk prevention secondary to previously provided education. Assessed working status of life alert bracelet  and patient adherence Provided patient information for fall alert systems Evaluation of current treatment plan related to self management of dementia/fall prevention and patient's adherence to plan as established by provider. Provided education to patient re: self management of dementia/fall prevention Discussed plans with patient for ongoing care management follow up and provided patient with direct contact  information for care management team Self-Care Deficits:  Unable to independently self management of dementia/fall prevention Unable to perform IADLs independently Patient Goals:  - Utilize walker or cane (assistive device) appropriately with all ambulation - De-clutter walkways - Change positions slowly - Wear secure fitting shoes at all times with ambulation - Utilize home lighting for dim lit areas - Demonstrate self and pet awareness at all times Follow Up Plan: Telephone follow up appointment with care management team member scheduled for: 07/28/21 at 2 PM The patient has been provided with contact information for the care management team and has been advised to call with any health related questions or concerns.      Care Plan : RN Care Manager Plan of Care  Updates made by Yetta Glassman, RN since 07/28/2021 12:00 AM     Problem: Chronic Disease Management and Care Coordination Needs (A fib, CAD, Vtach, CHF, HLD, dementia, falls)   Priority: High     Long-Range Goal: Establish Plan of Care for Chronic Disease Management Needs (A fib, CAD, Vtach, CHF, HLD, dementia, falls)   Start Date: 07/28/2021  Expected End Date: 01/24/2022  Priority: High  Note:   Current Barriers:  Care Coordination needs related to Memory Deficits Chronic Disease Management support and education needs related to Atrial Fibrillation, CHF, CAD, HLD, Dementia, and falls Wife states that pt has been doing good.  States he had a cortisone shot in his knee and his pain is better now.  States he is using his walker now and has not had any falls.  States he is doing good at his memory day care.  Denies any chest pains, swelling or shortness of breath. Reports pt is eating well and she tries to give him a healthy diet. Denies any bleeding or bruising   States she is coping with caring for pt at this time and she is involved with a caregiver support group that helps.  Continues to decline LCSW referral  RNCM  Clinical Goal(s):  Patient will verbalize understanding of plan for management of Atrial Fibrillation, CHF, CAD, HLD, Dementia, and falls verbalize basic understanding of  Atrial Fibrillation, CHF, CAD, HLD, Dementia, and falls disease process and self health management plan . take all medications exactly as prescribed and will call provider for medication related questions attend all scheduled medical appointments: Coumadin clinic 09/11/21 demonstrate Ongoing adherence to prescribed treatment plan for Atrial Fibrillation, CHF, CAD, HLD, Dementia, and falls  as evidenced by readings within limits and voices adherence of plan of care  through collaboration with RN Care manager, provider, and care team.   Interventions: 1:1 collaboration with primary care provider regarding development and update of comprehensive plan of care as evidenced by provider attestation and co-signature Inter-disciplinary care team collaboration (see longitudinal plan of care) Evaluation of current treatment plan related to  self management and patient's adherence to plan as established by provider  Medication review performed; medication list updated in electronic medical record.  Provider established cholesterol goals reviewed; Counseled on importance of regular laboratory monitoring as prescribed; Reviewed role and benefits of statin for ASCVD risk reduction; DementiaGoal on track:  Yes Evaluation of current  treatment plan related to Dementia, Memory Deficits self-management and patient's adherence to plan as established by provider. Discussed plans with patient for ongoing care management follow up and provided patient with direct contact information for care management team Evaluation of current treatment plan related to Dementia and patient's adherence to plan as established by provider; Provided education to patient re: Dementia and caregiver stress; Discussed plans with patient for ongoing care management follow up  and provided patient with direct contact information for care management team;  Falls Interventions:Goal on track:  Yes Reviewed medications and discussed potential side effects of medications such as dizziness and frequent urination; Advised patient of importance of notifying provider of falls; Assessed for signs and symptoms of orthostatic hypotension; Assessed for falls since last encounter; Assessed patients knowledge of fall risk prevention secondary to previously provided education;  Heart Failure Interventions:Goal on track:  Yes Provided education on low sodium diet; Reviewed Heart Failure Action Plan in depth and provided written copy; Reviewed role of diuretics in prevention of fluid overload and management of heart failure; CAD Interventions: Goal on track:  Yes Assessed understanding of CAD diagnosis Medications reviewed including medications utilized in CAD treatment plan Provided education on importance of blood pressure control in management of CAD; Provided education on Importance of limiting foods high in cholesterol; Advised to report any changes in symptoms or exercise tolerance  AFIB Interventions: Goal on track:  Yes   Counseled on increased risk of stroke due to Afib and benefits of anticoagulation for stroke prevention; Reviewed importance of adherence to anticoagulant exactly as prescribed; Counseled on bleeding risk associated with Coumadin and importance of self-monitoring for signs/symptoms of bleeding; Counseled on importance of regular laboratory monitoring as prescribed; Counseled on seeking medical attention after a head injury or if there is blood in the urine/stool;  Patient Goals/Self-Care Activities: Patient will self administer medications as prescribed Patient will attend all scheduled provider appointments Patient will call pharmacy for medication refills Patient will call provider office for new concerns or questions call office if I gain more  than 2 pounds in one day or 5 pounds in one week keep legs up while sitting use salt in moderation watch for swelling in feet, ankles and legs every day eat more whole grains, fruits and vegetables, lean meats and healthy fats - check pulse (heart) rate once a day - make a plan to eat healthy - keep all lab appointments - take medicine as prescribed limit salt intake to 2300mg /day - call for medicine refill 2 or 3 days before it runs out - take all medications exactly as prescribed - call doctor with any symptoms you believe are related to your medicine - always use handrails on the stairs - always wear low-heeled or flat shoes or slippers with nonskid soles - keep a flashlight by the bed - make an emergency alert plan in case I fall - pick up clutter from the floors - use a nonslip pad with throw rugs, or remove them completely - use a cane or walker - use a nightlight in the bathroom - wear my glasses and/or hearing aid Follow Up Plan:  Telephone follow up appointment with care management team member scheduled for:  09/29/21 The patient has been provided with contact information for the care management team and has been advised to call with any health related questions or concerns.        Plan:Telephone follow up appointment with care management team member scheduled for:  09/29/21 The patient has  been provided with contact information for the care management team and has been advised to call with any health related questions or concerns.  Dudley Major RN, Maximiano Coss, CDE Care Management Coordinator [facility] [phone], Mobile [phone]

## 2021-08-05 DIAGNOSIS — E782 Mixed hyperlipidemia: Secondary | ICD-10-CM

## 2021-08-05 DIAGNOSIS — I480 Paroxysmal atrial fibrillation: Secondary | ICD-10-CM | POA: Diagnosis not present

## 2021-08-05 DIAGNOSIS — I5032 Chronic diastolic (congestive) heart failure: Secondary | ICD-10-CM

## 2021-08-10 ENCOUNTER — Telehealth: Payer: Self-pay | Admitting: Family Medicine

## 2021-08-10 NOTE — Telephone Encounter (Signed)
Patient's wife called because patient tested positive for covid yesterday (12/04)  and in order for him to be able to go back to the memory center he will need another covid test, done by our doctor's office, ten days from now and a note from [facility] stating patient is free of covid and okay to return to normal activities at the center. Patient was not scheduled as we did not know whether to schedule for OV or nurse visit per Mykal's advice       Good callback number is [phone]    Please advise

## 2021-08-11 ENCOUNTER — Telehealth: Payer: Self-pay | Admitting: Family Medicine

## 2021-08-11 NOTE — Telephone Encounter (Signed)
Pt has been scheduled for visit on 12/14

## 2021-08-11 NOTE — Telephone Encounter (Signed)
Patient's wife Clarene Critchley dropped off paperwork that she would like Dr. Elease Hashimoto to complete.  She would like the paperwork faxed to [phone] with ATTN to Lonzo Candy.  If the fax does not go through, Clarene Critchley would like to pick up the paperwork and she can be reached at [phone].  Paperwork will be placed in folder.  Please advise.

## 2021-08-12 NOTE — Telephone Encounter (Signed)
Form placed in front office for pickup and pt's wife is aware

## 2021-08-17 ENCOUNTER — Ambulatory Visit: Payer: Medicare HMO | Admitting: Physician Assistant

## 2021-08-19 ENCOUNTER — Ambulatory Visit (INDEPENDENT_AMBULATORY_CARE_PROVIDER_SITE_OTHER): Payer: Medicare HMO | Admitting: Family Medicine

## 2021-08-19 VITALS — BP 130/64 | HR 71 | Temp 97.9°F | Wt 211.1 lb

## 2021-08-19 DIAGNOSIS — I5032 Chronic diastolic (congestive) heart failure: Secondary | ICD-10-CM

## 2021-08-19 DIAGNOSIS — I4891 Unspecified atrial fibrillation: Secondary | ICD-10-CM | POA: Diagnosis not present

## 2021-08-19 DIAGNOSIS — U071 COVID-19: Secondary | ICD-10-CM | POA: Diagnosis not present

## 2021-08-19 NOTE — Progress Notes (Signed)
Established Patient Office Visit  Subjective:  Patient ID: Collin [name], male    DOB: 1941/08/13  Age: 80 y.o. MRN: [id number]  CC:  Chief Complaint  Patient presents with   Follow-up    Had covid 10 days ago, no lingering symp, needs a note to go back to adult daycare    HPI Collin [name] presents for recent COVID-19 infection.  Wife states this started about 10 days ago.  His symptoms were relatively mild.  He had some cough, mild nasal congestion, and fatigue.  He never had any fever.  They monitored his pulse oximetry regularly and this never dropped down below mid 90s.  Wife also came down with similar symptoms.  They think Collin [name] picked this up at memory care adult daycare program he has been attending through [facility].  He has no lingering symptoms.  They never sought out any care.  He never went on any antivirals.  Cough resolved at this time.  He feels back to baseline.  He has had previous vaccines.  Has chronic problems include history of atrial fibrillation, diastolic heart failure, tongue cancer, obstructive sleep apnea, controlled type 2 diabetes, osteoarthritis.  He remains on Coumadin.  Recent INR 2.4.  Wife oversees his medications.  He does have history of diastolic heart failure but no recent dyspnea even with the COVID.  No peripheral edema issues.  Weight is actually down slightly from previous visits.  Past Medical History:  Diagnosis Date   AICD (automatic cardioverter/defibrillator) present    Atrial fibrillation (HCC)    Cancer (HCC)    tongue cancer   Cardiomyopathy, nonischemic (HCC) 1998   CHF (congestive heart failure) (HCC)    Claustrophobia    Diabetes mellitus    Dysrhythmia    Heart disease    History of echocardiogram    Echo 8/18:  EF 55-60, no RWMA, severe LAE, normal RVSF, mild RAE   History of radiation therapy 12/07/16- 01/24/17   Base of Tongue 70 Gy in 35 fractions   Hyperlipidemia    Memory loss    OSA (obstructive sleep apnea)     Shingles    Umbilical hernia    Vein disorder    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 It sometimes shows as a shadow on scans.     Past Surgical History:  Procedure Laterality Date   CARDIAC DEFIBRILLATOR PLACEMENT  2006   CATARACT EXTRACTION Bilateral    01/07/2016. 01/22/2016   CYST EXCISION     multiple drainage to a cyst in his neck.    DIRECT LARYNGOSCOPY N/A 11/16/2016   Procedure: DIRECT LARYNGOSCOPY AND BIOPSY;  Surgeon: [name], MD;  Location[facility] OR;  Service: ENT;  Laterality: N/A;   EYE SURGERY     HERNIA REPAIR  1950s   IMPLANTABLE CARDIOVERTER DEFIBRILLATOR (ICD) GENERATOR CHANGE N/A 05/10/2014   Procedure: ICD GENERATOR CHANGE;  Surgeon: [name], MD;  Location: [facility] CATH LAB;  Service: Cardiovascular;  Laterality: N/A;   INSERTION OF MESH N/A 01/18/2018   Procedure: INSERTION OF MESH;  Surgeon: [name], MD;  Location: [facility] OR;  Service: General;  Laterality: N/A;   IR GASTROSTOMY TUBE MOD SED  12/27/2016   IR GASTROSTOMY TUBE REMOVAL  03/24/2018   IR PATIENT EVAL TECH 0-60 MINS  01/17/2017   MASS EXCISION Right 11/16/2016   Procedure: EXCISION RIGHT NECK LIPOMA;  Surgeon: [name], MD;  Location: [facility]  OR;  Service: ENT;  Laterality: Right;   MOHS SURGERY  01/01/2015   to top of head   MOUTH SURGERY     teeth removed 4 teeth extracted 02/2010   RADICAL NECK DISSECTION Right 07/25/2017   Procedure: RIGHT NECK DISSECTION;  Surgeon: [name], MD;  Location: [facility] OR;  Service: ENT;  Laterality: Right;   UMBILICAL HERNIA REPAIR  01/18/2018   w/mesh   UMBILICAL HERNIA REPAIR N/A 01/18/2018   Procedure: UMBILICAL HERNIA REPAIR WITH MESH;  Surgeon: [name], MD;  Location: [facility] OR;  Service: General;  Laterality: N/A;    Family History  Problem Relation Age of Onset   Heart disease Mother    Sudden death Mother        under age 17   Lupus Mother    Hypertension Father    Stroke Father      Social History   Socioeconomic History   Marital status: Married    Spouse name: Collin [name]   Number of children: 3   Years of education: Not on file   Highest education level: Not on file  Occupational History   Occupation: Retired Engineer, materials  Tobacco Use   Smoking status: Never   Smokeless tobacco: Never  Vaping Use   Vaping Use: Never used  Substance and Sexual Activity   Alcohol use: Yes    Comment: "a little wine every once in a while" 01/17/2018   Drug use: No   Sexual activity: Not on file  Other Topics Concern   Not on file  Social History Narrative   Pt lives in split level home with his wife, Collin [name].   Has 3 sons   9th grade education   Retired Electrical engineer   Right handed   Social Determinants of Health   Financial Resource Strain: Low Risk    Difficulty of Paying Living Expenses: Not hard at all  Food Insecurity: No Food Insecurity   Worried About Programme researcher, broadcasting/film/video in the Last Year: Never true   Barista in the Last Year: Never true  Transportation Needs: No Transportation Needs   Lack of Transportation (Medical): No   Lack of Transportation (Non-Medical): No  Physical Activity: Inactive   Days of Exercise per Week: 0 days   Minutes of Exercise per Session: 0 min  Stress: No Stress Concern Present   Feeling of Stress : Not at all  Social Connections: Unknown   Frequency of Communication with Friends and Family: More than three times a week   Frequency of Social Gatherings with Friends and Family: More than three times a week   Attends Religious Services: Not on Scientist, clinical (histocompatibility and immunogenetics) or Organizations: Not on file   Attends Banker Meetings: Not on file   Marital Status: Married  Catering manager Violence: Not on file    Outpatient Medications Prior to Visit  Medication Sig Dispense Refill   acetaminophen (TYLENOL) 500 MG tablet Take 1,000 mg by mouth daily as needed for moderate pain.      carvedilol (COREG) 25  MG tablet TAKE 1 TABLET BY MOUTH TWO TIMES A DAY NEEDS APPOINTMENT 180 tablet 2   Cholecalciferol (VITAMIN D3) 50 MCG (2000 UT) TABS Take 1 tablet by mouth daily.     diltiazem (CARDIZEM) 90 MG tablet TAKE ONE TABLET BY MOUTH DAILY 90 tablet 3   donepezil (ARICEPT) 10 MG tablet TAKE ONE TABLET BY MOUTH DAILY 30 tablet 0  memantine (NAMENDA) 10 MG tablet Take 10 mg by mouth 2 (two) times daily.     PARoxetine (PAXIL) 20 MG tablet Take 1 tablet (20 mg total) by mouth daily. 90 tablet 3   polyethylene glycol (MIRALAX) packet Take 17 g by mouth daily as needed. Available OTC 30 each 0   potassium chloride (KLOR-CON) 10 MEQ tablet DISSOLVE 1 TABLET IN WATER AND ADMINISTER VIA PEG TUBE TAKE ONE TABLET BY MOUTH DAILY 90 tablet 3   rosuvastatin (CRESTOR) 10 MG tablet Take 1 tablet (10 mg total) by mouth daily. 90 tablet 2   torsemide (DEMADEX) 20 MG tablet TAKE ONE AND ONE-HALF TABLETS (30 MG) BY MOUTH DAILY, ALTERNATING WITH 1 TABLET (20 MG) EVERY THIRD DAY 180 tablet 3   warfarin (COUMADIN) 5 MG tablet Take as instructed by the Coumadin clinic. 90 tablet 0   No facility-administered medications prior to visit.    Allergies  Allergen Reactions   Niaspan [Niacin Er] Other (See Comments)    FLUSHED    ROS Review of Systems  Constitutional:  Negative for chills and fever.  Respiratory:  Negative for cough, shortness of breath and wheezing.   Cardiovascular:  Negative for chest pain.  Gastrointestinal:  Negative for abdominal pain.  Genitourinary:  Negative for dysuria.  Neurological:  Negative for dizziness.  Psychiatric/Behavioral:  Negative for agitation.      Objective:    Physical Exam Vitals reviewed.  Constitutional:      General: He is not in acute distress.    Appearance: He is not ill-appearing or toxic-appearing.  Cardiovascular:     Rate and Rhythm: Normal rate.  Pulmonary:     Effort: Pulmonary effort is normal.     Breath sounds: Normal breath sounds. No wheezing or  rales.  Musculoskeletal:     Cervical back: Neck supple.  Neurological:     General: No focal deficit present.     Mental Status: He is alert.    BP 130/64 (BP Location: Left Arm, Patient Position: Sitting, Cuff Size: Normal)    Pulse 71    Temp 97.9 F (36.6 C) (Oral)    Wt 211 lb 1.6 oz (95.8 kg)    SpO2 98%    BMI 27.85 kg/m  Wt Readings from Last 3 Encounters:  08/19/21 211 lb 1.6 oz (95.8 kg)  06/24/21 216 lb 3.2 oz (98.1 kg)  06/15/21 220 lb (99.8 kg)     Health Maintenance Due  Topic Date Due   URINE MICROALBUMIN  Never done   Zoster Vaccines- Shingrix (1 of 2) Never done   COVID-19 Vaccine (3 - Pfizer risk series) 12/10/2019    There are no preventive care reminders to display for this patient.  Lab Results  Component Value Date   TSH 2.490 10/10/2020   Lab Results  Component Value Date   WBC 4.5 06/15/2021   HGB 14.3 06/15/2021   HCT 42.0 06/15/2021   MCV 95.4 06/15/2021   PLT 198 06/15/2021   Lab Results  Component Value Date   NA 138 06/15/2021   K 4.3 06/15/2021   CHLORIDE 100 01/17/2017   CO2 26 06/15/2021   GLUCOSE 99 06/15/2021   BUN 24 (H) 06/15/2021   CREATININE 1.10 06/15/2021   BILITOT 0.6 06/15/2021   ALKPHOS 102 06/15/2021   AST 24 06/15/2021   ALT 22 06/15/2021   PROT 7.2 06/15/2021   ALBUMIN 3.6 06/15/2021   CALCIUM 9.2 06/15/2021   ANIONGAP 9 06/15/2021   EGFR >60 09/01/2017  GFR 109.67 11/13/2015   Lab Results  Component Value Date   CHOL 161 10/10/2020   Lab Results  Component Value Date   HDL 74 10/10/2020   Lab Results  Component Value Date   LDLCALC 59 10/10/2020   Lab Results  Component Value Date   TRIG 175 (H) 10/10/2020   Lab Results  Component Value Date   CHOLHDL 2.2 10/10/2020   Lab Results  Component Value Date   HGBA1C 6.0 (H) 02/19/2021      Assessment & Plan:   #1 recent COVID-19 infection.  He had very mild symptoms.  Never went on antivirals.  Feels back to baseline at this time.  No  hypoxia.  -We produced letter for him to return to adult daycare program.  His risk for infectivity should be very low at this time  #2 chronic atrial fibrillation treated with Coumadin.  Rate controlled.  Continue close follow-up with Coumadin clinic  #3 history of diastolic heart failure.  No peripheral edema.  No dyspnea.  Continue to monitor.    No orders of the defined types were placed in this encounter.   Follow-up: No follow-ups on file.    Evelena Peat, MD

## 2021-08-24 ENCOUNTER — Ambulatory Visit: Payer: Medicare HMO | Admitting: Physician Assistant

## 2021-08-27 ENCOUNTER — Telehealth: Payer: Self-pay | Admitting: Family Medicine

## 2021-08-27 NOTE — Telephone Encounter (Signed)
error 

## 2021-08-28 ENCOUNTER — Other Ambulatory Visit: Payer: Self-pay | Admitting: Neurology

## 2021-09-04 ENCOUNTER — Ambulatory Visit: Payer: Medicare HMO | Admitting: Physician Assistant

## 2021-09-04 ENCOUNTER — Other Ambulatory Visit: Payer: Self-pay

## 2021-09-04 ENCOUNTER — Encounter: Payer: Self-pay | Admitting: Physician Assistant

## 2021-09-04 VITALS — BP 83/56 | HR 79 | Resp 18 | Ht 73.0 in | Wt 218.0 lb

## 2021-09-04 DIAGNOSIS — F03B18 Unspecified dementia, moderate, with other behavioral disturbance: Secondary | ICD-10-CM

## 2021-09-04 DIAGNOSIS — R69 Illness, unspecified: Secondary | ICD-10-CM | POA: Diagnosis not present

## 2021-09-04 DIAGNOSIS — G301 Alzheimer's disease with late onset: Secondary | ICD-10-CM | POA: Diagnosis not present

## 2021-09-04 DIAGNOSIS — F02818 Dementia in other diseases classified elsewhere, unspecified severity, with other behavioral disturbance: Secondary | ICD-10-CM | POA: Diagnosis not present

## 2021-09-04 MED ORDER — MEMANTINE HCL 10 MG PO TABS: 10.0000 mg | ORAL_TABLET | Freq: Two times a day (BID) | ORAL | 11 refills | Status: DC

## 2021-09-04 MED ORDER — DONEPEZIL HCL 10 MG PO TABS: 10.0000 mg | ORAL_TABLET | Freq: Every day | ORAL | 11 refills | Status: DC

## 2021-09-04 NOTE — Progress Notes (Signed)
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 Side effects were discussed  Continue Memantine 10 mg twice daily. Side effects were discussed  Continue attending the [facility] Discussed safety both in and out of the home.  Continue to monitor mood, and continue Paroxetine 20g daily  Follow up in 6 months.   Case discussed with Dr. Delice Lesch who agrees with the plan     Subjective:     Collin [name] is a 80 y.o. RH male with a history of PAF on [facility] with Coumadin, CDCHF,  NICM s/p AICD, cancer of the tongue s/p XRT 2019 with recent presentation to the hospital  for odontogenic cellulitis and subperiosteal abscess L maxilla, recent CoVID (08/09/2021)  and moderate dementia likely due to Alzheimer's disease seen today in follow up. Last MMSE 02/27/21 was 17/ 30. This patient is accompanied in the office by his wife Collin [name] who supplements the history.  Previous records as well as any outside records available were reviewed prior to todays visit.  Patient is currently on Donepezil 10 mg daily and Memantine 10 mg daily.   In today's visit, the patient reports having "a good day ".  His wife disagrees, she feels that his memory may be worse than prior.  He sounds sent a Christmas photo card with all the family members, and he did not recognize any of them, even asking "I did not know I had a son ".  He then began to cry realizing that he had memory deficits.  He also has some issues finding places and objects around the house.  His wife sent him to pick up the jacket in the closet and he has "what close it, I did not know we had a closet ".  Wife found him in the bathroom the other day, "with the pants down, putting the deodorant on the knees instead of on the axilla, I think he confused the deodorant for the pain medicine he puts in the knees ".  He is also beginning  to hoard more, "with piles of stuff, pieces of wood, napkins, plastic lids, chicken and [place] bones ".  No paranoia is reported.  His wife states that he no showed lifts to.  He has no insight in not taking things from the store.  His balance is worse, and now uses a walker at the adult day program to prevent from falling, after having a mechanical fall requiring 7 stitches in the head in October of this year.  At home he does not like to use it. He feels that attending the adult day program at [facility], has helped with his overall behavior, especially with his mood.  He goes there 5 times a week, Monday through Friday from 9-5.  There he is able to enjoy his favorite habits (making canes and pool poles), doing chair exercises, and "chatting with other adults ".  He states that while there, he is free to interact, and feels very much like himself.  His wife adds that he likes being the center of attention, and always has been, and "he shines there ".  She "feels that he has 2 personalities, because when he gets home, he is "grouchy again ".  At home, "he only likes to talk to the cat ".  He is on paroxetine 20 mg daily, sleeping well, but during his sleep, he talks  constantly.  He does not move as much as before.  He always had vivid dreams, no nightmares are reported.  He requires some assistance  They are in the process of moving today [place] with the family, this is likely to happen at the end of next year.  His appetite is good, but at [facility] is "great, because they serve [employer] so they help great breakfast and lunch ".  He has chronic dysphagia with mild aspiration risk for swallowing study in November 2021, but at the facility they he is feeling very small pieces and he is able to swallow without difficulty.   He denies any headaches, double vision.  He wears a hearing aid although he does not like it.  He denies any focal numbness or tingling, unilateral weakness or tremors.   He denies urine incontinence or retention.  Denies constipation or diarrhea.  He has an appointment with his cardiologist soon, to manage his blood pressure medications, which have been running on the lower side.      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 He started noticing changes around a year ago, soon after diagnosis of cancer. He describes his memory as "terrible," he would go upstairs and stop in the hallway forgetting what he went for. He could not find his eyeglasses and got very frustrated, berating himself for 40 minutes, calling himself stupid. His wife reports minor memory issues for a few years, but more noticeable in the past year. He used to manage his own medications, but at that time she would find full bottles of medications that should have been empty. She took over medications and now fixes his pills weekly. He states he does not drive as much and denies getting lost driving, his wife corrects him and reminds him he stopped driving November 8676 when he was having episodes with his ICD and was told not to drive. She feels he would have gotten lost, she always gives him instructions driving and he would panic if he did not know where he was. The other day at home he could not find their drinking glasses and was opening all the cabinets in the kitchen. He repeats stories and questions. He is independent with dressing and bathing, but his wife is never sure if he brushes his teeth, he gets mad if she reminds him. She states "he is a good actor." She reports he is friendly to everyone, talking to strangers, but once they get into the car, "he is a grump, angry old man," taking it out on her all the time. She reports  since the cancer diagnosis, things have been a lot worse, she thinks he is depressed. He states he is frustrated because he cannot do a lot of things he used to do.   He gets dizzy a lot when standing and has to get his bearings first. He has missteps and falls, he fell yesterday and had abrasions on his left arm. His vision is blurred. He has had swallowing difficulties since the surgery and radiation. He has a feeding tube but mostly is able to swallow by mouth now. No neck pain, some back discomfort. He gets left arm numbness when sitting for a long time. He has swelling on his left  ankle, his wife reports it is "collapsed" for years, but he has never been evaluated for it. He has constipation, no incontinence, no anosmia or tremors. His father had memory issues. No history of significant head injuries or alcohol use. He finished up to the 9th grade.   Diagnostic Data:  I personally reviewed head CT with and without contrast done July 2019 which did not show any acute changes, there was mild diffuse atrophy. TSH and B12 normal.    PREVIOUS MEDICATIONS:   CURRENT MEDICATIONS:  Outpatient Encounter Medications as of 09/04/2021  Medication Sig   acetaminophen (TYLENOL) 500 MG tablet Take 1,000 mg by mouth daily as needed for moderate pain.    carvedilol (COREG) 25 MG tablet TAKE 1 TABLET BY MOUTH TWO TIMES A DAY NEEDS APPOINTMENT   Cholecalciferol (VITAMIN D3) 50 MCG (2000 UT) TABS Take 1 tablet by mouth daily.   diltiazem (CARDIZEM) 90 MG tablet TAKE ONE TABLET BY MOUTH DAILY   PARoxetine (PAXIL) 20 MG tablet Take 1 tablet (20 mg total) by mouth daily.   polyethylene glycol (MIRALAX) packet Take 17 g by mouth daily as needed. Available OTC   potassium chloride (KLOR-CON) 10 MEQ tablet DISSOLVE 1 TABLET IN WATER AND ADMINISTER VIA PEG TUBE TAKE ONE TABLET BY MOUTH DAILY   rosuvastatin (CRESTOR) 10 MG tablet Take 1 tablet (10 mg total) by mouth daily.   torsemide (DEMADEX) 20 MG tablet TAKE ONE  AND ONE-HALF TABLETS (30 MG) BY MOUTH DAILY, ALTERNATING WITH 1 TABLET (20 MG) EVERY THIRD DAY   warfarin (COUMADIN) 5 MG tablet Take as instructed by the Coumadin clinic.   [DISCONTINUED] donepezil (ARICEPT) 10 MG tablet TAKE ONE TABLET BY MOUTH DAILY   [DISCONTINUED] memantine (NAMENDA) 10 MG tablet Take 10 mg by mouth 2 (two) times daily.   donepezil (ARICEPT) 10 MG tablet Take 1 tablet (10 mg total) by mouth daily.   memantine (NAMENDA) 10 MG tablet Take 1 tablet (10 mg total) by mouth 2 (two) times daily.   No facility-administered encounter medications on file as of 09/04/2021.     Objective:     PHYSICAL EXAMINATION:    VITALS:   Vitals:   09/04/21 0948  BP: (!) 83/56  Pulse: 79  Resp: 18  SpO2: 100%  Weight: 218 lb (98.9 kg)  Height: 6\' 1"  (1.854 m)     GEN:  The patient appears stated age and is in NAD. HEENT:  Normocephalic, atraumatic.   Neurological examination:  General: NAD, well-groomed, appears stated age. Orientation: The patient is alert. Oriented to person, [place], [place], not the office (physical place), Does not know the date Cranial nerves: There is good facial symmetry.The speech is fluent and clear. No aphasia or dysarthria. Fund of knowledge is reduced . Recent and remote memory are impaired. Attention and concentration are reduced.  Able to name objects and repeat phrases.  Hearing is intact to conversational tone.   Delayed recall 0/3 Sensation: Sensation is intact to light touch throughout Motor: Strength is at least antigravity x4.    Montreal Cognitive Assessment  04/04/2019  Visuospatial/ Executive (0/5) 1  Naming (0/3) 3  Attention: Read list of digits (0/2) 2  Attention: Read list of letters (0/1) 1  Attention: Serial 7 subtraction starting at 100 (0/3) 0  Language: Repeat phrase (0/2) 1  Language : Fluency (0/1) 0  Abstraction (0/2) 1  Delayed Recall (0/5) 4  Orientation (0/6) 1  Total 14  Adjusted Score (based on education) 15  MMSE - Mini Mental State Exam 02/26/2021 02/20/2020 09/22/2018  Orientation to time 2 1 2   Orientation to Place 2 2 4   Registration 3 3 3   Attention/ Calculation 0 0 0  Recall 2 1 2   Language- name 2 objects 2 2 2   Language- repeat 1 1 1   Language- follow 3 step command 3 2 3   Language- read & follow direction 1 1 1   Write a sentence 0 1 1  Copy design 1 1 1   Total score 17 15 20       Movement examination: Tone: There is normal tone in the UE/LE Abnormal movements:  no tremor.  No myoclonus.  No asterixis.   Coordination:  There is no decremation with RAM's. Normal finger to nose  Gait and Station: The patient has  difficulty arising out of a deep-seated chair without the use of the hands. He needs the cane, The patient's stride length is fair, Gait is cautious and narrow.     Total time spent on today's visit was 60 minutes, including both face-to-face time and nonface-to-face time. Time included that spent on review of records (prior notes available to me/labs/imaging if pertinent), discussing treatment and goals, answering patient's questions and coordinating care.  Cc:  Eulas Post, MD Sharene Butters, PA-C

## 2021-09-04 NOTE — Patient Instructions (Addendum)
It was a pleasure to see you today at our office.   Recommendations:  Follow up in  6 months Continue donepezil 10 mg daily   Continue Memantine 10 mg twice daily  Continue taking the antidepressant medication  RECOMMENDATIONS FOR ALL PATIENTS WITH MEMORY PROBLEMS: 1. Continue to exercise (Recommend 30 minutes of walking everyday, or 3 hours every week) 2. Increase social interactions - continue going to [place] and enjoy social gatherings with friends and family 3. Eat healthy, avoid fried foods and eat more fruits and vegetables 4. Maintain adequate blood pressure, blood sugar, and blood cholesterol level. Reducing the risk of stroke and cardiovascular disease also helps promoting better memory. 5. Avoid stressful situations. Live a simple life and avoid aggravations. Organize your time and prepare for the next day in anticipation. 6. Sleep well, avoid any interruptions of sleep and avoid any distractions in the bedroom that may interfere with adequate sleep quality 7. Avoid sugar, avoid sweets as there is a strong link between excessive sugar intake, diabetes, and cognitive impairment We discussed the Mediterranean diet, which has been shown to help patients reduce the risk of progressive memory disorders and reduces cardiovascular risk. This includes eating fish, eat fruits and green leafy vegetables, nuts like almonds and hazelnuts, walnuts, and also use olive oil. Avoid fast foods and fried foods as much as possible. Avoid sweets and sugar as sugar use has been linked to worsening of memory function.  There is always a concern of gradual progression of memory problems. If this is the case, then we may need to adjust level of care according to patient needs. Support, both to the patient and caregiver, should then be put into place.    FALL PRECAUTIONS: Be cautious when walking. Scan the area for obstacles that may increase the risk of trips and falls. When getting up in the mornings, sit up  at the edge of the bed for a few minutes before getting out of bed. Consider elevating the bed at the head end to avoid drop of blood pressure when getting up. Walk always in a well-lit room (use night lights in the walls). Avoid area rugs or power cords from appliances in the middle of the walkways. Use a walker or a cane if necessary and consider physical therapy for balance exercise. Get your eyesight checked regularly.  HOME SAFETY: Consider the safety of the kitchen when operating appliances like stoves, microwave oven, and blender. Consider having supervision and share cooking responsibilities until no longer able to participate in those. Accidents with firearms and other hazards in the house should be identified and addressed as well.   ABILITY TO BE LEFT ALONE: If patient is unable to contact 911 operator, consider using LifeLine, or when the need is there, arrange for someone to stay with patients. Smoking is a fire hazard, consider supervision or cessation. Risk of wandering should be assessed by caregiver and if detected at any point, supervision and safe proof recommendations should be instituted.  MEDICATION SUPERVISION: Inability to self-administer medication needs to be constantly addressed. Implement a mechanism to ensure safe administration of the medications.    Mediterranean Diet A Mediterranean diet refers to food and lifestyle choices that are based on the traditions of countries located on the [employer]. This way of eating has been shown to help prevent certain conditions and improve outcomes for people who have chronic diseases, like kidney disease and heart disease. What are tips for following this plan? Lifestyle  Cook and eat  meals together with your family, when possible. Drink enough fluid to keep your urine clear or pale yellow. Be physically active every day. This includes: Aerobic exercise like running or swimming. Leisure activities like gardening, walking,  or housework. Get 7-8 hours of sleep each night. If recommended by your health care provider, drink red wine in moderation. This means 1 glass a day for nonpregnant women and 2 glasses a day for men. A glass of wine equals 5 oz (150 mL). Reading food labels  Check the serving size of packaged foods. For foods such as rice and pasta, the serving size refers to the amount of cooked product, not dry. Check the total fat in packaged foods. Avoid foods that have saturated fat or trans fats. Check the ingredients list for added sugars, such as corn syrup. Shopping  At the grocery store, buy most of your food from the areas near the walls of the store. This includes: Fresh fruits and vegetables (produce). Grains, beans, nuts, and seeds. Some of these may be available in unpackaged forms or large amounts (in bulk). Fresh seafood. Poultry and eggs. Low-fat dairy products. Buy whole ingredients instead of prepackaged foods. Buy fresh fruits and vegetables in-season from local farmers markets. Buy frozen fruits and vegetables in resealable bags. If you do not have access to quality fresh seafood, buy precooked frozen shrimp or canned fish, such as tuna, salmon, or sardines. Buy small amounts of raw or cooked vegetables, salads, or olives from the deli or salad bar at your store. Stock your pantry so you always have certain foods on hand, such as olive oil, canned tuna, canned tomatoes, rice, pasta, and beans. Cooking  Cook foods with extra-virgin olive oil instead of using butter or other vegetable oils. Have meat as a side dish, and have vegetables or grains as your main dish. This means having meat in small portions or adding small amounts of meat to foods like pasta or stew. Use beans or vegetables instead of meat in common dishes like chili or lasagna. Experiment with different cooking methods. Try roasting or broiling vegetables instead of steaming or sauteing them. Add frozen vegetables to  soups, stews, pasta, or rice. Add nuts or seeds for added healthy fat at each meal. You can add these to yogurt, salads, or vegetable dishes. Marinate fish or vegetables using olive oil, lemon juice, garlic, and fresh herbs. Meal planning  Plan to eat 1 vegetarian meal one day each week. Try to work up to 2 vegetarian meals, if possible. Eat seafood 2 or more times a week. Have healthy snacks readily available, such as: Vegetable sticks with hummus. Greek yogurt. Fruit and nut trail mix. Eat balanced meals throughout the week. This includes: Fruit: 2-3 servings a day Vegetables: 4-5 servings a day Low-fat dairy: 2 servings a day Fish, poultry, or lean meat: 1 serving a day Beans and legumes: 2 or more servings a week Nuts and seeds: 1-2 servings a day Whole grains: 6-8 servings a day Extra-virgin olive oil: 3-4 servings a day Limit red meat and sweets to only a few servings a month What are my food choices? Mediterranean diet Recommended Grains: Whole-grain pasta. Brown rice. Bulgar wheat. Polenta. Couscous. Whole-wheat bread. [place]. Vegetables: Artichokes. Beets. Broccoli. Cabbage. Carrots. Eggplant. Green beans. Chard. Kale. Spinach. Onions. Leeks. Peas. Squash. Tomatoes. Peppers. Radishes. Fruits: Apples. Apricots. Avocado. Berries. Bananas. Cherries. Dates. Figs. Grapes. Lemons. Melon. Oranges. Peaches. Plums. Pomegranate. Meats and other protein foods: Beans. Almonds. Sunflower seeds. Pine nuts. Peanuts.  Cod. [place]. Scallops. Shrimp. [place]. Tilapia. Clams. Oysters. Eggs. Dairy: Low-fat milk. Cheese. Greek yogurt. Beverages: Water. Red wine. Herbal tea. Fats and oils: Extra virgin olive oil. Avocado oil. Grape seed oil. Sweets and desserts: [place] yogurt with honey. Baked apples. Poached pears. Trail mix. Seasoning and other foods: Basil. Cilantro. Coriander. Cumin. Mint. Parsley. Sage. Rosemary. Tarragon. Garlic. Oregano. Thyme. Pepper. Balsalmic vinegar. Tahini. Hummus.  Tomato sauce. Olives. Mushrooms. Limit these Grains: Prepackaged pasta or rice dishes. Prepackaged cereal with added sugar. Vegetables: Deep fried potatoes (french fries). Fruits: Fruit canned in syrup. Meats and other protein foods: Beef. Pork. Lamb. Poultry with skin. Hot dogs. Berniece Salines. Dairy: Ice cream. Sour cream. Whole milk. Beverages: Juice. Sugar-sweetened soft drinks. Beer. Liquor and spirits. Fats and oils: Butter. Canola oil. Vegetable oil. Beef fat (tallow). Lard. Sweets and desserts: Cookies. Cakes. Pies. Candy. Seasoning and other foods: Mayonnaise. Premade sauces and marinades. The items listed may not be a complete list. Talk with your dietitian about what dietary choices are right for you. Summary The Mediterranean diet includes both food and lifestyle choices. Eat a variety of fresh fruits and vegetables, beans, nuts, seeds, and whole grains. Limit the amount of red meat and sweets that you eat. Talk with your health care provider about whether it is safe for you to drink red wine in moderation. This means 1 glass a day for nonpregnant women and 2 glasses a day for men. A glass of wine equals 5 oz (150 mL). This information is not intended to replace advice given to you by your health care provider. Make sure you discuss any questions you have with your health care provider. Document Released: 04/15/2016 Document Revised: 05/18/2016 Document Reviewed: 04/15/2016 Elsevier Interactive Patient Education  2017 [employer].

## 2021-09-08 ENCOUNTER — Ambulatory Visit: Payer: Medicare HMO

## 2021-09-09 ENCOUNTER — Ambulatory Visit (INDEPENDENT_AMBULATORY_CARE_PROVIDER_SITE_OTHER): Payer: Medicare HMO

## 2021-09-09 DIAGNOSIS — Z7901 Long term (current) use of anticoagulants: Secondary | ICD-10-CM

## 2021-09-09 LAB — POCT INR: INR: 1.9 — AB (ref 2.0–3.0)

## 2021-09-09 NOTE — Patient Instructions (Addendum)
Pre visit review using our clinic review tool, if applicable. No additional management support is needed unless otherwise documented below in the visit note.  Increase dose today to take 1 1/2 tablets and then continue 1/2 tablet daily except take 1 tablet on Mon, Wed, and Fri.   Recheck in 6 wks.

## 2021-09-09 NOTE — Progress Notes (Signed)
Increase dose today to take 1 1/2 tablets and then continue 1/2 tablet daily except take 1 tablet on Mon, Wed, and Fri.   Recheck in 6 wks.

## 2021-09-11 ENCOUNTER — Other Ambulatory Visit: Payer: Self-pay | Admitting: Family Medicine

## 2021-09-15 ENCOUNTER — Ambulatory Visit (INDEPENDENT_AMBULATORY_CARE_PROVIDER_SITE_OTHER): Payer: Medicare HMO

## 2021-09-15 DIAGNOSIS — I428 Other cardiomyopathies: Secondary | ICD-10-CM

## 2021-09-15 LAB — CUP PACEART REMOTE DEVICE CHECK
Battery Remaining Longevity: 102 mo
Battery Remaining Percentage: 93 %
Brady Statistic RV Percent Paced: 24 %
Date Time Interrogation Session: 20230110051800
HighPow Impedance: 41 Ohm
Implantable Lead Implant Date: 20060209
Implantable Lead Location: 753860
Implantable Lead Model: 158
Implantable Lead Serial Number: 156422
Implantable Pulse Generator Implant Date: 20150904
Lead Channel Impedance Value: 361 Ohm
Lead Channel Pacing Threshold Amplitude: 1.1 V
Lead Channel Pacing Threshold Pulse Width: 0.6 ms
Lead Channel Setting Pacing Amplitude: 2.4 V
Lead Channel Setting Pacing Pulse Width: 0.6 ms
Lead Channel Setting Sensing Sensitivity: 0.5 mV
Pulse Gen Serial Number: 191056

## 2021-09-24 NOTE — Progress Notes (Signed)
Remote ICD transmission.   

## 2021-09-29 ENCOUNTER — Ambulatory Visit (INDEPENDENT_AMBULATORY_CARE_PROVIDER_SITE_OTHER): Payer: Medicare HMO

## 2021-09-29 DIAGNOSIS — I5032 Chronic diastolic (congestive) heart failure: Secondary | ICD-10-CM

## 2021-09-29 DIAGNOSIS — R413 Other amnesia: Secondary | ICD-10-CM

## 2021-09-29 DIAGNOSIS — I519 Heart disease, unspecified: Secondary | ICD-10-CM

## 2021-09-29 DIAGNOSIS — E782 Mixed hyperlipidemia: Secondary | ICD-10-CM

## 2021-09-29 DIAGNOSIS — I480 Paroxysmal atrial fibrillation: Secondary | ICD-10-CM

## 2021-09-29 NOTE — Patient Instructions (Signed)
Visit Information  Thank you for taking time to visit with me today. Please don't hesitate to contact me if I can be of assistance to you before our next scheduled telephone appointment.  Following are the goals we discussed today:  Take all medications as prescribed Attend all scheduled provider appointments Call pharmacy for medication refills 3-7 days in advance of running out of medications Call provider office for new concerns or questions  use salt in moderation watch for swelling in feet, ankles and legs every day develop a rescue plan eat more whole grains, fruits and vegetables, lean meats and healthy fats dress right for the weather, hot or cold check pulse (heart) rate once a day make a plan to eat healthy keep all lab appointments take medicine as prescribed call for medicine refill 2 or 3 days before it runs out take all medications exactly as prescribed call doctor with any symptoms you believe are related to your medicine  Our next appointment is by telephone on 11/24/21 at 11:30 AM  Please call the care guide team at [phone] if you need to cancel or reschedule your appointment.   If you are experiencing a Mental Health or [place] or need someone to talk to, please call the Suicide and Crisis Lifeline: 988 call the [place] National Suicide Prevention Lifeline: 240-779-5488 or TTY: (661)254-7823 TTY 219-332-0542) to talk to a trained counselor call 1-800-273-TALK (toll free, 24 hour hotline) go to [facility] [address] [place] [phone]) call 911   Patient verbalizes understanding of instructions and care plan provided today and agrees to view in [place]. Active MyChart status confirmed with patient.   Peter Garter RN, Jackquline Denmark, CDE Care Management Coordinator [facility] [phone], Mobile [phone]

## 2021-09-29 NOTE — Chronic Care Management (AMB) (Signed)
Chronic Care Management   CCM RN Visit Note  09/29/2021 Name: Collin [name] MRN: [id number] DOB: 07-15-1941  Subjective: Collin [name] is a 81 y.o. year old male who is a primary care patient of Burchette, Elberta Fortis, MD. The care management team was consulted for assistance with disease management and care coordination needs.    Engaged with patient by telephone for follow up visit in response to provider referral for case management and/or care coordination services.   Consent to Services:  The patient was given information about Chronic Care Management services, agreed to services, and gave verbal consent prior to initiation of services.  Please see initial visit note for detailed documentation.   Patient agreed to services and verbal consent obtained.   Assessment: Review of patient past medical history, allergies, medications, health status, including review of consultants reports, laboratory and other test data, was performed as part of comprehensive evaluation and provision of chronic care management services.   SDOH (Social Determinants of Health) assessments and interventions performed:    CCM Care Plan  Allergies  Allergen Reactions   Niacin Other (See Comments)   Niaspan [Niacin Er] Other (See Comments)    FLUSHED    Outpatient Encounter Medications as of 09/29/2021  Medication Sig   acetaminophen (TYLENOL) 500 MG tablet Take 1,000 mg by mouth daily as needed for moderate pain.    carvedilol (COREG) 25 MG tablet TAKE ONE TABLET BY MOUTH TWICE A DAY. *NEED APPOINTMENT*   Cholecalciferol (VITAMIN D3) 50 MCG (2000 UT) TABS Take 1 tablet by mouth daily.   diltiazem (CARDIZEM) 90 MG tablet TAKE ONE TABLET BY MOUTH DAILY   donepezil (ARICEPT) 10 MG tablet Take 1 tablet (10 mg total) by mouth daily.   memantine (NAMENDA) 10 MG tablet Take 1 tablet (10 mg total) by mouth 2 (two) times daily.   PARoxetine (PAXIL) 20 MG tablet Take 1 tablet (20 mg total) by mouth daily.    polyethylene glycol (MIRALAX) packet Take 17 g by mouth daily as needed. Available OTC   potassium chloride (KLOR-CON) 10 MEQ tablet DISSOLVE 1 TABLET IN WATER AND ADMINISTER VIA PEG TUBE TAKE ONE TABLET BY MOUTH DAILY   rosuvastatin (CRESTOR) 10 MG tablet Take 1 tablet (10 mg total) by mouth daily.   torsemide (DEMADEX) 20 MG tablet TAKE ONE AND ONE-HALF TABLETS (30 MG) BY MOUTH DAILY, ALTERNATING WITH 1 TABLET (20 MG) EVERY THIRD DAY   warfarin (COUMADIN) 5 MG tablet Take as instructed by the Coumadin clinic.   No facility-administered encounter medications on file as of 09/29/2021.    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) 09/04/2021   Memory loss 04/17/2021   Facial swelling 02/19/2021   Odontogenic infection of jaw 02/19/2021   Dental abscess 02/19/2021   Osteoarthritis of both hands 09/19/2019   Malnutrition of moderate degree 11/18/2017   Supratherapeutic INR 11/14/2017   AKI (acute kidney injury) (HCC) 11/12/2017   Vein disorder    Umbilical hernia    Shingles    History of radiation therapy    History of echocardiogram    Heart disease    Dysrhythmia    Claustrophobia    CHF (congestive heart failure) (HCC)    Cancer (HCC)    Atrial fibrillation (HCC)    AICD (automatic cardioverter/defibrillator) present    History of head and neck cancer 07/25/2017   Long term (current) use of anticoagulants 06/22/2017   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   Thrush, oral 12/21/2016   Weight loss, abnormal 12/21/2016   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   Umbilical hernia without obstruction and without gangrene    Pressure injury of skin 11/14/2016   Tongue mass: Per Ct neck 11/10/2016 11/12/2016   SBO (small bowel obstruction) (HCC) 11/11/2016   Encounter for therapeutic drug monitoring 10/25/2013   Diabetic peripheral neuropathy (HCC) 10/14/2011    Hypotension 08/20/2011   Acute renal failure (HCC) 08/20/2011   OSA (obstructive sleep apnea) 04/15/2011   ICD-[employer] 03/30/2011   Chronic diastolic CHF (congestive heart failure) (HCC) 03/30/2011   PAF (paroxysmal atrial fibrillation) (HCC) 12/14/2010   Nonischemic cardiomyopathy (HCC) 12/14/2010   Type 2 diabetes mellitus without complication, without long-term current use of insulin (HCC) 12/14/2010   Mixed hyperlipidemia 12/14/2010   Cardiomyopathy, nonischemic (HCC) 09/06/1996    Conditions to be addressed/monitored:Atrial Fibrillation, CHF, CAD, HLD, Dementia, and hx falls, Vtach  Care Plan : RN Care Manager Plan of Care  Updates made by Yetta Glassman, RN since 09/29/2021 12:00 AM     Problem: Chronic Disease Management and Care Coordination Needs (A fib, CAD, Vtach, CHF, HLD, dementia, falls)   Priority: High     Long-Range Goal: Establish Plan of Care for Chronic Disease Management Needs (A fib, CAD, Vtach, CHF, HLD, dementia, falls)   Start Date: 07/28/2021  Expected End Date: 01/24/2022  Priority: High  Note:   Current Barriers:  Care Coordination needs related to Memory Deficits Chronic Disease Management support and education needs related to Atrial Fibrillation, CHF, CAD, HLD, Dementia, and falls Wife states that pt has been doing good and has recovered from COVID last month.  States he is using his walker at the day center and has not had any falls.  States he is doing good at his memory day care and he likes going there.  States he is  more irritable when he is at home.  Denies any chest pains, swelling or shortness of breath. Reports pt is eating well and she tries to give him a healthy diet. Denies any bleeding or bruising   States she is coping with caring for pt at this time and she is involved with a caregiver support group that helps.  Continues to decline LCSW referral.  States she is working on Health and safety inspector and getting their home ready to sell.  States  she has not decided if [place] will stay [place]  RNCM Clinical Goal(s):  Patient will verbalize understanding of plan for management of Atrial Fibrillation, CHF, CAD, HLD, Dementia, and falls as evidenced by voiced adherence to plan of care verbalize basic understanding of  Atrial Fibrillation, CHF, CAD, HLD, Dementia, and falls disease process and self health management plan as evidenced by voiced understanding and teach back take all medications exactly as prescribed and will call provider for medication related questions as evidenced by dispense report and pt verbalization attend all scheduled medical appointments: Coumadin clinic 10/19/21, Neurology 03/03/22 as evidenced by medical records demonstrate Ongoing adherence to prescribed treatment plan for Atrial Fibrillation, CHF, CAD, HLD, Dementia, and falls  as evidenced by readings within limits and voices adherence of plan of care  through collaboration with RN Care manager, provider, and care team.   Interventions: 1:1 collaboration with primary care provider regarding development and update of comprehensive plan of care as evidenced by provider attestation and co-signature Inter-disciplinary care team collaboration (see longitudinal plan of  care) Evaluation of current treatment plan related to  self management and patient's adherence to plan as established by provider   AFIB Interventions: (Status:  Goal on track:  Yes.) Long Term Goal   Counseled on increased risk of stroke due to Afib and benefits of anticoagulation for stroke prevention Reviewed importance of adherence to anticoagulant exactly as prescribed Counseled on bleeding risk associated with Coumadin and importance of self-monitoring for signs/symptoms of bleeding Afib action plan reviewed Reviewed importance of fall safety when taking Coumadin   CAD Interventions: (Status:  Goal on track:  Yes.) Long Term Goal Assessed understanding of CAD  diagnosis Medications reviewed including medications utilized in CAD treatment plan Provided education on Importance of limiting foods high in cholesterol Reviewed Importance of taking all medications as prescribed Reviewed Importance of attending all scheduled provider appointments   Falls Interventions:  (Status:  Goal on track:  Yes.) Long Term Goal Reviewed medications and discussed potential side effects of medications such as dizziness and frequent urination Advised patient of importance of notifying provider of falls Assessed for falls since last encounter Assessed patients knowledge of fall risk prevention secondary to previously provided education Reviewed to try to have pt use walker when he is at home   Hyperlipidemia Interventions:  (Status:  Goal on track:  Yes.) Long Term Goal Medication review performed; medication list updated in electronic medical record.  Provider established cholesterol goals reviewed Counseled on importance of regular laboratory monitoring as prescribed Reviewed role and benefits of statin for ASCVD risk reduction  Dementia:  (Status:  Goal on track:  Yes.)  Long Term Goal Evaluation of current treatment plan related to misuse of: Alzheimer's dementia Reviewed medications including Namenda, Aricept, Emotional Support Provided to patient/caregiver, and Reviewed with caregiver to continue to attend support group      Heart Failure Interventions:  (Status:  Goal on track:  Yes.) Long Term Goal Reviewed Heart Failure Action Plan in depth and provided written copy Reviewed role of diuretics in prevention of fluid overload and management of heart failure; Discussed the importance of keeping all appointments with provider   Patient Goals/Self-Care Activities: Take all medications as prescribed Attend all scheduled provider appointments Call pharmacy for medication refills 3-7 days in advance of running out of medications Call provider office for new  concerns or questions  use salt in moderation watch for swelling in feet, ankles and legs every day develop a rescue plan eat more whole grains, fruits and vegetables, lean meats and healthy fats dress right for the weather, hot or cold check pulse (heart) rate once a day make a plan to eat healthy keep all lab appointments take medicine as prescribed call for medicine refill 2 or 3 days before it runs out take all medications exactly as prescribed call doctor with any symptoms you believe are related to your medicine Follow Up Plan:  Telephone follow up appointment with care management team member scheduled for:  11/24/21 The patient has been provided with contact information for the care management team and has been advised to call with any health related questions or concerns.        Plan:Telephone follow up appointment with care management team member scheduled for:  11/24/21 The patient has been provided with contact information for the care management team and has been advised to call with any health related questions or concerns.  Dudley Major RN, Maximiano Coss, CDE Care Management Coordinator [facility] [phone], Mobile [phone]

## 2021-10-06 DIAGNOSIS — I480 Paroxysmal atrial fibrillation: Secondary | ICD-10-CM

## 2021-10-06 DIAGNOSIS — E785 Hyperlipidemia, unspecified: Secondary | ICD-10-CM

## 2021-10-06 DIAGNOSIS — I5032 Chronic diastolic (congestive) heart failure: Secondary | ICD-10-CM | POA: Diagnosis not present

## 2021-10-16 ENCOUNTER — Other Ambulatory Visit: Payer: Self-pay | Admitting: Internal Medicine

## 2021-10-19 ENCOUNTER — Other Ambulatory Visit: Payer: Self-pay | Admitting: Internal Medicine

## 2021-10-19 ENCOUNTER — Ambulatory Visit (INDEPENDENT_AMBULATORY_CARE_PROVIDER_SITE_OTHER): Payer: Medicare HMO

## 2021-10-19 DIAGNOSIS — Z7901 Long term (current) use of anticoagulants: Secondary | ICD-10-CM

## 2021-10-19 LAB — POCT INR: INR: 2.1 (ref 2.0–3.0)

## 2021-10-19 NOTE — Progress Notes (Signed)
Continue 1/2 tablet daily except take 1 tablet on Mon, Wed, and Fri.   Recheck in 6 wks.

## 2021-10-19 NOTE — Patient Instructions (Addendum)
Pre visit review using our clinic review tool, if applicable. No additional management support is needed unless otherwise documented below in the visit note.  Continue 1/2 tablet daily except take 1 tablet on Mon, Wed, and Fri.   Recheck in 6 wks.

## 2021-10-23 ENCOUNTER — Other Ambulatory Visit: Payer: Self-pay | Admitting: Internal Medicine

## 2021-10-28 ENCOUNTER — Telehealth: Payer: Self-pay | Admitting: Family Medicine

## 2021-10-28 ENCOUNTER — Telehealth: Payer: Self-pay | Admitting: Internal Medicine

## 2021-10-28 DIAGNOSIS — Z7901 Long term (current) use of anticoagulants: Secondary | ICD-10-CM

## 2021-10-28 MED ORDER — WARFARIN SODIUM 5 MG PO TABS: ORAL_TABLET | ORAL | 1 refills | Status: DC

## 2021-10-28 NOTE — Telephone Encounter (Signed)
Patient spouse called in requesting to speak to someone because there was a mix-up with warfarin (COUMADIN) 5 MG tablet [[id number]] medication.  Please advise.

## 2021-10-28 NOTE — Telephone Encounter (Signed)
Pt's INR is manages by PCP Dr Elease Hashimoto. Called pt and explained that refill request should be sent to Dr Erick Blinks office. Verbalized understanding.

## 2021-10-28 NOTE — Telephone Encounter (Signed)
°*  STAT* If patient is at the pharmacy, call can be transferred to refill team.   1. Which medications need to be refilled? (please list name of each medication and dose if known)  warfarin (COUMADIN) 5 MG tablet  2. Which pharmacy/location (including street and city if local pharmacy) is medication to be sent to? [facility] [id number] - [place], [place]  3. Do they need a 30 day or 90 day supply? 30 with refills

## 2021-10-28 NOTE — Telephone Encounter (Signed)
Pt compliant with warfarin management and PCP apts. Sent in refill. 

## 2021-11-08 ENCOUNTER — Other Ambulatory Visit: Payer: Self-pay | Admitting: Neurology

## 2021-11-16 ENCOUNTER — Encounter: Payer: Self-pay | Admitting: Family Medicine

## 2021-11-16 DIAGNOSIS — M79673 Pain in unspecified foot: Secondary | ICD-10-CM

## 2021-11-21 ENCOUNTER — Other Ambulatory Visit: Payer: Self-pay | Admitting: Internal Medicine

## 2021-11-24 ENCOUNTER — Ambulatory Visit (INDEPENDENT_AMBULATORY_CARE_PROVIDER_SITE_OTHER): Payer: Medicare HMO

## 2021-11-24 DIAGNOSIS — I5032 Chronic diastolic (congestive) heart failure: Secondary | ICD-10-CM

## 2021-11-24 DIAGNOSIS — I519 Heart disease, unspecified: Secondary | ICD-10-CM

## 2021-11-24 DIAGNOSIS — R413 Other amnesia: Secondary | ICD-10-CM

## 2021-11-24 DIAGNOSIS — E782 Mixed hyperlipidemia: Secondary | ICD-10-CM

## 2021-11-24 DIAGNOSIS — R269 Unspecified abnormalities of gait and mobility: Secondary | ICD-10-CM

## 2021-11-24 DIAGNOSIS — I480 Paroxysmal atrial fibrillation: Secondary | ICD-10-CM

## 2021-11-24 NOTE — Patient Instructions (Signed)
Visit Information ? ?Thank you for taking time to visit with me today. Please don't hesitate to contact me if I can be of assistance to you before our next scheduled telephone appointment. ? ?Following are the goals we discussed today:  ?Take all medications as prescribed ?Attend all scheduled provider appointments ?Call pharmacy for medication refills 3-7 days in advance of running out of medications ?Call provider office for new concerns or questions  ?use salt in moderation ?watch for swelling in feet, ankles and legs every day ?develop a rescue plan ?eat more whole grains, fruits and vegetables, lean meats and healthy fats ?dress right for the weather, hot or cold ?check pulse (heart) rate once a day ?make a plan to eat healthy ?keep all lab appointments ?take medicine as prescribed ?call for medicine refill 2 or 3 days before it runs out ?take all medications exactly as prescribed ?call doctor with any symptoms you believe are related to your medicine ? ?Our next appointment is by telephone on 01/28/22 at 11:30 AM ? ?Please call the care guide team at [phone] if you need to cancel or reschedule your appointment.  ? ?If you are experiencing a Mental Health or [place] or need someone to talk to, please call the Suicide and Crisis Lifeline: 988 ?call the [place] National Suicide Prevention Lifeline: 440-550-4486 or TTY: 815-266-3837 TTY 5851840333) to talk to a trained counselor ?call 1-800-273-TALK (toll free, 24 hour hotline) ?go to [facility] [address] [place] [phone]) ?call 911  ? ?Patient verbalizes understanding of instructions and care plan provided today and agrees to view in [place]. Active MyChart status confirmed with patient.   ? ?Peter Garter RN, BSN,CCM, CDE ?Care Management Coordinator ?[facility] ?[phone]   ?

## 2021-11-24 NOTE — Chronic Care Management (AMB) (Signed)
?Chronic Care Management  ? ?CCM RN Visit Note ? ?11/24/2021 ?Name: Collin [name] MRN: [id number] DOB: 1941-01-08 ? ?Subjective: ?Collin [name] is a 81 y.o. year old male who is a primary care patient of Burchette, Collin Fortis, MD. The care management team was consulted for assistance with disease management and care coordination needs.   ? ?Engaged with patient by telephone for follow up visit in response to provider referral for case management and/or care coordination services.  ? ?Consent to Services:  ?The patient was given information about Chronic Care Management services, agreed to services, and gave verbal consent prior to initiation of services.  Please see initial visit note for detailed documentation.  ? ?Patient agreed to services and verbal consent obtained.  ? ?Assessment: Review of patient past medical history, allergies, medications, health status, including review of consultants reports, laboratory and other test data, was performed as part of comprehensive evaluation and provision of chronic care management services.  ? ?SDOH (Social Determinants of Health) assessments and interventions performed:   ? ?CCM Care Plan ? ?Allergies  ?Allergen Reactions  ? Niacin Other (See Comments)  ? Niaspan [Niacin Er] Other (See Comments)  ?  FLUSHED  ? ? ?Outpatient Encounter Medications as of 11/24/2021  ?Medication Sig  ? acetaminophen (TYLENOL) 500 MG tablet Take 1,000 mg by mouth daily as needed for moderate pain.   ? carvedilol (COREG) 25 MG tablet TAKE ONE TABLET BY MOUTH TWICE A DAY. *NEED APPOINTMENT*  ? Cholecalciferol (VITAMIN D3) 50 MCG (2000 UT) TABS Take 1 tablet by mouth daily.  ? diltiazem (CARDIZEM) 90 MG tablet TAKE ONE TABLET BY MOUTH DAILY MUST MAKE APPOINTMENT  ? donepezil (ARICEPT) 10 MG tablet Take 1 tablet (10 mg total) by mouth daily.  ? memantine (NAMENDA) 10 MG tablet Take 1 tablet (10 mg total) by mouth 2 (two) times daily.  ? PARoxetine (PAXIL) 20 MG tablet TAKE ONE TABLET BY MOUTH  DAILY  ? polyethylene glycol (MIRALAX) packet Take 17 g by mouth daily as needed. Available OTC  ? potassium chloride (KLOR-CON M) 10 MEQ tablet DISSOLVE 1 TABLET IN WATER AND ADMINISTER VIA PEG TUBE. TAKE ONE TABLET BY MOUTH DAILY.  ? rosuvastatin (CRESTOR) 10 MG tablet Take 1 tablet (10 mg total) by mouth daily.  ? torsemide (DEMADEX) 20 MG tablet TAKE ONE AND ONE-HALF TABLETS (30 MG) BY MOUTH DAILY, ALTERNATING WITH 1 TABLET (20 MG) EVERY THIRD DAY  ? warfarin (COUMADIN) 5 MG tablet TAKE 1/2 TABLET (2.5 MG) DAILY BY MOUTH EXCEPT TAKE 1 TABLET (5 MG) ON MONDAYS, WEDNESDAYS AND FRIDAYS OR AS DIRECTED BY ANTICOAGULATION CLINIC  ? ?No facility-administered encounter medications on file as of 11/24/2021.  ? ? ?Patient Active Problem List  ? Diagnosis Date Noted  ? 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 Memory loss 04/17/2021  ? Facial swelling 02/19/2021  ? Odontogenic infection of jaw 02/19/2021  ? Dental abscess 02/19/2021  ? Osteoarthritis of both hands 09/19/2019  ? Malnutrition of moderate degree 11/18/2017  ? Supratherapeutic INR 11/14/2017  ? AKI (acute kidney injury) (HCC) 11/12/2017  ? Vein disorder   ? Umbilical hernia   ? Shingles   ? History of radiation therapy   ? History of echocardiogram   ? Heart disease   ? Dysrhythmia   ? Claustrophobia   ? CHF (congestive heart failure) (HCC)   ? Cancer [facility])   ? Atrial fibrillation (HCC)   ? AICD (automatic cardioverter/defibrillator) present   ?  History of head and neck cancer 07/25/2017  ? Long term (current) use of anticoagulants 06/22/2017  ? 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  ? Thrush, oral 12/21/2016  ? Weight loss, abnormal 12/21/2016  ? 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  ? Umbilical hernia without obstruction and without gangrene   ? Pressure injury of skin 11/14/2016  ? Tongue mass: Per Ct neck 11/10/2016 11/12/2016  ? SBO (small bowel obstruction) (HCC) 11/11/2016  ?  Encounter for therapeutic drug monitoring 10/25/2013  ? Diabetic peripheral neuropathy (HCC) 10/14/2011  ? Hypotension 08/20/2011  ? Acute renal failure (HCC) 08/20/2011  ? OSA (obstructive sleep apnea) 04/15/2011  ? ICD-[employer] 03/30/2011  ? Chronic diastolic CHF (congestive heart failure) (HCC) 03/30/2011  ? PAF (paroxysmal atrial fibrillation) (HCC) 12/14/2010  ? Nonischemic cardiomyopathy (HCC) 12/14/2010  ? Type 2 diabetes mellitus without complication, without long-term current use of insulin (HCC) 12/14/2010  ? Mixed hyperlipidemia 12/14/2010  ? Cardiomyopathy, nonischemic (HCC) 09/06/1996  ? ? ?Conditions to be addressed/monitored:CHF, CAD, HLD, Dementia, and hx falls ? ?Care Plan : RN Care Manager Plan of Care  ?Updates made by Yetta Glassman, RN since 11/24/2021 12:00 AM  ?  ? ?Problem: Chronic Disease Management and Care Coordination Needs (A fib, CAD, Vtach, CHF, HLD, dementia, falls)   ?Priority: High  ?  ? ?Long-Range Goal: Establish Plan of Care for Chronic Disease Management Needs (A fib, CAD, Vtach, CHF, HLD, dementia, falls)   ?Start Date: 07/28/2021  ?Expected End Date: 05/23/2022  ?Priority: High  ?Note:   ?Current Barriers:  ?Care Coordination needs related to Memory Deficits ?Chronic Disease Management support and education needs related to Atrial Fibrillation, CHF, CAD, HLD, Dementia, and falls ?Wife states that pt has been doing good and.  States he is using his walker at the day center and has not had any falls.  States he is doing good at his memory day care and he likes going there.   Denies any chest pains, swelling or shortness of breath. Reports pt is eating well and she tries to give him a healthy diet. Denies any bleeding or bruising   States she is coping with caring for pt at this time and she is involved with a caregiver support group that helps.  Continues to decline LCSW referral.  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   [place] her son is going to move. ? ?RNCM Clinical Goal(s):  ?Patient will verbalize understanding of plan for management of Atrial Fibrillation, CHF, CAD, HLD, Dementia, and falls as evidenced by voiced adherence to plan of care ?verbalize basic understanding of  Atrial Fibrillation, CHF, CAD, HLD, Dementia, and falls disease process and self health management plan as evidenced by voiced understanding and teach back ?take all medications exactly as prescribed and will call provider for medication related questions as evidenced by dispense report and pt verbalization ?attend all scheduled medical appointments: Coumadin clinic 10/19/21, Neurology 03/03/22 as evidenced by medical records ?demonstrate Ongoing adherence to prescribed treatment plan for Atrial Fibrillation, CHF, CAD, HLD, Dementia, and falls  as evidenced by readings within limits and voices adherence of plan of care  through collaboration with RN Care manager, provider, and care team.  ? ?Interventions: ?1:1 collaboration with primary care provider regarding development and update of comprehensive plan of care as evidenced by provider attestation and co-signature ?Inter-disciplinary care team collaboration (see longitudinal  plan of care) ?Evaluation of current treatment plan related to  self management and patient's adherence to plan as established by provider ? ? ?AFIB Interventions: (Status:  Goal on track:  Yes.) Long Term Goal ?  Counseled on increased risk of stroke due to Afib and benefits of anticoagulation for stroke prevention ?Reviewed importance of adherence to anticoagulant exactly as prescribed ?Counseled on bleeding risk associated with Coumadin and importance of self-monitoring for signs/symptoms of bleeding ?Afib action plan reviewed ?Reinforced importance of fall safety when taking Coumadin ? ? ?CAD Interventions: (Status:  Goal on track:  Yes.) Long Term Goal ?Assessed  understanding of CAD diagnosis ?Medications reviewed including medications utilized in CAD treatment plan ?Provided education on Importance of limiting foods high in cholesterol ?Reviewed Importance of taking all

## 2021-11-27 ENCOUNTER — Other Ambulatory Visit: Payer: Self-pay

## 2021-11-27 ENCOUNTER — Ambulatory Visit: Payer: Medicare HMO | Admitting: Podiatry

## 2021-11-27 ENCOUNTER — Ambulatory Visit (INDEPENDENT_AMBULATORY_CARE_PROVIDER_SITE_OTHER): Payer: Medicare HMO

## 2021-11-27 DIAGNOSIS — M2141 Flat foot [pes planus] (acquired), right foot: Secondary | ICD-10-CM | POA: Diagnosis not present

## 2021-11-27 DIAGNOSIS — E1149 Type 2 diabetes mellitus with other diabetic neurological complication: Secondary | ICD-10-CM | POA: Diagnosis not present

## 2021-11-27 DIAGNOSIS — L84 Corns and callosities: Secondary | ICD-10-CM | POA: Diagnosis not present

## 2021-11-27 DIAGNOSIS — Z7901 Long term (current) use of anticoagulants: Secondary | ICD-10-CM | POA: Diagnosis not present

## 2021-11-27 DIAGNOSIS — B351 Tinea unguium: Secondary | ICD-10-CM

## 2021-11-27 DIAGNOSIS — M19072 Primary osteoarthritis, left ankle and foot: Secondary | ICD-10-CM

## 2021-11-27 DIAGNOSIS — M21619 Bunion of unspecified foot: Secondary | ICD-10-CM | POA: Diagnosis not present

## 2021-11-27 DIAGNOSIS — M2142 Flat foot [pes planus] (acquired), left foot: Secondary | ICD-10-CM | POA: Diagnosis not present

## 2021-11-27 DIAGNOSIS — M79672 Pain in left foot: Secondary | ICD-10-CM

## 2021-11-27 MED ORDER — AMMONIUM LACTATE 12 % EX LOTN: 1.0000 "application " | TOPICAL_LOTION | CUTANEOUS | 0 refills | Status: DC | PRN

## 2021-11-27 NOTE — Progress Notes (Signed)
Subjective:  ? ?Patient ID: Stevie Kern, male   DOB: 81 y.o.   MRN: [id number]  ? ?HPI ?81 year old male presents the office today for concerns of left foot pain.  He gets dry skin and also gets a callus on his left foot.  His foot has been rolling inwards his wife states that he has a flatfoot.  No recent injury or trauma.  On flexible nails be trimmed dystrophic elongated nails difficulty trimming himself. ? ?History of type 2 diabetes with neuropathy ? ?He is on coumadin  ? ?Review of Systems  ?All other systems reviewed and are negative. ? ?Past Medical History:  ?Diagnosis Date  ? AICD (automatic cardioverter/defibrillator) present   ? Atrial fibrillation ([place])   ? Cancer [facility])   ? tongue cancer  ? Cardiomyopathy, nonischemic ([place]) 1998  ? CHF (congestive heart failure) ([place])   ? Claustrophobia   ? Diabetes mellitus   ? Dysrhythmia   ? Heart disease   ? History of echocardiogram   ? Echo 8/18:  EF 55-60, no RWMA, severe LAE, normal RVSF, mild RAE  ? History of radiation therapy 12/07/16- 01/24/17  ? Base of Tongue 70 Gy in 35 fractions  ? Hyperlipidemia   ? Memory loss   ? OSA (obstructive sleep apnea)   ? Shingles   ? Umbilical hernia   ? Vein disorder   ? 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 It sometimes shows as a shadow on scans.   ? ? ?Past Surgical History:  ?Procedure Laterality Date  ? CARDIAC DEFIBRILLATOR PLACEMENT  2006  ? CATARACT EXTRACTION Bilateral   ? 01/07/2016. 01/22/2016  ? CYST EXCISION    ? multiple drainage to a cyst in his neck.   ? DIRECT LARYNGOSCOPY N/A 11/16/2016  ? Procedure: DIRECT LARYNGOSCOPY AND BIOPSY;  Surgeon: Rozetta Nunnery, MD;  Location: [place];  Service: ENT;  Laterality: N/A;  ? EYE SURGERY    ? HERNIA REPAIR  1950s  ? IMPLANTABLE CARDIOVERTER DEFIBRILLATOR (ICD) GENERATOR CHANGE N/A 05/10/2014  ? Procedure: ICD GENERATOR CHANGE;  Surgeon: [name], MD;  Location: [facility] CATH LAB;  Service: Cardiovascular;  Laterality: N/A;  ?  INSERTION OF MESH N/A 01/18/2018  ? Procedure: INSERTION OF MESH;  Surgeon: Jovita Kussmaul, MD;  Location: [place];  Service: General;  Laterality: N/A;  ? IR GASTROSTOMY TUBE MOD SED  12/27/2016  ? IR GASTROSTOMY TUBE REMOVAL  03/24/2018  ? IR PATIENT EVAL TECH 0-60 MINS  01/17/2017  ? MASS EXCISION Right 11/16/2016  ? Procedure: EXCISION RIGHT NECK LIPOMA;  Surgeon: Rozetta Nunnery, MD;  Location: [place];  Service: ENT;  Laterality: Right;  ? MOHS SURGERY  01/01/2015  ? to top of head  ? MOUTH SURGERY    ? teeth removed 4 teeth extracted 02/2010  ? RADICAL NECK DISSECTION Right 07/25/2017  ? Procedure: RIGHT NECK DISSECTION;  Surgeon: Rozetta Nunnery, MD;  Location: [place];  Service: ENT;  Laterality: Right;  ? UMBILICAL HERNIA REPAIR  01/18/2018  ? w/mesh  ? UMBILICAL HERNIA REPAIR N/A 01/18/2018  ? Procedure: UMBILICAL HERNIA REPAIR WITH MESH;  Surgeon: Jovita Kussmaul, MD;  Location: [place];  Service: General;  Laterality: N/A;  ? ? ? ?Current Outpatient Medications:  ?  ammonium lactate (AMLACTIN) 12 % lotion, Apply 1 application. topically as needed for dry skin., Disp: 400 g, Rfl: 0 ?  acetaminophen (TYLENOL) 500 MG tablet, Take  1,000 mg by mouth daily as needed for moderate pain. , Disp: , Rfl:  ?  carvedilol (COREG) 25 MG tablet, TAKE ONE TABLET BY MOUTH TWICE A DAY. *NEED APPOINTMENT*, Disp: 180 tablet, Rfl: 2 ?  Cholecalciferol (VITAMIN D3) 50 MCG (2000 UT) TABS, Take 1 tablet by mouth daily., Disp: , Rfl:  ?  diltiazem (CARDIZEM) 90 MG tablet, TAKE ONE TABLET BY MOUTH DAILY MUST MAKE APPOINTMENT, Disp: 15 tablet, Rfl: 0 ?  donepezil (ARICEPT) 10 MG tablet, Take 1 tablet (10 mg total) by mouth daily., Disp: 30 tablet, Rfl: 11 ?  memantine (NAMENDA) 10 MG tablet, Take 1 tablet (10 mg total) by mouth 2 (two) times daily., Disp: 60 tablet, Rfl: 11 ?  PARoxetine (PAXIL) 20 MG tablet, TAKE ONE TABLET BY MOUTH DAILY, Disp: 90 tablet, Rfl: 0 ?  polyethylene glycol (MIRALAX) packet, Take 17 g by mouth daily as  needed. Available OTC, Disp: 30 each, Rfl: 0 ?  potassium chloride (KLOR-CON M) 10 MEQ tablet, DISSOLVE 1 TABLET IN WATER AND ADMINISTER VIA PEG TUBE. TAKE ONE TABLET BY MOUTH DAILY., Disp: 90 tablet, Rfl: 2 ?  rosuvastatin (CRESTOR) 10 MG tablet, Take 1 tablet (10 mg total) by mouth daily., Disp: 90 tablet, Rfl: 2 ?  torsemide (DEMADEX) 20 MG tablet, TAKE ONE AND ONE-HALF TABLETS (30 MG) BY MOUTH DAILY, ALTERNATING WITH 1 TABLET (20 MG) EVERY THIRD DAY, Disp: 180 tablet, Rfl: 3 ?  warfarin (COUMADIN) 5 MG tablet, TAKE 1/2 TABLET (2.5 MG) DAILY BY MOUTH EXCEPT TAKE 1 TABLET (5 MG) ON MONDAYS, WEDNESDAYS AND FRIDAYS OR AS DIRECTED BY ANTICOAGULATION CLINIC, Disp: 90 tablet, Rfl: 1 ? ?Allergies  ?Allergen Reactions  ? Niacin Other (See Comments)  ? Niaspan [Niacin Er] Other (See Comments)  ?  FLUSHED  ? ? ? ? ? ?   ?Objective:  ?Physical Exam  ?General: AAO x3, NAD ? ?Dermatological: Nails are hypertrophic, dystrophic, brittle, discolored, elongated ?10. No surrounding redness or drainage. Tenderness nails 1-5 bilaterally.  Hyperkeratotic lesion submetatarsal 1 left foot without any underlying ulceration drainage or signs of infection.  No open lesions or pre-ulcerative lesions are identified today. ? ?Vascular: Dorsalis Pedis artery and Posterior Tibial artery pedal pulses are palpable bilateral with immedate capillary fill time. Pedal hair growth present. No varicosities and no lower extremity edema present bilateral. There is no pain with calf compression, swelling, warmth, erythema.  ? ?Neruologic: Sensation intact with Semmes Weinstein monofilament.  Previous been diagnosed with neuropathy ? ?Musculoskeletal: Flatfoot is present bilaterally with prominent metatarsal head plantarly with atrophy of fat pad.  No area discomfort noted otherwise.  Muscular strength 5/5 in all groups tested bilateral. ? ?Gait: Unassisted, Nonantalgic.  ? ? ?   ?Assessment:  ? ?81 year old male with symptomatic onychosis, hyperkeratotic  lesion with history of type 2 diabetes with neuropathy, flat feet ? ?   ?Plan:  ?-Treatment options discussed including all alternatives, risks, and complications ?-Etiology of symptoms were discussed ?-X-rays were obtained and reviewed with the patient.  3 views of the left foot were obtained.  No evidence of acute fracture or stress fracture noted.  Arthritic changes present to the foot.  Decreased calcaneal inclination angle. ?-Sharp debrided the hyperkeratotic lesion x1 without any complications or bleeding ?-Sharply debrided the nails x10 without any complications or bleeding ?-Continued shoes and inserts will be beneficial for him.  Given his history of type 2 diabetes with neuropathy with flatfeet prominent metatarsal heads, try to get diabetic shoes with good inserts for  offloading for him. ?-Daily foot inspection ? ?   ?Trula Slade DPM ? ?

## 2021-11-30 ENCOUNTER — Ambulatory Visit (INDEPENDENT_AMBULATORY_CARE_PROVIDER_SITE_OTHER): Payer: Medicare HMO

## 2021-11-30 DIAGNOSIS — Z7901 Long term (current) use of anticoagulants: Secondary | ICD-10-CM | POA: Diagnosis not present

## 2021-11-30 LAB — POCT INR: INR: 2 (ref 2.0–3.0)

## 2021-11-30 NOTE — Progress Notes (Signed)
Continue 1/2 tablet daily except take 1 tablet on Mon, Wed, and Fri.   Recheck in 6 wks.   ?

## 2021-11-30 NOTE — Patient Instructions (Addendum)
Pre visit review using our clinic review tool, if applicable. No additional management support is needed unless otherwise documented below in the visit note. ? ?Continue 1/2 tablet daily except take 1 tablet on Mon, Wed, and Fri.   Recheck in 6 wks.   ?

## 2021-12-02 ENCOUNTER — Telehealth: Payer: Self-pay | Admitting: Family Medicine

## 2021-12-02 NOTE — Telephone Encounter (Signed)
Left message for patient to call back and schedule Medicare Annual Wellness Visit (AWV) either virtually or in office. Left  my Herbie Drape number [phone] ? ? ?AWV-I per [place] 11/05/11 ?please schedule at anytime with [facility] Nurse Health Advisor 1 or 2 ? ? ?I WANTED TO SEE IF PT COULD DO AWV AFTER HIS COUMADIN APPT ON 01/11/22 ?

## 2021-12-04 ENCOUNTER — Ambulatory Visit: Payer: Medicare HMO

## 2021-12-04 ENCOUNTER — Other Ambulatory Visit: Payer: Self-pay | Admitting: Internal Medicine

## 2021-12-04 DIAGNOSIS — I4891 Unspecified atrial fibrillation: Secondary | ICD-10-CM | POA: Diagnosis not present

## 2021-12-04 DIAGNOSIS — F0390 Unspecified dementia without behavioral disturbance: Secondary | ICD-10-CM

## 2021-12-04 DIAGNOSIS — M2141 Flat foot [pes planus] (acquired), right foot: Secondary | ICD-10-CM

## 2021-12-04 DIAGNOSIS — M21619 Bunion of unspecified foot: Secondary | ICD-10-CM

## 2021-12-04 DIAGNOSIS — E785 Hyperlipidemia, unspecified: Secondary | ICD-10-CM

## 2021-12-04 DIAGNOSIS — I509 Heart failure, unspecified: Secondary | ICD-10-CM | POA: Diagnosis not present

## 2021-12-04 DIAGNOSIS — L84 Corns and callosities: Secondary | ICD-10-CM

## 2021-12-04 DIAGNOSIS — E1149 Type 2 diabetes mellitus with other diabetic neurological complication: Secondary | ICD-10-CM

## 2021-12-04 DIAGNOSIS — R69 Illness, unspecified: Secondary | ICD-10-CM | POA: Diagnosis not present

## 2021-12-04 NOTE — Progress Notes (Signed)
SITUATION ?Reason for Consult: Evaluation for Prefabricated Diabetic Shoes and Custom Diabetic Inserts. ?Patient / Caregiver Report: Patient would like well fitting shoes ? ?OBJECTIVE DATA: ?Patient History / Diagnosis:  ?  ICD-10-CM   ?1. Type II diabetes mellitus with neurological manifestations ([place])  E11.49   ?  ?2. Pes planus of both feet  M21.41   ? M21.42   ?  ?3. Bunion  M21.619   ?  ?4. Callus  L84   ?  ? ? ?Physician Treating Diabetes:  Eulas Post, MD ? ?Current or Previous Devices:   None and no history ? ?In-Person Foot Examination: ?Ulcers & Callousing:   Various ?Deformities:    Pes Planus, Hallux Valgus ?Sensation:    Compromised  ?Shoe Size:     14XW ? ?ORTHOTIC RECOMMENDATION ?Recommended Devices: ?- 1x pair prefabricated PDAC approved diabetic shoes; Patient Selected Orthofeet Sprint 672 Grey Size 14XW ?- 3x pair custom-to-patient PDAC approved vacuum formed diabetic insoles. ? ?GOALS OF SHOES AND INSOLES ?- Reduce shear and pressure ?- Reduce / Prevent callus formation ?- Reduce / Prevent ulceration ?- Protect the fragile healing compromised diabetic foot. ? ?Patient would benefit from diabetic shoes and inserts as patient has diabetes mellitus and the patient has one or more of the following conditions: ?- History of partial or complete amputation of the foot ?- History of previous foot ulceration. ?- History of pre-ulcerative callus ?- Peripheral neuropathy with evidence of callus formation ?- Foot deformity ?- Poor circulation ? ?ACTIONS PERFORMED ?Potential out of pocket cost was communicated to patient. Patient understood and consented to measurement and casting. Patient was casted for insoles via crush box and measured for shoes via brannock device. Procedure was explained and patient tolerated procedure well. All questions were answered and concerns addressed. Casts were shipped to central fabrication for HOLD until Certificate of Medical Necessity or otherwise necessary  authorization from insurance is obtained. ? ?PLAN ?Shoes are to be ordered and casts released from hold once all appropriate paperwork is complete. Patient is to be contacted and scheduled for fitting once shoes and insoles have been fabricated and received. ? ?

## 2021-12-05 ENCOUNTER — Other Ambulatory Visit: Payer: Self-pay | Admitting: Podiatry

## 2021-12-05 DIAGNOSIS — M19072 Primary osteoarthritis, left ankle and foot: Secondary | ICD-10-CM

## 2021-12-10 ENCOUNTER — Ambulatory Visit: Payer: Medicare HMO

## 2021-12-15 ENCOUNTER — Ambulatory Visit (INDEPENDENT_AMBULATORY_CARE_PROVIDER_SITE_OTHER): Payer: Medicare HMO

## 2021-12-15 DIAGNOSIS — I428 Other cardiomyopathies: Secondary | ICD-10-CM | POA: Diagnosis not present

## 2021-12-15 LAB — CUP PACEART REMOTE DEVICE CHECK
Battery Remaining Longevity: 102 mo
Battery Remaining Percentage: 88 %
Brady Statistic RV Percent Paced: 23 %
Date Time Interrogation Session: 20230411081900
HighPow Impedance: 39 Ohm
Implantable Lead Implant Date: 20060209
Implantable Lead Location: 753860
Implantable Lead Model: 158
Implantable Lead Serial Number: 156422
Implantable Pulse Generator Implant Date: 20150904
Lead Channel Impedance Value: 356 Ohm
Lead Channel Pacing Threshold Amplitude: 1.1 V
Lead Channel Pacing Threshold Pulse Width: 0.6 ms
Lead Channel Setting Pacing Amplitude: 2.4 V
Lead Channel Setting Pacing Pulse Width: 0.6 ms
Lead Channel Setting Sensing Sensitivity: 0.5 mV
Pulse Gen Serial Number: 191056

## 2021-12-18 ENCOUNTER — Other Ambulatory Visit: Payer: Self-pay | Admitting: Internal Medicine

## 2021-12-18 ENCOUNTER — Ambulatory Visit (INDEPENDENT_AMBULATORY_CARE_PROVIDER_SITE_OTHER): Payer: Medicare HMO

## 2021-12-18 VITALS — BP 138/64 | HR 74 | Temp 98.4°F | Ht 73.0 in | Wt 217.0 lb

## 2021-12-18 DIAGNOSIS — Z0000 Encounter for general adult medical examination without abnormal findings: Secondary | ICD-10-CM | POA: Diagnosis not present

## 2021-12-18 NOTE — Progress Notes (Addendum)
? ?Subjective:  ? Collin [name] is a 81 y.o. male who presents for Medicare Annual/Subsequent preventive examination. ? ?Review of Systems    ? ?   ?Objective:  ?  ?Today's Vitals  ? 12/18/21 0828 12/18/21 0830  ?BP: 138/64   ?Pulse: 74   ?Temp: 98.4 ?F (36.9 ?C)   ?SpO2: 96%   ?Weight: 217 lb (98.4 kg)   ?Height: '6\' 1"'$  (1.854 m)   ?PainSc:  0-No pain  ? ?Body mass index is 28.63 kg/m?. ? ? ?  12/18/2021  ?  8:47 AM 09/04/2021  ?  9:48 AM 06/23/2021  ?  3:27 PM 02/26/2021  ?  2:20 PM 02/18/2021  ? 10:07 PM 09/22/2020  ?  3:06 PM 02/20/2020  ?  2:41 PM  ?Advanced Directives  ?Does Patient Have a Medical Advance Directive? Yes Yes Yes Yes No Yes Yes  ?Type of Paramedic of [place];Living will  [place];Living will Healthcare Power of [place];Living will;Out of facility DNR (pink MOST or yellow form) [place];Living will  ?Does patient want to make changes to medical advance directive? No - Patient declined  No - Patient declined      ?Copy of [place] in Chart? No - copy requested  No - copy requested      ? ? ?Current Medications (verified) ?Outpatient Encounter Medications as of 12/18/2021  ?Medication Sig  ? acetaminophen (TYLENOL) 500 MG tablet Take 1,000 mg by mouth daily as needed for moderate pain.   ? ammonium lactate (AMLACTIN) 12 % lotion Apply 1 application. topically as needed for dry skin.  ? carvedilol (COREG) 25 MG tablet TAKE ONE TABLET BY MOUTH TWICE A DAY. *NEED APPOINTMENT*  ? Cholecalciferol (VITAMIN D3) 50 MCG (2000 UT) TABS Take 1 tablet by mouth daily.  ? diltiazem (CARDIZEM) 90 MG tablet TAKE ONE TABLET BY MOUTH DAILY *MUST MAKE AN APPOINTMENT  ? donepezil (ARICEPT) 10 MG tablet Take 1 tablet (10 mg total) by mouth daily.  ? memantine (NAMENDA) 10 MG tablet Take 1 tablet (10 mg total) by mouth 2 (two) times daily.  ? PARoxetine (PAXIL) 20 MG tablet TAKE ONE TABLET BY MOUTH DAILY  ?  polyethylene glycol (MIRALAX) packet Take 17 g by mouth daily as needed. Available OTC  ? potassium chloride (KLOR-CON M) 10 MEQ tablet DISSOLVE 1 TABLET IN WATER AND ADMINISTER VIA PEG TUBE. TAKE ONE TABLET BY MOUTH DAILY.  ? rosuvastatin (CRESTOR) 10 MG tablet Take 1 tablet (10 mg total) by mouth daily.  ? torsemide (DEMADEX) 20 MG tablet TAKE ONE AND ONE-HALF TABLETS (30 MG) BY MOUTH DAILY, ALTERNATING WITH 1 TABLET (20 MG) EVERY THIRD DAY  ? warfarin (COUMADIN) 5 MG tablet TAKE 1/2 TABLET (2.5 MG) DAILY BY MOUTH EXCEPT TAKE 1 TABLET (5 MG) ON MONDAYS, WEDNESDAYS AND FRIDAYS OR AS DIRECTED BY ANTICOAGULATION CLINIC  ? ?No facility-administered encounter medications on file as of 12/18/2021.  ? ? ?Allergies (verified) ?Niacin and Niaspan [niacin er]  ? ?History: ?Past Medical History:  ?Diagnosis Date  ? AICD (automatic cardioverter/defibrillator) present   ? Atrial fibrillation ([place])   ? Cancer [facility])   ? tongue cancer  ? 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 CHF (congestive heart failure) ([place])   ? Claustrophobia   ? Diabetes mellitus   ? Dysrhythmia   ? Heart disease   ? History of echocardiogram   ? Echo 8/18:  EF 55-60, no RWMA,  severe LAE, normal RVSF, mild RAE  ? History of radiation therapy 12/07/16- 01/24/17  ? Base of Tongue 70 Gy in 35 fractions  ? Hyperlipidemia   ? Memory loss   ? OSA (obstructive sleep apnea)   ? Shingles   ? Umbilical hernia   ? Vein disorder   ? 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 It sometimes shows as a shadow on scans.   ? ?Past Surgical History:  ?Procedure Laterality Date  ? CARDIAC DEFIBRILLATOR PLACEMENT  2006  ? CATARACT EXTRACTION Bilateral   ? 01/07/2016. 01/22/2016  ? CYST EXCISION    ? multiple drainage to a cyst in his neck.   ? DIRECT LARYNGOSCOPY N/A 11/16/2016  ? Procedure: DIRECT LARYNGOSCOPY AND BIOPSY;  Surgeon: Rozetta Nunnery, MD;  Location: [place];  Service: ENT;  Laterality: N/A;  ? EYE SURGERY    ? HERNIA REPAIR  1950s   ? IMPLANTABLE CARDIOVERTER DEFIBRILLATOR (ICD) GENERATOR CHANGE N/A 05/10/2014  ? Procedure: ICD GENERATOR CHANGE;  Surgeon: [name], MD;  Location: [facility] CATH LAB;  Service: Cardiovascular;  Laterality: N/A;  ? INSERTION OF MESH N/A 01/18/2018  ? Procedure: INSERTION OF MESH;  Surgeon: Jovita Kussmaul, MD;  Location: [place];  Service: General;  Laterality: N/A;  ? IR GASTROSTOMY TUBE MOD SED  12/27/2016  ? IR GASTROSTOMY TUBE REMOVAL  03/24/2018  ? IR PATIENT EVAL TECH 0-60 MINS  01/17/2017  ? MASS EXCISION Right 11/16/2016  ? Procedure: EXCISION RIGHT NECK LIPOMA;  Surgeon: Rozetta Nunnery, MD;  Location: [place];  Service: ENT;  Laterality: Right;  ? MOHS SURGERY  01/01/2015  ? to top of head  ? MOUTH SURGERY    ? teeth removed 4 teeth extracted 02/2010  ? RADICAL NECK DISSECTION Right 07/25/2017  ? Procedure: RIGHT NECK DISSECTION;  Surgeon: Rozetta Nunnery, MD;  Location: [place];  Service: ENT;  Laterality: Right;  ? UMBILICAL HERNIA REPAIR  01/18/2018  ? w/mesh  ? UMBILICAL HERNIA REPAIR N/A 01/18/2018  ? Procedure: UMBILICAL HERNIA REPAIR WITH MESH;  Surgeon: Jovita Kussmaul, MD;  Location: [place];  Service: General;  Laterality: N/A;  ? ?Family History  ?Problem Relation Age of Onset  ? Heart disease Mother   ? Sudden death Mother   ?     under age 41  ? Lupus Mother   ? Hypertension Father   ? Stroke Father   ? ?Social History  ? ?Socioeconomic History  ? Marital status: Married  ?  Spouse name: [name]  ? Number of children: 3  ? Years of education: Not on file  ? Highest education level: Not on file  ?Occupational History  ? Occupation: Retired Animal nutritionist  ?Tobacco Use  ? Smoking status: Never  ? Smokeless tobacco: Never  ?Vaping Use  ? Vaping Use: Never used  ?Substance and Sexual Activity  ? Alcohol use: Yes  ?  Comment: "a little wine every once in a while" 01/17/2018  ? Drug use: No  ? Sexual activity: Not on file  ?Other Topics Concern  ? Not on file  ?Social History Narrative  ? Pt lives in  split level home with his wife, Clarene Critchley.  ? Has 3 sons  ? 9th grade education  ? Retired Presenter, broadcasting  ? Right handed  ? ?Social Determinants of Health  ? ?Financial Resource Strain: Low Risk   ? Difficulty of Paying Living Expenses: Not hard  at all  ?Food Insecurity: No Food Insecurity  ? Worried About Charity fundraiser in the Last Year: Never true  ? Ran Out of Food in the Last Year: Never true  ?Transportation Needs: No Transportation Needs  ? Lack of Transportation (Medical): No  ? Lack of Transportation (Non-Medical): No  ?Physical Activity: Sufficiently Active  ? Days of Exercise per Week: 5 days  ? Minutes of Exercise per Session: 30 min  ?Stress: No Stress Concern Present  ? Feeling of Stress : Not at all  ?Social Connections: Moderately Integrated  ? Frequency of Communication with Friends and Family: More than three times a week  ? Frequency of Social Gatherings with Friends and Family: More than three times a week  ? Attends Religious Services: Never  ? Active Member of Clubs or Organizations: Yes  ? Attends Archivist Meetings: More than 4 times per year  ? Marital Status: Married  ? ? ? ?Clinical Intake: ? ?Pre-visit preparation completed: No ?Diabetic?  No ? ?Activities of Daily Living ? ?  12/18/2021  ?  8:40 AM  ?In your present state of health, do you have any difficulty performing the following activities:  ?Hearing? 0  ?Vision? 0  ?Difficulty concentrating or making decisions? 1  ?Comment DX Alzheimers  ?Walking or climbing stairs? 0  ?Dressing or bathing? 0  ?Doing errands, shopping? 0  ?Preparing Food and eating ? N  ?Using the Toilet? N  ?In the past six months, have you accidently leaked urine? N  ?Do you have problems with loss of bowel control? N  ?Managing your Medications? N  ?Managing your Finances? N  ?Housekeeping or managing your Housekeeping? Y  ?Comment Wife assist  ? ? ?Patient Care Team: ?Eulas Post, MD as PCP - General (Family Medicine) ?Fay Records, MD as  PCP - Cardiology (Cardiology) ?[name], MD as PCP - Electrophysiology (Cardiology) ?Eppie Gibson, MD as Attending Physician (Radiation Oncology) ?Leota Sauers, RN (Inactive) as Oncology Nurse

## 2021-12-18 NOTE — Patient Instructions (Addendum)
?[name] , ?Thank you for taking time to come for your Medicare Wellness Visit. I appreciate your ongoing commitment to your health goals. Please review the following plan we discussed and let me know if I can assist you in the future.  ? ?These are the goals we discussed: ? Goals   ? ?   No known goals at present (pt-stated)   ? ?  ?  ?This is a list of the screening recommended for you and due dates:  ?Health Maintenance  ?Topic Date Due  ? COVID-19 Vaccine (3 - Pfizer risk series) 01/03/2022*  ? Hemoglobin A1C  01/17/2022*  ? Urine Protein Check  01/17/2022*  ? Zoster (Shingles) Vaccine (1 of 2) 03/19/2022*  ? Eye exam for diabetics  03/13/2022  ? Flu Shot  04/06/2022  ? Complete foot exam   11/28/2022  ? Tetanus Vaccine  06/16/2031  ? Pneumonia Vaccine  Completed  ? HPV Vaccine  Aged Out  ?*Topic was postponed. The date shown is not the original due date.  ? ?Advanced directives: Yes Patient will bring copy ? ?Conditions/risks identified: None ? ?Next appointment: Follow up in one year for your annual wellness visit.  ? ?Preventive Care 62 Years and Older, Male ?Preventive care refers to lifestyle choices and visits with your health care provider that can promote health and wellness. ?What does preventive care include? ?A yearly physical exam. This is also called an annual well check. ?Dental exams once or twice a year. ?Routine eye exams. Ask your health care provider how often you should have your eyes checked. ?Personal lifestyle choices, including: ?Daily care of your teeth and gums. ?Regular physical activity. ?Eating a healthy diet. ?Avoiding tobacco and drug use. ?Limiting alcohol use. ?Practicing safe sex. ?Taking low doses of aspirin every day. ?Taking vitamin and mineral supplements as recommended by your health care provider. ?What happens during an annual well check? ?The services and screenings done by your health care provider during your annual well check will depend on your age, overall  health, lifestyle risk factors, and family history of disease. ?Counseling  ?Your health care provider may ask you questions about your: ?Alcohol use. ?Tobacco use. ?Drug use. ?Emotional well-being. ?Home and relationship well-being. ?Sexual activity. ?Eating habits. ?History of falls. ?Memory and ability to understand (cognition). ?Work and work Statistician. ?Screening  ?You may have the following tests or measurements: ?Height, weight, and BMI. ?Blood pressure. ?Lipid and cholesterol levels. These may be checked every 5 years, or more frequently if you are over 30 years old. ?Skin check. ?Lung cancer screening. You may have this screening every year starting at age 83 if you have a 30-pack-year history of smoking and currently smoke or have quit within the past 15 years. ?Fecal occult blood test (FOBT) of the stool. You may have this test every year starting at age 34. ?Flexible sigmoidoscopy or colonoscopy. You may have a sigmoidoscopy every 5 years or a colonoscopy every 10 years starting at age 43. ?Prostate cancer screening. Recommendations will vary depending on your family history and other risks. ?Hepatitis C blood test. ?Hepatitis B blood test. ?Sexually transmitted disease (STD) testing. ?Diabetes screening. This is done by checking your blood sugar (glucose) after you have not eaten for a while (fasting). You may have this done every 1-3 years. ?Abdominal aortic aneurysm (AAA) screening. You may need this if you are a current or former smoker. ?Osteoporosis. You may be screened starting at age 60 if you are at high risk. ?Talk  with your health care provider about your test results, treatment options, and if necessary, the need for more tests. ?Vaccines  ?Your health care provider may recommend certain vaccines, such as: ?Influenza vaccine. This is recommended every year. ?Tetanus, diphtheria, and acellular pertussis (Tdap, Td) vaccine. You may need a Td booster every 10 years. ?Zoster vaccine. You may  need this after age 68. ?Pneumococcal 13-valent conjugate (PCV13) vaccine. One dose is recommended after age 53. ?Pneumococcal polysaccharide (PPSV23) vaccine. One dose is recommended after age 50. ?Talk to your health care provider about which screenings and vaccines you need and how often you need them. ?This information is not intended to replace advice given to you by your health care provider. Make sure you discuss any questions you have with your health care provider. ?Document Released: 09/19/2015 Document Revised: 05/12/2016 Document Reviewed: 06/24/2015 ?Elsevier Interactive Patient Education ? 2017 [employer]. ? ?Fall Prevention in the Home ?Falls can cause injuries. They can happen to people of all ages. There are many things you can do to make your home safe and to help prevent falls. ?What can I do on the outside of my home? ?Regularly fix the edges of walkways and driveways and fix any cracks. ?Remove anything that might make you trip as you walk through a door, such as a raised step or threshold. ?Trim any bushes or trees on the path to your home. ?Use bright outdoor lighting. ?Clear any walking paths of anything that might make someone trip, such as rocks or tools. ?Regularly check to see if handrails are loose or broken. Make sure that both sides of any steps have handrails. ?Any raised decks and porches should have guardrails on the edges. ?Have any leaves, snow, or ice cleared regularly. ?Use sand or salt on walking paths during winter. ?Clean up any spills in your garage right away. This includes oil or grease spills. ?What can I do in the bathroom? ?Use night lights. ?Install grab bars by the toilet and in the tub and shower. Do not use towel bars as grab bars. ?Use non-skid mats or decals in the tub or shower. ?If you need to sit down in the shower, use a plastic, non-slip stool. ?Keep the floor dry. Clean up any water that spills on the floor as soon as it happens. ?Remove soap buildup in  the tub or shower regularly. ?Attach bath mats securely with double-sided non-slip rug tape. ?Do not have throw rugs and other things on the floor that can make you trip. ?What can I do in the bedroom? ?Use night lights. ?Make sure that you have a light by your bed that is easy to reach. ?Do not use any sheets or blankets that are too big for your bed. They should not hang down onto the floor. ?Have a firm chair that has side arms. You can use this for support while you get dressed. ?Do not have throw rugs and other things on the floor that can make you trip. ?What can I do in the kitchen? ?Clean up any spills right away. ?Avoid walking on wet floors. ?Keep items that you use a lot in easy-to-reach places. ?If you need to reach something above you, use a strong step stool that has a grab bar. ?Keep electrical cords out of the way. ?Do not use floor polish or wax that makes floors slippery. If you must use wax, use non-skid floor wax. ?Do not have throw rugs and other things on the floor that can make you  trip. ?What can I do with my stairs? ?Do not leave any items on the stairs. ?Make sure that there are handrails on both sides of the stairs and use them. Fix handrails that are broken or loose. Make sure that handrails are as long as the stairways. ?Check any carpeting to make sure that it is firmly attached to the stairs. Fix any carpet that is loose or worn. ?Avoid having throw rugs at the top or bottom of the stairs. If you do have throw rugs, attach them to the floor with carpet tape. ?Make sure that you have a light switch at the top of the stairs and the bottom of the stairs. If you do not have them, ask someone to add them for you. ?What else can I do to help prevent falls? ?Wear shoes that: ?Do not have high heels. ?Have rubber bottoms. ?Are comfortable and fit you well. ?Are closed at the toe. Do not wear sandals. ?If you use a stepladder: ?Make sure that it is fully opened. Do not climb a closed  stepladder. ?Make sure that both sides of the stepladder are locked into place. ?Ask someone to hold it for you, if possible. ?Clearly mark and make sure that you can see: ?Any grab bars or handrails. ?First and last

## 2021-12-22 ENCOUNTER — Other Ambulatory Visit: Payer: Self-pay | Admitting: Podiatry

## 2021-12-22 DIAGNOSIS — L84 Corns and callosities: Secondary | ICD-10-CM

## 2021-12-22 DIAGNOSIS — M2141 Flat foot [pes planus] (acquired), right foot: Secondary | ICD-10-CM

## 2021-12-22 DIAGNOSIS — M21619 Bunion of unspecified foot: Secondary | ICD-10-CM

## 2021-12-22 DIAGNOSIS — E1149 Type 2 diabetes mellitus with other diabetic neurological complication: Secondary | ICD-10-CM

## 2021-12-24 ENCOUNTER — Other Ambulatory Visit: Payer: Self-pay | Admitting: Internal Medicine

## 2021-12-27 ENCOUNTER — Other Ambulatory Visit: Payer: Self-pay | Admitting: Internal Medicine

## 2021-12-29 NOTE — Progress Notes (Deleted)
Cardiology Office Note    Date:  12/29/2021   ID:  Collin [name], DOB Sep 07, 1940, MRN [id number]   PCP:  [name], MD   [facility]  Cardiologist:  Dietrich Pates, MD   Advanced Practice Provider:  No care team member to display Electrophysiologist:  Lewayne Bunting, MD   [phone]   No chief complaint on file.   History of Present Illness:  Collin [name] is a 81 y.o. male with a history of  NICM  S/p ICD, NSVT, tongue cancer s/p surgery and radiation. Last echo in 2018 LVEF improved to 55 to 60%.   Pt also has a history of atiral fib ( on coumadin), HL, DM, OSA   Pt also with a hx of unsteadiness and falls.  Patient last saw Dr. Tenny Craw 10/10/20 and doing well. Last pacer check 12/15/21 stable-13 NSVT.      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 It sometimes shows as a shadow on scans.     Past Surgical History:  Procedure Laterality Date   CARDIAC DEFIBRILLATOR PLACEMENT  2006   CATARACT EXTRACTION Bilateral    01/07/2016. 01/22/2016   CYST EXCISION     multiple drainage to a cyst in his neck.    DIRECT LARYNGOSCOPY N/A 11/16/2016   Procedure: DIRECT LARYNGOSCOPY AND BIOPSY;  Surgeon: [name], MD;  Location: [facility] OR;  Service: ENT;  Laterality: N/A;   EYE SURGERY     HERNIA REPAIR   1950s   IMPLANTABLE CARDIOVERTER DEFIBRILLATOR (ICD) GENERATOR CHANGE N/A 05/10/2014   Procedure: ICD GENERATOR CHANGE;  Surgeon: [name], MD;  Location: [facility] CATH LAB;  Service: Cardiovascular;  Laterality: N/A;   INSERTION OF MESH N/A 01/18/2018   Procedure: INSERTION OF MESH;  Surgeon: [name], MD;  Location: [facility] OR;  Service: General;  Laterality: N/A;   IR GASTROSTOMY TUBE MOD SED  12/27/2016   IR GASTROSTOMY TUBE REMOVAL  03/24/2018   IR PATIENT EVAL TECH 0-60 MINS  01/17/2017   MASS EXCISION Right 11/16/2016   Procedure: EXCISION RIGHT NECK LIPOMA;  Surgeon: [name], MD;  Location: [facility] OR;  Service: ENT;  Laterality: Right;   MOHS SURGERY  01/01/2015   to top of head   MOUTH SURGERY     teeth removed 4 teeth extracted 02/2010   RADICAL NECK DISSECTION Right 07/25/2017   Procedure: RIGHT NECK DISSECTION;  Surgeon: [name], MD;  Location: [facility] OR;  Service: ENT;  Laterality: Right;   UMBILICAL HERNIA REPAIR  01/18/2018   w/mesh   UMBILICAL HERNIA REPAIR N/A 01/18/2018   Procedure: UMBILICAL HERNIA REPAIR WITH MESH;  Surgeon: Carolynne Edouard,  Lynetta Mare, MD;  Location: [facility] OR;  Service: General;  Laterality: N/A;    Current Medications: No outpatient medications have been marked as taking for the 12/30/21 encounter (Appointment) with Dyann Kief, PA-C.     Allergies:   Niacin and Niaspan [niacin er]   Social History   Socioeconomic History   Marital status: Married    Spouse name: [name]   Number of children: 3   Years of education: Not on file   Highest education level: Not on file  Occupational History   Occupation: Retired Engineer, materials  Tobacco Use   Smoking status: Never   Smokeless tobacco: Never  Vaping Use   Vaping Use: Never used  Substance and Sexual Activity   Alcohol use: Yes    Comment: "a little wine every once in a while" 01/17/2018   Drug use: No   Sexual activity: Not on file  Other Topics Concern   Not on file  Social History  Narrative   Pt lives in split level home with his wife, Aggie Cosier.   Has 3 sons   9th grade education   Retired Electrical engineer   Right handed   Social Determinants of Health   Financial Resource Strain: Low Risk    Difficulty of Paying Living Expenses: Not hard at all  Food Insecurity: No Food Insecurity   Worried About Programme researcher, broadcasting/film/video in the Last Year: Never true   Barista in the Last Year: Never true  Transportation Needs: No Transportation Needs   Lack of Transportation (Medical): No   Lack of Transportation (Non-Medical): No  Physical Activity: Sufficiently Active   Days of Exercise per Week: 5 days   Minutes of Exercise per Session: 30 min  Stress: No Stress Concern Present   Feeling of Stress : Not at all  Social Connections: Moderately Integrated   Frequency of Communication with Friends and Family: More than three times a week   Frequency of Social Gatherings with Friends and Family: More than three times a week   Attends Religious Services: Never   Database administrator or Organizations: Yes   Attends Engineer, structural: More than 4 times per year   Marital Status: Married     Family History:  The patient's ***family history includes Heart disease in his mother; Hypertension in his father; Lupus in his mother; Stroke in his father; Sudden death in his mother.   ROS:   Please see the history of present illness.    ROS All other systems reviewed and are negative.   PHYSICAL EXAM:   VS:  There were no vitals taken for this visit.  Physical Exam  GEN: Well nourished, well developed, in no acute distress  HEENT: normal  Neck: no JVD, carotid bruits, or masses Cardiac:RRR; no murmurs, rubs, or gallops  Respiratory:  clear to auscultation bilaterally, normal work of breathing GI: soft, nontender, nondistended, + BS Ext: without cyanosis, clubbing, or edema, Good distal pulses bilaterally MS: no deformity or atrophy  Skin: warm and dry, no  rash Neuro:  Alert and Oriented x 3, Strength and sensation are intact Psych: euthymic mood, full affect  Wt Readings from Last 3 Encounters:  12/18/21 217 lb (98.4 kg)  09/04/21 218 lb (98.9 kg)  08/19/21 211 lb 1.6 oz (95.8 kg)      Studies/Labs Reviewed:   EKG:  EKG is*** ordered today.  The ekg ordered today demonstrates ***  Recent Labs: 02/20/2021: Magnesium 2.2 06/15/2021:  ALT 22; BUN 24; Creatinine, Ser 1.10; Hemoglobin 14.3; Platelets 198; Potassium 4.3; Sodium 138   Lipid Panel    Component Value Date/Time   CHOL 161 10/10/2020 1622   TRIG 175 (H) 10/10/2020 1622   HDL 74 10/10/2020 1622   CHOLHDL 2.2 10/10/2020 1622   CHOLHDL 5 11/13/2015 1525   VLDL 73.0 (H) 10/04/2014 0931   LDLCALC 59 10/10/2020 1622   LDLDIRECT 72.0 11/13/2015 1525    Additional studies/ records that were reviewed today include:  Echo 2018 Study Conclusions   - Left ventricle: Systolic function was normal. The estimated    ejection fraction was in the range of 55% to 60%. Wall motion was    normal; there were no regional wall motion abnormalities.  - Left atrium: Severely dilated.  - Right ventricle: The cavity size was normal. Wall thickness was    normal. AICD wire noted in right ventricle. Systolic function was    normal.  - Right atrium: The atrium was mildly dilated. AICD wire noted in    right atrium.  - Atrial septum: No defect or patent foramen ovale was identified.  - Inferior vena cava: The vessel was normal in size. The    respirophasic diameter changes were in the normal range (>= 50%),    consistent with normal central venous pressure.   Impressions:   - Compared to a prior study in 2015, there are no significant    changes.    Risk Assessment/Calculations:   {Does this patient have ATRIAL FIBRILLATION?:[id number]}     ASSESSMENT:    1. NICM (nonischemic cardiomyopathy) (HCC)   2. Ventricular tachycardia (HCC)   3. Paroxysmal atrial fibrillation (HCC)   4.  Mixed hyperlipidemia   5. Type 2 diabetes mellitus without complication, unspecified whether long term insulin use (HCC)   6. OSA (obstructive sleep apnea)      PLAN:  In order of problems listed above:  NICM S/P ICD  NSVT  PAF on coumadin  HLD  DM  OSA  Shared Decision Making/Informed Consent   {Are you ordering a CV Procedure (e.g. stress test, cath, DCCV, TEE, etc)?   Press F2        :[id number]}    Medication Adjustments/Labs and Tests Ordered: Current medicines are reviewed at length with the patient today.  Concerns regarding medicines are outlined above.  Medication changes, Labs and Tests ordered today are listed in the Patient Instructions below. There are no Patient Instructions on file for this visit.   Elson Clan, PA-C  12/29/2021 12:12 PM    [facility] [address] [place], [place], [place]  [address] Phone: [phone]; Fax: [phone]

## 2021-12-30 ENCOUNTER — Ambulatory Visit: Payer: Medicare HMO | Admitting: Physician Assistant

## 2021-12-30 DIAGNOSIS — E782 Mixed hyperlipidemia: Secondary | ICD-10-CM

## 2021-12-30 DIAGNOSIS — I472 Ventricular tachycardia, unspecified: Secondary | ICD-10-CM

## 2021-12-30 DIAGNOSIS — I428 Other cardiomyopathies: Secondary | ICD-10-CM

## 2021-12-30 DIAGNOSIS — E119 Type 2 diabetes mellitus without complications: Secondary | ICD-10-CM

## 2021-12-30 DIAGNOSIS — G4733 Obstructive sleep apnea (adult) (pediatric): Secondary | ICD-10-CM

## 2021-12-30 DIAGNOSIS — I480 Paroxysmal atrial fibrillation: Secondary | ICD-10-CM

## 2021-12-31 NOTE — Progress Notes (Signed)
Remote ICD transmission.   

## 2022-01-01 ENCOUNTER — Telehealth: Payer: Self-pay | Admitting: Internal Medicine

## 2022-01-01 MED ORDER — DILTIAZEM HCL 90 MG PO TABS: ORAL_TABLET | ORAL | 0 refills | Status: DC

## 2022-01-01 NOTE — Telephone Encounter (Signed)
?*  STAT* If patient is at the pharmacy, call can be transferred to refill team. ? ? ?1. Which medications need to be refilled? (please list name of each medication and dose if known) Diltiazem ? ?2. Which pharmacy/location (including street and city if local pharmacy) is medication to be sent to?[facility], [place] ? ?3. Do they need a 30 day or 90 day supply? Enough until his appointment on Tuesday(01-05-22)- he is completely out of it ? ?

## 2022-01-01 NOTE — Telephone Encounter (Signed)
Pt's medication was sent to pt's pharmacy as requested. Confirmation received.  °

## 2022-01-04 NOTE — Progress Notes (Signed)
?Cardiology Office Note:   ? ?Date:  01/05/2022  ? ?ID:  Collin [name], DOB 12-Dec-1940, MRN [id number] ? ?PCP:  Collin Covey, MD  ?[facility] Providers ?Cardiologist:  Collin Pates, MD ?Electrophysiologist:  Collin Bunting, MD    ?Referring MD: Collin Covey, MD  ? ?Chief Complaint:  Follow-up for CHF, atrial fibrillation ?  ? ?Patient Profile: ?HFimpEF (heart failure with improved ejection fraction)  ?Non-ischemic cardiomyopathy ?Cath in 2002: normal coronary arteries  ?EF was 28 >> improved to normal ?Echocardiogram 04/2017: EF 55-60 ?S/p ICD ?Persistent atrial fibrillation  ?Diabetes mellitus  ?Hyperlipidemia  ?OSA ?Throat CA s/p radiation ?Atherosclerosis ?Alzheimer's disease ? ?Prior CV Studies: ?ECHO COMPLETE WO IMAGING ENHANCING AGENT 04/28/2017 ?EF 55-60, no RWMA, severe LAE, normal RVSF, mild RAE ?   ?Echocardiogram 04/28/17 ?EF 55-60, normal wall motion, severe LAE, normal RVSF, mild RAE ?  ?LE venous US 03/24/17 ?No evidence of lower extremity deep or superficial venous thrombus or incompetence, bilaterally. ?  ?Echo 04/22/14 ?Mod conc LVH, EF 55-60, no RWMA, mildly dilated ascending aorta (root 40 mm, ascending aorta 37 mm), severe LAE, severe RAE, lipoma hypertrophy of the Atrial septum, mild TR ?  ?Echo 04/17/13 ?Moderate LVH, EF 35-40, diffuse HK, moderate to severe LAE, mild RAE ?  ?Echo 5/12 ?Moderate LVH, EF 50-55, moderate LAE, mild RAE ? ?History of Present Illness:   ?Collin [name] is a 81 y.o. male with the above problem list.  He was last seen by Dr. Tenny [name] in February 2022.    He returns for follow-up.  He is here with his wife.  Overall, he has been doing well.  He does get lightheaded/dizzy sometimes.  He often loses his balance.  He walks with a walker most of the time.  He has not had syncope.  He has not had chest discomfort or shortness of breath.  He has not had orthopnea.  He has some mild ankle edema, especially on the left. ?   ?Past Medical History:  ?Diagnosis Date  ? AICD  (automatic cardioverter/defibrillator) present   ? Atrial fibrillation (HCC)   ? Cancer [facility])   ? tongue cancer  ? Cardiomyopathy, nonischemic (HCC) 1998  ? CHF (congestive heart failure) (HCC)   ? Claustrophobia   ? Diabetes mellitus   ? Dysrhythmia   ? Heart disease   ? History of echocardiogram   ? Echo 8/18:  EF 55-60, no RWMA, severe LAE, normal RVSF, mild RAE  ? History of radiation therapy 12/07/16- 01/24/17  ? Base of Tongue 70 Gy in 35 fractions  ? Hyperlipidemia   ? Memory loss   ? OSA (obstructive sleep apnea)   ? Shingles   ? Umbilical hernia   ? Vein disorder   ? 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 It sometimes shows as a shadow on scans.   ? ?Current Medications: ?Current Meds  ?Medication Sig  ? acetaminophen (TYLENOL) 500 MG tablet Take 1,000 mg by mouth daily as needed for moderate pain.   ? ammonium lactate (AMLACTIN) 12 % lotion Apply 1 application. topically as needed for dry skin.  ? carvedilol (COREG) 25 MG tablet TAKE ONE TABLET BY MOUTH TWICE A DAY. *NEED APPOINTMENT*  ? Cholecalciferol (VITAMIN D3) 50 MCG (2000 UT) TABS Take 1 tablet by mouth daily.  ? donepezil (ARICEPT) 10 MG tablet Take 1 tablet (10 mg total) by mouth daily.  ? memantine (NAMENDA) 10 MG tablet Take 1  tablet (10 mg total) by mouth 2 (two) times daily.  ? PARoxetine (PAXIL) 20 MG tablet TAKE ONE TABLET BY MOUTH DAILY  ? polyethylene glycol (MIRALAX) packet Take 17 g by mouth daily as needed. Available OTC  ? potassium chloride (KLOR-CON M) 10 MEQ tablet DISSOLVE 1 TABLET IN WATER AND ADMINISTER VIA PEG TUBE. TAKE ONE TABLET BY MOUTH DAILY.  ? rosuvastatin (CRESTOR) 10 MG tablet Take 1 tablet (10 mg total) by mouth daily.  ? warfarin (COUMADIN) 5 MG tablet TAKE 1/2 TABLET (2.5 MG) DAILY BY MOUTH EXCEPT TAKE 1 TABLET (5 MG) ON MONDAYS, WEDNESDAYS AND FRIDAYS OR AS DIRECTED BY ANTICOAGULATION CLINIC  ? [DISCONTINUED] diltiazem (CARDIZEM) 90 MG tablet TAKE ONE TABLET BY MOUTH DAILY  ?  [DISCONTINUED] torsemide (DEMADEX) 20 MG tablet TALE 1.5 TABLETS BY MOUTH DAILY ALTERNATING WITH ONE TABLET EVERY THIRD DAY  ?  ?Allergies:   Niacin and Niaspan [niacin er]  ? ?Social History  ? ?Tobacco Use  ? Smoking status: Never  ? Smokeless tobacco: Never  ?Vaping Use  ? Vaping Use: Never used  ?Substance Use Topics  ? Alcohol use: Yes  ?  Comment: "a little wine every once in a while" 01/17/2018  ? Drug use: No  ?  ?Family Hx: ?The patient's family history includes Heart disease in his mother; Hypertension in his father; Lupus in his mother; Stroke in his father; Sudden death in his mother. ? ?Review of Systems  ?Gastrointestinal:  Negative for hematochezia and melena.  ?Genitourinary:  Negative for hematuria.   ? ?EKGs/Labs/Other Test Reviewed:   ? ?EKG:  EKG is  ordered today.  The ekg ordered today demonstrates V paced, HR 61 ? ?Recent Labs: ?02/20/2021: Magnesium 2.2 ?06/15/2021: ALT 22; BUN 24; Creatinine, Ser 1.10; Hemoglobin 14.3; Platelets 198; Potassium 4.3; Sodium 138  ? ?Recent Lipid Panel ?No results for input(s): CHOL, TRIG, HDL, VLDL, LDLCALC, LDLDIRECT in the last 8760 hours.  ? ?Risk Assessment/Calculations:   ? ?CHA2DS2-VASc Score = 5  ? This indicates a 7.2% annual risk of stroke. ?The patient's score is based upon: ?CHF History: 1 ?HTN History: 0 ?Diabetes History: 1 ?Stroke History: 0 ?Vascular Disease History: 1 ?Age Score: 2 ?Gender Score: 0 ?  ? ?    ?Physical Exam:   ? ?VS:  BP 132/80   Pulse 61   Ht 6\' 1"  (1.854 m)   Wt 218 lb 3.2 oz (99 kg)   SpO2 96%   BMI 28.79 kg/m?    ? ?Wt Readings from Last 3 Encounters:  ?01/05/22 218 lb 3.2 oz (99 kg)  ?12/18/21 217 lb (98.4 kg)  ?09/04/21 218 lb (98.9 kg)  ?  ?Constitutional:   ?   Appearance: Healthy appearance. Not in distress.  ?Neck:  ?   Vascular: No JVR. JVD normal.  ?Pulmonary:  ?   Effort: Pulmonary effort is normal.  ?   Breath sounds: No wheezing. No rales.  ?Cardiovascular:  ?   Normal rate. Regular rhythm. Normal S1. Normal S2.    ?   Murmurs: There is no murmur.  ?Edema: ?   Peripheral edema present. ?   Ankle: trace edema of the left ankle. ?Abdominal:  ?   Palpations: Abdomen is soft.  ?Skin: ?   General: Skin is warm and dry.  ?Neurological:  ?   General: No focal deficit present.  ?   Mental Status: Alert and oriented to person, place and time.  ?   Cranial Nerves: Cranial nerves are intact.  ?  ?    ?  ASSESSMENT & PLAN:   ?HFimpEF (heart failure with improved EF) ?EF has improved to normal.  Echocardiogram in 2018 with EF 55-60.  Volume status is currently stable continue torsemide 30 mg daily alternating with 20 mg every third day, carvedilol 25 mg twice daily.  If volume status becomes an issue in the future, we could consider adding spironolactone +/- SGLT2 inhibitor.  Follow-up in 6 months. ? ?Persistent atrial fibrillation (HCC) ?He is tolerating anticoagulation.  Coumadin is managed by primary care.  He is V pacing on electrocardiogram today.  He typically does not the pace when I review several prior EKGs.  He does note some lightheadedness from time to time.  He is pacing at 61 today.  I am not convinced that this is causing his symptoms of lightheadedness.  However, question if continuous V pacing may be making him feel bad.  I will ask the device clinic to take a look at his defibrillator to see if he is V pacing majority of the time.  We could consider decreasing his diltiazem if needed.  For now, continue carvedilol 25 mg twice daily, diltiazem 90 mg daily, warfarin.  Follow-up in 6 months. ? ?Mixed hyperlipidemia ?With a history of diabetes, LDL should be <70.  Continue rosuvastatin 10 mg daily.  Obtain direct LDL, CMET today. ? ?AICD (automatic cardioverter/defibrillator) present ?As noted, I will ask the device clinic to interrogate his defibrillator to see if he is V pacing a majority of the time.  Follow-up with EP as planned. ? 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  Question if Aricept is lowering his heart rate allowing him to be V paced more often. ?  ?     ?   ?Dispo:  Return in about 6 months (around 07/08/2022) for Routine Follow Up, w/ Dr. Tenny [name], or

## 2022-01-05 ENCOUNTER — Encounter: Payer: Self-pay | Admitting: Physician Assistant

## 2022-01-05 ENCOUNTER — Ambulatory Visit: Payer: Medicare HMO | Admitting: Physician Assistant

## 2022-01-05 ENCOUNTER — Telehealth: Payer: Self-pay

## 2022-01-05 VITALS — BP 132/80 | HR 61 | Ht 73.0 in | Wt 218.2 lb

## 2022-01-05 DIAGNOSIS — Z95810 Presence of automatic (implantable) cardiac defibrillator: Secondary | ICD-10-CM

## 2022-01-05 DIAGNOSIS — E782 Mixed hyperlipidemia: Secondary | ICD-10-CM

## 2022-01-05 DIAGNOSIS — I4819 Other persistent atrial fibrillation: Secondary | ICD-10-CM | POA: Diagnosis not present

## 2022-01-05 DIAGNOSIS — I5032 Chronic diastolic (congestive) heart failure: Secondary | ICD-10-CM | POA: Diagnosis not present

## 2022-01-05 DIAGNOSIS — F02818 Dementia in other diseases classified elsewhere, unspecified severity, with other behavioral disturbance: Secondary | ICD-10-CM

## 2022-01-05 DIAGNOSIS — G301 Alzheimer's disease with late onset: Secondary | ICD-10-CM

## 2022-01-05 DIAGNOSIS — R69 Illness, unspecified: Secondary | ICD-10-CM | POA: Diagnosis not present

## 2022-01-05 LAB — COMPREHENSIVE METABOLIC PANEL
ALT: 20 IU/L (ref 0–44)
AST: 20 IU/L (ref 0–40)
Albumin/Globulin Ratio: 1.6 (ref 1.2–2.2)
Albumin: 4.3 g/dL (ref 3.7–4.7)
Alkaline Phosphatase: 119 IU/L (ref 44–121)
BUN/Creatinine Ratio: 18 (ref 10–24)
BUN: 19 mg/dL (ref 8–27)
Bilirubin Total: 0.4 mg/dL (ref 0.0–1.2)
CO2: 28 mmol/L (ref 20–29)
Calcium: 9.1 mg/dL (ref 8.6–10.2)
Chloride: 101 mmol/L (ref 96–106)
Creatinine, Ser: 1.07 mg/dL (ref 0.76–1.27)
Globulin, Total: 2.7 g/dL (ref 1.5–4.5)
Glucose: 124 mg/dL — ABNORMAL HIGH (ref 70–99)
Potassium: 4.3 mmol/L (ref 3.5–5.2)
Sodium: 140 mmol/L (ref 134–144)
Total Protein: 7 g/dL (ref 6.0–8.5)
eGFR: 70 mL/min/{1.73_m2} (ref >59)

## 2022-01-05 LAB — LDL CHOLESTEROL, DIRECT: LDL Direct: 56 mg/dL (ref 0–99)

## 2022-01-05 MED ORDER — TORSEMIDE 20 MG PO TABS: ORAL_TABLET | ORAL | 3 refills | Status: DC

## 2022-01-05 MED ORDER — DILTIAZEM HCL 90 MG PO TABS: ORAL_TABLET | ORAL | 3 refills | Status: DC

## 2022-01-05 NOTE — Telephone Encounter (Signed)
-----   Message from Liliane Shi, [place] sent at 01/05/2022  9:08 AM EDT ----- ?Regarding: v pacing ?Hi Maricela Schreur ?Can you guys do a remote check on him to see how much he is pacing? He was v pacing today on his EKG.  He does not usually v pace.  He has Alzheimer's and is on namenda and aricept.  He may be going slower with these in addition to coreg and diltiazem.  He has been lightheaded at times.  He is pacing at 63.  So I'm not convinced it is related.  But wonder if he feels bad when he is pacing?? Anyway, I wanted to see how much he is v pacing and if GT has any concerns about it. ?Thanks! ?Scott  ? ?

## 2022-01-05 NOTE — Assessment & Plan Note (Signed)
EF has improved to normal.  Echocardiogram in 2018 with EF 55-60.  Volume status is currently stable continue torsemide 30 mg daily alternating with 20 mg every third day, carvedilol 25 mg twice daily.  If volume status becomes an issue in the future, we could consider adding spironolactone +/- SGLT2 inhibitor.  Follow-up in 6 months. ?

## 2022-01-05 NOTE — Assessment & Plan Note (Signed)
He is tolerating anticoagulation.  Coumadin is managed by primary care.  He is V pacing on electrocardiogram today.  He typically does not the pace when I review several prior EKGs.  He does note some lightheadedness from time to time.  He is pacing at 61 today.  I am not convinced that this is causing his symptoms of lightheadedness.  However, question if continuous V pacing may be making him feel bad.  I will ask the device clinic to take a look at his defibrillator to see if he is V pacing majority of the time.  We could consider decreasing his diltiazem if needed.  For now, continue carvedilol 25 mg twice daily, diltiazem 90 mg daily, warfarin.  Follow-up in 6 months. ?

## 2022-01-05 NOTE — Assessment & Plan Note (Signed)
With a history of diabetes, LDL should be <70.  Continue rosuvastatin 10 mg daily.  Obtain direct LDL, CMET today. ?

## 2022-01-05 NOTE — Telephone Encounter (Signed)
Attempted to contact patient to send remote transmission. No answer, LMTCB. ?

## 2022-01-05 NOTE — Patient Instructions (Addendum)
Medication Instructions:  ?Your physician recommends that you continue on your current medications as directed. Please refer to the Current Medication list given to you today. ? ?*If you need a refill on your cardiac medications before your next appointment, please call your pharmacy* ? ? ?Lab Work: ?TODAY: CMET & DIRECT LDL  ? ?If you have labs (blood work) drawn today and your tests are completely normal, you will receive your results only by: ?MyChart Message (if you have MyChart) OR ?A paper copy in the mail ?If you have any lab test that is abnormal or we need to change your treatment, we will call you to review the results. ? ? ?Testing/Procedures: ?None ordered ? ? ?Follow-Up: ?At [facility], you and your health needs are our priority.  As part of our continuing mission to provide you with exceptional heart care, we have created designated Provider Care Teams.  These Care Teams include your primary Cardiologist (physician) and Advanced Practice Providers (APPs -  Physician Assistants and Nurse Practitioners) who all work together to provide you with the care you need, when you need it. ? ?We recommend signing up for the patient portal called "MyChart".  Sign up information is provided on this After Visit Summary.  MyChart is used to connect with patients for Virtual Visits (Telemedicine).  Patients are able to view lab/test results, encounter notes, upcoming appointments, etc.  Non-urgent messages can be sent to your provider as well.   ?To learn more about what you can do with MyChart, go to [email or web address].   ? ?Your next appointment:   ?6 month(s)  07/13/22 ARRIVE AT 8:25 ? ?The format for your next appointment:   ?In Person ? ?Provider:   ?Dorris Carnes, MD  or [name], PA-C       ? ? ?Other Instructions ? ? ?Important Information About Sugar ? ? ? ? ?  ?

## 2022-01-05 NOTE — Assessment & Plan Note (Signed)
He is currently on Aricept and Namenda.  Question if Aricept is lowering his heart rate allowing him to be V paced more often. ?

## 2022-01-05 NOTE — Assessment & Plan Note (Signed)
As noted, I will ask the device clinic to interrogate his defibrillator to see if he is V pacing a majority of the time.  Follow-up with EP as planned. ?

## 2022-01-07 NOTE — Telephone Encounter (Addendum)
LVM on home phone and cell phone for patient to send manual transmission direct number to device clinic given if have questions  ?

## 2022-01-08 ENCOUNTER — Encounter: Payer: Self-pay | Admitting: Physician Assistant

## 2022-01-08 NOTE — Telephone Encounter (Signed)
This encounter was created in error - please disregard.

## 2022-01-11 ENCOUNTER — Ambulatory Visit (INDEPENDENT_AMBULATORY_CARE_PROVIDER_SITE_OTHER): Payer: Medicare HMO

## 2022-01-11 DIAGNOSIS — Z7901 Long term (current) use of anticoagulants: Secondary | ICD-10-CM

## 2022-01-11 LAB — POCT INR: INR: 2.2 (ref 2.0–3.0)

## 2022-01-11 NOTE — Progress Notes (Signed)
Continue 1/2 tablet daily except take 1 tablet on Mon, Wed, and Fri.   Recheck in 5 wks.   ?

## 2022-01-11 NOTE — Patient Instructions (Addendum)
Pre visit review using our clinic review tool, if applicable. No additional management support is needed unless otherwise documented below in the visit note. ? ?Continue 1/2 tablet daily except take 1 tablet on Mon, Wed, and Fri.   Recheck in 5 wks.   ?

## 2022-01-13 NOTE — Telephone Encounter (Signed)
Transmission received. Routing to [employer], PA-C per his request to assess RV pacing percent.  ? ? ? ?

## 2022-01-16 ENCOUNTER — Other Ambulatory Visit: Payer: Self-pay | Admitting: Internal Medicine

## 2022-01-18 NOTE — Telephone Encounter (Signed)
Collin [name], DPR on file, has been made aware that pt's device interrogation has been viewed, pt was only pacing 23 % and we will not make any changes. ? ?She was appreciative of the call back.  ?

## 2022-01-18 NOTE — Telephone Encounter (Signed)
Please call patient and let him know I reviewed device interrogation with device clinic.  He is only pacing 23% of the time.   There is no need to make any changes at this time. ?[name], PA-C    ?01/18/2022 7:23 AM   ?

## 2022-01-27 ENCOUNTER — Other Ambulatory Visit: Payer: Self-pay | Admitting: Internal Medicine

## 2022-01-28 ENCOUNTER — Ambulatory Visit (INDEPENDENT_AMBULATORY_CARE_PROVIDER_SITE_OTHER): Payer: Medicare HMO

## 2022-01-28 DIAGNOSIS — I5032 Chronic diastolic (congestive) heart failure: Secondary | ICD-10-CM

## 2022-01-28 DIAGNOSIS — R413 Other amnesia: Secondary | ICD-10-CM

## 2022-01-28 DIAGNOSIS — I480 Paroxysmal atrial fibrillation: Secondary | ICD-10-CM

## 2022-01-28 DIAGNOSIS — I519 Heart disease, unspecified: Secondary | ICD-10-CM

## 2022-01-28 DIAGNOSIS — E782 Mixed hyperlipidemia: Secondary | ICD-10-CM

## 2022-01-28 NOTE — Chronic Care Management (AMB) (Signed)
Chronic Care Management   CCM RN Visit Note  01/28/2022 Name: Collin [name] MRN: [id number] DOB: 1941-07-08  Subjective: Collin [name] is a 81 y.o. year old male who is a primary care patient of Burchette, Elberta Fortis, MD. The care management team was consulted for assistance with disease management and care coordination needs.    Engaged with patient by telephone for follow up visit in response to provider referral for case management and/or care coordination services.   Consent to Services:  The patient was given information about Chronic Care Management services, agreed to services, and gave verbal consent prior to initiation of services.  Please see initial visit note for detailed documentation.   Patient agreed to services and verbal consent obtained.   Assessment: Review of patient past medical history, allergies, medications, health status, including review of consultants reports, laboratory and other test data, was performed as part of comprehensive evaluation and provision of chronic care management services.   SDOH (Social Determinants of Health) assessments and interventions performed:    CCM Care Plan  Allergies  Allergen Reactions   Niacin Other (See Comments)   Niaspan [Niacin Er] Other (See Comments)    FLUSHED    Outpatient Encounter Medications as of 01/28/2022  Medication Sig   acetaminophen (TYLENOL) 500 MG tablet Take 1,000 mg by mouth daily as needed for moderate pain.    ammonium lactate (AMLACTIN) 12 % lotion Apply 1 application. topically as needed for dry skin.   carvedilol (COREG) 25 MG tablet TAKE ONE TABLET BY MOUTH TWICE A DAY. *NEED APPOINTMENT*   Cholecalciferol (VITAMIN D3) 50 MCG (2000 UT) TABS Take 1 tablet by mouth daily.   diltiazem (CARDIZEM) 90 MG tablet TAKE ONE TABLET BY MOUTH DAILY   donepezil (ARICEPT) 10 MG tablet Take 1 tablet (10 mg total) by mouth daily.   memantine (NAMENDA) 10 MG tablet Take 1 tablet (10 mg total) by mouth 2 (two)  times daily.   PARoxetine (PAXIL) 20 MG tablet TAKE ONE TABLET BY MOUTH DAILY   polyethylene glycol (MIRALAX) packet Take 17 g by mouth daily as needed. Available OTC   potassium chloride (KLOR-CON M) 10 MEQ tablet DISSOLVE 1 TABLET IN WATER AND ADMINISTER VIA PEG TUBE. TAKE ONE TABLET BY MOUTH DAILY.   rosuvastatin (CRESTOR) 10 MG tablet TAKE ONE TABLET BY MOUTH DAILY   torsemide (DEMADEX) 20 MG tablet TAKE 1.5 TABLETS BY MOUTH DAILY ALTERNATING WITH ONE TABLET EVERY THIRD DAY   warfarin (COUMADIN) 5 MG tablet TAKE 1/2 TABLET (2.5 MG) DAILY BY MOUTH EXCEPT TAKE 1 TABLET (5 MG) ON MONDAYS, WEDNESDAYS AND FRIDAYS OR AS DIRECTED BY ANTICOAGULATION CLINIC   No facility-administered encounter medications on file as of 01/28/2022.    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) 09/04/2021   Memory loss 04/17/2021   Facial swelling 02/19/2021   Odontogenic infection of jaw 02/19/2021   Dental abscess 02/19/2021   Osteoarthritis of both hands 09/19/2019   Malnutrition of moderate degree 11/18/2017   Supratherapeutic INR 11/14/2017   AKI (acute kidney injury) (HCC) 11/12/2017   Vein disorder    Umbilical hernia    Shingles    History of radiation therapy    History of echocardiogram    Heart disease    Claustrophobia    CHF (congestive heart failure) (HCC)    Cancer (HCC)    AICD (automatic cardioverter/defibrillator) present    History of head and neck  cancer 07/25/2017   Long term (current) use of anticoagulants 06/22/2017   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   Thrush, oral 12/21/2016   Weight loss, abnormal 12/21/2016   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   Umbilical hernia without obstruction and without gangrene    Pressure injury of skin 11/14/2016   Tongue mass: Per Ct neck 11/10/2016 11/12/2016   SBO (small bowel obstruction) (HCC) 11/11/2016   Encounter for  therapeutic drug monitoring 10/25/2013   Diabetic peripheral neuropathy (HCC) 10/14/2011   Hypotension 08/20/2011   Acute renal failure (HCC) 08/20/2011   OSA (obstructive sleep apnea) 04/15/2011   ICD-[employer] 03/30/2011   HFimpEF (heart failure with improved EF) 03/30/2011   Persistent atrial fibrillation (HCC) 12/14/2010   Nonischemic cardiomyopathy (HCC) 12/14/2010   Type 2 diabetes mellitus without complication, without long-term current use of insulin (HCC) 12/14/2010   Mixed hyperlipidemia 12/14/2010   Cardiomyopathy, nonischemic (HCC) 09/06/1996    Conditions to be addressed/monitored:Atrial Fibrillation, CHF, CAD, HTN, HLD, and Dementia  Care Plan : RN Care Manager Plan of Care  Updates made by Yetta Glassman, RN since 01/28/2022 12:00 AM     Problem: Chronic Disease Management and Care Coordination Needs (A fib, CAD, Vtach, CHF, HLD, dementia, falls)   Priority: High     Long-Range Goal: Establish Plan of Care for Chronic Disease Management Needs (A fib, CAD, Vtach, CHF, HLD, dementia, falls)   Start Date: 07/28/2021  Expected End Date: 05/23/2022  Priority: High  Note:   Current Barriers:  Care Coordination needs related to Memory Deficits Chronic Disease Management support and education needs related to Atrial Fibrillation, CHF, CAD, HLD, Dementia, and falls Wife states that pt has been doing good.  States he is using his walker at the day center and has not had any falls.  States he is doing good at his memory day care and he likes going there.   Denies any chest pains, swelling or shortness of breath.  States he saw the podiatrist and he is going to get diabetic shoes. Reports pt is eating well and she tries to give him a healthy diet. Denies any bleeding or bruising   States she is coping with caring for pt at this time and she is involved with a caregiver support group that helps.  Continues to decline LCSW referral.  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  [place] her son is going to move. States that their house is going on the market today.  RNCM Clinical Goal(s):  Patient will verbalize understanding of plan for management of Atrial Fibrillation, CHF, CAD, HLD, Dementia, and falls as evidenced by voiced adherence to plan of care verbalize basic understanding of  Atrial Fibrillation, CHF, CAD, HLD, Dementia, and falls disease process and self health management plan as evidenced by voiced understanding and teach back take all medications exactly as prescribed and will call provider for medication related questions as evidenced by dispense report and pt verbalization attend all scheduled medical appointments: Coumadin clinic 02/15/22, Neurology 03/03/22 as evidenced by medical records demonstrate Ongoing adherence to prescribed treatment plan for Atrial Fibrillation, CHF, CAD, HLD, Dementia, and falls  as evidenced by readings within limits and voices adherence of plan of care  through collaboration with RN Care manager, provider, and care team.   Interventions: 1:1 collaboration with primary care provider regarding development and update of  comprehensive plan of care as evidenced by provider attestation and co-signature Inter-disciplinary care team collaboration (see longitudinal plan of care) Evaluation of current treatment plan related to  self management and patient's adherence to plan as established by provider   AFIB Interventions: (Status:  Goal on track:  Yes.) Long Term Goal   Counseled on increased risk of stroke due to Afib and benefits of anticoagulation for stroke prevention Reviewed importance of adherence to anticoagulant exactly as prescribed Counseled on bleeding risk associated with Coumadin and importance of self-monitoring for signs/symptoms of bleeding Afib action plan reviewed Reviewed importance on not missing doses  of Coumadin. Reinforced importance of fall safety when taking Coumadin   CAD Interventions: (Status:  Goal on track:  Yes.) Long Term Goal Assessed understanding of CAD diagnosis Medications reviewed including medications utilized in CAD treatment plan Provided education on Importance of limiting foods high in cholesterol Counseled on importance of regular laboratory monitoring as prescribed Reviewed Importance of taking all medications as prescribed Reviewed Importance of attending all scheduled provider appointments Advised to report any changes in symptoms or exercise tolerance   Falls Interventions:  (Status:  Goal on track:  Yes.) Long Term Goal Reviewed medications and discussed potential side effects of medications such as dizziness and frequent urination Advised patient of importance of notifying provider of falls Assessed for falls since last encounter Assessed patients knowledge of fall risk prevention secondary to previously provided education Reviewed to remind to get up slowly. Reinforced to try to have pt use walker when he is at home   Hyperlipidemia Interventions:  (Status:  Condition stable.  Not addressed this visit.) Long Term Goal Medication review performed; medication list updated in electronic medical record.  Provider established cholesterol goals reviewed Counseled on importance of regular laboratory monitoring as prescribed Reviewed role and benefits of statin for ASCVD risk reduction  Dementia:  (Status:  Goal on track:  Yes.)  Long Term Goal Evaluation of current treatment plan related to misuse of: Alzheimer's dementia Reviewed medications including Namenda, Aricept, Emotional Support Provided to patient/caregiver, and Reinforced with caregiver to continue to attend support group. Reinforcedthat pt may have difficulty with new environment when they move and encouraged to look into Adult Day cares in the area when she knows where she located      Heart  Failure Interventions:  (Status:  Goal on track:  Yes.) Long Term Goal Provided education on low sodium diet Reviewed Heart Failure Action Plan in depth and provided written copy Reviewed role of diuretics in prevention of fluid overload and management of heart failure; Discussed the importance of keeping all appointments with provider Reinforced to follow low sodium diet    Patient Goals/Self-Care Activities: Take all medications as prescribed Attend all scheduled provider appointments Call pharmacy for medication refills 3-7 days in advance of running out of medications Call provider office for new concerns or questions  use salt in moderation watch for swelling in feet, ankles and legs every day develop a rescue plan eat more whole grains, fruits and vegetables, lean meats and healthy fats dress right for the weather, hot or cold check pulse (heart) rate once a day make a plan to eat healthy keep all lab appointments take medicine as prescribed call for medicine refill 2 or 3 days before it runs out take all medications exactly as prescribed call doctor with any symptoms you believe are related to your medicine Follow Up Plan:  Telephone follow up appointment with care management team member scheduled for:  03/30/22 The patient has been provided with contact information for the care management team and has been advised to call with any health related questions or concerns.        Plan:Telephone follow up appointment with care management team member scheduled for:  03/30/22 The patient has been provided with contact information for the care management team and has been advised to call with any health related questions or concerns.  Dudley Major RN, Maximiano Coss, CDE Care Management Coordinator [facility] [phone]

## 2022-01-28 NOTE — Patient Instructions (Signed)
Visit Information  Thank you for taking time to visit with me today. Please don't hesitate to contact me if I can be of assistance to you before our next scheduled telephone appointment.  Following are the goals we discussed today:  Take all medications as prescribed Attend all scheduled provider appointments Call pharmacy for medication refills 3-7 days in advance of running out of medications Call provider office for new concerns or questions  use salt in moderation watch for swelling in feet, ankles and legs every day develop a rescue plan eat more whole grains, fruits and vegetables, lean meats and healthy fats dress right for the weather, hot or cold check pulse (heart) rate once a day make a plan to eat healthy keep all lab appointments take medicine as prescribed call for medicine refill 2 or 3 days before it runs out take all medications exactly as prescribed call doctor with any symptoms you believe are related to your medicine  Our next appointment is by telephone on 03/30/22 at 11:30AM  Please call the care guide team at [phone] if you need to cancel or reschedule your appointment.   If you are experiencing a Mental Health or [place] or need someone to talk to, please call the Suicide and Crisis Lifeline: 988 call the [place] National Suicide Prevention Lifeline: 581-574-7115 or TTY: 640-795-0203 TTY (516)859-4208) to talk to a trained counselor call 1-800-273-TALK (toll free, 24 hour hotline) go to [facility] [address] [place] [phone]) call 911   Patient verbalizes understanding of instructions and care plan provided today and agrees to view in [place]. Active MyChart status and patient understanding of how to access instructions and care plan via MyChart confirmed with patient.     Peter Garter RN, Jackquline Denmark, CDE Care Management Coordinator [facility] [phone]

## 2022-02-03 ENCOUNTER — Other Ambulatory Visit: Payer: Self-pay | Admitting: Neurology

## 2022-02-03 DIAGNOSIS — I4891 Unspecified atrial fibrillation: Secondary | ICD-10-CM | POA: Diagnosis not present

## 2022-02-03 DIAGNOSIS — I5032 Chronic diastolic (congestive) heart failure: Secondary | ICD-10-CM | POA: Diagnosis not present

## 2022-02-03 DIAGNOSIS — E785 Hyperlipidemia, unspecified: Secondary | ICD-10-CM

## 2022-02-03 NOTE — Telephone Encounter (Signed)
Patient has follow up for 03/03/2022 with Sharene Butters for follow up

## 2022-02-15 ENCOUNTER — Ambulatory Visit: Payer: Medicare HMO

## 2022-02-17 ENCOUNTER — Ambulatory Visit (INDEPENDENT_AMBULATORY_CARE_PROVIDER_SITE_OTHER): Payer: Medicare HMO

## 2022-02-17 DIAGNOSIS — Z7901 Long term (current) use of anticoagulants: Secondary | ICD-10-CM

## 2022-02-17 LAB — POCT INR: INR: 1.8 — AB (ref 2.0–3.0)

## 2022-02-17 NOTE — Progress Notes (Signed)
Increase dose to take 1 1/2 tablets today and then continue 1/2 tablet daily except take 1 tablet on Mon, Wed, and Fri.   Recheck in 4 weeks at the new home area in [place].   Printed lab INR order for pt to have INR drawn in [place] to send results back to this coumadin clinic until pt is established with a new provider.

## 2022-02-17 NOTE — Patient Instructions (Addendum)
Pre visit review using our clinic review tool, if applicable. No additional management support is needed unless otherwise documented below in the visit note.  Increase dose to take 1 1/2 tablets today and then continue 1/2 tablet daily except take 1 tablet on Mon, Wed, and Fri.   Recheck in 4 weeks

## 2022-02-19 ENCOUNTER — Ambulatory Visit: Payer: Medicare HMO | Admitting: Podiatry

## 2022-02-19 DIAGNOSIS — E1149 Type 2 diabetes mellitus with other diabetic neurological complication: Secondary | ICD-10-CM

## 2022-02-19 DIAGNOSIS — M79674 Pain in right toe(s): Secondary | ICD-10-CM

## 2022-02-19 DIAGNOSIS — B351 Tinea unguium: Secondary | ICD-10-CM

## 2022-02-19 DIAGNOSIS — Z7901 Long term (current) use of anticoagulants: Secondary | ICD-10-CM

## 2022-02-19 DIAGNOSIS — L84 Corns and callosities: Secondary | ICD-10-CM

## 2022-02-19 DIAGNOSIS — M79675 Pain in left toe(s): Secondary | ICD-10-CM | POA: Diagnosis not present

## 2022-02-21 NOTE — Progress Notes (Signed)
Subjective: 81 y.o. returns the office today for painful, elongated, thickened toenails which he cannot trim himself. Denies any redness or drainage around the nails.  Awaiting diabetic shoes.  No new concerns.    PCP: Eulas Post, MD Last Seen: 02/18/2022  Objective: NAD DP/PT pulses palpable, CRT less than 3 seconds Nails hypertrophic, dystrophic, elongated, brittle, discolored 10. There is tenderness overlying the nails 1-5 bilaterally. There is no surrounding erythema or drainage along the nail sites. Hyperkeratotic lesion submetatarsal 1 without any underlying ulceration drainage or signs of infection. Flatfoot present bilaterally with prominent metatarsals plantarly left foot worse than right. No open lesions or pre-ulcerative lesions are identified. No pain with calf compression, swelling, warmth, erythema.  Assessment: Patient presents with symptomatic onychomycosis, hyperkeratotic lesion  Plan: -Treatment options including alternatives, risks, complications were discussed -Nails sharply debrided 10 without complication/bleeding. -Sharp debridement hyperkeratotic lesion without any complications or bleeding. -Wearing diabetic shoes -Discussed daily foot inspection. If there are any changes, to call the office immediately.  -Follow-up in 3 months or sooner if any problems are to arise. In the meantime, encouraged to call the office with any questions, concerns, changes symptoms.  Celesta Gentile, DPM

## 2022-02-23 ENCOUNTER — Telehealth: Payer: Self-pay

## 2022-02-23 NOTE — Telephone Encounter (Signed)
CMN Received - Shoes ordered and casts released from fabrication hold.  

## 2022-02-26 ENCOUNTER — Other Ambulatory Visit: Payer: Self-pay

## 2022-02-26 ENCOUNTER — Encounter: Payer: Self-pay | Admitting: Internal Medicine

## 2022-02-26 MED ORDER — POTASSIUM CHLORIDE CRYS ER 10 MEQ PO TBCR: EXTENDED_RELEASE_TABLET | ORAL | 0 refills | Status: DC

## 2022-02-26 MED ORDER — CARVEDILOL 25 MG PO TABS: ORAL_TABLET | ORAL | 0 refills | Status: DC

## 2022-02-26 MED ORDER — ROSUVASTATIN CALCIUM 10 MG PO TABS: 10.0000 mg | ORAL_TABLET | Freq: Every day | ORAL | 0 refills | Status: DC

## 2022-02-26 MED ORDER — DILTIAZEM HCL 90 MG PO TABS: 90.0000 mg | ORAL_TABLET | Freq: Every day | ORAL | 3 refills | Status: DC

## 2022-02-26 MED ORDER — TORSEMIDE 20 MG PO TABS
ORAL_TABLET | ORAL | 0 refills | Status: DC
Start: 2022-02-26 — End: 2022-06-22

## 2022-03-01 ENCOUNTER — Ambulatory Visit (INDEPENDENT_AMBULATORY_CARE_PROVIDER_SITE_OTHER): Payer: Medicare HMO | Admitting: Family Medicine

## 2022-03-01 ENCOUNTER — Encounter: Payer: Self-pay | Admitting: Family Medicine

## 2022-03-01 VITALS — BP 100/66 | HR 63 | Temp 97.4°F | Ht 73.0 in | Wt 217.1 lb

## 2022-03-01 DIAGNOSIS — E782 Mixed hyperlipidemia: Secondary | ICD-10-CM

## 2022-03-01 DIAGNOSIS — E119 Type 2 diabetes mellitus without complications: Secondary | ICD-10-CM

## 2022-03-01 LAB — POCT GLYCOSYLATED HEMOGLOBIN (HGB A1C): Hemoglobin A1C: 5.8 % — AB (ref 4.0–5.6)

## 2022-03-02 ENCOUNTER — Ambulatory Visit: Payer: Self-pay

## 2022-03-02 DIAGNOSIS — R413 Other amnesia: Secondary | ICD-10-CM

## 2022-03-02 DIAGNOSIS — I480 Paroxysmal atrial fibrillation: Secondary | ICD-10-CM

## 2022-03-02 NOTE — Chronic Care Management (AMB) (Signed)
Chronic Care Management   CCM RN Visit Note  03/02/2022 Name: NESBITT GANDIA MRN: [id number] DOB: [birth year]  Subjective: Collin [name] is a 81 y.o. year old male who is a primary care patient of Burchette, Elberta Fortis, MD. The care management team was consulted for assistance with disease management and care coordination needs.    Engaged with patient by telephone for follow up visit in response to provider referral for case management and/or care coordination services.   Consent to Services:  The patient was given information about Chronic Care Management services, agreed to services, and gave verbal consent prior to initiation of services.  Please see initial visit note for detailed documentation.   Patient agreed to services and verbal consent obtained.   Assessment: Review of patient past medical history, allergies, medications, health status, including review of consultants reports, laboratory and other test data, was performed as part of comprehensive evaluation and provision of chronic care management services.   SDOH (Social Determinants of Health) assessments and interventions performed:    CCM Care Plan  Allergies  Allergen Reactions   Niacin Other (See Comments)   Niaspan [Niacin Er] Other (See Comments)    FLUSHED    Outpatient Encounter Medications as of 03/02/2022  Medication Sig   acetaminophen (TYLENOL) 500 MG tablet Take 1,000 mg by mouth daily as needed for moderate pain.    ammonium lactate (AMLACTIN) 12 % lotion Apply 1 application. topically as needed for dry skin.   carvedilol (COREG) 25 MG tablet TAKE ONE TABLET BY MOUTH TWICE A DAY.   Cholecalciferol (VITAMIN D3) 50 MCG (2000 UT) TABS Take 1 tablet by mouth daily.   diltiazem (CARDIZEM) 90 MG tablet Take 1 tablet (90 mg total) by mouth daily.   donepezil (ARICEPT) 10 MG tablet Take 1 tablet (10 mg total) by mouth daily.   memantine (NAMENDA) 10 MG tablet Take 1 tablet (10 mg total) by mouth 2 (two) times  daily.   PARoxetine (PAXIL) 20 MG tablet TAKE ONE TABLET BY MOUTH DAILY   polyethylene glycol (MIRALAX) packet Take 17 g by mouth daily as needed. Available OTC   potassium chloride (KLOR-CON M) 10 MEQ tablet Take one tablet daily.   rosuvastatin (CRESTOR) 10 MG tablet Take 1 tablet (10 mg total) by mouth daily.   torsemide (DEMADEX) 20 MG tablet TAKE 1.5 TABLETS BY MOUTH DAILY ALTERNATING WITH ONE TABLET EVERY THIRD DAY   warfarin (COUMADIN) 5 MG tablet TAKE 1/2 TABLET (2.5 MG) DAILY BY MOUTH EXCEPT TAKE 1 TABLET (5 MG) ON MONDAYS, WEDNESDAYS AND FRIDAYS OR AS DIRECTED BY ANTICOAGULATION CLINIC   No facility-administered encounter medications on file as of 03/02/2022.    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) 09/04/2021   Memory loss 04/17/2021   Facial swelling 02/19/2021   Odontogenic infection of jaw 02/19/2021   Dental abscess 02/19/2021   Osteoarthritis of both hands 09/19/2019   Malnutrition of moderate degree 11/18/2017   Supratherapeutic INR 11/14/2017   AKI (acute kidney injury) (HCC) 11/12/2017   Vein disorder    Umbilical hernia    Shingles    History of radiation therapy    History of echocardiogram    Heart disease    Claustrophobia    CHF (congestive heart failure) (HCC)    Cancer (HCC)    AICD (automatic cardioverter/defibrillator) present    History of head and neck cancer 07/25/2017   Long term (current) use  of anticoagulants 06/22/2017   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   Thrush, oral 12/21/2016   Weight loss, abnormal 12/21/2016   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   Umbilical hernia without obstruction and without gangrene    Pressure injury of skin 11/14/2016   Tongue mass: Per Ct neck 11/10/2016 11/12/2016   SBO (small bowel obstruction) (HCC) 11/11/2016   Encounter for therapeutic drug monitoring 10/25/2013   Diabetic  peripheral neuropathy (HCC) 10/14/2011   Hypotension 08/20/2011   Acute renal failure (HCC) 08/20/2011   OSA (obstructive sleep apnea) 04/15/2011   ICD-[employer] 03/30/2011   HFimpEF (heart failure with improved EF) 03/30/2011   Persistent atrial fibrillation (HCC) 12/14/2010   Nonischemic cardiomyopathy (HCC) 12/14/2010   Type 2 diabetes mellitus without complication, without long-term current use of insulin (HCC) 12/14/2010   Mixed hyperlipidemia 12/14/2010   Cardiomyopathy, nonischemic (HCC) 09/06/1996    Conditions to be addressed/monitored:Atrial Fibrillation, CAD, and Dementia  Care Plan : RN Care Manager Plan of Care  Updates made by Yetta Glassman, RN since 03/02/2022 12:00 AM  Completed 03/02/2022   Problem: Chronic Disease Management and Care Coordination Needs (A fib, CAD, Vtach, CHF, HLD, dementia, falls) Resolved 03/02/2022  Priority: High     Long-Range Goal: Establish Plan of Care for Chronic Disease Management Needs (A fib, CAD, Vtach, CHF, HLD, dementia, falls) Completed 03/02/2022  Start Date: 07/28/2021  Expected End Date: 05/23/2022  Priority: High  Note:   Case closed goals met and moving out of the area Current Barriers:  Care Coordination needs related to Memory Deficits Chronic Disease Management support and education needs related to Atrial Fibrillation, CHF, CAD, HLD, Dementia, and falls Wife states that pt has been doing good.  States he is using his walker at the day center and has not had any falls.  States he is doing good at his memory day care and he likes going there.   Denies any chest pains, swelling or shortness of breath.  States he saw the podiatrist and he is going to get diabetic shoes. Reports pt is eating well and she tries to give him a healthy diet. Denies any bleeding or bruising   States she is coping with caring for pt at this time and she is involved with a caregiver support group that helps.  Continues to decline LCSW referral.   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  [place] her son is going to move. States that their house is going on the market today.  RNCM Clinical Goal(s):  Patient will verbalize understanding of plan for management of Atrial Fibrillation, CHF, CAD, HLD, Dementia, and falls as evidenced by voiced adherence to plan of care verbalize basic understanding of  Atrial Fibrillation, CHF, CAD, HLD, Dementia, and falls disease process and self health management plan as evidenced by voiced understanding and teach back take all medications exactly as prescribed and will call provider for medication related questions as evidenced by dispense report and pt verbalization attend all scheduled medical appointments: Coumadin clinic 02/15/22, Neurology 03/03/22 as evidenced by medical records demonstrate Ongoing adherence to prescribed treatment plan for Atrial Fibrillation, CHF, CAD, HLD, Dementia, and falls  as evidenced by readings within limits and voices adherence of plan of care  through collaboration with RN Care manager, provider, and care team.   Interventions: 1:1 collaboration with primary care provider regarding development and  update of comprehensive plan of care as evidenced by provider attestation and co-signature Inter-disciplinary care team collaboration (see longitudinal plan of care) Evaluation of current treatment plan related to  self management and patient's adherence to plan as established by provider   AFIB Interventions: (Status:  Goal Met.) Long Term Goal   Counseled on increased risk of stroke due to Afib and benefits of anticoagulation for stroke prevention Reviewed importance of adherence to anticoagulant exactly as prescribed Counseled on bleeding risk associated with Coumadin and importance of self-monitoring for signs/symptoms of bleeding Afib action plan reviewed Reviewed importance  on not missing doses of Coumadin. Reinforced importance of fall safety when taking Coumadin   CAD Interventions: (Status:  Goal Met.) Long Term Goal Assessed understanding of CAD diagnosis Medications reviewed including medications utilized in CAD treatment plan Provided education on Importance of limiting foods high in cholesterol Counseled on importance of regular laboratory monitoring as prescribed Reviewed Importance of taking all medications as prescribed Reviewed Importance of attending all scheduled provider appointments Advised to report any changes in symptoms or exercise tolerance   Falls Interventions:  (Status:  Goal Met.) Long Term Goal Reviewed medications and discussed potential side effects of medications such as dizziness and frequent urination Advised patient of importance of notifying provider of falls Assessed for falls since last encounter Assessed patients knowledge of fall risk prevention secondary to previously provided education Reviewed to remind to get up slowly. Reinforced to try to have pt use walker when he is at home   Hyperlipidemia Interventions:  (Status:  Goal Met.) Long Term Goal Medication review performed; medication list updated in electronic medical record.  Provider established cholesterol goals reviewed Counseled on importance of regular laboratory monitoring as prescribed Reviewed role and benefits of statin for ASCVD risk reduction  Dementia:  (Status:  Goal Met.)  Long Term Goal Evaluation of current treatment plan related to misuse of: Alzheimer's dementia Reviewed medications including Namenda, Aricept, Emotional Support Provided to patient/caregiver, and Reinforced with caregiver to continue to attend support group. Reinforcedthat pt may have difficulty with new environment when they move and encouraged to look into Adult Day cares in the area when she knows where she located      Heart Failure Interventions:  (Status:  Goal Met.) Long  Term Goal Provided education on low sodium diet Reviewed Heart Failure Action Plan in depth and provided written copy Reviewed role of diuretics in prevention of fluid overload and management of heart failure; Discussed the importance of keeping all appointments with provider Reinforced to follow low sodium diet    Patient Goals/Self-Care Activities: Take all medications as prescribed Attend all scheduled provider appointments Call pharmacy for medication refills 3-7 days in advance of running out of medications Call provider office for new concerns or questions  use salt in moderation watch for swelling in feet, ankles and legs every day develop a rescue plan eat more whole grains, fruits and vegetables, lean meats and healthy fats dress right for the weather, hot or cold check pulse (heart) rate once a day make a plan to eat healthy keep all lab appointments take medicine as prescribed call for medicine refill 2 or 3 days before it runs out take all medications exactly as prescribed call doctor with any symptoms you believe are related to your medicine Follow Up Plan:  The patient has been provided with contact information for the care management team and has been advised to call with any health related questions or concerns.  No further  follow up required: Case closed goals met and moving out of the area        Plan:The patient has been provided with contact information for the care management team and has been advised to call with any health related questions or concerns.  No further follow up required: Case closed goals met and moving out of the area Dudley Major RN, [place], CDE Care Management Coordinator [facility] [phone]

## 2022-03-03 ENCOUNTER — Telehealth: Payer: Self-pay | Admitting: Physician Assistant

## 2022-03-03 ENCOUNTER — Ambulatory Visit: Payer: Medicare HMO | Admitting: Physician Assistant

## 2022-03-03 ENCOUNTER — Encounter: Payer: Self-pay | Admitting: Physician Assistant

## 2022-03-03 VITALS — BP 96/69 | HR 70 | Resp 18 | Ht 72.0 in | Wt 217.0 lb

## 2022-03-03 DIAGNOSIS — R69 Illness, unspecified: Secondary | ICD-10-CM | POA: Diagnosis not present

## 2022-03-03 DIAGNOSIS — F03B18 Unspecified dementia, moderate, with other behavioral disturbance: Secondary | ICD-10-CM | POA: Diagnosis not present

## 2022-03-03 MED ORDER — DONEPEZIL HCL 10 MG PO TABS
10.0000 mg | ORAL_TABLET | Freq: Every day | ORAL | 4 refills | Status: DC
Start: 2022-03-03 — End: 2022-04-22

## 2022-03-03 MED ORDER — MEMANTINE HCL 10 MG PO TABS
10.0000 mg | ORAL_TABLET | Freq: Two times a day (BID) | ORAL | 4 refills | Status: DC
Start: 2022-03-03 — End: 2022-04-22

## 2022-03-03 MED ORDER — PAROXETINE HCL 20 MG PO TABS
20.0000 mg | ORAL_TABLET | Freq: Every day | ORAL | 0 refills | Status: DC
Start: 2022-03-03 — End: 2022-06-15

## 2022-03-03 MED ORDER — PAROXETINE HCL 20 MG PO TABS
20.0000 mg | ORAL_TABLET | Freq: Every day | ORAL | 0 refills | Status: DC
Start: 2022-03-03 — End: 2022-03-03

## 2022-03-03 NOTE — Telephone Encounter (Signed)
Patient wife called and wants to know the website or the name of the caregiver group that has the videos  on it. She said that sara mentioned it to her today at his visit  please call

## 2022-03-03 NOTE — Telephone Encounter (Signed)
[employer] is the site. Will call the patient.

## 2022-03-03 NOTE — Progress Notes (Signed)
Assessment/Plan:   Moderate dementia with behavioral disturbance   Collin [name] is a 81 y.o. RH male with a history of PAF on AC with Coumadin, CDCHF,  NICM s/p AICD, cancer of the tongue s/p XRT 2019 with recent presentation to the hospital  for odontogenic cellulitis and subperiosteal abscess L maxilla, recent CoVID (08/09/2021)  and moderate dementia likely due to Alzheimer's disease seen today in follow up. Last MMSE 02/27/21 was 17/ 30. Patient is currently on Donepezil 10 mg daily and Memantine 10 mg daily.  Overall, the patient is clinically stable.  He is moving into [employer], and is in the process of finding continuation of care.  Has been a pleasure caring for this nice patient.   Recommendations:    Continue donepezil 10 mg daily, memantine 10 mg twice daily, side effects were discussed.   Continue Lexapro 20 mg daily  Recommend to continue a program similar to that of [facility] seen here, for social involvement, which is beneficial to this patient. Follow-up as needed  Case discussed with Dr. Delice Lesch who agrees with the plan     Subjective:    Collin [name] is a very pleasant 81 y.o. RH male  seen today in follow up for memory loss. This patient is accompanied in the office by his wife who supplements the history.  Previous records as well as any outside records available were reviewed prior to todays visit.  Patient was last seen at our office on 09/04/2021.  Last MMSE was 17/30. Any changes in memory since last visit?  "Maybe a little worse "as reported by wife.  He likes to go to [facility] were he has "human interaction".  He likes to do activities there.  His wife states that he is only nice there, because when he comes home his grumpy. Patient lives with: Spouse, they are moving to [place] to a small condominium for comfort and to be near family.   Repeats oneself?  Endorsed Disoriented when walking into a room?  Patient denies   Leaving objects in  unusual places?  Patient denies   Ambulates  with difficulty?   He uses a cane for stability.  A cane has been designed to buy him.   Recent falls?  Patient denies   Any head injuries?  Patient denies   History of seizures?   Patient denies   Wandering behavior?  Patient denies   Patient drives?   Patient no longer drives  Any mood changes such irritability agitation?  Patient denies   Any history of depression?:  Patient denies   Hallucinations?  Patient denies   Paranoia?  Patient denies   Patient reports that he sleeps well without vivid dreams, REM behavior or sleepwalking    History of sleep apnea?  Patient denies   Any hygiene concerns?  She is not sure when he brushes his teeth, and changes his clothes.  He refuses to shower. Independent of bathing and dressing?  Endorsed  Does the patient needs help with medications?  Wife is in charge  who is in charge of the finances?  Wife is in charge Any changes in appetite?  Patient denies   Patient have trouble swallowing? Patient denies   Does the patient cook?  Patient denies   Any kitchen accidents such as leaving the stove on? Patient denies   Any headaches?  Patient denies   The double vision? Patient denies   Any focal numbness or tingling?  Patient denies  Chronic back pain Patient denies   Unilateral weakness?  Patient denies   Any tremors?  Patient denies   Any history of anosmia?  Patient denies   Any incontinence of urine?  He is incontinent of urine Any bowel dysfunction?   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 He started noticing changes around a year ago, soon after diagnosis of cancer. He describes his memory as "terrible," he  would go upstairs and stop in the hallway forgetting what he went for. He could not find his eyeglasses and got very frustrated, berating himself for 40 minutes, calling himself stupid. His wife reports minor memory issues for a few years, but more noticeable in the past year. He used to manage his own medications, but at that time she would find full bottles of medications that should have been empty. She took over medications and now fixes his pills weekly. He states he does not drive as much and denies getting lost driving, his wife corrects him and reminds him he stopped driving November 2637 when he was having episodes with his ICD and was told not to drive. She feels he would have gotten lost, she always gives him instructions driving and he would panic if he did not know where he was. The other day at home he could not find their drinking glasses and was opening all the cabinets in the kitchen. He repeats stories and questions. He is independent with dressing and bathing, but his wife is never sure if he brushes his teeth, he gets mad if she reminds him. She states "he is a good actor." She reports he is friendly to everyone, talking to strangers, but once they get into the car, "he is a grump, angry old man," taking it out on her all the time. She reports since the cancer diagnosis, things have been a lot worse, she thinks he is depressed. He states he is frustrated because he cannot do a lot of things he used to do.   He gets dizzy a lot when standing and has to get his bearings first. He has missteps and falls, he fell yesterday and had abrasions on his left arm. His vision is blurred. He has had swallowing difficulties since the surgery and radiation. He has a feeding tube but mostly is able to swallow by mouth now. No neck pain, some back discomfort. He gets left arm numbness when sitting for a long time. He has swelling on his left ankle, his wife reports it is "collapsed" for years, but he has never  been evaluated for it. He has constipation, no incontinence, no anosmia or tremors. His father had memory issues. No history of significant head injuries or alcohol use. He finished up to the 9th grade.   Diagnostic Data:  I personally reviewed head CT with and without contrast done July 2019 which did not show any acute changes, there was mild diffuse atrophy. TSH and B12 normal.    PREVIOUS MEDICATIONS:   CURRENT MEDICATIONS:  Outpatient Encounter Medications as of 03/03/2022  Medication Sig   acetaminophen (TYLENOL) 500 MG tablet Take 1,000 mg by mouth daily as needed for moderate pain.    ammonium lactate (AMLACTIN) 12 % lotion Apply 1 application. topically as  needed for dry skin.   carvedilol (COREG) 25 MG tablet TAKE ONE TABLET BY MOUTH TWICE A DAY.   Cholecalciferol (VITAMIN D3) 50 MCG (2000 UT) TABS Take 1 tablet by mouth daily.   diltiazem (CARDIZEM) 90 MG tablet Take 1 tablet (90 mg total) by mouth daily.   donepezil (ARICEPT) 10 MG tablet Take 1 tablet (10 mg total) by mouth daily.   memantine (NAMENDA) 10 MG tablet Take 1 tablet (10 mg total) by mouth 2 (two) times daily.   PARoxetine (PAXIL) 20 MG tablet Take 1 tablet (20 mg total) by mouth daily.   polyethylene glycol (MIRALAX) packet Take 17 g by mouth daily as needed. Available OTC   potassium chloride (KLOR-CON M) 10 MEQ tablet Take one tablet daily.   rosuvastatin (CRESTOR) 10 MG tablet Take 1 tablet (10 mg total) by mouth daily.   torsemide (DEMADEX) 20 MG tablet TAKE 1.5 TABLETS BY MOUTH DAILY ALTERNATING WITH ONE TABLET EVERY THIRD DAY   warfarin (COUMADIN) 5 MG tablet TAKE 1/2 TABLET (2.5 MG) DAILY BY MOUTH EXCEPT TAKE 1 TABLET (5 MG) ON MONDAYS, WEDNESDAYS AND FRIDAYS OR AS DIRECTED BY ANTICOAGULATION CLINIC   [DISCONTINUED] donepezil (ARICEPT) 10 MG tablet Take 1 tablet (10 mg total) by mouth daily.   [DISCONTINUED] memantine (NAMENDA) 10 MG tablet Take 1 tablet (10 mg total) by mouth 2 (two) times daily.    [DISCONTINUED] PARoxetine (PAXIL) 20 MG tablet TAKE ONE TABLET BY MOUTH DAILY   [DISCONTINUED] PARoxetine (PAXIL) 20 MG tablet Take 1 tablet (20 mg total) by mouth daily.   No facility-administered encounter medications on file as of 03/03/2022.       02/26/2021    3:00 PM 02/20/2020    3:00 PM 09/22/2018    3:00 PM  MMSE - Mini Mental State Exam  Orientation to time '2 1 2  '$ Orientation to Place '2 2 4  '$ Registration '3 3 3  '$ Attention/ Calculation 0 0 0  Recall '2 1 2  '$ Language- name 2 objects '2 2 2  '$ Language- repeat '1 1 1  '$ Language- follow 3 step command '3 2 3  '$ Language- read & follow direction '1 1 1  '$ Write a sentence 0 1 1  Copy design '1 1 1  '$ Total score '17 15 20      '$ 04/04/2019    4:00 PM  Montreal Cognitive Assessment   Visuospatial/ Executive (0/5) 1  Naming (0/3) 3  Attention: Read list of digits (0/2) 2  Attention: Read list of letters (0/1) 1  Attention: Serial 7 subtraction starting at 100 (0/3) 0  Language: Repeat phrase (0/2) 1  Language : Fluency (0/1) 0  Abstraction (0/2) 1  Delayed Recall (0/5) 4  Orientation (0/6) 1  Total 14  Adjusted Score (based on education) 15    Objective:     PHYSICAL EXAMINATION:    VITALS:   Vitals:   03/03/22 0940  BP: 96/69  Pulse: 70  Resp: 18  SpO2: 97%  Weight: 217 lb (98.4 kg)  Height: 6' (1.829 m)    GEN:  The patient appears stated age and is in NAD. HEENT:  Normocephalic, atraumatic.   Neurological examination:  General: NAD, well-groomed, appears stated age. Orientation: The patient is alert. Oriented to person, place, not to date Cranial nerves: There is good facial symmetry.The speech is fluent and clear. No aphasia or dysarthria. Fund of knowledge is reduced recent and remote memory are impaired. Attention and concentration are reduced.  Able to name objects and  repeat phrases.  Hearing is intact to conversational tone.    Sensation: Sensation is intact to light touch throughout Motor: Strength is at least  antigravity x4. Tremors: none  DTR's 2/4 in UE/LE     Movement examination: Tone: There is normal tone in the UE/LE Abnormal movements:  no tremor.  No myoclonus.  No asterixis.   Coordination:  There is no decremation with Collin's. Normal finger to nose  Gait and Station: The patient has difficulty arising out of a deep-seated chair without the use of the hands.  He needs a cane.  The patient's stride length is fair.  Gait is cautious and narrow.    Thank you for allowing [place] the opportunity to participate in the care of this nice patient. Please do not hesitate to contact us for any questions or concerns.   Total time spent on today's visit was 40 minutes dedicated to this patient today, preparing to see patient, examining the patient, ordering tests and/or medications and counseling the patient, documenting clinical information in the EHR or other health record, independently interpreting results and communicating results to the patient/family, discussing treatment and goals, answering patient's questions and coordinating care.  Cc:  Eulas Post, MD  Sharene Butters 03/05/2022 12:18 PM

## 2022-03-03 NOTE — Telephone Encounter (Signed)
Advised 

## 2022-03-03 NOTE — Patient Instructions (Signed)
It was a pleasure to see you today at our office.   Recommendations:  Follow up as needed Continue donepezil 10 mg daily. Side effects were discussed  Continue Memantine 10 mg twice daily.Side effects were discussed  Lexapro 20 mg daily   Whom to call:  Memory  decline, memory medications: Call our office [phone]   For psychiatric meds, mood meds: Please have your primary care physician manage these medications.   Counseling regarding caregiver distress, including caregiver depression, anxiety and issues regarding community resources, adult day care programs, adult living facilities, or memory care questions:   Feel free to contact [place], Social Worker at [phone]   For assessment of decision of mental capacity and competency:  Call Dr. Anthoney Harada, geriatric psychiatrist at [phone]  For guidance in geriatric dementia issues please call [facility] [phone]  For guidance regarding [facility] and if placement were needed at the facility, contact Arnell Asal, Social Worker tel: [phone]  If you have any severe symptoms of a stroke, or other severe issues such as confusion,severe chills or fever, etc call 911 or go to the ER as you may need to be evaluated further   Feel free to visit Facebook page " [email or web address]" for tips of how to care for people with memory problems.      RECOMMENDATIONS FOR ALL PATIENTS WITH MEMORY PROBLEMS: 1. Continue to exercise (Recommend 30 minutes of walking everyday, or 3 hours every week) 2. Increase social interactions - continue going to [place] and enjoy social gatherings with friends and family 3. Eat healthy, avoid fried foods and eat more fruits and vegetables 4. Maintain adequate blood pressure, blood sugar, and blood cholesterol level. Reducing the risk of stroke and cardiovascular disease also helps promoting better memory. 5. Avoid stressful situations. Live a simple life and  avoid aggravations. Organize your time and prepare for the next day in anticipation. 6. Sleep well, avoid any interruptions of sleep and avoid any distractions in the bedroom that may interfere with adequate sleep quality 7. Avoid sugar, avoid sweets as there is a strong link between excessive sugar intake, diabetes, and cognitive impairment We discussed the Mediterranean diet, which has been shown to help patients reduce the risk of progressive memory disorders and reduces cardiovascular risk. This includes eating fish, eat fruits and green leafy vegetables, nuts like almonds and hazelnuts, walnuts, and also use olive oil. Avoid fast foods and fried foods as much as possible. Avoid sweets and sugar as sugar use has been linked to worsening of memory function.  There is always a concern of gradual progression of memory problems. If this is the case, then we may need to adjust level of care according to patient needs. Support, both to the patient and caregiver, should then be put into place.    The Alzheimer's Association is here all day, every day for people facing Alzheimer's disease through our free 24/7 Helpline: [phone]. The Helpline provides reliable information and support to all those who need assistance, such as individuals living with memory loss, Alzheimer's or other dementia, caregivers, health care professionals and the public.  Our highly trained and knowledgeable staff can help you with: Understanding memory loss, dementia and Alzheimer's  Medications and other treatment options  General information about aging and brain health  Skills to provide quality care and to find the best care from professionals  Legal, financial and living-arrangement decisions Our Helpline also features: Confidential care consultation provided by [facility] level clinicians  who can help with decision-making support, crisis assistance and education on issues families face every day  Help in a caller's  preferred language using our translation service that features more than 200 languages and dialects  Referrals to local community programs, services and ongoing support     FALL PRECAUTIONS: Be cautious when walking. Scan the area for obstacles that may increase the risk of trips and falls. When getting up in the mornings, sit up at the edge of the bed for a few minutes before getting out of bed. Consider elevating the bed at the head end to avoid drop of blood pressure when getting up. Walk always in a well-lit room (use night lights in the walls). Avoid area rugs or power cords from appliances in the middle of the walkways. Use a walker or a cane if necessary and consider physical therapy for balance exercise. Get your eyesight checked regularly.  FINANCIAL OVERSIGHT: Supervision, especially oversight when making financial decisions or transactions is also recommended.  HOME SAFETY: Consider the safety of the kitchen when operating appliances like stoves, microwave oven, and blender. Consider having supervision and share cooking responsibilities until no longer able to participate in those. Accidents with firearms and other hazards in the house should be identified and addressed as well.   ABILITY TO BE LEFT ALONE: If patient is unable to contact 911 operator, consider using LifeLine, or when the need is there, arrange for someone to stay with patients. Smoking is a fire hazard, consider supervision or cessation. Risk of wandering should be assessed by caregiver and if detected at any point, supervision and safe proof recommendations should be instituted.  MEDICATION SUPERVISION: Inability to self-administer medication needs to be constantly addressed. Implement a mechanism to ensure safe administration of the medications.   DRIVING: Regarding driving, in patients with progressive memory problems, driving will be impaired. We advise to have someone else do the driving if trouble finding  directions or if minor accidents are reported. Independent driving assessment is available to determine safety of driving.   If you are interested in the driving assessment, you can contact the following:  [employer] in [place]  [place] [phone] or [phone]      [place] refers to food and lifestyle choices that are based on the traditions of countries located on the [employer]. This way of eating has been shown to help prevent certain conditions and improve outcomes for people who have chronic diseases, like kidney disease and heart disease. What are tips for following this plan? Lifestyle  Cook and eat meals together with your family, when possible. Drink enough fluid to keep your urine clear or pale yellow. Be physically active every day. This includes: Aerobic exercise like running or swimming. Leisure activities like gardening, walking, or housework. Get 7-8 hours of sleep each night. If recommended by your health care provider, drink red wine in moderation. This means 1 glass a day for nonpregnant women and 2 glasses a day for men. A glass of wine equals 5 oz (150 mL). Reading food labels  Check the serving size of packaged foods. For foods such as rice and pasta, the serving size refers to the amount of cooked product, not dry. Check the total fat in packaged foods. Avoid foods that have saturated fat or trans fats. Check the ingredients list for added sugars, such as corn syrup. Shopping  At the grocery  store, buy most of your food from the areas near the walls of the store. This includes: Fresh fruits and vegetables (produce). Grains, beans, nuts, and seeds. Some of these may be available in unpackaged forms or large amounts (in bulk). Fresh seafood. Poultry and eggs. Low-fat dairy products. Buy whole  ingredients instead of prepackaged foods. Buy fresh fruits and vegetables in-season from local farmers markets. Buy frozen fruits and vegetables in resealable bags. If you do not have access to quality fresh seafood, buy precooked frozen shrimp or canned fish, such as tuna, salmon, or sardines. Buy small amounts of raw or cooked vegetables, salads, or olives from the deli or salad bar at your store. Stock your pantry so you always have certain foods on hand, such as olive oil, canned tuna, canned tomatoes, rice, pasta, and beans. Cooking  Cook foods with extra-virgin olive oil instead of using butter or other vegetable oils. Have meat as a side dish, and have vegetables or grains as your main dish. This means having meat in small portions or adding small amounts of meat to foods like pasta or stew. Use beans or vegetables instead of meat in common dishes like chili or lasagna. Experiment with different cooking methods. Try roasting or broiling vegetables instead of steaming or sauteing them. Add frozen vegetables to soups, stews, pasta, or rice. Add nuts or seeds for added healthy fat at each meal. You can add these to yogurt, salads, or vegetable dishes. Marinate fish or vegetables using olive oil, lemon juice, garlic, and fresh herbs. Meal planning  Plan to eat 1 vegetarian meal one day each week. Try to work up to 2 vegetarian meals, if possible. Eat seafood 2 or more times a week. Have healthy snacks readily available, such as: Vegetable sticks with hummus. Greek yogurt. Fruit and nut trail mix. Eat balanced meals throughout the week. This includes: Fruit: 2-3 servings a day Vegetables: 4-5 servings a day Low-fat dairy: 2 servings a day Fish, poultry, or lean meat: 1 serving a day Beans and legumes: 2 or more servings a week Nuts and seeds: 1-2 servings a day Whole grains: 6-8 servings a day Extra-virgin olive oil: 3-4 servings a day Limit red meat and sweets to only a few servings  a month What are my food choices? Mediterranean diet Recommended Grains: Whole-grain pasta. Brown rice. Bulgar wheat. Polenta. Couscous. Whole-wheat bread. [place]. Vegetables: Artichokes. Beets. Broccoli. Cabbage. Carrots. Eggplant. Green beans. Chard. Kale. Spinach. Onions. Leeks. Peas. Squash. Tomatoes. Peppers. Radishes. Fruits: Apples. Apricots. Avocado. Berries. Bananas. Cherries. Dates. Figs. Grapes. Lemons. Melon. Oranges. Peaches. Plums. Pomegranate. Meats and other protein foods: Beans. Almonds. Sunflower seeds. Pine nuts. Peanuts. [place]. Salmon. Scallops. Shrimp. [place]. Tilapia. Clams. Oysters. Eggs. Dairy: Low-fat milk. Cheese. Greek yogurt. Beverages: Water. Red wine. Herbal tea. Fats and oils: Extra virgin olive oil. Avocado oil. Grape seed oil. Sweets and desserts: [place] yogurt with honey. Baked apples. Poached pears. Trail mix. Seasoning and other foods: Basil. Cilantro. Coriander. Cumin. Mint. Parsley. Sage. Rosemary. Tarragon. Garlic. Oregano. Thyme. Pepper. Balsalmic vinegar. Tahini. Hummus. Tomato sauce. Olives. Mushrooms. Limit these Grains: Prepackaged pasta or rice dishes. Prepackaged cereal with added sugar. Vegetables: Deep fried potatoes (french fries). Fruits: Fruit canned in syrup. Meats and other protein foods: Beef. Pork. Lamb. Poultry with skin. Hot dogs. Berniece Salines. Dairy: Ice cream. Sour cream. Whole milk. Beverages: Juice. Sugar-sweetened soft drinks. Beer. Liquor and spirits. Fats and oils: Butter. Canola oil. Vegetable oil. Beef fat (tallow). Lard. Sweets and desserts: Cookies. Cakes. Pies. Candy.  Seasoning and other foods: Mayonnaise. Premade sauces and marinades. The items listed may not be a complete list. Talk with your dietitian about what dietary choices are right for you. Summary The Mediterranean diet includes both food and lifestyle choices. Eat a variety of fresh fruits and vegetables, beans, nuts, seeds, and whole grains. Limit the amount of  red meat and sweets that you eat. Talk with your health care provider about whether it is safe for you to drink red wine in moderation. This means 1 glass a day for nonpregnant women and 2 glasses a day for men. A glass of wine equals 5 oz (150 mL). This information is not intended to replace advice given to you by your health care provider. Make sure you discuss any questions you have with your health care provider. Document Released: 04/15/2016 Document Revised: 05/18/2016 Document Reviewed: 04/15/2016 Elsevier Interactive Patient Education  2017 [employer].

## 2022-03-19 ENCOUNTER — Telehealth: Payer: Self-pay

## 2022-03-19 NOTE — Telephone Encounter (Signed)
Pt moved out of town and will establish with new provider and another clinic to manage warfarin.   LVM to inquire if pt has set up with new provider.

## 2022-03-23 ENCOUNTER — Other Ambulatory Visit: Payer: Self-pay | Admitting: Family Medicine

## 2022-03-23 DIAGNOSIS — Z7901 Long term (current) use of anticoagulants: Secondary | ICD-10-CM | POA: Diagnosis not present

## 2022-03-23 LAB — POCT INR: INR: 2.5 (ref 2.0–3.0)

## 2022-03-24 ENCOUNTER — Ambulatory Visit (INDEPENDENT_AMBULATORY_CARE_PROVIDER_SITE_OTHER): Payer: Medicare HMO

## 2022-03-24 DIAGNOSIS — Z7901 Long term (current) use of anticoagulants: Secondary | ICD-10-CM | POA: Diagnosis not present

## 2022-03-24 LAB — PROTIME-INR
INR: 2.5 — ABNORMAL HIGH (ref 0.9–1.2)
Prothrombin Time: 25.5 s — ABNORMAL HIGH (ref 9.1–12.0)

## 2022-03-24 NOTE — Telephone Encounter (Signed)
LVM

## 2022-03-24 NOTE — Progress Notes (Signed)
Continue 1/2 tablet daily except take 1 tablet on Mon, Wed, and Fri.   Recheck in 6 weeks at the new home are in [place].  LVM with dosing instructions to remain the same.

## 2022-03-24 NOTE — Patient Instructions (Addendum)
Pre visit review using our clinic review tool, if applicable. No additional management support is needed unless otherwise documented below in the visit note.  Continue 1/2 tablet daily except take 1 tablet on Mon, Wed, and Fri.   Recheck in 6 weeks at the new home are in [place].

## 2022-03-24 NOTE — Telephone Encounter (Signed)
Pt's wife, Clarene Critchley, LVM reporting she took pt to [facility] yesterday and they should be sending results. She also reported she does not have her laptop set up yet and will need any instructions text if changes are being made to warfarin dosing.   [facility] result from yesterday is in the chart and is 2.5  Will f/u with pt's wife.

## 2022-03-30 ENCOUNTER — Telehealth: Payer: Medicare HMO

## 2022-03-30 ENCOUNTER — Ambulatory Visit (INDEPENDENT_AMBULATORY_CARE_PROVIDER_SITE_OTHER): Payer: Medicare HMO

## 2022-03-30 ENCOUNTER — Telehealth: Payer: Self-pay | Admitting: Podiatry

## 2022-03-30 DIAGNOSIS — I428 Other cardiomyopathies: Secondary | ICD-10-CM | POA: Diagnosis not present

## 2022-03-30 LAB — CUP PACEART REMOTE DEVICE CHECK
Battery Remaining Longevity: 102 mo
Battery Remaining Percentage: 88 %
Brady Statistic RV Percent Paced: 23 %
Date Time Interrogation Session: 20230722125500
HighPow Impedance: 38 Ohm
Implantable Lead Implant Date: 20060209
Implantable Lead Location: 753860
Implantable Lead Model: 158
Implantable Lead Serial Number: 156422
Implantable Pulse Generator Implant Date: 20150904
Lead Channel Impedance Value: 359 Ohm
Lead Channel Pacing Threshold Amplitude: 1.1 V
Lead Channel Pacing Threshold Pulse Width: 0.6 ms
Lead Channel Setting Pacing Amplitude: 2.4 V
Lead Channel Setting Pacing Pulse Width: 0.6 ms
Lead Channel Setting Sensing Sensitivity: 0.5 mV
Pulse Gen Serial Number: 191056

## 2022-03-30 NOTE — Telephone Encounter (Signed)
Left message on machine that diabetic shoes are in .

## 2022-04-18 ENCOUNTER — Encounter: Payer: Self-pay | Admitting: Physician Assistant

## 2022-04-19 ENCOUNTER — Other Ambulatory Visit: Payer: Self-pay

## 2022-04-22 ENCOUNTER — Other Ambulatory Visit: Payer: Self-pay

## 2022-04-22 MED ORDER — MEMANTINE HCL 10 MG PO TABS: 10.0000 mg | ORAL_TABLET | Freq: Two times a day (BID) | ORAL | 4 refills | Status: DC

## 2022-04-22 MED ORDER — DONEPEZIL HCL 10 MG PO TABS: 10.0000 mg | ORAL_TABLET | Freq: Every day | ORAL | 4 refills | Status: DC

## 2022-04-27 NOTE — Progress Notes (Signed)
Remote ICD transmission.   

## 2022-05-03 ENCOUNTER — Telehealth: Payer: Self-pay

## 2022-05-03 DIAGNOSIS — Z7689 Persons encountering health services in other specified circumstances: Secondary | ICD-10-CM | POA: Diagnosis not present

## 2022-05-03 DIAGNOSIS — Z7901 Long term (current) use of anticoagulants: Secondary | ICD-10-CM | POA: Diagnosis not present

## 2022-05-03 DIAGNOSIS — C069 Malignant neoplasm of mouth, unspecified: Secondary | ICD-10-CM | POA: Diagnosis not present

## 2022-05-03 DIAGNOSIS — E1169 Type 2 diabetes mellitus with other specified complication: Secondary | ICD-10-CM | POA: Diagnosis not present

## 2022-05-03 DIAGNOSIS — I482 Chronic atrial fibrillation, unspecified: Secondary | ICD-10-CM | POA: Diagnosis not present

## 2022-05-03 NOTE — Telephone Encounter (Signed)
Pt is due for INR check again. Pt has moved out of town but pt's wife took pt to a lab there for the last INR and then had the result faxed to coumadin clinic until pt is established with new provider in their area.   LVM advising to return call.

## 2022-05-04 ENCOUNTER — Telehealth: Payer: Self-pay | Admitting: Internal Medicine

## 2022-05-04 ENCOUNTER — Ambulatory Visit: Payer: Self-pay

## 2022-05-04 DIAGNOSIS — R600 Localized edema: Secondary | ICD-10-CM | POA: Diagnosis not present

## 2022-05-04 NOTE — Telephone Encounter (Signed)
Pt's wife LVM reporting pt has set up with new PCP and they are managing warfarin.   Resolved anticoagulation encounters.

## 2022-05-04 NOTE — Telephone Encounter (Signed)
Wife called stating they have moved to [facility] ([address] [place], [place], [place] [address]) and will no longer need the [place] [facility] appointments.

## 2022-05-04 NOTE — Progress Notes (Signed)
Pt has established with new PCP in [place], [place] and they are managing warfarin.   Resolving anticoagulation episodes.

## 2022-05-06 NOTE — Telephone Encounter (Signed)
Appointments have been canceled and patient will be released in bsx once they find a doctor in [address]

## 2022-06-14 ENCOUNTER — Telehealth: Payer: Self-pay | Admitting: Physician Assistant

## 2022-06-14 NOTE — Telephone Encounter (Signed)
Pt's wife called in stating they had moved and Clarise Cruz had given them several refills to get them through to when he can get established with another Neurologist, but she didn't have one for the paroxetine. She would like it sent to [place], [place]  She also wanted to see if it was possible to do a vitual visit one more time? They are having a hard time finding another neurologist down there.

## 2022-06-15 ENCOUNTER — Other Ambulatory Visit: Payer: Self-pay | Admitting: Physician Assistant

## 2022-06-15 MED ORDER — PAROXETINE HCL 20 MG PO TABS: 20.0000 mg | ORAL_TABLET | Freq: Every day | ORAL | 0 refills | Status: DC

## 2022-06-21 ENCOUNTER — Other Ambulatory Visit: Payer: Self-pay | Admitting: Internal Medicine

## 2022-07-10 ENCOUNTER — Other Ambulatory Visit: Payer: Self-pay | Admitting: Internal Medicine

## 2022-07-13 ENCOUNTER — Ambulatory Visit: Payer: Medicare HMO | Admitting: Internal Medicine

## 2022-07-29 ENCOUNTER — Other Ambulatory Visit: Payer: Self-pay | Admitting: Internal Medicine

## 2022-08-03 ENCOUNTER — Other Ambulatory Visit: Payer: Self-pay | Admitting: Internal Medicine

## 2022-08-04 MED ORDER — POTASSIUM CHLORIDE CRYS ER 10 MEQ PO TBCR: EXTENDED_RELEASE_TABLET | ORAL | 5 refills | Status: DC

## 2022-09-12 ENCOUNTER — Other Ambulatory Visit: Payer: Self-pay | Admitting: Physician Assistant

## 2022-09-15 ENCOUNTER — Other Ambulatory Visit: Payer: Self-pay | Admitting: Internal Medicine

## 2022-11-12 ENCOUNTER — Telehealth: Payer: Self-pay | Admitting: Radiation Oncology

## 2022-11-12 NOTE — Telephone Encounter (Signed)
Medical Record Request received from [facility] for previous records. Request sent to dosimetry for [facility]IC[facility] records.

## 2022-12-06 ENCOUNTER — Other Ambulatory Visit: Payer: Self-pay | Admitting: Internal Medicine

## 2022-12-10 ENCOUNTER — Other Ambulatory Visit: Payer: Self-pay | Admitting: Physician Assistant

## 2022-12-20 ENCOUNTER — Telehealth: Payer: Self-pay | Admitting: Family Medicine

## 2022-12-20 NOTE — Telephone Encounter (Signed)
Contacted Collin [name] to schedule their annual wellness visit. Patient declined to schedule AWV at this time.  Collin [name] AWV direct phone # [phone]   Spoke with patient spouse to schedule AWV.  She stated insurance did AWV in Nov.  She also said patient has moved 4 hours away and they have new providers there Dr Collin [name]

## 2023-01-01 ENCOUNTER — Other Ambulatory Visit: Payer: Self-pay | Admitting: Internal Medicine

## 2023-01-27 ENCOUNTER — Other Ambulatory Visit: Payer: Self-pay | Admitting: Internal Medicine

## 2023-02-07 ENCOUNTER — Other Ambulatory Visit: Payer: Self-pay | Admitting: Physician Assistant

## 2023-02-07 ENCOUNTER — Other Ambulatory Visit: Payer: Self-pay | Admitting: Internal Medicine

## 2023-02-18 ENCOUNTER — Other Ambulatory Visit: Payer: Self-pay | Admitting: Internal Medicine

## 2023-04-17 ENCOUNTER — Other Ambulatory Visit: Payer: Self-pay | Admitting: Internal Medicine

## 2023-04-19 MED ORDER — POTASSIUM CHLORIDE CRYS ER 10 MEQ PO TBCR: EXTENDED_RELEASE_TABLET | ORAL | 0 refills | Status: DC

## 2023-05-23 ENCOUNTER — Other Ambulatory Visit: Payer: Self-pay | Admitting: Internal Medicine

## 2023-06-01 ENCOUNTER — Other Ambulatory Visit: Payer: Self-pay | Admitting: Internal Medicine

## 2024-03-06 DEATH — deceased
